# Patient Record
Sex: Female | Born: 1945
Health system: Southern US, Community
[De-identification: ages and names within clinical notes are randomized; demographics above are authoritative.]

## PROBLEM LIST (undated history)

## (undated) ENCOUNTER — Emergency Department (HOSPITAL_COMMUNITY): Payer: Medicare HMO

## (undated) DIAGNOSIS — I10 Essential (primary) hypertension: Secondary | ICD-10-CM

## (undated) DIAGNOSIS — E119 Type 2 diabetes mellitus without complications: Secondary | ICD-10-CM

## (undated) DIAGNOSIS — N189 Chronic kidney disease, unspecified: Secondary | ICD-10-CM

## (undated) DIAGNOSIS — I679 Cerebrovascular disease, unspecified: Secondary | ICD-10-CM

## (undated) DIAGNOSIS — C801 Malignant (primary) neoplasm, unspecified: Secondary | ICD-10-CM

## (undated) DIAGNOSIS — I499 Cardiac arrhythmia, unspecified: Secondary | ICD-10-CM

## (undated) DIAGNOSIS — M1712 Unilateral primary osteoarthritis, left knee: Secondary | ICD-10-CM

## (undated) DIAGNOSIS — N183 Chronic kidney disease, stage 3 unspecified: Secondary | ICD-10-CM

## (undated) DIAGNOSIS — I2699 Other pulmonary embolism without acute cor pulmonale: Secondary | ICD-10-CM

## (undated) DIAGNOSIS — R011 Cardiac murmur, unspecified: Secondary | ICD-10-CM

## (undated) DIAGNOSIS — G4733 Obstructive sleep apnea (adult) (pediatric): Secondary | ICD-10-CM

## (undated) DIAGNOSIS — N1832 Chronic kidney disease, stage 3b: Secondary | ICD-10-CM

## (undated) DIAGNOSIS — G4730 Sleep apnea, unspecified: Secondary | ICD-10-CM

## (undated) HISTORY — DX: Chronic kidney disease, stage 3 unspecified: N18.30

## (undated) HISTORY — PX: EYE SURGERY: SHX253

## (undated) HISTORY — DX: Cerebrovascular disease, unspecified: I67.9

## (undated) HISTORY — PX: BACK SURGERY: SHX140

## (undated) HISTORY — DX: Chronic kidney disease, stage 3b: N18.32

## (undated) HISTORY — PX: JOINT REPLACEMENT: SHX530

## (undated) HISTORY — PX: OTHER SURGICAL HISTORY: SHX169

## (undated) HISTORY — DX: Other pulmonary embolism without acute cor pulmonale: I26.99

## (undated) HISTORY — DX: Unilateral primary osteoarthritis, left knee: M17.12

## (undated) HISTORY — DX: Type 2 diabetes mellitus without complications: E11.9

## (undated) HISTORY — DX: Essential (primary) hypertension: I10

## (undated) HISTORY — DX: Obstructive sleep apnea (adult) (pediatric): G47.33

## (undated) HISTORY — PX: ABDOMINAL HYSTERECTOMY: SHX81

## (undated) HISTORY — DX: Sleep apnea, unspecified: G47.30

---

## 1999-01-24 ENCOUNTER — Encounter: Admission: RE | Admit: 1999-01-24 | Discharge: 1999-01-24 | Payer: Self-pay | Admitting: Family Medicine

## 1999-01-24 ENCOUNTER — Encounter: Payer: Self-pay | Admitting: Family Medicine

## 2000-09-09 ENCOUNTER — Encounter: Admission: RE | Admit: 2000-09-09 | Discharge: 2000-09-09 | Payer: Self-pay | Admitting: Obstetrics and Gynecology

## 2000-09-09 ENCOUNTER — Encounter: Payer: Self-pay | Admitting: Obstetrics and Gynecology

## 2001-07-14 ENCOUNTER — Encounter: Admission: RE | Admit: 2001-07-14 | Discharge: 2001-07-14 | Payer: Self-pay | Admitting: Family Medicine

## 2001-07-14 ENCOUNTER — Encounter: Payer: Self-pay | Admitting: Family Medicine

## 2004-05-08 ENCOUNTER — Inpatient Hospital Stay (HOSPITAL_COMMUNITY): Admission: RE | Admit: 2004-05-08 | Discharge: 2004-05-12 | Payer: Self-pay | Admitting: Orthopedic Surgery

## 2004-05-28 ENCOUNTER — Emergency Department (HOSPITAL_COMMUNITY): Admission: EM | Admit: 2004-05-28 | Discharge: 2004-05-28 | Payer: Self-pay | Admitting: Emergency Medicine

## 2004-05-29 ENCOUNTER — Ambulatory Visit (HOSPITAL_COMMUNITY): Admission: RE | Admit: 2004-05-29 | Discharge: 2004-05-29 | Payer: Self-pay | Admitting: Orthopedic Surgery

## 2008-12-13 ENCOUNTER — Inpatient Hospital Stay (HOSPITAL_COMMUNITY): Admission: RE | Admit: 2008-12-13 | Discharge: 2008-12-16 | Payer: Self-pay | Admitting: Orthopedic Surgery

## 2008-12-14 ENCOUNTER — Encounter (INDEPENDENT_AMBULATORY_CARE_PROVIDER_SITE_OTHER): Payer: Self-pay | Admitting: Orthopedic Surgery

## 2008-12-14 ENCOUNTER — Ambulatory Visit: Payer: Self-pay | Admitting: Surgery

## 2010-07-11 LAB — URINALYSIS, ROUTINE W REFLEX MICROSCOPIC
Bilirubin Urine: NEGATIVE
Glucose, UA: NEGATIVE mg/dL
Hgb urine dipstick: NEGATIVE
Ketones, ur: NEGATIVE mg/dL
Nitrite: POSITIVE — AB
Protein, ur: NEGATIVE mg/dL
Specific Gravity, Urine: 1.014 (ref 1.005–1.030)
Urobilinogen, UA: 0.2 mg/dL (ref 0.0–1.0)
pH: 6 (ref 5.0–8.0)

## 2010-07-11 LAB — COMPREHENSIVE METABOLIC PANEL
ALT: 21 U/L (ref 0–35)
AST: 26 U/L (ref 0–37)
Albumin: 3.5 g/dL (ref 3.5–5.2)
Alkaline Phosphatase: 52 U/L (ref 39–117)
BUN: 10 mg/dL (ref 6–23)
CO2: 24 mEq/L (ref 19–32)
Calcium: 9.2 mg/dL (ref 8.4–10.5)
Chloride: 105 mEq/L (ref 96–112)
Creatinine, Ser: 0.96 mg/dL (ref 0.4–1.2)
GFR calc Af Amer: >60 mL/min (ref >60)
GFR calc non Af Amer: 59 mL/min — ABNORMAL LOW (ref >60)
Glucose, Bld: 155 mg/dL — ABNORMAL HIGH (ref 70–99)
Potassium: 4 mEq/L (ref 3.5–5.1)
Sodium: 138 mEq/L (ref 135–145)
Total Bilirubin: 0.4 mg/dL (ref 0.3–1.2)
Total Protein: 6 g/dL (ref 6.0–8.3)

## 2010-07-11 LAB — URINE MICROSCOPIC-ADD ON

## 2010-07-11 LAB — BASIC METABOLIC PANEL
BUN: 5 mg/dL — ABNORMAL LOW (ref 6–23)
BUN: 6 mg/dL (ref 6–23)
BUN: 8 mg/dL (ref 6–23)
CO2: 25 mEq/L (ref 19–32)
CO2: 28 mEq/L (ref 19–32)
CO2: 30 mEq/L (ref 19–32)
Calcium: 8.4 mg/dL (ref 8.4–10.5)
Calcium: 8.6 mg/dL (ref 8.4–10.5)
Calcium: 9 mg/dL (ref 8.4–10.5)
Chloride: 100 mEq/L (ref 96–112)
Chloride: 96 mEq/L (ref 96–112)
Chloride: 99 mEq/L (ref 96–112)
Creatinine, Ser: 0.74 mg/dL (ref 0.4–1.2)
Creatinine, Ser: 0.74 mg/dL (ref 0.4–1.2)
Creatinine, Ser: 0.76 mg/dL (ref 0.4–1.2)
GFR calc Af Amer: >60 mL/min (ref >60)
GFR calc Af Amer: >60 mL/min (ref >60)
GFR calc Af Amer: >60 mL/min (ref >60)
GFR calc non Af Amer: >60 mL/min (ref >60)
GFR calc non Af Amer: >60 mL/min (ref >60)
GFR calc non Af Amer: >60 mL/min (ref >60)
Glucose, Bld: 147 mg/dL — ABNORMAL HIGH (ref 70–99)
Glucose, Bld: 162 mg/dL — ABNORMAL HIGH (ref 70–99)
Glucose, Bld: 166 mg/dL — ABNORMAL HIGH (ref 70–99)
Potassium: 4.2 mEq/L (ref 3.5–5.1)
Potassium: 4.3 mEq/L (ref 3.5–5.1)
Potassium: 4.6 mEq/L (ref 3.5–5.1)
Sodium: 131 mEq/L — ABNORMAL LOW (ref 135–145)
Sodium: 133 mEq/L — ABNORMAL LOW (ref 135–145)
Sodium: 135 mEq/L (ref 135–145)

## 2010-07-11 LAB — GLUCOSE, CAPILLARY
Glucose-Capillary: 118 mg/dL — ABNORMAL HIGH (ref 70–99)
Glucose-Capillary: 120 mg/dL — ABNORMAL HIGH (ref 70–99)
Glucose-Capillary: 121 mg/dL — ABNORMAL HIGH (ref 70–99)
Glucose-Capillary: 121 mg/dL — ABNORMAL HIGH (ref 70–99)
Glucose-Capillary: 129 mg/dL — ABNORMAL HIGH (ref 70–99)
Glucose-Capillary: 130 mg/dL — ABNORMAL HIGH (ref 70–99)
Glucose-Capillary: 138 mg/dL — ABNORMAL HIGH (ref 70–99)
Glucose-Capillary: 140 mg/dL — ABNORMAL HIGH (ref 70–99)
Glucose-Capillary: 142 mg/dL — ABNORMAL HIGH (ref 70–99)
Glucose-Capillary: 146 mg/dL — ABNORMAL HIGH (ref 70–99)
Glucose-Capillary: 148 mg/dL — ABNORMAL HIGH (ref 70–99)
Glucose-Capillary: 161 mg/dL — ABNORMAL HIGH (ref 70–99)
Glucose-Capillary: 168 mg/dL — ABNORMAL HIGH (ref 70–99)

## 2010-07-11 LAB — PROTIME-INR
INR: 0.9 (ref 0.00–1.49)
INR: 1.1 (ref 0.00–1.49)
INR: 2.5 — ABNORMAL HIGH (ref 0.00–1.49)
INR: 4 — ABNORMAL HIGH (ref 0.00–1.49)
Prothrombin Time: 11.9 seconds (ref 11.6–15.2)
Prothrombin Time: 14.2 seconds (ref 11.6–15.2)
Prothrombin Time: 26.4 seconds — ABNORMAL HIGH (ref 11.6–15.2)
Prothrombin Time: 38.9 seconds — ABNORMAL HIGH (ref 11.6–15.2)

## 2010-07-11 LAB — CBC
HCT: 27.6 % — ABNORMAL LOW (ref 36.0–46.0)
HCT: 29.1 % — ABNORMAL LOW (ref 36.0–46.0)
HCT: 31.3 % — ABNORMAL LOW (ref 36.0–46.0)
HCT: 38.6 % (ref 36.0–46.0)
Hemoglobin: 10.3 g/dL — ABNORMAL LOW (ref 12.0–15.0)
Hemoglobin: 12.8 g/dL (ref 12.0–15.0)
Hemoglobin: 9.3 g/dL — ABNORMAL LOW (ref 12.0–15.0)
Hemoglobin: 9.7 g/dL — ABNORMAL LOW (ref 12.0–15.0)
MCHC: 32.9 g/dL (ref 30.0–36.0)
MCHC: 33.1 g/dL (ref 30.0–36.0)
MCHC: 33.2 g/dL (ref 30.0–36.0)
MCHC: 33.6 g/dL (ref 30.0–36.0)
MCV: 87.4 fL (ref 78.0–100.0)
MCV: 87.9 fL (ref 78.0–100.0)
MCV: 88.3 fL (ref 78.0–100.0)
MCV: 88.8 fL (ref 78.0–100.0)
Platelets: 209 10*3/uL (ref 150–400)
Platelets: 210 10*3/uL (ref 150–400)
Platelets: 223 10*3/uL (ref 150–400)
Platelets: 285 10*3/uL (ref 150–400)
RBC: 3.16 MIL/uL — ABNORMAL LOW (ref 3.87–5.11)
RBC: 3.28 MIL/uL — ABNORMAL LOW (ref 3.87–5.11)
RBC: 3.55 MIL/uL — ABNORMAL LOW (ref 3.87–5.11)
RBC: 4.39 MIL/uL (ref 3.87–5.11)
RDW: 14.9 % (ref 11.5–15.5)
RDW: 15.3 % (ref 11.5–15.5)
RDW: 15.6 % — ABNORMAL HIGH (ref 11.5–15.5)
RDW: 15.6 % — ABNORMAL HIGH (ref 11.5–15.5)
WBC: 10.5 10*3/uL (ref 4.0–10.5)
WBC: 10.6 10*3/uL — ABNORMAL HIGH (ref 4.0–10.5)
WBC: 11 10*3/uL — ABNORMAL HIGH (ref 4.0–10.5)
WBC: 9.3 10*3/uL (ref 4.0–10.5)

## 2010-07-11 LAB — APTT: aPTT: 37 seconds (ref 24–37)

## 2010-08-22 NOTE — Discharge Summary (Signed)
Lisa Maxwell, Lisa Maxwell                ACCOUNT NO.:  [id number]   MEDICAL RECORD NO.:  [id number]          PATIENT TYPE:  INP   LOCATION:  [place]                         FACILITY:  [facility]   PHYSICIAN:  Metta Clines. Supple, M.D.  DATE OF BIRTH:  06/09/1945   DATE OF ADMISSION:  05/08/2004  DATE OF DISCHARGE:  05/12/2004                                 DISCHARGE SUMMARY   ADMISSION DIAGNOSES:  1.  End-stage osteoarthritis of right knee.  2.  Diet-controlled diabetes.  3.  Reflux.  4.  History of heart murmur.   DISCHARGE DIAGNOSES:  1.  End-stage osteoarthritis of right knee.  2.  Diet-controlled diabetes.  3.  Reflux.  4.  History of heart murmur.  5.  Status post right total knee arthroplasty.  6.  Postoperative hyponatremia, likely volume induced, and this did improve.   OPERATION:  Right total knee arthroplasty. Surgeon Metta Clines. Supple, M.D.  Assistant Olivia Mackie A. Shuford, P.A.-C. under general anesthetic.   BRIEF HISTORY:  Lisa Maxwell is a very pleasant 65 year old female well known  to [place]. Has failed outpatient conservative management including knee  arthroscopy and multiple NSAIDs and injections for known end-stage  osteoarthritis of her right knee. At this time, she is quite miserable and  wishes to proceed with total knee arthroplasty. Risks and benefits  discussed. In spite of her young age at this time, decision was made to  proceed. The risks and benefits include neurovascular injury, bleeding,  infection, need for additional surgeries as well as DVT, PE, and anesthetic  complications were discussed at this time, and she wished to proceed.   HOSPITAL COURSE:  The patient was admitted and underwent the above mentioned  procedure and tolerated thi well. All appropriate IV antibiotics and  analgesics were utilized postoperatively. Postoperatively, she was placed on  Coumadin for DVT and PE prophylaxis. Her first postoperative night, she was  noted to have decreased urine output as  well as some mild rhonchi in her  lung bases. We did get a chest x-ray. She was found to be mildly  hyponatremic as well which was likely volume induced. She did respond nicely  to albuterol nebulizers in regards to her respiratory status. Her chest x-  ray was found to show minimal fluid or atelectasis at that time. Overall,  the patient did extremely well postoperatively. She began working with  therapy and did well. Home health therapy as well as RN case management  became involved. All home needs were met. By date May 12, 2004,  postoperative day #4, she was doing extremely well. Anesthesia was clean and  dry. She was afebrile. She had no specific complaints and had progressed  towards her therapy goal. At this time, she was stable for discharge home to  follow up on an outpatient basis.   LABORATORY DATA:  Shows admission hemogram within normal limits.  Postoperatively, she dropped to 10.8 and 9.4, and the last blood draw prior  to discharge was 10.3. Protimes and INRs followed by pharmacy for DVT and PE  prophylaxis on Coumadin. Chemistries showed admission sodium of 134,  postoperatively 129, the following day up to 133. Other chemistries showed  her glucose to be fluctuating between 143 and 181. She was noted to have a  UTI on date May 05, 2004, positive nitrites, small leukocyte esterase.  She was given the Ancef perioperatively and remained asymptomatic. EKG  showed a right bundle branch block with normal sinus rhythm and frequent  premature supraventricular complexes. There was no tracing to be compared  to.   CONDITION ON DISCHARGE:  Stable and improved.   DISCHARGE MEDICATIONS AND PLAN:  The patient will be discharged to home. She  will be followed up in our office in two weeks ________________ .  Prescriptions have been provided for Percocet, Robaxin, and Coumadin.  [facility] OT/PT will be arranged for home. Resume her other home  medications and diet.  Call for any difficulties.      TAS/MEDQ  D:  07/01/2004  T:  07/01/2004  Job:  [id number]

## 2010-08-22 NOTE — Op Note (Signed)
Lisa Maxwell, OTERO                ACCOUNT NO.:  [id number]   MEDICAL RECORD NO.:  [id number]          PATIENT TYPE:  INP   LOCATION:  X001                         FACILITY:  [facility]   PHYSICIAN:  Metta Clines. Supple, M.D.  DATE OF BIRTH:  [birth year]   DATE OF PROCEDURE:  05/08/2004  DATE OF DISCHARGE:                                 OPERATIVE REPORT   PREOPERATIVE DIAGNOSIS:  End-stage right knee osteoarthrosis.   POSTOPERATIVE DIAGNOSIS:  End-stage right knee osteoarthrosis.   PROCEDURE:  Cemented right [name] posterior stabilized total knee  implant with a #2 femur, #2 tibia, a 32-mm patella, and a 12.5-mm rotating  platform polyethylene insert.   SURGEON:  Metta Clines. Supple, M.D.   Terrence DupontOlivia Mackie A. Shuford, P.A.-C.   ANESTHESIA:  General endotracheal.   TOURNIQUET TIME:  1 hour and 7 minutes.   ESTIMATED BLOOD LOSS:  250 cc.   DRAINS:  Hemovac x1.   INDICATIONS FOR PROCEDURE:  Lisa Maxwell is a 65 year old female who has had  chronic right knee pain related to end-stage arthrosis which has been  refractory to prolonged attempts at conservative management.  Due to her  increasing pain and functional limitations, she is brought to the operating  room at this time for planned right total knee arthroplasty, as described  below.   Preoperatively, [name] was counseled on the treatment options as well  as risks versus benefits thereof.  Possible surgical complications of  bleeding, infection, neurovascular injury, DVT, PE, persistence of pain,  loss of motion, and potential need for revision of the implant were all  reviewed.  She understands and accepts and agrees with our planned  procedure.   DESCRIPTION OF PROCEDURE:  After undergoing routine preoperative evaluation,  the patient received prophylactic antibiotics.  She was placed supine on the  operating table and underwent smooth induction of general endotracheal  anesthesia.  A Foley catheter was placed.  A  tourniquet was applied to the  right thigh, and the right leg was sterilely prepped and draped in standard  fashion.  The leg was exsanguinated with the tourniquet inflated to 350 mmHg  initially.  Throughout the case, however, we noted persistent bleeding from  the medullary canal, and at the halfway point, the tourniquet was increased  to 400 mmHg.   An anterior midline incision was then made from approximately four  fingerbreadths above the patella to just medial to the tibial tubercle to a  length of approximately 20 cm.  Skin flaps were mobilized and elevated and  electrocautery used for hemostasis.  The skin flaps were tied back.  A  medial parapatellar arthrotomy was then performed with electrocautery.  The  patella was everted.  The infrapatellar fat pad was excised.  A very minimal  medial release was performed.  She did have a slight valgus alignment.  The  cruciate ligaments were divided and excised.  The remnants of the menisci  were removed.  A rongeur was used to gain access to the starting point in  the femoral canal, and the drill was then directed into the  femoral canal  with the guide finder and then with the intermedullary canal finder passed  followed by an intermedullary guide.  We made a 5-degree valgus cut,  removing 11 mm from the distal femur.  This was then incised, and the size 2  had the best fit.  The size 2 cutting guide was then placed into position,  and the oscillating saw was then used to make the anterior, posterior, and  chamfer cuts on the distal femur.  McHale retractors were then placed, and  the proximal tibia was exposed.  An extramedullary guide was then used to  make a neutral cut on the proximal tibia, removing 10 mm of bone from the  medial tibial plateau.  The proximal tibia was incised to a size 2.  This  was then temporarily pinned into position, and the trial implants were then  placed.  We showed excellent knee motion with excellent  stability and good  soft tissue balance.  The proximal tibia was then re-exposed, and we used  the reamer to complete the tibial preparation followed by the keel cutting  broach.   Our attention was then redirected to the distal femur where the box cutting  guide was pinned into position, and the oscillating saw was then used to  make the box cut on the distal femur.  We then used an osteotome to remove  osteophytes from the posterior femoral condyles.  All residual soft tissue  in the intercondylar notch and the remnants of the cruciate ligaments were  removed.  A slight lateral release was performed off of the distal femur,  providing a symmetric extension gap and good soft tissue balance.   Attention was then turned to the patella which had best fit with a 32-mm  button.  The peripheral soft tissues were removed with electrocautery, and  then the oscillating saw was used to make a transverse cut across the  patella, removing 8 mm of bone.  The stabilizing drill holes were then  drilled.  At this point, pulsatile lavage was then used to meticulously  clean the knee joint.  All surfaces were then dried.  Cement was then mixed  on the back table and when at the appropriate consistency, the implants were  cemented into position beginning with the tibia and then the femur and then  the patella.  Meticulous removal of all extra cement was completed.  Once  the cement had hardened, final debridement of cement was completed.  The  knee was then taken through a range of motion.  We tried the 10 and 12.5  inserts, and the 12.5 had the best soft tissue balance with full extension  achieved in the knee.  The final 12.5-mm rotating platform insert was then  opened.  The knee joint was terminally cleaned.  The tibial insert was  placed into position.  The knee was again taken through a range of motion, showing excellent stability and normal patellar tracking.  A Hemovac drain  was then brought out  laterally.  The tourniquet was let down.  Hemostasis  was obtained.  The parapatellar arthrotomy was closed with a series of  figure-of-eight #1 Vicryl sutures.  A 2-0 Vicryl was used for the  subcutaneous tissue, and an intra-articular Monocryl was used for the skin  followed by Steri-Strips.  A dry dressing was then applied.  A knee  immobilizer and ice pack were placed on the right lower extremity.   The patient was then extubated and taken  to the recovery room in stable  condition.      KMS/MEDQ  D:  05/08/2004  T:  05/08/2004  Job:  [id number]

## 2014-05-08 DIAGNOSIS — K625 Hemorrhage of anus and rectum: Secondary | ICD-10-CM | POA: Diagnosis not present

## 2014-05-08 DIAGNOSIS — I4891 Unspecified atrial fibrillation: Secondary | ICD-10-CM | POA: Diagnosis not present

## 2014-05-08 DIAGNOSIS — D500 Iron deficiency anemia secondary to blood loss (chronic): Secondary | ICD-10-CM | POA: Diagnosis not present

## 2014-05-18 DIAGNOSIS — D631 Anemia in chronic kidney disease: Secondary | ICD-10-CM | POA: Diagnosis not present

## 2014-05-18 DIAGNOSIS — I129 Hypertensive chronic kidney disease with stage 1 through stage 4 chronic kidney disease, or unspecified chronic kidney disease: Secondary | ICD-10-CM | POA: Diagnosis not present

## 2014-05-18 DIAGNOSIS — N183 Chronic kidney disease, stage 3 (moderate): Secondary | ICD-10-CM | POA: Diagnosis not present

## 2014-05-18 DIAGNOSIS — N2581 Secondary hyperparathyroidism of renal origin: Secondary | ICD-10-CM | POA: Diagnosis not present

## 2014-05-18 DIAGNOSIS — N189 Chronic kidney disease, unspecified: Secondary | ICD-10-CM | POA: Diagnosis not present

## 2014-05-25 DIAGNOSIS — I129 Hypertensive chronic kidney disease with stage 1 through stage 4 chronic kidney disease, or unspecified chronic kidney disease: Secondary | ICD-10-CM | POA: Diagnosis not present

## 2014-05-25 DIAGNOSIS — N183 Chronic kidney disease, stage 3 (moderate): Secondary | ICD-10-CM | POA: Diagnosis not present

## 2014-05-31 DIAGNOSIS — G4730 Sleep apnea, unspecified: Secondary | ICD-10-CM | POA: Diagnosis not present

## 2014-05-31 DIAGNOSIS — E669 Obesity, unspecified: Secondary | ICD-10-CM | POA: Diagnosis not present

## 2014-05-31 DIAGNOSIS — K648 Other hemorrhoids: Secondary | ICD-10-CM | POA: Diagnosis not present

## 2014-05-31 DIAGNOSIS — E1129 Type 2 diabetes mellitus with other diabetic kidney complication: Secondary | ICD-10-CM | POA: Diagnosis not present

## 2014-05-31 DIAGNOSIS — K624 Stenosis of anus and rectum: Secondary | ICD-10-CM | POA: Diagnosis not present

## 2014-05-31 DIAGNOSIS — E785 Hyperlipidemia, unspecified: Secondary | ICD-10-CM | POA: Diagnosis not present

## 2014-05-31 DIAGNOSIS — Z87891 Personal history of nicotine dependence: Secondary | ICD-10-CM | POA: Diagnosis not present

## 2014-05-31 DIAGNOSIS — K5730 Diverticulosis of large intestine without perforation or abscess without bleeding: Secondary | ICD-10-CM | POA: Diagnosis not present

## 2014-05-31 DIAGNOSIS — K219 Gastro-esophageal reflux disease without esophagitis: Secondary | ICD-10-CM | POA: Diagnosis not present

## 2014-05-31 DIAGNOSIS — I10 Essential (primary) hypertension: Secondary | ICD-10-CM | POA: Diagnosis not present

## 2014-05-31 DIAGNOSIS — Z6841 Body Mass Index (BMI) 40.0 and over, adult: Secondary | ICD-10-CM | POA: Diagnosis not present

## 2014-05-31 DIAGNOSIS — I4891 Unspecified atrial fibrillation: Secondary | ICD-10-CM | POA: Diagnosis not present

## 2014-05-31 DIAGNOSIS — M179 Osteoarthritis of knee, unspecified: Secondary | ICD-10-CM | POA: Diagnosis not present

## 2014-05-31 DIAGNOSIS — D500 Iron deficiency anemia secondary to blood loss (chronic): Secondary | ICD-10-CM | POA: Diagnosis not present

## 2014-05-31 DIAGNOSIS — K625 Hemorrhage of anus and rectum: Secondary | ICD-10-CM | POA: Diagnosis not present

## 2014-05-31 DIAGNOSIS — I519 Heart disease, unspecified: Secondary | ICD-10-CM | POA: Diagnosis not present

## 2014-05-31 DIAGNOSIS — G4733 Obstructive sleep apnea (adult) (pediatric): Secondary | ICD-10-CM | POA: Diagnosis not present

## 2014-05-31 DIAGNOSIS — I2510 Atherosclerotic heart disease of native coronary artery without angina pectoris: Secondary | ICD-10-CM | POA: Diagnosis not present

## 2014-05-31 DIAGNOSIS — K649 Unspecified hemorrhoids: Secondary | ICD-10-CM | POA: Diagnosis not present

## 2014-06-01 DIAGNOSIS — Z6841 Body Mass Index (BMI) 40.0 and over, adult: Secondary | ICD-10-CM | POA: Diagnosis not present

## 2014-06-01 DIAGNOSIS — I10 Essential (primary) hypertension: Secondary | ICD-10-CM | POA: Diagnosis not present

## 2014-06-01 DIAGNOSIS — E1129 Type 2 diabetes mellitus with other diabetic kidney complication: Secondary | ICD-10-CM | POA: Diagnosis not present

## 2014-06-01 DIAGNOSIS — R609 Edema, unspecified: Secondary | ICD-10-CM | POA: Diagnosis not present

## 2014-11-26 ENCOUNTER — Other Ambulatory Visit (HOSPITAL_COMMUNITY): Payer: Self-pay | Admitting: Nephrology

## 2014-11-26 DIAGNOSIS — N183 Chronic kidney disease, stage 3 unspecified: Secondary | ICD-10-CM

## 2014-12-03 ENCOUNTER — Other Ambulatory Visit: Payer: Self-pay | Admitting: Physician Assistant

## 2014-12-04 ENCOUNTER — Ambulatory Visit (HOSPITAL_COMMUNITY)
Admission: RE | Admit: 2014-12-04 | Discharge: 2014-12-04 | Disposition: A | Discharge status: Home / Self Care | Payer: Medicare Other | Source: Ambulatory Visit | Attending: Nephrology | Admitting: Nephrology

## 2014-12-04 DIAGNOSIS — Z7982 Long term (current) use of aspirin: Secondary | ICD-10-CM | POA: Insufficient documentation

## 2014-12-04 DIAGNOSIS — N289 Disorder of kidney and ureter, unspecified: Secondary | ICD-10-CM | POA: Diagnosis present

## 2014-12-04 DIAGNOSIS — N1832 Chronic kidney disease, stage 3b: Secondary | ICD-10-CM | POA: Insufficient documentation

## 2014-12-04 DIAGNOSIS — E669 Obesity, unspecified: Secondary | ICD-10-CM | POA: Diagnosis not present

## 2014-12-04 DIAGNOSIS — Z79899 Other long term (current) drug therapy: Secondary | ICD-10-CM | POA: Diagnosis not present

## 2014-12-04 DIAGNOSIS — G4733 Obstructive sleep apnea (adult) (pediatric): Secondary | ICD-10-CM | POA: Insufficient documentation

## 2014-12-04 DIAGNOSIS — I4891 Unspecified atrial fibrillation: Secondary | ICD-10-CM | POA: Insufficient documentation

## 2014-12-04 DIAGNOSIS — I679 Cerebrovascular disease, unspecified: Secondary | ICD-10-CM | POA: Diagnosis not present

## 2014-12-04 DIAGNOSIS — N183 Chronic kidney disease, stage 3 unspecified: Secondary | ICD-10-CM | POA: Insufficient documentation

## 2014-12-04 DIAGNOSIS — I10 Essential (primary) hypertension: Secondary | ICD-10-CM | POA: Insufficient documentation

## 2014-12-04 DIAGNOSIS — E119 Type 2 diabetes mellitus without complications: Secondary | ICD-10-CM | POA: Insufficient documentation

## 2014-12-04 DIAGNOSIS — Z6838 Body mass index (BMI) 38.0-38.9, adult: Secondary | ICD-10-CM | POA: Diagnosis not present

## 2014-12-04 DIAGNOSIS — Z794 Long term (current) use of insulin: Secondary | ICD-10-CM | POA: Insufficient documentation

## 2014-12-04 LAB — CBC
HCT: 34.1 % — ABNORMAL LOW (ref 36.0–46.0)
Hemoglobin: 10.8 g/dL — ABNORMAL LOW (ref 12.0–15.0)
MCH: 28.9 pg (ref 26.0–34.0)
MCHC: 31.7 g/dL (ref 30.0–36.0)
MCV: 91.2 fL (ref 78.0–100.0)
Platelets: 292 10*3/uL (ref 150–400)
RBC: 3.74 MIL/uL — ABNORMAL LOW (ref 3.87–5.11)
RDW: 14.7 % (ref 11.5–15.5)
WBC: 7.2 10*3/uL (ref 4.0–10.5)

## 2014-12-04 LAB — PROTIME-INR
INR: 1.16 (ref 0.00–1.49)
Prothrombin Time: 15 seconds (ref 11.6–15.2)

## 2014-12-04 LAB — APTT: aPTT: 34 seconds (ref 24–37)

## 2014-12-04 LAB — GLUCOSE, CAPILLARY
Glucose-Capillary: 136 mg/dL — ABNORMAL HIGH (ref 65–99)
Glucose-Capillary: 105 mg/dL — ABNORMAL HIGH (ref 65–99)

## 2014-12-04 MED ORDER — FENTANYL CITRATE (PF) 100 MCG/2ML IJ SOLN
INTRAMUSCULAR | Status: AC
  Filled 2014-12-04: qty 2

## 2014-12-04 MED ORDER — SODIUM CHLORIDE 0.9 % IV SOLN
INTRAVENOUS | Status: DC
  Administered 2014-12-04: 09:00:00 via INTRAVENOUS

## 2014-12-04 MED ORDER — MIDAZOLAM HCL 2 MG/2ML IJ SOLN
INTRAMUSCULAR | Status: AC
  Filled 2014-12-04: qty 2

## 2014-12-04 MED ORDER — MIDAZOLAM HCL 2 MG/2ML IJ SOLN
INTRAMUSCULAR | Status: AC | PRN
  Administered 2014-12-04: 0.5 mg via INTRAVENOUS
  Administered 2014-12-04: 1 mg via INTRAVENOUS

## 2014-12-04 MED ORDER — FENTANYL CITRATE (PF) 100 MCG/2ML IJ SOLN
INTRAMUSCULAR | Status: AC | PRN
  Administered 2014-12-04: 25 ug via INTRAVENOUS
  Administered 2014-12-04: 50 ug via INTRAVENOUS

## 2014-12-04 MED ORDER — LIDOCAINE HCL (PF) 1 % IJ SOLN
INTRAMUSCULAR | Status: AC
  Filled 2014-12-04: qty 10

## 2014-12-04 NOTE — H&P (Signed)
Chief Complaint: Patient was seen in consultation today for  US guided random renal biopsy  Referring Physician(s): Patel,Jay  History of Present Illness: Lisa Maxwell is a 69 y.o. female with PMH significant for DM,HTN, cerebrovascular disease, OA, obesity, afib on Pradaxa, OSA on CPAP and rising creatinine levels of unknown etiology who presents today for US guided random renal biopsy for further evaluation.   No past medical history on file. see above  No past surgical history on file. shoulder surgery/ rt knee surgery  Allergies: Review of patient's allergies indicates no known allergies.  Medications: Prior to Admission medications   Medication Sig Start Date End Date Taking? Authorizing Provider  aspirin 81 MG tablet Take 81 mg by mouth daily.   Yes Historical Provider, MD  carvedilol (COREG) 12.5 MG tablet Take 12.5 mg by mouth 2 (two) times daily with a meal.   Yes Historical Provider, MD  cetirizine (ZYRTEC) 10 MG tablet Take 10 mg by mouth daily.   Yes Historical Provider, MD  cholecalciferol (VITAMIN D) 1000 UNITS tablet Take 1,000 Units by mouth daily.   Yes Historical Provider, MD  dabigatran (PRADAXA) 150 MG CAPS capsule Take 150 mg by mouth 2 (two) times daily.   Yes Historical Provider, MD  fenofibrate 160 MG tablet Take 160 mg by mouth daily.   Yes Historical Provider, MD  ferrous sulfate 325 (65 FE) MG tablet Take 325 mg by mouth daily with breakfast.   Yes Historical Provider, MD  furosemide (LASIX) 40 MG tablet Take 40 mg by mouth.   Yes Historical Provider, MD  glipiZIDE (GLUCOTROL XL) 2.5 MG 24 hr tablet Take 2.5 mg by mouth daily with breakfast.   Yes Historical Provider, MD  latanoprost (XALATAN) 0.005 % ophthalmic solution Place 1 drop into both eyes at bedtime.   Yes Historical Provider, MD  losartan (COZAAR) 50 MG tablet Take 50 mg by mouth daily.   Yes Historical Provider, MD  lovastatin (MEVACOR) 20 MG tablet Take 20 mg by mouth at bedtime.   Yes  Historical Provider, MD  nystatin cream (MYCOSTATIN) Apply 1 application topically 2 (two) times daily as needed for dry skin.   Yes Historical Provider, MD  omeprazole (PRILOSEC) 40 MG capsule Take 40 mg by mouth daily.   Yes Historical Provider, MD  spironolactone (ALDACTONE) 25 MG tablet Take 25 mg by mouth daily.   Yes Historical Provider, MD     No family history on file.  Social History   Social History  . Marital Status: Married    Spouse Name: N/A  . Number of Children: N/A  . Years of Education: N/A   Social History Main Topics  . Smoking status: Not on file  . Smokeless tobacco: Not on file  . Alcohol Use: Not on file  . Drug Use: Not on file  . Sexual Activity: Not on file   Other Topics Concern  . Not on file   Social History Narrative  . No narrative on file      Review of Systems   Constitutional: Negative for fever and chills.  Respiratory: Positive for shortness of breath. Negative for cough.   Cardiovascular: Negative for chest pain.  Gastrointestinal: Negative for nausea, vomiting, abdominal pain and blood in stool.  Genitourinary: Negative for dysuria and hematuria.  Musculoskeletal: Positive for back pain and arthralgias.  Neurological: Negative for headaches.     Vital Signs: BP 131/62 mmHg  Pulse 89  Temp(Src) 97.7 F (36.5 C)  Resp 18  Ht 4\' 10"  (1.473 m)  Wt 185 lb (83.915 kg)  BMI 38.68 kg/m2  SpO2 100%  Physical Exam  Constitutional: She is oriented to person, place, and time. She appears well-developed and well-nourished.  Cardiovascular: Normal rate.   Distant S1/S2, faint murmur  Pulmonary/Chest: Effort normal and breath sounds normal.  Abdominal: Soft. Bowel sounds are normal. There is no tenderness.  obese  Musculoskeletal: She exhibits edema.  Neurological: She is alert and oriented to person, place, and time.    Mallampati Score:     Imaging: No results found.  Labs:  CBC:  Recent Labs  12/04/14 0856  WBC  7.2  HGB 10.8*  HCT 34.1*  PLT 292    COAGS:  Recent Labs  12/04/14 0856  INR 1.16  APTT 34    BMP: No results for input(s): NA, K, CL, CO2, GLUCOSE, BUN, CALCIUM, CREATININE, GFRNONAA, GFRAA in the last 8760 hours.  Invalid input(s): CMP  LIVER FUNCTION TESTS: No results for input(s): BILITOT, AST, ALT, ALKPHOS, PROT, ALBUMIN in the last 8760 hours.  TUMOR MARKERS: No results for input(s): AFPTM, CEA, CA199, CHROMGRNA in the last 8760 hours.  Assessment and Plan: Lisa Maxwell is a 69 y.o. female with PMH significant for DM,HTN, cerebrovascular disease, OA, obesity, afib on Pradaxa, OSA on CPAP and rising creatinine levels of unknown etiology who presents today for US guided random renal biopsy for further evaluation. Risks and benefits discussed with the patient/family including, but not limited to bleeding, infection, damage to adjacent structures or low yield requiring additional tests.All of the patient's questions were answered, patient is agreeable to proceed.Consent signed and in chart.     Thank you for this interesting consult.  I greatly enjoyed meeting Lisa Maxwell and look forward to participating in their care.  A copy of this report was sent to the requesting provider on this date.  Signed: D. Rowe Robert 12/04/2014, 9:32 AM   I spent a total of 30 minutes in face to face in clinical consultation, greater than 50% of which was counseling/coordinating care for US guided random renal biopsy

## 2014-12-04 NOTE — Discharge Instructions (Signed)
Liver Biopsy, Care After °Refer to this sheet in the next few weeks. These instructions provide you with information on caring for yourself after your procedure. Your health care provider may also give you more specific instructions. Your treatment has been planned according to current medical practices, but problems sometimes occur. Call your health care provider if you have any problems or questions after your procedure. °WHAT TO EXPECT AFTER THE PROCEDURE °After your procedure, it is typical to have the following: °· A small amount of discomfort in the area where the biopsy was done and in the right shoulder or shoulder blade. °· A small amount of bruising around the area where the biopsy was done and on the skin over the liver. °· Sleepiness and fatigue for the rest of the day. °HOME CARE INSTRUCTIONS  °· Rest at home for 1-2 days or as directed by your health care provider. °· Have a friend or family member stay with you for at least 24 hours. °· Because of the medicines used during the procedure, you should not do the following things in the first 24 hours: °¨ Drive. °¨ Use machinery. °¨ Be responsible for the care of other people. °¨ Sign legal documents. °¨ Take a bath or shower. °· There are many different ways to close and cover an incision, including stitches, skin glue, and adhesive strips. Follow your health care provider's instructions on: °¨ Incision care. °¨ Bandage (dressing) changes and removal. °¨ Incision closure removal. °· Do not drink alcohol in the first week. °· Do not lift more than 5 pounds or play contact sports for 2 weeks after this test. °· Take medicines only as directed by your health care provider. Do not take medicine containing aspirin or non-steroidal anti-inflammatory medicines such as ibuprofen for 1 week after this test. °· It is your responsibility to get your test results. °SEEK MEDICAL CARE IF:  °· You have increased bleeding from an incision that results in more than a  small spot of blood. °· You have redness, swelling, or increasing pain in any incisions. °· You notice a discharge or a bad smell coming from any of your incisions. °· You have a fever or chills. °SEEK IMMEDIATE MEDICAL CARE IF:  °· You develop swelling, bloating, or pain in your abdomen. °· You become dizzy or faint. °· You develop a rash. °· You are nauseous or vomit. °· You have difficulty breathing, feel short of breath, or feel faint. °· You develop chest pain. °· You have problems with your speech or vision. °· You have trouble balancing or moving your arms or legs. °Document Released: 10/10/2004 Document Revised: 08/07/2013 Document Reviewed: 05/19/2013 °[employer]® Patient Information ©2015 [employer]. This information is not intended to replace advice given to you by your health care provider. Make sure you discuss any questions you have with your health care provider. ° °

## 2014-12-04 NOTE — Sedation Documentation (Signed)
Patient denies pain and is resting comfortably.  

## 2014-12-04 NOTE — Procedures (Signed)
L renal random core biopsy 16 g times two No comp/EBL

## 2014-12-14 ENCOUNTER — Encounter (HOSPITAL_COMMUNITY): Payer: Self-pay

## 2014-12-20 ENCOUNTER — Encounter (HOSPITAL_COMMUNITY): Payer: Self-pay

## 2015-04-30 DIAGNOSIS — N289 Disorder of kidney and ureter, unspecified: Secondary | ICD-10-CM

## 2015-04-30 DIAGNOSIS — I480 Paroxysmal atrial fibrillation: Secondary | ICD-10-CM

## 2015-04-30 DIAGNOSIS — I4891 Unspecified atrial fibrillation: Secondary | ICD-10-CM

## 2015-04-30 HISTORY — DX: Paroxysmal atrial fibrillation: I48.0

## 2015-04-30 HISTORY — DX: Unspecified atrial fibrillation: I48.91

## 2015-04-30 HISTORY — DX: Disorder of kidney and ureter, unspecified: N28.9

## 2016-09-23 DIAGNOSIS — Z961 Presence of intraocular lens: Secondary | ICD-10-CM | POA: Diagnosis not present

## 2016-09-23 DIAGNOSIS — H04123 Dry eye syndrome of bilateral lacrimal glands: Secondary | ICD-10-CM | POA: Diagnosis not present

## 2016-09-23 DIAGNOSIS — E119 Type 2 diabetes mellitus without complications: Secondary | ICD-10-CM | POA: Diagnosis not present

## 2016-09-23 DIAGNOSIS — H40013 Open angle with borderline findings, low risk, bilateral: Secondary | ICD-10-CM | POA: Diagnosis not present

## 2016-09-23 DIAGNOSIS — H1851 Endothelial corneal dystrophy: Secondary | ICD-10-CM | POA: Diagnosis not present

## 2016-10-06 DIAGNOSIS — G4733 Obstructive sleep apnea (adult) (pediatric): Secondary | ICD-10-CM | POA: Diagnosis not present

## 2016-10-12 DIAGNOSIS — E1122 Type 2 diabetes mellitus with diabetic chronic kidney disease: Secondary | ICD-10-CM | POA: Diagnosis not present

## 2016-10-12 DIAGNOSIS — E559 Vitamin D deficiency, unspecified: Secondary | ICD-10-CM | POA: Diagnosis not present

## 2016-10-12 DIAGNOSIS — N183 Chronic kidney disease, stage 3 (moderate): Secondary | ICD-10-CM | POA: Diagnosis not present

## 2016-10-12 DIAGNOSIS — R05 Cough: Secondary | ICD-10-CM | POA: Diagnosis not present

## 2016-10-12 DIAGNOSIS — E7800 Pure hypercholesterolemia, unspecified: Secondary | ICD-10-CM | POA: Diagnosis not present

## 2016-10-12 DIAGNOSIS — I1310 Hypertensive heart and chronic kidney disease without heart failure, with stage 1 through stage 4 chronic kidney disease, or unspecified chronic kidney disease: Secondary | ICD-10-CM | POA: Diagnosis not present

## 2016-10-12 DIAGNOSIS — D509 Iron deficiency anemia, unspecified: Secondary | ICD-10-CM | POA: Diagnosis not present

## 2016-10-12 DIAGNOSIS — J449 Chronic obstructive pulmonary disease, unspecified: Secondary | ICD-10-CM | POA: Diagnosis not present

## 2016-10-20 DIAGNOSIS — Z1231 Encounter for screening mammogram for malignant neoplasm of breast: Secondary | ICD-10-CM | POA: Diagnosis not present

## 2016-10-28 DIAGNOSIS — I1310 Hypertensive heart and chronic kidney disease without heart failure, with stage 1 through stage 4 chronic kidney disease, or unspecified chronic kidney disease: Secondary | ICD-10-CM | POA: Diagnosis not present

## 2016-10-28 DIAGNOSIS — J449 Chronic obstructive pulmonary disease, unspecified: Secondary | ICD-10-CM | POA: Diagnosis not present

## 2016-10-28 DIAGNOSIS — N183 Chronic kidney disease, stage 3 (moderate): Secondary | ICD-10-CM | POA: Diagnosis not present

## 2016-10-28 DIAGNOSIS — E1122 Type 2 diabetes mellitus with diabetic chronic kidney disease: Secondary | ICD-10-CM | POA: Diagnosis not present

## 2016-10-28 DIAGNOSIS — K219 Gastro-esophageal reflux disease without esophagitis: Secondary | ICD-10-CM | POA: Diagnosis not present

## 2016-11-04 ENCOUNTER — Encounter: Payer: Self-pay | Admitting: Cardiology

## 2016-11-04 ENCOUNTER — Ambulatory Visit (INDEPENDENT_AMBULATORY_CARE_PROVIDER_SITE_OTHER): Payer: Medicare Other | Admitting: Cardiology

## 2016-11-04 DIAGNOSIS — I2510 Atherosclerotic heart disease of native coronary artery without angina pectoris: Secondary | ICD-10-CM

## 2016-11-04 DIAGNOSIS — I10 Essential (primary) hypertension: Secondary | ICD-10-CM

## 2016-11-04 DIAGNOSIS — E782 Mixed hyperlipidemia: Secondary | ICD-10-CM | POA: Diagnosis not present

## 2016-11-04 DIAGNOSIS — E088 Diabetes mellitus due to underlying condition with unspecified complications: Secondary | ICD-10-CM | POA: Diagnosis not present

## 2016-11-04 HISTORY — DX: Atherosclerotic heart disease of native coronary artery without angina pectoris: I25.10

## 2016-11-04 HISTORY — DX: Diabetes mellitus due to underlying condition with unspecified complications: E08.8

## 2016-11-04 HISTORY — DX: Essential (primary) hypertension: I10

## 2016-11-04 HISTORY — DX: Morbid (severe) obesity due to excess calories: E66.01

## 2016-11-04 HISTORY — DX: Mixed hyperlipidemia: E78.2

## 2016-11-04 MED ORDER — NITROGLYCERIN 0.4 MG SL SUBL: 0.4000 mg | SUBLINGUAL_TABLET | SUBLINGUAL | 3 refills | Status: DC | PRN

## 2016-11-04 NOTE — Addendum Note (Signed)
Addended by: Stevan Born on: 11/04/2016 09:20 AM   Modules accepted: Orders

## 2016-11-04 NOTE — Progress Notes (Signed)
Cardiology Office Note:    Date:  11/04/2016   ID:  Lisa Maxwell, Lisa Maxwell Aug 07, 1945, MRN [id number]  PCP:  Melony Overly, MD  Cardiologist:  Jenean Lindau, MD   Referring MD: No ref. provider found    ASSESSMENT:    1. Coronary artery disease involving native coronary artery of native heart without angina pectoris   2. Essential hypertension   3. Diabetes mellitus due to underlying condition with complication, without long-term current use of insulin ([place])   4. Mixed dyslipidemia   5. Morbid obesity ([place])    PLAN:    In order of problems listed above:  1. Secondary prevention stressed to the patient. Importance of compliance with diet and medications stressed and she vocalized understanding. She's had blood work done at her primary care office and she has a copy of it and she will bring it to me. We will review this and advise accordingly. 2. Patient's blood pressure stable 3. Diet was discussed with dyslipidemia and diabetes mellitus and obesity and she vocalized understanding. She will be seen in follow-up appointment in 6 months or earlier if she has any concerns.   Medication Adjustments/Labs and Tests Ordered: Current medicines are reviewed at length with the patient today.  Concerns regarding medicines are outlined above.  No orders of the defined types were placed in this encounter.  No orders of the defined types were placed in this encounter.    History of Present Illness:    Lisa Maxwell is a 71 y.o. female who is being seen today for the evaluation of Coronary artery disease. The patient mentions to me that she has transferred her care to our practice here. She has seen in the past at the other practice. She denies any chest pain orthopnea or PND. She is morbidly obese. She has coronary artery disease, essential hypertension, dyslipidemia and diabetes mellitus. She leads a sedentary lifestyle because of orthopedic issues involving her back. At the time of my  evaluation she is alert awake oriented and in no distress. She does have nitroglycerin with her to be used on a when necessary basis.  Past Medical History:  Diagnosis Date  . Cerebrovascular disease   . Diabetes ([place])   . Hypertension   . Osteoarthritis of left knee   . Sleep apnea     Past Surgical History:  Procedure Laterality Date  . total left knee      Current Medications: Current Meds  Medication Sig  . ANORO ELLIPTA 62.5-25 MCG/INH AEPB Inhale 1 puff into the lungs daily.  [name] aspirin 81 MG tablet Take 81 mg by mouth daily.  . carvedilol (COREG) 12.5 MG tablet Take 12.5 mg by mouth 2 (two) times daily with a meal.  . cetirizine (ZYRTEC) 10 MG tablet Take 10 mg by mouth daily.  . cholecalciferol (VITAMIN D) 1000 UNITS tablet Take 1,000 Units by mouth daily.  [name] ELIQUIS 5 MG TABS tablet Take 5 mg by mouth daily.  . fenofibrate 160 MG tablet Take 160 mg by mouth daily.  . ferrous sulfate 325 (65 FE) MG tablet Take 325 mg by mouth daily with breakfast.  . furosemide (LASIX) 40 MG tablet Take 40 mg by mouth.  . gabapentin (NEURONTIN) 300 MG capsule Take 300 mg by mouth daily.  [name] glipiZIDE (GLUCOTROL XL) 2.5 MG 24 hr tablet Take 2.5 mg by mouth daily with breakfast.  . JANUVIA 50 MG tablet Take 50 mg by mouth daily.  [name] latanoprost (XALATAN) 0.005 %  ophthalmic solution Place 1 drop into both eyes at bedtime.  [name] losartan (COZAAR) 50 MG tablet Take 50 mg by mouth daily.  [name] lovastatin (MEVACOR) 20 MG tablet Take 20 mg by mouth at bedtime.  [name] nystatin cream (MYCOSTATIN) Apply 1 application topically 2 (two) times daily as needed for dry skin.  [name] omeprazole (PRILOSEC) 40 MG capsule Take 40 mg by mouth daily.  [name] PROAIR HFA 108 (90 Base) MCG/ACT inhaler Inhale 1 puff into the lungs daily.  [name] spironolactone (ALDACTONE) 25 MG tablet Take 25 mg by mouth daily.     Allergies:   Patient has no known allergies.   Social History   Social History  . Marital status: Married    Spouse  name: N/A  . Number of children: N/A  . Years of education: N/A   Social History Main Topics  . Smoking status: Former Research scientist (life sciences)  . Smokeless tobacco: Never Used  . Alcohol use No  . Drug use: No  . Sexual activity: Not Asked   Other Topics Concern  . None   Social History Narrative  . None     Family History: The patient's family history is not on file.  ROS:   Please see the history of present illness.    All other systems reviewed and are negative.  EKGs/Labs/Other Studies Reviewed:    The following studies were reviewed today: I reviewed previous office records extensively. Patient had questions which were answered to her satisfaction. EKG done today revealed sinus rhythm and nonspecific ST-T changes. Patient also has a right bundle branch block.   Recent Labs: No results found for requested labs within last 8760 hours.  Recent Lipid Panel No results found for: CHOL, TRIG, HDL, CHOLHDL, VLDL, LDLCALC, LDLDIRECT  Physical Exam:    VS:  BP 106/62   Pulse 80   Ht 4\' 10"  (1.473 m)   Wt 194 lb 1.9 oz (88.1 kg)   SpO2 97%   BMI 40.57 kg/m     Wt Readings from Last 3 Encounters:  11/04/16 194 lb 1.9 oz (88.1 kg)  12/04/14 185 lb (83.9 kg)     GEN: Patient is in no acute distress HEENT: Normal NECK: No JVD; No carotid bruits LYMPHATICS: No lymphadenopathy CARDIAC: S1 S2 regular, 2/6 systolic murmur at the apex. RESPIRATORY:  Clear to auscultation without rales, wheezing or rhonchi  ABDOMEN: Soft, non-tender, non-distended MUSCULOSKELETAL:  No edema; No deformity  SKIN: Warm and dry NEUROLOGIC:  Alert and oriented x 3 PSYCHIATRIC:  Normal affect    Signed, Jenean Lindau, MD  11/04/2016 9:12 AM    [facility]

## 2016-11-04 NOTE — Patient Instructions (Addendum)
Medication Instructions:  Your physician has recommended you make the following change in your medication:  START nitroglycerin 0.4 mg tablet sublingual (under your tongue) as needed for chest pain. When having chest pain, stop what you are doing and sit down. Take 1 nitro, wait 5 minutes. Still having chest pain, take 1 nitro, wait 5 minutes. Still having chest pain, take 1 nitro, dial 911. Total of 3 nitro in 15 minutes.    Labwork: None  Testing/Procedures: You had an EKG today.  Follow-Up: Your physician wants you to follow-up in: 6 months. You will receive a reminder letter in the mail two months in advance. If you don't receive a letter, please call our office to schedule the follow-up appointment.   Any Other Special Instructions Will Be Listed Below (If Applicable).     If you need a refill on your cardiac medications before your next appointment, please call your pharmacy.

## 2016-11-13 DIAGNOSIS — D631 Anemia in chronic kidney disease: Secondary | ICD-10-CM | POA: Diagnosis not present

## 2016-11-13 DIAGNOSIS — N390 Urinary tract infection, site not specified: Secondary | ICD-10-CM | POA: Diagnosis not present

## 2016-11-13 DIAGNOSIS — N183 Chronic kidney disease, stage 3 (moderate): Secondary | ICD-10-CM | POA: Diagnosis not present

## 2016-11-13 DIAGNOSIS — N2581 Secondary hyperparathyroidism of renal origin: Secondary | ICD-10-CM | POA: Diagnosis not present

## 2016-11-25 DIAGNOSIS — N2581 Secondary hyperparathyroidism of renal origin: Secondary | ICD-10-CM | POA: Diagnosis not present

## 2016-11-25 DIAGNOSIS — I129 Hypertensive chronic kidney disease with stage 1 through stage 4 chronic kidney disease, or unspecified chronic kidney disease: Secondary | ICD-10-CM | POA: Diagnosis not present

## 2016-11-25 DIAGNOSIS — D631 Anemia in chronic kidney disease: Secondary | ICD-10-CM | POA: Diagnosis not present

## 2016-11-25 DIAGNOSIS — N183 Chronic kidney disease, stage 3 (moderate): Secondary | ICD-10-CM | POA: Diagnosis not present

## 2017-01-08 DIAGNOSIS — G4733 Obstructive sleep apnea (adult) (pediatric): Secondary | ICD-10-CM | POA: Diagnosis not present

## 2017-01-11 ENCOUNTER — Other Ambulatory Visit: Payer: Self-pay

## 2017-01-11 MED ORDER — ELIQUIS 5 MG PO TABS: 5.0000 mg | ORAL_TABLET | Freq: Every day | ORAL | 3 refills | Status: DC

## 2017-01-14 ENCOUNTER — Telehealth: Payer: Self-pay | Admitting: Cardiology

## 2017-01-14 NOTE — Telephone Encounter (Signed)
Please call in Her Eliquis with the correct doasage on it. She taked TWICe daily.. Call to [facility] [place]

## 2017-01-15 ENCOUNTER — Other Ambulatory Visit: Payer: Self-pay

## 2017-01-15 DIAGNOSIS — I2510 Atherosclerotic heart disease of native coronary artery without angina pectoris: Secondary | ICD-10-CM

## 2017-01-15 MED ORDER — ELIQUIS 5 MG PO TABS: 5.0000 mg | ORAL_TABLET | Freq: Two times a day (BID) | ORAL | 3 refills | Status: DC

## 2017-01-15 NOTE — Telephone Encounter (Signed)
[facility]

## 2017-01-26 DIAGNOSIS — Z0000 Encounter for general adult medical examination without abnormal findings: Secondary | ICD-10-CM | POA: Diagnosis not present

## 2017-01-26 DIAGNOSIS — E559 Vitamin D deficiency, unspecified: Secondary | ICD-10-CM | POA: Diagnosis not present

## 2017-01-26 DIAGNOSIS — E1122 Type 2 diabetes mellitus with diabetic chronic kidney disease: Secondary | ICD-10-CM | POA: Diagnosis not present

## 2017-01-26 DIAGNOSIS — Z1389 Encounter for screening for other disorder: Secondary | ICD-10-CM | POA: Diagnosis not present

## 2017-01-26 DIAGNOSIS — E7800 Pure hypercholesterolemia, unspecified: Secondary | ICD-10-CM | POA: Diagnosis not present

## 2017-01-26 DIAGNOSIS — Z1211 Encounter for screening for malignant neoplasm of colon: Secondary | ICD-10-CM | POA: Diagnosis not present

## 2017-01-26 DIAGNOSIS — Z23 Encounter for immunization: Secondary | ICD-10-CM | POA: Diagnosis not present

## 2017-01-26 DIAGNOSIS — I10 Essential (primary) hypertension: Secondary | ICD-10-CM | POA: Diagnosis not present

## 2017-02-01 DIAGNOSIS — I10 Essential (primary) hypertension: Secondary | ICD-10-CM | POA: Diagnosis not present

## 2017-02-01 DIAGNOSIS — K219 Gastro-esophageal reflux disease without esophagitis: Secondary | ICD-10-CM | POA: Diagnosis not present

## 2017-02-01 DIAGNOSIS — N184 Chronic kidney disease, stage 4 (severe): Secondary | ICD-10-CM | POA: Diagnosis not present

## 2017-02-01 DIAGNOSIS — E1365 Other specified diabetes mellitus with hyperglycemia: Secondary | ICD-10-CM | POA: Diagnosis not present

## 2017-02-01 DIAGNOSIS — E1322 Other specified diabetes mellitus with diabetic chronic kidney disease: Secondary | ICD-10-CM | POA: Diagnosis not present

## 2017-02-10 DIAGNOSIS — M85852 Other specified disorders of bone density and structure, left thigh: Secondary | ICD-10-CM | POA: Diagnosis not present

## 2017-02-10 DIAGNOSIS — N959 Unspecified menopausal and perimenopausal disorder: Secondary | ICD-10-CM | POA: Diagnosis not present

## 2017-02-15 DIAGNOSIS — I129 Hypertensive chronic kidney disease with stage 1 through stage 4 chronic kidney disease, or unspecified chronic kidney disease: Secondary | ICD-10-CM | POA: Diagnosis not present

## 2017-02-15 DIAGNOSIS — E1122 Type 2 diabetes mellitus with diabetic chronic kidney disease: Secondary | ICD-10-CM | POA: Diagnosis not present

## 2017-02-15 DIAGNOSIS — N183 Chronic kidney disease, stage 3 (moderate): Secondary | ICD-10-CM | POA: Diagnosis not present

## 2017-02-15 DIAGNOSIS — D692 Other nonthrombocytopenic purpura: Secondary | ICD-10-CM | POA: Diagnosis not present

## 2017-02-16 DIAGNOSIS — Z1389 Encounter for screening for other disorder: Secondary | ICD-10-CM | POA: Diagnosis not present

## 2017-02-16 DIAGNOSIS — Z136 Encounter for screening for cardiovascular disorders: Secondary | ICD-10-CM | POA: Diagnosis not present

## 2017-02-16 DIAGNOSIS — E785 Hyperlipidemia, unspecified: Secondary | ICD-10-CM | POA: Diagnosis not present

## 2017-02-16 DIAGNOSIS — Z0000 Encounter for general adult medical examination without abnormal findings: Secondary | ICD-10-CM | POA: Diagnosis not present

## 2017-02-23 DIAGNOSIS — N183 Chronic kidney disease, stage 3 (moderate): Secondary | ICD-10-CM | POA: Diagnosis not present

## 2017-02-23 DIAGNOSIS — E1122 Type 2 diabetes mellitus with diabetic chronic kidney disease: Secondary | ICD-10-CM | POA: Diagnosis not present

## 2017-03-02 ENCOUNTER — Telehealth: Payer: Self-pay | Admitting: Cardiology

## 2017-03-02 ENCOUNTER — Other Ambulatory Visit: Payer: Self-pay

## 2017-03-02 NOTE — Telephone Encounter (Signed)
Spoke with [place] [facility]; they stated that the prescription is correct and that the patient has refills. Informed the patient that she did not need a refill.

## 2017-03-02 NOTE — Telephone Encounter (Signed)
[facility] [place]

## 2017-03-04 DIAGNOSIS — N183 Chronic kidney disease, stage 3 (moderate): Secondary | ICD-10-CM | POA: Diagnosis not present

## 2017-03-04 DIAGNOSIS — N2581 Secondary hyperparathyroidism of renal origin: Secondary | ICD-10-CM | POA: Diagnosis not present

## 2017-03-04 DIAGNOSIS — D631 Anemia in chronic kidney disease: Secondary | ICD-10-CM | POA: Diagnosis not present

## 2017-03-05 ENCOUNTER — Other Ambulatory Visit: Payer: Self-pay

## 2017-03-05 ENCOUNTER — Telehealth: Payer: Self-pay | Admitting: Cardiology

## 2017-03-05 DIAGNOSIS — I2510 Atherosclerotic heart disease of native coronary artery without angina pectoris: Secondary | ICD-10-CM

## 2017-03-05 MED ORDER — ELIQUIS 5 MG PO TABS: 5.0000 mg | ORAL_TABLET | Freq: Two times a day (BID) | ORAL | 3 refills | Status: DC

## 2017-03-05 NOTE — Telephone Encounter (Signed)
[facility] [place]

## 2017-03-05 NOTE — Telephone Encounter (Signed)
Wants you to call her about her eliquis

## 2017-03-12 DIAGNOSIS — D631 Anemia in chronic kidney disease: Secondary | ICD-10-CM | POA: Diagnosis not present

## 2017-03-12 DIAGNOSIS — I129 Hypertensive chronic kidney disease with stage 1 through stage 4 chronic kidney disease, or unspecified chronic kidney disease: Secondary | ICD-10-CM | POA: Diagnosis not present

## 2017-03-12 DIAGNOSIS — N2581 Secondary hyperparathyroidism of renal origin: Secondary | ICD-10-CM | POA: Diagnosis not present

## 2017-03-12 DIAGNOSIS — N183 Chronic kidney disease, stage 3 (moderate): Secondary | ICD-10-CM | POA: Diagnosis not present

## 2017-03-24 DIAGNOSIS — H40013 Open angle with borderline findings, low risk, bilateral: Secondary | ICD-10-CM | POA: Diagnosis not present

## 2017-04-12 DIAGNOSIS — G4733 Obstructive sleep apnea (adult) (pediatric): Secondary | ICD-10-CM | POA: Diagnosis not present

## 2017-04-28 DIAGNOSIS — E559 Vitamin D deficiency, unspecified: Secondary | ICD-10-CM | POA: Diagnosis not present

## 2017-04-28 DIAGNOSIS — K219 Gastro-esophageal reflux disease without esophagitis: Secondary | ICD-10-CM | POA: Diagnosis not present

## 2017-04-28 DIAGNOSIS — I1310 Hypertensive heart and chronic kidney disease without heart failure, with stage 1 through stage 4 chronic kidney disease, or unspecified chronic kidney disease: Secondary | ICD-10-CM | POA: Diagnosis not present

## 2017-04-28 DIAGNOSIS — J449 Chronic obstructive pulmonary disease, unspecified: Secondary | ICD-10-CM | POA: Diagnosis not present

## 2017-04-28 DIAGNOSIS — E1122 Type 2 diabetes mellitus with diabetic chronic kidney disease: Secondary | ICD-10-CM | POA: Diagnosis not present

## 2017-04-28 DIAGNOSIS — E7800 Pure hypercholesterolemia, unspecified: Secondary | ICD-10-CM | POA: Diagnosis not present

## 2017-04-28 DIAGNOSIS — E039 Hypothyroidism, unspecified: Secondary | ICD-10-CM | POA: Diagnosis not present

## 2017-04-28 DIAGNOSIS — N183 Chronic kidney disease, stage 3 (moderate): Secondary | ICD-10-CM | POA: Diagnosis not present

## 2017-05-04 DIAGNOSIS — I1310 Hypertensive heart and chronic kidney disease without heart failure, with stage 1 through stage 4 chronic kidney disease, or unspecified chronic kidney disease: Secondary | ICD-10-CM | POA: Diagnosis not present

## 2017-05-04 DIAGNOSIS — N183 Chronic kidney disease, stage 3 (moderate): Secondary | ICD-10-CM | POA: Diagnosis not present

## 2017-05-24 ENCOUNTER — Ambulatory Visit: Payer: Medicare Other | Admitting: Cardiology

## 2017-05-24 ENCOUNTER — Encounter: Payer: Self-pay | Admitting: Cardiology

## 2017-05-24 ENCOUNTER — Other Ambulatory Visit: Payer: Self-pay

## 2017-05-24 VITALS — BP 124/80 | HR 85 | Ht <= 58 in | Wt 189.0 lb

## 2017-05-24 DIAGNOSIS — I2510 Atherosclerotic heart disease of native coronary artery without angina pectoris: Secondary | ICD-10-CM | POA: Diagnosis not present

## 2017-05-24 DIAGNOSIS — E088 Diabetes mellitus due to underlying condition with unspecified complications: Secondary | ICD-10-CM

## 2017-05-24 DIAGNOSIS — I10 Essential (primary) hypertension: Secondary | ICD-10-CM

## 2017-05-24 DIAGNOSIS — I480 Paroxysmal atrial fibrillation: Secondary | ICD-10-CM

## 2017-05-24 NOTE — Patient Instructions (Signed)
Medication Instructions:  Your physician recommends that you continue on your current medications as directed. Please refer to the Current Medication list given to you today.  Labwork: None  Testing/Procedures: None  Follow-Up: Your physician recommends that you schedule a follow-up appointment in: 8 months  Any Other Special Instructions Will Be Listed Below (If Applicable).     If you need a refill on your cardiac medications before your next appointment, please call your pharmacy.   [place], RN, BSN

## 2017-05-24 NOTE — Progress Notes (Signed)
Cardiology Office Note:    Date:  05/24/2017   ID:  Lisa Maxwell, Lisa Maxwell Oct 03, 1945, MRN [id number]  PCP:  Melony Overly, MD  Cardiologist:  Jenean Lindau, MD   Referring MD: Melony Overly, MD    ASSESSMENT:    1. Coronary artery disease involving native coronary artery of native heart without angina pectoris   2. Essential hypertension   3. PAF (paroxysmal atrial fibrillation) ([place])   4. Diabetes mellitus due to underlying condition with complication, without long-term current use of insulin ([facility])    PLAN:    In order of problems listed above:  1. Secondary prevention stressed with the patient.  Importance of compliance with diet and medications stressed.  Diet was discussed with dyslipidemia diabetes mellitus and obesity and she vocalized understanding.  Risks of obesity explained she plans to work more aggressively towards losing weight. 2. Her lipids are followed by primary care physician her blood pressure stable.  Exercise protocol was defined again. 3. I discussed with the patient atrial fibrillation, disease process. Management and therapy including rate and rhythm control, anticoagulation benefits and potential risks were discussed extensively with the patient. Patient had multiple questions which were answered to patient's satisfaction. 4. Patient will be seen in follow-up appointment in 6 months or earlier if the patient has any concerns    Medication Adjustments/Labs and Tests Ordered: Current medicines are reviewed at length with the patient today.  Concerns regarding medicines are outlined above.  Orders Placed This Encounter  Procedures  . EKG 12-Lead   No orders of the defined types were placed in this encounter.    Chief Complaint  Patient presents with  . Follow-up  . Coronary Artery Disease     History of Present Illness:    Lisa Maxwell is a 72 y.o. female.  Patient has known coronary artery disease, essential hypertension, dyslipidemia,  diabetes mellitus, approximately fibrillation morbid obesity.  She leads a sedentary lifestyle.  She denies any problems at this time.  No chest pain orthopnea or PND.  At the time of my evaluation, the patient is alert awake oriented and in no distress.  She is here for follow-up.  Past Medical History:  Diagnosis Date  . Cerebrovascular disease   . Diabetes ([place])   . Hypertension   . Osteoarthritis of left knee   . Sleep apnea     Past Surgical History:  Procedure Laterality Date  . total left knee      Current Medications: Current Meds  Medication Sig  . aspirin 81 MG tablet Take 81 mg by mouth daily.  . cetirizine (ZYRTEC) 10 MG tablet Take 10 mg by mouth daily.  . cholecalciferol (VITAMIN D) 1000 UNITS tablet Take 1,000 Units by mouth daily.  [name] ELIQUIS 5 MG TABS tablet Take 1 tablet (5 mg total) by mouth 2 (two) times daily.  . fenofibrate 160 MG tablet Take 160 mg by mouth daily.  [name] gabapentin (NEURONTIN) 300 MG capsule Take 300 mg by mouth daily.  [name] lovastatin (MEVACOR) 20 MG tablet Take 20 mg by mouth at bedtime.  [name] PROAIR HFA 108 (90 Base) MCG/ACT inhaler Inhale 1 puff into the lungs daily.  [name] spironolactone (ALDACTONE) 25 MG tablet Take 25 mg by mouth daily.  . TRULICITY 1.5 AO/1.3YQ SOPN   . vitamin B-12 (CYANOCOBALAMIN) 1000 MCG tablet Take by mouth.  . [DISCONTINUED] ANORO ELLIPTA 62.5-25 MCG/INH AEPB Inhale 1 puff into the lungs daily.     Allergies:  Patient has no known allergies.   Social History   Socioeconomic History  . Marital status: Married    Spouse name: None  . Number of children: None  . Years of education: None  . Highest education level: None  Social Needs  . Financial resource strain: None  . Food insecurity - worry: None  . Food insecurity - inability: None  . Transportation needs - medical: None  . Transportation needs - non-medical: None  Occupational History  . None  Tobacco Use  . Smoking status: Former Research scientist (life sciences)  . Smokeless  tobacco: Never Used  Substance and Sexual Activity  . Alcohol use: No  . Drug use: No  . Sexual activity: None  Other Topics Concern  . None  Social History Narrative  . None     Family History: The patient's family history is not on file.  ROS:   Please see the history of present illness.    All other systems reviewed and are negative.  EKGs/Labs/Other Studies Reviewed:    The following studies were reviewed today: Discussed findings of today's evaluation.  EKG done today reveals sinus rhythm and nonspecific ST-T changes.  She is in sinus rhythm.   Recent Labs: No results found for requested labs within last 8760 hours.  Recent Lipid Panel No results found for: CHOL, TRIG, HDL, CHOLHDL, VLDL, LDLCALC, LDLDIRECT  Physical Exam:    VS:  BP 124/80 (BP Location: Right Arm, Patient Position: Sitting, Cuff Size: Normal)   Pulse 85   Ht 4\' 10"  (1.473 m)   Wt 189 lb (85.7 kg)   SpO2 98%   BMI 39.50 kg/m     Wt Readings from Last 3 Encounters:  05/24/17 189 lb (85.7 kg)  11/04/16 194 lb 1.9 oz (88.1 kg)  12/04/14 185 lb (83.9 kg)     GEN: Patient is in no acute distress HEENT: Normal NECK: No JVD; No carotid bruits LYMPHATICS: No lymphadenopathy CARDIAC: Hear sounds regular, 2/6 systolic murmur at the apex. RESPIRATORY:  Clear to auscultation without rales, wheezing or rhonchi  ABDOMEN: Soft, non-tender, non-distended MUSCULOSKELETAL:  No edema; No deformity  SKIN: Warm and dry NEUROLOGIC:  Alert and oriented x 3 PSYCHIATRIC:  Normal affect   Signed, Jenean Lindau, MD  05/24/2017 3:04 PM    [facility]

## 2017-06-25 DIAGNOSIS — R300 Dysuria: Secondary | ICD-10-CM | POA: Diagnosis not present

## 2017-06-25 DIAGNOSIS — R350 Frequency of micturition: Secondary | ICD-10-CM | POA: Diagnosis not present

## 2017-06-25 DIAGNOSIS — B372 Candidiasis of skin and nail: Secondary | ICD-10-CM | POA: Diagnosis not present

## 2017-06-28 DIAGNOSIS — N390 Urinary tract infection, site not specified: Secondary | ICD-10-CM | POA: Diagnosis not present

## 2017-06-28 DIAGNOSIS — Z79899 Other long term (current) drug therapy: Secondary | ICD-10-CM | POA: Diagnosis not present

## 2017-06-28 DIAGNOSIS — R3915 Urgency of urination: Secondary | ICD-10-CM | POA: Diagnosis not present

## 2017-06-28 DIAGNOSIS — R339 Retention of urine, unspecified: Secondary | ICD-10-CM | POA: Diagnosis not present

## 2017-08-02 DIAGNOSIS — R350 Frequency of micturition: Secondary | ICD-10-CM | POA: Diagnosis not present

## 2017-08-02 DIAGNOSIS — N390 Urinary tract infection, site not specified: Secondary | ICD-10-CM | POA: Diagnosis not present

## 2017-08-02 DIAGNOSIS — R339 Retention of urine, unspecified: Secondary | ICD-10-CM | POA: Diagnosis not present

## 2017-08-06 DIAGNOSIS — J449 Chronic obstructive pulmonary disease, unspecified: Secondary | ICD-10-CM | POA: Diagnosis not present

## 2017-08-06 DIAGNOSIS — I1310 Hypertensive heart and chronic kidney disease without heart failure, with stage 1 through stage 4 chronic kidney disease, or unspecified chronic kidney disease: Secondary | ICD-10-CM | POA: Diagnosis not present

## 2017-08-06 DIAGNOSIS — E1122 Type 2 diabetes mellitus with diabetic chronic kidney disease: Secondary | ICD-10-CM | POA: Diagnosis not present

## 2017-08-06 DIAGNOSIS — E7800 Pure hypercholesterolemia, unspecified: Secondary | ICD-10-CM | POA: Diagnosis not present

## 2017-08-06 DIAGNOSIS — E559 Vitamin D deficiency, unspecified: Secondary | ICD-10-CM | POA: Diagnosis not present

## 2017-08-06 DIAGNOSIS — K219 Gastro-esophageal reflux disease without esophagitis: Secondary | ICD-10-CM | POA: Diagnosis not present

## 2017-08-31 DIAGNOSIS — N390 Urinary tract infection, site not specified: Secondary | ICD-10-CM | POA: Diagnosis not present

## 2017-08-31 DIAGNOSIS — R339 Retention of urine, unspecified: Secondary | ICD-10-CM | POA: Diagnosis not present

## 2017-09-22 DIAGNOSIS — H1851 Endothelial corneal dystrophy: Secondary | ICD-10-CM | POA: Diagnosis not present

## 2017-09-22 DIAGNOSIS — E119 Type 2 diabetes mellitus without complications: Secondary | ICD-10-CM | POA: Diagnosis not present

## 2017-09-22 DIAGNOSIS — Z961 Presence of intraocular lens: Secondary | ICD-10-CM | POA: Diagnosis not present

## 2017-09-22 DIAGNOSIS — H04123 Dry eye syndrome of bilateral lacrimal glands: Secondary | ICD-10-CM | POA: Diagnosis not present

## 2017-09-22 DIAGNOSIS — H40013 Open angle with borderline findings, low risk, bilateral: Secondary | ICD-10-CM | POA: Diagnosis not present

## 2017-10-26 DIAGNOSIS — Z1231 Encounter for screening mammogram for malignant neoplasm of breast: Secondary | ICD-10-CM | POA: Diagnosis not present

## 2017-11-26 DIAGNOSIS — E1122 Type 2 diabetes mellitus with diabetic chronic kidney disease: Secondary | ICD-10-CM | POA: Diagnosis not present

## 2017-11-26 DIAGNOSIS — K219 Gastro-esophageal reflux disease without esophagitis: Secondary | ICD-10-CM | POA: Diagnosis not present

## 2017-11-26 DIAGNOSIS — E7800 Pure hypercholesterolemia, unspecified: Secondary | ICD-10-CM | POA: Diagnosis not present

## 2017-11-26 DIAGNOSIS — I1310 Hypertensive heart and chronic kidney disease without heart failure, with stage 1 through stage 4 chronic kidney disease, or unspecified chronic kidney disease: Secondary | ICD-10-CM | POA: Diagnosis not present

## 2017-11-26 DIAGNOSIS — J449 Chronic obstructive pulmonary disease, unspecified: Secondary | ICD-10-CM | POA: Diagnosis not present

## 2017-12-03 DIAGNOSIS — M4316 Spondylolisthesis, lumbar region: Secondary | ICD-10-CM | POA: Diagnosis not present

## 2017-12-09 DIAGNOSIS — M4316 Spondylolisthesis, lumbar region: Secondary | ICD-10-CM | POA: Diagnosis not present

## 2017-12-09 DIAGNOSIS — M545 Low back pain: Secondary | ICD-10-CM | POA: Diagnosis not present

## 2017-12-10 DIAGNOSIS — M4316 Spondylolisthesis, lumbar region: Secondary | ICD-10-CM | POA: Diagnosis not present

## 2017-12-16 DIAGNOSIS — N183 Chronic kidney disease, stage 3 (moderate): Secondary | ICD-10-CM | POA: Diagnosis not present

## 2017-12-20 DIAGNOSIS — D631 Anemia in chronic kidney disease: Secondary | ICD-10-CM | POA: Diagnosis not present

## 2017-12-20 DIAGNOSIS — I129 Hypertensive chronic kidney disease with stage 1 through stage 4 chronic kidney disease, or unspecified chronic kidney disease: Secondary | ICD-10-CM | POA: Diagnosis not present

## 2017-12-20 DIAGNOSIS — N2581 Secondary hyperparathyroidism of renal origin: Secondary | ICD-10-CM | POA: Diagnosis not present

## 2017-12-20 DIAGNOSIS — N183 Chronic kidney disease, stage 3 (moderate): Secondary | ICD-10-CM | POA: Diagnosis not present

## 2017-12-20 DIAGNOSIS — Z23 Encounter for immunization: Secondary | ICD-10-CM | POA: Diagnosis not present

## 2017-12-23 DIAGNOSIS — M4316 Spondylolisthesis, lumbar region: Secondary | ICD-10-CM | POA: Diagnosis not present

## 2017-12-23 DIAGNOSIS — M47816 Spondylosis without myelopathy or radiculopathy, lumbar region: Secondary | ICD-10-CM | POA: Diagnosis not present

## 2017-12-23 DIAGNOSIS — M48061 Spinal stenosis, lumbar region without neurogenic claudication: Secondary | ICD-10-CM | POA: Diagnosis not present

## 2018-01-03 DIAGNOSIS — N390 Urinary tract infection, site not specified: Secondary | ICD-10-CM | POA: Diagnosis not present

## 2018-01-03 DIAGNOSIS — N189 Chronic kidney disease, unspecified: Secondary | ICD-10-CM | POA: Diagnosis not present

## 2018-01-03 DIAGNOSIS — I129 Hypertensive chronic kidney disease with stage 1 through stage 4 chronic kidney disease, or unspecified chronic kidney disease: Secondary | ICD-10-CM | POA: Diagnosis not present

## 2018-01-03 DIAGNOSIS — N2581 Secondary hyperparathyroidism of renal origin: Secondary | ICD-10-CM | POA: Diagnosis not present

## 2018-01-03 DIAGNOSIS — N183 Chronic kidney disease, stage 3 (moderate): Secondary | ICD-10-CM | POA: Diagnosis not present

## 2018-01-14 DIAGNOSIS — M47817 Spondylosis without myelopathy or radiculopathy, lumbosacral region: Secondary | ICD-10-CM | POA: Diagnosis not present

## 2018-01-14 DIAGNOSIS — M4316 Spondylolisthesis, lumbar region: Secondary | ICD-10-CM | POA: Diagnosis not present

## 2018-01-25 DIAGNOSIS — M4316 Spondylolisthesis, lumbar region: Secondary | ICD-10-CM | POA: Diagnosis not present

## 2018-01-26 DIAGNOSIS — R0602 Shortness of breath: Secondary | ICD-10-CM | POA: Diagnosis not present

## 2018-01-26 DIAGNOSIS — Z79899 Other long term (current) drug therapy: Secondary | ICD-10-CM | POA: Diagnosis not present

## 2018-01-26 DIAGNOSIS — E559 Vitamin D deficiency, unspecified: Secondary | ICD-10-CM | POA: Diagnosis not present

## 2018-01-26 DIAGNOSIS — R52 Pain, unspecified: Secondary | ICD-10-CM | POA: Diagnosis not present

## 2018-01-26 DIAGNOSIS — M79609 Pain in unspecified limb: Secondary | ICD-10-CM | POA: Diagnosis not present

## 2018-01-26 DIAGNOSIS — Z01818 Encounter for other preprocedural examination: Secondary | ICD-10-CM | POA: Diagnosis not present

## 2018-01-26 LAB — PROTIME-INR: INR: 1.1 (ref 0.9–1.1)

## 2018-02-08 DIAGNOSIS — M4316 Spondylolisthesis, lumbar region: Secondary | ICD-10-CM | POA: Diagnosis not present

## 2018-02-11 ENCOUNTER — Ambulatory Visit: Payer: Medicare Other | Admitting: Cardiology

## 2018-02-28 ENCOUNTER — Ambulatory Visit: Payer: Medicare Other | Admitting: Cardiology

## 2018-02-28 ENCOUNTER — Encounter: Payer: Self-pay | Admitting: Cardiology

## 2018-02-28 VITALS — BP 122/70 | HR 64 | Ht <= 58 in | Wt 187.0 lb

## 2018-02-28 DIAGNOSIS — N183 Chronic kidney disease, stage 3 unspecified: Secondary | ICD-10-CM

## 2018-02-28 DIAGNOSIS — I2510 Atherosclerotic heart disease of native coronary artery without angina pectoris: Secondary | ICD-10-CM | POA: Diagnosis not present

## 2018-02-28 DIAGNOSIS — I480 Paroxysmal atrial fibrillation: Secondary | ICD-10-CM

## 2018-02-28 DIAGNOSIS — I10 Essential (primary) hypertension: Secondary | ICD-10-CM | POA: Diagnosis not present

## 2018-02-28 DIAGNOSIS — E782 Mixed hyperlipidemia: Secondary | ICD-10-CM

## 2018-02-28 DIAGNOSIS — N289 Disorder of kidney and ureter, unspecified: Secondary | ICD-10-CM

## 2018-02-28 DIAGNOSIS — E088 Diabetes mellitus due to underlying condition with unspecified complications: Secondary | ICD-10-CM

## 2018-02-28 MED ORDER — ELIQUIS 5 MG PO TABS
5.0000 mg | ORAL_TABLET | Freq: Two times a day (BID) | ORAL | 2 refills | Status: DC
Start: 2018-02-28 — End: 2018-11-16

## 2018-02-28 NOTE — Patient Instructions (Signed)
Medication Instructions:  Your physician recommends that you continue on your current medications as directed. Please refer to the Current Medication list given to you today.  Samples of eliquis given  If you need a refill on your cardiac medications before your next appointment, please call your pharmacy.   Lab work: None  If you have labs (blood work) drawn today and your tests are completely normal, you will receive your results only by: [name] MyChart Message (if you have MyChart) OR . A paper copy in the mail If you have any lab test that is abnormal or we need to change your treatment, we will call you to review the results.  Testing/Procedures: None  Follow-Up: At [facility], you and your health needs are our priority.  As part of our continuing mission to provide you with exceptional heart care, we have created designated Provider Care Teams.  These Care Teams include your primary Cardiologist (physician) and Advanced Practice Providers (APPs -  Physician Assistants and Nurse Practitioners) who all work together to provide you with the care you need, when you need it.  You will need a follow up appointment in 6 months.  Please call our office 2 months in advance to schedule this appointment.  You may see another member of our [employer] Provider Team in [place]: Jenne Campus, MD . Shirlee More, MD  Any Other Special Instructions Will Be Listed Below (If Applicable).

## 2018-02-28 NOTE — Progress Notes (Signed)
Cardiology Office Note:    Date:  02/28/2018   ID:  Lisa, Maxwell [birth year], MRN [id number]  PCP:  Melony Overly, MD  Cardiologist:  Jenean Lindau, MD   Referring MD: Melony Overly, MD    ASSESSMENT:    1. PAF (paroxysmal atrial fibrillation) ([place])   2. Coronary artery disease involving native coronary artery of native heart without angina pectoris   3. Essential hypertension   4. Renal insufficiency   5. Morbid obesity ([place])   6. Mixed dyslipidemia   7. Diabetes mellitus due to underlying condition with unspecified complications ([place])   8. CKD (chronic kidney disease) stage 3, GFR 30-59 ml/min ([facility])    PLAN:    In order of problems listed above:  1. Secondary prevention stressed with the patient.  Importance of compliance with diet and medication stressed and she vocalized understanding.  Her blood pressure is stable.  Diet was discussed for dyslipidemia and diabetes mellitus.  Risks of obesity explained and she vocalized understanding. 2. I discussed with the patient atrial fibrillation, disease process. Management and therapy including rate and rhythm control, anticoagulation benefits and potential risks were discussed extensively with the patient. Patient had multiple questions which were answered to patient's satisfaction. 3. I reviewed blood work done at [facility]. 4. Patient will be seen in follow-up appointment in 6 months or earlier if the patient has any concerns.   Medication Adjustments/Labs and Tests Ordered: Current medicines are reviewed at length with the patient today.  Concerns regarding medicines are outlined above.  No orders of the defined types were placed in this encounter.  Meds ordered this encounter  Medications  . ELIQUIS 5 MG TABS tablet    Sig: Take 1 tablet (5 mg total) by mouth 2 (two) times daily.    Dispense:  180 tablet    Refill:  2     No chief complaint on file.    History of Present Illness:    Lisa Maxwell is a 72 y.o. female.  The patient has essential hypertension, diabetes mellitus, coronary artery disease, paroxysmal atrial fibrillation, dyslipidemia and morbid obesity.  She has significant issues with back pain and is seeing orthopedic doctor and a pain medicine doctor for the same.  She denies any chest pain orthopnea or PND.  No symptoms from a cardiovascular standpoint.  Her issues are complicated by the fact that she has significant renal insufficiency.  She is here for follow-up appointment.  At the time of my evaluation, the patient is alert awake oriented and in no distress.  Past Medical History:  Diagnosis Date  . Cerebrovascular disease   . Diabetes ([place])   . Hypertension   . Osteoarthritis of left knee   . Sleep apnea     Past Surgical History:  Procedure Laterality Date  . total left knee      Current Medications: Current Meds  Medication Sig  . aspirin 81 MG tablet Take 81 mg by mouth daily.  . cetirizine (ZYRTEC) 10 MG tablet Take 10 mg by mouth daily.  . cholecalciferol (VITAMIN D) 1000 UNITS tablet Take 1,000 Units by mouth daily.  [name] ELIQUIS 5 MG TABS tablet Take 1 tablet (5 mg total) by mouth 2 (two) times daily.  . fenofibrate 160 MG tablet Take 160 mg by mouth daily.  . furosemide (LASIX) 40 MG tablet Take 1 tablet by mouth daily.  [name] gabapentin (NEURONTIN) 300 MG capsule Take 300 mg by mouth daily.  [name]  lovastatin (MEVACOR) 20 MG tablet Take 20 mg by mouth at bedtime.  [name] PROAIR HFA 108 (90 Base) MCG/ACT inhaler Inhale 1 puff into the lungs every 4 (four) hours as needed.   [name] spironolactone (ALDACTONE) 25 MG tablet Take 25 mg by mouth daily.  . TRADJENTA 5 MG TABS tablet Take 5 mg by mouth daily.  . traMADol (ULTRAM) 50 MG tablet Take 50 mg by mouth daily.  . TRULICITY 1.5 MG/5.0IB SOPN   . vitamin B-12 (CYANOCOBALAMIN) 1000 MCG tablet Take 1,000 mcg by mouth daily.   . [DISCONTINUED] ELIQUIS 5 MG TABS tablet Take 1 tablet (5 mg total) by mouth 2 (two)  times daily.     Allergies:   Patient has no known allergies.   Social History   Socioeconomic History  . Marital status: Married    Spouse name: Not on file  . Number of children: Not on file  . Years of education: Not on file  . Highest education level: Not on file  Occupational History  . Not on file  Social Needs  . Financial resource strain: Not on file  . Food insecurity:    Worry: Not on file    Inability: Not on file  . Transportation needs:    Medical: Not on file    Non-medical: Not on file  Tobacco Use  . Smoking status: Former Research scientist (life sciences)  . Smokeless tobacco: Never Used  Substance and Sexual Activity  . Alcohol use: No  . Drug use: No  . Sexual activity: Not on file  Lifestyle  . Physical activity:    Days per week: Not on file    Minutes per session: Not on file  . Stress: Not on file  Relationships  . Social connections:    Talks on phone: Not on file    Gets together: Not on file    Attends religious service: Not on file    Active member of club or organization: Not on file    Attends meetings of clubs or organizations: Not on file    Relationship status: Not on file  Other Topics Concern  . Not on file  Social History Narrative  . Not on file     Family History: The patient's family history is not on file.  ROS:   Please see the history of present illness.    All other systems reviewed and are negative.  EKGs/Labs/Other Studies Reviewed:    The following studies were reviewed today: I discussed my findings with the patient at extensive length.  Mixed   Recent Labs: No results found for requested labs within last 8760 hours.  Recent Lipid Panel No results found for: CHOL, TRIG, HDL, CHOLHDL, VLDL, LDLCALC, LDLDIRECT  Physical Exam:    VS:  BP 122/70 (BP Location: Right Arm, Patient Position: Sitting, Cuff Size: Normal)   Pulse 64   Ht 4\' 10"  (1.473 m)   Wt 187 lb (84.8 kg)   SpO2 98%   BMI 39.08 kg/m     Wt Readings from Last 3  Encounters:  02/28/18 187 lb (84.8 kg)  05/24/17 189 lb (85.7 kg)  11/04/16 194 lb 1.9 oz (88.1 kg)     GEN: Patient is in no acute distress HEENT: Normal NECK: No JVD; No carotid bruits LYMPHATICS: No lymphadenopathy CARDIAC: Hear sounds regular, 2/6 systolic murmur at the apex. RESPIRATORY:  Clear to auscultation without rales, wheezing or rhonchi  ABDOMEN: Soft, non-tender, non-distended MUSCULOSKELETAL:  No edema; No deformity  SKIN: Warm  and dry NEUROLOGIC:  Alert and oriented x 3 PSYCHIATRIC:  Normal affect   Signed, Jenean Lindau, MD  02/28/2018 10:30 AM    [place]

## 2018-03-08 ENCOUNTER — Telehealth: Payer: Self-pay | Admitting: Cardiology

## 2018-03-08 DIAGNOSIS — M48062 Spinal stenosis, lumbar region with neurogenic claudication: Secondary | ICD-10-CM

## 2018-03-08 DIAGNOSIS — M47816 Spondylosis without myelopathy or radiculopathy, lumbar region: Secondary | ICD-10-CM | POA: Insufficient documentation

## 2018-03-08 DIAGNOSIS — M48061 Spinal stenosis, lumbar region without neurogenic claudication: Secondary | ICD-10-CM | POA: Insufficient documentation

## 2018-03-08 DIAGNOSIS — G8929 Other chronic pain: Secondary | ICD-10-CM

## 2018-03-08 DIAGNOSIS — M545 Low back pain, unspecified: Secondary | ICD-10-CM | POA: Insufficient documentation

## 2018-03-08 DIAGNOSIS — M4686 Other specified inflammatory spondylopathies, lumbar region: Secondary | ICD-10-CM | POA: Diagnosis not present

## 2018-03-08 DIAGNOSIS — G894 Chronic pain syndrome: Secondary | ICD-10-CM | POA: Insufficient documentation

## 2018-03-08 DIAGNOSIS — M5416 Radiculopathy, lumbar region: Secondary | ICD-10-CM | POA: Insufficient documentation

## 2018-03-08 HISTORY — DX: Spondylosis without myelopathy or radiculopathy, lumbar region: M47.816

## 2018-03-08 HISTORY — DX: Radiculopathy, lumbar region: M54.16

## 2018-03-08 HISTORY — DX: Chronic pain syndrome: G89.4

## 2018-03-08 HISTORY — DX: Other chronic pain: G89.29

## 2018-03-08 HISTORY — DX: Spinal stenosis, lumbar region without neurogenic claudication: M48.061

## 2018-03-08 HISTORY — DX: Spinal stenosis, lumbar region with neurogenic claudication: M48.062

## 2018-03-08 NOTE — Telephone Encounter (Signed)
Asking if patient can hold Eloquis for three days prior to procedure.

## 2018-03-08 NOTE — Telephone Encounter (Signed)
Having a medial branch block facet joint injection to L3, L4, L5, and S1. Patient will need to hold eliquis for 3 days prior to. Please advise?        Contact person at [facility]- Joe

## 2018-03-09 NOTE — Telephone Encounter (Signed)
Left voicemail for Lisa Maxwell to call the office regarding clearance.

## 2018-03-09 NOTE — Telephone Encounter (Signed)
Patient was informed of the risk of holding the medication. Will await for Dr. Julien Nordmann recommendation.

## 2018-03-09 NOTE — Telephone Encounter (Signed)
Left voicemail for the patient to call the office to discuss risks.

## 2018-03-09 NOTE — Telephone Encounter (Signed)
These  are a series of 4 injections spaced out for the13th, 20th, and the 27th. Last injection has not been scheduled yet. Joe would like to know if this would be to high risk to hold the medication this much in 1 month, would we prefer to push the injections further apart?

## 2018-03-09 NOTE — Telephone Encounter (Signed)
That showed be fine.  She is at low risk of stroke when this is held for a short period of time but is explained this  to her.

## 2018-03-11 NOTE — Telephone Encounter (Signed)
Patient has called back agin and wants someone to call her.[name]

## 2018-03-14 ENCOUNTER — Telehealth: Payer: Self-pay | Admitting: Cardiology

## 2018-03-14 NOTE — Telephone Encounter (Signed)
Disregard previous phone note as patient was spoken to by Dr. Geraldo Pitter.

## 2018-03-14 NOTE — Telephone Encounter (Signed)
Left voicemail for Joe to call the office.

## 2018-03-14 NOTE — Telephone Encounter (Signed)
I had a lengthy call with her explaining benefits and risks and explaining options of bridging. She opted to go off without cover and get back as advised by her procedural doctor.

## 2018-03-14 NOTE — Telephone Encounter (Signed)
Please call patient regarding her Eloquis. She is supposed to stop it tomorrow and wants to know if this is ok.

## 2018-03-18 DIAGNOSIS — J302 Other seasonal allergic rhinitis: Secondary | ICD-10-CM | POA: Diagnosis not present

## 2018-03-18 DIAGNOSIS — M545 Low back pain: Secondary | ICD-10-CM | POA: Diagnosis not present

## 2018-03-18 DIAGNOSIS — Z7984 Long term (current) use of oral hypoglycemic drugs: Secondary | ICD-10-CM | POA: Diagnosis not present

## 2018-03-18 DIAGNOSIS — E7800 Pure hypercholesterolemia, unspecified: Secondary | ICD-10-CM | POA: Diagnosis not present

## 2018-03-18 DIAGNOSIS — Z79899 Other long term (current) drug therapy: Secondary | ICD-10-CM | POA: Diagnosis not present

## 2018-03-18 DIAGNOSIS — M47816 Spondylosis without myelopathy or radiculopathy, lumbar region: Secondary | ICD-10-CM | POA: Diagnosis not present

## 2018-03-18 DIAGNOSIS — Z7901 Long term (current) use of anticoagulants: Secondary | ICD-10-CM | POA: Diagnosis not present

## 2018-03-18 DIAGNOSIS — G894 Chronic pain syndrome: Secondary | ICD-10-CM | POA: Diagnosis not present

## 2018-03-18 DIAGNOSIS — E119 Type 2 diabetes mellitus without complications: Secondary | ICD-10-CM | POA: Diagnosis not present

## 2018-03-18 DIAGNOSIS — Z9660 Presence of unspecified orthopedic joint implant: Secondary | ICD-10-CM | POA: Diagnosis not present

## 2018-03-18 DIAGNOSIS — M48062 Spinal stenosis, lumbar region with neurogenic claudication: Secondary | ICD-10-CM | POA: Diagnosis not present

## 2018-03-18 DIAGNOSIS — I4891 Unspecified atrial fibrillation: Secondary | ICD-10-CM | POA: Diagnosis not present

## 2018-03-18 DIAGNOSIS — Z7982 Long term (current) use of aspirin: Secondary | ICD-10-CM | POA: Diagnosis not present

## 2018-03-22 DIAGNOSIS — M545 Low back pain: Secondary | ICD-10-CM | POA: Diagnosis not present

## 2018-03-22 DIAGNOSIS — M48062 Spinal stenosis, lumbar region with neurogenic claudication: Secondary | ICD-10-CM | POA: Diagnosis not present

## 2018-03-22 DIAGNOSIS — M48061 Spinal stenosis, lumbar region without neurogenic claudication: Secondary | ICD-10-CM | POA: Diagnosis not present

## 2018-03-22 DIAGNOSIS — G894 Chronic pain syndrome: Secondary | ICD-10-CM | POA: Diagnosis not present

## 2018-03-22 DIAGNOSIS — M47816 Spondylosis without myelopathy or radiculopathy, lumbar region: Secondary | ICD-10-CM | POA: Diagnosis not present

## 2018-03-25 DIAGNOSIS — M48062 Spinal stenosis, lumbar region with neurogenic claudication: Secondary | ICD-10-CM | POA: Diagnosis not present

## 2018-03-25 DIAGNOSIS — E7800 Pure hypercholesterolemia, unspecified: Secondary | ICD-10-CM | POA: Diagnosis not present

## 2018-03-25 DIAGNOSIS — M545 Low back pain: Secondary | ICD-10-CM | POA: Diagnosis not present

## 2018-03-25 DIAGNOSIS — Z7901 Long term (current) use of anticoagulants: Secondary | ICD-10-CM | POA: Diagnosis not present

## 2018-03-25 DIAGNOSIS — Z7982 Long term (current) use of aspirin: Secondary | ICD-10-CM | POA: Diagnosis not present

## 2018-03-25 DIAGNOSIS — I10 Essential (primary) hypertension: Secondary | ICD-10-CM | POA: Diagnosis not present

## 2018-03-25 DIAGNOSIS — G894 Chronic pain syndrome: Secondary | ICD-10-CM | POA: Diagnosis not present

## 2018-03-25 DIAGNOSIS — E119 Type 2 diabetes mellitus without complications: Secondary | ICD-10-CM | POA: Diagnosis not present

## 2018-03-25 DIAGNOSIS — Z79899 Other long term (current) drug therapy: Secondary | ICD-10-CM | POA: Diagnosis not present

## 2018-03-25 DIAGNOSIS — I4891 Unspecified atrial fibrillation: Secondary | ICD-10-CM | POA: Diagnosis not present

## 2018-03-25 DIAGNOSIS — M47816 Spondylosis without myelopathy or radiculopathy, lumbar region: Secondary | ICD-10-CM | POA: Diagnosis not present

## 2018-03-28 DIAGNOSIS — G894 Chronic pain syndrome: Secondary | ICD-10-CM | POA: Diagnosis not present

## 2018-03-28 DIAGNOSIS — M545 Low back pain: Secondary | ICD-10-CM | POA: Diagnosis not present

## 2018-03-28 DIAGNOSIS — M47816 Spondylosis without myelopathy or radiculopathy, lumbar region: Secondary | ICD-10-CM | POA: Diagnosis not present

## 2018-03-28 DIAGNOSIS — M1388 Other specified arthritis, other site: Secondary | ICD-10-CM | POA: Diagnosis not present

## 2018-03-28 DIAGNOSIS — G8929 Other chronic pain: Secondary | ICD-10-CM | POA: Diagnosis not present

## 2018-03-28 DIAGNOSIS — M48061 Spinal stenosis, lumbar region without neurogenic claudication: Secondary | ICD-10-CM | POA: Diagnosis not present

## 2018-04-01 DIAGNOSIS — M48061 Spinal stenosis, lumbar region without neurogenic claudication: Secondary | ICD-10-CM | POA: Diagnosis not present

## 2018-04-01 DIAGNOSIS — Z79899 Other long term (current) drug therapy: Secondary | ICD-10-CM | POA: Diagnosis not present

## 2018-04-01 DIAGNOSIS — M48062 Spinal stenosis, lumbar region with neurogenic claudication: Secondary | ICD-10-CM | POA: Diagnosis not present

## 2018-04-01 DIAGNOSIS — G4733 Obstructive sleep apnea (adult) (pediatric): Secondary | ICD-10-CM | POA: Diagnosis not present

## 2018-04-01 DIAGNOSIS — I4891 Unspecified atrial fibrillation: Secondary | ICD-10-CM | POA: Diagnosis not present

## 2018-04-01 DIAGNOSIS — M545 Low back pain: Secondary | ICD-10-CM | POA: Diagnosis not present

## 2018-04-01 DIAGNOSIS — Z7901 Long term (current) use of anticoagulants: Secondary | ICD-10-CM | POA: Diagnosis not present

## 2018-04-01 DIAGNOSIS — M47816 Spondylosis without myelopathy or radiculopathy, lumbar region: Secondary | ICD-10-CM | POA: Diagnosis not present

## 2018-04-01 DIAGNOSIS — E119 Type 2 diabetes mellitus without complications: Secondary | ICD-10-CM | POA: Diagnosis not present

## 2018-04-01 DIAGNOSIS — E7800 Pure hypercholesterolemia, unspecified: Secondary | ICD-10-CM | POA: Diagnosis not present

## 2018-04-01 DIAGNOSIS — G894 Chronic pain syndrome: Secondary | ICD-10-CM | POA: Diagnosis not present

## 2018-04-01 DIAGNOSIS — I10 Essential (primary) hypertension: Secondary | ICD-10-CM | POA: Diagnosis not present

## 2018-04-01 DIAGNOSIS — Z7982 Long term (current) use of aspirin: Secondary | ICD-10-CM | POA: Diagnosis not present

## 2018-04-12 DIAGNOSIS — E669 Obesity, unspecified: Secondary | ICD-10-CM | POA: Diagnosis not present

## 2018-04-12 DIAGNOSIS — E7800 Pure hypercholesterolemia, unspecified: Secondary | ICD-10-CM | POA: Diagnosis not present

## 2018-04-12 DIAGNOSIS — Z139 Encounter for screening, unspecified: Secondary | ICD-10-CM | POA: Diagnosis not present

## 2018-04-12 DIAGNOSIS — I4891 Unspecified atrial fibrillation: Secondary | ICD-10-CM | POA: Diagnosis not present

## 2018-04-12 DIAGNOSIS — Z9181 History of falling: Secondary | ICD-10-CM | POA: Diagnosis not present

## 2018-04-12 DIAGNOSIS — J449 Chronic obstructive pulmonary disease, unspecified: Secondary | ICD-10-CM | POA: Diagnosis not present

## 2018-04-12 DIAGNOSIS — Z1331 Encounter for screening for depression: Secondary | ICD-10-CM | POA: Diagnosis not present

## 2018-04-12 DIAGNOSIS — E1122 Type 2 diabetes mellitus with diabetic chronic kidney disease: Secondary | ICD-10-CM | POA: Diagnosis not present

## 2018-04-12 DIAGNOSIS — I1310 Hypertensive heart and chronic kidney disease without heart failure, with stage 1 through stage 4 chronic kidney disease, or unspecified chronic kidney disease: Secondary | ICD-10-CM | POA: Diagnosis not present

## 2018-04-29 DIAGNOSIS — I4891 Unspecified atrial fibrillation: Secondary | ICD-10-CM | POA: Diagnosis not present

## 2018-04-29 DIAGNOSIS — E119 Type 2 diabetes mellitus without complications: Secondary | ICD-10-CM | POA: Diagnosis not present

## 2018-04-29 DIAGNOSIS — F17200 Nicotine dependence, unspecified, uncomplicated: Secondary | ICD-10-CM | POA: Diagnosis not present

## 2018-04-29 DIAGNOSIS — M545 Low back pain: Secondary | ICD-10-CM | POA: Diagnosis not present

## 2018-04-29 DIAGNOSIS — G894 Chronic pain syndrome: Secondary | ICD-10-CM | POA: Diagnosis not present

## 2018-04-29 DIAGNOSIS — Z7901 Long term (current) use of anticoagulants: Secondary | ICD-10-CM | POA: Diagnosis not present

## 2018-04-29 DIAGNOSIS — I10 Essential (primary) hypertension: Secondary | ICD-10-CM | POA: Diagnosis not present

## 2018-04-29 DIAGNOSIS — M47816 Spondylosis without myelopathy or radiculopathy, lumbar region: Secondary | ICD-10-CM | POA: Diagnosis not present

## 2018-04-29 DIAGNOSIS — M48062 Spinal stenosis, lumbar region with neurogenic claudication: Secondary | ICD-10-CM | POA: Diagnosis not present

## 2018-04-29 DIAGNOSIS — G4733 Obstructive sleep apnea (adult) (pediatric): Secondary | ICD-10-CM | POA: Diagnosis not present

## 2018-05-04 DIAGNOSIS — M48062 Spinal stenosis, lumbar region with neurogenic claudication: Secondary | ICD-10-CM | POA: Diagnosis not present

## 2018-05-04 DIAGNOSIS — M47816 Spondylosis without myelopathy or radiculopathy, lumbar region: Secondary | ICD-10-CM | POA: Diagnosis not present

## 2018-05-04 DIAGNOSIS — G894 Chronic pain syndrome: Secondary | ICD-10-CM | POA: Diagnosis not present

## 2018-05-04 DIAGNOSIS — M545 Low back pain: Secondary | ICD-10-CM | POA: Diagnosis not present

## 2018-05-04 DIAGNOSIS — M1288 Other specific arthropathies, not elsewhere classified, other specified site: Secondary | ICD-10-CM | POA: Diagnosis not present

## 2018-05-04 DIAGNOSIS — G8929 Other chronic pain: Secondary | ICD-10-CM | POA: Diagnosis not present

## 2018-05-04 DIAGNOSIS — M48061 Spinal stenosis, lumbar region without neurogenic claudication: Secondary | ICD-10-CM | POA: Diagnosis not present

## 2018-05-04 DIAGNOSIS — M9953 Intervertebral disc stenosis of neural canal of lumbar region: Secondary | ICD-10-CM | POA: Diagnosis not present

## 2018-06-15 DIAGNOSIS — M48062 Spinal stenosis, lumbar region with neurogenic claudication: Secondary | ICD-10-CM | POA: Diagnosis not present

## 2018-06-15 DIAGNOSIS — M47816 Spondylosis without myelopathy or radiculopathy, lumbar region: Secondary | ICD-10-CM | POA: Diagnosis not present

## 2018-06-15 DIAGNOSIS — M545 Low back pain: Secondary | ICD-10-CM | POA: Diagnosis not present

## 2018-06-15 DIAGNOSIS — G8929 Other chronic pain: Secondary | ICD-10-CM | POA: Diagnosis not present

## 2018-06-15 DIAGNOSIS — G894 Chronic pain syndrome: Secondary | ICD-10-CM | POA: Diagnosis not present

## 2018-06-15 DIAGNOSIS — R29898 Other symptoms and signs involving the musculoskeletal system: Secondary | ICD-10-CM | POA: Diagnosis not present

## 2018-06-15 DIAGNOSIS — M48061 Spinal stenosis, lumbar region without neurogenic claudication: Secondary | ICD-10-CM | POA: Diagnosis not present

## 2018-06-15 DIAGNOSIS — M4686 Other specified inflammatory spondylopathies, lumbar region: Secondary | ICD-10-CM | POA: Diagnosis not present

## 2018-07-04 DIAGNOSIS — N390 Urinary tract infection, site not specified: Secondary | ICD-10-CM | POA: Diagnosis not present

## 2018-07-04 DIAGNOSIS — R339 Retention of urine, unspecified: Secondary | ICD-10-CM | POA: Diagnosis not present

## 2018-07-11 DIAGNOSIS — I4891 Unspecified atrial fibrillation: Secondary | ICD-10-CM | POA: Diagnosis not present

## 2018-07-11 DIAGNOSIS — E1129 Type 2 diabetes mellitus with other diabetic kidney complication: Secondary | ICD-10-CM | POA: Diagnosis not present

## 2018-07-11 DIAGNOSIS — E1159 Type 2 diabetes mellitus with other circulatory complications: Secondary | ICD-10-CM | POA: Diagnosis not present

## 2018-07-11 DIAGNOSIS — E785 Hyperlipidemia, unspecified: Secondary | ICD-10-CM | POA: Diagnosis not present

## 2018-07-11 DIAGNOSIS — J449 Chronic obstructive pulmonary disease, unspecified: Secondary | ICD-10-CM | POA: Diagnosis not present

## 2018-07-20 DIAGNOSIS — Z9181 History of falling: Secondary | ICD-10-CM | POA: Diagnosis not present

## 2018-07-20 DIAGNOSIS — Z1231 Encounter for screening mammogram for malignant neoplasm of breast: Secondary | ICD-10-CM | POA: Diagnosis not present

## 2018-07-20 DIAGNOSIS — E669 Obesity, unspecified: Secondary | ICD-10-CM | POA: Diagnosis not present

## 2018-07-20 DIAGNOSIS — E785 Hyperlipidemia, unspecified: Secondary | ICD-10-CM | POA: Diagnosis not present

## 2018-07-20 DIAGNOSIS — N959 Unspecified menopausal and perimenopausal disorder: Secondary | ICD-10-CM | POA: Diagnosis not present

## 2018-07-20 DIAGNOSIS — Z136 Encounter for screening for cardiovascular disorders: Secondary | ICD-10-CM | POA: Diagnosis not present

## 2018-07-20 DIAGNOSIS — Z1331 Encounter for screening for depression: Secondary | ICD-10-CM | POA: Diagnosis not present

## 2018-07-20 DIAGNOSIS — Z0000 Encounter for general adult medical examination without abnormal findings: Secondary | ICD-10-CM | POA: Diagnosis not present

## 2018-07-20 DIAGNOSIS — Z1339 Encounter for screening examination for other mental health and behavioral disorders: Secondary | ICD-10-CM | POA: Diagnosis not present

## 2018-08-08 DIAGNOSIS — N183 Chronic kidney disease, stage 3 (moderate): Secondary | ICD-10-CM | POA: Diagnosis not present

## 2018-08-10 DIAGNOSIS — E1129 Type 2 diabetes mellitus with other diabetic kidney complication: Secondary | ICD-10-CM | POA: Diagnosis not present

## 2018-08-10 DIAGNOSIS — E782 Mixed hyperlipidemia: Secondary | ICD-10-CM | POA: Diagnosis not present

## 2018-08-10 DIAGNOSIS — N183 Chronic kidney disease, stage 3 (moderate): Secondary | ICD-10-CM | POA: Diagnosis not present

## 2018-08-18 DIAGNOSIS — I129 Hypertensive chronic kidney disease with stage 1 through stage 4 chronic kidney disease, or unspecified chronic kidney disease: Secondary | ICD-10-CM | POA: Diagnosis not present

## 2018-08-18 DIAGNOSIS — N2581 Secondary hyperparathyroidism of renal origin: Secondary | ICD-10-CM | POA: Diagnosis not present

## 2018-08-18 DIAGNOSIS — N183 Chronic kidney disease, stage 3 (moderate): Secondary | ICD-10-CM | POA: Diagnosis not present

## 2018-08-18 DIAGNOSIS — D631 Anemia in chronic kidney disease: Secondary | ICD-10-CM | POA: Diagnosis not present

## 2018-08-22 DIAGNOSIS — M549 Dorsalgia, unspecified: Secondary | ICD-10-CM | POA: Diagnosis not present

## 2018-08-22 DIAGNOSIS — M48061 Spinal stenosis, lumbar region without neurogenic claudication: Secondary | ICD-10-CM | POA: Diagnosis not present

## 2018-09-28 DIAGNOSIS — M519 Unspecified thoracic, thoracolumbar and lumbosacral intervertebral disc disorder: Secondary | ICD-10-CM | POA: Diagnosis not present

## 2018-09-28 DIAGNOSIS — I482 Chronic atrial fibrillation, unspecified: Secondary | ICD-10-CM | POA: Insufficient documentation

## 2018-09-28 DIAGNOSIS — I10 Essential (primary) hypertension: Secondary | ICD-10-CM

## 2018-09-28 DIAGNOSIS — E119 Type 2 diabetes mellitus without complications: Secondary | ICD-10-CM

## 2018-09-28 DIAGNOSIS — M545 Low back pain: Secondary | ICD-10-CM | POA: Diagnosis not present

## 2018-09-28 DIAGNOSIS — F17200 Nicotine dependence, unspecified, uncomplicated: Secondary | ICD-10-CM | POA: Insufficient documentation

## 2018-09-28 DIAGNOSIS — R638 Other symptoms and signs concerning food and fluid intake: Secondary | ICD-10-CM | POA: Insufficient documentation

## 2018-09-28 DIAGNOSIS — I739 Peripheral vascular disease, unspecified: Secondary | ICD-10-CM | POA: Insufficient documentation

## 2018-09-28 DIAGNOSIS — J449 Chronic obstructive pulmonary disease, unspecified: Secondary | ICD-10-CM

## 2018-09-28 DIAGNOSIS — N189 Chronic kidney disease, unspecified: Secondary | ICD-10-CM

## 2018-09-28 DIAGNOSIS — M48061 Spinal stenosis, lumbar region without neurogenic claudication: Secondary | ICD-10-CM | POA: Diagnosis not present

## 2018-09-28 HISTORY — DX: Type 2 diabetes mellitus without complications: E11.9

## 2018-09-28 HISTORY — DX: Chronic kidney disease, unspecified: N18.9

## 2018-09-28 HISTORY — DX: Nicotine dependence, unspecified, uncomplicated: F17.200

## 2018-09-28 HISTORY — DX: Essential (primary) hypertension: I10

## 2018-09-28 HISTORY — DX: Other symptoms and signs concerning food and fluid intake: R63.8

## 2018-09-28 HISTORY — DX: Chronic atrial fibrillation, unspecified: I48.20

## 2018-09-28 HISTORY — DX: Chronic obstructive pulmonary disease, unspecified: J44.9

## 2018-09-28 HISTORY — DX: Peripheral vascular disease, unspecified: I73.9

## 2018-10-03 DIAGNOSIS — M545 Low back pain: Secondary | ICD-10-CM | POA: Diagnosis not present

## 2018-10-12 DIAGNOSIS — M545 Low back pain: Secondary | ICD-10-CM | POA: Diagnosis not present

## 2018-10-19 DIAGNOSIS — M545 Low back pain: Secondary | ICD-10-CM | POA: Diagnosis not present

## 2018-10-19 DIAGNOSIS — I10 Essential (primary) hypertension: Secondary | ICD-10-CM | POA: Diagnosis not present

## 2018-10-26 DIAGNOSIS — M545 Low back pain: Secondary | ICD-10-CM | POA: Diagnosis not present

## 2018-10-26 DIAGNOSIS — I10 Essential (primary) hypertension: Secondary | ICD-10-CM | POA: Diagnosis not present

## 2018-10-31 DIAGNOSIS — M4800 Spinal stenosis, site unspecified: Secondary | ICD-10-CM | POA: Diagnosis not present

## 2018-11-02 DIAGNOSIS — M545 Low back pain: Secondary | ICD-10-CM | POA: Diagnosis not present

## 2018-11-09 DIAGNOSIS — M545 Low back pain: Secondary | ICD-10-CM | POA: Diagnosis not present

## 2018-11-14 DIAGNOSIS — I1310 Hypertensive heart and chronic kidney disease without heart failure, with stage 1 through stage 4 chronic kidney disease, or unspecified chronic kidney disease: Secondary | ICD-10-CM | POA: Diagnosis not present

## 2018-11-14 DIAGNOSIS — E669 Obesity, unspecified: Secondary | ICD-10-CM | POA: Diagnosis not present

## 2018-11-14 DIAGNOSIS — E7800 Pure hypercholesterolemia, unspecified: Secondary | ICD-10-CM | POA: Diagnosis not present

## 2018-11-14 DIAGNOSIS — I4891 Unspecified atrial fibrillation: Secondary | ICD-10-CM | POA: Diagnosis not present

## 2018-11-14 DIAGNOSIS — N184 Chronic kidney disease, stage 4 (severe): Secondary | ICD-10-CM | POA: Diagnosis not present

## 2018-11-14 DIAGNOSIS — E1122 Type 2 diabetes mellitus with diabetic chronic kidney disease: Secondary | ICD-10-CM | POA: Diagnosis not present

## 2018-11-14 DIAGNOSIS — J449 Chronic obstructive pulmonary disease, unspecified: Secondary | ICD-10-CM | POA: Diagnosis not present

## 2018-11-14 DIAGNOSIS — N183 Chronic kidney disease, stage 3 (moderate): Secondary | ICD-10-CM | POA: Diagnosis not present

## 2018-11-14 DIAGNOSIS — M549 Dorsalgia, unspecified: Secondary | ICD-10-CM | POA: Diagnosis not present

## 2018-11-16 ENCOUNTER — Other Ambulatory Visit: Payer: Self-pay | Admitting: Cardiology

## 2018-11-16 DIAGNOSIS — M545 Low back pain: Secondary | ICD-10-CM | POA: Diagnosis not present

## 2018-11-16 DIAGNOSIS — I2510 Atherosclerotic heart disease of native coronary artery without angina pectoris: Secondary | ICD-10-CM

## 2018-11-17 DIAGNOSIS — M47816 Spondylosis without myelopathy or radiculopathy, lumbar region: Secondary | ICD-10-CM | POA: Diagnosis not present

## 2018-11-17 DIAGNOSIS — M51369 Other intervertebral disc degeneration, lumbar region without mention of lumbar back pain or lower extremity pain: Secondary | ICD-10-CM

## 2018-11-17 DIAGNOSIS — G8929 Other chronic pain: Secondary | ICD-10-CM | POA: Diagnosis not present

## 2018-11-17 DIAGNOSIS — M5136 Other intervertebral disc degeneration, lumbar region: Secondary | ICD-10-CM | POA: Diagnosis not present

## 2018-11-17 DIAGNOSIS — M47819 Spondylosis without myelopathy or radiculopathy, site unspecified: Secondary | ICD-10-CM | POA: Insufficient documentation

## 2018-11-17 DIAGNOSIS — M48062 Spinal stenosis, lumbar region with neurogenic claudication: Secondary | ICD-10-CM | POA: Diagnosis not present

## 2018-11-17 DIAGNOSIS — Z96653 Presence of artificial knee joint, bilateral: Secondary | ICD-10-CM

## 2018-11-17 HISTORY — DX: Spondylosis without myelopathy or radiculopathy, site unspecified: M47.819

## 2018-11-17 HISTORY — DX: Other intervertebral disc degeneration, lumbar region without mention of lumbar back pain or lower extremity pain: M51.369

## 2018-11-17 HISTORY — DX: Presence of artificial knee joint, bilateral: Z96.653

## 2018-11-17 HISTORY — DX: Other intervertebral disc degeneration, lumbar region: M51.36

## 2018-11-21 DIAGNOSIS — M545 Low back pain: Secondary | ICD-10-CM | POA: Diagnosis not present

## 2018-11-30 DIAGNOSIS — M47819 Spondylosis without myelopathy or radiculopathy, site unspecified: Secondary | ICD-10-CM | POA: Diagnosis not present

## 2018-11-30 DIAGNOSIS — M47816 Spondylosis without myelopathy or radiculopathy, lumbar region: Secondary | ICD-10-CM | POA: Diagnosis not present

## 2018-11-30 DIAGNOSIS — M5136 Other intervertebral disc degeneration, lumbar region: Secondary | ICD-10-CM | POA: Diagnosis not present

## 2018-11-30 DIAGNOSIS — G8929 Other chronic pain: Secondary | ICD-10-CM | POA: Diagnosis not present

## 2018-11-30 DIAGNOSIS — M48062 Spinal stenosis, lumbar region with neurogenic claudication: Secondary | ICD-10-CM | POA: Diagnosis not present

## 2018-11-30 DIAGNOSIS — M545 Low back pain: Secondary | ICD-10-CM | POA: Diagnosis not present

## 2018-12-08 DIAGNOSIS — M549 Dorsalgia, unspecified: Secondary | ICD-10-CM | POA: Diagnosis not present

## 2018-12-08 DIAGNOSIS — M48062 Spinal stenosis, lumbar region with neurogenic claudication: Secondary | ICD-10-CM | POA: Diagnosis not present

## 2018-12-08 DIAGNOSIS — M4316 Spondylolisthesis, lumbar region: Secondary | ICD-10-CM | POA: Diagnosis not present

## 2018-12-08 DIAGNOSIS — M5124 Other intervertebral disc displacement, thoracic region: Secondary | ICD-10-CM | POA: Diagnosis not present

## 2018-12-08 DIAGNOSIS — G8929 Other chronic pain: Secondary | ICD-10-CM | POA: Diagnosis not present

## 2018-12-08 DIAGNOSIS — M47819 Spondylosis without myelopathy or radiculopathy, site unspecified: Secondary | ICD-10-CM | POA: Diagnosis not present

## 2018-12-08 DIAGNOSIS — M545 Low back pain: Secondary | ICD-10-CM | POA: Diagnosis not present

## 2018-12-08 DIAGNOSIS — M5136 Other intervertebral disc degeneration, lumbar region: Secondary | ICD-10-CM | POA: Diagnosis not present

## 2018-12-08 DIAGNOSIS — M2548 Effusion, other site: Secondary | ICD-10-CM | POA: Diagnosis not present

## 2018-12-21 DIAGNOSIS — M48062 Spinal stenosis, lumbar region with neurogenic claudication: Secondary | ICD-10-CM | POA: Diagnosis not present

## 2018-12-21 DIAGNOSIS — M5441 Lumbago with sciatica, right side: Secondary | ICD-10-CM | POA: Diagnosis not present

## 2018-12-21 DIAGNOSIS — M5442 Lumbago with sciatica, left side: Secondary | ICD-10-CM | POA: Diagnosis not present

## 2018-12-21 DIAGNOSIS — M545 Low back pain: Secondary | ICD-10-CM | POA: Diagnosis not present

## 2018-12-21 DIAGNOSIS — M5136 Other intervertebral disc degeneration, lumbar region: Secondary | ICD-10-CM | POA: Diagnosis not present

## 2019-01-04 DIAGNOSIS — N390 Urinary tract infection, site not specified: Secondary | ICD-10-CM | POA: Diagnosis not present

## 2019-01-04 DIAGNOSIS — R339 Retention of urine, unspecified: Secondary | ICD-10-CM | POA: Diagnosis not present

## 2019-01-05 DIAGNOSIS — N390 Urinary tract infection, site not specified: Secondary | ICD-10-CM | POA: Diagnosis not present

## 2019-01-13 DIAGNOSIS — E559 Vitamin D deficiency, unspecified: Secondary | ICD-10-CM | POA: Diagnosis not present

## 2019-01-13 DIAGNOSIS — M7989 Other specified soft tissue disorders: Secondary | ICD-10-CM | POA: Diagnosis not present

## 2019-01-16 DIAGNOSIS — M7989 Other specified soft tissue disorders: Secondary | ICD-10-CM | POA: Diagnosis not present

## 2019-01-16 DIAGNOSIS — E559 Vitamin D deficiency, unspecified: Secondary | ICD-10-CM | POA: Diagnosis not present

## 2019-01-18 DIAGNOSIS — Z79899 Other long term (current) drug therapy: Secondary | ICD-10-CM | POA: Diagnosis not present

## 2019-01-18 DIAGNOSIS — M1710 Unilateral primary osteoarthritis, unspecified knee: Secondary | ICD-10-CM | POA: Diagnosis not present

## 2019-01-18 DIAGNOSIS — M7989 Other specified soft tissue disorders: Secondary | ICD-10-CM | POA: Diagnosis not present

## 2019-01-25 DIAGNOSIS — H40013 Open angle with borderline findings, low risk, bilateral: Secondary | ICD-10-CM | POA: Diagnosis not present

## 2019-01-25 DIAGNOSIS — E119 Type 2 diabetes mellitus without complications: Secondary | ICD-10-CM | POA: Diagnosis not present

## 2019-01-26 DIAGNOSIS — G8929 Other chronic pain: Secondary | ICD-10-CM | POA: Diagnosis not present

## 2019-01-26 DIAGNOSIS — M48062 Spinal stenosis, lumbar region with neurogenic claudication: Secondary | ICD-10-CM | POA: Diagnosis not present

## 2019-01-26 DIAGNOSIS — Z760 Encounter for issue of repeat prescription: Secondary | ICD-10-CM | POA: Diagnosis not present

## 2019-01-26 DIAGNOSIS — Z96653 Presence of artificial knee joint, bilateral: Secondary | ICD-10-CM | POA: Diagnosis not present

## 2019-01-26 DIAGNOSIS — Z79891 Long term (current) use of opiate analgesic: Secondary | ICD-10-CM | POA: Diagnosis not present

## 2019-01-26 DIAGNOSIS — M5136 Other intervertebral disc degeneration, lumbar region: Secondary | ICD-10-CM | POA: Diagnosis not present

## 2019-01-26 DIAGNOSIS — M47816 Spondylosis without myelopathy or radiculopathy, lumbar region: Secondary | ICD-10-CM | POA: Diagnosis not present

## 2019-01-30 DIAGNOSIS — G8929 Other chronic pain: Secondary | ICD-10-CM | POA: Diagnosis not present

## 2019-01-30 DIAGNOSIS — M47817 Spondylosis without myelopathy or radiculopathy, lumbosacral region: Secondary | ICD-10-CM

## 2019-01-30 HISTORY — DX: Spondylosis without myelopathy or radiculopathy, lumbosacral region: M47.817

## 2019-02-01 ENCOUNTER — Telehealth: Payer: Self-pay

## 2019-02-01 NOTE — Telephone Encounter (Signed)
Left message that patient needs to call back and schedule f/u visit for cardiac clearance.

## 2019-02-10 ENCOUNTER — Other Ambulatory Visit: Payer: Self-pay | Admitting: Cardiology

## 2019-02-10 DIAGNOSIS — I2510 Atherosclerotic heart disease of native coronary artery without angina pectoris: Secondary | ICD-10-CM

## 2019-02-10 NOTE — Telephone Encounter (Signed)
[facility] [place]

## 2019-02-17 ENCOUNTER — Encounter: Payer: Self-pay | Admitting: Cardiology

## 2019-02-17 ENCOUNTER — Other Ambulatory Visit: Payer: Self-pay

## 2019-02-17 ENCOUNTER — Ambulatory Visit (INDEPENDENT_AMBULATORY_CARE_PROVIDER_SITE_OTHER): Payer: Medicare HMO | Admitting: Cardiology

## 2019-02-17 VITALS — BP 118/58 | HR 74 | Ht <= 58 in | Wt 186.8 lb

## 2019-02-17 DIAGNOSIS — E782 Mixed hyperlipidemia: Secondary | ICD-10-CM

## 2019-02-17 DIAGNOSIS — I2510 Atherosclerotic heart disease of native coronary artery without angina pectoris: Secondary | ICD-10-CM | POA: Diagnosis not present

## 2019-02-17 DIAGNOSIS — E088 Diabetes mellitus due to underlying condition with unspecified complications: Secondary | ICD-10-CM

## 2019-02-17 DIAGNOSIS — I480 Paroxysmal atrial fibrillation: Secondary | ICD-10-CM

## 2019-02-17 DIAGNOSIS — I10 Essential (primary) hypertension: Secondary | ICD-10-CM | POA: Diagnosis not present

## 2019-02-17 NOTE — Progress Notes (Signed)
Cardiology Office Note:    Date:  02/17/2019   ID:  Lisa Maxwell, Nave May 18, 1945, MRN [id number]  PCP:  Nicoletta Dress, MD  Cardiologist:  Jenean Lindau, MD   Referring MD: Melony Overly, MD    ASSESSMENT:    1. Coronary artery disease involving native coronary artery of native heart without angina pectoris   2. Essential hypertension   3. PAF (paroxysmal atrial fibrillation) ([place])   4. Diabetes mellitus due to underlying condition with unspecified complications ([place])   5. Mixed dyslipidemia   6. Morbid obesity ([place])    PLAN:    In order of problems listed above:  Preoperative assessment: The patient plans to undergo injections for her back.  She is in significant pain and also it seems like she has nerve damage and the risk of permanent damage.  She denies any chest pain orthopnea or PND with activities of daily living which are minimal.  She is on anticoagulation for atrial fibrillation.  I discussed benefits and risks of stopping anticoagulation for the injection and she vocalized understanding.  I gave her options of heparin Lovenox bridging but she is not keen on it and I respect her wishes.  In this case I think anticoagulation can be withheld as felt appropriate by his surgeons who will be doing the injection.  Generally it is recommended to be held for 3 to 5 days but I will leave this up to the surgeons.  We can I told her that anticoagulation has to be reinitiated as soon as felt okay by her surgeons.  They will make that decision.  We are available should there be any questions about this management. Essential hypertension: Blood pressure is stable Mixed dyslipidemia diabetes mellitus and obesity: Diet was discussed weight reduction was stressed extensively and she vocalized understanding.  She promises to comply. Patient will be seen in follow-up appointment in 6 months or earlier if the patient has any concerns   Medication Adjustments/Labs and Tests Ordered:  Current medicines are reviewed at length with the patient today.  Concerns regarding medicines are outlined above.  No orders of the defined types were placed in this encounter.  No orders of the defined types were placed in this encounter.    Chief Complaint  Patient presents with  . Pre-op Exam  . Follow-up     History of Present Illness:    Lisa Maxwell is a 73 y.o. female.  Patient has past medical history of paroxysmal atrial fibrillation, essential hypertension, dyslipidemia diabetes mellitus and obesity.  She has significant back pain issues and plans to get injection.  She says that she has weakness in her lower extremities from nerve involvement also.  She denies any chest pain orthopnea or PND.  For the aforementioned issue she leads a sedentary lifestyle.  At the time of my evaluation, the patient is alert awake oriented and in no distress.  Past Medical History:  Diagnosis Date  . Cerebrovascular disease   . Diabetes ([place])   . Hypertension   . Osteoarthritis of left knee   . Sleep apnea     Past Surgical History:  Procedure Laterality Date  . total left knee      Current Medications: Current Meds  Medication Sig  . aspirin 81 MG tablet Take 81 mg by mouth daily.  . cholecalciferol (VITAMIN D) 1000 UNITS tablet Take 1,000 Units by mouth daily.  [name] ELIQUIS 5 MG TABS tablet TAKE 1 TABLET BY MOUTH 2  TIMES DAILY. NEED APPOINTMENT FOR FURTHER REFILLS  . furosemide (LASIX) 40 MG tablet Take 1 tablet by mouth daily.  [name] gabapentin (NEURONTIN) 300 MG capsule Take 600 mg by mouth daily.   [name] HYDROcodone-acetaminophen (NORCO/VICODIN) 5-325 MG tablet Take 1 tablet by mouth 2 (two) times daily as needed.  . lovastatin (MEVACOR) 20 MG tablet Take 20 mg by mouth at bedtime.  . nitroGLYCERIN (NITROSTAT) 0.4 MG SL tablet Place 1 tablet (0.4 mg total) under the tongue every 5 (five) minutes as needed for chest pain.  [name] PROAIR HFA 108 (90 Base) MCG/ACT inhaler Inhale 1 puff into  the lungs every 4 (four) hours as needed.   [name] spironolactone (ALDACTONE) 25 MG tablet Take 25 mg by mouth daily.  . TRADJENTA 5 MG TABS tablet Take 5 mg by mouth daily.  . traMADol (ULTRAM) 50 MG tablet Take 50 mg by mouth daily.  . TRULICITY 1.5 WE/9.9BZ SOPN   . vitamin B-12 (CYANOCOBALAMIN) 1000 MCG tablet Take 1,000 mcg by mouth daily.      Allergies:   Patient has no known allergies.   Social History   Socioeconomic History  . Marital status: Married    Spouse name: Not on file  . Number of children: Not on file  . Years of education: Not on file  . Highest education level: Not on file  Occupational History  . Not on file  Social Needs  . Financial resource strain: Not on file  . Food insecurity    Worry: Not on file    Inability: Not on file  . Transportation needs    Medical: Not on file    Non-medical: Not on file  Tobacco Use  . Smoking status: Current Every Day Smoker  . Smokeless tobacco: Never Used  Substance and Sexual Activity  . Alcohol use: No  . Drug use: No  . Sexual activity: Not on file  Lifestyle  . Physical activity    Days per week: Not on file    Minutes per session: Not on file  . Stress: Not on file  Relationships  . Social Herbalist on phone: Not on file    Gets together: Not on file    Attends religious service: Not on file    Active member of club or organization: Not on file    Attends meetings of clubs or organizations: Not on file    Relationship status: Not on file  Other Topics Concern  . Not on file  Social History Narrative  . Not on file     Family History: The patient's family history is not on file.  ROS:   Please see the history of present illness.    All other systems reviewed and are negative.  EKGs/Labs/Other Studies Reviewed:    The following studies were reviewed today: I discussed my findings with the patient at length.  EKG reveals sinus rhythm and nonspecific ST-T changes.   Recent Labs: No  results found for requested labs within last 8760 hours.  Recent Lipid Panel No results found for: CHOL, TRIG, HDL, CHOLHDL, VLDL, LDLCALC, LDLDIRECT  Physical Exam:    VS:  BP (!) 118/58   Pulse 74   Ht 4\' 10"  (1.473 m)   Wt 186 lb 12.8 oz (84.7 kg)   SpO2 99%   BMI 39.04 kg/m     Wt Readings from Last 3 Encounters:  02/17/19 186 lb 12.8 oz (84.7 kg)  02/28/18 187 lb (84.8 kg)  05/24/17 189 lb (85.7 kg)     GEN: Patient is in no acute distress HEENT: Normal NECK: No JVD; No carotid bruits LYMPHATICS: No lymphadenopathy CARDIAC: Hear sounds regular, 2/6 systolic murmur at the apex. RESPIRATORY:  Clear to auscultation without rales, wheezing or rhonchi  ABDOMEN: Soft, non-tender, non-distended MUSCULOSKELETAL:  No edema; No deformity  SKIN: Warm and dry NEUROLOGIC:  Alert and oriented x 3 PSYCHIATRIC:  Normal affect   Signed, Jenean Lindau, MD  02/17/2019 3:57 PM    [place]

## 2019-02-17 NOTE — Patient Instructions (Signed)
Medication Instructions:  Your physician recommends that you continue on your current medications as directed. Please refer to the Current Medication list given to you today.  *If you need a refill on your cardiac medications before your next appointment, please call your pharmacy*  Lab Work: NONE If you have labs (blood work) drawn today and your tests are completely normal, you will receive your results only by: . MyChart Message (if you have MyChart) OR . A paper copy in the mail If you have any lab test that is abnormal or we need to change your treatment, we will call you to review the results.  Testing/Procedures: You had an EKG performed today  Follow-Up: At [facility], you and your health needs are our priority.  As part of our continuing mission to provide you with exceptional heart care, we have created designated Provider Care Teams.  These Care Teams include your primary Cardiologist (physician) and Advanced Practice Providers (APPs -  Physician Assistants and Nurse Practitioners) who all work together to provide you with the care you need, when you need it.  Your next appointment:   6 month(s)  The format for your next appointment:   In Person  Provider:   Rajan Revankar, MD    

## 2019-03-07 DIAGNOSIS — M4800 Spinal stenosis, site unspecified: Secondary | ICD-10-CM | POA: Diagnosis not present

## 2019-03-16 DIAGNOSIS — G8929 Other chronic pain: Secondary | ICD-10-CM | POA: Diagnosis not present

## 2019-03-16 DIAGNOSIS — Z760 Encounter for issue of repeat prescription: Secondary | ICD-10-CM | POA: Diagnosis not present

## 2019-03-16 DIAGNOSIS — M48062 Spinal stenosis, lumbar region with neurogenic claudication: Secondary | ICD-10-CM | POA: Diagnosis not present

## 2019-03-16 DIAGNOSIS — M5136 Other intervertebral disc degeneration, lumbar region: Secondary | ICD-10-CM | POA: Diagnosis not present

## 2019-03-16 DIAGNOSIS — M47817 Spondylosis without myelopathy or radiculopathy, lumbosacral region: Secondary | ICD-10-CM | POA: Diagnosis not present

## 2019-03-16 DIAGNOSIS — Z96653 Presence of artificial knee joint, bilateral: Secondary | ICD-10-CM | POA: Diagnosis not present

## 2019-03-16 DIAGNOSIS — Z79891 Long term (current) use of opiate analgesic: Secondary | ICD-10-CM | POA: Diagnosis not present

## 2019-03-17 DIAGNOSIS — J449 Chronic obstructive pulmonary disease, unspecified: Secondary | ICD-10-CM | POA: Diagnosis not present

## 2019-03-17 DIAGNOSIS — G8929 Other chronic pain: Secondary | ICD-10-CM | POA: Diagnosis not present

## 2019-03-17 DIAGNOSIS — I4891 Unspecified atrial fibrillation: Secondary | ICD-10-CM | POA: Diagnosis not present

## 2019-03-17 DIAGNOSIS — N183 Chronic kidney disease, stage 3 unspecified: Secondary | ICD-10-CM | POA: Diagnosis not present

## 2019-03-17 DIAGNOSIS — I1310 Hypertensive heart and chronic kidney disease without heart failure, with stage 1 through stage 4 chronic kidney disease, or unspecified chronic kidney disease: Secondary | ICD-10-CM | POA: Diagnosis not present

## 2019-03-17 DIAGNOSIS — E7800 Pure hypercholesterolemia, unspecified: Secondary | ICD-10-CM | POA: Diagnosis not present

## 2019-03-17 DIAGNOSIS — N184 Chronic kidney disease, stage 4 (severe): Secondary | ICD-10-CM | POA: Diagnosis not present

## 2019-03-17 DIAGNOSIS — E1122 Type 2 diabetes mellitus with diabetic chronic kidney disease: Secondary | ICD-10-CM | POA: Diagnosis not present

## 2019-03-17 DIAGNOSIS — M549 Dorsalgia, unspecified: Secondary | ICD-10-CM | POA: Diagnosis not present

## 2019-03-20 DIAGNOSIS — M48062 Spinal stenosis, lumbar region with neurogenic claudication: Secondary | ICD-10-CM | POA: Diagnosis not present

## 2019-03-20 DIAGNOSIS — M4310 Spondylolisthesis, site unspecified: Secondary | ICD-10-CM

## 2019-03-20 DIAGNOSIS — M48061 Spinal stenosis, lumbar region without neurogenic claudication: Secondary | ICD-10-CM

## 2019-03-20 DIAGNOSIS — M47816 Spondylosis without myelopathy or radiculopathy, lumbar region: Secondary | ICD-10-CM

## 2019-03-20 DIAGNOSIS — M4316 Spondylolisthesis, lumbar region: Secondary | ICD-10-CM | POA: Diagnosis not present

## 2019-03-20 HISTORY — DX: Spinal stenosis, lumbar region without neurogenic claudication: M48.061

## 2019-03-20 HISTORY — DX: Spondylosis without myelopathy or radiculopathy, lumbar region: M47.816

## 2019-03-20 HISTORY — DX: Spondylolisthesis, site unspecified: M43.10

## 2019-04-13 DIAGNOSIS — Z760 Encounter for issue of repeat prescription: Secondary | ICD-10-CM | POA: Diagnosis not present

## 2019-04-13 DIAGNOSIS — Z96653 Presence of artificial knee joint, bilateral: Secondary | ICD-10-CM | POA: Diagnosis not present

## 2019-04-13 DIAGNOSIS — M48062 Spinal stenosis, lumbar region with neurogenic claudication: Secondary | ICD-10-CM | POA: Diagnosis not present

## 2019-04-13 DIAGNOSIS — Z79891 Long term (current) use of opiate analgesic: Secondary | ICD-10-CM | POA: Diagnosis not present

## 2019-04-13 DIAGNOSIS — G8929 Other chronic pain: Secondary | ICD-10-CM | POA: Diagnosis not present

## 2019-04-13 DIAGNOSIS — Z0289 Encounter for other administrative examinations: Secondary | ICD-10-CM | POA: Insufficient documentation

## 2019-04-13 HISTORY — DX: Encounter for other administrative examinations: Z02.89

## 2019-04-26 DIAGNOSIS — M48062 Spinal stenosis, lumbar region with neurogenic claudication: Secondary | ICD-10-CM | POA: Diagnosis not present

## 2019-04-27 DIAGNOSIS — N183 Chronic kidney disease, stage 3 unspecified: Secondary | ICD-10-CM | POA: Diagnosis not present

## 2019-04-28 ENCOUNTER — Telehealth: Payer: Self-pay | Admitting: Cardiology

## 2019-04-28 DIAGNOSIS — Z01810 Encounter for preprocedural cardiovascular examination: Secondary | ICD-10-CM

## 2019-04-28 DIAGNOSIS — I2510 Atherosclerotic heart disease of native coronary artery without angina pectoris: Secondary | ICD-10-CM

## 2019-04-28 NOTE — Telephone Encounter (Signed)
Please set her up for a Lexiscan.  Thank

## 2019-04-28 NOTE — Telephone Encounter (Signed)
New Message:     Pt says she is having a procedure. She said her surgeon said that she need  A Stress test before her surgery,

## 2019-04-28 NOTE — Telephone Encounter (Signed)
Spoke with pt who states she is planning on having a spinal stimulator implant procedure done in the near future. The actual date is still TBD. She states her neurosurgeon is requesting she has a lexiscan performed prior to surgery.  I advised pt her surgeon's office should also send over a surgical request form if a lexiscan is needed. Her surgeon is Dr Clydell Hakim of [facility].   I will forward to Dr. Geraldo Pitter and his RN for further follow up to discuss.  She had no additional needs at this time.

## 2019-05-01 DIAGNOSIS — Z01810 Encounter for preprocedural cardiovascular examination: Secondary | ICD-10-CM

## 2019-05-01 HISTORY — DX: Encounter for preprocedural cardiovascular examination: Z01.810

## 2019-05-01 NOTE — Telephone Encounter (Signed)
Called patient to inform her that her request has been submitted. [place] schedule if out to 06/07/19 referral sent to [facility] or [facility] to schedule lexi. No further questions at this time.

## 2019-05-01 NOTE — Telephone Encounter (Signed)
Patient scheduled for 05/04/19 and RN called to review lexi letter with her over phone. All questions answered.

## 2019-05-01 NOTE — Addendum Note (Signed)
Addended by: Beckey Rutter on: 05/01/2019 02:44 PM   Modules accepted: Orders

## 2019-05-02 ENCOUNTER — Telehealth (HOSPITAL_COMMUNITY): Payer: Self-pay

## 2019-05-02 DIAGNOSIS — D631 Anemia in chronic kidney disease: Secondary | ICD-10-CM | POA: Diagnosis not present

## 2019-05-02 DIAGNOSIS — E669 Obesity, unspecified: Secondary | ICD-10-CM | POA: Diagnosis not present

## 2019-05-02 DIAGNOSIS — I4891 Unspecified atrial fibrillation: Secondary | ICD-10-CM | POA: Diagnosis not present

## 2019-05-02 DIAGNOSIS — E1122 Type 2 diabetes mellitus with diabetic chronic kidney disease: Secondary | ICD-10-CM | POA: Diagnosis not present

## 2019-05-02 DIAGNOSIS — Z7901 Long term (current) use of anticoagulants: Secondary | ICD-10-CM | POA: Diagnosis not present

## 2019-05-02 DIAGNOSIS — I129 Hypertensive chronic kidney disease with stage 1 through stage 4 chronic kidney disease, or unspecified chronic kidney disease: Secondary | ICD-10-CM | POA: Diagnosis not present

## 2019-05-02 DIAGNOSIS — N183 Chronic kidney disease, stage 3 unspecified: Secondary | ICD-10-CM | POA: Diagnosis not present

## 2019-05-02 DIAGNOSIS — N2581 Secondary hyperparathyroidism of renal origin: Secondary | ICD-10-CM | POA: Diagnosis not present

## 2019-05-02 NOTE — Telephone Encounter (Signed)
Spoke with the patient, instructions given. She stated that she understood and would be here for her test on Thursday. Asked to call back with any questions. S.Chelcee Korpi EMTP

## 2019-05-04 ENCOUNTER — Ambulatory Visit (HOSPITAL_COMMUNITY): Payer: Medicare HMO | Attending: Cardiovascular Disease

## 2019-05-04 ENCOUNTER — Other Ambulatory Visit: Payer: Self-pay

## 2019-05-04 DIAGNOSIS — I2510 Atherosclerotic heart disease of native coronary artery without angina pectoris: Secondary | ICD-10-CM | POA: Diagnosis not present

## 2019-05-04 DIAGNOSIS — Z01810 Encounter for preprocedural cardiovascular examination: Secondary | ICD-10-CM | POA: Insufficient documentation

## 2019-05-04 DIAGNOSIS — I480 Paroxysmal atrial fibrillation: Secondary | ICD-10-CM | POA: Diagnosis not present

## 2019-05-04 MED ORDER — TECHNETIUM TC 99M TETROFOSMIN IV KIT
32.7000 | PACK | Freq: Once | INTRAVENOUS | Status: AC | PRN
  Administered 2019-05-04: 32.7 via INTRAVENOUS
  Filled 2019-05-04: qty 33

## 2019-05-04 MED ORDER — REGADENOSON 0.4 MG/5ML IV SOLN
0.4000 mg | Freq: Once | INTRAVENOUS | Status: AC
  Administered 2019-05-04: 0.4 mg via INTRAVENOUS

## 2019-05-08 ENCOUNTER — Encounter (INDEPENDENT_AMBULATORY_CARE_PROVIDER_SITE_OTHER): Payer: Self-pay

## 2019-05-08 ENCOUNTER — Other Ambulatory Visit: Payer: Self-pay

## 2019-05-08 ENCOUNTER — Ambulatory Visit (HOSPITAL_COMMUNITY): Payer: Medicare HMO | Attending: Cardiovascular Disease

## 2019-05-08 LAB — MYOCARDIAL PERFUSION IMAGING
LV dias vol: 45 mL (ref 46–106)
LV sys vol: 12 mL
Peak HR: 92 {beats}/min
Rest HR: 72 {beats}/min
SDS: 1
SRS: 1
SSS: 1
TID: 0.93

## 2019-05-08 MED ORDER — TECHNETIUM TC 99M TETROFOSMIN IV KIT
31.4000 | PACK | Freq: Once | INTRAVENOUS | Status: AC | PRN
  Administered 2019-05-08: 31.4 via INTRAVENOUS
  Filled 2019-05-08: qty 32

## 2019-05-09 ENCOUNTER — Telehealth: Payer: Self-pay

## 2019-05-09 NOTE — Telephone Encounter (Signed)
Left voicemail message for pt to call back.

## 2019-05-09 NOTE — Telephone Encounter (Signed)
-----   Message from Jenean Lindau, MD sent at 05/08/2019  1:22 PM EST ----- The results of the study is unremarkable. Please inform patient. I will discuss in detail at next appointment. Cc  primary care/referring physician Jenean Lindau, MD 05/08/2019 1:21 PM

## 2019-05-09 NOTE — Telephone Encounter (Signed)
Pt called back and results were given to the pt.  Pt verbalized understanding and had no questions.

## 2019-05-10 ENCOUNTER — Other Ambulatory Visit: Payer: Self-pay

## 2019-05-10 DIAGNOSIS — I2510 Atherosclerotic heart disease of native coronary artery without angina pectoris: Secondary | ICD-10-CM

## 2019-05-10 MED ORDER — ELIQUIS 5 MG PO TABS: ORAL_TABLET | ORAL | 0 refills | Status: DC

## 2019-05-10 NOTE — Progress Notes (Signed)
Received pharmacy refill request for Eliquis 5 MG. Sent to pharmacy [facility] [place].

## 2019-05-16 DIAGNOSIS — I129 Hypertensive chronic kidney disease with stage 1 through stage 4 chronic kidney disease, or unspecified chronic kidney disease: Secondary | ICD-10-CM | POA: Diagnosis not present

## 2019-05-29 DIAGNOSIS — G8929 Other chronic pain: Secondary | ICD-10-CM | POA: Diagnosis not present

## 2019-06-01 ENCOUNTER — Telehealth: Payer: Self-pay | Admitting: Cardiology

## 2019-06-01 NOTE — Telephone Encounter (Signed)
Pt takes Eliquis for afib with CHADS2VASc score of 5 (age, sex, HTN, DM, CAD). SCr 1.49 in Jan 2021, CrCl 91mL/min. Ok to hold Eliquis for 3 days as requested.

## 2019-06-01 NOTE — Telephone Encounter (Signed)
   [facility] Pre-operative Risk Assessment    Request for surgical clearance:  1. What type of surgery is being performed? Spinal cord stimulator  2. When is this surgery scheduled? 06/15/19  3. What type of clearance is required (medical clearance vs. Pharmacy clearance to hold med vs. Both)? pharmacy  4. Are there any medications that need to be held prior to surgery and how long? eliquis for 3 days  5. Practice name and name of physician performing surgery? [facility], Dr. Clydell Hakim  6. What is your office phone number: [phone]   7.   What is your office fax number: [phone]  8.   Anesthesia type (None, local, MAC, general) ? none   Selena Zobro 06/01/2019, 9:30 AM  _________________________________________________________________   (provider comments below)

## 2019-06-06 ENCOUNTER — Other Ambulatory Visit: Payer: Self-pay | Admitting: Cardiology

## 2019-06-06 DIAGNOSIS — I2510 Atherosclerotic heart disease of native coronary artery without angina pectoris: Secondary | ICD-10-CM

## 2019-06-15 DIAGNOSIS — G894 Chronic pain syndrome: Secondary | ICD-10-CM | POA: Diagnosis not present

## 2019-06-15 DIAGNOSIS — M48062 Spinal stenosis, lumbar region with neurogenic claudication: Secondary | ICD-10-CM | POA: Diagnosis not present

## 2019-06-22 ENCOUNTER — Other Ambulatory Visit: Payer: Self-pay | Admitting: Anesthesiology

## 2019-06-22 DIAGNOSIS — M5416 Radiculopathy, lumbar region: Secondary | ICD-10-CM | POA: Diagnosis not present

## 2019-06-22 DIAGNOSIS — M48062 Spinal stenosis, lumbar region with neurogenic claudication: Secondary | ICD-10-CM | POA: Diagnosis not present

## 2019-06-22 DIAGNOSIS — M4316 Spondylolisthesis, lumbar region: Secondary | ICD-10-CM | POA: Diagnosis not present

## 2019-06-29 ENCOUNTER — Telehealth: Payer: Self-pay | Admitting: *Deleted

## 2019-06-29 NOTE — Telephone Encounter (Signed)
Patient with diagnosis of afib on Eliquis for anticoagulation.    Procedure:  LUMBAR SPINAL CORD STIMULATOR INSERTION-PERMANENT PLACEMENT  Date of procedure: 07/07/19  CHADS2-VASc score of  5 (HTN, AGE, DM2, CAD, female)  CrCl 31 ml/min  Per office protocol, patient can hold Eliquis for 3 days prior to procedure.

## 2019-06-29 NOTE — Telephone Encounter (Signed)
   [facility] Pre-operative Risk Assessment    Request for surgical clearance:  1. What type of surgery is being performed? LUMBAR SPINAL CORD STIMULATOR INSERTION-PERMANENT PLACEMENT   2. When is this surgery scheduled? 07/07/19   3. What type of clearance is required (medical clearance vs. Pharmacy clearance to hold med vs. Both)? BOTH  4. Are there any medications that need to be held prior to surgery and how long? ELIQUIS   5. Practice name and name of physician performing surgery? [facility]; DR. PAUL HARKINS   6. What is your office phone number [phone]    7.   What is your office fax number [phone] ATTN: JESSICA  8.   Anesthesia type (None, local, MAC, general) ? GENERAL   Lisa Maxwell 06/29/2019, 12:12 PM  _________________________________________________________________   (provider comments below)

## 2019-06-30 NOTE — Telephone Encounter (Signed)
   Primary Cardiologist: Jenean Lindau, MD  Chart reviewed as part of pre-operative protocol coverage. Patient was contacted 06/30/2019 in reference to pre-operative risk assessment for pending surgery as outlined below.  NALY SCHWANZ was last seen on 02/17/2019 by Dr. Geraldo Pitter.  Since that day, Lisa Maxwell has done well with no new cardiac complaints. She had a normal cardiac stress test on 05/08/19.  Therefore, based on ACC/AHA guidelines, the patient would be at acceptable risk for the planned procedure without further cardiovascular testing.   According to our pharmacy protocol: Patient with diagnosis of afib on Eliquis for anticoagulation.   Procedure: LUMBAR SPINAL CORD STIMULATOR INSERTION-PERMANENT PLACEMENT Date of procedure: 07/07/19 CHADS2-VASc score of  5 (HTN, AGE, DM2, CAD, female) CrCl 31 ml/min  Per office protocol, patient can hold Eliquis for 3 days prior to procedure.    I will route this recommendation to the requesting party via Epic fax function and remove from pre-op pool.  Please call with questions.  Daune Perch, NP 06/30/2019, 11:51 AM

## 2019-07-03 DIAGNOSIS — N390 Urinary tract infection, site not specified: Secondary | ICD-10-CM | POA: Diagnosis not present

## 2019-07-03 DIAGNOSIS — R339 Retention of urine, unspecified: Secondary | ICD-10-CM | POA: Diagnosis not present

## 2019-07-03 NOTE — Progress Notes (Signed)
[id number] [place], [place] - [address] [facility] [place] [address] Phone: [phone] Fax: [phone]      Your procedure is scheduled on July 07, 2019.  Report to [facility] Main Entrance "A" at 5:30 A.M., and check in at the Admitting office.  Call this number if you have problems the morning of surgery:  [phone]  Call [phone] if you have any questions prior to your surgery date Monday-Friday 8am-4pm    Remember:  Do not eat or drink after midnight the night before your surgery     Take these medicines the morning of surgery with A SIP OF WATER: carvedilol (COREG)  loratadine (CLARITIN) umeclidinium-vilanterol (ANORO ELLIPTA) HYDROcodone-acetaminophen (NORCO/VICODIN) - as needed nitroGLYCERIN (NITROSTAT) - as needed PROAIR HFA 108 (90 Base) - Inhaler as needed  Hold Eliquis 3 days prior to surgery.  Last dose on 07/04/19.  Follow your surgeon's instructions on when to stop Aspirin.  If no instructions were given by your surgeon then you will need to call the office to get those instructions.     As of today, stop taking all Aspirin (unless instructed by your doctor) and other Aspirin containing products, Vitamins, Fish Oils, and Herbal Medications. Also stop all NSAIDS i.e. Advil, Ibuprofen, Motrin, Aleve, Anaprox, Naproxen, BC, Goody Powders, and all Supplements.   WHAT DO I DO ABOUT MY DIABETES MEDICATION?   . DO NOT take oral diabetes medicines (pills) the morning of surgery.  DO NOT take Trulicity morning of surgery.  HOW TO MANAGE YOUR DIABETES BEFORE AND AFTER SURGERY  Why is it important to control my blood sugar before and after surgery? . Improving blood sugar levels before and after surgery helps healing and can limit problems. . A way of improving blood sugar control is eating a healthy diet by: o  Eating less sugar and carbohydrates o  Increasing activity/exercise o  Talking with your doctor about reaching your  blood sugar goals . High blood sugars (greater than 180 mg/dL) can raise your risk of infections and slow your recovery, so you will need to focus on controlling your diabetes during the weeks before surgery. . Make sure that the doctor who takes care of your diabetes knows about your planned surgery including the date and location.  How do I manage my blood sugar before surgery? . Check your blood sugar at least 4 times a day, starting 2 days before surgery, to make sure that the level is not too high or low. . Check your blood sugar the morning of your surgery when you wake up and every 2 hours until you get to the Short Stay unit. o If your blood sugar is less than 70 mg/dL, you will need to treat for low blood sugar: - Do not take insulin. - Treat a low blood sugar (less than 70 mg/dL) with  cup of clear juice (cranberry or apple), 4 glucose tablets, OR glucose gel. - Recheck blood sugar in 15 minutes after treatment (to make sure it is greater than 70 mg/dL). If your blood sugar is not greater than 70 mg/dL on recheck, call [phone] for further instructions. . Report your blood sugar to the short stay nurse when you get to Short Stay.  . If you are admitted to the hospital after surgery: o Your blood sugar will be checked by the staff and you will probably be given insulin after surgery (instead of oral diabetes medicines) to make sure you have good blood sugar  levels. o The goal for blood sugar control after surgery is 80-180 mg/dL.   No Smoking of any kind, Tobacco, or Alcohol products 24 hours prior to your procedure. If you use a CPAP at night, you may bring all equipment for your overnight stay.                        Do not wear jewelry, make up, or nail polish            Do not wear lotions, powders, perfumes or deodorant.            Do not shave 48 hours prior to surgery.              Do not bring valuables to the hospital.            [facility] is not responsible for any  belongings or valuables.   Contacts, glasses, dentures or bridgework may not be worn into surgery.      For patients admitted to the hospital, discharge time will be determined by your treatment team.   Patients discharged the day of surgery will not be allowed to drive home, and someone needs to stay with them for 24 hours.    Special instructions:   [place]- Preparing For Surgery  Before surgery, you can play an important role. Because skin is not sterile, your skin needs to be as free of germs as possible. You can reduce the number of germs on your skin by washing with CHG (chlorahexidine gluconate) Soap before surgery.  CHG is an antiseptic cleaner which kills germs and bonds with the skin to continue killing germs even after washing.    Oral Hygiene is also important to reduce your risk of infection.  Remember - BRUSH YOUR TEETH THE MORNING OF SURGERY WITH YOUR REGULAR TOOTHPASTE  Please do not use if you have an allergy to CHG or antibacterial soaps. If your skin becomes reddened/irritated stop using the CHG.  Do not shave (including legs and underarms) for at least 48 hours prior to first CHG shower. It is OK to shave your face.  Please follow these instructions carefully.   1. Shower the NIGHT BEFORE SURGERY and the MORNING OF SURGERY with CHG Soap.   2. If you chose to wash your hair, wash your hair first as usual with your normal shampoo.  3. After you shampoo, rinse your hair and body thoroughly to remove the shampoo.  4. Use CHG as you would any other liquid soap. You can apply CHG directly to the skin and wash gently with a scrungie or a clean washcloth.   5. Apply the CHG Soap to your body ONLY FROM THE NECK DOWN.  Do not use on open wounds or open sores. Avoid contact with your eyes, ears, mouth and genitals (private parts). Wash Face and genitals (private parts)  with your normal soap.   6. Wash thoroughly, paying special attention to the area where your surgery  will be performed.  7. Thoroughly rinse your body with warm water from the neck down.  8. DO NOT shower/wash with your normal soap after using and rinsing off the CHG Soap.  9. Pat yourself dry with a CLEAN TOWEL.  10. Wear CLEAN PAJAMAS to bed the night before surgery, wear comfortable clothes the morning of surgery  11. Place CLEAN SHEETS on your bed the night of your first shower and DO NOT SLEEP WITH PETS.  Day of Surgery:   Do not apply any deodorants/lotions.  Please wear clean clothes to the hospital/surgery center.   Remember to brush your teeth WITH YOUR REGULAR TOOTHPASTE.   Please read over the following fact sheets that you were given.

## 2019-07-04 ENCOUNTER — Encounter (HOSPITAL_COMMUNITY)
Admission: RE | Admit: 2019-07-04 | Discharge: 2019-07-04 | Disposition: A | Discharge status: Home / Self Care | Payer: Medicare HMO | Source: Ambulatory Visit | Attending: Anesthesiology | Admitting: Anesthesiology

## 2019-07-04 ENCOUNTER — Encounter (HOSPITAL_COMMUNITY): Payer: Self-pay

## 2019-07-04 ENCOUNTER — Other Ambulatory Visit (HOSPITAL_COMMUNITY)
Admission: RE | Admit: 2019-07-04 | Discharge: 2019-07-04 | Disposition: A | Discharge status: Home / Self Care | Payer: Medicare HMO | Source: Ambulatory Visit | Attending: Anesthesiology | Admitting: Anesthesiology

## 2019-07-04 ENCOUNTER — Other Ambulatory Visit: Payer: Self-pay

## 2019-07-04 DIAGNOSIS — Z01812 Encounter for preprocedural laboratory examination: Secondary | ICD-10-CM | POA: Diagnosis not present

## 2019-07-04 DIAGNOSIS — Z20822 Contact with and (suspected) exposure to covid-19: Secondary | ICD-10-CM | POA: Insufficient documentation

## 2019-07-04 HISTORY — DX: Cardiac arrhythmia, unspecified: I49.9

## 2019-07-04 HISTORY — DX: Cardiac murmur, unspecified: R01.1

## 2019-07-04 HISTORY — DX: Malignant (primary) neoplasm, unspecified: C80.1

## 2019-07-04 HISTORY — DX: Chronic kidney disease, unspecified: N18.9

## 2019-07-04 LAB — BASIC METABOLIC PANEL
Anion gap: 8 (ref 5–15)
BUN: 26 mg/dL — ABNORMAL HIGH (ref 8–23)
CO2: 28 mmol/L (ref 22–32)
Calcium: 10 mg/dL (ref 8.9–10.3)
Chloride: 101 mmol/L (ref 98–111)
Creatinine, Ser: 1.67 mg/dL — ABNORMAL HIGH (ref 0.44–1.00)
GFR calc Af Amer: 35 mL/min — ABNORMAL LOW (ref >60)
GFR calc non Af Amer: 30 mL/min — ABNORMAL LOW (ref >60)
Glucose, Bld: 157 mg/dL — ABNORMAL HIGH (ref 70–99)
Potassium: 4.9 mmol/L (ref 3.5–5.1)
Sodium: 137 mmol/L (ref 135–145)

## 2019-07-04 LAB — SURGICAL PCR SCREEN
MRSA, PCR: NEGATIVE
Staphylococcus aureus: NEGATIVE

## 2019-07-04 LAB — CBC
HCT: 36.2 % (ref 36.0–46.0)
Hemoglobin: 11.1 g/dL — ABNORMAL LOW (ref 12.0–15.0)
MCH: 29.7 pg (ref 26.0–34.0)
MCHC: 30.7 g/dL (ref 30.0–36.0)
MCV: 96.8 fL (ref 80.0–100.0)
Platelets: 236 10*3/uL (ref 150–400)
RBC: 3.74 MIL/uL — ABNORMAL LOW (ref 3.87–5.11)
RDW: 13.1 % (ref 11.5–15.5)
WBC: 6.8 10*3/uL (ref 4.0–10.5)
nRBC: 0 % (ref 0.0–0.2)

## 2019-07-04 LAB — HEMOGLOBIN A1C
Hgb A1c MFr Bld: 6.7 % — ABNORMAL HIGH (ref 4.8–5.6)
Mean Plasma Glucose: 145.59 mg/dL

## 2019-07-04 LAB — SARS CORONAVIRUS 2 (TAT 6-24 HRS): SARS Coronavirus 2: NEGATIVE

## 2019-07-04 LAB — GLUCOSE, CAPILLARY: Glucose-Capillary: 175 mg/dL — ABNORMAL HIGH (ref 70–99)

## 2019-07-04 NOTE — Progress Notes (Signed)
PCP:  Nelda Bucks, MD Cardiologist:  Jyl Heinz, MD  EKG:  02/17/19 CXR:  12/06/08 ECHO:  05/14/15 Care Everywhere Stress Test:  05/08/19.  Patient states stress test was required for surgery.   Cardiac Cath:  Denies  Fasting Blood Sugar-120-180 Checks Blood Sugar__1_ times a day  Anesthesia Review:  Yes, cardiac history.  3/25 note by cardiology sated to stop Eliquis 3 days prior to surgery.  Patient states her last dose was 07/03/19.  Per note, patient is cleared by cards.   Covid test 07/04/19  Patient denies shortness of breath, fever, cough, and chest pain at PAT appointment.  Patient verbalized understanding of instructions provided today at the PAT appointment.  Patient asked to review instructions at home and day of surgery.

## 2019-07-05 NOTE — Progress Notes (Addendum)
Anesthesia Chart Review:  Follows with cardiology for history of afib on eliquis. Cardiac clearance per telephone encounter 06/30/19, "Patient was contacted 06/30/2019 in reference to pre-operative risk assessment for pending surgery as outlined below.  Lisa Maxwell was last seen on 02/17/2019 by Dr. Geraldo Pitter.  Since that day, Lisa Maxwell has done well with no new cardiac complaints. She had a normal cardiac stress test on 05/08/19. Therefore, based on ACC/AHA guidelines, the patient would be at acceptable risk for the planned procedure without further cardiovascular testing.   According to our pharmacy protocol: Patient with diagnosis ofafibon Eliquisfor anticoagulation.  Procedure:LUMBAR SPINAL CORD STIMULATOR INSERTION-PERMANENT PLACEMENT Date of procedure:07/07/19 CHADS2-VASc score of5 (HTN, AGE, DM2, CAD,female) CrCl31 ml/min  Per office protocol, patient can holdEliquisfor 3days prior to procedure."  Pt did have an echo in 2017 that Dr. Geraldo Pitter commented was unremarkable, however results are not available in care everywhere (listed as being done at both [facility] and [facility], records requested).  DMII well controlled A1c 6.7 on 07/04/19.  COPD on Anoro followed by PCP, stable per last OV note 03/17/19  CKD III, creatinine 1.67 on preop labs. Stable from last labs done by PCP 03/17/19 showing creatine 1.72.  Mild anemia with Hgb 11.1.  EKG 02/17/19: NSR with sinus arrhythmia. Rate 76. RBBB.  Nuclear stress 05/08/19: Normal resting and stress perfusion. No ischemia or infarction EF 73%   Wynonia Musty [facility] Short Stay Center/Anesthesiology Phone [phone] 07/05/2019 2:45 PM

## 2019-07-05 NOTE — Anesthesia Preprocedure Evaluation (Addendum)
Anesthesia Evaluation  Patient identified by MRN, date of birth, ID band Patient awake    Reviewed: Allergy & Precautions, NPO status , Patient's Chart, lab work & pertinent test results  Airway Mallampati: II  TM Distance: >3 FB Neck ROM: Full    Dental  (+) Edentulous Upper, Edentulous Lower   Pulmonary former smoker,    breath sounds clear to auscultation       Cardiovascular hypertension,  Rhythm:Irregular     Neuro/Psych    GI/Hepatic   Endo/Other  diabetes  Renal/GU      Musculoskeletal   Abdominal (+) + obese,   Peds  Hematology   Anesthesia Other Findings   Reproductive/Obstetrics                            Anesthesia Physical Anesthesia Plan  ASA: III  Anesthesia Plan: MAC   Post-op Pain Management:    Induction: Intravenous  PONV Risk Score and Plan: Ondansetron and Propofol infusion  Airway Management Planned: Natural Airway and Simple Face Mask  Additional Equipment:   Intra-op Plan:   Post-operative Plan:   Informed Consent: I have reviewed the patients History and Physical, chart, labs and discussed the procedure including the risks, benefits and alternatives for the proposed anesthesia with the patient or authorized representative who has indicated his/her understanding and acceptance.     Dental advisory given  Plan Discussed with:   Anesthesia Plan Comments: (Follows with cardiology for history of afib on eliquis. Cardiac clearance per telephone encounter 06/30/19, "Patient was contacted 06/30/2019 in reference to pre-operative risk assessment for pending surgery as outlined below.  Lisa Maxwell was last seen on 02/17/2019 by Dr. Geraldo Pitter.  Since that day, Lisa Maxwell has done well with no new cardiac complaints. She had a normal cardiac stress test on 05/08/19. Therefore, based on ACC/AHA guidelines, the patient would be at acceptable risk for the planned  procedure without further cardiovascular testing.   According to our pharmacy protocol: Patient with diagnosis ofafibon Eliquisfor anticoagulation.  Procedure:LUMBAR SPINAL CORD STIMULATOR INSERTION-PERMANENT PLACEMENT Date of procedure:07/07/19 CHADS2-VASc score of5 (HTN, AGE, DM2, CAD,female) CrCl31 ml/min  Per office protocol, patient can holdEliquisfor 3days prior to procedure."  Pt did have an echo in 2017 that Dr. Geraldo Pitter commented was unremarkable, however results are not available in care everywhere (listed as being done at both [facility] and [place], records requested).  DMII well controlled A1c 6.7 on 07/04/19.  COPD on Anoro followed by PCP, stable per last OV note 03/17/19  CKD III, creatinine 1.67 on preop labs. Stable from last labs done by PCP 03/17/19 showing creatine 1.72.  Mild anemia with Hgb 11.1.  EKG 02/17/19: NSR with sinus arrhythmia. Rate 76. RBBB.  Nuclear stress 05/08/19: Normal resting and stress perfusion. No ischemia or infarction EF 73% )      Anesthesia Quick Evaluation

## 2019-07-06 NOTE — H&P (Signed)
Lisa Maxwell is an 74 y.o. female.   Chief Complaint: Back pain with radiation into the legs right greater than left HPI:    74 year old woman referred to our practice with ongoing low back and predominantly right leg pain several months ago.  She has significant stenosis at the L4-5 level, and somewhat at the L5-S1 level; because of other comorbidities her consulting neurosurgeon felt she might be better served with spinal cord stimulator therapy and referred her to our practice.  Patient underwent a psychological evaluation, was found to be a good candidate from that perspective, and subsequently underwent spinal cord stimulation trial with 65% improvement in her overall pain symptoms, significantly improved quality of life  And ability to accomplish ADLs.   She now presents for permanent implantation  Past Medical History:  Diagnosis Date  . Cancer ([place])    skin cancer  . Cerebrovascular disease   . Chronic kidney disease   . Diabetes ([place])   . Dysrhythmia    afib  . Heart murmur   . Hypertension   . Osteoarthritis of left knee   . Sleep apnea     Past Surgical History:  Procedure Laterality Date  . ABDOMINAL HYSTERECTOMY    . JOINT REPLACEMENT Bilateral    knees  . total left knee      No family history on file. Social History:  reports that she quit smoking about 8 weeks ago. She has never used smokeless tobacco. She reports that she does not drink alcohol or use drugs.  Allergies: No Known Allergies  Medications Prior to Admission  Medication Sig Dispense Refill  . aspirin 81 MG tablet Take 81 mg by mouth daily.    . carvedilol (COREG) 12.5 MG tablet Take 12.5 mg by mouth 2 (two) times daily.    [name] ELIQUIS 5 MG TABS tablet TAKE 1 TABLET BY MOUTH 2 TIMES DAILY (Patient taking differently: Take 5 mg by mouth 2 (two) times daily. ) 180 tablet 1  . furosemide (LASIX) 40 MG tablet Take 40 mg by mouth daily.     [name] gabapentin (NEURONTIN) 300 MG capsule Take 600 mg by mouth at  bedtime.     [name] HYDROcodone-acetaminophen (NORCO/VICODIN) 5-325 MG tablet Take 1 tablet by mouth 2 (two) times daily as needed for pain.    [name] loratadine (CLARITIN) 10 MG tablet Take 10 mg by mouth daily.    [name] losartan (COZAAR) 50 MG tablet Take 50 mg by mouth daily.    [name] lovastatin (MEVACOR) 40 MG tablet Take 40 mg by mouth at bedtime.     . nitroGLYCERIN (NITROSTAT) 0.4 MG SL tablet Place 1 tablet (0.4 mg total) under the tongue every 5 (five) minutes as needed for chest pain. 25 tablet 3  . PROAIR HFA 108 (90 Base) MCG/ACT inhaler Inhale 1-2 puffs into the lungs every 4 (four) hours as needed for wheezing or shortness of breath.     . spironolactone (ALDACTONE) 50 MG tablet Take 50 mg by mouth daily.     . tamsulosin (FLOMAX) 0.4 MG CAPS capsule Take 0.8 mg by mouth daily after supper.     . TRADJENTA 5 MG TABS tablet Take 5 mg by mouth daily.  0  . TRULICITY 1.5 AS/5.0NL SOPN Inject 1.5 mg into the skin every Friday.     . umeclidinium-vilanterol (ANORO ELLIPTA) 62.5-25 MCG/INH AEPB Inhale 1 puff into the lungs daily.    . vitamin B-12 (CYANOCOBALAMIN) 500 MCG tablet Take 500 mcg by mouth  daily.       Results for orders placed or performed during the hospital encounter of 07/04/19 (from the past 48 hour(s))  SARS CORONAVIRUS 2 (TAT 6-24 HRS) Nasopharyngeal Nasopharyngeal Swab     Status: None   Collection Time: 07/04/19 12:27 PM   Specimen: Nasopharyngeal Swab  Result Value Ref Range   SARS Coronavirus 2 NEGATIVE NEGATIVE    Comment: (NOTE) SARS-CoV-2 target nucleic acids are NOT DETECTED. The SARS-CoV-2 RNA is generally detectable in upper and lower respiratory specimens during the acute phase of infection. Negative results do not preclude SARS-CoV-2 infection, do not rule out co-infections with other pathogens, and should not be used as the sole basis for treatment or other patient management decisions. Negative results must be combined with clinical observations, patient history,  and epidemiological information. The expected result is Negative. Fact Sheet for Patients: SugarRoll.be Fact Sheet for Healthcare Providers: https://www.woods-mathews.com/ This test is not yet approved or cleared by the [place] FDA and  has been authorized for detection and/or diagnosis of SARS-CoV-2 by FDA under an Emergency Use Authorization (EUA). This EUA will remain  in effect (meaning this test can be used) for the duration of the COVID-19 declaration under Section 56 4(b)(1) of the Act, 21 U.S.C. section 360bbb-3(b)(1), unless the authorization is terminated or revoked sooner. Performed at [facility], [place] [address] [place], [place] [address]    No results found.  Review of Systems  Constitutional: Negative.  Negative for fatigue.  HENT: Negative.   Eyes: Negative.   Endocrine: Negative.   Genitourinary: Negative.   Musculoskeletal: Positive for back pain. Negative for joint swelling, myalgias and neck stiffness.  Skin: Negative.   Allergic/Immunologic: Negative.   Neurological: Negative.   Hematological: Negative.   Psychiatric/Behavioral: Negative.     Blood pressure (!) 106/52, pulse 64, temperature 97.7 F (36.5 C), temperature source Oral, resp. rate 18, height 4\' 10"  (1.473 m), weight 88 kg, SpO2 97 %. Physical Exam  Constitutional: She is oriented to person, place, and time. She appears well-developed and well-nourished.  HENT:  Head: Normocephalic and atraumatic.  Eyes: Pupils are equal, round, and reactive to light. Conjunctivae are normal.  Cardiovascular: Normal rate.  Respiratory: Effort normal.  Musculoskeletal:        General: Normal range of motion.     Cervical back: Normal range of motion.  Neurological: She is alert and oriented to person, place, and time.  Skin: Skin is warm and dry.  Psychiatric: She has a normal mood and affect. Her behavior is normal. Thought content normal.      Assessment/Plan  chronic pain syndrome  Lumbar stenosis with neurogenic claudication Plan: Implantation of permanent spinal cord  Stimulator, [employer]  scientific  Bonna Gains, MD 07/07/2019, 7:31 AM

## 2019-07-07 ENCOUNTER — Ambulatory Visit (HOSPITAL_COMMUNITY)
Admission: RE | Admit: 2019-07-07 | Discharge: 2019-07-07 | Disposition: A | Discharge status: Home / Self Care | Payer: Medicare HMO | Attending: Anesthesiology | Admitting: Anesthesiology

## 2019-07-07 ENCOUNTER — Ambulatory Visit (HOSPITAL_COMMUNITY): Payer: Medicare HMO | Admitting: Anesthesiology

## 2019-07-07 ENCOUNTER — Ambulatory Visit (HOSPITAL_COMMUNITY): Payer: Medicare HMO | Admitting: Physician Assistant

## 2019-07-07 ENCOUNTER — Encounter (HOSPITAL_COMMUNITY)
Admission: RE | Disposition: A | Discharge status: Home / Self Care | Payer: Self-pay | Source: Home / Self Care | Attending: Anesthesiology

## 2019-07-07 ENCOUNTER — Encounter (HOSPITAL_COMMUNITY): Payer: Self-pay | Admitting: Anesthesiology

## 2019-07-07 ENCOUNTER — Ambulatory Visit (HOSPITAL_COMMUNITY): Payer: Medicare HMO

## 2019-07-07 ENCOUNTER — Other Ambulatory Visit: Payer: Self-pay

## 2019-07-07 DIAGNOSIS — Z7984 Long term (current) use of oral hypoglycemic drugs: Secondary | ICD-10-CM | POA: Diagnosis not present

## 2019-07-07 DIAGNOSIS — Z7982 Long term (current) use of aspirin: Secondary | ICD-10-CM | POA: Insufficient documentation

## 2019-07-07 DIAGNOSIS — M48062 Spinal stenosis, lumbar region with neurogenic claudication: Secondary | ICD-10-CM | POA: Diagnosis not present

## 2019-07-07 DIAGNOSIS — G894 Chronic pain syndrome: Secondary | ICD-10-CM | POA: Insufficient documentation

## 2019-07-07 DIAGNOSIS — I129 Hypertensive chronic kidney disease with stage 1 through stage 4 chronic kidney disease, or unspecified chronic kidney disease: Secondary | ICD-10-CM | POA: Diagnosis not present

## 2019-07-07 DIAGNOSIS — Z20822 Contact with and (suspected) exposure to covid-19: Secondary | ICD-10-CM | POA: Insufficient documentation

## 2019-07-07 DIAGNOSIS — Z79899 Other long term (current) drug therapy: Secondary | ICD-10-CM | POA: Insufficient documentation

## 2019-07-07 DIAGNOSIS — M961 Postlaminectomy syndrome, not elsewhere classified: Secondary | ICD-10-CM | POA: Insufficient documentation

## 2019-07-07 DIAGNOSIS — G4730 Sleep apnea, unspecified: Secondary | ICD-10-CM | POA: Diagnosis not present

## 2019-07-07 DIAGNOSIS — I10 Essential (primary) hypertension: Secondary | ICD-10-CM | POA: Diagnosis not present

## 2019-07-07 DIAGNOSIS — Z87891 Personal history of nicotine dependence: Secondary | ICD-10-CM | POA: Diagnosis not present

## 2019-07-07 DIAGNOSIS — N183 Chronic kidney disease, stage 3 unspecified: Secondary | ICD-10-CM | POA: Diagnosis not present

## 2019-07-07 DIAGNOSIS — Z85828 Personal history of other malignant neoplasm of skin: Secondary | ICD-10-CM | POA: Insufficient documentation

## 2019-07-07 DIAGNOSIS — Z462 Encounter for fitting and adjustment of other devices related to nervous system and special senses: Secondary | ICD-10-CM | POA: Diagnosis not present

## 2019-07-07 DIAGNOSIS — E1122 Type 2 diabetes mellitus with diabetic chronic kidney disease: Secondary | ICD-10-CM | POA: Diagnosis not present

## 2019-07-07 DIAGNOSIS — I4891 Unspecified atrial fibrillation: Secondary | ICD-10-CM | POA: Insufficient documentation

## 2019-07-07 DIAGNOSIS — E669 Obesity, unspecified: Secondary | ICD-10-CM | POA: Diagnosis not present

## 2019-07-07 DIAGNOSIS — Z6841 Body Mass Index (BMI) 40.0 and over, adult: Secondary | ICD-10-CM | POA: Diagnosis not present

## 2019-07-07 DIAGNOSIS — Z969 Presence of functional implant, unspecified: Secondary | ICD-10-CM | POA: Diagnosis not present

## 2019-07-07 DIAGNOSIS — Z7901 Long term (current) use of anticoagulants: Secondary | ICD-10-CM | POA: Diagnosis not present

## 2019-07-07 DIAGNOSIS — N189 Chronic kidney disease, unspecified: Secondary | ICD-10-CM | POA: Insufficient documentation

## 2019-07-07 DIAGNOSIS — Z419 Encounter for procedure for purposes other than remedying health state, unspecified: Secondary | ICD-10-CM

## 2019-07-07 DIAGNOSIS — Z8673 Personal history of transient ischemic attack (TIA), and cerebral infarction without residual deficits: Secondary | ICD-10-CM | POA: Diagnosis not present

## 2019-07-07 HISTORY — PX: SPINAL CORD STIMULATOR INSERTION: SHX5378

## 2019-07-07 LAB — PROTIME-INR
INR: 1 (ref 0.8–1.2)
Prothrombin Time: 13.4 seconds (ref 11.4–15.2)

## 2019-07-07 LAB — GLUCOSE, CAPILLARY
Glucose-Capillary: 163 mg/dL — ABNORMAL HIGH (ref 70–99)
Glucose-Capillary: 187 mg/dL — ABNORMAL HIGH (ref 70–99)

## 2019-07-07 SURGERY — INSERTION, SPINAL CORD STIMULATOR, LUMBAR
Anesthesia: Monitor Anesthesia Care

## 2019-07-07 MED ORDER — CHLORHEXIDINE GLUCONATE CLOTH 2 % EX PADS: 6.0000 | MEDICATED_PAD | Freq: Once | CUTANEOUS | Status: DC

## 2019-07-07 MED ORDER — ONDANSETRON HCL 4 MG/2ML IJ SOLN
INTRAMUSCULAR | Status: DC | PRN
  Administered 2019-07-07: 4 mg via INTRAVENOUS

## 2019-07-07 MED ORDER — FENTANYL CITRATE (PF) 100 MCG/2ML IJ SOLN: 25.0000 ug | INTRAMUSCULAR | Status: DC | PRN

## 2019-07-07 MED ORDER — PHENYLEPHRINE 40 MCG/ML (10ML) SYRINGE FOR IV PUSH (FOR BLOOD PRESSURE SUPPORT)
PREFILLED_SYRINGE | INTRAVENOUS | Status: AC
  Filled 2019-07-07: qty 10

## 2019-07-07 MED ORDER — PROPOFOL 10 MG/ML IV BOLUS
INTRAVENOUS | Status: AC
  Filled 2019-07-07: qty 20

## 2019-07-07 MED ORDER — SODIUM CHLORIDE 0.9 % IV SOLN: INTRAVENOUS | Status: DC | PRN

## 2019-07-07 MED ORDER — OXYCODONE HCL 5 MG/5ML PO SOLN: 5.0000 mg | Freq: Once | ORAL | Status: DC | PRN

## 2019-07-07 MED ORDER — LIDOCAINE-EPINEPHRINE 0.5 %-1:200000 IJ SOLN
INTRAMUSCULAR | Status: AC
  Filled 2019-07-07: qty 1

## 2019-07-07 MED ORDER — PROPOFOL 10 MG/ML IV BOLUS
INTRAVENOUS | Status: DC | PRN
  Administered 2019-07-07: 200 mg via INTRAVENOUS

## 2019-07-07 MED ORDER — GLYCOPYRROLATE PF 0.2 MG/ML IJ SOSY
PREFILLED_SYRINGE | INTRAMUSCULAR | Status: DC | PRN
  Administered 2019-07-07: .1 mg via INTRAVENOUS

## 2019-07-07 MED ORDER — ONDANSETRON HCL 4 MG/2ML IJ SOLN
INTRAMUSCULAR | Status: AC
  Filled 2019-07-07: qty 2

## 2019-07-07 MED ORDER — SUGAMMADEX SODIUM 200 MG/2ML IV SOLN
INTRAVENOUS | Status: DC | PRN
  Administered 2019-07-07: 200 mg via INTRAVENOUS

## 2019-07-07 MED ORDER — HYDROCODONE-ACETAMINOPHEN 5-325 MG PO TABS: 1.0000 | ORAL_TABLET | ORAL | 0 refills | Status: AC | PRN

## 2019-07-07 MED ORDER — 0.9 % SODIUM CHLORIDE (POUR BTL) OPTIME
TOPICAL | Status: DC | PRN
  Administered 2019-07-07: 1000 mL

## 2019-07-07 MED ORDER — SODIUM CHLORIDE 0.9 % IV SOLN
INTRAVENOUS | Status: DC | PRN
  Administered 2019-07-07: 500 mL

## 2019-07-07 MED ORDER — PHENYLEPHRINE HCL-NACL 10-0.9 MG/250ML-% IV SOLN
INTRAVENOUS | Status: DC | PRN
  Administered 2019-07-07: 60 ug/min via INTRAVENOUS

## 2019-07-07 MED ORDER — FENTANYL CITRATE (PF) 100 MCG/2ML IJ SOLN
INTRAMUSCULAR | Status: DC | PRN
  Administered 2019-07-07 (×3): 50 ug via INTRAVENOUS

## 2019-07-07 MED ORDER — PHENYLEPHRINE 40 MCG/ML (10ML) SYRINGE FOR IV PUSH (FOR BLOOD PRESSURE SUPPORT)
PREFILLED_SYRINGE | INTRAVENOUS | Status: DC | PRN
  Administered 2019-07-07 (×2): 80 ug via INTRAVENOUS
  Administered 2019-07-07: 40 ug via INTRAVENOUS
  Administered 2019-07-07: 80 ug via INTRAVENOUS

## 2019-07-07 MED ORDER — CLINDAMYCIN HCL 150 MG PO CAPS: 150.0000 mg | ORAL_CAPSULE | Freq: Three times a day (TID) | ORAL | 0 refills | Status: AC

## 2019-07-07 MED ORDER — DEXAMETHASONE SODIUM PHOSPHATE 10 MG/ML IJ SOLN
INTRAMUSCULAR | Status: DC | PRN
  Administered 2019-07-07: 4 mg via INTRAVENOUS

## 2019-07-07 MED ORDER — MIDAZOLAM HCL 5 MG/5ML IJ SOLN
INTRAMUSCULAR | Status: DC | PRN
  Administered 2019-07-07: 1 mg via INTRAVENOUS

## 2019-07-07 MED ORDER — GLYCOPYRROLATE PF 0.2 MG/ML IJ SOSY
PREFILLED_SYRINGE | INTRAMUSCULAR | Status: AC
  Filled 2019-07-07: qty 1

## 2019-07-07 MED ORDER — BUPIVACAINE HCL (PF) 0.5 % IJ SOLN
INTRAMUSCULAR | Status: AC
  Filled 2019-07-07: qty 30

## 2019-07-07 MED ORDER — DOUBLE ANTIBIOTIC 500-10000 UNIT/GM EX OINT
TOPICAL_OINTMENT | CUTANEOUS | Status: AC
  Filled 2019-07-07: qty 28.4

## 2019-07-07 MED ORDER — ALBUTEROL SULFATE HFA 108 (90 BASE) MCG/ACT IN AERS
INHALATION_SPRAY | RESPIRATORY_TRACT | Status: DC | PRN
  Administered 2019-07-07: 4 via RESPIRATORY_TRACT

## 2019-07-07 MED ORDER — FENTANYL CITRATE (PF) 250 MCG/5ML IJ SOLN
INTRAMUSCULAR | Status: AC
  Filled 2019-07-07: qty 5

## 2019-07-07 MED ORDER — MIDAZOLAM HCL 2 MG/2ML IJ SOLN
INTRAMUSCULAR | Status: AC
  Filled 2019-07-07: qty 2

## 2019-07-07 MED ORDER — ONDANSETRON HCL 4 MG/2ML IJ SOLN: 4.0000 mg | Freq: Once | INTRAMUSCULAR | Status: DC | PRN

## 2019-07-07 MED ORDER — LIDOCAINE-EPINEPHRINE 0.5 %-1:200000 IJ SOLN
INTRAMUSCULAR | Status: DC | PRN
  Administered 2019-07-07: 10 mL
  Administered 2019-07-07: 20 mL

## 2019-07-07 MED ORDER — ROCURONIUM BROMIDE 10 MG/ML (PF) SYRINGE
PREFILLED_SYRINGE | INTRAVENOUS | Status: AC
  Filled 2019-07-07: qty 10

## 2019-07-07 MED ORDER — ROCURONIUM BROMIDE 50 MG/5ML IV SOSY
PREFILLED_SYRINGE | INTRAVENOUS | Status: DC | PRN
  Administered 2019-07-07: 10 mg via INTRAVENOUS
  Administered 2019-07-07: 60 mg via INTRAVENOUS

## 2019-07-07 MED ORDER — ALBUTEROL SULFATE HFA 108 (90 BASE) MCG/ACT IN AERS
INHALATION_SPRAY | RESPIRATORY_TRACT | Status: AC
  Filled 2019-07-07: qty 6.7

## 2019-07-07 MED ORDER — LIDOCAINE 2% (20 MG/ML) 5 ML SYRINGE
INTRAMUSCULAR | Status: DC | PRN
  Administered 2019-07-07: 50 mg via INTRAVENOUS

## 2019-07-07 MED ORDER — OXYCODONE HCL 5 MG PO TABS: 5.0000 mg | ORAL_TABLET | Freq: Once | ORAL | Status: DC | PRN

## 2019-07-07 MED ORDER — DEXAMETHASONE SODIUM PHOSPHATE 10 MG/ML IJ SOLN
INTRAMUSCULAR | Status: AC
  Filled 2019-07-07: qty 1

## 2019-07-07 MED ORDER — LIDOCAINE 2% (20 MG/ML) 5 ML SYRINGE
INTRAMUSCULAR | Status: AC
  Filled 2019-07-07: qty 5

## 2019-07-07 MED ORDER — LACTATED RINGERS IV SOLN: INTRAVENOUS | Status: DC | PRN

## 2019-07-07 MED ORDER — CEFAZOLIN SODIUM-DEXTROSE 2-4 GM/100ML-% IV SOLN
2.0000 g | INTRAVENOUS | Status: AC
  Administered 2019-07-07: 2 g via INTRAVENOUS
  Filled 2019-07-07: qty 100

## 2019-07-07 SURGICAL SUPPLY — 64 items
ANCHOR CLIK X NEURO (Stimulator) ×2 IMPLANT
BAG DECANTER FOR FLEXI CONT (MISCELLANEOUS) ×2 IMPLANT
BENZOIN TINCTURE PRP APPL 2/3 (GAUZE/BANDAGES/DRESSINGS) IMPLANT
BINDER ABDOMINAL 12 ML 46-62 (SOFTGOODS) ×2 IMPLANT
BLADE CLIPPER SURG (BLADE) IMPLANT
CHLORAPREP W/TINT 26 (MISCELLANEOUS) ×2 IMPLANT
CLIP VESOCCLUDE SM WIDE 6/CT (CLIP) IMPLANT
CONTROL REMOTE FREELINK ALPHA (NEUROSURGERY SUPPLIES) ×2 IMPLANT
COVER WAND RF STERILE (DRAPES) ×2 IMPLANT
DERMABOND ADVANCED (GAUZE/BANDAGES/DRESSINGS) ×1
DERMABOND ADVANCED .7 DNX12 (GAUZE/BANDAGES/DRESSINGS) ×1 IMPLANT
DRAPE C-ARM 42X72 X-RAY (DRAPES) ×2 IMPLANT
DRAPE C-ARMOR (DRAPES) ×2 IMPLANT
DRAPE LAPAROTOMY 100X72X124 (DRAPES) ×2 IMPLANT
DRAPE SURG 17X23 STRL (DRAPES) ×2 IMPLANT
DRSG OPSITE POSTOP 3X4 (GAUZE/BANDAGES/DRESSINGS) IMPLANT
DRSG OPSITE POSTOP 4X6 (GAUZE/BANDAGES/DRESSINGS) ×4 IMPLANT
ELECT REM PT RETURN 9FT ADLT (ELECTROSURGICAL) ×2
ELECTRODE REM PT RTRN 9FT ADLT (ELECTROSURGICAL) ×1 IMPLANT
GAUZE 4X4 16PLY RFD (DISPOSABLE) ×2 IMPLANT
GENERATOR PULSE WAVEWRITER (Generator) ×2 IMPLANT
GLOVE BIO SURGEON STRL SZ 6.5 (GLOVE) ×4 IMPLANT
GLOVE BIOGEL PI IND STRL 7.5 (GLOVE) ×1 IMPLANT
GLOVE BIOGEL PI INDICATOR 7.5 (GLOVE) ×1
GLOVE ECLIPSE 7.5 STRL STRAW (GLOVE) ×2 IMPLANT
GLOVE EXAM NITRILE LRG STRL (GLOVE) IMPLANT
GLOVE EXAM NITRILE XL STR (GLOVE) IMPLANT
GLOVE EXAM NITRILE XS STR PU (GLOVE) IMPLANT
GLOVE SURG SS PI 6.5 STRL IVOR (GLOVE) ×4 IMPLANT
GOWN STRL REUS W/ TWL LRG LVL3 (GOWN DISPOSABLE) ×2 IMPLANT
GOWN STRL REUS W/ TWL XL LVL3 (GOWN DISPOSABLE) IMPLANT
GOWN STRL REUS W/TWL 2XL LVL3 (GOWN DISPOSABLE) IMPLANT
GOWN STRL REUS W/TWL LRG LVL3 (GOWN DISPOSABLE) ×4
GOWN STRL REUS W/TWL XL LVL3 (GOWN DISPOSABLE)
KIT BASIN OR (CUSTOM PROCEDURE TRAY) ×2 IMPLANT
KIT CHARGING (KITS) ×1
KIT CHARGING PRECISION NEURO (KITS) ×1 IMPLANT
KIT TURNOVER KIT B (KITS) ×2 IMPLANT
LEAD INFINION CX PERC 70CM (Lead) ×4 IMPLANT
NEEDLE 18GX1X1/2 (RX/OR ONLY) (NEEDLE) IMPLANT
NEEDLE ENTRADA 4.5IN (NEEDLE) ×4 IMPLANT
NEEDLE HYPO 25X1 1.5 SAFETY (NEEDLE) ×2 IMPLANT
NS IRRIG 1000ML POUR BTL (IV SOLUTION) ×2 IMPLANT
PACK LAMINECTOMY NEURO (CUSTOM PROCEDURE TRAY) ×2 IMPLANT
PAD ARMBOARD 7.5X6 YLW CONV (MISCELLANEOUS) IMPLANT
SPONGE LAP 4X18 RFD (DISPOSABLE) ×2 IMPLANT
SPONGE SURGIFOAM ABS GEL SZ50 (HEMOSTASIS) IMPLANT
STAPLER SKIN PROX WIDE 3.9 (STAPLE) IMPLANT
STRIP CLOSURE SKIN 1/2X4 (GAUZE/BANDAGES/DRESSINGS) IMPLANT
SUT MNCRL AB 4-0 PS2 18 (SUTURE) ×2 IMPLANT
SUT SILK 0 (SUTURE) ×2
SUT SILK 0 MO-6 18XCR BRD 8 (SUTURE) ×1 IMPLANT
SUT SILK 0 TIES 10X30 (SUTURE) IMPLANT
SUT SILK 2 0 TIES 10X30 (SUTURE) IMPLANT
SUT VIC AB 1 CT1 18XBRD ANBCTR (SUTURE) ×2 IMPLANT
SUT VIC AB 1 CT1 8-18 (SUTURE) ×4
SUT VIC AB 2-0 CP2 18 (SUTURE) ×4 IMPLANT
SYR 10ML LL (SYRINGE) IMPLANT
SYR EPIDURAL 5ML GLASS (SYRINGE) ×2 IMPLANT
TOOL LONG TUNNEL (SPINAL CORD STIMULATOR) ×2 IMPLANT
TOWEL GREEN STERILE (TOWEL DISPOSABLE) ×2 IMPLANT
TOWEL GREEN STERILE FF (TOWEL DISPOSABLE) ×2 IMPLANT
WATER STERILE IRR 1000ML POUR (IV SOLUTION) ×2 IMPLANT
YANKAUER SUCT BULB TIP NO VENT (SUCTIONS) ×2 IMPLANT

## 2019-07-07 NOTE — Anesthesia Procedure Notes (Signed)
Procedure Name: Intubation Date/Time: 07/07/2019 7:40 AM Performed by: Orlie Dakin, CRNA Pre-anesthesia Checklist: Patient identified, Emergency Drugs available, Suction available and Patient being monitored Patient Re-evaluated:Patient Re-evaluated prior to induction Oxygen Delivery Method: Circle system utilized Preoxygenation: Pre-oxygenation with 100% oxygen Ventilation: Oral airway inserted - appropriate to patient size Laryngoscope Size: Sabra Heck and 3 Grade View: Grade I Tube type: Oral Tube size: 7.0 mm Number of attempts: 1 Airway Equipment and Method: Stylet Placement Confirmation: ETT inserted through vocal cords under direct vision,  positive ETCO2 and breath sounds checked- equal and bilateral Secured at: 21 cm Tube secured with: Tape Dental Injury: Teeth and Oropharynx as per pre-operative assessment

## 2019-07-07 NOTE — Discharge Instructions (Addendum)
Dr. Maryjean Ka Post-Op Orders  . Ice Pack - 20 minutes on (in a pillow case), and 20 minutes off. Wear the ice pack UNDER the binder. . Follow up in office, they will call you for an appointment in 10 days to 2 weeks. . Increase activity gradually.   . No lifting anything heavier than a gallon of milk (10 pounds) until seen in the office. . Advance diet slowly as tolerated. . Dressing care:  Keep dressing dry for 3 days, and on Post-op day 4, may shower. . Call for fever, drainage, and redness. . No swimming or bathing in a bathtub (do not get into standing water). DO NOT RESTART ELIQUIS UNTIL Monday 07/10/2019

## 2019-07-07 NOTE — Transfer of Care (Signed)
Immediate Anesthesia Transfer of Care Note  Patient: Lisa Maxwell  Procedure(s) Performed: LUMBAR SPINAL CORD STIMULATOR INSERTION (N/A )  Patient Location: PACU  Anesthesia Type:General  Level of Consciousness: awake, oriented and patient cooperative  Airway & Oxygen Therapy: Patient Spontanous Breathing and Patient connected to face mask oxygen  Post-op Assessment: Report given to RN and Post -op Vital signs reviewed and stable  Post vital signs: Reviewed and stable  Last Vitals:  Vitals Value Taken Time  BP 130/58 07/07/19 0936  Temp    Pulse 65 07/07/19 0937  Resp 14 07/07/19 0937  SpO2 88 % 07/07/19 0937  Vitals shown include unvalidated device data.  Last Pain:  Vitals:   07/07/19 0606  TempSrc: Oral  PainSc:       Patients Stated Pain Goal: 2 (51/89/84 2103)  Complications: No apparent anesthesia complications

## 2019-07-07 NOTE — Op Note (Signed)
PREOP DX: 1) lumbar post-laminectomy syndrome; 2)chronic pain syndrome  POSTOP [id number] as preop PROCEDURES PERFORMED:1) intraop fluoro 2) placement of 2 16 contact [employer] Infinion leads 3) placement of SpectraWavewriterSCS generator 4) post op complex SCS programming SURGEON:Conswella Bruney  ASSISTANT: NONE  ANESTHESIA:GETA EBL: <20cc  DESCRIPTION OF PROCEDURE: After a discussion of risks, benefits and alternatives, informed consent was obtained. The patient was taken to the OR,general anesthesia induced,turned prone onto a Jackson table, all pressure points padded, SCD's placed. A timeout was taken to verify the correct patient, position, personnel, availability of appropriate equipment, and administration of perioperative antibiotics.  The thoracic and lumbar areas were widely prepped with chloraprep and draped into a sterile field. Fluoroscopy was used to plan aRIGHTparamedian incision at theL1-L3 levels, and an incision made with a 10 blade and carried down to the dorsolumbar fascia with the bovie and blunt dissection. Retractors were placed and a 14g [employer] tuohy needle placed into the epidural space at theT12-L1interspace using biplanar fluoro and loss-of-resistance technique. The needle was aspirated without any return of fluid. A [employer] INFINION lead was introduced and under live AP fluoro advanced until the distal-most4 contactsoverlay thesuperioraspectof theT7vertebral body shadow with the rest of the contacts distributed over [employer] in a position just at anatomic midline. A second Infinion lead was placed just left of anatomic midlineusing the same technique. Approximation was made to be similar to her trial lead positions, but with the left lead slightly more lateral. 0 silk sutures were placed in the fascia adjacent to the needles. The needles and stylets were removed under fluoroscopy with no lead migration noted. Leads  were then fixed to the fascia with cliK anchors. Repeat images were obtained to verify that there had been no lead migration. The incision was inspected and hemostasis obtained with the bipolar cautery.    Attention was then turned to creation of a subcutaneous pocket. At therightflank/buttock, a 3 cm incision was made with a 10 blade and using the bovie and blunt dissection a pocket of size appropriate to place a SCS generator. The pocket was trialed, and found to be of adequate size. The pocket was inspected for hemostasis, which was found to be excellent. Using reverse seldinger technique, the leads were tunneled to the pocket site, and the leads inserted into the SCS generator. Impedances were checked, and all found to be excellent. The leads were then all fixed into position with a self-torquing wrench. The wiring was all carefully coiled, placed behind the generator and placed in the pocket.  Both incisions were copiously irrigated with bacitracin-containing irrigation. The lumbar incision was closed in3deep layers of interrupted 1-0 vicryl and the skin closed a running 3-0 monocryl subcuticular suture and dermabond.The pocket incision was closed with a deeper layer of 2-0 vicryl interrupted sutures, and the skin closed a running 3-0 monocryl subcuticular suture and dermabond.Sterile dressings were applied.   Needle, sponge, and instrument counts were correct x2 at the end of the case.    The patient was then carefully awakened from anesthesia, turned supine, an abdominal binder placed, and the patient taken to the recovery room whereshe underwent complex spinal cord stimulator programming.  COMPLICATIONS: NONE  CONDITION: Stable throughout the course of the procedure and immediately afterward  DISPOSITION:anticipatedischarge to home, with antibiotics and pain medicine. Discussed care with the patientandfamily. Followup in clinic will be scheduled in 10-14 days.

## 2019-07-08 NOTE — Anesthesia Postprocedure Evaluation (Signed)
Anesthesia Post Note  Patient: Lisa Maxwell  Procedure(s) Performed: LUMBAR SPINAL CORD STIMULATOR INSERTION (N/A )     Patient location during evaluation: PACU Anesthesia Type: MAC Level of consciousness: awake and alert Pain management: pain level controlled Vital Signs Assessment: post-procedure vital signs reviewed and stable Respiratory status: spontaneous breathing, nonlabored ventilation, respiratory function stable and patient connected to nasal cannula oxygen Cardiovascular status: blood pressure returned to baseline and stable Postop Assessment: no apparent nausea or vomiting Anesthetic complications: no    Last Vitals:  Vitals:   07/07/19 1027 07/07/19 1030  BP:  123/67  Pulse: 61 65  Resp: 16 14  Temp:  (!) 36.3 C  SpO2: 95% 97%    Last Pain:  Vitals:   07/07/19 1030  TempSrc:   PainSc: 0-No pain                 Judianne Seiple COKER

## 2019-07-11 ENCOUNTER — Encounter: Payer: Self-pay | Admitting: *Deleted

## 2019-07-13 DIAGNOSIS — H04123 Dry eye syndrome of bilateral lacrimal glands: Secondary | ICD-10-CM | POA: Diagnosis not present

## 2019-07-13 DIAGNOSIS — H18513 Endothelial corneal dystrophy, bilateral: Secondary | ICD-10-CM | POA: Diagnosis not present

## 2019-07-13 DIAGNOSIS — Z9682 Presence of neurostimulator: Secondary | ICD-10-CM | POA: Diagnosis not present

## 2019-07-13 DIAGNOSIS — H1013 Acute atopic conjunctivitis, bilateral: Secondary | ICD-10-CM | POA: Diagnosis not present

## 2019-07-13 DIAGNOSIS — Z961 Presence of intraocular lens: Secondary | ICD-10-CM | POA: Diagnosis not present

## 2019-07-13 DIAGNOSIS — Z9689 Presence of other specified functional implants: Secondary | ICD-10-CM

## 2019-07-13 DIAGNOSIS — H40013 Open angle with borderline findings, low risk, bilateral: Secondary | ICD-10-CM | POA: Diagnosis not present

## 2019-07-13 DIAGNOSIS — Z760 Encounter for issue of repeat prescription: Secondary | ICD-10-CM | POA: Diagnosis not present

## 2019-07-13 DIAGNOSIS — G8929 Other chronic pain: Secondary | ICD-10-CM | POA: Diagnosis not present

## 2019-07-13 DIAGNOSIS — Z79891 Long term (current) use of opiate analgesic: Secondary | ICD-10-CM | POA: Diagnosis not present

## 2019-07-13 DIAGNOSIS — Z96653 Presence of artificial knee joint, bilateral: Secondary | ICD-10-CM | POA: Diagnosis not present

## 2019-07-13 DIAGNOSIS — M48062 Spinal stenosis, lumbar region with neurogenic claudication: Secondary | ICD-10-CM | POA: Diagnosis not present

## 2019-07-13 DIAGNOSIS — M47817 Spondylosis without myelopathy or radiculopathy, lumbosacral region: Secondary | ICD-10-CM | POA: Diagnosis not present

## 2019-07-13 DIAGNOSIS — M5136 Other intervertebral disc degeneration, lumbar region: Secondary | ICD-10-CM | POA: Diagnosis not present

## 2019-07-13 HISTORY — DX: Presence of other specified functional implants: Z96.89

## 2019-07-19 DIAGNOSIS — E1122 Type 2 diabetes mellitus with diabetic chronic kidney disease: Secondary | ICD-10-CM | POA: Diagnosis not present

## 2019-07-19 DIAGNOSIS — N184 Chronic kidney disease, stage 4 (severe): Secondary | ICD-10-CM | POA: Diagnosis not present

## 2019-07-19 DIAGNOSIS — E7800 Pure hypercholesterolemia, unspecified: Secondary | ICD-10-CM | POA: Diagnosis not present

## 2019-07-19 DIAGNOSIS — I4891 Unspecified atrial fibrillation: Secondary | ICD-10-CM | POA: Diagnosis not present

## 2019-07-19 DIAGNOSIS — M549 Dorsalgia, unspecified: Secondary | ICD-10-CM | POA: Diagnosis not present

## 2019-07-19 DIAGNOSIS — J449 Chronic obstructive pulmonary disease, unspecified: Secondary | ICD-10-CM | POA: Diagnosis not present

## 2019-07-19 DIAGNOSIS — N183 Chronic kidney disease, stage 3 unspecified: Secondary | ICD-10-CM | POA: Diagnosis not present

## 2019-07-19 DIAGNOSIS — G8929 Other chronic pain: Secondary | ICD-10-CM | POA: Diagnosis not present

## 2019-07-19 DIAGNOSIS — I1310 Hypertensive heart and chronic kidney disease without heart failure, with stage 1 through stage 4 chronic kidney disease, or unspecified chronic kidney disease: Secondary | ICD-10-CM | POA: Diagnosis not present

## 2019-07-31 DIAGNOSIS — Z1231 Encounter for screening mammogram for malignant neoplasm of breast: Secondary | ICD-10-CM | POA: Diagnosis not present

## 2019-07-31 DIAGNOSIS — Z6841 Body Mass Index (BMI) 40.0 and over, adult: Secondary | ICD-10-CM | POA: Diagnosis not present

## 2019-07-31 DIAGNOSIS — Z0000 Encounter for general adult medical examination without abnormal findings: Secondary | ICD-10-CM | POA: Diagnosis not present

## 2019-07-31 DIAGNOSIS — Z9181 History of falling: Secondary | ICD-10-CM | POA: Diagnosis not present

## 2019-07-31 DIAGNOSIS — E785 Hyperlipidemia, unspecified: Secondary | ICD-10-CM | POA: Diagnosis not present

## 2019-07-31 DIAGNOSIS — Z1331 Encounter for screening for depression: Secondary | ICD-10-CM | POA: Diagnosis not present

## 2019-08-17 DIAGNOSIS — M48062 Spinal stenosis, lumbar region with neurogenic claudication: Secondary | ICD-10-CM | POA: Diagnosis not present

## 2019-08-17 DIAGNOSIS — Z9689 Presence of other specified functional implants: Secondary | ICD-10-CM | POA: Diagnosis not present

## 2019-09-05 DIAGNOSIS — Z1231 Encounter for screening mammogram for malignant neoplasm of breast: Secondary | ICD-10-CM | POA: Diagnosis not present

## 2019-09-21 DIAGNOSIS — R928 Other abnormal and inconclusive findings on diagnostic imaging of breast: Secondary | ICD-10-CM | POA: Diagnosis not present

## 2019-09-21 DIAGNOSIS — N6041 Mammary duct ectasia of right breast: Secondary | ICD-10-CM | POA: Diagnosis not present

## 2019-09-21 DIAGNOSIS — R921 Mammographic calcification found on diagnostic imaging of breast: Secondary | ICD-10-CM | POA: Diagnosis not present

## 2019-09-24 ENCOUNTER — Other Ambulatory Visit: Payer: Self-pay | Admitting: Cardiology

## 2019-09-24 DIAGNOSIS — I2510 Atherosclerotic heart disease of native coronary artery without angina pectoris: Secondary | ICD-10-CM

## 2019-10-12 DIAGNOSIS — M48062 Spinal stenosis, lumbar region with neurogenic claudication: Secondary | ICD-10-CM | POA: Diagnosis not present

## 2019-10-12 DIAGNOSIS — M5136 Other intervertebral disc degeneration, lumbar region: Secondary | ICD-10-CM | POA: Diagnosis not present

## 2019-10-12 DIAGNOSIS — Z96653 Presence of artificial knee joint, bilateral: Secondary | ICD-10-CM | POA: Diagnosis not present

## 2019-10-12 DIAGNOSIS — Z9682 Presence of neurostimulator: Secondary | ICD-10-CM | POA: Diagnosis not present

## 2019-10-12 DIAGNOSIS — G8929 Other chronic pain: Secondary | ICD-10-CM | POA: Diagnosis not present

## 2019-10-12 DIAGNOSIS — Z79891 Long term (current) use of opiate analgesic: Secondary | ICD-10-CM | POA: Diagnosis not present

## 2019-10-12 DIAGNOSIS — Z9689 Presence of other specified functional implants: Secondary | ICD-10-CM | POA: Diagnosis not present

## 2019-10-12 DIAGNOSIS — M47814 Spondylosis without myelopathy or radiculopathy, thoracic region: Secondary | ICD-10-CM | POA: Diagnosis not present

## 2019-10-12 DIAGNOSIS — Z760 Encounter for issue of repeat prescription: Secondary | ICD-10-CM | POA: Diagnosis not present

## 2019-10-30 DIAGNOSIS — Z9689 Presence of other specified functional implants: Secondary | ICD-10-CM | POA: Diagnosis not present

## 2019-10-30 DIAGNOSIS — M48062 Spinal stenosis, lumbar region with neurogenic claudication: Secondary | ICD-10-CM | POA: Diagnosis not present

## 2019-11-09 DIAGNOSIS — Z79899 Other long term (current) drug therapy: Secondary | ICD-10-CM | POA: Diagnosis not present

## 2019-11-09 DIAGNOSIS — Z96653 Presence of artificial knee joint, bilateral: Secondary | ICD-10-CM | POA: Diagnosis not present

## 2019-11-09 DIAGNOSIS — Z760 Encounter for issue of repeat prescription: Secondary | ICD-10-CM | POA: Diagnosis not present

## 2019-11-09 DIAGNOSIS — M5116 Intervertebral disc disorders with radiculopathy, lumbar region: Secondary | ICD-10-CM | POA: Diagnosis not present

## 2019-11-09 DIAGNOSIS — Z9682 Presence of neurostimulator: Secondary | ICD-10-CM | POA: Diagnosis not present

## 2019-11-09 DIAGNOSIS — Z79891 Long term (current) use of opiate analgesic: Secondary | ICD-10-CM | POA: Diagnosis not present

## 2019-11-09 DIAGNOSIS — G8929 Other chronic pain: Secondary | ICD-10-CM | POA: Diagnosis not present

## 2019-11-09 DIAGNOSIS — M48062 Spinal stenosis, lumbar region with neurogenic claudication: Secondary | ICD-10-CM | POA: Diagnosis not present

## 2019-11-15 DIAGNOSIS — M48062 Spinal stenosis, lumbar region with neurogenic claudication: Secondary | ICD-10-CM | POA: Diagnosis not present

## 2019-11-15 DIAGNOSIS — R262 Difficulty in walking, not elsewhere classified: Secondary | ICD-10-CM | POA: Diagnosis not present

## 2019-11-15 DIAGNOSIS — R296 Repeated falls: Secondary | ICD-10-CM | POA: Diagnosis not present

## 2019-11-15 DIAGNOSIS — M545 Low back pain: Secondary | ICD-10-CM | POA: Diagnosis not present

## 2019-11-22 DIAGNOSIS — R262 Difficulty in walking, not elsewhere classified: Secondary | ICD-10-CM | POA: Diagnosis not present

## 2019-11-22 DIAGNOSIS — M545 Low back pain: Secondary | ICD-10-CM | POA: Diagnosis not present

## 2019-11-22 DIAGNOSIS — R296 Repeated falls: Secondary | ICD-10-CM | POA: Diagnosis not present

## 2019-11-22 DIAGNOSIS — M48062 Spinal stenosis, lumbar region with neurogenic claudication: Secondary | ICD-10-CM | POA: Diagnosis not present

## 2019-11-24 DIAGNOSIS — R296 Repeated falls: Secondary | ICD-10-CM | POA: Diagnosis not present

## 2019-11-24 DIAGNOSIS — R262 Difficulty in walking, not elsewhere classified: Secondary | ICD-10-CM | POA: Diagnosis not present

## 2019-11-24 DIAGNOSIS — M545 Low back pain: Secondary | ICD-10-CM | POA: Diagnosis not present

## 2019-11-24 DIAGNOSIS — M48062 Spinal stenosis, lumbar region with neurogenic claudication: Secondary | ICD-10-CM | POA: Diagnosis not present

## 2019-11-27 DIAGNOSIS — I1310 Hypertensive heart and chronic kidney disease without heart failure, with stage 1 through stage 4 chronic kidney disease, or unspecified chronic kidney disease: Secondary | ICD-10-CM | POA: Diagnosis not present

## 2019-11-27 DIAGNOSIS — N184 Chronic kidney disease, stage 4 (severe): Secondary | ICD-10-CM | POA: Diagnosis not present

## 2019-11-27 DIAGNOSIS — E1122 Type 2 diabetes mellitus with diabetic chronic kidney disease: Secondary | ICD-10-CM | POA: Diagnosis not present

## 2019-11-27 DIAGNOSIS — E7800 Pure hypercholesterolemia, unspecified: Secondary | ICD-10-CM | POA: Diagnosis not present

## 2019-11-27 DIAGNOSIS — G8929 Other chronic pain: Secondary | ICD-10-CM | POA: Diagnosis not present

## 2019-11-27 DIAGNOSIS — I4891 Unspecified atrial fibrillation: Secondary | ICD-10-CM | POA: Diagnosis not present

## 2019-11-27 DIAGNOSIS — M549 Dorsalgia, unspecified: Secondary | ICD-10-CM | POA: Diagnosis not present

## 2019-11-27 DIAGNOSIS — J449 Chronic obstructive pulmonary disease, unspecified: Secondary | ICD-10-CM | POA: Diagnosis not present

## 2019-11-27 DIAGNOSIS — N183 Chronic kidney disease, stage 3 unspecified: Secondary | ICD-10-CM | POA: Diagnosis not present

## 2019-11-28 DIAGNOSIS — M48062 Spinal stenosis, lumbar region with neurogenic claudication: Secondary | ICD-10-CM | POA: Diagnosis not present

## 2019-11-28 DIAGNOSIS — R262 Difficulty in walking, not elsewhere classified: Secondary | ICD-10-CM | POA: Diagnosis not present

## 2019-11-28 DIAGNOSIS — R296 Repeated falls: Secondary | ICD-10-CM | POA: Diagnosis not present

## 2019-11-28 DIAGNOSIS — M545 Low back pain: Secondary | ICD-10-CM | POA: Diagnosis not present

## 2019-11-29 DIAGNOSIS — R296 Repeated falls: Secondary | ICD-10-CM | POA: Diagnosis not present

## 2019-11-29 DIAGNOSIS — M545 Low back pain: Secondary | ICD-10-CM | POA: Diagnosis not present

## 2019-11-29 DIAGNOSIS — R262 Difficulty in walking, not elsewhere classified: Secondary | ICD-10-CM | POA: Diagnosis not present

## 2019-11-29 DIAGNOSIS — M48062 Spinal stenosis, lumbar region with neurogenic claudication: Secondary | ICD-10-CM | POA: Diagnosis not present

## 2019-12-04 DIAGNOSIS — M545 Low back pain: Secondary | ICD-10-CM | POA: Diagnosis not present

## 2019-12-04 DIAGNOSIS — R262 Difficulty in walking, not elsewhere classified: Secondary | ICD-10-CM | POA: Diagnosis not present

## 2019-12-04 DIAGNOSIS — M48062 Spinal stenosis, lumbar region with neurogenic claudication: Secondary | ICD-10-CM | POA: Diagnosis not present

## 2019-12-04 DIAGNOSIS — R296 Repeated falls: Secondary | ICD-10-CM | POA: Diagnosis not present

## 2019-12-06 DIAGNOSIS — R296 Repeated falls: Secondary | ICD-10-CM | POA: Diagnosis not present

## 2019-12-06 DIAGNOSIS — R262 Difficulty in walking, not elsewhere classified: Secondary | ICD-10-CM | POA: Diagnosis not present

## 2019-12-06 DIAGNOSIS — M48062 Spinal stenosis, lumbar region with neurogenic claudication: Secondary | ICD-10-CM | POA: Diagnosis not present

## 2019-12-06 DIAGNOSIS — M545 Low back pain: Secondary | ICD-10-CM | POA: Diagnosis not present

## 2019-12-12 DIAGNOSIS — M545 Low back pain: Secondary | ICD-10-CM | POA: Diagnosis not present

## 2019-12-12 DIAGNOSIS — R296 Repeated falls: Secondary | ICD-10-CM | POA: Diagnosis not present

## 2019-12-12 DIAGNOSIS — M48062 Spinal stenosis, lumbar region with neurogenic claudication: Secondary | ICD-10-CM | POA: Diagnosis not present

## 2019-12-12 DIAGNOSIS — R262 Difficulty in walking, not elsewhere classified: Secondary | ICD-10-CM | POA: Diagnosis not present

## 2019-12-13 DIAGNOSIS — M545 Low back pain: Secondary | ICD-10-CM | POA: Diagnosis not present

## 2019-12-13 DIAGNOSIS — R262 Difficulty in walking, not elsewhere classified: Secondary | ICD-10-CM | POA: Diagnosis not present

## 2019-12-13 DIAGNOSIS — R296 Repeated falls: Secondary | ICD-10-CM | POA: Diagnosis not present

## 2019-12-13 DIAGNOSIS — M48062 Spinal stenosis, lumbar region with neurogenic claudication: Secondary | ICD-10-CM | POA: Diagnosis not present

## 2019-12-18 DIAGNOSIS — M48062 Spinal stenosis, lumbar region with neurogenic claudication: Secondary | ICD-10-CM | POA: Diagnosis not present

## 2019-12-18 DIAGNOSIS — M545 Low back pain: Secondary | ICD-10-CM | POA: Diagnosis not present

## 2019-12-18 DIAGNOSIS — R262 Difficulty in walking, not elsewhere classified: Secondary | ICD-10-CM | POA: Diagnosis not present

## 2019-12-18 DIAGNOSIS — R296 Repeated falls: Secondary | ICD-10-CM | POA: Diagnosis not present

## 2019-12-20 DIAGNOSIS — M48062 Spinal stenosis, lumbar region with neurogenic claudication: Secondary | ICD-10-CM | POA: Diagnosis not present

## 2019-12-20 DIAGNOSIS — R296 Repeated falls: Secondary | ICD-10-CM | POA: Diagnosis not present

## 2019-12-20 DIAGNOSIS — M545 Low back pain: Secondary | ICD-10-CM | POA: Diagnosis not present

## 2019-12-20 DIAGNOSIS — R262 Difficulty in walking, not elsewhere classified: Secondary | ICD-10-CM | POA: Diagnosis not present

## 2019-12-25 DIAGNOSIS — M545 Low back pain: Secondary | ICD-10-CM | POA: Diagnosis not present

## 2019-12-25 DIAGNOSIS — R296 Repeated falls: Secondary | ICD-10-CM | POA: Diagnosis not present

## 2019-12-25 DIAGNOSIS — M48062 Spinal stenosis, lumbar region with neurogenic claudication: Secondary | ICD-10-CM | POA: Diagnosis not present

## 2019-12-25 DIAGNOSIS — R262 Difficulty in walking, not elsewhere classified: Secondary | ICD-10-CM | POA: Diagnosis not present

## 2019-12-27 DIAGNOSIS — M48062 Spinal stenosis, lumbar region with neurogenic claudication: Secondary | ICD-10-CM | POA: Diagnosis not present

## 2019-12-27 DIAGNOSIS — M545 Low back pain: Secondary | ICD-10-CM | POA: Diagnosis not present

## 2019-12-27 DIAGNOSIS — R262 Difficulty in walking, not elsewhere classified: Secondary | ICD-10-CM | POA: Diagnosis not present

## 2019-12-27 DIAGNOSIS — R296 Repeated falls: Secondary | ICD-10-CM | POA: Diagnosis not present

## 2020-01-03 DIAGNOSIS — N390 Urinary tract infection, site not specified: Secondary | ICD-10-CM | POA: Diagnosis not present

## 2020-01-03 DIAGNOSIS — R339 Retention of urine, unspecified: Secondary | ICD-10-CM | POA: Diagnosis not present

## 2020-01-24 DIAGNOSIS — N189 Chronic kidney disease, unspecified: Secondary | ICD-10-CM | POA: Diagnosis not present

## 2020-01-24 DIAGNOSIS — N183 Chronic kidney disease, stage 3 unspecified: Secondary | ICD-10-CM | POA: Diagnosis not present

## 2020-01-29 DIAGNOSIS — I129 Hypertensive chronic kidney disease with stage 1 through stage 4 chronic kidney disease, or unspecified chronic kidney disease: Secondary | ICD-10-CM | POA: Diagnosis not present

## 2020-01-29 DIAGNOSIS — N2581 Secondary hyperparathyroidism of renal origin: Secondary | ICD-10-CM | POA: Diagnosis not present

## 2020-01-29 DIAGNOSIS — D631 Anemia in chronic kidney disease: Secondary | ICD-10-CM | POA: Diagnosis not present

## 2020-01-29 DIAGNOSIS — E1122 Type 2 diabetes mellitus with diabetic chronic kidney disease: Secondary | ICD-10-CM | POA: Diagnosis not present

## 2020-01-29 DIAGNOSIS — N1832 Chronic kidney disease, stage 3b: Secondary | ICD-10-CM | POA: Diagnosis not present

## 2020-01-30 DIAGNOSIS — M48062 Spinal stenosis, lumbar region with neurogenic claudication: Secondary | ICD-10-CM | POA: Diagnosis not present

## 2020-01-30 DIAGNOSIS — Z9689 Presence of other specified functional implants: Secondary | ICD-10-CM | POA: Diagnosis not present

## 2020-02-06 DIAGNOSIS — Z760 Encounter for issue of repeat prescription: Secondary | ICD-10-CM | POA: Diagnosis not present

## 2020-02-06 DIAGNOSIS — M48062 Spinal stenosis, lumbar region with neurogenic claudication: Secondary | ICD-10-CM | POA: Diagnosis not present

## 2020-02-06 DIAGNOSIS — M47817 Spondylosis without myelopathy or radiculopathy, lumbosacral region: Secondary | ICD-10-CM | POA: Diagnosis not present

## 2020-02-06 DIAGNOSIS — Z79891 Long term (current) use of opiate analgesic: Secondary | ICD-10-CM | POA: Diagnosis not present

## 2020-02-06 DIAGNOSIS — Z79899 Other long term (current) drug therapy: Secondary | ICD-10-CM | POA: Diagnosis not present

## 2020-02-06 DIAGNOSIS — Z9682 Presence of neurostimulator: Secondary | ICD-10-CM | POA: Diagnosis not present

## 2020-02-06 DIAGNOSIS — M5136 Other intervertebral disc degeneration, lumbar region: Secondary | ICD-10-CM | POA: Diagnosis not present

## 2020-02-06 DIAGNOSIS — G8929 Other chronic pain: Secondary | ICD-10-CM | POA: Diagnosis not present

## 2020-02-16 DIAGNOSIS — R339 Retention of urine, unspecified: Secondary | ICD-10-CM | POA: Diagnosis not present

## 2020-02-16 DIAGNOSIS — N390 Urinary tract infection, site not specified: Secondary | ICD-10-CM | POA: Diagnosis not present

## 2020-02-19 DIAGNOSIS — N390 Urinary tract infection, site not specified: Secondary | ICD-10-CM | POA: Diagnosis not present

## 2020-02-19 DIAGNOSIS — R339 Retention of urine, unspecified: Secondary | ICD-10-CM | POA: Diagnosis not present

## 2020-02-27 DIAGNOSIS — N2581 Secondary hyperparathyroidism of renal origin: Secondary | ICD-10-CM | POA: Diagnosis not present

## 2020-03-24 ENCOUNTER — Other Ambulatory Visit: Payer: Self-pay | Admitting: Cardiology

## 2020-03-24 DIAGNOSIS — I2510 Atherosclerotic heart disease of native coronary artery without angina pectoris: Secondary | ICD-10-CM

## 2020-03-26 DIAGNOSIS — N183 Chronic kidney disease, stage 3 unspecified: Secondary | ICD-10-CM | POA: Diagnosis not present

## 2020-03-26 DIAGNOSIS — M549 Dorsalgia, unspecified: Secondary | ICD-10-CM | POA: Diagnosis not present

## 2020-03-26 DIAGNOSIS — G8929 Other chronic pain: Secondary | ICD-10-CM | POA: Diagnosis not present

## 2020-03-26 DIAGNOSIS — N184 Chronic kidney disease, stage 4 (severe): Secondary | ICD-10-CM | POA: Diagnosis not present

## 2020-03-26 DIAGNOSIS — E7800 Pure hypercholesterolemia, unspecified: Secondary | ICD-10-CM | POA: Diagnosis not present

## 2020-03-26 DIAGNOSIS — E1122 Type 2 diabetes mellitus with diabetic chronic kidney disease: Secondary | ICD-10-CM | POA: Diagnosis not present

## 2020-03-26 DIAGNOSIS — I4891 Unspecified atrial fibrillation: Secondary | ICD-10-CM | POA: Diagnosis not present

## 2020-03-26 DIAGNOSIS — J449 Chronic obstructive pulmonary disease, unspecified: Secondary | ICD-10-CM | POA: Diagnosis not present

## 2020-03-26 DIAGNOSIS — I1310 Hypertensive heart and chronic kidney disease without heart failure, with stage 1 through stage 4 chronic kidney disease, or unspecified chronic kidney disease: Secondary | ICD-10-CM | POA: Diagnosis not present

## 2020-04-03 DIAGNOSIS — R339 Retention of urine, unspecified: Secondary | ICD-10-CM | POA: Diagnosis not present

## 2020-04-03 DIAGNOSIS — N390 Urinary tract infection, site not specified: Secondary | ICD-10-CM | POA: Diagnosis not present

## 2020-04-11 DIAGNOSIS — N390 Urinary tract infection, site not specified: Secondary | ICD-10-CM | POA: Diagnosis not present

## 2020-04-12 ENCOUNTER — Other Ambulatory Visit: Payer: Self-pay

## 2020-04-12 DIAGNOSIS — R011 Cardiac murmur, unspecified: Secondary | ICD-10-CM | POA: Insufficient documentation

## 2020-04-12 DIAGNOSIS — I499 Cardiac arrhythmia, unspecified: Secondary | ICD-10-CM | POA: Insufficient documentation

## 2020-04-12 DIAGNOSIS — G4730 Sleep apnea, unspecified: Secondary | ICD-10-CM | POA: Insufficient documentation

## 2020-04-12 DIAGNOSIS — C801 Malignant (primary) neoplasm, unspecified: Secondary | ICD-10-CM | POA: Insufficient documentation

## 2020-04-12 DIAGNOSIS — I679 Cerebrovascular disease, unspecified: Secondary | ICD-10-CM | POA: Insufficient documentation

## 2020-04-12 DIAGNOSIS — M1712 Unilateral primary osteoarthritis, left knee: Secondary | ICD-10-CM | POA: Insufficient documentation

## 2020-04-12 DIAGNOSIS — N189 Chronic kidney disease, unspecified: Secondary | ICD-10-CM | POA: Insufficient documentation

## 2020-04-12 DIAGNOSIS — G4733 Obstructive sleep apnea (adult) (pediatric): Secondary | ICD-10-CM | POA: Insufficient documentation

## 2020-04-15 ENCOUNTER — Other Ambulatory Visit: Payer: Self-pay

## 2020-04-15 ENCOUNTER — Ambulatory Visit: Payer: Medicare HMO | Admitting: Cardiology

## 2020-04-15 ENCOUNTER — Telehealth: Payer: Self-pay

## 2020-04-15 ENCOUNTER — Encounter: Payer: Self-pay | Admitting: Cardiology

## 2020-04-15 VITALS — BP 126/60 | HR 83 | Ht <= 58 in | Wt 179.8 lb

## 2020-04-15 DIAGNOSIS — E782 Mixed hyperlipidemia: Secondary | ICD-10-CM | POA: Diagnosis not present

## 2020-04-15 DIAGNOSIS — G4730 Sleep apnea, unspecified: Secondary | ICD-10-CM

## 2020-04-15 DIAGNOSIS — N289 Disorder of kidney and ureter, unspecified: Secondary | ICD-10-CM | POA: Diagnosis not present

## 2020-04-15 DIAGNOSIS — I10 Essential (primary) hypertension: Secondary | ICD-10-CM

## 2020-04-15 DIAGNOSIS — I2510 Atherosclerotic heart disease of native coronary artery without angina pectoris: Secondary | ICD-10-CM | POA: Diagnosis not present

## 2020-04-15 DIAGNOSIS — I480 Paroxysmal atrial fibrillation: Secondary | ICD-10-CM

## 2020-04-15 NOTE — Telephone Encounter (Signed)
4 boxes of Eliquis given per Dr. Julien Nordmann order. Lot # [id number] Exp: 07/2021

## 2020-04-15 NOTE — Progress Notes (Signed)
Cardiology Office Note:    Date:  04/15/2020   ID:  Lisa Maxwell, Lisa Maxwell 11-13-1945, MRN [id number]  PCP:  Nicoletta Dress, MD  Cardiologist:  Jenean Lindau, MD   Referring MD: Nicoletta Dress, MD    ASSESSMENT:    1. Coronary artery disease involving native coronary artery of native heart without angina pectoris   2. Essential hypertension   3. PAF (paroxysmal atrial fibrillation) ([place])   4. Mixed dyslipidemia   5. Morbid obesity ([place])   6. Renal insufficiency   7. Sleep apnea, unspecified type    PLAN:    In order of problems listed above:  1. Coronary artery disease: Secondary prevention stressed with the patient.  Importance of compliance with diet medication stressed and she vocalized understanding.  She is asymptomatic at this time.  She leads a sedentary lifestyle and importance of exercise stressed. 2. Essential hypertension: Blood pressure stable and diet was emphasized. 3. Paroxysmal atrial fibrillation: EKG done today reveals sinus rhythm and nonspecific ST-T changes.  I discussed with the patient atrial fibrillation, disease process. Management and therapy including rate and rhythm control, anticoagulation benefits and potential risks were discussed extensively with the patient. Patient had multiple questions which were answered to patient's satisfaction. 4. Mixed dyslipidemia and diabetes mellitus: Diet was emphasized.  Lipids were reviewed and they are fine.  Hemoglobin A1c was elevated and the risks explained in importance of compliance with diet and medication stressed.  She has significant obesity and this of obesity and weight reduction was stressed. 5. Sleep apnea: Sleep health issues were discussed. 6. Renal insufficiency: Stable and managed by primary care.  Renal function and creatinine reviewed with her.Patient will be seen in follow-up appointment in 6 months or earlier if the patient has any concerns    Medication Adjustments/Labs and Tests  Ordered: Current medicines are reviewed at length with the patient today.  Concerns regarding medicines are outlined above.  No orders of the defined types were placed in this encounter.  No orders of the defined types were placed in this encounter.    No chief complaint on file.    History of Present Illness:    Lisa Maxwell is a 75 y.o. female.  Patient has past medical history of coronary artery disease, essential hypertension, diabetes mellitus, dyslipidemia renal insufficiency, paroxysmal atrial fibrillation and obesity.  She is accompanied by her husband.  The close to her son on New Year's Eve.  Patient denies any chest pain orthopnea or PND.  At the time of my evaluation, the patient is alert awake oriented and in no distress.  She recently had blood work done by primary care and I reviewed it.  Past Medical History:  Diagnosis Date  . CAD (coronary artery disease) 11/04/2016  . Cancer ([place])    skin cancer  . Cerebrovascular disease   . Chronic atrial fibrillation ([place]) 09/28/2018  . Chronic bilateral low back pain without sciatica 03/08/2018  . Chronic kidney disease   . Chronic obstructive lung disease ([place]) 09/28/2018  . Chronic pain syndrome 03/08/2018  . Chronic renal failure 09/28/2018  . CKD (chronic kidney disease) stage 3, GFR 30-59 ml/min (HCC)   . DDD (degenerative disc disease), lumbar 11/17/2018   Last Assessment & Plan:  Formatting of this note might be different from the original. See spinal stenosis plan  . Diabetes ([place])   . Diabetes mellitus due to underlying condition with unspecified complications ([place]) 05/10/4008  . Dysrhythmia    afib  .  Essential hypertension 11/04/2016  . Facet arthritis of lumbar region 03/08/2018  . Facet joint disease 11/17/2018   Last Assessment & Plan:  Formatting of this note might be different from the original. See spinal stenosis plan  . Foraminal stenosis of lumbar region 03/08/2018  . Heart murmur   . History of knee replacement,  total, bilateral 11/17/2018   Last Assessment & Plan:  Formatting of this note might be different from the original. Have recommended to her several times if any further concerns in regards to the pain status post arthroplasty, she is to follow up with her surgeon.  . Hypertension   . Hypertensive disorder 09/28/2018  . Increased body mass index 09/28/2018  . Lumbar radiculopathy 03/08/2018  . Mixed dyslipidemia 11/04/2016  . Morbid obesity ([place]) 11/04/2016  . Osteoarthritis of left knee   . PAF (paroxysmal atrial fibrillation) ([place]) 04/30/2015  . Pain management contract agreement 04/13/2019   Last Assessment & Plan:  Formatting of this note might be different from the original. Contract updated today.  UDS completed.  [place] controlled substance registry reviewed and is consistent with her regimen.  . Peripheral vascular disease ([place]) 09/28/2018  . Preop cardiovascular exam 05/01/2019  . Renal insufficiency 04/30/2015  . S/P insertion of spinal cord stimulator 07/13/2019   Last Assessment & Plan:  Formatting of this note might be different from the original. About 5 months status post insertion of spinal cord stimulator with excellent relief.  She continues to keep close follow-up with her representative and has been working to appropriately find best programs to get her adequate pain relief.  . Sleep apnea   . Smoker 09/28/2018  . Spinal stenosis of lumbar region with neurogenic claudication 03/08/2018   Last Assessment & Plan:  Formatting of this note is different from the original. 42 year oldfemale with chronic low back pain and bilateral, right greater than left, L4-5 radicular pain, and claudication. She has substantial amount of multifactorial degenerative thoracic and lumbar spine pathology. After being deemed an inappropriate open surgical candidate, and failing to respond to multiple in  . Spondylosis of lumbosacral region without myelopathy or radiculopathy 01/30/2019   Last Assessment &  Plan:  Formatting of this note might be different from the original. See spinal stenosis plan  . Type 2 diabetes mellitus ([place]) 09/28/2018    Past Surgical History:  Procedure Laterality Date  . ABDOMINAL HYSTERECTOMY    . JOINT REPLACEMENT Bilateral    knees  . SPINAL CORD STIMULATOR INSERTION N/A 07/07/2019   Procedure: LUMBAR SPINAL CORD STIMULATOR INSERTION;  Surgeon: Clydell Hakim, MD;  Location: [facility];  Service: Neurosurgery;  Laterality: N/A;  . total left knee      Current Medications: Current Meds  Medication Sig  . apixaban (ELIQUIS) 5 MG TABS tablet Take 1 tablet (5 mg total) by mouth 2 (two) times daily.  [name] aspirin 81 MG tablet Take 81 mg by mouth daily.  . carvedilol (COREG) 12.5 MG tablet Take 12.5 mg by mouth 2 (two) times daily.  . Dulaglutide (TRULICITY) 1.75 ZW/2.5EN SOPN Inject 0.75 mg into the skin once a week.  . fenofibrate 160 MG tablet Take 160 mg by mouth daily in the afternoon.  . furosemide (LASIX) 40 MG tablet Take 40 mg by mouth daily.   [name] gabapentin (NEURONTIN) 100 MG capsule Take 100 mg by mouth 2 (two) times daily.  [name] loratadine (CLARITIN) 10 MG tablet Take 10 mg by mouth daily.  [name] losartan (COZAAR) 50 MG tablet Take  50 mg by mouth daily.  [name] lovastatin (MEVACOR) 40 MG tablet Take 40 mg by mouth at bedtime.   [name] PROAIR HFA 108 (90 Base) MCG/ACT inhaler Inhale 1-2 puffs into the lungs every 4 (four) hours as needed for wheezing or shortness of breath.   . spironolactone (ALDACTONE) 50 MG tablet Take 50 mg by mouth daily.   . tamsulosin (FLOMAX) 0.4 MG CAPS capsule Take 0.8 mg by mouth daily after supper.   . umeclidinium-vilanterol (ANORO ELLIPTA) 62.5-25 MCG/INH AEPB Inhale 1 puff into the lungs daily.  [name] VASCEPA 1 g capsule Take 1 g by mouth 2 (two) times daily.  . vitamin B-12 (CYANOCOBALAMIN) 500 MCG tablet Take 500 mcg by mouth daily.      Allergies:   Patient has no known allergies.   Social History   Socioeconomic History  . Marital status:  Married    Spouse name: Not on file  . Number of children: Not on file  . Years of education: Not on file  . Highest education level: Not on file  Occupational History  . Not on file  Tobacco Use  . Smoking status: Former Smoker    Quit date: 05/08/2019    Years since quitting: 0.9  . Smokeless tobacco: Never Used  Vaping Use  . Vaping Use: Never used  Substance and Sexual Activity  . Alcohol use: No  . Drug use: No  . Sexual activity: Not on file  Other Topics Concern  . Not on file  Social History Narrative  . Not on file   Social Determinants of Health   Financial Resource Strain: Not on file  Food Insecurity: Not on file  Transportation Needs: Not on file  Physical Activity: Not on file  Stress: Not on file  Social Connections: Not on file     Family History: The patient's family history includes Diabetes in her mother.  ROS:   Please see the history of present illness.    All other systems reviewed and are negative.  EKGs/Labs/Other Studies Reviewed:    The following studies were reviewed today: I discussed my findings with the patient at length including lab work from [place]: 07/04/2019: BUN 26; Creatinine, Ser 1.67; Hemoglobin 11.1; Platelets 236; Potassium 4.9; Sodium 137  Recent Lipid Panel No results found for: CHOL, TRIG, HDL, CHOLHDL, VLDL, LDLCALC, LDLDIRECT  Physical Exam:    VS:  BP 126/60   Pulse 83   Ht 4\' 10"  (1.473 m)   Wt 179 lb 12.8 oz (81.6 kg)   SpO2 96%   BMI 37.58 kg/m     Wt Readings from Last 3 Encounters:  04/15/20 179 lb 12.8 oz (81.6 kg)  07/07/19 194 lb 0.1 oz (88 kg)  07/04/19 193 lb 4.8 oz (87.7 kg)     GEN: Patient is in no acute distress HEENT: Normal NECK: No JVD; No carotid bruits LYMPHATICS: No lymphadenopathy CARDIAC: Hear sounds regular, 2/6 systolic murmur at the apex. RESPIRATORY:  Clear to auscultation without rales, wheezing or rhonchi  ABDOMEN: Soft, non-tender,  non-distended MUSCULOSKELETAL:  No edema; No deformity  SKIN: Warm and dry NEUROLOGIC:  Alert and oriented x 3 PSYCHIATRIC:  Normal affect   Signed, Jenean Lindau, MD  04/15/2020 8:49 AM    [place]

## 2020-04-15 NOTE — Patient Instructions (Signed)

## 2020-04-29 DIAGNOSIS — Z9689 Presence of other specified functional implants: Secondary | ICD-10-CM | POA: Diagnosis not present

## 2020-04-29 DIAGNOSIS — M791 Myalgia, unspecified site: Secondary | ICD-10-CM

## 2020-04-29 DIAGNOSIS — M48062 Spinal stenosis, lumbar region with neurogenic claudication: Secondary | ICD-10-CM | POA: Diagnosis not present

## 2020-04-29 HISTORY — DX: Myalgia, unspecified site: M79.10

## 2020-05-14 DIAGNOSIS — Z9682 Presence of neurostimulator: Secondary | ICD-10-CM | POA: Diagnosis not present

## 2020-05-14 DIAGNOSIS — Z79891 Long term (current) use of opiate analgesic: Secondary | ICD-10-CM | POA: Diagnosis not present

## 2020-05-14 DIAGNOSIS — M5136 Other intervertebral disc degeneration, lumbar region: Secondary | ICD-10-CM | POA: Diagnosis not present

## 2020-05-14 DIAGNOSIS — Z79899 Other long term (current) drug therapy: Secondary | ICD-10-CM | POA: Diagnosis not present

## 2020-05-14 DIAGNOSIS — M48062 Spinal stenosis, lumbar region with neurogenic claudication: Secondary | ICD-10-CM | POA: Diagnosis not present

## 2020-05-14 DIAGNOSIS — Z760 Encounter for issue of repeat prescription: Secondary | ICD-10-CM | POA: Diagnosis not present

## 2020-05-14 DIAGNOSIS — G8929 Other chronic pain: Secondary | ICD-10-CM | POA: Diagnosis not present

## 2020-05-27 DIAGNOSIS — H40013 Open angle with borderline findings, low risk, bilateral: Secondary | ICD-10-CM | POA: Diagnosis not present

## 2020-05-27 DIAGNOSIS — H18513 Endothelial corneal dystrophy, bilateral: Secondary | ICD-10-CM | POA: Diagnosis not present

## 2020-05-27 DIAGNOSIS — H04123 Dry eye syndrome of bilateral lacrimal glands: Secondary | ICD-10-CM | POA: Diagnosis not present

## 2020-05-27 DIAGNOSIS — Z961 Presence of intraocular lens: Secondary | ICD-10-CM | POA: Diagnosis not present

## 2020-05-27 DIAGNOSIS — H1013 Acute atopic conjunctivitis, bilateral: Secondary | ICD-10-CM | POA: Diagnosis not present

## 2020-05-27 DIAGNOSIS — E119 Type 2 diabetes mellitus without complications: Secondary | ICD-10-CM | POA: Diagnosis not present

## 2020-07-04 DIAGNOSIS — M791 Myalgia, unspecified site: Secondary | ICD-10-CM | POA: Diagnosis not present

## 2020-07-04 DIAGNOSIS — Z9689 Presence of other specified functional implants: Secondary | ICD-10-CM | POA: Diagnosis not present

## 2020-07-04 DIAGNOSIS — M48062 Spinal stenosis, lumbar region with neurogenic claudication: Secondary | ICD-10-CM | POA: Diagnosis not present

## 2020-07-10 DIAGNOSIS — N390 Urinary tract infection, site not specified: Secondary | ICD-10-CM | POA: Diagnosis not present

## 2020-07-10 DIAGNOSIS — R339 Retention of urine, unspecified: Secondary | ICD-10-CM | POA: Diagnosis not present

## 2020-07-26 DIAGNOSIS — J449 Chronic obstructive pulmonary disease, unspecified: Secondary | ICD-10-CM | POA: Diagnosis not present

## 2020-07-26 DIAGNOSIS — N184 Chronic kidney disease, stage 4 (severe): Secondary | ICD-10-CM | POA: Diagnosis not present

## 2020-07-26 DIAGNOSIS — E1122 Type 2 diabetes mellitus with diabetic chronic kidney disease: Secondary | ICD-10-CM | POA: Diagnosis not present

## 2020-07-26 DIAGNOSIS — Z139 Encounter for screening, unspecified: Secondary | ICD-10-CM | POA: Diagnosis not present

## 2020-07-26 DIAGNOSIS — E7800 Pure hypercholesterolemia, unspecified: Secondary | ICD-10-CM | POA: Diagnosis not present

## 2020-07-26 DIAGNOSIS — N183 Chronic kidney disease, stage 3 unspecified: Secondary | ICD-10-CM | POA: Diagnosis not present

## 2020-07-26 DIAGNOSIS — I1310 Hypertensive heart and chronic kidney disease without heart failure, with stage 1 through stage 4 chronic kidney disease, or unspecified chronic kidney disease: Secondary | ICD-10-CM | POA: Diagnosis not present

## 2020-07-26 DIAGNOSIS — M549 Dorsalgia, unspecified: Secondary | ICD-10-CM | POA: Diagnosis not present

## 2020-07-26 DIAGNOSIS — I4891 Unspecified atrial fibrillation: Secondary | ICD-10-CM | POA: Diagnosis not present

## 2020-07-31 DIAGNOSIS — E785 Hyperlipidemia, unspecified: Secondary | ICD-10-CM | POA: Diagnosis not present

## 2020-07-31 DIAGNOSIS — E669 Obesity, unspecified: Secondary | ICD-10-CM | POA: Diagnosis not present

## 2020-07-31 DIAGNOSIS — Z9181 History of falling: Secondary | ICD-10-CM | POA: Diagnosis not present

## 2020-07-31 DIAGNOSIS — Z0000 Encounter for general adult medical examination without abnormal findings: Secondary | ICD-10-CM | POA: Diagnosis not present

## 2020-07-31 DIAGNOSIS — Z1331 Encounter for screening for depression: Secondary | ICD-10-CM | POA: Diagnosis not present

## 2020-08-06 DIAGNOSIS — M5136 Other intervertebral disc degeneration, lumbar region: Secondary | ICD-10-CM | POA: Diagnosis not present

## 2020-08-06 DIAGNOSIS — Z9689 Presence of other specified functional implants: Secondary | ICD-10-CM | POA: Diagnosis not present

## 2020-08-06 DIAGNOSIS — M48062 Spinal stenosis, lumbar region with neurogenic claudication: Secondary | ICD-10-CM | POA: Diagnosis not present

## 2020-08-06 DIAGNOSIS — Z96653 Presence of artificial knee joint, bilateral: Secondary | ICD-10-CM | POA: Diagnosis not present

## 2020-08-06 DIAGNOSIS — Z79891 Long term (current) use of opiate analgesic: Secondary | ICD-10-CM | POA: Diagnosis not present

## 2020-08-13 DIAGNOSIS — N1832 Chronic kidney disease, stage 3b: Secondary | ICD-10-CM | POA: Diagnosis not present

## 2020-08-26 DIAGNOSIS — I129 Hypertensive chronic kidney disease with stage 1 through stage 4 chronic kidney disease, or unspecified chronic kidney disease: Secondary | ICD-10-CM | POA: Diagnosis not present

## 2020-08-26 DIAGNOSIS — D631 Anemia in chronic kidney disease: Secondary | ICD-10-CM | POA: Diagnosis not present

## 2020-08-26 DIAGNOSIS — E1122 Type 2 diabetes mellitus with diabetic chronic kidney disease: Secondary | ICD-10-CM | POA: Diagnosis not present

## 2020-08-26 DIAGNOSIS — N2581 Secondary hyperparathyroidism of renal origin: Secondary | ICD-10-CM | POA: Diagnosis not present

## 2020-08-26 DIAGNOSIS — N1832 Chronic kidney disease, stage 3b: Secondary | ICD-10-CM | POA: Diagnosis not present

## 2020-09-05 DIAGNOSIS — Z1231 Encounter for screening mammogram for malignant neoplasm of breast: Secondary | ICD-10-CM | POA: Diagnosis not present

## 2020-09-10 DIAGNOSIS — Z79899 Other long term (current) drug therapy: Secondary | ICD-10-CM | POA: Diagnosis not present

## 2020-09-10 DIAGNOSIS — Z9682 Presence of neurostimulator: Secondary | ICD-10-CM | POA: Diagnosis not present

## 2020-09-10 DIAGNOSIS — Z79891 Long term (current) use of opiate analgesic: Secondary | ICD-10-CM | POA: Diagnosis not present

## 2020-09-10 DIAGNOSIS — M47816 Spondylosis without myelopathy or radiculopathy, lumbar region: Secondary | ICD-10-CM | POA: Diagnosis not present

## 2020-09-10 DIAGNOSIS — Z760 Encounter for issue of repeat prescription: Secondary | ICD-10-CM | POA: Diagnosis not present

## 2020-09-10 DIAGNOSIS — M48062 Spinal stenosis, lumbar region with neurogenic claudication: Secondary | ICD-10-CM | POA: Diagnosis not present

## 2020-09-10 DIAGNOSIS — M5136 Other intervertebral disc degeneration, lumbar region: Secondary | ICD-10-CM | POA: Diagnosis not present

## 2020-09-10 DIAGNOSIS — G8929 Other chronic pain: Secondary | ICD-10-CM | POA: Diagnosis not present

## 2020-10-09 DIAGNOSIS — M48062 Spinal stenosis, lumbar region with neurogenic claudication: Secondary | ICD-10-CM | POA: Diagnosis not present

## 2020-10-09 DIAGNOSIS — Z9689 Presence of other specified functional implants: Secondary | ICD-10-CM | POA: Diagnosis not present

## 2020-10-10 DIAGNOSIS — N390 Urinary tract infection, site not specified: Secondary | ICD-10-CM | POA: Diagnosis not present

## 2020-10-10 DIAGNOSIS — R339 Retention of urine, unspecified: Secondary | ICD-10-CM | POA: Diagnosis not present

## 2020-10-17 DIAGNOSIS — N390 Urinary tract infection, site not specified: Secondary | ICD-10-CM | POA: Diagnosis not present

## 2020-11-04 DIAGNOSIS — Z23 Encounter for immunization: Secondary | ICD-10-CM | POA: Diagnosis not present

## 2020-11-08 ENCOUNTER — Other Ambulatory Visit: Payer: Self-pay

## 2020-11-08 DIAGNOSIS — E119 Type 2 diabetes mellitus without complications: Secondary | ICD-10-CM | POA: Insufficient documentation

## 2020-11-08 DIAGNOSIS — I10 Essential (primary) hypertension: Secondary | ICD-10-CM | POA: Insufficient documentation

## 2020-11-11 ENCOUNTER — Encounter: Payer: Self-pay | Admitting: Cardiology

## 2020-11-11 ENCOUNTER — Other Ambulatory Visit: Payer: Self-pay

## 2020-11-11 ENCOUNTER — Ambulatory Visit: Payer: Medicare HMO | Admitting: Cardiology

## 2020-11-11 VITALS — BP 130/60 | HR 66 | Ht <= 58 in | Wt 168.6 lb

## 2020-11-11 DIAGNOSIS — I482 Chronic atrial fibrillation, unspecified: Secondary | ICD-10-CM

## 2020-11-11 DIAGNOSIS — I2510 Atherosclerotic heart disease of native coronary artery without angina pectoris: Secondary | ICD-10-CM | POA: Diagnosis not present

## 2020-11-11 DIAGNOSIS — E088 Diabetes mellitus due to underlying condition with unspecified complications: Secondary | ICD-10-CM

## 2020-11-11 DIAGNOSIS — I10 Essential (primary) hypertension: Secondary | ICD-10-CM

## 2020-11-11 NOTE — Progress Notes (Signed)
Cardiology Office Note:    Date:  11/11/2020   ID:  Lisa Maxwell 09-Jan-1946, MRN [id number]  PCP:  Nicoletta Dress, MD  Cardiologist:  Jenean Lindau, MD   Referring MD: Nicoletta Dress, MD    ASSESSMENT:    1. Coronary artery disease involving native coronary artery of native heart without angina pectoris   2. Chronic atrial fibrillation (HCC)   3. Essential hypertension   4. Diabetes mellitus due to underlying condition with unspecified complications ([place])    PLAN:    In order of problems listed above:  Coronary artery disease: Secondary prevention stressed with the patient.  Importance of compliance with diet medication stressed and she vocalized understanding.  She ambulates with a walker but she ambulates with a cane at home. Paroxysmal atrial fibrillation: I discussed with the patient atrial fibrillation, disease process. Management and therapy including rate and rhythm control, anticoagulation benefits and potential risks were discussed extensively with the patient. Patient had multiple questions which were answered to patient's satisfaction.  The patient has had significant multiple falls.  She ambulates with a walker outside she ambulates with a cane at home and I do not think she is steady enough.  I discussed with her risks of anticoagulation in view of the fact that she is having frequent falls and we came to mutual decision that we should discontinue anticoagulation in view of the fact that there is outweigh the benefits.  She understands this process of benefit less extensively. Essential hypertension: Blood pressure stable and diet was emphasized. Mixed dyslipidemia diabetes mellitus and obesity: Lifestyle modification urged lipids were reviewed and I told her to get the lab work when she has it done at the primary care provider and she is agreeable.  Weight reduction stressed risks of obesity explained. Patient will be seen in follow-up appointment in 6 months  or earlier if the patient has any concerns    Medication Adjustments/Labs and Tests Ordered: Current medicines are reviewed at length with the patient today.  Concerns regarding medicines are outlined above.  No orders of the defined types were placed in this encounter.  No orders of the defined types were placed in this encounter.    No chief complaint on file.    History of Present Illness:    Lisa Maxwell is a 75 y.o. female.  Patient has past medical history of coronary artery disease, essential hypertension, dyslipidemia, diabetes mellitus, obesity and paroxysmal atrial fibrillation.  She denies any problems from a cardiovascular standpoint.  No chest pain orthopnea or PND.  It appears she has 2-3 falls every 6 months.  These falls are significant.  She has hurt herself on the side significantly.  This is of concern as she is on anticoagulation.  At the time of my evaluation, the patient is alert awake oriented and in no distress.  Past Medical History:  Diagnosis Date   CAD (coronary artery disease) 11/04/2016   Cancer ([place])    skin cancer   Cerebrovascular disease    Chronic atrial fibrillation ([place]) 09/28/2018   Chronic bilateral low back pain without sciatica 03/08/2018   Chronic kidney disease    Chronic obstructive lung disease ([place]) 09/28/2018   Chronic pain syndrome 03/08/2018   Chronic renal failure 09/28/2018   CKD (chronic kidney disease) stage 3, GFR 30-59 ml/min (HCC)    DDD (degenerative disc disease), lumbar 11/17/2018   Last Assessment & Plan:  Formatting of this note might be different from the  original. See spinal stenosis plan   Diabetes ([place])    Diabetes mellitus due to underlying condition with unspecified complications ([name]) 12/11/2227   Dysrhythmia    afib   Essential hypertension 11/04/2016   Facet arthritis of lumbar region 03/08/2018   Facet joint disease 11/17/2018   Last Assessment & Plan:  Formatting of this note might be different from the original. See  spinal stenosis plan   Foraminal stenosis of lumbar region 03/08/2018   Heart murmur    History of knee replacement, total, bilateral 11/17/2018   Last Assessment & Plan:  Formatting of this note might be different from the original. Have recommended to her several times if any further concerns in regards to the pain status post arthroplasty, she is to follow up with her surgeon.   Hypertension    Hypertensive disorder 09/28/2018   Increased body mass index 09/28/2018   Lumbar radiculopathy 03/08/2018   Mixed dyslipidemia 11/04/2016   Morbid obesity ([place]) 11/04/2016   Osteoarthritis of left knee    PAF (paroxysmal atrial fibrillation) ([place]) 04/30/2015   Pain management contract agreement 04/13/2019   Last Assessment & Plan:  Formatting of this note might be different from the original. Contract updated today.  UDS completed.  [place] controlled substance registry reviewed and is consistent with her regimen.   Peripheral vascular disease ([place]) 09/28/2018   Preop cardiovascular exam 05/01/2019   Renal insufficiency 04/30/2015   S/P insertion of spinal cord stimulator 07/13/2019   Last Assessment & Plan:  Formatting of this note might be different from the original. About 5 months status post insertion of spinal cord stimulator with excellent relief.  She continues to keep close follow-up with her representative and has been working to appropriately find best programs to get her adequate pain relief.   Sleep apnea    Smoker 09/28/2018   Spinal stenosis of lumbar region with neurogenic claudication 03/08/2018   Last Assessment & Plan:  Formatting of this note is different from the original. 75 year old female with chronic low back pain and bilateral, right greater than left, L4-5 radicular pain, and claudication. She has substantial amount of multifactorial degenerative thoracic and lumbar spine pathology.  After being deemed an inappropriate open surgical candidate, and failing to respond to multiple in    Spondylosis of lumbosacral region without myelopathy or radiculopathy 01/30/2019   Last Assessment & Plan:  Formatting of this note might be different from the original. See spinal stenosis plan   Type 2 diabetes mellitus ([place]) 09/28/2018    Past Surgical History:  Procedure Laterality Date   ABDOMINAL HYSTERECTOMY     JOINT REPLACEMENT Bilateral    knees   SPINAL CORD STIMULATOR INSERTION N/A 07/07/2019   Procedure: LUMBAR SPINAL CORD STIMULATOR INSERTION;  Surgeon: Clydell Hakim, MD;  Location: [facility];  Service: Neurosurgery;  Laterality: N/A;   total left knee      Current Medications: Current Meds  Medication Sig   aspirin 81 MG tablet Take 81 mg by mouth daily.   carvedilol (COREG) 12.5 MG tablet Take 12.5 mg by mouth 2 (two) times daily.   furosemide (LASIX) 40 MG tablet Take 40 mg by mouth daily.    gabapentin (NEURONTIN) 300 MG capsule Take 300 mg by mouth 2 (two) times daily.   HYDROcodone-acetaminophen (NORCO) 7.5-325 MG tablet Take 1 tablet by mouth at bedtime.   loratadine (CLARITIN) 10 MG tablet Take 10 mg by mouth daily.   losartan (COZAAR) 50 MG tablet Take 50 mg by  mouth daily.   lovastatin (MEVACOR) 40 MG tablet Take 40 mg by mouth at bedtime.    nitroGLYCERIN (NITROSTAT) 0.4 MG SL tablet Place 0.4 mg under the tongue every 5 (five) minutes as needed for chest pain.   PROAIR HFA 108 (90 Base) MCG/ACT inhaler Inhale 1-2 puffs into the lungs every 4 (four) hours as needed for wheezing or shortness of breath.    spironolactone (ALDACTONE) 25 MG tablet Take 25 mg by mouth daily.   tamsulosin (FLOMAX) 0.4 MG CAPS capsule Take 0.4 mg by mouth daily after supper.   TRULICITY 3 LZ/7.6BH SOPN Inject 3 mg into the skin once a week.   umeclidinium-vilanterol (ANORO ELLIPTA) 62.5-25 MCG/INH AEPB Inhale 1 puff into the lungs daily.   VASCEPA 1 g capsule Take 1 g by mouth 2 (two) times daily.   vitamin B-12 (CYANOCOBALAMIN) 500 MCG tablet Take 500 mcg by mouth daily.     [DISCONTINUED] apixaban (ELIQUIS) 5 MG TABS tablet Take 1 tablet (5 mg total) by mouth 2 (two) times daily.     Allergies:   Patient has no known allergies.   Social History   Socioeconomic History   Marital status: Married    Spouse name: Not on file   Number of children: Not on file   Years of education: Not on file   Highest education level: Not on file  Occupational History   Not on file  Tobacco Use   Smoking status: Former    Types: Cigarettes    Quit date: 05/08/2019    Years since quitting: 1.5   Smokeless tobacco: Never  Vaping Use   Vaping Use: Never used  Substance and Sexual Activity   Alcohol use: No   Drug use: No   Sexual activity: Not on file  Other Topics Concern   Not on file  Social History Narrative   Not on file   Social Determinants of Health   Financial Resource Strain: Not on file  Food Insecurity: Not on file  Transportation Needs: Not on file  Physical Activity: Not on file  Stress: Not on file  Social Connections: Not on file     Family History: The patient's family history includes Diabetes in her mother.  ROS:   Please see the history of present illness.    All other systems reviewed and are negative.  EKGs/Labs/Other Studies Reviewed:    The following studies were reviewed today: I discussed my findings with the patient at length.   Recent Labs: No results found for requested labs within last 8760 hours.  Recent Lipid Panel No results found for: CHOL, TRIG, HDL, CHOLHDL, VLDL, LDLCALC, LDLDIRECT  Physical Exam:    VS:  BP 130/60   Pulse 66   Ht 4\' 8"  (1.422 m)   Wt 168 lb 9.6 oz (76.5 kg)   SpO2 96%   BMI 37.80 kg/m     Wt Readings from Last 3 Encounters:  11/11/20 168 lb 9.6 oz (76.5 kg)  04/15/20 179 lb 12.8 oz (81.6 kg)  07/07/19 194 lb 0.1 oz (88 kg)     GEN: Patient is in no acute distress HEENT: Normal NECK: No JVD; No carotid bruits LYMPHATICS: No lymphadenopathy CARDIAC: Hear sounds regular, 2/6  systolic murmur at the apex. RESPIRATORY:  Clear to auscultation without rales, wheezing or rhonchi  ABDOMEN: Soft, non-tender, non-distended MUSCULOSKELETAL:  No edema; No deformity  SKIN: Warm and dry NEUROLOGIC:  Alert and oriented x 3 PSYCHIATRIC:  Normal affect   Signed,  Jenean Lindau, MD  11/11/2020 1:56 PM    [facility]

## 2020-11-11 NOTE — Patient Instructions (Signed)
Medication Instructions:  Your physician has recommended you make the following change in your medication:   Stop Eliquis  *If you need a refill on your cardiac medications before your next appointment, please call your pharmacy*   Lab Work: None ordered If you have labs (blood work) drawn today and your tests are completely normal, you will receive your results only by: [place] (if you have MyChart) OR A paper copy in the mail If you have any lab test that is abnormal or we need to change your treatment, we will call you to review the results.   Testing/Procedures: None ordered   Follow-Up: At [facility], you and your health needs are our priority.  As part of our continuing mission to provide you with exceptional heart care, we have created designated Provider Care Teams.  These Care Teams include your primary Cardiologist (physician) and Advanced Practice Providers (APPs -  Physician Assistants and Nurse Practitioners) who all work together to provide you with the care you need, when you need it.  We recommend signing up for the patient portal called "MyChart".  Sign up information is provided on this After Visit Summary.  MyChart is used to connect with patients for Virtual Visits (Telemedicine).  Patients are able to view lab/test results, encounter notes, upcoming appointments, etc.  Non-urgent messages can be sent to your provider as well.   To learn more about what you can do with MyChart, go to [email or web address].    Your next appointment:   6 month(s)  The format for your next appointment:   In Person  Provider:   Jyl Heinz, MD   Other Instructions NA

## 2020-11-20 DIAGNOSIS — S46219A Strain of muscle, fascia and tendon of other parts of biceps, unspecified arm, initial encounter: Secondary | ICD-10-CM | POA: Diagnosis not present

## 2020-11-20 DIAGNOSIS — Z6837 Body mass index (BMI) 37.0-37.9, adult: Secondary | ICD-10-CM | POA: Diagnosis not present

## 2020-11-20 DIAGNOSIS — M19012 Primary osteoarthritis, left shoulder: Secondary | ICD-10-CM | POA: Diagnosis not present

## 2020-11-20 DIAGNOSIS — M25512 Pain in left shoulder: Secondary | ICD-10-CM | POA: Diagnosis not present

## 2020-11-21 DIAGNOSIS — M25512 Pain in left shoulder: Secondary | ICD-10-CM | POA: Diagnosis not present

## 2020-11-21 DIAGNOSIS — M66822 Spontaneous rupture of other tendons, left upper arm: Secondary | ICD-10-CM | POA: Diagnosis not present

## 2020-11-21 DIAGNOSIS — M66829 Spontaneous rupture of other tendons, unspecified upper arm: Secondary | ICD-10-CM | POA: Diagnosis not present

## 2020-11-29 DIAGNOSIS — I4891 Unspecified atrial fibrillation: Secondary | ICD-10-CM | POA: Diagnosis not present

## 2020-11-29 DIAGNOSIS — N183 Chronic kidney disease, stage 3 unspecified: Secondary | ICD-10-CM | POA: Diagnosis not present

## 2020-11-29 DIAGNOSIS — M549 Dorsalgia, unspecified: Secondary | ICD-10-CM | POA: Diagnosis not present

## 2020-11-29 DIAGNOSIS — E7800 Pure hypercholesterolemia, unspecified: Secondary | ICD-10-CM | POA: Diagnosis not present

## 2020-11-29 DIAGNOSIS — S46219A Strain of muscle, fascia and tendon of other parts of biceps, unspecified arm, initial encounter: Secondary | ICD-10-CM | POA: Diagnosis not present

## 2020-11-29 DIAGNOSIS — E1122 Type 2 diabetes mellitus with diabetic chronic kidney disease: Secondary | ICD-10-CM | POA: Diagnosis not present

## 2020-11-29 DIAGNOSIS — J449 Chronic obstructive pulmonary disease, unspecified: Secondary | ICD-10-CM | POA: Diagnosis not present

## 2020-11-29 DIAGNOSIS — I1310 Hypertensive heart and chronic kidney disease without heart failure, with stage 1 through stage 4 chronic kidney disease, or unspecified chronic kidney disease: Secondary | ICD-10-CM | POA: Diagnosis not present

## 2020-11-29 DIAGNOSIS — N184 Chronic kidney disease, stage 4 (severe): Secondary | ICD-10-CM | POA: Diagnosis not present

## 2020-12-03 DIAGNOSIS — Z79891 Long term (current) use of opiate analgesic: Secondary | ICD-10-CM | POA: Diagnosis not present

## 2020-12-03 DIAGNOSIS — G8929 Other chronic pain: Secondary | ICD-10-CM | POA: Diagnosis not present

## 2020-12-03 DIAGNOSIS — M5136 Other intervertebral disc degeneration, lumbar region: Secondary | ICD-10-CM | POA: Diagnosis not present

## 2020-12-03 DIAGNOSIS — M47816 Spondylosis without myelopathy or radiculopathy, lumbar region: Secondary | ICD-10-CM | POA: Diagnosis not present

## 2020-12-03 DIAGNOSIS — Z5181 Encounter for therapeutic drug level monitoring: Secondary | ICD-10-CM | POA: Diagnosis not present

## 2020-12-03 DIAGNOSIS — Z9682 Presence of neurostimulator: Secondary | ICD-10-CM | POA: Diagnosis not present

## 2020-12-03 DIAGNOSIS — Z760 Encounter for issue of repeat prescription: Secondary | ICD-10-CM | POA: Diagnosis not present

## 2020-12-03 DIAGNOSIS — Z79899 Other long term (current) drug therapy: Secondary | ICD-10-CM | POA: Diagnosis not present

## 2020-12-03 DIAGNOSIS — M25512 Pain in left shoulder: Secondary | ICD-10-CM | POA: Diagnosis not present

## 2020-12-04 ENCOUNTER — Other Ambulatory Visit (HOSPITAL_COMMUNITY): Payer: Self-pay | Admitting: Physician Assistant

## 2020-12-04 ENCOUNTER — Other Ambulatory Visit: Payer: Self-pay | Admitting: Physician Assistant

## 2020-12-04 DIAGNOSIS — M25512 Pain in left shoulder: Secondary | ICD-10-CM

## 2020-12-13 ENCOUNTER — Ambulatory Visit (HOSPITAL_COMMUNITY)
Admission: RE | Admit: 2020-12-13 | Discharge: 2020-12-13 | Disposition: A | Discharge status: Home / Self Care | Payer: Medicare HMO | Source: Ambulatory Visit | Attending: Physician Assistant | Admitting: Physician Assistant

## 2020-12-13 ENCOUNTER — Other Ambulatory Visit: Payer: Self-pay

## 2020-12-13 DIAGNOSIS — M25512 Pain in left shoulder: Secondary | ICD-10-CM | POA: Insufficient documentation

## 2020-12-13 DIAGNOSIS — S46012A Strain of muscle(s) and tendon(s) of the rotator cuff of left shoulder, initial encounter: Secondary | ICD-10-CM | POA: Diagnosis not present

## 2020-12-13 DIAGNOSIS — M19012 Primary osteoarthritis, left shoulder: Secondary | ICD-10-CM | POA: Diagnosis not present

## 2020-12-13 DIAGNOSIS — M25412 Effusion, left shoulder: Secondary | ICD-10-CM | POA: Diagnosis not present

## 2020-12-13 MED ORDER — GADOBUTROL 1 MMOL/ML IV SOLN
7.5000 mL | Freq: Once | INTRAVENOUS | Status: AC | PRN
  Administered 2020-12-13: 7.5 mL via INTRAVENOUS

## 2020-12-17 DIAGNOSIS — J4530 Mild persistent asthma, uncomplicated: Secondary | ICD-10-CM | POA: Diagnosis not present

## 2020-12-17 DIAGNOSIS — G4733 Obstructive sleep apnea (adult) (pediatric): Secondary | ICD-10-CM | POA: Diagnosis not present

## 2020-12-17 DIAGNOSIS — Z23 Encounter for immunization: Secondary | ICD-10-CM | POA: Diagnosis not present

## 2020-12-19 DIAGNOSIS — M75122 Complete rotator cuff tear or rupture of left shoulder, not specified as traumatic: Secondary | ICD-10-CM | POA: Diagnosis not present

## 2020-12-19 DIAGNOSIS — M25512 Pain in left shoulder: Secondary | ICD-10-CM | POA: Diagnosis not present

## 2020-12-20 ENCOUNTER — Telehealth: Payer: Self-pay

## 2020-12-20 DIAGNOSIS — S0003XA Contusion of scalp, initial encounter: Secondary | ICD-10-CM | POA: Diagnosis not present

## 2020-12-20 DIAGNOSIS — M19012 Primary osteoarthritis, left shoulder: Secondary | ICD-10-CM | POA: Diagnosis not present

## 2020-12-20 DIAGNOSIS — S4992XA Unspecified injury of left shoulder and upper arm, initial encounter: Secondary | ICD-10-CM | POA: Diagnosis not present

## 2020-12-20 DIAGNOSIS — R0902 Hypoxemia: Secondary | ICD-10-CM | POA: Diagnosis not present

## 2020-12-20 DIAGNOSIS — S0181XA Laceration without foreign body of other part of head, initial encounter: Secondary | ICD-10-CM | POA: Diagnosis not present

## 2020-12-20 DIAGNOSIS — M25512 Pain in left shoulder: Secondary | ICD-10-CM | POA: Diagnosis not present

## 2020-12-20 DIAGNOSIS — M7989 Other specified soft tissue disorders: Secondary | ICD-10-CM | POA: Diagnosis not present

## 2020-12-20 DIAGNOSIS — S0990XA Unspecified injury of head, initial encounter: Secondary | ICD-10-CM | POA: Diagnosis not present

## 2020-12-20 DIAGNOSIS — Z743 Need for continuous supervision: Secondary | ICD-10-CM | POA: Diagnosis not present

## 2020-12-20 DIAGNOSIS — R58 Hemorrhage, not elsewhere classified: Secondary | ICD-10-CM | POA: Diagnosis not present

## 2020-12-20 DIAGNOSIS — M50323 Other cervical disc degeneration at C6-C7 level: Secondary | ICD-10-CM | POA: Diagnosis not present

## 2020-12-20 DIAGNOSIS — M549 Dorsalgia, unspecified: Secondary | ICD-10-CM | POA: Diagnosis not present

## 2020-12-20 DIAGNOSIS — G8929 Other chronic pain: Secondary | ICD-10-CM | POA: Diagnosis not present

## 2020-12-20 DIAGNOSIS — S199XXA Unspecified injury of neck, initial encounter: Secondary | ICD-10-CM | POA: Diagnosis not present

## 2020-12-20 DIAGNOSIS — M50322 Other cervical disc degeneration at C5-C6 level: Secondary | ICD-10-CM | POA: Diagnosis not present

## 2020-12-20 DIAGNOSIS — R9082 White matter disease, unspecified: Secondary | ICD-10-CM | POA: Diagnosis not present

## 2020-12-20 DIAGNOSIS — M4312 Spondylolisthesis, cervical region: Secondary | ICD-10-CM | POA: Diagnosis not present

## 2020-12-20 NOTE — Telephone Encounter (Signed)
   [facility] Pre-operative Risk Assessment    Patient Name: Lisa Maxwell  DOB: July 21, 1945 MRN: [id number]  HEARTCARE STAFF:  - IMPORTANT!!!!!! Under Visit Info/Reason for Call, type in Other and utilize the format Clearance MM/DD/YY or Clearance TBD. Do not use dashes or single digits. - Please review there is not already an duplicate clearance open for this procedure. - If request is for dental extraction, please clarify the # of teeth to be extracted. - If the patient is currently at the dentist's office, call Pre-Op Callback Staff (MA/nurse) to input urgent request.  - If the patient is not currently in the dentist office, please route to the Pre-Op pool.  Request for surgical clearance:  What type of surgery is being performed? Left rotator cuff repair  When is this surgery scheduled? TBD  What type of clearance is required (medical clearance vs. Pharmacy clearance to hold med vs. Both)? medical  Are there any medications that need to be held prior to surgery and how long? no  Practice name and name of physician performing surgery? [facility],  Dr. Susa Maxwell  What is the office phone number? [phone]   7.   What is the office fax number? [phone]  8.   Anesthesia type (None, local, MAC, general) ? Not listed   Lisa Maxwell 12/20/2020, 8:47 AM  _________________________________________________________________   (provider comments below)

## 2020-12-23 NOTE — Telephone Encounter (Signed)
   Primary Cardiologist: Jenean Lindau, MD  Chart reviewed as part of pre-operative protocol coverage. Given past medical history and time since last visit, based on ACC/AHA guidelines, Lisa Maxwell would be at acceptable risk for the planned procedure without further cardiovascular testing.   I will route this recommendation to the requesting party via Epic fax function and remove from pre-op pool.  Please call with questions.  [name]. [name] NP-C    12/23/2020, 10:22 AM [place] [address] Office [phone] Fax [phone]

## 2021-01-07 DIAGNOSIS — M48062 Spinal stenosis, lumbar region with neurogenic claudication: Secondary | ICD-10-CM | POA: Diagnosis not present

## 2021-01-07 DIAGNOSIS — Z6834 Body mass index (BMI) 34.0-34.9, adult: Secondary | ICD-10-CM | POA: Diagnosis not present

## 2021-01-07 DIAGNOSIS — Z9689 Presence of other specified functional implants: Secondary | ICD-10-CM | POA: Diagnosis not present

## 2021-01-13 DIAGNOSIS — Z01818 Encounter for other preprocedural examination: Secondary | ICD-10-CM | POA: Diagnosis not present

## 2021-01-13 DIAGNOSIS — Z6836 Body mass index (BMI) 36.0-36.9, adult: Secondary | ICD-10-CM | POA: Diagnosis not present

## 2021-01-27 DIAGNOSIS — G8918 Other acute postprocedural pain: Secondary | ICD-10-CM | POA: Diagnosis not present

## 2021-01-27 DIAGNOSIS — S43422A Sprain of left rotator cuff capsule, initial encounter: Secondary | ICD-10-CM | POA: Diagnosis not present

## 2021-01-27 DIAGNOSIS — S46012A Strain of muscle(s) and tendon(s) of the rotator cuff of left shoulder, initial encounter: Secondary | ICD-10-CM | POA: Diagnosis not present

## 2021-01-27 DIAGNOSIS — M7542 Impingement syndrome of left shoulder: Secondary | ICD-10-CM | POA: Diagnosis not present

## 2021-01-30 DIAGNOSIS — I10 Essential (primary) hypertension: Secondary | ICD-10-CM | POA: Diagnosis not present

## 2021-01-30 DIAGNOSIS — E1129 Type 2 diabetes mellitus with other diabetic kidney complication: Secondary | ICD-10-CM | POA: Diagnosis not present

## 2021-01-30 DIAGNOSIS — E785 Hyperlipidemia, unspecified: Secondary | ICD-10-CM | POA: Diagnosis not present

## 2021-02-11 DIAGNOSIS — N390 Urinary tract infection, site not specified: Secondary | ICD-10-CM | POA: Diagnosis not present

## 2021-02-11 DIAGNOSIS — R339 Retention of urine, unspecified: Secondary | ICD-10-CM | POA: Diagnosis not present

## 2021-02-24 DIAGNOSIS — M25512 Pain in left shoulder: Secondary | ICD-10-CM | POA: Diagnosis not present

## 2021-02-24 DIAGNOSIS — M75102 Unspecified rotator cuff tear or rupture of left shoulder, not specified as traumatic: Secondary | ICD-10-CM | POA: Diagnosis not present

## 2021-02-24 DIAGNOSIS — Z4889 Encounter for other specified surgical aftercare: Secondary | ICD-10-CM | POA: Diagnosis not present

## 2021-02-26 DIAGNOSIS — M25512 Pain in left shoulder: Secondary | ICD-10-CM | POA: Diagnosis not present

## 2021-02-26 DIAGNOSIS — M75102 Unspecified rotator cuff tear or rupture of left shoulder, not specified as traumatic: Secondary | ICD-10-CM | POA: Diagnosis not present

## 2021-02-26 DIAGNOSIS — Z4889 Encounter for other specified surgical aftercare: Secondary | ICD-10-CM | POA: Diagnosis not present

## 2021-03-04 DIAGNOSIS — M75102 Unspecified rotator cuff tear or rupture of left shoulder, not specified as traumatic: Secondary | ICD-10-CM | POA: Diagnosis not present

## 2021-03-04 DIAGNOSIS — M5136 Other intervertebral disc degeneration, lumbar region: Secondary | ICD-10-CM | POA: Diagnosis not present

## 2021-03-04 DIAGNOSIS — Z96653 Presence of artificial knee joint, bilateral: Secondary | ICD-10-CM | POA: Diagnosis not present

## 2021-03-04 DIAGNOSIS — M47817 Spondylosis without myelopathy or radiculopathy, lumbosacral region: Secondary | ICD-10-CM | POA: Diagnosis not present

## 2021-03-04 DIAGNOSIS — Z9689 Presence of other specified functional implants: Secondary | ICD-10-CM | POA: Diagnosis not present

## 2021-03-04 DIAGNOSIS — F1190 Opioid use, unspecified, uncomplicated: Secondary | ICD-10-CM

## 2021-03-04 DIAGNOSIS — Z4889 Encounter for other specified surgical aftercare: Secondary | ICD-10-CM | POA: Diagnosis not present

## 2021-03-04 DIAGNOSIS — M25512 Pain in left shoulder: Secondary | ICD-10-CM | POA: Diagnosis not present

## 2021-03-04 DIAGNOSIS — M48062 Spinal stenosis, lumbar region with neurogenic claudication: Secondary | ICD-10-CM | POA: Diagnosis not present

## 2021-03-04 DIAGNOSIS — Z79891 Long term (current) use of opiate analgesic: Secondary | ICD-10-CM | POA: Diagnosis not present

## 2021-03-04 DIAGNOSIS — G8929 Other chronic pain: Secondary | ICD-10-CM | POA: Diagnosis not present

## 2021-03-04 HISTORY — DX: Opioid use, unspecified, uncomplicated: F11.90

## 2021-03-07 DIAGNOSIS — M25512 Pain in left shoulder: Secondary | ICD-10-CM | POA: Diagnosis not present

## 2021-03-07 DIAGNOSIS — Z4889 Encounter for other specified surgical aftercare: Secondary | ICD-10-CM | POA: Diagnosis not present

## 2021-03-07 DIAGNOSIS — M75102 Unspecified rotator cuff tear or rupture of left shoulder, not specified as traumatic: Secondary | ICD-10-CM | POA: Diagnosis not present

## 2021-03-10 DIAGNOSIS — Z4889 Encounter for other specified surgical aftercare: Secondary | ICD-10-CM | POA: Diagnosis not present

## 2021-03-10 DIAGNOSIS — M25512 Pain in left shoulder: Secondary | ICD-10-CM | POA: Diagnosis not present

## 2021-03-10 DIAGNOSIS — M75102 Unspecified rotator cuff tear or rupture of left shoulder, not specified as traumatic: Secondary | ICD-10-CM | POA: Diagnosis not present

## 2021-03-12 DIAGNOSIS — M75102 Unspecified rotator cuff tear or rupture of left shoulder, not specified as traumatic: Secondary | ICD-10-CM | POA: Diagnosis not present

## 2021-03-12 DIAGNOSIS — Z4889 Encounter for other specified surgical aftercare: Secondary | ICD-10-CM | POA: Diagnosis not present

## 2021-03-12 DIAGNOSIS — M25512 Pain in left shoulder: Secondary | ICD-10-CM | POA: Diagnosis not present

## 2021-03-17 DIAGNOSIS — S80919A Unspecified superficial injury of unspecified knee, initial encounter: Secondary | ICD-10-CM | POA: Diagnosis not present

## 2021-03-17 DIAGNOSIS — M25512 Pain in left shoulder: Secondary | ICD-10-CM | POA: Diagnosis not present

## 2021-03-17 DIAGNOSIS — G4733 Obstructive sleep apnea (adult) (pediatric): Secondary | ICD-10-CM | POA: Diagnosis not present

## 2021-03-17 DIAGNOSIS — M75102 Unspecified rotator cuff tear or rupture of left shoulder, not specified as traumatic: Secondary | ICD-10-CM | POA: Diagnosis not present

## 2021-03-17 DIAGNOSIS — M79671 Pain in right foot: Secondary | ICD-10-CM | POA: Diagnosis not present

## 2021-03-17 DIAGNOSIS — Z4889 Encounter for other specified surgical aftercare: Secondary | ICD-10-CM | POA: Diagnosis not present

## 2021-03-19 DIAGNOSIS — M75102 Unspecified rotator cuff tear or rupture of left shoulder, not specified as traumatic: Secondary | ICD-10-CM | POA: Diagnosis not present

## 2021-03-19 DIAGNOSIS — Z4889 Encounter for other specified surgical aftercare: Secondary | ICD-10-CM | POA: Diagnosis not present

## 2021-03-19 DIAGNOSIS — M25512 Pain in left shoulder: Secondary | ICD-10-CM | POA: Diagnosis not present

## 2021-03-24 DIAGNOSIS — G4733 Obstructive sleep apnea (adult) (pediatric): Secondary | ICD-10-CM | POA: Diagnosis not present

## 2021-03-24 DIAGNOSIS — Z4889 Encounter for other specified surgical aftercare: Secondary | ICD-10-CM | POA: Diagnosis not present

## 2021-03-24 DIAGNOSIS — M75102 Unspecified rotator cuff tear or rupture of left shoulder, not specified as traumatic: Secondary | ICD-10-CM | POA: Diagnosis not present

## 2021-03-24 DIAGNOSIS — Z23 Encounter for immunization: Secondary | ICD-10-CM | POA: Diagnosis not present

## 2021-03-24 DIAGNOSIS — J4530 Mild persistent asthma, uncomplicated: Secondary | ICD-10-CM | POA: Diagnosis not present

## 2021-03-24 DIAGNOSIS — M25512 Pain in left shoulder: Secondary | ICD-10-CM | POA: Diagnosis not present

## 2021-03-26 DIAGNOSIS — M75102 Unspecified rotator cuff tear or rupture of left shoulder, not specified as traumatic: Secondary | ICD-10-CM | POA: Diagnosis not present

## 2021-03-26 DIAGNOSIS — M25512 Pain in left shoulder: Secondary | ICD-10-CM | POA: Diagnosis not present

## 2021-03-26 DIAGNOSIS — Z4889 Encounter for other specified surgical aftercare: Secondary | ICD-10-CM | POA: Diagnosis not present

## 2021-04-03 DIAGNOSIS — I4891 Unspecified atrial fibrillation: Secondary | ICD-10-CM | POA: Diagnosis not present

## 2021-04-03 DIAGNOSIS — J449 Chronic obstructive pulmonary disease, unspecified: Secondary | ICD-10-CM | POA: Diagnosis not present

## 2021-04-03 DIAGNOSIS — M79671 Pain in right foot: Secondary | ICD-10-CM | POA: Diagnosis not present

## 2021-04-03 DIAGNOSIS — S80919A Unspecified superficial injury of unspecified knee, initial encounter: Secondary | ICD-10-CM | POA: Diagnosis not present

## 2021-04-03 DIAGNOSIS — I1310 Hypertensive heart and chronic kidney disease without heart failure, with stage 1 through stage 4 chronic kidney disease, or unspecified chronic kidney disease: Secondary | ICD-10-CM | POA: Diagnosis not present

## 2021-04-03 DIAGNOSIS — E1122 Type 2 diabetes mellitus with diabetic chronic kidney disease: Secondary | ICD-10-CM | POA: Diagnosis not present

## 2021-04-03 DIAGNOSIS — E7800 Pure hypercholesterolemia, unspecified: Secondary | ICD-10-CM | POA: Diagnosis not present

## 2021-04-03 DIAGNOSIS — N183 Chronic kidney disease, stage 3 unspecified: Secondary | ICD-10-CM | POA: Diagnosis not present

## 2021-04-03 DIAGNOSIS — N184 Chronic kidney disease, stage 4 (severe): Secondary | ICD-10-CM | POA: Diagnosis not present

## 2021-04-04 DIAGNOSIS — E782 Mixed hyperlipidemia: Secondary | ICD-10-CM | POA: Diagnosis not present

## 2021-04-04 DIAGNOSIS — I10 Essential (primary) hypertension: Secondary | ICD-10-CM | POA: Diagnosis not present

## 2021-04-04 DIAGNOSIS — E1129 Type 2 diabetes mellitus with other diabetic kidney complication: Secondary | ICD-10-CM | POA: Diagnosis not present

## 2021-04-08 DIAGNOSIS — N1832 Chronic kidney disease, stage 3b: Secondary | ICD-10-CM | POA: Diagnosis not present

## 2021-04-14 DIAGNOSIS — D631 Anemia in chronic kidney disease: Secondary | ICD-10-CM | POA: Diagnosis not present

## 2021-04-14 DIAGNOSIS — N2581 Secondary hyperparathyroidism of renal origin: Secondary | ICD-10-CM | POA: Diagnosis not present

## 2021-04-14 DIAGNOSIS — N1832 Chronic kidney disease, stage 3b: Secondary | ICD-10-CM | POA: Diagnosis not present

## 2021-04-14 DIAGNOSIS — I129 Hypertensive chronic kidney disease with stage 1 through stage 4 chronic kidney disease, or unspecified chronic kidney disease: Secondary | ICD-10-CM | POA: Diagnosis not present

## 2021-04-14 DIAGNOSIS — E1122 Type 2 diabetes mellitus with diabetic chronic kidney disease: Secondary | ICD-10-CM | POA: Diagnosis not present

## 2021-04-23 DIAGNOSIS — Z87891 Personal history of nicotine dependence: Secondary | ICD-10-CM | POA: Diagnosis not present

## 2021-04-29 DIAGNOSIS — L301 Dyshidrosis [pompholyx]: Secondary | ICD-10-CM | POA: Insufficient documentation

## 2021-04-29 DIAGNOSIS — E119 Type 2 diabetes mellitus without complications: Secondary | ICD-10-CM | POA: Insufficient documentation

## 2021-04-29 HISTORY — DX: Dyshidrosis (pompholyx): L30.1

## 2021-04-29 HISTORY — DX: Type 2 diabetes mellitus without complications: E11.9

## 2021-05-12 DIAGNOSIS — G4733 Obstructive sleep apnea (adult) (pediatric): Secondary | ICD-10-CM | POA: Diagnosis not present

## 2021-05-12 DIAGNOSIS — J4530 Mild persistent asthma, uncomplicated: Secondary | ICD-10-CM | POA: Diagnosis not present

## 2021-05-12 DIAGNOSIS — R911 Solitary pulmonary nodule: Secondary | ICD-10-CM | POA: Diagnosis not present

## 2021-05-19 ENCOUNTER — Other Ambulatory Visit: Payer: Self-pay

## 2021-05-20 ENCOUNTER — Other Ambulatory Visit: Payer: Self-pay

## 2021-05-20 ENCOUNTER — Encounter: Payer: Self-pay | Admitting: Cardiology

## 2021-05-20 ENCOUNTER — Ambulatory Visit: Payer: Medicare HMO | Admitting: Cardiology

## 2021-05-20 VITALS — BP 98/60 | HR 62 | Ht <= 58 in | Wt 164.4 lb

## 2021-05-20 DIAGNOSIS — M25512 Pain in left shoulder: Secondary | ICD-10-CM | POA: Diagnosis not present

## 2021-05-20 DIAGNOSIS — E088 Diabetes mellitus due to underlying condition with unspecified complications: Secondary | ICD-10-CM | POA: Diagnosis not present

## 2021-05-20 DIAGNOSIS — Z9689 Presence of other specified functional implants: Secondary | ICD-10-CM | POA: Diagnosis not present

## 2021-05-20 DIAGNOSIS — M5136 Other intervertebral disc degeneration, lumbar region: Secondary | ICD-10-CM | POA: Diagnosis not present

## 2021-05-20 DIAGNOSIS — M48062 Spinal stenosis, lumbar region with neurogenic claudication: Secondary | ICD-10-CM | POA: Diagnosis not present

## 2021-05-20 DIAGNOSIS — I2510 Atherosclerotic heart disease of native coronary artery without angina pectoris: Secondary | ICD-10-CM | POA: Diagnosis not present

## 2021-05-20 DIAGNOSIS — I480 Paroxysmal atrial fibrillation: Secondary | ICD-10-CM | POA: Diagnosis not present

## 2021-05-20 DIAGNOSIS — I10 Essential (primary) hypertension: Secondary | ICD-10-CM | POA: Diagnosis not present

## 2021-05-20 DIAGNOSIS — Z96653 Presence of artificial knee joint, bilateral: Secondary | ICD-10-CM | POA: Diagnosis not present

## 2021-05-20 DIAGNOSIS — Z5181 Encounter for therapeutic drug level monitoring: Secondary | ICD-10-CM | POA: Diagnosis not present

## 2021-05-20 DIAGNOSIS — E6609 Other obesity due to excess calories: Secondary | ICD-10-CM

## 2021-05-20 DIAGNOSIS — E661 Drug-induced obesity: Secondary | ICD-10-CM | POA: Diagnosis not present

## 2021-05-20 DIAGNOSIS — F1190 Opioid use, unspecified, uncomplicated: Secondary | ICD-10-CM | POA: Diagnosis not present

## 2021-05-20 DIAGNOSIS — M47817 Spondylosis without myelopathy or radiculopathy, lumbosacral region: Secondary | ICD-10-CM | POA: Diagnosis not present

## 2021-05-20 HISTORY — DX: Other obesity due to excess calories: E66.09

## 2021-05-20 NOTE — Patient Instructions (Signed)
Medication Instructions:  Your physician recommends that you continue on your current medications as directed. Please refer to the Current Medication list given to you today.  *If you need a refill on your cardiac medications before your next appointment, please call your pharmacy*   Lab Work: None ordered If you have labs (blood work) drawn today and your tests are completely normal, you will receive your results only by: [place] (if you have MyChart) OR A paper copy in the mail If you have any lab test that is abnormal or we need to change your treatment, we will call you to review the results.   Testing/Procedures: None ordered   Follow-Up: At [facility], you and your health needs are our priority.  As part of our continuing mission to provide you with exceptional heart care, we have created designated Provider Care Teams.  These Care Teams include your primary Cardiologist (physician) and Advanced Practice Providers (APPs -  Physician Assistants and Nurse Practitioners) who all work together to provide you with the care you need, when you need it.  We recommend signing up for the patient portal called "MyChart".  Sign up information is provided on this After Visit Summary.  MyChart is used to connect with patients for Virtual Visits (Telemedicine).  Patients are able to view lab/test results, encounter notes, upcoming appointments, etc.  Non-urgent messages can be sent to your provider as well.   To learn more about what you can do with MyChart, go to [email or web address].    Your next appointment:   6 month(s)  The format for your next appointment:   In Person  Provider:   Jyl Heinz, MD   Other Instructions  Blood Pressure Record Sheet To take your blood pressure, you will need a blood pressure machine. You can buy a blood pressure machine (blood pressure monitor) at your clinic, drug store, or online. When choosing one, consider: An automatic monitor  that has an arm cuff. A cuff that wraps snugly around your upper arm. You should be able to fit only one finger between your arm and the cuff. A device that stores blood pressure reading results. Do not choose a monitor that measures your blood pressure from your wrist or finger. Follow your health care provider's instructions for how to take your blood pressure. To use this form: Get one reading in the morning (a.m.) 1-2 hours after you take any medicines. Get one reading in the evening (p.m.) before supper. Take at least 2 readings with each blood pressure check. This makes sure the results are correct. Wait 1-2 minutes between measurements. Write down the results in the spaces on this form. Repeat this once a week, or as told by your health care provider.  Make a follow-up appointment with your health care provider to discuss the results. Blood pressure log Date: _______________________ a.m. _____________________(1st reading) HR___________            p.m. _____________________(2nd reading) HR__________  Date: _______________________ a.m. _____________________(1st reading) HR___________            p.m. _____________________(2nd reading) HR__________ Date: _______________________ a.m. _____________________(1st reading) HR___________            p.m. _____________________(2nd reading) HR__________ Date: _______________________ a.m. _____________________(1st reading) HR___________            p.m. _____________________(2nd reading) HR__________  Date: _______________________ a.m. _____________________(1st reading) HR___________            p.m. _____________________(2nd reading) HR__________  Date: _______________________ a.m. _____________________(1st reading) HR___________  p.m. _____________________(2nd reading) HR__________ ° °Date: _______________________ °a.m. _____________________(1st reading) HR___________ ° °          p.m. _____________________(2nd reading)  HR__________ ° ° °This information is not intended to replace advice given to you by your health care provider. Make sure you discuss any questions you have with your health care provider. °Document Revised: 07/12/2019 Document Reviewed: 07/12/2019 °Elsevier Patient Education © 2021 [employer] Inc.  °

## 2021-05-20 NOTE — Progress Notes (Signed)
Cardiology Office Note:    Date:  05/20/2021   ID:  Lisa Maxwell, Lisa Maxwell January 20, 1946, MRN [id number]  PCP:  Nicoletta Dress, MD  Cardiologist:  Jenean Lindau, MD   Referring MD: Nicoletta Dress, MD    ASSESSMENT:    1. Coronary artery disease involving native coronary artery of native heart without angina pectoris   2. Essential hypertension   3. PAF (paroxysmal atrial fibrillation) ([place])   4. Diabetes mellitus due to underlying condition with unspecified complications ([place])    PLAN:    In order of problems listed above:  Coronary artery disease: Secondary prevention stressed with patient.  Importance of compliance with diet medication stressed and she vocalized understanding.  She ambulates to the best of her ability given her limitations. Essential hypertension: Blood pressure stable and diet was emphasized.  He is going to keep a track of her blood pressures and send them to me in a week.  Her blood pressure is borderline but she is asymptomatic.  I will see if I need to back off on some of her medications.  Recently had blood work has been in very regularly by primary care.  I reviewed last lab work. Paroxysmal atrial fibrillation: Now in sinus rhythm.  Not on anticoagulation because of fall potentially.  Recently she fell down and hit her head to the kitchen cabinet and told me that she had significant amount of blood and received stitches for the same. Diabetes mellitus and mixed dyslipidemia: On statin therapy.  Lipids reviewed from [facility] sheet.  They are followed closely by primary care. Obesity: Weight reduction stressed risks of obesity explained and she promises to do better. Patient will be seen in follow-up appointment in 6 months or earlier if the patient has any concerns    Medication Adjustments/Labs and Tests Ordered: Current medicines are reviewed at length with the patient today.  Concerns regarding medicines are outlined above.  No orders of the defined types  were placed in this encounter.  No orders of the defined types were placed in this encounter.    No chief complaint on file.    History of Present Illness:    Lisa Maxwell is a 76 y.o. female.  Patient has past medical history of coronary artery disease, paroxysmal atrial fibrillation, essential hypertension, mixed dyslipidemia, diabetes mellitus and obesity.  She leads a sedentary lifestyle.  She is not on anticoagulation because of unstable gait.  At the time of my evaluation, the patient is alert awake oriented and in no distress.  Past Medical History:  Diagnosis Date   CAD (coronary artery disease) 11/04/2016   Cancer ([place])    skin cancer   Cerebrovascular disease    Chronic atrial fibrillation ([place]) 09/28/2018   Chronic bilateral low back pain without sciatica 03/08/2018   Chronic kidney disease    Chronic obstructive lung disease ([place]) 09/28/2018   Chronic pain syndrome 03/08/2018   Chronic renal failure 09/28/2018   Chronic, continuous use of opioids 03/04/2021   Last Assessment & Plan:  Formatting of this note might be different from the original. Patient and I have discussed the hazardous effects of continued opiate pain medication usage. Risks and benefits of above medications including but not limited to possibility of respiratory depression, sedation, and even death were discussed with the patient who expressed an understanding.  Patient did not displ   CKD (chronic kidney disease) stage 3, GFR 30-59 ml/min (HCC)    Comprehensive diabetic foot examination, type 2  DM, encounter for [facility]) 04/29/2021   DDD (degenerative disc disease), lumbar 11/17/2018   Last Assessment & Plan:  Formatting of this note might be different from the original. See spinal stenosis plan   Diabetes ([place])    Diabetes mellitus due to underlying condition with unspecified complications ([place]) 09/06/2295   Dyshidrotic eczema 04/29/2021   Dysrhythmia    afib   Essential hypertension 11/04/2016   Facet arthritis  of lumbar region 03/08/2018   Facet joint disease 11/17/2018   Last Assessment & Plan:  Formatting of this note might be different from the original. See spinal stenosis plan   Foraminal stenosis of lumbar region 03/08/2018   Heart murmur    History of knee replacement, total, bilateral 11/17/2018   Last Assessment & Plan:  Formatting of this note might be different from the original. Have recommended to her several times if any further concerns in regards to the pain status post arthroplasty, she is to follow up with her surgeon.   Hypertension    Hypertensive disorder 09/28/2018   Increased body mass index 09/28/2018   Lumbar radiculopathy 03/08/2018   Lumbar spondylosis 03/20/2019   Mixed dyslipidemia 11/04/2016   Morbid obesity ([place]) 11/04/2016   Muscle pain 04/29/2020   Osteoarthritis of left knee    PAF (paroxysmal atrial fibrillation) ([place]) 04/30/2015   Pain management contract agreement 04/13/2019   Last Assessment & Plan:  Formatting of this note might be different from the original. Contract updated today.  UDS completed.  [place] controlled substance registry reviewed and is consistent with her regimen.   Peripheral vascular disease ([place]) 09/28/2018   Preop cardiovascular exam 05/01/2019   Renal insufficiency 04/30/2015   S/P insertion of spinal cord stimulator 07/13/2019   Last Assessment & Plan:  Formatting of this note might be different from the original. About 5 months status post insertion of spinal cord stimulator with excellent relief.  She continues to keep close follow-up with her representative and has been working to appropriately find best programs to get her adequate pain relief.   Sleep apnea    Smoker 09/28/2018   Spinal stenosis of lumbar region 03/20/2019   Spinal stenosis of lumbar region with neurogenic claudication 03/08/2018   Last Assessment & Plan:  Formatting of this note is different from the original. 76 year old female with chronic low back pain and bilateral,  right greater than left, L4-5 radicular pain, and claudication. She has substantial amount of multifactorial degenerative thoracic and lumbar spine pathology.  After being deemed an inappropriate open surgical candidate, and failing to respond to multiple in   Spondylolisthesis 03/20/2019   Spondylosis of lumbosacral region without myelopathy or radiculopathy 01/30/2019   Last Assessment & Plan:  Formatting of this note might be different from the original. See spinal stenosis plan   Type 2 diabetes mellitus ([place]) 09/28/2018    Past Surgical History:  Procedure Laterality Date   ABDOMINAL HYSTERECTOMY     JOINT REPLACEMENT Bilateral    knees   SPINAL CORD STIMULATOR INSERTION N/A 07/07/2019   Procedure: LUMBAR SPINAL CORD STIMULATOR INSERTION;  Surgeon: Clydell Hakim, MD;  Location: [facility];  Service: Neurosurgery;  Laterality: N/A;   total left knee      Current Medications: No outpatient medications have been marked as taking for the 05/20/21 encounter (Appointment) with Dorethia Jeanmarie, Reita Cliche, MD.     Allergies:   Patient has no known allergies.   Social History   Socioeconomic History   Marital status: Married  Spouse name: Not on file   Number of children: Not on file   Years of education: Not on file   Highest education level: Not on file  Occupational History   Not on file  Tobacco Use   Smoking status: Former    Types: Cigarettes    Quit date: 05/08/2019    Years since quitting: 2.0   Smokeless tobacco: Never  Vaping Use   Vaping Use: Never used  Substance and Sexual Activity   Alcohol use: No   Drug use: No   Sexual activity: Not on file  Other Topics Concern   Not on file  Social History Narrative   Not on file   Social Determinants of Health   Financial Resource Strain: Not on file  Food Insecurity: Not on file  Transportation Needs: Not on file  Physical Activity: Not on file  Stress: Not on file  Social Connections: Not on file     Family History: The  patient's family history includes Diabetes in her mother.  ROS:   Please see the history of present illness.    All other systems reviewed and are negative.  EKGs/Labs/Other Studies Reviewed:    The following studies were reviewed today: EKG reveals sinus rhythm and nonspecific ST changes   Recent Labs: No results found for requested labs within last 8760 hours.  Recent Lipid Panel No results found for: CHOL, TRIG, HDL, CHOLHDL, VLDL, LDLCALC, LDLDIRECT  Physical Exam:    VS:  There were no vitals taken for this visit.    Wt Readings from Last 3 Encounters:  11/11/20 168 lb 9.6 oz (76.5 kg)  04/15/20 179 lb 12.8 oz (81.6 kg)  07/07/19 194 lb 0.1 oz (88 kg)     GEN: Patient is in no acute distress HEENT: Normal NECK: No JVD; No carotid bruits LYMPHATICS: No lymphadenopathy CARDIAC: Hear sounds regular, 2/6 systolic murmur at the apex. RESPIRATORY:  Clear to auscultation without rales, wheezing or rhonchi  ABDOMEN: Soft, non-tender, non-distended MUSCULOSKELETAL:  No edema; No deformity  SKIN: Warm and dry NEUROLOGIC:  Alert and oriented x 3 PSYCHIATRIC:  Normal affect   Signed, Jenean Lindau, MD  05/20/2021 11:54 AM    [place]

## 2021-06-02 DIAGNOSIS — S46012A Strain of muscle(s) and tendon(s) of the rotator cuff of left shoulder, initial encounter: Secondary | ICD-10-CM | POA: Diagnosis not present

## 2021-06-02 DIAGNOSIS — M21822 Other specified acquired deformities of left upper arm: Secondary | ICD-10-CM | POA: Diagnosis not present

## 2021-06-02 DIAGNOSIS — M25512 Pain in left shoulder: Secondary | ICD-10-CM | POA: Diagnosis not present

## 2021-06-03 DIAGNOSIS — I10 Essential (primary) hypertension: Secondary | ICD-10-CM | POA: Diagnosis not present

## 2021-06-03 DIAGNOSIS — E1129 Type 2 diabetes mellitus with other diabetic kidney complication: Secondary | ICD-10-CM | POA: Diagnosis not present

## 2021-06-03 DIAGNOSIS — E782 Mixed hyperlipidemia: Secondary | ICD-10-CM | POA: Diagnosis not present

## 2021-06-05 DIAGNOSIS — M25512 Pain in left shoulder: Secondary | ICD-10-CM | POA: Diagnosis not present

## 2021-06-09 ENCOUNTER — Other Ambulatory Visit (HOSPITAL_COMMUNITY): Payer: Self-pay | Admitting: Specialist

## 2021-06-09 ENCOUNTER — Other Ambulatory Visit: Payer: Self-pay | Admitting: Specialist

## 2021-06-09 DIAGNOSIS — M25512 Pain in left shoulder: Secondary | ICD-10-CM

## 2021-06-17 DIAGNOSIS — Z23 Encounter for immunization: Secondary | ICD-10-CM | POA: Diagnosis not present

## 2021-06-17 DIAGNOSIS — R531 Weakness: Secondary | ICD-10-CM | POA: Diagnosis not present

## 2021-06-17 DIAGNOSIS — M25512 Pain in left shoulder: Secondary | ICD-10-CM | POA: Diagnosis not present

## 2021-06-20 ENCOUNTER — Ambulatory Visit (HOSPITAL_COMMUNITY)
Admission: RE | Admit: 2021-06-20 | Discharge: 2021-06-20 | Disposition: A | Discharge status: Home / Self Care | Payer: Medicare HMO | Source: Ambulatory Visit | Attending: Specialist | Admitting: Specialist

## 2021-06-20 ENCOUNTER — Other Ambulatory Visit: Payer: Self-pay

## 2021-06-20 ENCOUNTER — Encounter (HOSPITAL_COMMUNITY): Payer: Self-pay

## 2021-06-20 DIAGNOSIS — M25512 Pain in left shoulder: Secondary | ICD-10-CM

## 2021-06-30 DIAGNOSIS — N390 Urinary tract infection, site not specified: Secondary | ICD-10-CM | POA: Diagnosis not present

## 2021-06-30 DIAGNOSIS — R339 Retention of urine, unspecified: Secondary | ICD-10-CM | POA: Diagnosis not present

## 2021-07-03 DIAGNOSIS — R339 Retention of urine, unspecified: Secondary | ICD-10-CM | POA: Diagnosis not present

## 2021-07-03 DIAGNOSIS — N390 Urinary tract infection, site not specified: Secondary | ICD-10-CM | POA: Diagnosis not present

## 2021-07-04 ENCOUNTER — Ambulatory Visit (HOSPITAL_COMMUNITY)
Admission: RE | Admit: 2021-07-04 | Discharge: 2021-07-04 | Disposition: A | Discharge status: Home / Self Care | Payer: Medicare HMO | Source: Ambulatory Visit | Attending: Specialist | Admitting: Specialist

## 2021-07-04 DIAGNOSIS — M25512 Pain in left shoulder: Secondary | ICD-10-CM | POA: Diagnosis not present

## 2021-07-04 DIAGNOSIS — M25412 Effusion, left shoulder: Secondary | ICD-10-CM | POA: Diagnosis not present

## 2021-07-04 DIAGNOSIS — S46112A Strain of muscle, fascia and tendon of long head of biceps, left arm, initial encounter: Secondary | ICD-10-CM | POA: Diagnosis not present

## 2021-07-04 DIAGNOSIS — S46012A Strain of muscle(s) and tendon(s) of the rotator cuff of left shoulder, initial encounter: Secondary | ICD-10-CM | POA: Diagnosis not present

## 2021-07-10 DIAGNOSIS — E119 Type 2 diabetes mellitus without complications: Secondary | ICD-10-CM | POA: Diagnosis not present

## 2021-07-10 DIAGNOSIS — L301 Dyshidrosis [pompholyx]: Secondary | ICD-10-CM | POA: Diagnosis not present

## 2021-07-14 DIAGNOSIS — M25512 Pain in left shoulder: Secondary | ICD-10-CM | POA: Diagnosis not present

## 2021-07-15 DIAGNOSIS — M25512 Pain in left shoulder: Secondary | ICD-10-CM | POA: Diagnosis not present

## 2021-07-21 DIAGNOSIS — M25512 Pain in left shoulder: Secondary | ICD-10-CM | POA: Diagnosis not present

## 2021-07-23 DIAGNOSIS — M25512 Pain in left shoulder: Secondary | ICD-10-CM | POA: Diagnosis not present

## 2021-07-28 DIAGNOSIS — M25512 Pain in left shoulder: Secondary | ICD-10-CM | POA: Diagnosis not present

## 2021-07-30 DIAGNOSIS — M25512 Pain in left shoulder: Secondary | ICD-10-CM | POA: Diagnosis not present

## 2021-08-04 DIAGNOSIS — R339 Retention of urine, unspecified: Secondary | ICD-10-CM | POA: Diagnosis not present

## 2021-08-05 DIAGNOSIS — M25512 Pain in left shoulder: Secondary | ICD-10-CM | POA: Diagnosis not present

## 2021-08-06 DIAGNOSIS — N184 Chronic kidney disease, stage 4 (severe): Secondary | ICD-10-CM | POA: Diagnosis not present

## 2021-08-06 DIAGNOSIS — E1129 Type 2 diabetes mellitus with other diabetic kidney complication: Secondary | ICD-10-CM | POA: Diagnosis not present

## 2021-08-06 DIAGNOSIS — J449 Chronic obstructive pulmonary disease, unspecified: Secondary | ICD-10-CM | POA: Diagnosis not present

## 2021-08-06 DIAGNOSIS — E1122 Type 2 diabetes mellitus with diabetic chronic kidney disease: Secondary | ICD-10-CM | POA: Diagnosis not present

## 2021-08-06 DIAGNOSIS — D638 Anemia in other chronic diseases classified elsewhere: Secondary | ICD-10-CM | POA: Diagnosis not present

## 2021-08-06 DIAGNOSIS — E782 Mixed hyperlipidemia: Secondary | ICD-10-CM | POA: Diagnosis not present

## 2021-08-06 DIAGNOSIS — I1310 Hypertensive heart and chronic kidney disease without heart failure, with stage 1 through stage 4 chronic kidney disease, or unspecified chronic kidney disease: Secondary | ICD-10-CM | POA: Diagnosis not present

## 2021-08-06 DIAGNOSIS — N183 Chronic kidney disease, stage 3 unspecified: Secondary | ICD-10-CM | POA: Diagnosis not present

## 2021-08-06 DIAGNOSIS — E7800 Pure hypercholesterolemia, unspecified: Secondary | ICD-10-CM | POA: Diagnosis not present

## 2021-08-08 DIAGNOSIS — M25512 Pain in left shoulder: Secondary | ICD-10-CM | POA: Diagnosis not present

## 2021-08-11 DIAGNOSIS — R911 Solitary pulmonary nodule: Secondary | ICD-10-CM | POA: Diagnosis not present

## 2021-08-11 DIAGNOSIS — G4733 Obstructive sleep apnea (adult) (pediatric): Secondary | ICD-10-CM | POA: Diagnosis not present

## 2021-08-11 DIAGNOSIS — J4530 Mild persistent asthma, uncomplicated: Secondary | ICD-10-CM | POA: Diagnosis not present

## 2021-08-12 ENCOUNTER — Other Ambulatory Visit (HOSPITAL_COMMUNITY): Payer: Self-pay | Admitting: Family Medicine

## 2021-08-12 ENCOUNTER — Other Ambulatory Visit: Payer: Self-pay | Admitting: Family Medicine

## 2021-08-12 DIAGNOSIS — M48062 Spinal stenosis, lumbar region with neurogenic claudication: Secondary | ICD-10-CM | POA: Diagnosis not present

## 2021-08-12 DIAGNOSIS — M25512 Pain in left shoulder: Secondary | ICD-10-CM | POA: Diagnosis not present

## 2021-08-12 DIAGNOSIS — E661 Drug-induced obesity: Secondary | ICD-10-CM | POA: Diagnosis not present

## 2021-08-12 DIAGNOSIS — M5136 Other intervertebral disc degeneration, lumbar region: Secondary | ICD-10-CM | POA: Diagnosis not present

## 2021-08-12 DIAGNOSIS — Z6841 Body Mass Index (BMI) 40.0 and over, adult: Secondary | ICD-10-CM | POA: Diagnosis not present

## 2021-08-12 DIAGNOSIS — R339 Retention of urine, unspecified: Secondary | ICD-10-CM | POA: Diagnosis not present

## 2021-08-12 DIAGNOSIS — F1190 Opioid use, unspecified, uncomplicated: Secondary | ICD-10-CM | POA: Diagnosis not present

## 2021-08-12 DIAGNOSIS — Z9689 Presence of other specified functional implants: Secondary | ICD-10-CM | POA: Diagnosis not present

## 2021-08-12 DIAGNOSIS — M47817 Spondylosis without myelopathy or radiculopathy, lumbosacral region: Secondary | ICD-10-CM | POA: Diagnosis not present

## 2021-08-14 DIAGNOSIS — M25512 Pain in left shoulder: Secondary | ICD-10-CM | POA: Diagnosis not present

## 2021-08-15 ENCOUNTER — Ambulatory Visit (HOSPITAL_BASED_OUTPATIENT_CLINIC_OR_DEPARTMENT_OTHER)
Admission: RE | Admit: 2021-08-15 | Discharge: 2021-08-15 | Disposition: A | Discharge status: Home / Self Care | Payer: Medicare HMO | Source: Ambulatory Visit | Attending: Family Medicine | Admitting: Family Medicine

## 2021-08-15 DIAGNOSIS — M47816 Spondylosis without myelopathy or radiculopathy, lumbar region: Secondary | ICD-10-CM | POA: Insufficient documentation

## 2021-08-15 DIAGNOSIS — M4316 Spondylolisthesis, lumbar region: Secondary | ICD-10-CM | POA: Diagnosis not present

## 2021-08-15 DIAGNOSIS — M48062 Spinal stenosis, lumbar region with neurogenic claudication: Secondary | ICD-10-CM | POA: Insufficient documentation

## 2021-08-15 DIAGNOSIS — M545 Low back pain, unspecified: Secondary | ICD-10-CM | POA: Diagnosis not present

## 2021-08-19 DIAGNOSIS — M25512 Pain in left shoulder: Secondary | ICD-10-CM | POA: Diagnosis not present

## 2021-08-20 ENCOUNTER — Other Ambulatory Visit: Payer: Self-pay | Admitting: Neurosurgery

## 2021-08-20 DIAGNOSIS — M5416 Radiculopathy, lumbar region: Secondary | ICD-10-CM | POA: Diagnosis not present

## 2021-08-20 DIAGNOSIS — Z6834 Body mass index (BMI) 34.0-34.9, adult: Secondary | ICD-10-CM | POA: Diagnosis not present

## 2021-08-20 DIAGNOSIS — M48062 Spinal stenosis, lumbar region with neurogenic claudication: Secondary | ICD-10-CM | POA: Diagnosis not present

## 2021-08-20 DIAGNOSIS — G834 Cauda equina syndrome: Secondary | ICD-10-CM | POA: Diagnosis not present

## 2021-08-21 ENCOUNTER — Other Ambulatory Visit: Payer: Self-pay | Admitting: Neurosurgery

## 2021-08-21 ENCOUNTER — Telehealth: Payer: Self-pay

## 2021-08-21 DIAGNOSIS — M25512 Pain in left shoulder: Secondary | ICD-10-CM | POA: Diagnosis not present

## 2021-08-21 NOTE — Telephone Encounter (Signed)
    Name: Lisa Maxwell  DOB: 02/10/1946  MRN: [id number]  Primary Cardiologist: Jenean Lindau, MD   Preoperative team, please contact this patient and set up a phone call appointment for further preoperative risk assessment. Please obtain consent and complete medication review. Thank you for your help.  I confirm that guidance regarding antiplatelet and oral anticoagulation therapy has been completed and, if necessary, noted below. No antiplatelets/anticoags listed as needing to be held.    Charlie Pitter, PA-C 08/21/2021, 4:51 PM [place] [facility] [address] [place], [place] [address]

## 2021-08-21 NOTE — Telephone Encounter (Signed)
   Pre-operative Risk Assessment    Patient Name: Lisa Maxwell  DOB: [birth year] MRN: [id number]      Request for Surgical Clearance    Procedure:   Lumbar Fusion  Date of Surgery:  Clearance TBD                                 Surgeon:  Dr. Consuella Lose Surgeon's Group or Practice Name:  [facility] Phone number:  [phone] Fax number:  [phone] Attention Nikki   Type of Clearance Requested:   - Medical    Type of Anesthesia:  General    Additional requests/questions:    Gretchen Short   08/21/2021, 7:52 AM

## 2021-08-25 ENCOUNTER — Telehealth: Payer: Self-pay | Admitting: *Deleted

## 2021-08-25 NOTE — Telephone Encounter (Signed)
I s/w the pt and she has been scheduled for a tele pre op appt 08/26/21 @ 1:20. Pt tells me that her procedure is this Thursday 08/28/21. I informed the pt that we were not give a date for the procedure that it was TBD. Pt also tells me that her last dose of ASA was Saturday 08/23/21. Med rec and consent are done.     Patient Consent for Virtual Visit        Lisa Maxwell has provided verbal consent on 08/25/2021 for a virtual visit (video or telephone).   CONSENT FOR VIRTUAL VISIT FOR:  Lisa Maxwell  By participating in this virtual visit I agree to the following:  I hereby voluntarily request, consent and authorize [facility] and its employed or contracted physicians, physician assistants, nurse practitioners or other licensed health care professionals (the Practitioner), to provide me with telemedicine health care services (the "Services") as deemed necessary by the treating Practitioner. I acknowledge and consent to receive the Services by the Practitioner via telemedicine. I understand that the telemedicine visit will involve communicating with the Practitioner through live audiovisual communication technology and the disclosure of certain medical information by electronic transmission. I acknowledge that I have been given the opportunity to request an in-person assessment or other available alternative prior to the telemedicine visit and am voluntarily participating in the telemedicine visit.  I understand that I have the right to withhold or withdraw my consent to the use of telemedicine in the course of my care at any time, without affecting my right to future care or treatment, and that the Practitioner or I may terminate the telemedicine visit at any time. I understand that I have the right to inspect all information obtained and/or recorded in the course of the telemedicine visit and may receive copies of available information for a reasonable fee.  I understand that some of the  potential risks of receiving the Services via telemedicine include:  Delay or interruption in medical evaluation due to technological equipment failure or disruption; Information transmitted may not be sufficient (e.g. poor resolution of images) to allow for appropriate medical decision making by the Practitioner; and/or  In rare instances, security protocols could fail, causing a breach of personal health information.  Furthermore, I acknowledge that it is my responsibility to provide information about my medical history, conditions and care that is complete and accurate to the best of my ability. I acknowledge that Practitioner's advice, recommendations, and/or decision may be based on factors not within their control, such as incomplete or inaccurate data provided by me or distortions of diagnostic images or specimens that may result from electronic transmissions. I understand that the practice of medicine is not an exact science and that Practitioner makes no warranties or guarantees regarding treatment outcomes. I acknowledge that a copy of this consent can be made available to me via my patient portal ([facility]), or I can request a printed copy by calling the office of [facility].    I understand that my insurance will be billed for this visit.   I have read or had this consent read to me. I understand the contents of this consent, which adequately explains the benefits and risks of the Services being provided via telemedicine.  I have been provided ample opportunity to ask questions regarding this consent and the Services and have had my questions answered to my satisfaction. I give my informed consent for the services to be provided through the use  of telemedicine in my medical care

## 2021-08-25 NOTE — Telephone Encounter (Signed)
I s/w the pt and she has been scheduled for a tele pre op appt 08/26/21 @ 1:20. Pt tells me that her procedure is this Thursday 08/28/21. I informed the pt that we were not give a date for the procedure that it was TBD. Pt also tells me that her last dose of ASA was Saturday 08/23/21. Med rec and consent are done.

## 2021-08-25 NOTE — Progress Notes (Signed)
Surgical Instructions    Your procedure is scheduled on Thursday, May 25th, 2023.   Report to [facility] Main Entrance "A" at 10:30 A.M., then check in with the Admitting office.  Call this number if you have problems the morning of surgery:  [phone]   If you have any questions prior to your surgery date call [phone]: Open Monday-Friday 8am-4pm    Remember:  Do not eat or drink after midnight the night before your surgery    Take these medicines the morning of surgery with A SIP OF WATER:   bethanechol (URECHOLINE) carvedilol (COREG)  fluticasone furoate-vilanterol (BREO ELLIPTA) nitrofurantoin, macrocrystal-monohydrate, (MACROBID)  If needed:  HYDROcodone-acetaminophen (NORCO)  loratadine (CLARITIN)  nitroGLYCERIN (NITROSTAT)  PROAIR HFA   Please bring all inhalers with you the day of surgery.    Follow your surgeon's instructions on when to stop Aspirin.  If no instructions were given by your surgeon then you will need to call the office to get those instructions.     As of today, STOP taking any Aspirin (unless otherwise instructed by your surgeon) Aleve, Naproxen, Ibuprofen, Motrin, Advil, Goody's, BC's, all herbal medications, fish oil, and all vitamins.   WHAT DO I DO ABOUT MY DIABETES MEDICATION?   Do not take RYBELSUS the morning of surgery.   HOW TO MANAGE YOUR DIABETES BEFORE AND AFTER SURGERY  Why is it important to control my blood sugar before and after surgery? Improving blood sugar levels before and after surgery helps healing and can limit problems. A way of improving blood sugar control is eating a healthy diet by:  Eating less sugar and carbohydrates  Increasing activity/exercise  Talking with your doctor about reaching your blood sugar goals High blood sugars (greater than 180 mg/dL) can raise your risk of infections and slow your recovery, so you will need to focus on controlling your diabetes during the weeks before surgery. Make sure  that the doctor who takes care of your diabetes knows about your planned surgery including the date and location.  How do I manage my blood sugar before surgery? Check your blood sugar at least 4 times a day, starting 2 days before surgery, to make sure that the level is not too high or low.  Check your blood sugar the morning of your surgery when you wake up and every 2 hours until you get to the Short Stay unit.  If your blood sugar is less than 70 mg/dL, you will need to treat for low blood sugar: Do not take insulin. Treat a low blood sugar (less than 70 mg/dL) with  cup of clear juice (cranberry or apple), 4 glucose tablets, OR glucose gel. Recheck blood sugar in 15 minutes after treatment (to make sure it is greater than 70 mg/dL). If your blood sugar is not greater than 70 mg/dL on recheck, call [phone] for further instructions. Report your blood sugar to the short stay nurse when you get to Short Stay.  If you are admitted to the hospital after surgery: Your blood sugar will be checked by the staff and you will probably be given insulin after surgery (instead of oral diabetes medicines) to make sure you have good blood sugar levels. The goal for blood sugar control after surgery is 80-180 mg/dL.     The day of surgery:          Do not wear jewelry or makeup Do not wear lotions, powders, perfumes, or deodorant. Do not shave 48 hours prior to surgery.  Do not bring valuables to the hospital. Do not wear nail polish, gel polish, artificial nails, or any other type of covering on natural nails (fingers and toes) If you have artificial nails or gel coating that need to be removed by a nail salon, please have this removed prior to surgery. Artificial nails or gel coating may interfere with anesthesia's ability to adequately monitor your vital signs.  [facility] is not responsible for any belongings or valuables. .   Do NOT Smoke (Tobacco/Vaping)  24 hours prior to your  procedure  If you use a CPAP at night, you may bring your mask for your overnight stay.   Contacts, glasses, hearing aids, dentures or partials may not be worn into surgery, please bring cases for these belongings   For patients admitted to the hospital, discharge time will be determined by your treatment team.   Patients discharged the day of surgery will not be allowed to drive home, and someone needs to stay with them for 24 hours.   SURGICAL WAITING ROOM VISITATION Patients having surgery or a procedure in a hospital may have two support people. Children under the age of 40 must have an adult with them who is not the patient. They may stay in the waiting area during the procedure and may switch out with other visitors. If the patient needs to stay at the hospital during part of their recovery, the visitor guidelines for inpatient rooms apply.  Please refer to the [facility] website for the visitor guidelines for Inpatients (after your surgery is over and you are in a regular room).    Special instructions:    Oral Hygiene is also important to reduce your risk of infection.  Remember - BRUSH YOUR TEETH THE MORNING OF SURGERY WITH YOUR REGULAR TOOTHPASTE   [place]- Preparing For Surgery  Before surgery, you can play an important role. Because skin is not sterile, your skin needs to be as free of germs as possible. You can reduce the number of germs on your skin by washing with CHG (chlorahexidine gluconate) Soap before surgery.  CHG is an antiseptic cleaner which kills germs and bonds with the skin to continue killing germs even after washing.     Please do not use if you have an allergy to CHG or antibacterial soaps. If your skin becomes reddened/irritated stop using the CHG.  Do not shave (including legs and underarms) for at least 48 hours prior to first CHG shower. It is OK to shave your face.  Please follow these instructions carefully.     Shower the NIGHT BEFORE  SURGERY and the MORNING OF SURGERY with CHG Soap.   If you chose to wash your hair, wash your hair first as usual with your normal shampoo. After you shampoo, rinse your hair and body thoroughly to remove the shampoo.  Then [employer] and genitals (private parts) with your normal soap and rinse thoroughly to remove soap.  After that Use CHG Soap as you would any other liquid soap. You can apply CHG directly to the skin and wash gently with a scrungie or a clean washcloth.   Apply the CHG Soap to your body ONLY FROM THE NECK DOWN.  Do not use on open wounds or open sores. Avoid contact with your eyes, ears, mouth and genitals (private parts). Wash Face and genitals (private parts)  with your normal soap.   Wash thoroughly, paying special attention to the area where your surgery will be performed.  Thoroughly  rinse your body with warm water from the neck down.  DO NOT shower/wash with your normal soap after using and rinsing off the CHG Soap.  Pat yourself dry with a CLEAN TOWEL.  Wear CLEAN PAJAMAS to bed the night before surgery  Place CLEAN SHEETS on your bed the night before your surgery  DO NOT SLEEP WITH PETS.   Day of Surgery:  Take a shower with CHG soap. Wear Clean/Comfortable clothing the morning of surgery Do not apply any deodorants/lotions.   Remember to brush your teeth WITH YOUR REGULAR TOOTHPASTE.    If you received a COVID test during your pre-op visit, it is requested that you wear a mask when out in public, stay away from anyone that may not be feeling well, and notify your surgeon if you develop symptoms. If you have been in contact with anyone that has tested positive in the last 10 days, please notify your surgeon.    Please read over the following fact sheets that you were given.

## 2021-08-26 ENCOUNTER — Ambulatory Visit (INDEPENDENT_AMBULATORY_CARE_PROVIDER_SITE_OTHER): Payer: Medicare HMO | Admitting: Nurse Practitioner

## 2021-08-26 ENCOUNTER — Other Ambulatory Visit: Payer: Self-pay

## 2021-08-26 ENCOUNTER — Encounter (HOSPITAL_COMMUNITY): Payer: Self-pay

## 2021-08-26 ENCOUNTER — Encounter: Payer: Self-pay | Admitting: Nurse Practitioner

## 2021-08-26 ENCOUNTER — Encounter (HOSPITAL_COMMUNITY)
Admission: RE | Admit: 2021-08-26 | Discharge: 2021-08-26 | Disposition: A | Discharge status: Home / Self Care | Payer: Medicare HMO | Source: Ambulatory Visit | Attending: Neurosurgery | Admitting: Neurosurgery

## 2021-08-26 VITALS — BP 119/60 | HR 86 | Temp 97.7°F | Resp 18 | Ht <= 58 in | Wt 153.0 lb

## 2021-08-26 DIAGNOSIS — N200 Calculus of kidney: Secondary | ICD-10-CM | POA: Diagnosis not present

## 2021-08-26 DIAGNOSIS — N183 Chronic kidney disease, stage 3 unspecified: Secondary | ICD-10-CM | POA: Diagnosis not present

## 2021-08-26 DIAGNOSIS — Z01818 Encounter for other preprocedural examination: Secondary | ICD-10-CM

## 2021-08-26 DIAGNOSIS — E1122 Type 2 diabetes mellitus with diabetic chronic kidney disease: Secondary | ICD-10-CM | POA: Diagnosis not present

## 2021-08-26 DIAGNOSIS — G834 Cauda equina syndrome: Secondary | ICD-10-CM | POA: Diagnosis not present

## 2021-08-26 DIAGNOSIS — I129 Hypertensive chronic kidney disease with stage 1 through stage 4 chronic kidney disease, or unspecified chronic kidney disease: Secondary | ICD-10-CM | POA: Diagnosis not present

## 2021-08-26 DIAGNOSIS — Z85828 Personal history of other malignant neoplasm of skin: Secondary | ICD-10-CM | POA: Insufficient documentation

## 2021-08-26 DIAGNOSIS — Z01812 Encounter for preprocedural laboratory examination: Secondary | ICD-10-CM | POA: Insufficient documentation

## 2021-08-26 DIAGNOSIS — Z87891 Personal history of nicotine dependence: Secondary | ICD-10-CM | POA: Diagnosis not present

## 2021-08-26 DIAGNOSIS — I480 Paroxysmal atrial fibrillation: Secondary | ICD-10-CM | POA: Insufficient documentation

## 2021-08-26 DIAGNOSIS — I2510 Atherosclerotic heart disease of native coronary artery without angina pectoris: Secondary | ICD-10-CM | POA: Diagnosis not present

## 2021-08-26 DIAGNOSIS — Z01810 Encounter for preprocedural cardiovascular examination: Secondary | ICD-10-CM | POA: Diagnosis not present

## 2021-08-26 DIAGNOSIS — E1151 Type 2 diabetes mellitus with diabetic peripheral angiopathy without gangrene: Secondary | ICD-10-CM | POA: Diagnosis not present

## 2021-08-26 DIAGNOSIS — J449 Chronic obstructive pulmonary disease, unspecified: Secondary | ICD-10-CM | POA: Diagnosis not present

## 2021-08-26 DIAGNOSIS — R339 Retention of urine, unspecified: Secondary | ICD-10-CM | POA: Diagnosis not present

## 2021-08-26 LAB — TYPE AND SCREEN
ABO/RH(D): A POS
Antibody Screen: NEGATIVE

## 2021-08-26 LAB — CBC
HCT: 43.1 % (ref 36.0–46.0)
Hemoglobin: 14.1 g/dL (ref 12.0–15.0)
MCH: 31.3 pg (ref 26.0–34.0)
MCHC: 32.7 g/dL (ref 30.0–36.0)
MCV: 95.8 fL (ref 80.0–100.0)
Platelets: 208 10*3/uL (ref 150–400)
RBC: 4.5 MIL/uL (ref 3.87–5.11)
RDW: 13.9 % (ref 11.5–15.5)
WBC: 6.9 10*3/uL (ref 4.0–10.5)
nRBC: 0 % (ref 0.0–0.2)

## 2021-08-26 LAB — BASIC METABOLIC PANEL
Anion gap: 6 (ref 5–15)
BUN: 22 mg/dL (ref 8–23)
CO2: 28 mmol/L (ref 22–32)
Calcium: 10.3 mg/dL (ref 8.9–10.3)
Chloride: 105 mmol/L (ref 98–111)
Creatinine, Ser: 1.67 mg/dL — ABNORMAL HIGH (ref 0.44–1.00)
GFR, Estimated: 32 mL/min — ABNORMAL LOW (ref >60)
Glucose, Bld: 105 mg/dL — ABNORMAL HIGH (ref 70–99)
Potassium: 4.5 mmol/L (ref 3.5–5.1)
Sodium: 139 mmol/L (ref 135–145)

## 2021-08-26 LAB — HEMOGLOBIN A1C
Hgb A1c MFr Bld: 6.5 % — ABNORMAL HIGH (ref 4.8–5.6)
Mean Plasma Glucose: 139.85 mg/dL

## 2021-08-26 LAB — SURGICAL PCR SCREEN
MRSA, PCR: NEGATIVE
Staphylococcus aureus: NEGATIVE

## 2021-08-26 LAB — GLUCOSE, CAPILLARY: Glucose-Capillary: 129 mg/dL — ABNORMAL HIGH (ref 70–99)

## 2021-08-26 NOTE — Progress Notes (Signed)
Virtual Visit via Telephone Note   Because of Lisa Maxwell's co-morbid illnesses, she is at least at moderate risk for complications without adequate follow up.  This format is felt to be most appropriate for this patient at this time.  The patient did not have access to video technology/had technical difficulties with video requiring transitioning to audio format only (telephone).  All issues noted in this document were discussed and addressed.  No physical exam could be performed with this format.  Please refer to the patient's chart for her consent to telehealth for [facility].  Evaluation Performed:  Preoperative cardiovascular risk assessment _____________   Date:  08/26/2021   Patient ID:  Lisa Maxwell, Lisa Maxwell 1945/08/16, MRN [id number] Patient Location:  Home Provider location:   Office  Primary Care Provider:  Nicoletta Dress, MD Primary Cardiologist:  Jenean Lindau, MD  Chief Complaint / Patient Profile   76 y.o. y/o female with a h/o CAD, chronic atrial fibrillation who is not on chronic anticoagulation due to frequent falls, hypertension, mixed dyslipidemia, who is pending lumbar fusion and presents today for telephonic preoperative cardiovascular risk assessment.  Past Medical History    Past Medical History:  Diagnosis Date   CAD (coronary artery disease) 11/04/2016   Cancer ([place])    skin cancer   Cerebrovascular disease    Chronic atrial fibrillation ([place]) 09/28/2018   Chronic bilateral low back pain without sciatica 03/08/2018   Chronic kidney disease    Chronic obstructive lung disease ([place]) 09/28/2018   Chronic pain syndrome 03/08/2018   Chronic renal failure 09/28/2018   Chronic, continuous use of opioids 03/04/2021   Last Assessment & Plan:  Formatting of this note might be different from the original. Patient and I have discussed the hazardous effects of continued opiate pain medication usage. Risks and benefits of above medications including but not  limited to possibility of respiratory depression, sedation, and even death were discussed with the patient who expressed an understanding.  Patient did not displ   CKD (chronic kidney disease) stage 3, GFR 30-59 ml/min (HCC)    Comprehensive diabetic foot examination, type 2 DM, encounter for ([place]) 04/29/2021   DDD (degenerative disc disease), lumbar 11/17/2018   Last Assessment & Plan:  Formatting of this note might be different from the original. See spinal stenosis plan   Diabetes ([place])    Diabetes mellitus due to underlying condition with unspecified complications ([place]) 05/14/4130   Dyshidrotic eczema 04/29/2021   Dysrhythmia    afib   Essential hypertension 11/04/2016   Facet arthritis of lumbar region 03/08/2018   Facet joint disease 11/17/2018   Last Assessment & Plan:  Formatting of this note might be different from the original. See spinal stenosis plan   Foraminal stenosis of lumbar region 03/08/2018   Heart murmur    History of knee replacement, total, bilateral 11/17/2018   Last Assessment & Plan:  Formatting of this note might be different from the original. Have recommended to her several times if any further concerns in regards to the pain status post arthroplasty, she is to follow up with her surgeon.   Hypertension    Hypertensive disorder 09/28/2018   Increased body mass index 09/28/2018   Lumbar radiculopathy 03/08/2018   Lumbar spondylosis 03/20/2019   Mixed dyslipidemia 11/04/2016   Morbid obesity ([place]) 11/04/2016   Muscle pain 04/29/2020   Osteoarthritis of left knee    PAF (paroxysmal atrial fibrillation) ([place]) 04/30/2015   Pain management contract agreement  04/13/2019   Last Assessment & Plan:  Formatting of this note might be different from the original. Contract updated today.  UDS completed.  [place] controlled substance registry reviewed and is consistent with her regimen.   Peripheral vascular disease ([place]) 09/28/2018   Preop cardiovascular exam 05/01/2019   Renal  insufficiency 04/30/2015   S/P insertion of spinal cord stimulator 07/13/2019   Last Assessment & Plan:  Formatting of this note might be different from the original. About 5 months status post insertion of spinal cord stimulator with excellent relief.  She continues to keep close follow-up with her representative and has been working to appropriately find best programs to get her adequate pain relief.   Sleep apnea    Smoker 09/28/2018   Spinal stenosis of lumbar region 03/20/2019   Spinal stenosis of lumbar region with neurogenic claudication 03/08/2018   Last Assessment & Plan:  Formatting of this note is different from the original. 76 year old female with chronic low back pain and bilateral, right greater than left, L4-5 radicular pain, and claudication. She has substantial amount of multifactorial degenerative thoracic and lumbar spine pathology.  After being deemed an inappropriate open surgical candidate, and failing to respond to multiple in   Spondylolisthesis 03/20/2019   Spondylosis of lumbosacral region without myelopathy or radiculopathy 01/30/2019   Last Assessment & Plan:  Formatting of this note might be different from the original. See spinal stenosis plan   Type 2 diabetes mellitus ([place]) 09/28/2018   Past Surgical History:  Procedure Laterality Date   ABDOMINAL HYSTERECTOMY     JOINT REPLACEMENT Bilateral    knees   SPINAL CORD STIMULATOR INSERTION N/A 07/07/2019   Procedure: LUMBAR SPINAL CORD STIMULATOR INSERTION;  Surgeon: Clydell Hakim, MD;  Location: [facility];  Service: Neurosurgery;  Laterality: N/A;   total left knee      Allergies  No Known Allergies  History of Present Illness    Lisa Maxwell is a 76 y.o. female who presents via audio/video conferencing for a telehealth visit today.  Pt was last seen in cardiology clinic on 11/11/20 by Dr. Geraldo Pitter.  At that time Lisa Maxwell was doing well.  The patient is now pending procedure as outlined above. Since her last visit,  she denies chest pain, shortness of breath, lower extremity edema, fatigue, palpitations, melena, hematuria, hemoptysis, diaphoresis, weakness, presyncope, syncope, orthopnea, and PND.   Home Medications    Prior to Admission medications   Medication Sig Start Date End Date Taking? Authorizing Provider  aspirin 81 MG tablet Take 81 mg by mouth daily.    [provider]  bethanechol (URECHOLINE) 50 MG tablet Take 50-100 mg by mouth See admin instructions. Take 50 mg by mouth in the morning and 25 mg at night 08/12/21   [provider]  carvedilol (COREG) 12.5 MG tablet Take 12.5 mg by mouth 2 (two) times daily. 04/25/19   [provider]  fluticasone furoate-vilanterol (BREO ELLIPTA) 100-25 MCG/ACT AEPB Inhale 1 puff into the lungs daily.    [provider]  furosemide (LASIX) 40 MG tablet Take 40 mg by mouth daily.  02/19/18   [provider]  gabapentin (NEURONTIN) 600 MG tablet Take 600 mg by mouth at bedtime. 04/06/21   [provider]  HYDROcodone-acetaminophen (NORCO) 10-325 MG tablet Take 0.5 tablets by mouth every 6 (six) hours as needed for severe pain.    [provider]  loratadine (CLARITIN) 10 MG tablet Take 10 mg by mouth daily.  [provider]  losartan (COZAAR) 50 MG tablet Take 50 mg by mouth daily.    [provider]  lovastatin (MEVACOR) 40 MG tablet Take 40 mg by mouth at bedtime.     [provider]  Multiple Vitamin (MULTIVITAMIN WITH MINERALS) TABS tablet Take 1 tablet by mouth daily.    [provider]  nitrofurantoin, macrocrystal-monohydrate, (MACROBID) 100 MG capsule Take 100 mg by mouth daily. 07/30/21   [provider]  nitroGLYCERIN (NITROSTAT) 0.4 MG SL tablet Place 0.4 mg under the tongue every 5 (five) minutes as needed for chest pain.    [provider]  PROAIR HFA 108 [phone] Base) MCG/ACT inhaler Inhale 1-2 puffs into the lungs every 4 (four) hours as  needed for wheezing or shortness of breath.  10/12/16   [provider]  RYBELSUS 3 MG TABS Take 3 mg by mouth daily. 05/12/21   [provider]  spironolactone (ALDACTONE) 50 MG tablet Take 50 mg by mouth daily. 01/04/21   [provider]  tamsulosin (FLOMAX) 0.4 MG CAPS capsule Take 0.4 mg by mouth daily after supper. 06/10/19   [provider]  vitamin B-12 (CYANOCOBALAMIN) 500 MCG tablet Take 500 mcg by mouth daily.     [provider]    Physical Exam    Vital Signs:  Lisa Maxwell does not have vital signs available for review today.  Given telephonic nature of communication, physical exam is limited. AAOx3. NAD. Normal affect.  Speech and respirations are unlabored.  Accessory Clinical Findings    None  Assessment & Plan    1.  Preoperative Cardiovascular Risk Assessment: She is doing well from a cardiac perspective and may proceed to procedure without further cardiac testing. According to the Revised Cardiac Risk Index (RCRI), her Perioperative Risk of Major Cardiac Event is (%): 0.4, Her Functional Capacity in METs is: 4.4 according to the [facility] Activity Status Index (DASI).   A copy of this note will be routed to requesting surgeon.  Time:   Today, I have spent 10 minutes with the patient with telehealth technology discussing medical history, symptoms, and management plan.     Emmaline Life, NP-C    08/26/2021, 1:36 PM [place] [address], [address] Office [phone] Fax [phone]

## 2021-08-26 NOTE — Progress Notes (Signed)
PCP - Nelda Bucks, MD Cardiologist - Jyl Heinz, MD  PPM/ICD - denies Device Orders - n/a Rep Notified - n/a  Chest x-ray - n/a EKG - 05/20/2021 Stress Test - 05/08/2019 ECHO - 05/14/2015 - results requested  Cardiac Cath - denies  Sleep Study - yes, positive for OSA CPAP - yes - 13  Fasting Blood Sugar - 115 - 147 Checks Blood Sugar once a day CBG today - 129 A1C - done on 08/26/2021  Blood Thinner Instructions: n/a  Aspirin Instructions - last dose - 08/23/2021 per patient  Patient was instructed: As of today, STOP taking any Aspirin (unless otherwise instructed by your surgeon) Aleve, Naproxen, Ibuprofen, Motrin, Advil, Goody's, BC's, all herbal medications, fish oil, and all vitamins.  ERAS Protcol - n/a  COVID TEST- n/a  Anesthesia review: yes - cardiac clearance on 08/26/21; echo results requested  Patient denies shortness of breath, fever, cough and chest pain at PAT appointment   All instructions explained to the patient, with a verbal understanding of the material. Patient agrees to go over the instructions while at home for a better understanding. Patient also instructed to self quarantine after being tested for COVID-19. The opportunity to ask questions was provided.

## 2021-08-27 NOTE — Progress Notes (Signed)
Lisa Chart Review:  Case: [id number] Date/Time: 08/28/21 1215   Procedure: PLIF L23 - 3C   Lisa type: General   Pre-op diagnosis: CAUDA EQUINA SYNDROME   Location: [facility] OR ROOM 11 / [place] [facility]   Surgeons: Consuella Lose, Lisa       DISCUSSION: Patient is a 76 year old female scheduled for the above procedure.  History includes former smoker (quit 05/08/19), COPD, HTN, CAD, afib/PAF, murmur ("Unremarkable" echo in 2017), DM2, PVD, cerebrovascular disease, CKD (stage III), anemia,  OSA (uses CPAP), skin cancer, obesity, osteoarthritis (right TKA 05/08/04), spinal surgery (spinal cord stimulator 07/07/19).   Preoperative telephonic CV assessment on 08/26/21 by Christen Bame, NP, "Preoperative Cardiovascular Risk Assessment: She is doing well from a cardiac perspective and may proceed to procedure without further cardiac testing. According to the Revised Cardiac Risk Index (RCRI), her Perioperative Risk of Major Cardiac Event is (%): 0.4, Her Functional Capacity in METs is: 4.4 according to the Duke Activity Status Index (DASI)."  Reported last aspirin 08/23/2021.  Lisa team to evaluate on the day of surgery.   VS: BP 119/60   Pulse 86   Temp 36.5 C (Oral)   Resp 18   Ht '4\' 8"'$  (1.422 m)   Wt 69.4 kg   SpO2 98%   BMI 34.30 kg/m    PROVIDERS: Nicoletta Dress, Lisa is PCP  Jyl Heinz, Lisa is cardiologist Elmarie Shiley, Lisa is nephrologist Gardiner Rhyme, Lisa is pulmonologist   LABS: Labs reviewed: Acceptable for surgery.  Creatinine 1.67, consistent with labs from 2 years ago and known CKD history. (all labs ordered are listed, but only abnormal results are displayed)  Labs Reviewed  GLUCOSE, CAPILLARY - Abnormal; Notable for the following components:      Result Value   Glucose-Capillary 129 (*)    All other components within normal limits  BASIC METABOLIC PANEL - Abnormal; Notable for the following components:   Glucose, Bld 105 (*)    Creatinine, Ser 1.67 (*)     GFR, Estimated 32 (*)    All other components within normal limits  HEMOGLOBIN A1C - Abnormal; Notable for the following components:   Hgb A1c MFr Bld 6.5 (*)    All other components within normal limits  SURGICAL PCR SCREEN  CBC  TYPE AND SCREEN     IMAGES: MRI L-spine 08/15/21: IMPRESSION: 1. Signal abnormality in the T11 and T12 vertebral bodies with mild associated endplate irregularity is favored to be degenerative in nature; however, discitis/osteomyelitis could have a similar appearance. Recommend correlation with symptoms and inflammatory markers. A disc bulge and bilateral facet arthropathy at this level result in moderate to severe spinal canal stenosis and severe right worse than left neural foraminal stenosis, significantly progressed since 2020. 2. Multifactorial severe spinal canal and bilateral neural foraminal stenosis at L2-L3, progressed since 2020. 3. Advanced facet arthropathy at L3-L4 and L4-L5 with associated effusions. Moderate spinal canal and subarticular zone narrowing and moderate to severe left neural foraminal stenosis at L3-L4 is progressed since 2020, and mild spinal canal and subarticular zone narrowing and severe right neural foraminal stenosis at L4-L5 is similar to 2020. 4. Dextrocurvature centered at the thoracolumbar junction.  CT Chest (low dose for lung cancer screening) 04/23/21 ([facility]): IMPRESSION: 1. Lung-RADS 2, benign appearance or behavior. Continue annual screening with low-dose chest CT without contrast in 12 months. 2. Coronary artery calcifications. 3. Aortic Atherosclerosis (ICD10-I70.0).    EKG: 05/20/21 ([facility]): NSR, RBBB.   CV: Nuclear stress 05/08/19: Normal  resting and stress perfusion. No ischemia or infarction EF 73%  She did have an echo in 05/14/15 that Dr. Geraldo Pitter commented was "Unremarkable" by 05/16/15 telephone encounter; however results are not available in  [place] and were not received  when requested from [facility] in 2021, citing Dr. Geraldo Pitter was not longer with their health system.    Past Medical History:  Diagnosis Date   CAD (coronary artery disease) 11/04/2016   Cancer ([place])    skin cancer   Cerebrovascular disease    Chronic atrial fibrillation ([place]) 09/28/2018   Chronic bilateral low back pain without sciatica 03/08/2018   Chronic kidney disease    Chronic obstructive lung disease ([place]) 09/28/2018   Chronic pain syndrome 03/08/2018   Chronic renal failure 09/28/2018   Chronic, continuous use of opioids 03/04/2021   Last Assessment & Plan:  Formatting of this note might be different from the original. Patient and I have discussed the hazardous effects of continued opiate pain medication usage. Risks and benefits of above medications including but not limited to possibility of respiratory depression, sedation, and even death were discussed with the patient who expressed an understanding.  Patient did not displ   CKD (chronic kidney disease) stage 3, GFR 30-59 ml/min (HCC)    Comprehensive diabetic foot examination, type 2 DM, encounter for ([place]) 04/29/2021   DDD (degenerative disc disease), lumbar 11/17/2018   Last Assessment & Plan:  Formatting of this note might be different from the original. See spinal stenosis plan   Diabetes ([place])    Diabetes mellitus due to underlying condition with unspecified complications ([place]) 11/09/275   Dyshidrotic eczema 04/29/2021   Dysrhythmia    afib   Essential hypertension 11/04/2016   Facet arthritis of lumbar region 03/08/2018   Facet joint disease 11/17/2018   Last Assessment & Plan:  Formatting of this note might be different from the original. See spinal stenosis plan   Foraminal stenosis of lumbar region 03/08/2018   Heart murmur    History of knee replacement, total, bilateral 11/17/2018   Last Assessment & Plan:  Formatting of this note might be different from the original. Have recommended to her several times if any further concerns in  regards to the pain status post arthroplasty, she is to follow up with her surgeon.   Hypertension    Hypertensive disorder 09/28/2018   Increased body mass index 09/28/2018   Lumbar radiculopathy 03/08/2018   Lumbar spondylosis 03/20/2019   Mixed dyslipidemia 11/04/2016   Morbid obesity ([place]) 11/04/2016   Muscle pain 04/29/2020   Osteoarthritis of left knee    PAF (paroxysmal atrial fibrillation) ([place]) 04/30/2015   Pain management contract agreement 04/13/2019   Last Assessment & Plan:  Formatting of this note might be different from the original. Contract updated today.  UDS completed.  [place] controlled substance registry reviewed and is consistent with her regimen.   Peripheral vascular disease ([place]) 09/28/2018   Preop cardiovascular exam 05/01/2019   Renal insufficiency 04/30/2015   S/P insertion of spinal cord stimulator 07/13/2019   Last Assessment & Plan:  Formatting of this note might be different from the original. About 5 months status post insertion of spinal cord stimulator with excellent relief.  She continues to keep close follow-up with her representative and has been working to appropriately find best programs to get her adequate pain relief.   Sleep apnea    Smoker 09/28/2018   Spinal stenosis of lumbar region 03/20/2019   Spinal stenosis of lumbar region  with neurogenic claudication 03/08/2018   Last Assessment & Plan:  Formatting of this note is different from the original. 76 year old female with chronic low back pain and bilateral, right greater than left, L4-5 radicular pain, and claudication. She has substantial amount of multifactorial degenerative thoracic and lumbar spine pathology.  After being deemed an inappropriate open surgical candidate, and failing to respond to multiple in   Spondylolisthesis 03/20/2019   Spondylosis of lumbosacral region without myelopathy or radiculopathy 01/30/2019   Last Assessment & Plan:  Formatting of this note might be different from the  original. See spinal stenosis plan   Type 2 diabetes mellitus ([place]) 09/28/2018    Past Surgical History:  Procedure Laterality Date   ABDOMINAL HYSTERECTOMY     BACK SURGERY     EYE SURGERY     bilateral cataracts   JOINT REPLACEMENT Bilateral    knees   SPINAL CORD STIMULATOR INSERTION N/A 07/07/2019   Procedure: LUMBAR SPINAL CORD STIMULATOR INSERTION;  Surgeon: Clydell Hakim, Lisa;  Location: [place];  Service: Neurosurgery;  Laterality: N/A;   total left knee      MEDICATIONS:  aspirin 81 MG tablet   bethanechol (URECHOLINE) 50 MG tablet   carvedilol (COREG) 12.5 MG tablet   fluticasone furoate-vilanterol (BREO ELLIPTA) 100-25 MCG/ACT AEPB   furosemide (LASIX) 40 MG tablet   gabapentin (NEURONTIN) 600 MG tablet   HYDROcodone-acetaminophen (NORCO) 10-325 MG tablet   loratadine (CLARITIN) 10 MG tablet   losartan (COZAAR) 50 MG tablet   lovastatin (MEVACOR) 40 MG tablet   Multiple Vitamin (MULTIVITAMIN WITH MINERALS) TABS tablet   nitrofurantoin, macrocrystal-monohydrate, (MACROBID) 100 MG capsule   nitroGLYCERIN (NITROSTAT) 0.4 MG SL tablet   PROAIR HFA 108 (90 Base) MCG/ACT inhaler   RYBELSUS 3 MG TABS   spironolactone (ALDACTONE) 50 MG tablet   tamsulosin (FLOMAX) 0.4 MG CAPS capsule   vitamin B-12 (CYANOCOBALAMIN) 500 MCG tablet   No current facility-administered medications for this encounter.    Myra Gianotti, PA-C Surgical Short Stay/Anesthesiology [facility] Phone [phone] [facility] Phone [phone] 08/27/2021 12:07 PM

## 2021-08-27 NOTE — Anesthesia Preprocedure Evaluation (Signed)
Anesthesia Evaluation  Patient identified by MRN, date of birth, ID band Patient awake    Reviewed: Allergy & Precautions, NPO status , Patient's Chart, lab work & pertinent test results, reviewed documented beta blocker date and time   History of Anesthesia Complications Negative for: history of anesthetic complications  Airway Mallampati: I  TM Distance: >3 FB Neck ROM: Full    Dental  (+) Edentulous Upper, Edentulous Lower   Pulmonary sleep apnea and Continuous Positive Airway Pressure Ventilation , COPD,  COPD inhaler, former smoker,    Pulmonary exam normal        Cardiovascular hypertension, Pt. on medications and Pt. on home beta blockers (-) angina+ CAD and + Peripheral Vascular Disease  + dysrhythmias Atrial Fibrillation  Rhythm:Regular Rate:Normal  '21 Stress: Normal resting and stress perfusion. No ischemia or infarction EF 73%    Neuro/Psych negative neurological ROS     GI/Hepatic negative GI ROS, Neg liver ROS,   Endo/Other  diabetes (glu 145), Oral Hypoglycemic Agents  Renal/GU Renal InsufficiencyRenal disease     Musculoskeletal   Abdominal (+) + obese,   Peds  Hematology   Anesthesia Other Findings   Reproductive/Obstetrics                           Anesthesia Physical Anesthesia Plan  ASA: 3  Anesthesia Plan: General   Post-op Pain Management: Tylenol PO (pre-op)*   Induction: Intravenous  PONV Risk Score and Plan: 3 and Ondansetron, Dexamethasone and Treatment may vary due to age or medical condition  Airway Management Planned: Oral ETT  Additional Equipment: None  Intra-op Plan:   Post-operative Plan: Extubation in OR  Informed Consent: I have reviewed the patients History and Physical, chart, labs and discussed the procedure including the risks, benefits and alternatives for the proposed anesthesia with the patient or authorized representative who has  indicated his/her understanding and acceptance.     Dental advisory given  Plan Discussed with: CRNA and Surgeon  Anesthesia Plan Comments: (PAT note written 08/27/2021 by Myra Gianotti, PA-C. )      Anesthesia Quick Evaluation

## 2021-08-28 ENCOUNTER — Ambulatory Visit (HOSPITAL_COMMUNITY): Payer: Medicare HMO

## 2021-08-28 ENCOUNTER — Ambulatory Visit (HOSPITAL_BASED_OUTPATIENT_CLINIC_OR_DEPARTMENT_OTHER): Payer: Medicare HMO | Admitting: Anesthesiology

## 2021-08-28 ENCOUNTER — Other Ambulatory Visit (HOSPITAL_COMMUNITY): Payer: Self-pay | Admitting: Neurosurgery

## 2021-08-28 ENCOUNTER — Observation Stay (HOSPITAL_COMMUNITY)
Admission: RE | Admit: 2021-08-28 | Discharge: 2021-08-29 | Disposition: A | Discharge status: Home / Self Care | Payer: Medicare HMO | Attending: Neurosurgery | Admitting: Neurosurgery

## 2021-08-28 ENCOUNTER — Other Ambulatory Visit: Payer: Self-pay

## 2021-08-28 ENCOUNTER — Ambulatory Visit (HOSPITAL_COMMUNITY): Payer: Medicare HMO | Admitting: Vascular Surgery

## 2021-08-28 ENCOUNTER — Encounter (HOSPITAL_COMMUNITY): Payer: Self-pay | Admitting: Neurosurgery

## 2021-08-28 ENCOUNTER — Encounter (HOSPITAL_COMMUNITY)
Admission: RE | Disposition: A | Discharge status: Home / Self Care | Payer: Self-pay | Source: Home / Self Care | Attending: Neurosurgery

## 2021-08-28 DIAGNOSIS — Z96653 Presence of artificial knee joint, bilateral: Secondary | ICD-10-CM | POA: Diagnosis not present

## 2021-08-28 DIAGNOSIS — Z87891 Personal history of nicotine dependence: Secondary | ICD-10-CM | POA: Diagnosis not present

## 2021-08-28 DIAGNOSIS — M5136 Other intervertebral disc degeneration, lumbar region: Secondary | ICD-10-CM | POA: Diagnosis not present

## 2021-08-28 DIAGNOSIS — N183 Chronic kidney disease, stage 3 unspecified: Secondary | ICD-10-CM | POA: Diagnosis not present

## 2021-08-28 DIAGNOSIS — E1122 Type 2 diabetes mellitus with diabetic chronic kidney disease: Secondary | ICD-10-CM | POA: Insufficient documentation

## 2021-08-28 DIAGNOSIS — Z01818 Encounter for other preprocedural examination: Secondary | ICD-10-CM

## 2021-08-28 DIAGNOSIS — M4726 Other spondylosis with radiculopathy, lumbar region: Secondary | ICD-10-CM | POA: Diagnosis not present

## 2021-08-28 DIAGNOSIS — Z85828 Personal history of other malignant neoplasm of skin: Secondary | ICD-10-CM | POA: Insufficient documentation

## 2021-08-28 DIAGNOSIS — I2510 Atherosclerotic heart disease of native coronary artery without angina pectoris: Secondary | ICD-10-CM | POA: Insufficient documentation

## 2021-08-28 DIAGNOSIS — G8929 Other chronic pain: Secondary | ICD-10-CM | POA: Insufficient documentation

## 2021-08-28 DIAGNOSIS — J449 Chronic obstructive pulmonary disease, unspecified: Secondary | ICD-10-CM | POA: Diagnosis not present

## 2021-08-28 DIAGNOSIS — E119 Type 2 diabetes mellitus without complications: Secondary | ICD-10-CM | POA: Diagnosis not present

## 2021-08-28 DIAGNOSIS — G834 Cauda equina syndrome: Secondary | ICD-10-CM

## 2021-08-28 DIAGNOSIS — Z7982 Long term (current) use of aspirin: Secondary | ICD-10-CM | POA: Insufficient documentation

## 2021-08-28 DIAGNOSIS — M48062 Spinal stenosis, lumbar region with neurogenic claudication: Secondary | ICD-10-CM

## 2021-08-28 DIAGNOSIS — Z7984 Long term (current) use of oral hypoglycemic drugs: Secondary | ICD-10-CM | POA: Insufficient documentation

## 2021-08-28 DIAGNOSIS — M5416 Radiculopathy, lumbar region: Secondary | ICD-10-CM | POA: Diagnosis not present

## 2021-08-28 DIAGNOSIS — I129 Hypertensive chronic kidney disease with stage 1 through stage 4 chronic kidney disease, or unspecified chronic kidney disease: Secondary | ICD-10-CM | POA: Diagnosis not present

## 2021-08-28 DIAGNOSIS — Z981 Arthrodesis status: Secondary | ICD-10-CM | POA: Diagnosis not present

## 2021-08-28 DIAGNOSIS — M4326 Fusion of spine, lumbar region: Secondary | ICD-10-CM | POA: Diagnosis not present

## 2021-08-28 HISTORY — DX: Cauda equina syndrome: G83.4

## 2021-08-28 LAB — GLUCOSE, CAPILLARY
Glucose-Capillary: 145 mg/dL — ABNORMAL HIGH (ref 70–99)
Glucose-Capillary: 118 mg/dL — ABNORMAL HIGH (ref 70–99)
Glucose-Capillary: 155 mg/dL — ABNORMAL HIGH (ref 70–99)
Glucose-Capillary: 170 mg/dL — ABNORMAL HIGH (ref 70–99)

## 2021-08-28 LAB — ABO/RH: ABO/RH(D): A POS

## 2021-08-28 SURGERY — POSTERIOR LUMBAR FUSION 1 LEVEL
Anesthesia: General

## 2021-08-28 MED ORDER — ONDANSETRON HCL 4 MG/2ML IJ SOLN: 4.0000 mg | Freq: Four times a day (QID) | INTRAMUSCULAR | Status: DC | PRN

## 2021-08-28 MED ORDER — PANTOPRAZOLE SODIUM 40 MG PO TBEC
40.0000 mg | DELAYED_RELEASE_TABLET | Freq: Every day | ORAL | Status: DC
  Administered 2021-08-28 – 2021-08-29 (×2): 40 mg via ORAL
  Filled 2021-08-28 (×2): qty 1

## 2021-08-28 MED ORDER — ACETAMINOPHEN 500 MG PO TABS
1000.0000 mg | ORAL_TABLET | Freq: Once | ORAL | Status: AC
  Administered 2021-08-28: 1000 mg via ORAL
  Filled 2021-08-28: qty 2

## 2021-08-28 MED ORDER — SEMAGLUTIDE 3 MG PO TABS
3.0000 mg | ORAL_TABLET | Freq: Every day | ORAL | Status: DC
Start: 2021-08-28 — End: 2021-08-29

## 2021-08-28 MED ORDER — METHOCARBAMOL 500 MG PO TABS
500.0000 mg | ORAL_TABLET | Freq: Four times a day (QID) | ORAL | Status: DC | PRN
  Administered 2021-08-28 – 2021-08-29 (×2): 500 mg via ORAL
  Filled 2021-08-28 (×2): qty 1

## 2021-08-28 MED ORDER — CEFAZOLIN SODIUM-DEXTROSE 2-4 GM/100ML-% IV SOLN
INTRAVENOUS | Status: AC
  Filled 2021-08-28: qty 100

## 2021-08-28 MED ORDER — ONDANSETRON HCL 4 MG PO TABS: 4.0000 mg | ORAL_TABLET | Freq: Four times a day (QID) | ORAL | Status: DC | PRN

## 2021-08-28 MED ORDER — MIDAZOLAM HCL 2 MG/2ML IJ SOLN: 0.5000 mg | Freq: Once | INTRAMUSCULAR | Status: DC | PRN

## 2021-08-28 MED ORDER — SPIRONOLACTONE 25 MG PO TABS
50.0000 mg | ORAL_TABLET | Freq: Every day | ORAL | Status: DC
  Administered 2021-08-28: 50 mg via ORAL
  Filled 2021-08-28 (×2): qty 2

## 2021-08-28 MED ORDER — ROCURONIUM BROMIDE 10 MG/ML (PF) SYRINGE
PREFILLED_SYRINGE | INTRAVENOUS | Status: DC | PRN
  Administered 2021-08-28: 60 mg via INTRAVENOUS
  Administered 2021-08-28: 20 mg via INTRAVENOUS

## 2021-08-28 MED ORDER — CARVEDILOL 12.5 MG PO TABS
12.5000 mg | ORAL_TABLET | Freq: Two times a day (BID) | ORAL | Status: DC
Start: 2021-08-28 — End: 2021-08-29
  Administered 2021-08-29: 12.5 mg via ORAL
  Filled 2021-08-28: qty 1

## 2021-08-28 MED ORDER — THROMBIN 5000 UNITS EX SOLR
OROMUCOSAL | Status: DC | PRN
  Administered 2021-08-28: 5 mL via TOPICAL

## 2021-08-28 MED ORDER — CHLORHEXIDINE GLUCONATE 0.12 % MT SOLN: 15.0000 mL | Freq: Once | OROMUCOSAL | Status: AC

## 2021-08-28 MED ORDER — SODIUM CHLORIDE 0.9% FLUSH: 3.0000 mL | INTRAVENOUS | Status: DC | PRN

## 2021-08-28 MED ORDER — ACETAMINOPHEN 325 MG PO TABS: 650.0000 mg | ORAL_TABLET | ORAL | Status: DC | PRN

## 2021-08-28 MED ORDER — BETHANECHOL CHLORIDE 25 MG PO TABS
50.0000 mg | ORAL_TABLET | Freq: Every day | ORAL | Status: DC
  Administered 2021-08-29: 50 mg via ORAL
  Filled 2021-08-28: qty 2

## 2021-08-28 MED ORDER — SUGAMMADEX SODIUM 200 MG/2ML IV SOLN
INTRAVENOUS | Status: DC | PRN
  Administered 2021-08-28: 200 mg via INTRAVENOUS

## 2021-08-28 MED ORDER — FUROSEMIDE 40 MG PO TABS
40.0000 mg | ORAL_TABLET | Freq: Every day | ORAL | Status: DC
  Administered 2021-08-28: 40 mg via ORAL
  Filled 2021-08-28 (×2): qty 1

## 2021-08-28 MED ORDER — PHENYLEPHRINE HCL (PRESSORS) 10 MG/ML IV SOLN
INTRAVENOUS | Status: AC
  Filled 2021-08-28: qty 1

## 2021-08-28 MED ORDER — THROMBIN 5000 UNITS EX SOLR
CUTANEOUS | Status: AC
  Filled 2021-08-28: qty 5000

## 2021-08-28 MED ORDER — MORPHINE SULFATE (PF) 2 MG/ML IV SOLN: 2.0000 mg | INTRAVENOUS | Status: DC | PRN

## 2021-08-28 MED ORDER — ORAL CARE MOUTH RINSE: 15.0000 mL | Freq: Once | OROMUCOSAL | Status: AC

## 2021-08-28 MED ORDER — MEPERIDINE HCL 25 MG/ML IJ SOLN: 6.2500 mg | INTRAMUSCULAR | Status: DC | PRN

## 2021-08-28 MED ORDER — CEFAZOLIN SODIUM-DEXTROSE 2-4 GM/100ML-% IV SOLN
2.0000 g | INTRAVENOUS | Status: AC
  Administered 2021-08-28: 2 g via INTRAVENOUS

## 2021-08-28 MED ORDER — PHENYLEPHRINE 80 MCG/ML (10ML) SYRINGE FOR IV PUSH (FOR BLOOD PRESSURE SUPPORT)
PREFILLED_SYRINGE | INTRAVENOUS | Status: DC | PRN
  Administered 2021-08-28 (×2): 160 ug via INTRAVENOUS

## 2021-08-28 MED ORDER — OXYCODONE HCL 5 MG/5ML PO SOLN: 5.0000 mg | Freq: Once | ORAL | Status: DC | PRN

## 2021-08-28 MED ORDER — CHLORHEXIDINE GLUCONATE CLOTH 2 % EX PADS: 6.0000 | MEDICATED_PAD | Freq: Once | CUTANEOUS | Status: DC

## 2021-08-28 MED ORDER — CYANOCOBALAMIN 500 MCG PO TABS
500.0000 ug | ORAL_TABLET | Freq: Every day | ORAL | Status: DC
  Administered 2021-08-29: 500 ug via ORAL
  Filled 2021-08-28: qty 1

## 2021-08-28 MED ORDER — SODIUM CHLORIDE 0.9 % IV SOLN: 250.0000 mL | INTRAVENOUS | Status: DC

## 2021-08-28 MED ORDER — PHENOL 1.4 % MT LIQD: 1.0000 | OROMUCOSAL | Status: DC | PRN

## 2021-08-28 MED ORDER — PHENYLEPHRINE HCL-NACL 20-0.9 MG/250ML-% IV SOLN
INTRAVENOUS | Status: DC | PRN
  Administered 2021-08-28: 25 ug/min via INTRAVENOUS

## 2021-08-28 MED ORDER — PANTOPRAZOLE SODIUM 40 MG IV SOLR: 40.0000 mg | Freq: Every day | INTRAVENOUS | Status: DC

## 2021-08-28 MED ORDER — SODIUM CHLORIDE 0.9% FLUSH: 3.0000 mL | Freq: Two times a day (BID) | INTRAVENOUS | Status: DC

## 2021-08-28 MED ORDER — HYDROMORPHONE HCL 1 MG/ML IJ SOLN
INTRAMUSCULAR | Status: DC | PRN
Start: 2021-08-28 — End: 2021-08-28
  Administered 2021-08-28: .25 mg via INTRAVENOUS

## 2021-08-28 MED ORDER — BUPIVACAINE HCL (PF) 0.5 % IJ SOLN
INTRAMUSCULAR | Status: DC | PRN
  Administered 2021-08-28: 4 mL

## 2021-08-28 MED ORDER — CHLORHEXIDINE GLUCONATE 0.12 % MT SOLN
OROMUCOSAL | Status: AC
  Administered 2021-08-28: 15 mL via OROMUCOSAL
  Filled 2021-08-28: qty 15

## 2021-08-28 MED ORDER — 0.9 % SODIUM CHLORIDE (POUR BTL) OPTIME
TOPICAL | Status: DC | PRN
  Administered 2021-08-28: 1000 mL

## 2021-08-28 MED ORDER — BETHANECHOL CHLORIDE 25 MG PO TABS
25.0000 mg | ORAL_TABLET | Freq: Every day | ORAL | Status: DC
  Administered 2021-08-28: 25 mg via ORAL
  Filled 2021-08-28 (×2): qty 1

## 2021-08-28 MED ORDER — LIDOCAINE-EPINEPHRINE 1 %-1:100000 IJ SOLN
INTRAMUSCULAR | Status: AC
  Filled 2021-08-28: qty 1

## 2021-08-28 MED ORDER — PRAVASTATIN SODIUM 40 MG PO TABS
40.0000 mg | ORAL_TABLET | Freq: Every day | ORAL | Status: DC
  Administered 2021-08-28: 40 mg via ORAL
  Filled 2021-08-28: qty 1

## 2021-08-28 MED ORDER — HYDROMORPHONE HCL 1 MG/ML IJ SOLN
INTRAMUSCULAR | Status: AC
  Filled 2021-08-28: qty 1

## 2021-08-28 MED ORDER — OXYCODONE HCL 5 MG PO TABS
10.0000 mg | ORAL_TABLET | ORAL | Status: DC | PRN
  Administered 2021-08-28 – 2021-08-29 (×3): 10 mg via ORAL
  Filled 2021-08-28 (×3): qty 2

## 2021-08-28 MED ORDER — FENTANYL CITRATE (PF) 250 MCG/5ML IJ SOLN
INTRAMUSCULAR | Status: DC | PRN
  Administered 2021-08-28: 50 ug via INTRAVENOUS
  Administered 2021-08-28: 125 ug via INTRAVENOUS

## 2021-08-28 MED ORDER — DOCUSATE SODIUM 100 MG PO CAPS
100.0000 mg | ORAL_CAPSULE | Freq: Two times a day (BID) | ORAL | Status: DC
  Administered 2021-08-28 – 2021-08-29 (×2): 100 mg via ORAL
  Filled 2021-08-28 (×2): qty 1

## 2021-08-28 MED ORDER — PROPOFOL 10 MG/ML IV BOLUS
INTRAVENOUS | Status: DC | PRN
  Administered 2021-08-28: 80 mg via INTRAVENOUS

## 2021-08-28 MED ORDER — ACETAMINOPHEN 650 MG RE SUPP: 650.0000 mg | RECTAL | Status: DC | PRN

## 2021-08-28 MED ORDER — SODIUM CHLORIDE 0.9 % IV SOLN: INTRAVENOUS | Status: DC

## 2021-08-28 MED ORDER — LACTATED RINGERS IV SOLN: INTRAVENOUS | Status: DC

## 2021-08-28 MED ORDER — INSULIN ASPART 100 UNIT/ML IJ SOLN: 0.0000 [IU] | Freq: Every day | INTRAMUSCULAR | Status: DC

## 2021-08-28 MED ORDER — OXYCODONE HCL 5 MG PO TABS: 5.0000 mg | ORAL_TABLET | Freq: Once | ORAL | Status: DC | PRN

## 2021-08-28 MED ORDER — BUPIVACAINE HCL (PF) 0.5 % IJ SOLN
INTRAMUSCULAR | Status: AC
  Filled 2021-08-28: qty 30

## 2021-08-28 MED ORDER — NITROFURANTOIN MONOHYD MACRO 100 MG PO CAPS
100.0000 mg | ORAL_CAPSULE | Freq: Every day | ORAL | Status: DC
  Administered 2021-08-28 – 2021-08-29 (×2): 100 mg via ORAL
  Filled 2021-08-28 (×3): qty 1

## 2021-08-28 MED ORDER — HYDROMORPHONE HCL 1 MG/ML IJ SOLN
INTRAMUSCULAR | Status: AC
  Filled 2021-08-28: qty 0.5

## 2021-08-28 MED ORDER — CEFAZOLIN SODIUM-DEXTROSE 2-4 GM/100ML-% IV SOLN
2.0000 g | Freq: Three times a day (TID) | INTRAVENOUS | Status: AC
  Administered 2021-08-28 – 2021-08-29 (×2): 2 g via INTRAVENOUS
  Filled 2021-08-28 (×2): qty 100

## 2021-08-28 MED ORDER — PROPOFOL 10 MG/ML IV BOLUS
INTRAVENOUS | Status: AC
  Filled 2021-08-28: qty 20

## 2021-08-28 MED ORDER — LOSARTAN POTASSIUM 50 MG PO TABS
50.0000 mg | ORAL_TABLET | Freq: Every day | ORAL | Status: DC
Start: 2021-08-28 — End: 2021-08-29

## 2021-08-28 MED ORDER — HYDROMORPHONE HCL 1 MG/ML IJ SOLN
0.2500 mg | INTRAMUSCULAR | Status: DC | PRN
  Administered 2021-08-28: 0.25 mg via INTRAVENOUS
  Administered 2021-08-28: 0.5 mg via INTRAVENOUS
  Administered 2021-08-28: 0.25 mg via INTRAVENOUS

## 2021-08-28 MED ORDER — INSULIN ASPART 100 UNIT/ML IJ SOLN
0.0000 [IU] | Freq: Three times a day (TID) | INTRAMUSCULAR | Status: DC
  Administered 2021-08-28 – 2021-08-29 (×2): 3 [IU] via SUBCUTANEOUS

## 2021-08-28 MED ORDER — FENTANYL CITRATE (PF) 250 MCG/5ML IJ SOLN
INTRAMUSCULAR | Status: AC
  Filled 2021-08-28: qty 5

## 2021-08-28 MED ORDER — FLUTICASONE FUROATE-VILANTEROL 100-25 MCG/ACT IN AEPB
1.0000 | INHALATION_SPRAY | Freq: Every day | RESPIRATORY_TRACT | Status: DC
  Administered 2021-08-29: 1 via RESPIRATORY_TRACT
  Filled 2021-08-28: qty 28

## 2021-08-28 MED ORDER — LIDOCAINE-EPINEPHRINE 1 %-1:100000 IJ SOLN
INTRAMUSCULAR | Status: DC | PRN
  Administered 2021-08-28: 4 mL

## 2021-08-28 MED ORDER — HYDROCODONE-ACETAMINOPHEN 10-325 MG PO TABS: 0.5000 | ORAL_TABLET | Freq: Four times a day (QID) | ORAL | Status: DC | PRN

## 2021-08-28 MED ORDER — METHOCARBAMOL 1000 MG/10ML IJ SOLN
500.0000 mg | Freq: Four times a day (QID) | INTRAVENOUS | Status: DC | PRN
  Filled 2021-08-28: qty 5

## 2021-08-28 MED ORDER — MENTHOL 3 MG MT LOZG: 1.0000 | LOZENGE | OROMUCOSAL | Status: DC | PRN

## 2021-08-28 MED ORDER — NITROGLYCERIN 0.4 MG SL SUBL: 0.4000 mg | SUBLINGUAL_TABLET | SUBLINGUAL | Status: DC | PRN

## 2021-08-28 MED ORDER — ALBUTEROL SULFATE (2.5 MG/3ML) 0.083% IN NEBU: 2.5000 mg | INHALATION_SOLUTION | RESPIRATORY_TRACT | Status: DC | PRN

## 2021-08-28 MED ORDER — BISACODYL 10 MG RE SUPP: 10.0000 mg | Freq: Every day | RECTAL | Status: DC | PRN

## 2021-08-28 MED ORDER — DEXAMETHASONE SODIUM PHOSPHATE 10 MG/ML IJ SOLN
INTRAMUSCULAR | Status: DC | PRN
  Administered 2021-08-28: 10 mg via INTRAVENOUS

## 2021-08-28 MED ORDER — GABAPENTIN 600 MG PO TABS
600.0000 mg | ORAL_TABLET | Freq: Every day | ORAL | Status: DC
Start: 2021-08-28 — End: 2021-08-29
  Administered 2021-08-28: 600 mg via ORAL
  Filled 2021-08-28: qty 1

## 2021-08-28 MED ORDER — OXYCODONE HCL 5 MG PO TABS: 5.0000 mg | ORAL_TABLET | ORAL | Status: DC | PRN

## 2021-08-28 MED ORDER — BETHANECHOL CHLORIDE 25 MG PO TABS: 50.0000 mg | ORAL_TABLET | ORAL | Status: DC

## 2021-08-28 MED ORDER — INSULIN ASPART 100 UNIT/ML IJ SOLN: 0.0000 [IU] | INTRAMUSCULAR | Status: DC | PRN

## 2021-08-28 MED ORDER — SENNA 8.6 MG PO TABS
1.0000 | ORAL_TABLET | Freq: Two times a day (BID) | ORAL | Status: DC
  Administered 2021-08-29: 8.6 mg via ORAL
  Filled 2021-08-28: qty 1

## 2021-08-28 MED ORDER — TAMSULOSIN HCL 0.4 MG PO CAPS
0.4000 mg | ORAL_CAPSULE | Freq: Every day | ORAL | Status: DC
  Administered 2021-08-28: 0.4 mg via ORAL
  Filled 2021-08-28: qty 1

## 2021-08-28 SURGICAL SUPPLY — 72 items
BAG COUNTER SPONGE SURGICOUNT (BAG) ×3 IMPLANT
BASKET BONE COLLECTION (BASKET) ×2 IMPLANT
BENZOIN TINCTURE PRP APPL 2/3 (GAUZE/BANDAGES/DRESSINGS) IMPLANT
BLADE BONE MILL MEDIUM (MISCELLANEOUS) ×1 IMPLANT
BLADE CLIPPER SURG (BLADE) IMPLANT
BLADE SURG 11 STRL SS (BLADE) ×2 IMPLANT
BUR MATCHSTICK NEURO 3.0 LAGG (BURR) ×2 IMPLANT
BUR PRECISION FLUTE 5.0 (BURR) ×2 IMPLANT
CAGE INTERBODY SHORT 7X29X24 (Cage) ×1 IMPLANT
CANISTER SUCT 3000ML PPV (MISCELLANEOUS) ×2 IMPLANT
CARTRIDGE OIL MAESTRO DRILL (MISCELLANEOUS) ×1 IMPLANT
CNTNR URN SCR LID CUP LEK RST (MISCELLANEOUS) ×1 IMPLANT
CONT SPEC 4OZ STRL OR WHT (MISCELLANEOUS) ×2
COVER BACK TABLE 60X90IN (DRAPES) ×2 IMPLANT
DECANTER SPIKE VIAL GLASS SM (MISCELLANEOUS) ×1 IMPLANT
DERMABOND ADVANCED (GAUZE/BANDAGES/DRESSINGS) ×1
DERMABOND ADVANCED .7 DNX12 (GAUZE/BANDAGES/DRESSINGS) ×1 IMPLANT
DIFFUSER DRILL AIR PNEUMATIC (MISCELLANEOUS) ×2 IMPLANT
DRAPE C-ARM 42X72 X-RAY (DRAPES) ×2 IMPLANT
DRAPE C-ARMOR (DRAPES) ×2 IMPLANT
DRAPE LAPAROTOMY 100X72X124 (DRAPES) ×2 IMPLANT
DRAPE SURG 17X23 STRL (DRAPES) ×2 IMPLANT
DRSG OPSITE POSTOP 4X6 (GAUZE/BANDAGES/DRESSINGS) ×1 IMPLANT
DURAPREP 26ML APPLICATOR (WOUND CARE) ×2 IMPLANT
ELECT REM PT RETURN 9FT ADLT (ELECTROSURGICAL) ×2
ELECTRODE REM PT RTRN 9FT ADLT (ELECTROSURGICAL) ×1 IMPLANT
GAUZE 4X4 16PLY ~~LOC~~+RFID DBL (SPONGE) IMPLANT
GAUZE SPONGE 4X4 12PLY STRL (GAUZE/BANDAGES/DRESSINGS) IMPLANT
GLOVE BIO SURGEON STRL SZ7.5 (GLOVE) IMPLANT
GLOVE BIOGEL PI IND STRL 7.5 (GLOVE) ×2 IMPLANT
GLOVE BIOGEL PI INDICATOR 7.5 (GLOVE) ×2
GLOVE ECLIPSE 7.0 STRL STRAW (GLOVE) ×4 IMPLANT
GLOVE EXAM NITRILE XL STR (GLOVE) IMPLANT
GOWN STRL REUS W/ TWL LRG LVL3 (GOWN DISPOSABLE) ×4 IMPLANT
GOWN STRL REUS W/ TWL XL LVL3 (GOWN DISPOSABLE) IMPLANT
GOWN STRL REUS W/TWL 2XL LVL3 (GOWN DISPOSABLE) IMPLANT
GOWN STRL REUS W/TWL LRG LVL3 (GOWN DISPOSABLE) ×4
GOWN STRL REUS W/TWL XL LVL3 (GOWN DISPOSABLE) ×6
GRAFT BONE PROTEIOS XS 0.5CC (Orthopedic Implant) ×1 IMPLANT
HEMOSTAT POWDER KIT SURGIFOAM (HEMOSTASIS) ×2 IMPLANT
KIT BASIN OR (CUSTOM PROCEDURE TRAY) ×2 IMPLANT
KIT POSITION SURG JACKSON T1 (MISCELLANEOUS) ×2 IMPLANT
KIT TURNOVER KIT B (KITS) ×2 IMPLANT
MILL BONE PREP (MISCELLANEOUS) ×2 IMPLANT
NDL HYPO 18GX1.5 BLUNT FILL (NEEDLE) IMPLANT
NDL SPNL 18GX3.5 QUINCKE PK (NEEDLE) IMPLANT
NEEDLE HYPO 18GX1.5 BLUNT FILL (NEEDLE) IMPLANT
NEEDLE HYPO 22GX1.5 SAFETY (NEEDLE) ×2 IMPLANT
NEEDLE SPNL 18GX3.5 QUINCKE PK (NEEDLE) ×2 IMPLANT
NS IRRIG 1000ML POUR BTL (IV SOLUTION) ×2 IMPLANT
OIL CARTRIDGE MAESTRO DRILL (MISCELLANEOUS) ×2
PACK LAMINECTOMY NEURO (CUSTOM PROCEDURE TRAY) ×2 IMPLANT
PAD ARMBOARD 7.5X6 YLW CONV (MISCELLANEOUS) ×6 IMPLANT
PUTTY DBF GRAFTON 3CC W/DELIVE (Putty) ×1 IMPLANT
ROD COBALT 47.5X35 (Rod) ×2 IMPLANT
SCREW 5.5X30MM (Screw) ×2 IMPLANT
SCREW 5.5X35MM (Screw) ×4 IMPLANT
SCREW BN 35X5.5XMA NS SPNE (Screw) IMPLANT
SCREW SET SOLERA (Screw) ×8 IMPLANT
SCREW SET SOLERA TI (Screw) IMPLANT
SPONGE SURGIFOAM ABS GEL 100 (HEMOSTASIS) IMPLANT
SPONGE T-LAP 4X18 ~~LOC~~+RFID (SPONGE) IMPLANT
STRIP CLOSURE SKIN 1/2X4 (GAUZE/BANDAGES/DRESSINGS) IMPLANT
SUT VIC AB 0 CT1 18XCR BRD8 (SUTURE) ×1 IMPLANT
SUT VIC AB 0 CT1 8-18 (SUTURE) ×2
SUT VICRYL 3-0 RB1 18 ABS (SUTURE) ×2 IMPLANT
SYR 3ML LL SCALE MARK (SYRINGE) ×6 IMPLANT
TOWEL GREEN STERILE (TOWEL DISPOSABLE) ×2 IMPLANT
TOWEL GREEN STERILE FF (TOWEL DISPOSABLE) ×2 IMPLANT
TRAY FOLEY MTR SLVR 14FR STAT (SET/KITS/TRAYS/PACK) ×1 IMPLANT
TRAY FOLEY MTR SLVR 16FR STAT (SET/KITS/TRAYS/PACK) ×1 IMPLANT
WATER STERILE IRR 1000ML POUR (IV SOLUTION) ×2 IMPLANT

## 2021-08-28 NOTE — Transfer of Care (Signed)
Immediate Anesthesia Transfer of Care Note  Patient: Lisa Maxwell  Procedure(s) Performed: Posterior Lumbar Interbody Fusion  Lumbar two-three  Patient Location: PACU  Anesthesia Type:General  Level of Consciousness: patient cooperative  Airway & Oxygen Therapy: Patient Spontanous Breathing  Post-op Assessment: Report given to RN and Post -op Vital signs reviewed and stable  Post vital signs: Reviewed and stable  Last Vitals:  Vitals Value Taken Time  BP 120/54 08/28/21 1623  Temp    Pulse 73 08/28/21 1626  Resp 19 08/28/21 1626  SpO2 89 % 08/28/21 1626  Vitals shown include unvalidated device data.  Last Pain:  Vitals:   08/28/21 1050  TempSrc:   PainSc: 6          Complications: No notable events documented.

## 2021-08-28 NOTE — Anesthesia Postprocedure Evaluation (Signed)
Anesthesia Post Note  Patient: Lisa Maxwell  Procedure(s) Performed: Posterior Lumbar Interbody Fusion  Lumbar two-three     Patient location during evaluation: PACU Anesthesia Type: General Level of consciousness: awake and alert, patient cooperative and oriented Pain management: pain level controlled Vital Signs Assessment: post-procedure vital signs reviewed and stable Respiratory status: spontaneous breathing, nonlabored ventilation and respiratory function stable Cardiovascular status: blood pressure returned to baseline and stable Postop Assessment: no apparent nausea or vomiting Anesthetic complications: no   No notable events documented.  Last Vitals:  Vitals:   08/28/21 1725 08/28/21 1740  BP: 137/63 131/64  Pulse: (!) 58 63  Resp: 12 13  Temp:    SpO2: 99% 96%    Last Pain:  Vitals:   08/28/21 1725  TempSrc:   PainSc: Asleep                 Darrian Grzelak,E. Gerson Fauth

## 2021-08-28 NOTE — Progress Notes (Signed)
Orthopedic Tech Progress Note Patient Details:  Lisa Maxwell 1945/12/24 [id number]  LSO dropped off to [facility] desk for when pt arrives from PACU.  Ortho Devices Type of Ortho Device: Lumbar corsett Ortho Device/Splint Location: Dropped off to nurses station at [place] Interventions: Ordered      [name] 08/28/2021, 6:36 PM

## 2021-08-28 NOTE — Op Note (Signed)
NEUROSURGERY OPERATIVE NOTE   PREOP DIAGNOSIS:  1. Lumbar stenosis with neurogenic claudication 2. Lumbar degenerative disc disease 3. Cauda Equina Syndrome  POSTOP DIAGNOSIS: Same  PROCEDURE: 1. L2-3 laminectomy with facetectomy for decompression of exiting nerve roots, more than would be required for placement of interbody graft 2. Placement of anterior interbody device - Medtronic 85m expandable cage 3. Posterior non-segmental instrumentation using cortical pedicle screws at L2 - L3 4. Interbody arthrodesis, L2-3 5. Use of locally harvested bone autograft 6. Use of non-structural bone allograft - DBM, ProteiOs  SURGEON: Dr. NConsuella Lose MD  ASSISTANT: None  ANESTHESIA: General Endotracheal  EBL: 100cc  SPECIMENS: None  DRAINS: None  COMPLICATIONS: None immediate  CONDITION: Hemodynamically stable to PACU  HISTORY: Lisa CARDINis a 76y.o. female who has been followed in the outpatient clinic with back and leg pain related to spondylosis and stenosis.  Patient has a history of chronic back and leg pain and has previously undergone placement of a spinal cord stimulator.  She presented back to the outpatient clinic with progression of symptoms including subjective leg weakness, perineal anesthesia, and symptoms of urinary retention.  Her MRI did reveal progressive critical multifactorial stenosis at L2-3.  Relatively urgent surgical decompression for incomplete cauda equina syndrome was therefore indicated.  The risks, benefits, and alternatives to surgery were all reviewed in detail with the patient.  After all her questions were answered informed consent was obtained and witnessed.  PROCEDURE IN DETAIL: The patient was brought to the operating room via stretcher. After induction of general anesthesia, the patient was positioned on the operative table in the prone position. All pressure points were meticulously padded.  Intraoperative fluoroscopy was used to identify  the previously placed spinal cord stimulator leads.  A slightly left paramedian incision was then marked out in an attempt to avoid the underlying leads.  The region was then prepped and draped in the usual sterile fashion.  After timeout was conducted, skin was infiltrated with local anesthetic. Skin incision was then made sharply and Bovie electrocautery was used to dissect the subcutaneous tissue until the lumbodorsal fascia was identified and incised. The muscle was then elevated in the subperiosteal plane and the L2 lamina and L2-3 facet complexes were identified. Self-retaining retractors were then placed. Lateral fluoroscopy was taken with a dissector in the L2-3 interspace to confirm our location.  At this point attention was turned to decompression. Complete L2 and partial superior L3 laminectomy was completed with a high-speed drill and Kerrison punches.  Normal dura was identified.  A ball-tipped dissector was then used to identify the foramina bilaterally.  High-speed drill was used to cut across the pars interarticularis and the inferior articulating process of L2 was removed bilaterally.  The exiting nerve roots and the traversing nerve roots were then identified.  Kerrison punch was used to remove the medial and superior aspect of the L3 superior articulating process.  I did note significant rotational component to the patient's L2 vertebral body, angled towards her right side.  After decompression, I was then able to easily pass a ball dissector in the ventral epidural space and underneath the bilateral L2 and L3 nerves indicating good decompression.   Disc space was then identified, incised on the patient's right side, and using a combination of shavers, curettes and rongeurs, complete discectomy was completed. Endplates were prepared with curettes, and bone harvested during decompression was mixed with proteiOs and packed into the interspace. A 766mexpandable cage was tapped into  place. Cage  was expanded to achieve good endplate apposition.  Good position was confirmed with fluoroscopy.  At this point, the entry points for bilateral L2 and L3 cortical pedicle screws were identified using standard anatomic landmarks and lateral fluoro. Pilot holes were then drilled and tapped to 5.5 x 78m. Screws were then placed in L2 and L3. Prebent lordotic rod was then sized and placed into the pedicle screws. Set screws were placed and final tightened. Final AP and lateral fluoroscopic images confirmed good position.  Hemostasis was secured and confirmed with bipolar cautery and morcellized gelfoam with thrombin. The wound was then irrigated with copious amounts of antibiotic saline, then closed in standard fashion using a combination of interrupted 0 and 3-0 Vicryl stitches in the muscular, fascial, and subcutaneous layers. Skin was then closed using standard Dermabond. Sterile dressing was then applied. The patient was then transferred to the stretcher, extubated, and taken to the postanesthesia care unit in stable hemodynamic condition.  At the end of the case all sponge, needle, cottonoid, and instrument counts were correct.   NConsuella Lose MD COrange [facility]ba [facility]

## 2021-08-28 NOTE — H&P (Signed)
Chief Complaint   Back and leg pain  History of Present Illness  Lisa Maxwell is a 76 y.o. female with relatively chronic back and leg pain.  Initially she was not felt to be a good surgical candidate and she was referred to our Pain Management colleagues.  She has since undergone placement of a spinal cord stimulator and is currently under the treatment of a pain management physician in [place].  Unfortunately, over the last 1-2 months she has noted some worsening in both her back and leg pain.  She also began to notice weakness primarily involving her right foot to the point where she felt like she was dragging her foot when she walked and was a fall risk.  In fact, she has been using a wheelchair for the last few months because of a fall back in February  injuring her left shoulder which makes it difficult for her to support her weight utilizing a cane or walker.  In addition to her foot weakness, over the last 6-8 weeks she has also noted feeling of a full bladder but inability to actually void.  She has been seen by Urology and actually had a Foley catheter in for a few weeks which was discontinued but persistence of her urinary retention.  She does not report any numbness or tingling involving the abdomen, groin, or legs.  She does get a little bit of tingling associated with the use of her spinal cord stimulator.  Of note, the patient does have a history of hypertension, medically controlled.  Also appears to have well controlled type 2 diabetes reporting her last hemoglobin A1c less than 7.0.  Patient has a history of atrial fibrillation but was taken off her Eliquis due to being a fall risk.  She is under the care of a cardiologist, Dr. Geraldo Pitter in [place].  She does not report any history of heart attack or stroke.  She does have a history of asthma and does have a pulmonologist - Dr. Gardiner Rhyme also in [place].  She does not report any known liver disease.  She has stage 3 chronic kidney  disease.  Past Medical History   Past Medical History:  Diagnosis Date   CAD (coronary artery disease) 11/04/2016   Cancer ([place])    skin cancer   Cerebrovascular disease    Chronic atrial fibrillation ([place]) 09/28/2018   Chronic bilateral low back pain without sciatica 03/08/2018   Chronic kidney disease    Chronic obstructive lung disease ([place]) 09/28/2018   Chronic pain syndrome 03/08/2018   Chronic renal failure 09/28/2018   Chronic, continuous use of opioids 03/04/2021   Last Assessment & Plan:  Formatting of this note might be different from the original. Patient and I have discussed the hazardous effects of continued opiate pain medication usage. Risks and benefits of above medications including but not limited to possibility of respiratory depression, sedation, and even death were discussed with the patient who expressed an understanding.  Patient did not displ   CKD (chronic kidney disease) stage 3, GFR 30-59 ml/min (HCC)    Comprehensive diabetic foot examination, type 2 DM, encounter for ([place]) 04/29/2021   DDD (degenerative disc disease), lumbar 11/17/2018   Last Assessment & Plan:  Formatting of this note might be different from the original. See spinal stenosis plan   Diabetes ([place])    Diabetes mellitus due to underlying condition with unspecified complications ([place]) 0/04/270   Dyshidrotic eczema 04/29/2021   Dysrhythmia    afib  Essential hypertension 11/04/2016   Facet arthritis of lumbar region 03/08/2018   Facet joint disease 11/17/2018   Last Assessment & Plan:  Formatting of this note might be different from the original. See spinal stenosis plan   Foraminal stenosis of lumbar region 03/08/2018   Heart murmur    History of knee replacement, total, bilateral 11/17/2018   Last Assessment & Plan:  Formatting of this note might be different from the original. Have recommended to her several times if any further concerns in regards to the pain status post arthroplasty, she is to follow  up with her surgeon.   Hypertension    Hypertensive disorder 09/28/2018   Increased body mass index 09/28/2018   Lumbar radiculopathy 03/08/2018   Lumbar spondylosis 03/20/2019   Mixed dyslipidemia 11/04/2016   Morbid obesity ([place]) 11/04/2016   Muscle pain 04/29/2020   Osteoarthritis of left knee    PAF (paroxysmal atrial fibrillation) ([place]) 04/30/2015   Pain management contract agreement 04/13/2019   Last Assessment & Plan:  Formatting of this note might be different from the original. Contract updated today.  UDS completed.  [place] controlled substance registry reviewed and is consistent with her regimen.   Peripheral vascular disease ([place]) 09/28/2018   Preop cardiovascular exam 05/01/2019   Renal insufficiency 04/30/2015   S/P insertion of spinal cord stimulator 07/13/2019   Last Assessment & Plan:  Formatting of this note might be different from the original. About 5 months status post insertion of spinal cord stimulator with excellent relief.  She continues to keep close follow-up with her representative and has been working to appropriately find best programs to get her adequate pain relief.   Sleep apnea    Smoker 09/28/2018   Spinal stenosis of lumbar region 03/20/2019   Spinal stenosis of lumbar region with neurogenic claudication 03/08/2018   Last Assessment & Plan:  Formatting of this note is different from the original. 76 year old female with chronic low back pain and bilateral, right greater than left, L4-5 radicular pain, and claudication. She has substantial amount of multifactorial degenerative thoracic and lumbar spine pathology.  After being deemed an inappropriate open surgical candidate, and failing to respond to multiple in   Spondylolisthesis 03/20/2019   Spondylosis of lumbosacral region without myelopathy or radiculopathy 01/30/2019   Last Assessment & Plan:  Formatting of this note might be different from the original. See spinal stenosis plan   Type 2 diabetes mellitus  ([place]) 09/28/2018    Past Surgical History   Past Surgical History:  Procedure Laterality Date   ABDOMINAL HYSTERECTOMY     BACK SURGERY     EYE SURGERY     bilateral cataracts   JOINT REPLACEMENT Bilateral    knees   SPINAL CORD STIMULATOR INSERTION N/A 07/07/2019   Procedure: LUMBAR SPINAL CORD STIMULATOR INSERTION;  Surgeon: Clydell Hakim, MD;  Location: [facility];  Service: Neurosurgery;  Laterality: N/A;   total left knee      Social History   Social History   Tobacco Use   Smoking status: Former    Types: Cigarettes    Quit date: 05/08/2019    Years since quitting: 2.3   Smokeless tobacco: Never  Vaping Use   Vaping Use: Never used  Substance Use Topics   Alcohol use: No   Drug use: No    Medications   Prior to Admission medications   Medication Sig Start Date End Date Taking? Authorizing Provider  aspirin 81 MG tablet Take 81 mg by mouth  daily.   Yes [provider]  bethanechol (URECHOLINE) 50 MG tablet Take 50-100 mg by mouth See admin instructions. Take 50 mg by mouth in the morning and 25 mg at night 08/12/21  Yes [provider]  carvedilol (COREG) 12.5 MG tablet Take 12.5 mg by mouth 2 (two) times daily. 04/25/19  Yes [provider]  fluticasone furoate-vilanterol (BREO ELLIPTA) 100-25 MCG/ACT AEPB Inhale 1 puff into the lungs daily.   Yes [provider]  furosemide (LASIX) 40 MG tablet Take 40 mg by mouth daily.  02/19/18  Yes [provider]  gabapentin (NEURONTIN) 600 MG tablet Take 600 mg by mouth at bedtime. 04/06/21  Yes [provider]  HYDROcodone-acetaminophen (NORCO) 10-325 MG tablet Take 0.5 tablets by mouth every 6 (six) hours as needed for severe pain.   Yes [provider]  loratadine (CLARITIN) 10 MG tablet Take 10 mg by mouth daily.   Yes [provider]  losartan (COZAAR) 50 MG tablet Take 50 mg by mouth daily.   Yes [provider]  lovastatin (MEVACOR) 40 MG tablet  Take 40 mg by mouth at bedtime.    Yes [provider]  Multiple Vitamin (MULTIVITAMIN WITH MINERALS) TABS tablet Take 1 tablet by mouth daily.   Yes [provider]  nitrofurantoin, macrocrystal-monohydrate, (MACROBID) 100 MG capsule Take 100 mg by mouth daily. 07/30/21  Yes [provider]  PROAIR HFA 108 (90 Base) MCG/ACT inhaler Inhale 1-2 puffs into the lungs every 4 (four) hours as needed for wheezing or shortness of breath.  10/12/16  Yes [provider]  RYBELSUS 3 MG TABS Take 3 mg by mouth daily. 05/12/21  Yes [provider]  spironolactone (ALDACTONE) 50 MG tablet Take 50 mg by mouth daily. 01/04/21  Yes [provider]  tamsulosin (FLOMAX) 0.4 MG CAPS capsule Take 0.4 mg by mouth daily after supper. 06/10/19  Yes [provider]  vitamin B-12 (CYANOCOBALAMIN) 500 MCG tablet Take 500 mcg by mouth daily.    Yes [provider]  nitroGLYCERIN (NITROSTAT) 0.4 MG SL tablet Place 0.4 mg under the tongue every 5 (five) minutes as needed for chest pain.    [provider]    Allergies  No Known Allergies  Review of Systems  ROS  Neurologic Exam  Awake, alert, oriented Memory and concentration grossly intact Speech fluent, appropriate CN grossly intact Motor exam: Upper Extremities Deltoid Bicep Tricep Grip  Right 5/5 5/5 5/5 5/5  Left 5/5 5/5 5/5 5/5   Lower Extremities IP Quad PF DF EHL  Right 5/5 5/5 5/5 5/5 5/5  Left 5/5 5/5 5/5 5/5 5/5   Sensation grossly intact to LT  Imaging  MRI of the lumbar spine dated 08/15/2021 was personally reviewed.  This demonstrates maintenance of lumbar lordosis.  There is moderate stenosis at T11-12 without any T2 signal change within the spinal cord.  Primary findings at L2-3 where there is significant disc desiccation and loss of height and broad-based disc bulge coupled with significant facet arthropathy and ligamentous hypertrophy leading to severe central and lateral  recess stenosis.  There is up to moderate central stenosis at L3-4 with joint diastasis.  Dynamic lumbar spine x-rays in the [facility] office were reviewed.  These demonstrate again significant disc desiccation and loss of height at L2-3.  There is trace grade 1 anterolisthesis at L3-4 with minimal increase with flexion.  There is also grade 1 anterolisthesis at L4-5 which does appear to increase in flexion.  Impression  - 76 year old woman with subacute to chronic symptoms of cauda equina syndrome including right greater than left footdrop and urinary retention related to severe progressive stenosis at L2-3.  Plan  - Will proceed with L2-3 decompression and fusion  I have reviewed the details of the surgery as well as the expected postoperative course and recovery at length with the patient in the office.  We have also discussed the associated risks, benefits, and alternatives to surgery.  All her questions today were answered and she provided informed consent to proceed.   Consuella Lose, MD [facility]

## 2021-08-28 NOTE — Anesthesia Procedure Notes (Signed)
Procedure Name: Intubation Date/Time: 08/28/2021 1:11 PM Performed by: Georgia Duff, CRNA Pre-anesthesia Checklist: Patient identified, Emergency Drugs available, Suction available and Patient being monitored Patient Re-evaluated:Patient Re-evaluated prior to induction Oxygen Delivery Method: Circle System Utilized Preoxygenation: Pre-oxygenation with 100% oxygen Induction Type: IV induction Ventilation: Mask ventilation without difficulty Laryngoscope Size: Mac and 3 Grade View: Grade I Tube type: Oral Tube size: 7.0 mm Number of attempts: 1 Airway Equipment and Method: Stylet and Oral airway Placement Confirmation: ETT inserted through vocal cords under direct vision, positive ETCO2 and breath sounds checked- equal and bilateral Secured at: 21 cm Tube secured with: Tape Dental Injury: Teeth and Oropharynx as per pre-operative assessment

## 2021-08-29 DIAGNOSIS — J449 Chronic obstructive pulmonary disease, unspecified: Secondary | ICD-10-CM | POA: Diagnosis not present

## 2021-08-29 DIAGNOSIS — M5136 Other intervertebral disc degeneration, lumbar region: Secondary | ICD-10-CM | POA: Diagnosis not present

## 2021-08-29 DIAGNOSIS — G834 Cauda equina syndrome: Secondary | ICD-10-CM | POA: Diagnosis not present

## 2021-08-29 DIAGNOSIS — I2510 Atherosclerotic heart disease of native coronary artery without angina pectoris: Secondary | ICD-10-CM | POA: Diagnosis not present

## 2021-08-29 DIAGNOSIS — M4726 Other spondylosis with radiculopathy, lumbar region: Secondary | ICD-10-CM | POA: Diagnosis not present

## 2021-08-29 DIAGNOSIS — N183 Chronic kidney disease, stage 3 unspecified: Secondary | ICD-10-CM | POA: Diagnosis not present

## 2021-08-29 DIAGNOSIS — E1122 Type 2 diabetes mellitus with diabetic chronic kidney disease: Secondary | ICD-10-CM | POA: Diagnosis not present

## 2021-08-29 DIAGNOSIS — M48062 Spinal stenosis, lumbar region with neurogenic claudication: Secondary | ICD-10-CM | POA: Diagnosis not present

## 2021-08-29 DIAGNOSIS — Z85828 Personal history of other malignant neoplasm of skin: Secondary | ICD-10-CM | POA: Diagnosis not present

## 2021-08-29 LAB — GLUCOSE, CAPILLARY: Glucose-Capillary: 168 mg/dL — ABNORMAL HIGH (ref 70–99)

## 2021-08-29 MED ORDER — ASPIRIN 81 MG PO TABS: 81.0000 mg | ORAL_TABLET | Freq: Every day | ORAL | Status: DC

## 2021-08-29 MED ORDER — METHOCARBAMOL 500 MG PO TABS: 500.0000 mg | ORAL_TABLET | Freq: Four times a day (QID) | ORAL | 0 refills | Status: DC | PRN

## 2021-08-29 NOTE — TOC Transition Note (Signed)
Transition of Care [facility]) - CM/SW Discharge Note   Patient Details  Name: Lisa Maxwell MRN: [id number] Date of Birth: Dec 18, 1945  [facility] CM/SW Contact:  Geralynn Ochs, LCSW Phone Number: 08/29/2021, 10:58 AM   Clinical Narrative:   Patient discharging home today, preference for outpatient at [facility] in [place]. MD agreeable to outpatient, placed orders, CSW faxed to [facility]. Husband to provide transport home. No other TOC needs at this time.    Final next level of care: Home/Self Care Barriers to Discharge: Barriers Resolved   Patient Goals and CMS Choice Patient states their goals for this hospitalization and ongoing recovery are:: to get back home to her grandbaby CMS Medicare.gov Compare Post Acute Care list provided to:: Patient Choice offered to / list presented to : Patient  Discharge Placement                Patient to be transferred to facility by: Family Name of family member notified: Self Patient and family notified of of transfer: 08/29/21  Discharge Plan and Services   Discharge Planning Services: CM Consult                                 Social Determinants of Health (SDOH) Interventions     Readmission Risk Interventions     View : No data to display.

## 2021-08-29 NOTE — Discharge Summary (Signed)
Physician Discharge Summary  Patient ID: Lisa Maxwell MRN: [id number] DOB/AGE: [birth year] 76 y.o.  Admit date: 08/28/2021 Discharge date: 08/29/2021  Admission Diagnoses:  Cauda equina syndrome  Discharge Diagnoses:  Same Principal Problem:   Cauda equina compression [facility])   Discharged Condition: Stable  Hospital Course:  Lisa Maxwell is a 76 y.o. female who underwent L2-3 decompression and fusion. She was at baseline postoperatively, ambulating well, tolerating diet, voiding normally. She was discharged in stable condition and requested outpatient Pt/OT.  Treatments: Surgery - L2-3 PLIF  Discharge Exam: Blood pressure 108/84, pulse 67, temperature 98.4 F (36.9 C), temperature source Oral, resp. rate 18, height '4\' 8"'$  (1.422 m), weight 69.4 kg, SpO2 98 %. Awake, alert, oriented Speech fluent, appropriate CN grossly intact 5/5 BUE/BLE Wound c/d/i  Disposition: Discharge disposition: 01-Home or Self Care       Discharge Instructions     Ambulatory referral to Occupational Therapy   Complete by: As directed    Ambulatory referral to Physical Therapy   Complete by: As directed    Call MD for:  redness, tenderness, or signs of infection (pain, swelling, redness, odor or green/yellow discharge around incision site)   Complete by: As directed    Call MD for:  temperature >100.4   Complete by: As directed    Diet - low sodium heart healthy   Complete by: As directed    Discharge instructions   Complete by: As directed    Walk at home as much as possible, at least 4 times / day   Incentive spirometry RT   Complete by: As directed    Increase activity slowly   Complete by: As directed    Lifting restrictions   Complete by: As directed    No lifting > 10 lbs   May shower / Bathe   Complete by: As directed    48 hours after surgery   May walk up steps   Complete by: As directed    Other Restrictions   Complete by: As directed    No bending/twisting at waist    Remove dressing in 24 hours   Complete by: As directed       Allergies as of 08/29/2021   No Known Allergies      Medication List     TAKE these medications    aspirin 81 MG tablet Take 1 tablet (81 mg total) by mouth daily. Start taking on: Sep 02, 2021 What changed: These instructions start on Sep 02, 2021. If you are unsure what to do until then, ask your doctor or other care provider.   bethanechol 50 MG tablet Commonly known as: URECHOLINE Take 50-100 mg by mouth See admin instructions. Take 50 mg by mouth in the morning and 25 mg at night   carvedilol 12.5 MG tablet Commonly known as: COREG Take 12.5 mg by mouth 2 (two) times daily.   fluticasone furoate-vilanterol 100-25 MCG/ACT Aepb Commonly known as: BREO ELLIPTA Inhale 1 puff into the lungs daily.   furosemide 40 MG tablet Commonly known as: LASIX Take 40 mg by mouth daily.   gabapentin 600 MG tablet Commonly known as: NEURONTIN Take 600 mg by mouth at bedtime.   HYDROcodone-acetaminophen 10-325 MG tablet Commonly known as: NORCO Take 0.5 tablets by mouth every 6 (six) hours as needed for severe pain.   loratadine 10 MG tablet Commonly known as: CLARITIN Take 10 mg by mouth daily.   losartan 50 MG tablet Commonly known as: COZAAR Take  50 mg by mouth daily.   lovastatin 40 MG tablet Commonly known as: MEVACOR Take 40 mg by mouth at bedtime.   methocarbamol 500 MG tablet Commonly known as: ROBAXIN Take 1 tablet (500 mg total) by mouth every 6 (six) hours as needed for muscle spasms.   multivitamin with minerals Tabs tablet Take 1 tablet by mouth daily.   nitrofurantoin (macrocrystal-monohydrate) 100 MG capsule Commonly known as: MACROBID Take 100 mg by mouth daily.   nitroGLYCERIN 0.4 MG SL tablet Commonly known as: NITROSTAT Place 0.4 mg under the tongue every 5 (five) minutes as needed for chest pain.   ProAir HFA 108 (90 Base) MCG/ACT inhaler Generic drug: albuterol Inhale 1-2  puffs into the lungs every 4 (four) hours as needed for wheezing or shortness of breath.   Rybelsus 3 MG Tabs Generic drug: Semaglutide Take 3 mg by mouth daily.   spironolactone 50 MG tablet Commonly known as: ALDACTONE Take 50 mg by mouth daily.   tamsulosin 0.4 MG Caps capsule Commonly known as: FLOMAX Take 0.4 mg by mouth daily after supper.   vitamin B-12 500 MCG tablet Commonly known as: CYANOCOBALAMIN Take 500 mcg by mouth daily.        Follow-up Information     Nicoletta Dress, MD Follow up.   Specialty: Internal Medicine Contact information: [address] [place] [address] [phone]         Revankar, Reita Cliche, MD .   Specialty: Cardiology Contact information: [place] [address] [place], [place] [facility] Follow up.   Specialty: Neurosurgery Contact information: [address] [place] [address] [phone]                 Signed: Jairo Ben 08/29/2021, 10:16 AM

## 2021-08-29 NOTE — Evaluation (Addendum)
Occupational Therapy Evaluation Patient Details Name: Lisa Maxwell MRN: [id number] DOB: 1946-01-04 Today's Date: 08/29/2021   History of Present Illness 76 yo female s/p L2-3 lami with decompression on 5/25. PMH including CAD, a-fib, CKD, DDD, deabetes, HTN,  OA L knee, s/p spinal cord stimulator, bil TKA, and PVD.   Clinical Impression   PTA, pt was living with her husband and performed BADLs and using RW for short distance mobility; primarily uses w/c. Currently, pt requires Min A for UB ADLs, Min-Mod A for LB ADLs, and Min Guard A for functional mobility using RW. Provided education and handout on back precautions, grooming, brace management, bed mobility, LB ADLs with AE, toileting, and functional transfers; pt demonstrated understanding. Answered all pt questions. Pt would benefit from further acute OT to facilitate safe dc. Recommend dc to home with HHOT for further OT to optimize safety, independence with ADLs, and return to PLOF. However, pt verbalizing she enjoyed OP after her shoulder sx. If prefers OP, pt will still benefit from OP OT to optimize independence with ADLs and safety as well as adherence to back precautions.    Recommendations for follow up therapy are one component of a multi-disciplinary discharge planning process, led by the attending physician.  Recommendations may be updated based on patient status, additional functional criteria and insurance authorization.   Follow Up Recommendations  Home health OT (Pt may prefer OP therapies as she utilized OP for prior shoulder injury. Will continue to require OT at either [facility] or OP level)    Assistance Recommended at Discharge Frequent or constant Supervision/Assistance  Patient can return home with the following A little help with walking and/or transfers;A little help with bathing/dressing/bathroom    Functional Status Assessment  Patient has had a recent decline in their functional status and demonstrates the ability to  make significant improvements in function in a reasonable and predictable amount of time.  Equipment Recommendations  None recommended by OT    Recommendations for Other Services PT consult     Precautions / Restrictions Precautions Precautions: Back Precaution Booklet Issued: Yes (comment) Precaution Comments: reviewed back precautions and compensatory techniques Restrictions Weight Bearing Restrictions: No      Mobility Bed Mobility Overal bed mobility: Needs Assistance Bed Mobility: Rolling, Sidelying to Sit Rolling: Min assist Sidelying to sit: Min guard       General bed mobility comments: Min A for guiding hips during roll. Min guard A for safety with power up into sitting    Transfers Overall transfer level: Needs assistance Equipment used: Rolling walker (2 wheels) Transfers: Sit to/from Stand Sit to Stand: Min guard           General transfer comment: Min Guard A for safety. Pt supporting herself with BLEs against EOB during power up      Balance Overall balance assessment: Needs assistance Sitting-balance support: No upper extremity supported, Feet supported Sitting balance-Leahy Scale: Good     Standing balance support: No upper extremity supported, During functional activity, Bilateral upper extremity supported Standing balance-Leahy Scale: Fair                             ADL either performed or assessed with clinical judgement   ADL Overall ADL's : Needs assistance/impaired Eating/Feeding: Set up;Sitting   Grooming: Supervision/safety;Set up;Sitting   Upper Body Bathing: Minimal assistance;Sitting   Lower Body Bathing: Moderate assistance;Sit to/from stand   Upper Body Dressing : Minimal assistance;Sitting Upper  Body Dressing Details (indicate cue type and reason): Min A for positioning brace and cues for sequencing donning of brace. Pt havign to readjust brace in standing Lower Body Dressing: Minimal assistance;Sit to/from  stand;Cueing for back precautions;With adaptive equipment Lower Body Dressing Details (indicate cue type and reason): Pt donning socks, depends, and pants with Min A for managing reacher and sock aide. Able to perform figure four for adjusting socks Toilet Transfer: Min guard;Ambulation;Rolling walker (2 wheels) (simulated to recliner)     Toileting - Clothing Manipulation Details (indicate cue type and reason): Educating on toilet hygiene     Functional mobility during ADLs: Min guard;Rolling walker (2 wheels) General ADL Comments: Providing education on compensatory techniques for bed mobility, brace management, LB ADLs with AE, and functional transfers     Vision Baseline Vision/History: 1 Wears glasses       Perception     Praxis      Pertinent Vitals/Pain Pain Assessment Pain Assessment: Faces Faces Pain Scale: Hurts little more Pain Location: Back Pain Descriptors / Indicators: Sore Pain Intervention(s): Monitored during session, Limited activity within patient's tolerance, Repositioned     Hand Dominance Right   Extremity/Trunk Assessment Upper Extremity Assessment Upper Extremity Assessment: LUE deficits/detail LUE Deficits / Details: Baseline shoulder injury. Tremors and limited shoulder ROM LUE Coordination: decreased gross motor   Lower Extremity Assessment Lower Extremity Assessment: Defer to PT evaluation   Cervical / Trunk Assessment Cervical / Trunk Assessment: Back Surgery   Communication Communication Communication: No difficulties   Cognition Arousal/Alertness: Awake/alert Behavior During Therapy: WFL for tasks assessed/performed Overall Cognitive Status: Within Functional Limits for tasks assessed                                       General Comments       Exercises     Shoulder Instructions      Home Living Family/patient expects to be discharged to:: Private residence Living Arrangements: Spouse/significant  other Available Help at Discharge: Family;Available 24 hours/day Type of Home: House Home Access: Ramped entrance     Home Layout: Two level Alternate Level Stairs-Number of Steps: 4 steps   Bathroom Shower/Tub:  (Pt sponge bathes)   Bathroom Toilet:  (Uses 3n1 by her coach)     Home Equipment: Conservation officer, nature (2 wheels);Rollator (4 wheels);BSC/3in1   Additional Comments: Goes to nieces house for shower once a week      Prior Functioning/Environment Prior Level of Function : Needs assist             Mobility Comments: Mainly uses w/c. short distances with walker ADLs Comments: Reports she does her ADLs. Husband does IADLs        OT Problem List: Decreased strength;Decreased range of motion;Decreased activity tolerance;Impaired balance (sitting and/or standing)      OT Treatment/Interventions: Self-care/ADL training;Therapeutic exercise;Energy conservation;DME and/or AE instruction;Therapeutic activities;Patient/family education    OT Goals(Current goals can be found in the care plan section) Acute Rehab OT Goals Patient Stated Goal: Go home OT Goal Formulation: With patient Time For Goal Achievement: 09/12/21 Potential to Achieve Goals: Good  OT Frequency: Min 2X/week    Co-evaluation PT/OT/SLP Co-Evaluation/Treatment: Yes Reason for Co-Treatment: For patient/therapist safety;To address functional/ADL transfers;Other (comment) (actvitiy tolerance)   OT goals addressed during session: ADL's and self-care      AM-PAC OT "6 Clicks" Daily Activity     Outcome Measure Help from another  person eating meals?: A Little Help from another person taking care of personal grooming?: A Little Help from another person toileting, which includes using toliet, bedpan, or urinal?: A Lot Help from another person bathing (including washing, rinsing, drying)?: A Lot Help from another person to put on and taking off regular upper body clothing?: A Little Help from another person to  put on and taking off regular lower body clothing?: A Little 6 Click Score: 16   End of Session Equipment Utilized During Treatment: Rolling walker (2 wheels) Nurse Communication: Mobility status  Activity Tolerance: Patient tolerated treatment well Patient left: in chair;with call bell/phone within reach  OT Visit Diagnosis: Unsteadiness on feet (R26.81);Other abnormalities of gait and mobility (R26.89);Muscle weakness (generalized) (M62.81)                Time: 5170-0174 OT Time Calculation (min): 39 min Charges:  OT General Charges $OT Visit: 1 Visit OT Evaluation $OT Eval Moderate Complexity: 1 Mod OT Treatments $Self Care/Home Management : 8-22 mins  Marye Eagen MSOT, OTR/L Acute Rehab Pager: [phone] Office: [place] 08/29/2021, 9:40 AM

## 2021-08-29 NOTE — TOC Initial Note (Signed)
Transition of Care [facility]) - Initial/Assessment Note    Patient Details  Name: Lisa Maxwell MRN: [id number] Date of Birth: [birth year]  [facility] CM/SW Contact:    Sharin Mons, RN Phone Number: 08/29/2021, 10:03 AM  Clinical Narrative:                       -  - s/p L2-3 lami with decompression on 5/25  From home with husband. PTA independent with ADL's. States without DME  needs, has equipment @ home  ( RW). Pt declined home health services if needed. States preference outpatient services @  [facility]. Husband to provide transportation toi home. Pt without Rx med concerns.  TOC team monitoring  and will assist with needs...  Expected Discharge Plan: Home/Self Care Barriers to Discharge: Continued Medical Work up   Patient Goals and CMS Choice        Expected Discharge Plan and Services Expected Discharge Plan: Home/Self Care   Discharge Planning Services: CM Consult                                          Prior Living Arrangements/Services   Lives with:: Spouse Patient language and need for interpreter reviewed:: Yes        Need for Family Participation in Patient Care: Yes (Comment) Care giver support system in place?: Yes (comment) Current home services: DME Criminal Activity/Legal Involvement Pertinent to Current Situation/Hospitalization: No - Comment as needed  Activities of Daily Living      Permission Sought/Granted                  Emotional Assessment Appearance:: Appears stated age Attitude/Demeanor/Rapport: Engaged Affect (typically observed): Accepting Orientation: : Oriented to Self, Oriented to Place, Oriented to  Time, Oriented to Situation Alcohol / Substance Use: Not Applicable Psych Involvement: No (comment)  Admission diagnosis:  Cauda equina compression (HCC) [G83.4] Patient Active Problem List   Diagnosis Date Noted   Cauda equina compression ([place]) 08/28/2021    Dyshidrotic eczema 04/29/2021   Comprehensive diabetic foot examination, type 2 DM, encounter for ([place]) 04/29/2021   Chronic, continuous use of opioids 03/04/2021   Diabetes ([place]) 11/08/2020   Hypertension 11/08/2020   Muscle pain 04/29/2020   Cancer (HCC)    Cerebrovascular disease    Chronic kidney disease    Dysrhythmia    Heart murmur    Osteoarthritis of left knee    Sleep apnea    S/P insertion of spinal cord stimulator 07/13/2019   Preop cardiovascular exam 05/01/2019   Pain management contract agreement 04/13/2019   Spondylolisthesis 03/20/2019   Lumbar spondylosis 03/20/2019   Spinal stenosis of lumbar region 03/20/2019   Spondylosis of lumbosacral region without myelopathy or radiculopathy 01/30/2019   DDD (degenerative disc disease), lumbar 11/17/2018   Facet joint disease 11/17/2018   History of knee replacement, total, bilateral 11/17/2018   Type 2 diabetes mellitus ([place]) 09/28/2018   Hypertensive disorder 09/28/2018   Chronic obstructive lung disease ([place]) 09/28/2018   Increased body mass index 09/28/2018   Smoker 09/28/2018   Chronic renal failure 09/28/2018   Peripheral vascular disease ([place]) 09/28/2018   Chronic atrial fibrillation ([place]) 09/28/2018   Chronic bilateral low back pain without sciatica 03/08/2018   Chronic pain syndrome 03/08/2018   Facet arthritis of lumbar region 03/08/2018  Foraminal stenosis of lumbar region 03/08/2018   Lumbar radiculopathy 03/08/2018   Spinal stenosis of lumbar region with neurogenic claudication 03/08/2018   CAD (coronary artery disease) 11/04/2016   Essential hypertension 11/04/2016   Diabetes mellitus due to underlying condition with unspecified complications ([place]) 11/16/8869   Mixed dyslipidemia 11/04/2016   Morbid obesity ([place]) 11/04/2016   PAF (paroxysmal atrial fibrillation) ([place]) 04/30/2015   Renal insufficiency 04/30/2015   CKD (chronic kidney disease) stage 3, GFR 30-59 ml/min (HCC)    PCP:  Nicoletta Dress, MD Pharmacy:   [id number] [place], [place] - [address] [facility] [address]Phone: [phone]: [phone]    Social Determinants of Health (SDOH) Interventions    Readmission Risk Interventions     View : No data to display.

## 2021-08-29 NOTE — Evaluation (Signed)
Physical Therapy Evaluation Patient Details Name: Lisa Maxwell MRN: [id number] DOB: 1945/10/21 Today's Date: 08/29/2021  History of Present Illness  76 yo female s/p L2-3 lami with decompression on 5/25. PMH including CAD, a-fib, CKD, DDD, deabetes, HTN,  OA L knee, s/p spinal cord stimulator, bil TKA, and PVD.  Clinical Impression  Patient admitted following above procedure. PTA, patient lives with her husband who assists with ADLs and uses RW for short mobility but primarily utilizes a w/c for mobility in and out of the house. Patient currently with bilateral foot drop (R>L but R seems to be improving since sx). Patient currently requires min guard for sit to stand and short ambulation with RW. Educated patient on back precautions, brace wear, and activity progression, patient verbalized understanding. Patient will benefit from skilled PT services during acute stay to address listed deficits. Recommend HHPT at this time to maximize functional mobility. Patient stating she prefers OPPT/OT as she has recently been to a clinic near her home for her shoulder injury and would like to return there.      Recommendations for follow up therapy are one component of a multi-disciplinary discharge planning process, led by the attending physician.  Recommendations may be updated based on patient status, additional functional criteria and insurance authorization.  Follow Up Recommendations Home health PT (Patient may prefer OPPT as she has attended prior due to shoulder injury)    Assistance Recommended at Discharge Frequent or constant Supervision/Assistance  Patient can return home with the following       Equipment Recommendations None recommended by PT  Recommendations for Other Services       Functional Status Assessment Patient has had a recent decline in their functional status and demonstrates the ability to make significant improvements in function in a reasonable and predictable amount of  time.     Precautions / Restrictions Precautions Precautions: Back Precaution Booklet Issued: Yes (comment) Precaution Comments: reviewed back precautions and compensatory techniques Required Braces or Orthoses: Spinal Brace Spinal Brace: Applied in sitting position;Lumbar corset Restrictions Weight Bearing Restrictions: No      Mobility  Bed Mobility Overal bed mobility: Needs Assistance Bed Mobility: Rolling, Sidelying to Sit Rolling: Min assist Sidelying to sit: Min guard       General bed mobility comments: Min A for guiding hips during roll. Min guard A for safety with power up into sitting    Transfers Overall transfer level: Needs assistance Equipment used: Rolling Aras Albarran (2 wheels) Transfers: Sit to/from Stand Sit to Stand: Min guard           General transfer comment: Min Guard A for safety. Pt supporting herself with BLEs against EOB during power up. Use of momentum to come into standing x 4 during session    Ambulation/Gait Ambulation/Gait assistance: Min guard Gait Distance (Feet): 20 Feet Assistive device: Rolling Jenan Ellegood (2 wheels) Gait Pattern/deviations: Step-through pattern, Decreased stride length, Decreased dorsiflexion - right, Decreased dorsiflexion - left, Steppage, Trunk flexed Gait velocity: decreased     General Gait Details: steppage gait pattern due to bilateral DF weakness  Stairs            Wheelchair Mobility    Modified Rankin (Stroke Patients Only)       Balance Overall balance assessment: Needs assistance Sitting-balance support: No upper extremity supported, Feet supported Sitting balance-Leahy Scale: Good     Standing balance support: No upper extremity supported, During functional activity, Bilateral upper extremity supported Standing balance-Leahy Scale: Fair  Pertinent Vitals/Pain Pain Assessment Pain Assessment: Faces Faces Pain Scale: Hurts little more Pain  Location: Back Pain Descriptors / Indicators: Sore Pain Intervention(s): Monitored during session, Repositioned, RN gave pain meds during session, Limited activity within patient's tolerance    Home Living Family/patient expects to be discharged to:: Private residence Living Arrangements: Spouse/significant other Available Help at Discharge: Family;Available 24 hours/day Type of Home: House Home Access: Ramped entrance     Alternate Level Stairs-Number of Steps: 4 steps Home Layout: Two level Home Equipment: Rolling Leiani Enright (2 wheels);Rollator (4 wheels);BSC/3in1 Additional Comments: Goes to niece's house for shower once a week    Prior Function Prior Level of Function : Needs assist             Mobility Comments: Mainly uses w/c. short distances with Zeth Buday ADLs Comments: Reports she does her ADLs. Husband does IADLs     Hand Dominance   Dominant Hand: Right    Extremity/Trunk Assessment   Upper Extremity Assessment Upper Extremity Assessment: Defer to OT evaluation LUE Deficits / Details: Baseline shoulder injury. Tremors and limited shoulder ROM LUE Coordination: decreased gross motor    Lower Extremity Assessment Lower Extremity Assessment: RLE deficits/detail;LLE deficits/detail RLE Deficits / Details: baseline foot drop but patient reports improved since sx RLE Sensation: decreased light touch RLE Coordination: decreased fine motor LLE Deficits / Details: Mild foot drop 2-/5 LLE Sensation: decreased light touch LLE Coordination: decreased fine motor;decreased gross motor    Cervical / Trunk Assessment Cervical / Trunk Assessment: Back Surgery  Communication   Communication: No difficulties  Cognition Arousal/Alertness: Awake/alert Behavior During Therapy: WFL for tasks assessed/performed Overall Cognitive Status: Within Functional Limits for tasks assessed                                          General Comments      Exercises      Assessment/Plan    PT Assessment Patient needs continued PT services  PT Problem List Decreased strength;Decreased activity tolerance;Decreased balance;Decreased mobility;Decreased coordination;Decreased knowledge of use of DME;Decreased knowledge of precautions       PT Treatment Interventions DME instruction;Gait training;Therapeutic activities;Therapeutic exercise;Balance training;Functional mobility training;Patient/family education    PT Goals (Current goals can be found in the Care Plan section)  Acute Rehab PT Goals Patient Stated Goal: to have more therapy PT Goal Formulation: With patient Time For Goal Achievement: 09/12/21 Potential to Achieve Goals: Good    Frequency Min 5X/week     Co-evaluation PT/OT/SLP Co-Evaluation/Treatment: Yes Reason for Co-Treatment: For patient/therapist safety;To address functional/ADL transfers (activity tolerance) PT goals addressed during session: Balance;Mobility/safety with mobility OT goals addressed during session: ADL's and self-care       AM-PAC PT "6 Clicks" Mobility  Outcome Measure Help needed turning from your back to your side while in a flat bed without using bedrails?: A Little Help needed moving from lying on your back to sitting on the side of a flat bed without using bedrails?: A Little Help needed moving to and from a bed to a chair (including a wheelchair)?: A Little Help needed standing up from a chair using your arms (e.g., wheelchair or bedside chair)?: A Little Help needed to walk in hospital room?: A Little Help needed climbing 3-5 steps with a railing? : Total 6 Click Score: 16    End of Session Equipment Utilized During Treatment: Back brace Activity Tolerance: Patient tolerated  treatment well Patient left: in chair;with call bell/phone within reach Nurse Communication: Mobility status PT Visit Diagnosis: Unsteadiness on feet (R26.81);Muscle weakness (generalized) (M62.81);Difficulty in walking, not  elsewhere classified (R26.2)    Time: 8682-5749 PT Time Calculation (min) (ACUTE ONLY): 40 min   Charges:   PT Evaluation $PT Eval Moderate Complexity: 1 Mod          Kuba Shepherd A. Gilford Rile PT, DPT Acute Rehabilitation Services Pager [phone] Office [phone]   Linna Hoff 08/29/2021, 9:52 AM

## 2021-08-29 NOTE — Progress Notes (Signed)
Patient awaiting transport to her vehicle for discharge home; in no acute distress nor complaints of pain nor discomfort; moves all extremities well; incision on her lower back with honeycomb dressing and is clean, dry and intact; room was checked for all her belongings and was taken along with her; discharge instructions concerning her medications, incision care, follow up appointment and when to call the doctor as needed were all discussed with patient by RN and she expressed understanding on the instructions given.

## 2021-08-29 NOTE — Plan of Care (Signed)
  Problem: Education: Goal: Ability to verbalize activity precautions or restrictions will improve Outcome: Completed/Met Goal: Knowledge of the prescribed therapeutic regimen will improve Outcome: Completed/Met Goal: Understanding of discharge needs will improve Outcome: Completed/Met   Problem: Activity: Goal: Ability to avoid complications of mobility impairment will improve Outcome: Completed/Met Goal: Ability to tolerate increased activity will improve Outcome: Completed/Met Goal: Will remain free from falls Outcome: Completed/Met   Problem: Bowel/Gastric: Goal: Gastrointestinal status for postoperative course will improve Outcome: Completed/Met   Problem: Clinical Measurements: Goal: Ability to maintain clinical measurements within normal limits will improve Outcome: Completed/Met Goal: Postoperative complications will be avoided or minimized Outcome: Completed/Met Goal: Diagnostic test results will improve Outcome: Completed/Met   Problem: Pain Management: Goal: Pain level will decrease Outcome: Completed/Met   Problem: Skin Integrity: Goal: Will show signs of wound healing Outcome: Completed/Met   Problem: Health Behavior/Discharge Planning: Goal: Identification of resources available to assist in meeting health care needs will improve Outcome: Completed/Met   Problem: Bladder/Genitourinary: Goal: Urinary functional status for postoperative course will improve Outcome: Completed/Met   Problem: Safety: Goal: Ability to remain free from injury will improve Outcome: Completed/Met

## 2021-09-02 DIAGNOSIS — N390 Urinary tract infection, site not specified: Secondary | ICD-10-CM | POA: Diagnosis not present

## 2021-09-02 DIAGNOSIS — R339 Retention of urine, unspecified: Secondary | ICD-10-CM | POA: Diagnosis not present

## 2021-09-02 DIAGNOSIS — Z789 Other specified health status: Secondary | ICD-10-CM | POA: Diagnosis not present

## 2021-09-04 DIAGNOSIS — H18513 Endothelial corneal dystrophy, bilateral: Secondary | ICD-10-CM | POA: Diagnosis not present

## 2021-09-04 DIAGNOSIS — H04123 Dry eye syndrome of bilateral lacrimal glands: Secondary | ICD-10-CM | POA: Diagnosis not present

## 2021-09-04 DIAGNOSIS — H40013 Open angle with borderline findings, low risk, bilateral: Secondary | ICD-10-CM | POA: Diagnosis not present

## 2021-09-04 DIAGNOSIS — Z961 Presence of intraocular lens: Secondary | ICD-10-CM | POA: Diagnosis not present

## 2021-09-04 DIAGNOSIS — H1045 Other chronic allergic conjunctivitis: Secondary | ICD-10-CM | POA: Diagnosis not present

## 2021-09-04 DIAGNOSIS — Q141 Congenital malformation of retina: Secondary | ICD-10-CM | POA: Diagnosis not present

## 2021-09-04 DIAGNOSIS — E119 Type 2 diabetes mellitus without complications: Secondary | ICD-10-CM | POA: Diagnosis not present

## 2021-09-05 DIAGNOSIS — Z6832 Body mass index (BMI) 32.0-32.9, adult: Secondary | ICD-10-CM | POA: Diagnosis not present

## 2021-09-05 DIAGNOSIS — G834 Cauda equina syndrome: Secondary | ICD-10-CM | POA: Diagnosis not present

## 2021-09-16 DIAGNOSIS — R2689 Other abnormalities of gait and mobility: Secondary | ICD-10-CM | POA: Diagnosis not present

## 2021-09-16 DIAGNOSIS — M5459 Other low back pain: Secondary | ICD-10-CM | POA: Diagnosis not present

## 2021-09-16 DIAGNOSIS — Z981 Arthrodesis status: Secondary | ICD-10-CM | POA: Diagnosis not present

## 2021-09-16 DIAGNOSIS — M21371 Foot drop, right foot: Secondary | ICD-10-CM | POA: Diagnosis not present

## 2021-09-18 DIAGNOSIS — Z981 Arthrodesis status: Secondary | ICD-10-CM | POA: Diagnosis not present

## 2021-09-18 DIAGNOSIS — M5459 Other low back pain: Secondary | ICD-10-CM | POA: Diagnosis not present

## 2021-09-18 DIAGNOSIS — M21371 Foot drop, right foot: Secondary | ICD-10-CM | POA: Diagnosis not present

## 2021-09-18 DIAGNOSIS — R2689 Other abnormalities of gait and mobility: Secondary | ICD-10-CM | POA: Diagnosis not present

## 2021-09-22 DIAGNOSIS — R2689 Other abnormalities of gait and mobility: Secondary | ICD-10-CM | POA: Diagnosis not present

## 2021-09-22 DIAGNOSIS — M5459 Other low back pain: Secondary | ICD-10-CM | POA: Diagnosis not present

## 2021-09-22 DIAGNOSIS — M21371 Foot drop, right foot: Secondary | ICD-10-CM | POA: Diagnosis not present

## 2021-09-22 DIAGNOSIS — M48062 Spinal stenosis, lumbar region with neurogenic claudication: Secondary | ICD-10-CM | POA: Diagnosis not present

## 2021-09-22 DIAGNOSIS — Z981 Arthrodesis status: Secondary | ICD-10-CM | POA: Diagnosis not present

## 2021-09-23 DIAGNOSIS — G8929 Other chronic pain: Secondary | ICD-10-CM | POA: Diagnosis not present

## 2021-09-23 DIAGNOSIS — M5136 Other intervertebral disc degeneration, lumbar region: Secondary | ICD-10-CM | POA: Diagnosis not present

## 2021-09-23 DIAGNOSIS — F1190 Opioid use, unspecified, uncomplicated: Secondary | ICD-10-CM | POA: Diagnosis not present

## 2021-09-23 DIAGNOSIS — M48062 Spinal stenosis, lumbar region with neurogenic claudication: Secondary | ICD-10-CM | POA: Diagnosis not present

## 2021-09-23 DIAGNOSIS — M25512 Pain in left shoulder: Secondary | ICD-10-CM | POA: Diagnosis not present

## 2021-09-23 DIAGNOSIS — Z9689 Presence of other specified functional implants: Secondary | ICD-10-CM | POA: Diagnosis not present

## 2021-09-23 DIAGNOSIS — Z6834 Body mass index (BMI) 34.0-34.9, adult: Secondary | ICD-10-CM | POA: Diagnosis not present

## 2021-09-23 DIAGNOSIS — E6609 Other obesity due to excess calories: Secondary | ICD-10-CM | POA: Diagnosis not present

## 2021-09-25 DIAGNOSIS — R2689 Other abnormalities of gait and mobility: Secondary | ICD-10-CM | POA: Diagnosis not present

## 2021-09-25 DIAGNOSIS — M21371 Foot drop, right foot: Secondary | ICD-10-CM | POA: Diagnosis not present

## 2021-09-25 DIAGNOSIS — Z981 Arthrodesis status: Secondary | ICD-10-CM | POA: Diagnosis not present

## 2021-09-25 DIAGNOSIS — M5459 Other low back pain: Secondary | ICD-10-CM | POA: Diagnosis not present

## 2021-09-29 DIAGNOSIS — M5459 Other low back pain: Secondary | ICD-10-CM | POA: Diagnosis not present

## 2021-09-29 DIAGNOSIS — M21371 Foot drop, right foot: Secondary | ICD-10-CM | POA: Diagnosis not present

## 2021-09-29 DIAGNOSIS — R2689 Other abnormalities of gait and mobility: Secondary | ICD-10-CM | POA: Diagnosis not present

## 2021-09-29 DIAGNOSIS — Z981 Arthrodesis status: Secondary | ICD-10-CM | POA: Diagnosis not present

## 2021-10-01 DIAGNOSIS — R2689 Other abnormalities of gait and mobility: Secondary | ICD-10-CM | POA: Diagnosis not present

## 2021-10-01 DIAGNOSIS — M21371 Foot drop, right foot: Secondary | ICD-10-CM | POA: Diagnosis not present

## 2021-10-01 DIAGNOSIS — Z981 Arthrodesis status: Secondary | ICD-10-CM | POA: Diagnosis not present

## 2021-10-01 DIAGNOSIS — M5459 Other low back pain: Secondary | ICD-10-CM | POA: Diagnosis not present

## 2021-10-06 DIAGNOSIS — M5459 Other low back pain: Secondary | ICD-10-CM | POA: Diagnosis not present

## 2021-10-06 DIAGNOSIS — R2689 Other abnormalities of gait and mobility: Secondary | ICD-10-CM | POA: Diagnosis not present

## 2021-10-06 DIAGNOSIS — Z981 Arthrodesis status: Secondary | ICD-10-CM | POA: Diagnosis not present

## 2021-10-06 DIAGNOSIS — M21371 Foot drop, right foot: Secondary | ICD-10-CM | POA: Diagnosis not present

## 2021-10-09 DIAGNOSIS — R2689 Other abnormalities of gait and mobility: Secondary | ICD-10-CM | POA: Diagnosis not present

## 2021-10-09 DIAGNOSIS — M5459 Other low back pain: Secondary | ICD-10-CM | POA: Diagnosis not present

## 2021-10-09 DIAGNOSIS — M21371 Foot drop, right foot: Secondary | ICD-10-CM | POA: Diagnosis not present

## 2021-10-09 DIAGNOSIS — Z981 Arthrodesis status: Secondary | ICD-10-CM | POA: Diagnosis not present

## 2021-10-13 DIAGNOSIS — Z981 Arthrodesis status: Secondary | ICD-10-CM | POA: Diagnosis not present

## 2021-10-13 DIAGNOSIS — M5459 Other low back pain: Secondary | ICD-10-CM | POA: Diagnosis not present

## 2021-10-13 DIAGNOSIS — M21371 Foot drop, right foot: Secondary | ICD-10-CM | POA: Diagnosis not present

## 2021-10-13 DIAGNOSIS — R2689 Other abnormalities of gait and mobility: Secondary | ICD-10-CM | POA: Diagnosis not present

## 2021-10-15 DIAGNOSIS — R2689 Other abnormalities of gait and mobility: Secondary | ICD-10-CM | POA: Diagnosis not present

## 2021-10-15 DIAGNOSIS — M21371 Foot drop, right foot: Secondary | ICD-10-CM | POA: Diagnosis not present

## 2021-10-15 DIAGNOSIS — M5459 Other low back pain: Secondary | ICD-10-CM | POA: Diagnosis not present

## 2021-10-15 DIAGNOSIS — Z981 Arthrodesis status: Secondary | ICD-10-CM | POA: Diagnosis not present

## 2021-10-20 DIAGNOSIS — M5459 Other low back pain: Secondary | ICD-10-CM | POA: Diagnosis not present

## 2021-10-20 DIAGNOSIS — M21371 Foot drop, right foot: Secondary | ICD-10-CM | POA: Diagnosis not present

## 2021-10-20 DIAGNOSIS — R2689 Other abnormalities of gait and mobility: Secondary | ICD-10-CM | POA: Diagnosis not present

## 2021-10-20 DIAGNOSIS — Z981 Arthrodesis status: Secondary | ICD-10-CM | POA: Diagnosis not present

## 2021-10-23 DIAGNOSIS — M21371 Foot drop, right foot: Secondary | ICD-10-CM | POA: Diagnosis not present

## 2021-10-23 DIAGNOSIS — R2689 Other abnormalities of gait and mobility: Secondary | ICD-10-CM | POA: Diagnosis not present

## 2021-10-23 DIAGNOSIS — Z981 Arthrodesis status: Secondary | ICD-10-CM | POA: Diagnosis not present

## 2021-10-23 DIAGNOSIS — M5459 Other low back pain: Secondary | ICD-10-CM | POA: Diagnosis not present

## 2021-10-29 DIAGNOSIS — M21371 Foot drop, right foot: Secondary | ICD-10-CM | POA: Diagnosis not present

## 2021-10-29 DIAGNOSIS — M5459 Other low back pain: Secondary | ICD-10-CM | POA: Diagnosis not present

## 2021-10-29 DIAGNOSIS — Z981 Arthrodesis status: Secondary | ICD-10-CM | POA: Diagnosis not present

## 2021-10-29 DIAGNOSIS — R2689 Other abnormalities of gait and mobility: Secondary | ICD-10-CM | POA: Diagnosis not present

## 2021-11-03 DIAGNOSIS — M21371 Foot drop, right foot: Secondary | ICD-10-CM | POA: Diagnosis not present

## 2021-11-03 DIAGNOSIS — M5459 Other low back pain: Secondary | ICD-10-CM | POA: Diagnosis not present

## 2021-11-03 DIAGNOSIS — Z981 Arthrodesis status: Secondary | ICD-10-CM | POA: Diagnosis not present

## 2021-11-03 DIAGNOSIS — R2689 Other abnormalities of gait and mobility: Secondary | ICD-10-CM | POA: Diagnosis not present

## 2021-11-05 DIAGNOSIS — M5459 Other low back pain: Secondary | ICD-10-CM | POA: Diagnosis not present

## 2021-11-05 DIAGNOSIS — R2689 Other abnormalities of gait and mobility: Secondary | ICD-10-CM | POA: Diagnosis not present

## 2021-11-05 DIAGNOSIS — M21371 Foot drop, right foot: Secondary | ICD-10-CM | POA: Diagnosis not present

## 2021-11-05 DIAGNOSIS — Z981 Arthrodesis status: Secondary | ICD-10-CM | POA: Diagnosis not present

## 2021-11-06 DIAGNOSIS — N390 Urinary tract infection, site not specified: Secondary | ICD-10-CM | POA: Diagnosis not present

## 2021-11-06 DIAGNOSIS — R339 Retention of urine, unspecified: Secondary | ICD-10-CM | POA: Diagnosis not present

## 2021-11-07 DIAGNOSIS — I1310 Hypertensive heart and chronic kidney disease without heart failure, with stage 1 through stage 4 chronic kidney disease, or unspecified chronic kidney disease: Secondary | ICD-10-CM | POA: Diagnosis not present

## 2021-11-07 DIAGNOSIS — D638 Anemia in other chronic diseases classified elsewhere: Secondary | ICD-10-CM | POA: Diagnosis not present

## 2021-11-07 DIAGNOSIS — E782 Mixed hyperlipidemia: Secondary | ICD-10-CM | POA: Diagnosis not present

## 2021-11-07 DIAGNOSIS — E1122 Type 2 diabetes mellitus with diabetic chronic kidney disease: Secondary | ICD-10-CM | POA: Diagnosis not present

## 2021-11-07 DIAGNOSIS — N183 Chronic kidney disease, stage 3 unspecified: Secondary | ICD-10-CM | POA: Diagnosis not present

## 2021-11-07 DIAGNOSIS — J449 Chronic obstructive pulmonary disease, unspecified: Secondary | ICD-10-CM | POA: Diagnosis not present

## 2021-11-07 DIAGNOSIS — E1129 Type 2 diabetes mellitus with other diabetic kidney complication: Secondary | ICD-10-CM | POA: Diagnosis not present

## 2021-11-07 DIAGNOSIS — G8929 Other chronic pain: Secondary | ICD-10-CM | POA: Diagnosis not present

## 2021-11-07 DIAGNOSIS — M549 Dorsalgia, unspecified: Secondary | ICD-10-CM | POA: Diagnosis not present

## 2021-11-10 DIAGNOSIS — M5459 Other low back pain: Secondary | ICD-10-CM | POA: Diagnosis not present

## 2021-11-10 DIAGNOSIS — Z981 Arthrodesis status: Secondary | ICD-10-CM | POA: Diagnosis not present

## 2021-11-10 DIAGNOSIS — R2689 Other abnormalities of gait and mobility: Secondary | ICD-10-CM | POA: Diagnosis not present

## 2021-11-10 DIAGNOSIS — M21371 Foot drop, right foot: Secondary | ICD-10-CM | POA: Diagnosis not present

## 2021-11-12 DIAGNOSIS — M21371 Foot drop, right foot: Secondary | ICD-10-CM | POA: Diagnosis not present

## 2021-11-12 DIAGNOSIS — R2689 Other abnormalities of gait and mobility: Secondary | ICD-10-CM | POA: Diagnosis not present

## 2021-11-12 DIAGNOSIS — Z981 Arthrodesis status: Secondary | ICD-10-CM | POA: Diagnosis not present

## 2021-11-12 DIAGNOSIS — M5459 Other low back pain: Secondary | ICD-10-CM | POA: Diagnosis not present

## 2021-11-17 ENCOUNTER — Ambulatory Visit: Payer: Medicare HMO | Admitting: Cardiology

## 2021-11-17 DIAGNOSIS — M5459 Other low back pain: Secondary | ICD-10-CM | POA: Diagnosis not present

## 2021-11-17 DIAGNOSIS — R911 Solitary pulmonary nodule: Secondary | ICD-10-CM | POA: Diagnosis not present

## 2021-11-17 DIAGNOSIS — R2689 Other abnormalities of gait and mobility: Secondary | ICD-10-CM | POA: Diagnosis not present

## 2021-11-17 DIAGNOSIS — M21371 Foot drop, right foot: Secondary | ICD-10-CM | POA: Diagnosis not present

## 2021-11-17 DIAGNOSIS — J4530 Mild persistent asthma, uncomplicated: Secondary | ICD-10-CM | POA: Diagnosis not present

## 2021-11-17 DIAGNOSIS — G4733 Obstructive sleep apnea (adult) (pediatric): Secondary | ICD-10-CM | POA: Diagnosis not present

## 2021-11-17 DIAGNOSIS — Z981 Arthrodesis status: Secondary | ICD-10-CM | POA: Diagnosis not present

## 2021-11-19 DIAGNOSIS — N200 Calculus of kidney: Secondary | ICD-10-CM | POA: Diagnosis not present

## 2021-11-19 DIAGNOSIS — N261 Atrophy of kidney (terminal): Secondary | ICD-10-CM | POA: Diagnosis not present

## 2021-11-19 DIAGNOSIS — I700 Atherosclerosis of aorta: Secondary | ICD-10-CM | POA: Diagnosis not present

## 2021-11-19 DIAGNOSIS — N2889 Other specified disorders of kidney and ureter: Secondary | ICD-10-CM | POA: Diagnosis not present

## 2021-11-19 DIAGNOSIS — I2510 Atherosclerotic heart disease of native coronary artery without angina pectoris: Secondary | ICD-10-CM | POA: Diagnosis not present

## 2021-11-19 DIAGNOSIS — K5730 Diverticulosis of large intestine without perforation or abscess without bleeding: Secondary | ICD-10-CM | POA: Diagnosis not present

## 2021-11-20 DIAGNOSIS — R2689 Other abnormalities of gait and mobility: Secondary | ICD-10-CM | POA: Diagnosis not present

## 2021-11-20 DIAGNOSIS — Z981 Arthrodesis status: Secondary | ICD-10-CM | POA: Diagnosis not present

## 2021-11-20 DIAGNOSIS — M21371 Foot drop, right foot: Secondary | ICD-10-CM | POA: Diagnosis not present

## 2021-11-20 DIAGNOSIS — M5459 Other low back pain: Secondary | ICD-10-CM | POA: Diagnosis not present

## 2021-11-21 ENCOUNTER — Emergency Department (HOSPITAL_COMMUNITY): Payer: Medicare HMO

## 2021-11-21 ENCOUNTER — Encounter (HOSPITAL_COMMUNITY): Payer: Self-pay | Admitting: Radiology

## 2021-11-21 ENCOUNTER — Inpatient Hospital Stay (HOSPITAL_COMMUNITY): Payer: Medicare HMO

## 2021-11-21 ENCOUNTER — Inpatient Hospital Stay (HOSPITAL_COMMUNITY): Payer: Medicare HMO | Admitting: Certified Registered Nurse Anesthetist

## 2021-11-21 ENCOUNTER — Inpatient Hospital Stay (HOSPITAL_COMMUNITY)
Admission: EM | Admit: 2021-11-21 | Discharge: 2021-11-24 | DRG: 163 | Disposition: A | Discharge status: Home / Self Care | Payer: Medicare HMO | Attending: Internal Medicine | Admitting: Internal Medicine

## 2021-11-21 ENCOUNTER — Encounter (HOSPITAL_COMMUNITY)
Admission: EM | Disposition: A | Discharge status: Home / Self Care | Payer: Self-pay | Source: Home / Self Care | Attending: Internal Medicine

## 2021-11-21 ENCOUNTER — Other Ambulatory Visit: Payer: Self-pay

## 2021-11-21 DIAGNOSIS — I2699 Other pulmonary embolism without acute cor pulmonale: Secondary | ICD-10-CM

## 2021-11-21 DIAGNOSIS — M549 Dorsalgia, unspecified: Secondary | ICD-10-CM | POA: Diagnosis present

## 2021-11-21 DIAGNOSIS — I4891 Unspecified atrial fibrillation: Secondary | ICD-10-CM

## 2021-11-21 DIAGNOSIS — L89322 Pressure ulcer of left buttock, stage 2: Secondary | ICD-10-CM | POA: Diagnosis not present

## 2021-11-21 DIAGNOSIS — J9601 Acute respiratory failure with hypoxia: Secondary | ICD-10-CM | POA: Diagnosis present

## 2021-11-21 DIAGNOSIS — Z87891 Personal history of nicotine dependence: Secondary | ICD-10-CM

## 2021-11-21 DIAGNOSIS — R0689 Other abnormalities of breathing: Secondary | ICD-10-CM | POA: Diagnosis not present

## 2021-11-21 DIAGNOSIS — N179 Acute kidney failure, unspecified: Secondary | ICD-10-CM | POA: Diagnosis not present

## 2021-11-21 DIAGNOSIS — D696 Thrombocytopenia, unspecified: Secondary | ICD-10-CM | POA: Diagnosis present

## 2021-11-21 DIAGNOSIS — Z20822 Contact with and (suspected) exposure to covid-19: Secondary | ICD-10-CM | POA: Diagnosis not present

## 2021-11-21 DIAGNOSIS — N1832 Chronic kidney disease, stage 3b: Secondary | ICD-10-CM | POA: Diagnosis present

## 2021-11-21 DIAGNOSIS — Z981 Arthrodesis status: Secondary | ICD-10-CM | POA: Diagnosis not present

## 2021-11-21 DIAGNOSIS — E1151 Type 2 diabetes mellitus with diabetic peripheral angiopathy without gangrene: Secondary | ICD-10-CM | POA: Diagnosis present

## 2021-11-21 DIAGNOSIS — R296 Repeated falls: Secondary | ICD-10-CM | POA: Diagnosis present

## 2021-11-21 DIAGNOSIS — E1122 Type 2 diabetes mellitus with diabetic chronic kidney disease: Secondary | ICD-10-CM | POA: Diagnosis present

## 2021-11-21 DIAGNOSIS — L8990 Pressure ulcer of unspecified site, unspecified stage: Secondary | ICD-10-CM | POA: Insufficient documentation

## 2021-11-21 DIAGNOSIS — I482 Chronic atrial fibrillation, unspecified: Secondary | ICD-10-CM | POA: Diagnosis present

## 2021-11-21 DIAGNOSIS — G4733 Obstructive sleep apnea (adult) (pediatric): Secondary | ICD-10-CM | POA: Diagnosis present

## 2021-11-21 DIAGNOSIS — G894 Chronic pain syndrome: Secondary | ICD-10-CM | POA: Diagnosis present

## 2021-11-21 DIAGNOSIS — G9341 Metabolic encephalopathy: Secondary | ICD-10-CM | POA: Diagnosis present

## 2021-11-21 DIAGNOSIS — Z8673 Personal history of transient ischemic attack (TIA), and cerebral infarction without residual deficits: Secondary | ICD-10-CM

## 2021-11-21 DIAGNOSIS — J449 Chronic obstructive pulmonary disease, unspecified: Secondary | ICD-10-CM

## 2021-11-21 DIAGNOSIS — I248 Other forms of acute ischemic heart disease: Secondary | ICD-10-CM | POA: Diagnosis not present

## 2021-11-21 DIAGNOSIS — D649 Anemia, unspecified: Secondary | ICD-10-CM | POA: Diagnosis present

## 2021-11-21 DIAGNOSIS — J439 Emphysema, unspecified: Secondary | ICD-10-CM | POA: Diagnosis not present

## 2021-11-21 DIAGNOSIS — E860 Dehydration: Secondary | ICD-10-CM | POA: Diagnosis present

## 2021-11-21 DIAGNOSIS — Z96653 Presence of artificial knee joint, bilateral: Secondary | ICD-10-CM | POA: Diagnosis present

## 2021-11-21 DIAGNOSIS — Z79899 Other long term (current) drug therapy: Secondary | ICD-10-CM

## 2021-11-21 DIAGNOSIS — R188 Other ascites: Secondary | ICD-10-CM | POA: Diagnosis not present

## 2021-11-21 DIAGNOSIS — I2692 Saddle embolus of pulmonary artery without acute cor pulmonale: Secondary | ICD-10-CM

## 2021-11-21 DIAGNOSIS — M7989 Other specified soft tissue disorders: Secondary | ICD-10-CM | POA: Diagnosis not present

## 2021-11-21 DIAGNOSIS — I771 Stricture of artery: Secondary | ICD-10-CM | POA: Diagnosis not present

## 2021-11-21 DIAGNOSIS — I272 Pulmonary hypertension, unspecified: Secondary | ICD-10-CM | POA: Diagnosis not present

## 2021-11-21 DIAGNOSIS — Z85828 Personal history of other malignant neoplasm of skin: Secondary | ICD-10-CM

## 2021-11-21 DIAGNOSIS — Z7982 Long term (current) use of aspirin: Secondary | ICD-10-CM

## 2021-11-21 DIAGNOSIS — R29810 Facial weakness: Secondary | ICD-10-CM | POA: Diagnosis not present

## 2021-11-21 DIAGNOSIS — I2602 Saddle embolus of pulmonary artery with acute cor pulmonale: Secondary | ICD-10-CM | POA: Diagnosis not present

## 2021-11-21 DIAGNOSIS — I2721 Secondary pulmonary arterial hypertension: Secondary | ICD-10-CM | POA: Diagnosis present

## 2021-11-21 DIAGNOSIS — Z6837 Body mass index (BMI) 37.0-37.9, adult: Secondary | ICD-10-CM

## 2021-11-21 DIAGNOSIS — E1165 Type 2 diabetes mellitus with hyperglycemia: Secondary | ICD-10-CM | POA: Diagnosis present

## 2021-11-21 DIAGNOSIS — I10 Essential (primary) hypertension: Secondary | ICD-10-CM | POA: Diagnosis not present

## 2021-11-21 DIAGNOSIS — R404 Transient alteration of awareness: Secondary | ICD-10-CM | POA: Diagnosis not present

## 2021-11-21 DIAGNOSIS — I2510 Atherosclerotic heart disease of native coronary artery without angina pectoris: Secondary | ICD-10-CM | POA: Diagnosis present

## 2021-11-21 DIAGNOSIS — I639 Cerebral infarction, unspecified: Secondary | ICD-10-CM | POA: Diagnosis not present

## 2021-11-21 DIAGNOSIS — Z833 Family history of diabetes mellitus: Secondary | ICD-10-CM

## 2021-11-21 DIAGNOSIS — I82442 Acute embolism and thrombosis of left tibial vein: Secondary | ICD-10-CM | POA: Diagnosis present

## 2021-11-21 DIAGNOSIS — R570 Cardiogenic shock: Secondary | ICD-10-CM | POA: Diagnosis present

## 2021-11-21 DIAGNOSIS — Z7951 Long term (current) use of inhaled steroids: Secondary | ICD-10-CM

## 2021-11-21 DIAGNOSIS — R0902 Hypoxemia: Secondary | ICD-10-CM | POA: Diagnosis not present

## 2021-11-21 DIAGNOSIS — E782 Mixed hyperlipidemia: Secondary | ICD-10-CM | POA: Diagnosis present

## 2021-11-21 DIAGNOSIS — M419 Scoliosis, unspecified: Secondary | ICD-10-CM | POA: Diagnosis present

## 2021-11-21 DIAGNOSIS — I129 Hypertensive chronic kidney disease with stage 1 through stage 4 chronic kidney disease, or unspecified chronic kidney disease: Secondary | ICD-10-CM | POA: Diagnosis present

## 2021-11-21 DIAGNOSIS — G4730 Sleep apnea, unspecified: Secondary | ICD-10-CM | POA: Diagnosis not present

## 2021-11-21 DIAGNOSIS — I6523 Occlusion and stenosis of bilateral carotid arteries: Secondary | ICD-10-CM | POA: Diagnosis not present

## 2021-11-21 HISTORY — PX: RADIOLOGY WITH ANESTHESIA: SHX6223

## 2021-11-21 HISTORY — PX: IR US GUIDE VASC ACCESS RIGHT: IMG2390

## 2021-11-21 HISTORY — PX: IR ANGIOGRAM SELECTIVE EACH ADDITIONAL VESSEL: IMG667

## 2021-11-21 HISTORY — PX: IR ANGIOGRAM PULMONARY BILATERAL SELECTIVE: IMG664

## 2021-11-21 HISTORY — DX: Pressure ulcer of unspecified site, unspecified stage: L89.90

## 2021-11-21 HISTORY — PX: IR THROMBECT PRIM MECH INIT (INCLU) MOD SED: IMG2297

## 2021-11-21 HISTORY — DX: Other pulmonary embolism without acute cor pulmonale: I26.99

## 2021-11-21 LAB — APTT
aPTT: 32 s (ref 24–36)
aPTT: >200 seconds (ref 24–36)
aPTT: 130 seconds — ABNORMAL HIGH (ref 24–36)

## 2021-11-21 LAB — ECHOCARDIOGRAM COMPLETE
AR max vel: 1.94 cm2
AV Peak grad: 3.7 mmHg
Ao pk vel: 0.96 m/s
Area-P 1/2: 4.05 cm2
Height: 56 in
S' Lateral: 2.6 cm
Weight: 2518.54 oz

## 2021-11-21 LAB — CBC
HCT: 47.7 % — ABNORMAL HIGH (ref 36.0–46.0)
HCT: 37.9 % (ref 36.0–46.0)
Hemoglobin: 14.7 g/dL (ref 12.0–15.0)
Hemoglobin: 12.5 g/dL (ref 12.0–15.0)
MCH: 30.8 pg (ref 26.0–34.0)
MCH: 31 pg (ref 26.0–34.0)
MCHC: 30.8 g/dL (ref 30.0–36.0)
MCHC: 33 g/dL (ref 30.0–36.0)
MCV: 100 fL (ref 80.0–100.0)
MCV: 94 fL (ref 80.0–100.0)
Platelets: 139 10*3/uL — ABNORMAL LOW (ref 150–400)
Platelets: 130 10*3/uL — ABNORMAL LOW (ref 150–400)
RBC: 4.77 MIL/uL (ref 3.87–5.11)
RBC: 4.03 MIL/uL (ref 3.87–5.11)
RDW: 14.1 % (ref 11.5–15.5)
RDW: 14 % (ref 11.5–15.5)
WBC: 8.6 10*3/uL (ref 4.0–10.5)
WBC: 6.8 10*3/uL (ref 4.0–10.5)
nRBC: 0 % (ref 0.0–0.2)
nRBC: 0 % (ref 0.0–0.2)

## 2021-11-21 LAB — DIFFERENTIAL
Abs Immature Granulocytes: 0.06 10*3/uL (ref 0.00–0.07)
Basophils Absolute: 0 10*3/uL (ref 0.0–0.1)
Basophils Relative: 0 %
Eosinophils Absolute: 0.2 10*3/uL (ref 0.0–0.5)
Eosinophils Relative: 2 %
Immature Granulocytes: 1 %
Lymphocytes Relative: 20 %
Lymphs Abs: 1.7 10*3/uL (ref 0.7–4.0)
Monocytes Absolute: 0.6 10*3/uL (ref 0.1–1.0)
Monocytes Relative: 7 %
Neutro Abs: 6.1 10*3/uL (ref 1.7–7.7)
Neutrophils Relative %: 70 %

## 2021-11-21 LAB — COMPREHENSIVE METABOLIC PANEL
ALT: 16 U/L (ref 0–44)
AST: 25 U/L (ref 15–41)
Albumin: 3.3 g/dL — ABNORMAL LOW (ref 3.5–5.0)
Alkaline Phosphatase: 54 U/L (ref 38–126)
Anion gap: 12 (ref 5–15)
BUN: 25 mg/dL — ABNORMAL HIGH (ref 8–23)
CO2: 18 mmol/L — ABNORMAL LOW (ref 22–32)
Calcium: 10 mg/dL (ref 8.9–10.3)
Chloride: 106 mmol/L (ref 98–111)
Creatinine, Ser: 1.49 mg/dL — ABNORMAL HIGH (ref 0.44–1.00)
GFR, Estimated: 36 mL/min — ABNORMAL LOW (ref >60)
Glucose, Bld: 157 mg/dL — ABNORMAL HIGH (ref 70–99)
Potassium: 4.3 mmol/L (ref 3.5–5.1)
Sodium: 136 mmol/L (ref 135–145)
Total Bilirubin: 0.7 mg/dL (ref 0.3–1.2)
Total Protein: 5.6 g/dL — ABNORMAL LOW (ref 6.5–8.1)

## 2021-11-21 LAB — I-STAT CHEM 8, ED
BUN: 30 mg/dL — ABNORMAL HIGH (ref 8–23)
Calcium, Ion: 1.21 mmol/L (ref 1.15–1.40)
Chloride: 106 mmol/L (ref 98–111)
Creatinine, Ser: 1.5 mg/dL — ABNORMAL HIGH (ref 0.44–1.00)
Glucose, Bld: 159 mg/dL — ABNORMAL HIGH (ref 70–99)
HCT: 45 % (ref 36.0–46.0)
Hemoglobin: 15.3 g/dL — ABNORMAL HIGH (ref 12.0–15.0)
Potassium: 4.5 mmol/L (ref 3.5–5.1)
Sodium: 137 mmol/L (ref 135–145)
TCO2: 21 mmol/L — ABNORMAL LOW (ref 22–32)

## 2021-11-21 LAB — ETHANOL: Alcohol, Ethyl (B): <10 mg/dL (ref <10)

## 2021-11-21 LAB — HEPARIN LEVEL (UNFRACTIONATED)
Heparin Unfractionated: >1.1 IU/mL — ABNORMAL HIGH (ref 0.30–0.70)
Heparin Unfractionated: 0.5 IU/mL (ref 0.30–0.70)

## 2021-11-21 LAB — RESP PANEL BY RT-PCR (FLU A&B, COVID) ARPGX2
Influenza A by PCR: NEGATIVE
Influenza B by PCR: NEGATIVE
SARS Coronavirus 2 by RT PCR: NEGATIVE

## 2021-11-21 LAB — PROCALCITONIN: Procalcitonin: <0.1 ng/mL

## 2021-11-21 LAB — LACTIC ACID, PLASMA
Lactic Acid, Venous: 1.9 mmol/L (ref 0.5–1.9)
Lactic Acid, Venous: 1 mmol/L (ref 0.5–1.9)
Lactic Acid, Venous: 0.9 mmol/L (ref 0.5–1.9)

## 2021-11-21 LAB — POCT ACTIVATED CLOTTING TIME
Activated Clotting Time: 191 seconds
Activated Clotting Time: 263 seconds

## 2021-11-21 LAB — GLUCOSE, CAPILLARY
Glucose-Capillary: 160 mg/dL — ABNORMAL HIGH (ref 70–99)
Glucose-Capillary: 154 mg/dL — ABNORMAL HIGH (ref 70–99)

## 2021-11-21 LAB — LIPASE, BLOOD: Lipase: 46 U/L (ref 11–51)

## 2021-11-21 LAB — PROTIME-INR
INR: 1.4 — ABNORMAL HIGH (ref 0.8–1.2)
INR: 1.3 — ABNORMAL HIGH (ref 0.8–1.2)
Prothrombin Time: 17.1 seconds — ABNORMAL HIGH (ref 11.4–15.2)
Prothrombin Time: 16.3 seconds — ABNORMAL HIGH (ref 11.4–15.2)

## 2021-11-21 LAB — AMYLASE: Amylase: 57 U/L (ref 28–100)

## 2021-11-21 LAB — TROPONIN I (HIGH SENSITIVITY)
Troponin I (High Sensitivity): 400 ng/L (ref <18)
Troponin I (High Sensitivity): 1422 ng/L (ref <18)

## 2021-11-21 LAB — BRAIN NATRIURETIC PEPTIDE: B Natriuretic Peptide: 101.1 pg/mL — ABNORMAL HIGH (ref 0.0–100.0)

## 2021-11-21 LAB — CORTISOL: Cortisol, Plasma: 22.4 ug/dL

## 2021-11-21 LAB — CBG MONITORING, ED: Glucose-Capillary: 157 mg/dL — ABNORMAL HIGH (ref 70–99)

## 2021-11-21 SURGERY — IR WITH ANESTHESIA
Anesthesia: Monitor Anesthesia Care

## 2021-11-21 MED ORDER — KETAMINE HCL 10 MG/ML IJ SOLN
INTRAMUSCULAR | Status: DC | PRN
  Administered 2021-11-21 (×5): 10 mg via INTRAVENOUS

## 2021-11-21 MED ORDER — SODIUM CHLORIDE 0.9 % IV SOLN: 250.0000 mL | INTRAVENOUS | Status: DC

## 2021-11-21 MED ORDER — FENTANYL CITRATE (PF) 100 MCG/2ML IJ SOLN
INTRAMUSCULAR | Status: AC
  Filled 2021-11-21: qty 2

## 2021-11-21 MED ORDER — ETOMIDATE 2 MG/ML IV SOLN
INTRAVENOUS | Status: AC
  Filled 2021-11-21: qty 10

## 2021-11-21 MED ORDER — NOREPINEPHRINE 4 MG/250ML-% IV SOLN
2.0000 ug/min | INTRAVENOUS | Status: DC
  Administered 2021-11-21: 2 ug/min via INTRAVENOUS
  Filled 2021-11-21: qty 250

## 2021-11-21 MED ORDER — MIDAZOLAM HCL 2 MG/2ML IJ SOLN
INTRAMUSCULAR | Status: DC | PRN
  Administered 2021-11-21 (×4): .5 mg via INTRAVENOUS

## 2021-11-21 MED ORDER — IOHEXOL 350 MG/ML SOLN
50.0000 mL | Freq: Once | INTRAVENOUS | Status: AC | PRN
  Administered 2021-11-21: 50 mL via INTRAVENOUS

## 2021-11-21 MED ORDER — SODIUM CHLORIDE 0.9 % IV BOLUS
1000.0000 mL | Freq: Once | INTRAVENOUS | Status: AC
  Administered 2021-11-21: 1000 mL via INTRAVENOUS

## 2021-11-21 MED ORDER — POLYETHYLENE GLYCOL 3350 17 G PO PACK: 17.0000 g | PACK | Freq: Every day | ORAL | Status: DC | PRN

## 2021-11-21 MED ORDER — IOHEXOL 350 MG/ML SOLN
75.0000 mL | Freq: Once | INTRAVENOUS | Status: AC | PRN
  Administered 2021-11-21: 75 mL via INTRAVENOUS

## 2021-11-21 MED ORDER — HEPARIN SODIUM (PORCINE) 1000 UNIT/ML IJ SOLN
INTRAMUSCULAR | Status: DC | PRN
  Administered 2021-11-21: 2000 [IU] via INTRAVENOUS

## 2021-11-21 MED ORDER — SODIUM CHLORIDE 0.9 % IV SOLN: INTRAVENOUS | Status: DC

## 2021-11-21 MED ORDER — MIDAZOLAM HCL 2 MG/2ML IJ SOLN
INTRAMUSCULAR | Status: AC
  Filled 2021-11-21: qty 2

## 2021-11-21 MED ORDER — HEPARIN BOLUS VIA INFUSION
4000.0000 [IU] | Freq: Once | INTRAVENOUS | Status: AC
  Administered 2021-11-21: 4000 [IU] via INTRAVENOUS
  Filled 2021-11-21: qty 4000

## 2021-11-21 MED ORDER — LACTATED RINGERS IV SOLN: INTRAVENOUS | Status: DC | PRN

## 2021-11-21 MED ORDER — KETAMINE HCL 50 MG/5ML IJ SOSY
PREFILLED_SYRINGE | INTRAMUSCULAR | Status: AC
  Filled 2021-11-21: qty 10

## 2021-11-21 MED ORDER — LACTATED RINGERS IV BOLUS
1000.0000 mL | Freq: Once | INTRAVENOUS | Status: AC
  Administered 2021-11-21: 1000 mL via INTRAVENOUS

## 2021-11-21 MED ORDER — LIDOCAINE HCL 1 % IJ SOLN
INTRAMUSCULAR | Status: AC
  Administered 2021-11-21: 10 mL
  Filled 2021-11-21: qty 20

## 2021-11-21 MED ORDER — VASOPRESSIN 20 UNITS/100 ML INFUSION FOR SHOCK
0.0000 [IU]/min | INTRAVENOUS | Status: DC
  Filled 2021-11-21: qty 100

## 2021-11-21 MED ORDER — IOHEXOL 300 MG/ML  SOLN
100.0000 mL | Freq: Once | INTRAMUSCULAR | Status: AC | PRN
Start: 2021-11-21 — End: 2021-11-21
  Administered 2021-11-21: 80 mL via INTRA_ARTERIAL

## 2021-11-21 MED ORDER — DOCUSATE SODIUM 100 MG PO CAPS: 100.0000 mg | ORAL_CAPSULE | Freq: Two times a day (BID) | ORAL | Status: DC | PRN

## 2021-11-21 MED ORDER — PROPOFOL 10 MG/ML IV BOLUS
INTRAVENOUS | Status: AC
  Filled 2021-11-21: qty 20

## 2021-11-21 MED ORDER — HEPARIN (PORCINE) 25000 UT/250ML-% IV SOLN
950.0000 [IU]/h | INTRAVENOUS | Status: DC
  Administered 2021-11-21: 950 [IU]/h via INTRAVENOUS
  Filled 2021-11-21: qty 250

## 2021-11-21 MED ORDER — FENTANYL CITRATE (PF) 250 MCG/5ML IJ SOLN
INTRAMUSCULAR | Status: AC
  Filled 2021-11-21: qty 5

## 2021-11-21 MED ORDER — ONDANSETRON HCL 4 MG/2ML IJ SOLN
INTRAMUSCULAR | Status: DC | PRN
  Administered 2021-11-21: 4 mg via INTRAVENOUS

## 2021-11-21 MED ORDER — CHLORHEXIDINE GLUCONATE CLOTH 2 % EX PADS
6.0000 | MEDICATED_PAD | Freq: Every day | CUTANEOUS | Status: DC
  Administered 2021-11-22 – 2021-11-23 (×2): 6 via TOPICAL

## 2021-11-21 MED ORDER — PANTOPRAZOLE SODIUM 40 MG PO TBEC
40.0000 mg | DELAYED_RELEASE_TABLET | Freq: Every day | ORAL | Status: DC
  Administered 2021-11-22 – 2021-11-24 (×3): 40 mg via ORAL
  Filled 2021-11-21 (×3): qty 1

## 2021-11-21 MED ORDER — HEPARIN (PORCINE) 25000 UT/250ML-% IV SOLN
950.0000 [IU]/h | INTRAVENOUS | Status: AC
Start: 2021-11-21 — End: 2021-11-22
  Administered 2021-11-22: 950 [IU]/h via INTRAVENOUS
  Filled 2021-11-21: qty 250

## 2021-11-21 MED ORDER — DEXAMETHASONE SODIUM PHOSPHATE 10 MG/ML IJ SOLN
INTRAMUSCULAR | Status: DC | PRN
  Administered 2021-11-21: 5 mg via INTRAVENOUS

## 2021-11-21 MED ORDER — IOHEXOL 300 MG/ML  SOLN
100.0000 mL | Freq: Once | INTRAMUSCULAR | Status: AC | PRN
Start: 2021-11-21 — End: 2021-11-21
  Administered 2021-11-21: 20 mL via INTRA_ARTERIAL

## 2021-11-21 NOTE — ED Notes (Signed)
Pt arrived to room 15 from CT2

## 2021-11-21 NOTE — Progress Notes (Signed)
[place] for heparin Indication: pulmonary embolus  Heparin Dosing Weight: 53.2 kg  Labs: Recent Labs    11/21/21 0721 11/21/21 0750  HGB 15.3* 14.7  HCT 45.0 47.7*  PLT  --  139*  CREATININE 1.50*  --     Assessment: 66 yof with hx prior strokes, PAF not on anticoagulation due to hx falls, L2-3 decompression and fusion in 08/2021 for cauda equina syndrome presenting as code stroke with L facial droop, AMS. Code stroke cancelled with low suspicion per Neurology and team signed off (imaging showed small old stroke but no acute findings). CTA did show saddle PE. Pharmacy consulted to dose heparin. Hg wnl, plt 137. SCr 1.5 (appears to be baseline) on presentation. No bleed issues reported.  Goal of Therapy:  Heparin level 0.3-0.7 units/ml Monitor platelets by anticoagulation protocol: Yes   Plan:  Heparin 4000 unit bolus x 1 Start heparin at 950 units/hr Check 6hr heparin level Monitor daily CBC, s/sx bleeding F/u long-term anticoagulation plan   Arturo Morton, PharmD, BCPS Clinical Pharmacist 11/21/2021 8:19 AM

## 2021-11-21 NOTE — ED Notes (Signed)
Notified CT transport and CCM provider that pt is not stable to go to CT at this time d/t BP. Per MD, peripheral levo to be started.

## 2021-11-21 NOTE — Anesthesia Preprocedure Evaluation (Signed)
Anesthesia Evaluation  Patient identified by MRN, date of birth, ID band Patient awake    Reviewed: Allergy & Precautions, NPO status , Patient's Chart, lab work & pertinent test resultsPreop documentation limited or incomplete due to emergent nature of procedure.  Airway Mallampati: III  TM Distance: >3 FB Neck ROM: Full    Dental  (+) Dental Advisory Given   Pulmonary sleep apnea , COPD, former smoker,    breath sounds clear to auscultation       Cardiovascular hypertension, Pt. on medications + CAD and + Peripheral Vascular Disease  + dysrhythmias Atrial Fibrillation + Valvular Problems/Murmurs  Rhythm:Regular Rate:Normal  Saddle PE   Neuro/Psych  Neuromuscular disease    GI/Hepatic negative GI ROS, Neg liver ROS,   Endo/Other  diabetes, Type 2  Renal/GU CRFRenal disease     Musculoskeletal  (+) Arthritis ,   Abdominal   Peds  Hematology negative hematology ROS (+)   Anesthesia Other Findings   Reproductive/Obstetrics                             Lab Results  Component Value Date   WBC 8.6 11/21/2021   HGB 14.7 11/21/2021   HCT 47.7 (H) 11/21/2021   MCV 100.0 11/21/2021   PLT 139 (L) 11/21/2021   Lab Results  Component Value Date   CREATININE 1.49 (H) 11/21/2021   BUN 25 (H) 11/21/2021   NA 136 11/21/2021   K 4.3 11/21/2021   CL 106 11/21/2021   CO2 18 (L) 11/21/2021    Anesthesia Physical Anesthesia Plan  ASA: 4  Anesthesia Plan: MAC   Post-op Pain Management: Minimal or no pain anticipated   Induction:   PONV Risk Score and Plan: 2 and Dexamethasone and Ondansetron  Airway Management Planned: Natural Airway and Simple Face Mask  Additional Equipment: Arterial line  Intra-op Plan:   Post-operative Plan:   Informed Consent: I have reviewed the patients History and Physical, chart, labs and discussed the procedure including the risks, benefits and alternatives  for the proposed anesthesia with the patient or authorized representative who has indicated his/her understanding and acceptance.     Dental advisory given  Plan Discussed with: CRNA  Anesthesia Plan Comments:         Anesthesia Quick Evaluation

## 2021-11-21 NOTE — Consult Note (Signed)
NEUROLOGY CONSULTATION NOTE   Date of service: November 21, 2021 Patient Name: Lisa Maxwell MRN:  [id number] DOB:  January 26, 1946 Reason for consult: "Stroke code for concern for L sided weakness per husband but L facial droop and R hand weakness per EMS" Requesting Provider: Carmin Muskrat, MD _ _ _   _ __   _ __ _ _  __ __   _ __   __ _  History of Present Illness  Lisa Maxwell is a 76 y.o. female with PMH significant for prior strokes, hypertension, paroxysmal atrial fibrillation not on anticoagulation due to history of multiple falls, diabetes type 2, morbid obesity, hyperlipidemia, peripheral vascular disease, history of smoking, OSA, spinal cord stimulator, L2-3 decompression and fusion in May 2023 for cauda equina syndrome.  Patient sleeps on a couch on the first floor of her home due to mobility issues.  She has a commode at the bedside.  Has been helped her to the commode last night at 2300 on 11/20/2021 and then helped her to the couch.  Husband woke up this morning at 0615 on 11/21/2021 and when he went down stairs found her slumped over on the bedside commode.  He was having trouble getting her out of the commode and she was unresponsive so he called EMS.  Some questionable concerns about potential strokes were reported by the husband to the EMS and EMS felt that the patient had a left facial droop.  However, husband denies this to me.  Patient has facial folds but I did not see an obvious facial droop.  2 spends most of her day in a wheelchair or can walk short distances.  She needs help with groceries, preparing meals for her, showers.  She can dress and undress. CT head without contrast with no acute intracranial abnormality, aspects of 10.  An old small left occipital infarct.  CT angio head and neck with no LVO but does demonstrate a saddle PE.  LKW: 2300 on 11/20/2021 mRS: 4 tNKASE: Not offered as she is outside the window and low overall suspicion for stroke. Thrombectomy: Not  offered due to no LVO. NIHSS components Score: Comment  1a Level of Conscious 0'[]'$  1'[x]'$  2'[]'$  3'[]'$      1b LOC Questions 0'[]'$  1'[x]'$  2'[]'$       1c LOC Commands 0'[x]'$  1'[]'$  2'[]'$       2 Best Gaze 0'[x]'$  1'[]'$  2'[]'$       3 Visual 0'[x]'$  1'[]'$  2'[]'$  3'[]'$      4 Facial Palsy 0'[x]'$  1'[]'$  2'[]'$  3'[]'$      5a Motor Arm - left 0'[]'$  1'[x]'$  2'[]'$  3'[]'$  4'[]'$  UN'[]'$    5b Motor Arm - Right 0'[]'$  1'[x]'$  2'[]'$  3'[]'$  4'[]'$  UN'[]'$    6a Motor Leg - Left 0'[]'$  1'[]'$  2'[x]'$  3'[]'$  4'[]'$  UN'[]'$    6b Motor Leg - Right 0'[]'$  1'[]'$  2'[x]'$  3'[]'$  4'[]'$  UN'[]'$    7 Limb Ataxia 0'[x]'$  1'[]'$  2'[]'$  3'[]'$  UN'[]'$     8 Sensory 0'[x]'$  1'[]'$  2'[]'$  UN'[]'$      9 Best Language 0'[]'$  1'[x]'$  2'[]'$  3'[]'$      10 Dysarthria 0'[]'$  1'[x]'$  2'[]'$  UN'[]'$      11 Extinct. and Inattention 0'[x]'$  1'[]'$  2'[]'$       TOTAL: 10      ROS   Unable to obtain a detailed review of system due to encephalopathy.  Past History   Past Medical History:  Diagnosis Date   CAD (coronary artery disease) 11/04/2016   Cancer ([place])    skin cancer   Cerebrovascular disease    Chronic atrial  fibrillation ([place]) 09/28/2018   Chronic bilateral low back pain without sciatica 03/08/2018   Chronic kidney disease    Chronic obstructive lung disease ([place]) 09/28/2018   Chronic pain syndrome 03/08/2018   Chronic renal failure 09/28/2018   Chronic, continuous use of opioids 03/04/2021   Last Assessment & Plan:  Formatting of this note might be different from the original. Patient and I have discussed the hazardous effects of continued opiate pain medication usage. Risks and benefits of above medications including but not limited to possibility of respiratory depression, sedation, and even death were discussed with the patient who expressed an understanding.  Patient did not displ   CKD (chronic kidney disease) stage 3, GFR 30-59 ml/min (HCC)    Comprehensive diabetic foot examination, type 2 DM, encounter for ([place]) 04/29/2021   DDD (degenerative disc disease), lumbar 11/17/2018   Last Assessment & Plan:  Formatting of this note might be different from the original. See spinal stenosis plan    Diabetes ([place])    Diabetes mellitus due to underlying condition with unspecified complications ([place]) 10/12/2954   Dyshidrotic eczema 04/29/2021   Dysrhythmia    afib   Essential hypertension 11/04/2016   Facet arthritis of lumbar region 03/08/2018   Facet joint disease 11/17/2018   Last Assessment & Plan:  Formatting of this note might be different from the original. See spinal stenosis plan   Foraminal stenosis of lumbar region 03/08/2018   Heart murmur    History of knee replacement, total, bilateral 11/17/2018   Last Assessment & Plan:  Formatting of this note might be different from the original. Have recommended to her several times if any further concerns in regards to the pain status post arthroplasty, she is to follow up with her surgeon.   Hypertension    Hypertensive disorder 09/28/2018   Increased body mass index 09/28/2018   Lumbar radiculopathy 03/08/2018   Lumbar spondylosis 03/20/2019   Mixed dyslipidemia 11/04/2016   Morbid obesity ([place]) 11/04/2016   Muscle pain 04/29/2020   Osteoarthritis of left knee    PAF (paroxysmal atrial fibrillation) ([place]) 04/30/2015   Pain management contract agreement 04/13/2019   Last Assessment & Plan:  Formatting of this note might be different from the original. Contract updated today.  UDS completed.  [place] controlled substance registry reviewed and is consistent with her regimen.   Peripheral vascular disease ([place]) 09/28/2018   Preop cardiovascular exam 05/01/2019   Renal insufficiency 04/30/2015   S/P insertion of spinal cord stimulator 07/13/2019   Last Assessment & Plan:  Formatting of this note might be different from the original. About 5 months status post insertion of spinal cord stimulator with excellent relief.  She continues to keep close follow-up with her representative and has been working to appropriately find best programs to get her adequate pain relief.   Sleep apnea    Smoker 09/28/2018   Spinal stenosis of lumbar region 03/20/2019    Spinal stenosis of lumbar region with neurogenic claudication 03/08/2018   Last Assessment & Plan:  Formatting of this note is different from the original. 76 year old female with chronic low back pain and bilateral, right greater than left, L4-5 radicular pain, and claudication. She has substantial amount of multifactorial degenerative thoracic and lumbar spine pathology.  After being deemed an inappropriate open surgical candidate, and failing to respond to multiple in   Spondylolisthesis 03/20/2019   Spondylosis of lumbosacral region without myelopathy or radiculopathy 01/30/2019   Last Assessment & Plan:  Formatting of this  note might be different from the original. See spinal stenosis plan   Type 2 diabetes mellitus ([place]) 09/28/2018   Past Surgical History:  Procedure Laterality Date   ABDOMINAL HYSTERECTOMY     BACK SURGERY     EYE SURGERY     bilateral cataracts   JOINT REPLACEMENT Bilateral    knees   SPINAL CORD STIMULATOR INSERTION N/A 07/07/2019   Procedure: LUMBAR SPINAL CORD STIMULATOR INSERTION;  Surgeon: Clydell Hakim, MD;  Location: [facility];  Service: Neurosurgery;  Laterality: N/A;   total left knee     Family History  Problem Relation Age of Onset   Diabetes Mother    Social History   Socioeconomic History   Marital status: Married    Spouse name: Not on file   Number of children: Not on file   Years of education: Not on file   Highest education level: Not on file  Occupational History   Not on file  Tobacco Use   Smoking status: Former    Types: Cigarettes    Quit date: 05/08/2019    Years since quitting: 2.5   Smokeless tobacco: Never  Vaping Use   Vaping Use: Never used  Substance and Sexual Activity   Alcohol use: No   Drug use: No   Sexual activity: Not on file  Other Topics Concern   Not on file  Social History Narrative   Not on file   Social Determinants of Health   Financial Resource Strain: Not on file  Food Insecurity: Not on file   Transportation Needs: Not on file  Physical Activity: Not on file  Stress: Not on file  Social Connections: Not on file   No Known Allergies  Medications  (Not in a hospital admission)    Vitals   Vitals:   11/21/21 0700  Weight: 71.4 kg     Body mass index is 35.29 kg/m.  Physical Exam   General: Laying comfortably in bed; in no acute distress.  HENT: Normal oropharynx and mucosa. Normal external appearance of ears and nose.  Neck: Supple, no pain or tenderness  CV: No JVD. No peripheral edema.  Pulmonary: Symmetric Chest rise. Normal respiratory effort.  Abdomen: Soft to touch, non-tender.  Ext: No cyanosis, edema, or deformity  Skin: No rash. Normal palpation of skin.   Musculoskeletal: Normal digits and nails by inspection. No clubbing.   Neurologic Examination  Mental status/Cognition: Somnolent, opens eyes to voice requires a lot of encouragement to answer questions follow commands.  Oriented to self, not oriented to place, oriented to age but not to month and year.   Speech/language: Limited due to encephalopathy.  Nonfluent, mildly dysarthric, comprehension intact to simple commands, able to name some objects.   Cranial nerves:   CN II Pupils equal and reactive to light, difficult to evaluate for VF deficit is as she keeps her eyes closed but she does make eye contact on both the left and the right side.   CN III,IV,VI EOM intact, no gaze preference or deviation, no nystagmus   CN V normal sensation in V1, V2, and V3 segments bilaterally   CN VII no asymmetry, no nasolabial fold flattening   CN VIII Turns head towards speech   CN IX & X Unable to assess due to poor participation   CN XI Head midline.   CN XII midline tongue protrusion   Motor:  Muscle bulk: poor, tone normal. Limited muscle strength testing secondary to encephalopathy. She will keep both of  her arms up off the bed for more than 10 seconds with some drift bilaterally. She has back and leg  pain which significantly limits lower extremity exam.  However, when her legs are held up off the bed, they will gradually fall back to the bed and she does seem to be in pain when I do that.  Sensation:  Light touch Intact throughout   Pin prick    Temperature    Vibration   Proprioception    Coordination/Complex Motor:  - Finger to Nose intact bilaterally - Heel to shin unable to do - Rapid alternating movement are slowed - Gait: Deferred as she is wheelchair-bound at baseline. Labs   CBC:  Recent Labs  Lab 11/21/21 0721  HGB 15.3*  HCT 04.5    Basic Metabolic Panel:  Lab Results  Component Value Date   NA 137 11/21/2021   K 4.5 11/21/2021   CO2 28 08/26/2021   GLUCOSE 159 (H) 11/21/2021   BUN 30 (H) 11/21/2021   CREATININE 1.50 (H) 11/21/2021   CALCIUM 10.3 08/26/2021   GFRNONAA 32 (L) 08/26/2021   GFRAA 35 (L) 07/04/2019   Lipid Panel: No results found for: "LDLCALC" HgbA1c:  Lab Results  Component Value Date   HGBA1C 6.5 (H) 08/26/2021   Urine Drug Screen: No results found for: "LABOPIA", "COCAINSCRNUR", "LABBENZ", "AMPHETMU", "THCU", "LABBARB"  Alcohol Level No results found for: "ETH"  CT Head without contrast(Personally reviewed): CTH was negative for a large hypodensity concerning for a large territory infarct or hyperdensity concerning for an ICH  CT angio Head and Neck with contrast(Personally reviewed): No LVO but notable for a saddle PE.  Impression   Lisa Maxwell is a 76 y.o. female with PMH significant for prior strokes, hypertension, paroxysmal atrial fibrillation not on anticoagulation due to history of multiple falls, diabetes type 2, morbid obesity, hyperlipidemia, peripheral vascular disease, history of smoking, OSA, spinal cord stimulator, L2-3 decompression and fusion in May 2023 for cauda equina syndrome.  She was brought in as a code stroke for concern for left facial droop and encephalopathy/confusion with inability to squeeze with  her right hand.  On my evaluation, she is encephalopathic which significantly limits exam but with a lot of encouragement and coaxing, she is able to follow commands in all extremities and is nonfocal.  Overall, my suspicion for stroke is very low and I suspect that she probably had a presyncopal episode as she was found passed out/slumped over on the bedside commode.  Suspect the likely etiology is probably the saddle PE but I would consider other toxic metabolic/infectious etiologies too.  Recommendations  - Low suspicion for stroke, stroke code cancelled. - Suspect potential toxic/metabolic encephalopathy in the setting of saddle PE, potential infections. - No further inpatient neuro workup. Neurology inpatient team will signoff. ______________________________________________________________________  This patient is critically ill and at significant risk of neurological worsening, death and care requires constant monitoring of vital signs, hemodynamics,respiratory and cardiac monitoring, neurological assessment, discussion with family, other specialists and medical decision making of high complexity. I spent 40 minutes of neurocritical care time  in the care of  this patient. This was time spent independent of any time provided by nurse practitioner or PA.  Donnetta Simpers [facility] Pager Number [phone] 11/21/2021  8:08 AM   Thank you for the opportunity to take part in the care of this patient. If you have any further questions, please contact the neurology consultation attending.  Signed,  Donnetta Simpers [facility]  Pager Number [phone] _ _ _   _ __   _ __ _ _  __ __   _ __   __ _

## 2021-11-21 NOTE — Progress Notes (Addendum)
[place] for heparin Indication: pulmonary embolus  Heparin Dosing Weight: 53.2 kg  Labs: Recent Labs    11/21/21 0721 11/21/21 0750 11/21/21 0835 11/21/21 1502  HGB 15.3* 14.7  --   --   HCT 45.0 47.7*  --   --   PLT  --  139*  --   --   APTT  --   --  32 >200*  LABPROT  --   --  17.1* 16.3*  INR  --   --  1.4* 1.3*  HEPARINUNFRC  --   --   --  >1.10*  CREATININE 1.50* 1.49*  --   --     Assessment: 66 yof with hx prior strokes, PAF not on anticoagulation due to hx falls, L2-3 decompression and fusion in 08/2021 for cauda equina syndrome presenting as code stroke with L facial droop, AMS. Code stroke cancelled with low suspicion per Neurology and team signed off (imaging showed small old stroke but no acute findings). CTA did show saddle PE. Pharmacy consulted to dose heparin. Hg wnl, plt 137. SCr 1.5 (appears to be baseline) on presentation. No bleed issues reported.  Patient s/p thrombectomy and received bolus of Heparin in IR at 1305 PM. Level drawn 2 hrs later is supra-therapeutic but impacted by bolus effect. IV site with minor bleeding has been changed and no further bleeding. Noted low CrCl at 25.9   Goal of Therapy:  Heparin level 0.3-0.7 units/ml Monitor platelets by anticoagulation protocol: Yes   Plan:  *Level impacted by bolus effect -- Continue heparin at 950 units/hr Recheck at 2000 PM and check CBC Monitor daily CBC, s/sx bleeding F/u long-term anticoagulation plan  Addendum: Patient with new bleeding around IV site.  Plan: Stopping Heparin and checking level and CBC now.   Sloan Leiter, PharmD, BCPS, BCCCP Clinical Pharmacist Please refer to [facility] for [place] numbers 11/21/2021 4:40 PM

## 2021-11-21 NOTE — Anesthesia Procedure Notes (Signed)
Procedure Name: MAC Date/Time: 11/21/2021 11:25 AM  Performed by: Reece Agar, CRNAPre-anesthesia Checklist: Patient identified, Emergency Drugs available, Suction available, Patient being monitored and Timeout performed Patient Re-evaluated:Patient Re-evaluated prior to induction Oxygen Delivery Method: Simple face mask

## 2021-11-21 NOTE — Transfer of Care (Signed)
Immediate Anesthesia Transfer of Care Note  Patient: Lisa Maxwell  Procedure(s) Performed: IR WITH ANESTHESIA  Patient Location: ICU  Anesthesia Type:MAC  Level of Consciousness: awake and alert   Airway & Oxygen Therapy: Patient Spontanous Breathing and Patient connected to face mask oxygen  Post-op Assessment: Report given to RN and Post -op Vital signs reviewed and stable  Post vital signs: Reviewed and stable  Last Vitals:  Vitals Value Taken Time  BP 112/60 11/21/21 1446  Temp    Pulse 94 11/21/21 1446  Resp 17 11/21/21 1446  SpO2 97 % 11/21/21 1446  Vitals shown include unvalidated device data.  Last Pain:  Vitals:   11/21/21 0746  TempSrc: Axillary         Complications: No notable events documented.

## 2021-11-21 NOTE — Anesthesia Procedure Notes (Signed)
Arterial Line Insertion Start/End8/18/2023 11:29 AM, 11/21/2021 11:34 AM Performed by: Suzette Battiest, MD, anesthesiologist  Patient location: Pre-op. Preanesthetic checklist: patient identified, IV checked, site marked, risks and benefits discussed, surgical consent, monitors and equipment checked, pre-op evaluation, timeout performed and anesthesia consent Lidocaine 1% used for infiltration Left, radial was placed Catheter size: 20 G Hand hygiene performed  and maximum sterile barriers used   Attempts: 1 Procedure performed without using ultrasound guided technique. Following insertion, dressing applied and Biopatch. Post procedure assessment: normal and unchanged  Patient tolerated the procedure well with no immediate complications.

## 2021-11-21 NOTE — TOC Progression Note (Signed)
Transition of Care [facility]) - Progression Note    Patient Details  Name: NAKEITHA MILLIGAN MRN: [id number] Date of Birth: 1945-09-19  Transition of Care [facility]) CM/SW [place], RN Phone Number:[phone]  11/21/2021, 2:16 PM  Clinical Narrative:     Transition of Care [facility]) Screening Note   Patient Details  Name: KENLYNN HOUDE Date of Birth: Oct 24, 1945   Transition of Care [facility]) CM/SW Contact:    Angelita Ingles, RN Phone Number: 11/21/2021, 2:16 PM    Transition of Care Department [facility]) has reviewed patient and no TOC needs have been identified at this time. We will continue to monitor patient advancement through interdisciplinary progression rounds.           Expected Discharge Plan and Services                                                 Social Determinants of Health (SDOH) Interventions    Readmission Risk Interventions     No data to display

## 2021-11-21 NOTE — Code Documentation (Signed)
Stroke Response Nurse Documentation Code Documentation  Lisa Maxwell is a 76 y.o. female arriving to [facility]  via [facility] on 11/21/21 with past medical hx of HTN, HLD, Afib, DM, OSA. On No antithrombotic. Code stroke was activated by EMS.   Patient from home where she was [place] 11/20/21 at 2300. Her husband reports that she went to bed around 2300. When he woke up this morning at 0615 she was slumped over on the bedside commode but was unsure how long she had been there.   Stroke team at the bedside on patient arrival. Labs drawn and patient cleared for CT by Dr. Vanita Panda. Patient to CT with team. NIHSS 10, see documentation for details and code stroke times. Patient with decreased LOC, disoriented, bilateral arm weakness, and bilateral leg weakness on exam. The following imaging was completed:  CT Head and CTA. Patient is not a candidate for IV Thrombolytic due to OOW. Patient is not a candidate for IR due to no suspected LVO per MD.   Care Plan: cancelled code stroke and admit for further work up.   Bedside handoff with ED RN Raquel Sarna.    Meda Klinefelter  Stroke Response RN

## 2021-11-21 NOTE — ED Triage Notes (Signed)
Pt arrived to ED via [facility] as a CODE STROKE. LKN was 2300, pt woke up w/ the following reported s/s: L facial droop, leaning R, when asked to squeeze EMT's hands they reported she would only squeeze her L. Pt AMS and oriented to herself only right now. Pt O2 91% w/ EMS on RA. EMS placed pt on 2L and sats came u8p to 94-96%. CBG 148. HR 75, BP 120/80. Bilateral 18g AC's

## 2021-11-21 NOTE — Consult Note (Signed)
Chief Complaint: Pulmonary embolism  Referring Physician(s): Freda Jackson  Supervising Physician: Michaelle Birks  Patient Status: [facility] - ED  History of Present Illness: Lisa Maxwell is a 76 y.o. female who was found unresponsive by her husband this morning.  She presented to the ED as a code stroke due to her history of atrial fibrillation and unable to use anticoagulation secondary to falls.    CT head did not reveal neurological involvement but did reveal bilateral saddle PE pulmonary.  We are asked to evaluate for PE thrombectomy.  She is NPO. Family and best friend at bedside.  She developed hypotension and now is on Levophed 6 mcg/min.  She has been started on heparin drip.  She does have some underlying CKD. It appears her baseline creatinine is 1.67. Today it is 1.5.  She received IV contrast for CTA head/neck and CTA chest to eval PE. She will also receive contrast for procedure. This puts her at risk for contrast nephropathy.   Past Medical History:  Diagnosis Date   CAD (coronary artery disease) 11/04/2016   Cancer ([place])    skin cancer   Cerebrovascular disease    Chronic atrial fibrillation ([place]) 09/28/2018   Chronic bilateral low back pain without sciatica 03/08/2018   Chronic kidney disease    Chronic obstructive lung disease ([place]) 09/28/2018   Chronic pain syndrome 03/08/2018   Chronic renal failure 09/28/2018   Chronic, continuous use of opioids 03/04/2021   Last Assessment & Plan:  Formatting of this note might be different from the original. Patient and I have discussed the hazardous effects of continued opiate pain medication usage. Risks and benefits of above medications including but not limited to possibility of respiratory depression, sedation, and even death were discussed with the patient who expressed an understanding.  Patient did not displ   CKD (chronic kidney disease) stage 3, GFR 30-59 ml/min (HCC)    Comprehensive diabetic foot  examination, type 2 DM, encounter for ([place]) 04/29/2021   DDD (degenerative disc disease), lumbar 11/17/2018   Last Assessment & Plan:  Formatting of this note might be different from the original. See spinal stenosis plan   Diabetes ([place])    Diabetes mellitus due to underlying condition with unspecified complications ([place]) 12/10/6211   Dyshidrotic eczema 04/29/2021   Dysrhythmia    afib   Essential hypertension 11/04/2016   Facet arthritis of lumbar region 03/08/2018   Facet joint disease 11/17/2018   Last Assessment & Plan:  Formatting of this note might be different from the original. See spinal stenosis plan   Foraminal stenosis of lumbar region 03/08/2018   Heart murmur    History of knee replacement, total, bilateral 11/17/2018   Last Assessment & Plan:  Formatting of this note might be different from the original. Have recommended to her several times if any further concerns in regards to the pain status post arthroplasty, she is to follow up with her surgeon.   Hypertension    Hypertensive disorder 09/28/2018   Increased body mass index 09/28/2018   Lumbar radiculopathy 03/08/2018   Lumbar spondylosis 03/20/2019   Mixed dyslipidemia 11/04/2016   Morbid obesity ([place]) 11/04/2016   Muscle pain 04/29/2020   Osteoarthritis of left knee    PAF (paroxysmal atrial fibrillation) ([place]) 04/30/2015   Pain management contract agreement 04/13/2019   Last Assessment & Plan:  Formatting of this note might be different from the original. Contract updated today.  UDS completed.  [place] controlled substance registry  reviewed and is consistent with her regimen.   Peripheral vascular disease ([place]) 09/28/2018   Preop cardiovascular exam 05/01/2019   Renal insufficiency 04/30/2015   S/P insertion of spinal cord stimulator 07/13/2019   Last Assessment & Plan:  Formatting of this note might be different from the original. About 5 months status post insertion of spinal cord stimulator with excellent relief.  She continues  to keep close follow-up with her representative and has been working to appropriately find best programs to get her adequate pain relief.   Sleep apnea    Smoker 09/28/2018   Spinal stenosis of lumbar region 03/20/2019   Spinal stenosis of lumbar region with neurogenic claudication 03/08/2018   Last Assessment & Plan:  Formatting of this note is different from the original. 76 year old female with chronic low back pain and bilateral, right greater than left, L4-5 radicular pain, and claudication. She has substantial amount of multifactorial degenerative thoracic and lumbar spine pathology.  After being deemed an inappropriate open surgical candidate, and failing to respond to multiple in   Spondylolisthesis 03/20/2019   Spondylosis of lumbosacral region without myelopathy or radiculopathy 01/30/2019   Last Assessment & Plan:  Formatting of this note might be different from the original. See spinal stenosis plan   Type 2 diabetes mellitus ([place]) 09/28/2018    Past Surgical History:  Procedure Laterality Date   ABDOMINAL HYSTERECTOMY     BACK SURGERY     EYE SURGERY     bilateral cataracts   JOINT REPLACEMENT Bilateral    knees   SPINAL CORD STIMULATOR INSERTION N/A 07/07/2019   Procedure: LUMBAR SPINAL CORD STIMULATOR INSERTION;  Surgeon: Clydell Hakim, MD;  Location: [place];  Service: Neurosurgery;  Laterality: N/A;   total left knee      Allergies: Patient has no known allergies.  Medications: Prior to Admission medications   Medication Sig Start Date End Date Taking? Authorizing Provider  aspirin 81 MG tablet Take 1 tablet (81 mg total) by mouth daily. 09/02/21   Consuella Lose, MD  bethanechol (URECHOLINE) 25 MG tablet Take 25 mg by mouth every morning. 10/31/21   [provider]  bethanechol (URECHOLINE) 50 MG tablet Take 50-100 mg by mouth See admin instructions. Take 50 mg by mouth in the morning and 25 mg at night 08/12/21   [provider]  carvedilol (COREG)  12.5 MG tablet Take 12.5 mg by mouth 2 (two) times daily. 04/25/19   [provider]  docusate sodium (COLACE) 100 MG capsule Take 100 mg by mouth 2 (two) times daily as needed. 10/08/21   [provider]  fluticasone furoate-vilanterol (BREO ELLIPTA) 100-25 MCG/ACT AEPB Inhale 1 puff into the lungs daily.    [provider]  furosemide (LASIX) 40 MG tablet Take 40 mg by mouth daily.  02/19/18   [provider]  gabapentin (NEURONTIN) 600 MG tablet Take 600 mg by mouth at bedtime. 04/06/21   [provider]  HYDROcodone-acetaminophen (NORCO) 10-325 MG tablet Take 0.5 tablets by mouth every 6 (six) hours as needed for severe pain.    [provider]  latanoprost (XALATAN) 0.005 % ophthalmic solution SMARTSIG:In Eye(s) 11/17/21   [provider]  loratadine (CLARITIN) 10 MG tablet Take 10 mg by mouth daily.    [provider]  losartan (COZAAR) 50 MG tablet Take 50 mg by mouth daily.    [provider]  lovastatin (MEVACOR) 40 MG tablet Take 40 mg by mouth at bedtime.  [provider]  methocarbamol (ROBAXIN) 500 MG tablet Take 1 tablet (500 mg total) by mouth every 6 (six) hours as needed for muscle spasms. 08/29/21   Consuella Lose, MD  Multiple Vitamin (MULTIVITAMIN WITH MINERALS) TABS tablet Take 1 tablet by mouth daily.    [provider]  nitrofurantoin, macrocrystal-monohydrate, (MACROBID) 100 MG capsule Take 100 mg by mouth daily. 07/30/21   [provider]  nitroGLYCERIN (NITROSTAT) 0.4 MG SL tablet Place 0.4 mg under the tongue every 5 (five) minutes as needed for chest pain.    [provider]  PROAIR HFA 108 [phone] Base) MCG/ACT inhaler Inhale 1-2 puffs into the lungs every 4 (four) hours as needed for wheezing or shortness of breath.  10/12/16   [provider]  RYBELSUS 3 MG TABS Take 3 mg by mouth daily. 05/12/21   [provider]  spironolactone (ALDACTONE) 50  MG tablet Take 50 mg by mouth daily. 01/04/21   [provider]  tamsulosin (FLOMAX) 0.4 MG CAPS capsule Take 0.4 mg by mouth daily after supper. 06/10/19   [provider]  trimethoprim (TRIMPEX) 100 MG tablet Take 100 mg by mouth daily. 10/25/21   [provider]  vitamin B-12 (CYANOCOBALAMIN) 500 MCG tablet Take 500 mcg by mouth daily.     [provider]     Family History  Problem Relation Age of Onset   Diabetes Mother     Social History   Socioeconomic History   Marital status: Married    Spouse name: Not on file   Number of children: Not on file   Years of education: Not on file   Highest education level: Not on file  Occupational History   Not on file  Tobacco Use   Smoking status: Former    Types: Cigarettes    Quit date: 05/08/2019    Years since quitting: 2.5   Smokeless tobacco: Never  Vaping Use   Vaping Use: Never used  Substance and Sexual Activity   Alcohol use: No   Drug use: No   Sexual activity: Not on file  Other Topics Concern   Not on file  Social History Narrative   Not on file   Social Determinants of Health   Financial Resource Strain: Not on file  Food Insecurity: Not on file  Transportation Needs: Not on file  Physical Activity: Not on file  Stress: Not on file  Social Connections: Not on file     Review of Systems  Unable to perform ROS: Acuity of condition    Vital Signs: BP (!) 87/30   Pulse 83   Temp (!) 96.3 F (35.7 C) (Axillary)   Resp (!) 22   Ht '4\' 8"'$  (1.422 m)   Wt 157 lb 6.5 oz (71.4 kg)   SpO2 100%   BMI 35.29 kg/m   Physical Exam Vitals reviewed.  Constitutional:      Appearance: She is ill-appearing.     Comments: Opens eyes to voice  HENT:     Head: Normocephalic and atraumatic.  Cardiovascular:     Rate and Rhythm: Normal rate.  Pulmonary:     Effort: Pulmonary effort is normal.     Breath sounds: Normal breath sounds.  Abdominal:     General: There is no distension.      Palpations: Abdomen is soft.     Tenderness: There is no abdominal tenderness.  Musculoskeletal:        General: Swelling present.  Skin:  General: Skin is warm and dry.     Imaging: DG Chest Port 1 View  Result Date: 11/21/2021 CLINICAL DATA:  76 year old female with saddle embolus identified on CTA for code stroke this morning. EXAM: PORTABLE CHEST 1 VIEW COMPARISON:  CTA head and neck 0731 hours. Prior chest radiographs 01/26/2018. FINDINGS: Portable AP semi upright view at 0836 hours. The patient is rotated to the right. Mediastinal contours not significantly changed since 2019. Allowing for portable technique the lungs are clear. No pneumothorax or pleural effusion. Visualized tracheal air column is within normal limits. Partially visible lumbar fusion hardware. Thoracic spinal stimulator device is new since 2019. No acute osseous abnormality identified. Paucity of bowel gas. IMPRESSION: Lungs remain clear despite Saddle Embolus demonstrated on CTA Neck this morning. Electronically Signed   By: Genevie Ann M.D.   On: 11/21/2021 08:52   CT ANGIO HEAD NECK W WO CM (CODE STROKE)  Result Date: 11/21/2021 CLINICAL DATA:  Code stroke.  76 year old female EXAM: CT ANGIOGRAPHY HEAD AND NECK TECHNIQUE: Multidetector CT imaging of the head and neck was performed using the standard protocol during bolus administration of intravenous contrast. Multiplanar CT image reconstructions and MIPs were obtained to evaluate the vascular anatomy. Carotid stenosis measurements (when applicable) are obtained utilizing NASCET criteria, using the distal internal carotid diameter as the denominator. RADIATION DOSE REDUCTION: This exam was performed according to the departmental dose-optimization program which includes automated exposure control, adjustment of the mA and/or kV according to patient size and/or use of iterative reconstruction technique. CONTRAST:  56m OMNIPAQUE IOHEXOL 350 MG/ML SOLN COMPARISON:  Plain  head CT 0722 hours today[facility] FINDINGS: CTA NECK Skeleton: Absent dentition. Widespread cervical spine degeneration. Upper thoracic disc and endplate degeneration. No acute osseous abnormality identified. Upper chest: Acute pulmonary embolus with SADDLE EMBOLUS VISIBLE. Bilateral lobar clot. Some underlying emphysema, but visible lung parenchyma is clear. No pleural effusion. Partially visible material in the left mainstem bronchus. No superior mediastinal lymphadenopathy. Other neck: Chronic polypoid opacity left nasopharynx series 19, image 63 not significantly changed from last year. No superimposed cervical lymphadenopathy identified. No other neck mass. Aortic arch: 3 vessel arch configuration. Mildly tortuous aortic arch. Mild to moderate plaque in the distal arch and proximal descending thoracic aorta. Right carotid system: Brachiocephalic soft and calcified plaque without stenosis. Negative right CCA. Calcified plaque at the medial left ICA origin and bulb but less than 50% stenosis with respect to the distal vessel. Tortuous right ICA at C1. Left carotid system: Negative left CCA. Mild left ICA calcified plaque without stenosis. Vertebral arteries: Proximal right subclavian soft and calcified plaque without stenosis. Normal right vertebral artery origin. Right vertebral artery is patent without stenosis to the skull base. Soft and calcified proximal left subclavian artery plaque without stenosis. Bulky similar plaque at the left vertebral origin resulting in severe stenosis (series 12, image 125). Despite string-sign appearing stenosis the left vertebral artery remains patent, and is fairly codominant. Left vertebral remains patent to the skull base with no additional stenosis. CTA HEAD Posterior circulation: Bilateral V4 segment calcified plaque with mild stenosis. Right V4 segment appears somewhat dominant. Normal left PICA origin and dominant appearing right  AICA. Patent vertebrobasilar junction and basilar artery without stenosis. SCA and PCA origins are normal. Posterior communicating arteries are diminutive or absent. Bilateral PCA branches are within normal limits. Anterior circulation: Both ICA siphons are patent with calcified plaque. Mild supraclinoid siphon stenosis on the right side. Patent carotid termini, MCA  and ACA origins. Normal anterior communicating artery and bilateral ACA branches. Left MCA M1 segment and bifurcation are patent without stenosis. Right MCA M1 segment and bifurcation are patent without stenosis. Bilateral MCA branches are within normal limits. Venous sinuses: Patent. Anatomic variants: Mildly dominant right vertebral artery. Review of the MIP images confirms the above findings Critical Value/emergent results were called by telephone at the time of interpretation on 11/21/2021 at 7:48 am to Dr. Donnetta Simpers and ED Dr. Vanita Panda, who verbally acknowledged these results. IMPRESSION: 1. Positive for Acute PE with Saddle Embolus and bilateral lobar thrombus visible in the upper chest. Critical Value/emergent results were called by telephone at the time of interpretation on 11/21/2021 at 7:48 am to Dr. Donnetta Simpers and ED Dr. Vanita Panda, who verbally acknowledged these results. 2. Negative for ELVO. Very Severe atherosclerotic stenosis at the Left Vertebral Artery origin, but the vessel remains patent. Generally mild for age atherosclerosis elsewhere in the head and neck. Aortic Atherosclerosis (ICD10-I70.0). 3. Chronic Left nasopharynx polyp, not significantly changed from last year. Recommend follow-up with ENT for direct visualization. Electronically Signed   By: Genevie Ann M.D.   On: 11/21/2021 07:57   CT HEAD CODE STROKE WO CONTRAST  Addendum Date: 11/21/2021   ADDENDUM REPORT: 11/21/2021 07:44 ADDENDUM: Study discussed by telephone with Dr. Donnetta Simpers on 11/21/2021 at 0739 hours. Electronically Signed   By: Genevie Ann M.D.    On: 11/21/2021 07:44   Result Date: 11/21/2021 CLINICAL DATA:  Code stroke.  76 year old female EXAM: CT HEAD WITHOUT CONTRAST TECHNIQUE: Contiguous axial images were obtained from the base of the skull through the vertex without intravenous contrast. RADIATION DOSE REDUCTION: This exam was performed according to the departmental dose-optimization program which includes automated exposure control, adjustment of the mA and/or kV according to patient size and/or use of iterative reconstruction technique. COMPARISON:  Head CT 12/20/2020. FINDINGS: Brain: Stable cerebral volume. No midline shift, ventriculomegaly, mass effect, evidence of mass lesion, intracranial hemorrhage or evidence of cortically based acute infarction. Small area of chronic encephalomalacia in the left occipital pole is stable from last year. Elsewhere patchy bilateral white matter hypodensity appears fairly stable and symmetric. Deep gray nuclei, brainstem and cerebellum appear negative. Vascular: Calcified atherosclerosis at the skull base. No suspicious intracranial vascular hyperdensity. Skull: Clivus appears to remain intact. No acute osseous abnormality identified. Sinuses/Orbits: Visualized paranasal sinuses and mastoids are stable and well aerated. Other: No acute orbit or scalp soft tissue finding. Lobulated chronic polypoid lesion in the posterior left nasal cavity appears stable from last year on series 4, image 6. ASPECTS [facility] Stroke Program Early CT Score) Total score (0-10 with 10 being normal): Ten IMPRESSION: 1. No acute cortically based infarct or acute intracranial hemorrhage identified. ASPECTS 10. 2. Small chronic Left PCA territory infarct. Mild for age bilateral white matter changes. 3. Chronic left nasal cavity or nasopharynx polypoid soft tissue lesion, not significantly changed from last year favoring benign etiology. Recommend follow-up with ENT for direct visualization. Electronically Signed: By: Genevie Ann M.D. On:  11/21/2021 07:31    Labs:  CBC: Recent Labs    08/26/21 1600 11/21/21 0721 11/21/21 0750  WBC 6.9  --  8.6  HGB 14.1 15.3* 14.7  HCT 43.1 45.0 47.7*  PLT 208  --  139*    COAGS: Recent Labs    11/21/21 0835  INR 1.4*  APTT 32    BMP: Recent Labs    08/26/21 1600 11/21/21 0721 11/21/21 0750  NA 139  137 136  K 4.5 4.5 4.3  CL 105 106 106  CO2 28  --  18*  GLUCOSE 105* 159* 157*  BUN 22 30* 25*  CALCIUM 10.3  --  10.0  CREATININE 1.67* 1.50* 1.49*  GFRNONAA 32*  --  36*    LIVER FUNCTION TESTS: Recent Labs    11/21/21 0750  BILITOT 0.7  AST 25  ALT 16  ALKPHOS 54  PROT 5.6*  ALBUMIN 3.3*    TUMOR MARKERS: No results for input(s): "AFPTM", "CEA", "CA199", "CHROMGRNA" in the last 8760 hours.  Assessment and Plan:  Symptomatic pulmonary embolism.  Will proceed with pulmonary angiography and mechanical thrombectomy urgently by Dr. Maryelizabeth Kaufmann.  Risks and benefits of PE thrombectomy were discussed with the patient's family including, but not limited to bleeding, possible life threatening bleeding and need for blood product transfusion, vascular injury, stroke, contrast induced renal failure, and even death.  I discussed at length the possibility of renal failure given the amount of contrast this patient will have today. Her family understands and feels they would rather save her life and have her on dialysis than the alternative which is possibly death.  This interventional procedure involves the use of X-rays and because of the nature of the planned procedure, it is possible that we will have prolonged use of X-ray fluoroscopy.  Potential radiation risks to you include (but are not limited to) the following: - A slightly elevated risk for cancer  several years later in life. This risk is typically less than 0.5% percent. This risk is low in comparison to the normal incidence of human cancer, which is 33% for women and 50% for men according to the [place]. - Radiation induced injury can include skin redness, resembling a rash, tissue breakdown / ulcers and hair loss (which can be temporary or permanent).   The likelihood of either of these occurring depends on the difficulty of the procedure and whether you are sensitive to radiation due to previous procedures, disease, or genetic conditions.   IF your procedure requires a prolonged use of radiation, you will be notified and given written instructions for further action.  It is your responsibility to monitor the irradiated area for the 2 weeks following the procedure and to notify your physician if you are concerned that you have suffered a radiation induced injury.    All of the patient's questions were answered, patient is agreeable to proceed.  Consent signed and in chart.   All of the patient's questions were answered, patient is agreeable to proceed. Consent signed and in chart.   All of the patient's questions were answered, patient is agreeable to proceed. Consent signed and in chart.   Thank you for allowing our service to participate in Lisa Maxwell 's care.  Electronically Signed: Murrell Redden, PA-C   11/21/2021, 10:14 AM      I spent a total of 40 Minutes  in face to face in clinical consultation, greater than 50% of which was counseling/coordinating care for PE thrombecomty.

## 2021-11-21 NOTE — ED Notes (Signed)
CCM at bedside 

## 2021-11-21 NOTE — Procedures (Signed)
Vascular and Interventional Radiology Procedure Note  Patient: Lisa Maxwell DOB: 06/03/1945 Medical Record Number: [id number] Note Date/Time: 11/21/21 2:31 PM   Performing Physician: Michaelle Birks, MD Assistant(s): Ruthann Cancer, MD  Diagnosis: Massive PE. Pulmonary HTN w R heart dysfunction.  Procedure:   PULMONARY ARTERIOGRAPHY MECHANICAL THROMBECTOMY, Catheter-directed   Anesthesia: General Anesthesia Complications: None Estimated Blood Loss:  150 mL Specimens: None  Findings:  - access via the RIGHT femoral vein. - Saddle PE, with additional burden at L pulmonary arterial bifurcation. - Successful mechanical thrombectomy with 16 F Penumbra Lightning catheter. - Main PA pressures pre/post, 40/33. - Good angiographic result and improved Pt parameters (HR, O2 requirement and SpO2 at the end of the case  Plan: - Post sheath removal precautions. Bedrest with RLE straight x2hrs.  Final report to follow once all images are reviewed and compared with previous studies.  See detailed dictation with images in PACS. The patient tolerated the procedure well without incident or complication and was returned to ICU in stable condition.    Michaelle Birks, MD [facility] Radiology   Pager. [place]

## 2021-11-21 NOTE — H&P (Signed)
NAME:  Lisa Maxwell, MRN:  [id number], DOB:  [birth year], LOS: 0 ADMISSION DATE:  11/21/2021, CONSULTATION DATE: 11/21/2021 REFERRING MD: Emergency department physician, CHIEF COMPLAINT: New diagnosis of PE  History of Present Illness:  76 year old female with a plethora of health issues that are well documented below.  She is originally admitted for code stroke due to her history of atrial fibrillation and unable to use anticoagulation secondary to falls.  CT did not reveal neurological involvement and she returned to her normal neurological baseline.  ET however did reveal bilateral saddle PE pulmonary critical care asked to evaluate for the possibility of heparin versus lytics.  Her multiple medical comorbidities precluded lytics therefore interventional radiology will be contacted for possible thrombectomy.  Of note she developed hypotension most likely multifactorial from poor p.o. intake questionable component of PE and she was treated with IV fluids initiation of initial peripheral vasopressors and she will be admitted to the intensive care unit.  Pertinent  Medical History   Past Medical History:  Diagnosis Date   CAD (coronary artery disease) 11/04/2016   Cancer ([place])    skin cancer   Cerebrovascular disease    Chronic atrial fibrillation ([place]) 09/28/2018   Chronic bilateral low back pain without sciatica 03/08/2018   Chronic kidney disease    Chronic obstructive lung disease ([place]) 09/28/2018   Chronic pain syndrome 03/08/2018   Chronic renal failure 09/28/2018   Chronic, continuous use of opioids 03/04/2021   Last Assessment & Plan:  Formatting of this note might be different from the original. Patient and I have discussed the hazardous effects of continued opiate pain medication usage. Risks and benefits of above medications including but not limited to possibility of respiratory depression, sedation, and even death were discussed with the patient who expressed an understanding.  Patient  did not displ   CKD (chronic kidney disease) stage 3, GFR 30-59 ml/min (HCC)    Comprehensive diabetic foot examination, type 2 DM, encounter for ([place]) 04/29/2021   DDD (degenerative disc disease), lumbar 11/17/2018   Last Assessment & Plan:  Formatting of this note might be different from the original. See spinal stenosis plan   Diabetes ([place])    Diabetes mellitus due to underlying condition with unspecified complications ([place]) 5/0/2774   Dyshidrotic eczema 04/29/2021   Dysrhythmia    afib   Essential hypertension 11/04/2016   Facet arthritis of lumbar region 03/08/2018   Facet joint disease 11/17/2018   Last Assessment & Plan:  Formatting of this note might be different from the original. See spinal stenosis plan   Foraminal stenosis of lumbar region 03/08/2018   Heart murmur    History of knee replacement, total, bilateral 11/17/2018   Last Assessment & Plan:  Formatting of this note might be different from the original. Have recommended to her several times if any further concerns in regards to the pain status post arthroplasty, she is to follow up with her surgeon.   Hypertension    Hypertensive disorder 09/28/2018   Increased body mass index 09/28/2018   Lumbar radiculopathy 03/08/2018   Lumbar spondylosis 03/20/2019   Mixed dyslipidemia 11/04/2016   Morbid obesity ([place]) 11/04/2016   Muscle pain 04/29/2020   Osteoarthritis of left knee    PAF (paroxysmal atrial fibrillation) ([place]) 04/30/2015   Pain management contract agreement 04/13/2019   Last Assessment & Plan:  Formatting of this note might be different from the original. Contract updated today.  UDS completed.  [place] controlled substance registry reviewed and  is consistent with her regimen.   Peripheral vascular disease ([place]) 09/28/2018   Preop cardiovascular exam 05/01/2019   Renal insufficiency 04/30/2015   S/P insertion of spinal cord stimulator 07/13/2019   Last Assessment & Plan:  Formatting of this note might be different from the  original. About 5 months status post insertion of spinal cord stimulator with excellent relief.  She continues to keep close follow-up with her representative and has been working to appropriately find best programs to get her adequate pain relief.   Sleep apnea    Smoker 09/28/2018   Spinal stenosis of lumbar region 03/20/2019   Spinal stenosis of lumbar region with neurogenic claudication 03/08/2018   Last Assessment & Plan:  Formatting of this note is different from the original. 76 year old female with chronic low back pain and bilateral, right greater than left, L4-5 radicular pain, and claudication. She has substantial amount of multifactorial degenerative thoracic and lumbar spine pathology.  After being deemed an inappropriate open surgical candidate, and failing to respond to multiple in   Spondylolisthesis 03/20/2019   Spondylosis of lumbosacral region without myelopathy or radiculopathy 01/30/2019   Last Assessment & Plan:  Formatting of this note might be different from the original. See spinal stenosis plan   Type 2 diabetes mellitus ([place]) 09/28/2018     Significant Hospital Events: Including procedures, antibiotic start and stop dates in addition to other pertinent events   Positive for saddle PE 11/21/2021 after being admitted for code stroke  Interim History / Subjective:  Called bedside for PE found to be hypertensive therefore fluid resuscitation peripheral IV intensive care unit questionable interventional radiology to perform thrombectomy.  Objective   Blood pressure (!) 89/66, pulse (!) 106, temperature (!) 96.3 F (35.7 C), temperature source Axillary, resp. rate (!) 25, height '4\' 8"'$  (1.422 m), weight 71.4 kg, SpO2 92 %.        Intake/Output Summary (Last 24 hours) at 11/21/2021 0926 Last data filed at 11/21/2021 0809 Gross per 24 hour  Intake --  Output 0 ml  Net 0 ml   Filed Weights   11/21/21 0700 11/21/21 0750  Weight: 71.4 kg 71.4 kg     Examination: General: Elderly female female he is poorly responsive but does follow commands severely kyphoscoliotic HENT: No JVD is appreciated Lungs: Diminished in the bases Cardiovascular: Heart sounds are regular Abdomen: Soft obese positive bowel sounds Extremities: Warm 2+ edema Neuro: Somewhat lethargic but follows commands GU: Voids  Resolved Hospital Problem list     Assessment & Plan:  Altered mental status originally thought to be a code stroke due to left facial droop with negative head CT.  She is not on anticoagulation for atrial fibrillation due to history of multiple falls. Mental status improved able to follow commands  Incidental finding of saddle PE with right heart strain.  Her age and extensive medical history preclude lytics explore the possibility of a thrombectomy with interventional radiology Contacted interventional radiology for questionable thrombectomy Admitted to the care unit Systemic heparin for now  Shock in the setting of PE with questionable hypovolemia secondary to dehydration due to low p.o. intake is being referred 72 hours Fluid resuscitation protocol total of 3 L Peripheral Levophed if needed Admitted to the intensive care unit Cardiac monitoring No antibiotics at this time Check procalcitonin for completeness [place]  History of coronary artery disease with chronic atrial fibrillation Monitored in the intensive care unit  Type 2 diabetes mellitus CBG (last 3)  Recent Labs  11/21/21 0715  GLUCAP 157*   Sliding-scale insulin protocol   Chronic renal insufficiency Lab Results  Component Value Date   CREATININE 1.49 (H) 11/21/2021   CREATININE 1.50 (H) 11/21/2021   CREATININE 1.67 (H) 08/26/2021   Avoid nephrotoxins   Best Practice (right click and "Reselect all SmartList Selections" daily)   Diet/type: NPO DVT prophylaxis: systemic heparin GI prophylaxis: PPI Lines: N/A Foley:  N/A Code Status:  full  code Last date of multidisciplinary goals of care discussion [tbd] 11/21/2021 husband and daughter at bedside confirm full CODE STATUS for now. Labs   CBC: Recent Labs  Lab 11/21/21 0721 11/21/21 0750  WBC  --  8.6  NEUTROABS  --  6.1  HGB 15.3* 14.7  HCT 45.0 47.7*  MCV  --  100.0  PLT  --  139*    Basic Metabolic Panel: Recent Labs  Lab 11/21/21 0721 11/21/21 0750  NA 137 136  K 4.5 4.3  CL 106 106  CO2  --  18*  GLUCOSE 159* 157*  BUN 30* 25*  CREATININE 1.50* 1.49*  CALCIUM  --  10.0   GFR: Estimated Creatinine Clearance: 25.9 mL/min (A) (by C-G formula based on SCr of 1.49 mg/dL (H)). Recent Labs  Lab 11/21/21 0750  WBC 8.6  LATICACIDVEN 1.9    Liver Function Tests: Recent Labs  Lab 11/21/21 0750  AST 25  ALT 16  ALKPHOS 54  BILITOT 0.7  PROT 5.6*  ALBUMIN 3.3*   No results for input(s): "LIPASE", "AMYLASE" in the last 168 hours. No results for input(s): "AMMONIA" in the last 168 hours.  ABG    Component Value Date/Time   TCO2 21 (L) 11/21/2021 0721     Coagulation Profile: Recent Labs  Lab 11/21/21 0835  INR 1.4*    Cardiac Enzymes: No results for input(s): "CKTOTAL", "CKMB", "CKMBINDEX", "TROPONINI" in the last 168 hours.  HbA1C: Hgb A1c MFr Bld  Date/Time Value Ref Range Status  08/26/2021 04:00 PM 6.5 (H) 4.8 - 5.6 % Final    Comment:    (NOTE) Pre diabetes:          5.7%-6.4%  Diabetes:              >6.4%  Glycemic control for   <7.0% adults with diabetes   07/04/2019 11:44 AM 6.7 (H) 4.8 - 5.6 % Final    Comment:    (NOTE) Pre diabetes:          5.7%-6.4% Diabetes:              >6.4% Glycemic control for   <7.0% adults with diabetes     CBG: Recent Labs  Lab 11/21/21 0715  GLUCAP 157*    Review of Systems:   10 point review of system taken, please see HPI for positives and negatives. Positive for lethargy Positive for back pain Denies fevers chills and sweats or sputum production  Past Medical History:   She,  has a past medical history of CAD (coronary artery disease) (11/04/2016), Cancer [facility]), Cerebrovascular disease, Chronic atrial fibrillation ([place]) (09/28/2018), Chronic bilateral low back pain without sciatica (03/08/2018), Chronic kidney disease, Chronic obstructive lung disease ([place]) (09/28/2018), Chronic pain syndrome (03/08/2018), Chronic renal failure (09/28/2018), Chronic, continuous use of opioids (03/04/2021), CKD (chronic kidney disease) stage 3, GFR 30-59 ml/min ([place]), Comprehensive diabetic foot examination, type 2 DM, encounter for ([place]) (04/29/2021), DDD (degenerative disc disease), lumbar (11/17/2018), Diabetes ([place]), Diabetes mellitus due to underlying condition with unspecified complications ([place]) (0/0/8676), Dyshidrotic eczema (  04/29/2021), Dysrhythmia, Essential hypertension (11/04/2016), Facet arthritis of lumbar region (03/08/2018), Facet joint disease (11/17/2018), Foraminal stenosis of lumbar region (03/08/2018), Heart murmur, History of knee replacement, total, bilateral (11/17/2018), Hypertension, Hypertensive disorder (09/28/2018), Increased body mass index (09/28/2018), Lumbar radiculopathy (03/08/2018), Lumbar spondylosis (03/20/2019), Mixed dyslipidemia (11/04/2016), Morbid obesity ([place]) (11/04/2016), Muscle pain (04/29/2020), Osteoarthritis of left knee, PAF (paroxysmal atrial fibrillation) ([place]) (04/30/2015), Pain management contract agreement (04/13/2019), Peripheral vascular disease ([place]) (09/28/2018), Preop cardiovascular exam (05/01/2019), Renal insufficiency (04/30/2015), S/P insertion of spinal cord stimulator (07/13/2019), Sleep apnea, Smoker (09/28/2018), Spinal stenosis of lumbar region (03/20/2019), Spinal stenosis of lumbar region with neurogenic claudication (03/08/2018), Spondylolisthesis (03/20/2019), Spondylosis of lumbosacral region without myelopathy or radiculopathy (01/30/2019), and Type 2 diabetes mellitus ([place]) (09/28/2018).   Surgical History:   Past Surgical History:  Procedure  Laterality Date   ABDOMINAL HYSTERECTOMY     BACK SURGERY     EYE SURGERY     bilateral cataracts   JOINT REPLACEMENT Bilateral    knees   SPINAL CORD STIMULATOR INSERTION N/A 07/07/2019   Procedure: LUMBAR SPINAL CORD STIMULATOR INSERTION;  Surgeon: Clydell Hakim, MD;  Location: [facility];  Service: Neurosurgery;  Laterality: N/A;   total left knee       Social History:   reports that she quit smoking about 2 years ago. Her smoking use included cigarettes. She has never used smokeless tobacco. She reports that she does not drink alcohol and does not use drugs.   Family History:  Her family history includes Diabetes in her mother.   Allergies No Known Allergies   Home Medications  Prior to Admission medications   Medication Sig Start Date End Date Taking? Authorizing Provider  aspirin 81 MG tablet Take 1 tablet (81 mg total) by mouth daily. 09/02/21   Consuella Lose, MD  bethanechol (URECHOLINE) 50 MG tablet Take 50-100 mg by mouth See admin instructions. Take 50 mg by mouth in the morning and 25 mg at night 08/12/21   [provider]  carvedilol (COREG) 12.5 MG tablet Take 12.5 mg by mouth 2 (two) times daily. 04/25/19   [provider]  fluticasone furoate-vilanterol (BREO ELLIPTA) 100-25 MCG/ACT AEPB Inhale 1 puff into the lungs daily.    [provider]  furosemide (LASIX) 40 MG tablet Take 40 mg by mouth daily.  02/19/18   [provider]  gabapentin (NEURONTIN) 600 MG tablet Take 600 mg by mouth at bedtime. 04/06/21   [provider]  HYDROcodone-acetaminophen (NORCO) 10-325 MG tablet Take 0.5 tablets by mouth every 6 (six) hours as needed for severe pain.    [provider]  loratadine (CLARITIN) 10 MG tablet Take 10 mg by mouth daily.    [provider]  losartan (COZAAR) 50 MG tablet Take 50 mg by mouth daily.    [provider]  lovastatin (MEVACOR) 40 MG tablet Take 40 mg by mouth at bedtime.     [provider]  methocarbamol (ROBAXIN) 500 MG tablet Take 1 tablet (500 mg total) by mouth every 6 (six) hours as needed for muscle spasms. 08/29/21   Consuella Lose, MD  Multiple Vitamin (MULTIVITAMIN WITH MINERALS) TABS tablet Take 1 tablet by mouth daily.    [provider]  nitrofurantoin, macrocrystal-monohydrate, (MACROBID) 100 MG capsule Take 100 mg by mouth daily. 07/30/21   [provider]  nitroGLYCERIN (NITROSTAT) 0.4 MG SL tablet Place 0.4 mg under the tongue every 5 (five) minutes as needed for chest pain.    [provider]  PROAIR HFA 108 (90 Base) MCG/ACT inhaler Inhale 1-2 puffs into the lungs every 4 (four) hours as needed for wheezing or shortness of breath.  10/12/16   [provider]  RYBELSUS 3 MG TABS Take 3 mg by mouth daily. 05/12/21   [provider]  spironolactone (ALDACTONE) 50 MG tablet Take 50 mg by mouth daily. 01/04/21   [provider]  tamsulosin (FLOMAX) 0.4 MG CAPS capsule Take 0.4 mg by mouth daily after supper. 06/10/19   [provider]  vitamin B-12 (CYANOCOBALAMIN) 500 MCG tablet Take 500 mcg by mouth daily.     [provider]     Critical care time: 45 min     Richardson Landry Ahlani Wickes ACNP Acute Care Nurse Practitioner [place] Please consult Amion 11/21/2021, 9:27 AM

## 2021-11-21 NOTE — Progress Notes (Signed)
Hypoglycemic Event  CBG: 64   Treatment: D50 50 mL (25 gm)  Symptoms: None  Follow-up CBG: Time:2157 CBG Result:105  Possible Reasons for Event: Inadequate meal intake     Sherren Mocha

## 2021-11-21 NOTE — Progress Notes (Signed)
[place] for heparin Indication: pulmonary embolus  Heparin Dosing Weight: 53.2 kg  Labs: Recent Labs    11/21/21 0721 11/21/21 0750 11/21/21 0835 11/21/21 1502 11/21/21 1925 11/21/21 1930  HGB 15.3* 14.7  --   --   --  12.5  HCT 45.0 47.7*  --   --   --  37.9  PLT  --  139*  --   --   --  130*  APTT  --   --  32 >200*  --   --   LABPROT  --   --  17.1* 16.3*  --   --   INR  --   --  1.4* 1.3*  --   --   HEPARINUNFRC  --   --   --  >1.10* 0.50  --   CREATININE 1.50* 1.49*  --   --   --   --     Assessment: 43 yof with hx prior strokes, PAF not on anticoagulation due to hx falls, L2-3 decompression and fusion in 08/2021 for cauda equina syndrome presenting as code stroke with L facial droop, AMS. Code stroke cancelled with low suspicion per Neurology and team signed off (imaging showed small old stroke but no acute findings). CTA did show saddle PE. Pharmacy consulted to dose heparin. Hg wnl, plt 137. SCr 1.5 (appears to be baseline) on presentation. No bleed issues reported.  Update: Heparin level 0.5 - therapeutic (aptt prolonged at 130) - going based on heparin level. H/H normal at 12.5/37.9. Platelets are low at 130 but stable from admission at 139. Arm bleeding has ceased with heparin off x1 hour. Will resume at slightly lower rate.   Goal of Therapy:  Heparin level 0.3-0.7 units/ml  Monitor platelets by anticoagulation protocol: Yes   Plan:  Restart IV Heparin at 850 units/hr  Recheck Heparin level and CBC in 8 hours- ok with AM labs.   Sloan Leiter, PharmD, BCPS, BCCCP Clinical Pharmacist Please refer to [facility] for [place] numbers 11/21/2021 8:22 PM

## 2021-11-21 NOTE — ED Notes (Signed)
Pt transported to CT 2 at this time

## 2021-11-21 NOTE — Progress Notes (Signed)
PCCM Brief Note:  Patient with massive pulmonary embolus as she had syncopal event at home being found down. She is receiving IV fluids for hypotension. Order for peripheral levophed placed if needed to maintain MAP > 65. Lactic acid is WNL. Awaiting troponin and BNP. Stat ECHO ordered and Stat CTA PE chest ordered. IR consulted for evaluation of thrombectomy. Patient's family updated at bedside.  Freda Jackson, MD [facility] Office: [phone]   See Amion for personal pager PCCM on call pager [phone] until 7pm. Please call [place] 7p-7a. [phone]

## 2021-11-21 NOTE — ED Provider Notes (Signed)
[facility] EMERGENCY DEPARTMENT Provider Note   CSN: [id number] Arrival date & time: 11/21/21  2542  An emergency department physician performed an initial assessment on this suspected stroke patient at 0716.  History  Chief Complaint  Patient presents with   Code Stroke    Lisa Maxwell is a 76 y.o. female.  HPI Patient arrives as a code stroke via EMS.  Last seen normal was 11 PM last night, 8.5 hours prior to ED arrival.  Reportedly she was in her usual state of health at that point.  Husband noticed facial droop this morning.  EMS reports the patient was present on their arrival but resolved in transport.  There was also reportedly extremity weakness, though this is been inconsistently described and not observed by EMS staff.  No report of pain.  No reported fall, trauma.  Update: Patient's husband now present.  History somewhat different.  Patient was last seen normal yesterday as above, but was found today not with focal weakness, but with unresponsiveness, slumped over in her bedside commode.    Home Medications Prior to Admission medications   Medication Sig Start Date End Date Taking? Authorizing Provider  aspirin 81 MG tablet Take 1 tablet (81 mg total) by mouth daily. 09/02/21   Consuella Lose, MD  bethanechol (URECHOLINE) 50 MG tablet Take 50-100 mg by mouth See admin instructions. Take 50 mg by mouth in the morning and 25 mg at night 08/12/21   [provider]  carvedilol (COREG) 12.5 MG tablet Take 12.5 mg by mouth 2 (two) times daily. 04/25/19   [provider]  fluticasone furoate-vilanterol (BREO ELLIPTA) 100-25 MCG/ACT AEPB Inhale 1 puff into the lungs daily.    [provider]  furosemide (LASIX) 40 MG tablet Take 40 mg by mouth daily.  02/19/18   [provider]  gabapentin (NEURONTIN) 600 MG tablet Take 600 mg by mouth at bedtime. 04/06/21   [provider]  HYDROcodone-acetaminophen (NORCO) 10-325 MG  tablet Take 0.5 tablets by mouth every 6 (six) hours as needed for severe pain.    [provider]  loratadine (CLARITIN) 10 MG tablet Take 10 mg by mouth daily.    [provider]  losartan (COZAAR) 50 MG tablet Take 50 mg by mouth daily.    [provider]  lovastatin (MEVACOR) 40 MG tablet Take 40 mg by mouth at bedtime.     [provider]  methocarbamol (ROBAXIN) 500 MG tablet Take 1 tablet (500 mg total) by mouth every 6 (six) hours as needed for muscle spasms. 08/29/21   Consuella Lose, MD  Multiple Vitamin (MULTIVITAMIN WITH MINERALS) TABS tablet Take 1 tablet by mouth daily.    [provider]  nitrofurantoin, macrocrystal-monohydrate, (MACROBID) 100 MG capsule Take 100 mg by mouth daily. 07/30/21   [provider]  nitroGLYCERIN (NITROSTAT) 0.4 MG SL tablet Place 0.4 mg under the tongue every 5 (five) minutes as needed for chest pain.    [provider]  PROAIR HFA 108 [phone] Base) MCG/ACT inhaler Inhale 1-2 puffs into the lungs every 4 (four) hours as needed for wheezing or shortness of breath.  10/12/16   [provider]  RYBELSUS 3 MG TABS Take 3 mg by mouth daily. 05/12/21   [provider]  spironolactone (ALDACTONE) 50 MG tablet Take 50 mg by mouth daily. 01/04/21   [provider]  tamsulosin (FLOMAX) 0.4 MG CAPS capsule Take 0.4 mg by mouth daily after supper. 06/10/19  [provider]  vitamin B-12 (CYANOCOBALAMIN) 500 MCG tablet Take 500 mcg by mouth daily.     [provider]      Allergies    Patient has no known allergies.    Review of Systems   Review of Systems  Unable to perform ROS: Acuity of condition    Physical Exam Updated Vital Signs BP (!) 93/52   Pulse (!) 101 Comment: Minor NP at bedside and aware  Temp (!) 96.3 F (35.7 C) (Axillary)   Resp (!) 26   Ht '4\' 8"'$  (1.422 m)   Wt 71.4 kg   SpO2 97% Comment: Minor NP at bedside and aware  BMI 35.29 kg/m   Physical Exam Vitals and nursing note reviewed.  Constitutional:      General: She is not in acute distress.    Appearance: She is well-developed.     Comments: Deconditioned appearing elderly female in no overt distress  HENT:     Head: Normocephalic and atraumatic.  Eyes:     Conjunctiva/sclera: Conjunctivae normal.  Cardiovascular:     Rate and Rhythm: Normal rate. Rhythm irregular.  Pulmonary:     Effort: Pulmonary effort is normal. No respiratory distress.     Breath sounds: Normal breath sounds. No stridor.  Abdominal:     General: There is no distension.  Skin:    General: Skin is warm and dry.  Neurological:     Mental Status: She is alert.     Cranial Nerves: No cranial nerve deficit.     Comments: Substantial atrophy, but the patient moves all extremities spontaneously and to command, minimally.  However she describes some limited secondary to generalized weakness and generalized pain.  Face is symmetric, speech is appropriate, clear, brief.  Psychiatric:        Mood and Affect: Mood normal.     ED Results / Procedures / Treatments   Labs (all labs ordered are listed, but only abnormal results are displayed) Labs Reviewed  CBC - Abnormal; Notable for the following components:      Result Value   HCT 47.7 (*)    Platelets 139 (*)    All other components within normal limits  COMPREHENSIVE METABOLIC PANEL - Abnormal; Notable for the following components:   CO2 18 (*)    Glucose, Bld 157 (*)    BUN 25 (*)    Creatinine, Ser 1.49 (*)    Total Protein 5.6 (*)    Albumin 3.3 (*)    GFR, Estimated 36 (*)    All other components within normal limits  PROTIME-INR - Abnormal; Notable for the following components:   Prothrombin Time 17.1 (*)    INR 1.4 (*)    All other components within normal limits  CBG MONITORING, ED - Abnormal; Notable for the following components:   Glucose-Capillary 157 (*)    All other components within normal limits  I-STAT CHEM 8, ED -  Abnormal; Notable for the following components:   BUN 30 (*)    Creatinine, Ser 1.50 (*)    Glucose, Bld 159 (*)    TCO2 21 (*)    Hemoglobin 15.3 (*)    All other components within normal limits  RESP PANEL BY RT-PCR (FLU A&B, COVID) ARPGX2  CULTURE, BLOOD (ROUTINE X 2)  CULTURE, BLOOD (ROUTINE X 2)  ETHANOL  DIFFERENTIAL  LACTIC ACID, PLASMA  APTT  RAPID URINE DRUG SCREEN, HOSP PERFORMED  URINALYSIS, ROUTINE W REFLEX MICROSCOPIC  LACTIC ACID, PLASMA  HEPARIN LEVEL (UNFRACTIONATED)    EKG EKG Interpretation  Date/Time:  Friday November 21 2021 08:30:58 EDT Ventricular Rate:  98 PR Interval:    QRS Duration: 137 QT Interval:  410 QTC Calculation: 510 R Axis:   94 Text Interpretation: Atrial fibrillation Artifact T wave abnormality Abnormal ECG Confirmed by Carmin Muskrat [phone]) on 11/21/2021 8:56:52 AM  Radiology DG Chest Port 1 View  Result Date: 11/21/2021 CLINICAL DATA:  76 year old female with saddle embolus identified on CTA for code stroke this morning. EXAM: PORTABLE CHEST 1 VIEW COMPARISON:  CTA head and neck 0731 hours. Prior chest radiographs 01/26/2018. FINDINGS: Portable AP semi upright view at 0836 hours. The patient is rotated to the right. Mediastinal contours not significantly changed since 2019. Allowing for portable technique the lungs are clear. No pneumothorax or pleural effusion. Visualized tracheal air column is within normal limits. Partially visible lumbar fusion hardware. Thoracic spinal stimulator device is new since 2019. No acute osseous abnormality identified. Paucity of bowel gas. IMPRESSION: Lungs remain clear despite Saddle Embolus demonstrated on CTA Neck this morning. Electronically Signed   By: Genevie Ann M.D.   On: 11/21/2021 08:52   CT ANGIO HEAD NECK W WO CM (CODE STROKE)  Result Date: 11/21/2021 CLINICAL DATA:  Code stroke.  76 year old female EXAM: CT ANGIOGRAPHY HEAD AND NECK TECHNIQUE: Multidetector CT imaging of the head and neck was  performed using the standard protocol during bolus administration of intravenous contrast. Multiplanar CT image reconstructions and MIPs were obtained to evaluate the vascular anatomy. Carotid stenosis measurements (when applicable) are obtained utilizing NASCET criteria, using the distal internal carotid diameter as the denominator. RADIATION DOSE REDUCTION: This exam was performed according to the departmental dose-optimization program which includes automated exposure control, adjustment of the mA and/or kV according to patient size and/or use of iterative reconstruction technique. CONTRAST:  60m OMNIPAQUE IOHEXOL 350 MG/ML SOLN COMPARISON:  Plain head CT 0722 hours today[facility] FINDINGS: CTA NECK Skeleton: Absent dentition. Widespread cervical spine degeneration. Upper thoracic disc and endplate degeneration. No acute osseous abnormality identified. Upper chest: Acute pulmonary embolus with SADDLE EMBOLUS VISIBLE. Bilateral lobar clot. Some underlying emphysema, but visible lung parenchyma is clear. No pleural effusion. Partially visible material in the left mainstem bronchus. No superior mediastinal lymphadenopathy. Other neck: Chronic polypoid opacity left nasopharynx series 19, image 63 not significantly changed from last year. No superimposed cervical lymphadenopathy identified. No other neck mass. Aortic arch: 3 vessel arch configuration. Mildly tortuous aortic arch. Mild to moderate plaque in the distal arch and proximal descending thoracic aorta. Right carotid system: Brachiocephalic soft and calcified plaque without stenosis. Negative right CCA. Calcified plaque at the medial left ICA origin and bulb but less than 50% stenosis with respect to the distal vessel. Tortuous right ICA at C1. Left carotid system: Negative left CCA. Mild left ICA calcified plaque without stenosis. Vertebral arteries: Proximal right subclavian soft and calcified plaque without stenosis.  Normal right vertebral artery origin. Right vertebral artery is patent without stenosis to the skull base. Soft and calcified proximal left subclavian artery plaque without stenosis. Bulky similar plaque at the left vertebral origin resulting in severe stenosis (series 12, image 125). Despite string-sign appearing stenosis the left vertebral artery remains patent, and is fairly codominant. Left vertebral remains patent to the skull base with no additional stenosis. CTA HEAD Posterior circulation: Bilateral V4 segment calcified plaque with mild stenosis. Right V4 segment appears somewhat dominant. Normal left PICA origin and dominant appearing  right AICA. Patent vertebrobasilar junction and basilar artery without stenosis. SCA and PCA origins are normal. Posterior communicating arteries are diminutive or absent. Bilateral PCA branches are within normal limits. Anterior circulation: Both ICA siphons are patent with calcified plaque. Mild supraclinoid siphon stenosis on the right side. Patent carotid termini, MCA and ACA origins. Normal anterior communicating artery and bilateral ACA branches. Left MCA M1 segment and bifurcation are patent without stenosis. Right MCA M1 segment and bifurcation are patent without stenosis. Bilateral MCA branches are within normal limits. Venous sinuses: Patent. Anatomic variants: Mildly dominant right vertebral artery. Review of the MIP images confirms the above findings Critical Value/emergent results were called by telephone at the time of interpretation on 11/21/2021 at 7:48 am to Dr. Donnetta Simpers and ED Dr. Vanita Panda, who verbally acknowledged these results. IMPRESSION: 1. Positive for Acute PE with Saddle Embolus and bilateral lobar thrombus visible in the upper chest. Critical Value/emergent results were called by telephone at the time of interpretation on 11/21/2021 at 7:48 am to Dr. Donnetta Simpers and ED Dr. Vanita Panda, who verbally acknowledged these results. 2. Negative for  ELVO. Very Severe atherosclerotic stenosis at the Left Vertebral Artery origin, but the vessel remains patent. Generally mild for age atherosclerosis elsewhere in the head and neck. Aortic Atherosclerosis (ICD10-I70.0). 3. Chronic Left nasopharynx polyp, not significantly changed from last year. Recommend follow-up with ENT for direct visualization. Electronically Signed   By: Genevie Ann M.D.   On: 11/21/2021 07:57   CT HEAD CODE STROKE WO CONTRAST  Addendum Date: 11/21/2021   ADDENDUM REPORT: 11/21/2021 07:44 ADDENDUM: Study discussed by telephone with Dr. Donnetta Simpers on 11/21/2021 at 0739 hours. Electronically Signed   By: Genevie Ann M.D.   On: 11/21/2021 07:44   Result Date: 11/21/2021 CLINICAL DATA:  Code stroke.  76 year old female EXAM: CT HEAD WITHOUT CONTRAST TECHNIQUE: Contiguous axial images were obtained from the base of the skull through the vertex without intravenous contrast. RADIATION DOSE REDUCTION: This exam was performed according to the departmental dose-optimization program which includes automated exposure control, adjustment of the mA and/or kV according to patient size and/or use of iterative reconstruction technique. COMPARISON:  Head CT 12/20/2020. FINDINGS: Brain: Stable cerebral volume. No midline shift, ventriculomegaly, mass effect, evidence of mass lesion, intracranial hemorrhage or evidence of cortically based acute infarction. Small area of chronic encephalomalacia in the left occipital pole is stable from last year. Elsewhere patchy bilateral white matter hypodensity appears fairly stable and symmetric. Deep gray nuclei, brainstem and cerebellum appear negative. Vascular: Calcified atherosclerosis at the skull base. No suspicious intracranial vascular hyperdensity. Skull: Clivus appears to remain intact. No acute osseous abnormality identified. Sinuses/Orbits: Visualized paranasal sinuses and mastoids are stable and well aerated. Other: No acute orbit or scalp soft tissue  finding. Lobulated chronic polypoid lesion in the posterior left nasal cavity appears stable from last year on series 4, image 6. ASPECTS [facility] Stroke Program Early CT Score) Total score (0-10 with 10 being normal): Ten IMPRESSION: 1. No acute cortically based infarct or acute intracranial hemorrhage identified. ASPECTS 10. 2. Small chronic Left PCA territory infarct. Mild for age bilateral white matter changes. 3. Chronic left nasal cavity or nasopharynx polypoid soft tissue lesion, not significantly changed from last year favoring benign etiology. Recommend follow-up with ENT for direct visualization. Electronically Signed: By: Genevie Ann M.D. On: 11/21/2021 07:31    Procedures Procedures    Medications Ordered in ED Medications  heparin ADULT infusion 100 units/mL (25000 units/272m) (950 Units/hr Intravenous  New Bag/Given 11/21/21 0842)  iohexol (OMNIPAQUE) 350 MG/ML injection 50 mL (50 mLs Intravenous Contrast Given 11/21/21 0738)  sodium chloride 0.9 % bolus 1,000 mL (1,000 mLs Intravenous New Bag/Given 11/21/21 0802)  heparin bolus via infusion 4,000 Units (4,000 Units Intravenous Bolus from Bag 11/21/21 0842)  sodium chloride 0.9 % bolus 1,000 mL (1,000 mLs Intravenous New Bag/Given 11/21/21 0843)    ED Course/ Medical Decision Making/ A&P This patient with a Hx of renal dysfunction, obesity, htn, dm, Afib presents to the ED for concern of facial droop +/- extremity weakness, this involves an extensive number of treatment options, and is a complaint that carries with it a high risk of complications and morbidity.    The differential diagnosis includes CVA, infection,    Social Determinants of Health:  Obesity, age, MMP  Additional history obtained:  Additional history and/or information obtained from EMS, , notable for details included in HPI   After the initial evaluation, orders, including: CT, labs, monitoring were initiated.   Patient placed on Cardiac and Pulse-Oximetry  Monitors. The patient was maintained on a cardiac monitor.  The cardiac monitored showed an rhythm of A-fib 100 abnormal The patient was also maintained on pulse oximetry. The readings were typically 95% with 4 L via nasal cannula abnormal   On repeat evaluation of the patient stayed the same  Lab Tests:  I personally interpreted labs.  The pertinent results include: Persistent renal dysfunction, no lactic acidosis  Imaging Studies ordered:  I independently visualized and interpreted imaging which showed pulmonary embolism, saddle PE, no acute intracranial hemorrhage. I agree with the radiologist interpretation  Consultations Obtained:  I requested consultation with the neurology, critical care,  and discussed lab and imaging findings as well as pertinent plan - they recommend: Heparin, admission  Dispostion / Final MDM:  After consideration of the diagnostic results and the patient's response to treatment, adult female is presenting after being found unresponsive by her husband.  Patient is awake and alert, answering questions appropriately.  Global weakness is appreciated, but no new focal neurologic deficit.  Though the patient was initially designated as a code stroke, her presentation is more consistent with an episode of unresponsiveness, and findings are most concerning for pulmonary embolism with new oxygen requirement.  Patient has baseline hypotension, this is worse in the ED, she received fluid resuscitation as well as initiation of heparin, critical care consultation, and case was managed initially with her neurologist subsequently with critical care.  Patient admitted for ongoing monitoring, management of pulmonary embolism.  Final Clinical Impression(s) / ED Diagnoses Final diagnoses:  Acute saddle pulmonary embolism with acute cor pulmonale ([place])   CRITICAL CARE Performed by: Carmin Muskrat Total critical care time: 35 minutes Critical care time was exclusive of  separately billable procedures and treating other patients. Critical care was necessary to treat or prevent imminent or life-threatening deterioration. Critical care was time spent personally by me on the following activities: development of treatment plan with patient and/or surrogate as well as nursing, discussions with consultants, evaluation of patient's response to treatment, examination of patient, obtaining history from patient or surrogate, ordering and performing treatments and interventions, ordering and review of laboratory studies, ordering and review of radiographic studies, pulse oximetry and re-evaluation of patient's condition.    Carmin Muskrat, MD 11/21/21 [phone]

## 2021-11-21 NOTE — ED Notes (Signed)
CCM MD at bedside.  

## 2021-11-21 NOTE — ED Notes (Signed)
Called lab to have troponin and BNP added to previous samples. Per lab they have not received the labs in and they are doing the best they can. This RN emphasized the importance of prioritizing these labs to prevent delay in critical care needed. Notified CCM providers.

## 2021-11-21 NOTE — ED Notes (Signed)
Labs drawn to try to expedite BNP and trop results. Labs given to phlebotomy and phlebotomy is tubing them to main lab

## 2021-11-21 NOTE — ED Notes (Signed)
IR provider at bedside talking w/ family. Notified provider of the delays.

## 2021-11-21 NOTE — Progress Notes (Signed)
Post aspiration main pulmonary artery pressure 33

## 2021-11-22 ENCOUNTER — Telehealth: Payer: Self-pay | Admitting: Critical Care Medicine

## 2021-11-22 DIAGNOSIS — N179 Acute kidney failure, unspecified: Secondary | ICD-10-CM | POA: Diagnosis not present

## 2021-11-22 DIAGNOSIS — J9601 Acute respiratory failure with hypoxia: Secondary | ICD-10-CM

## 2021-11-22 DIAGNOSIS — I2602 Saddle embolus of pulmonary artery with acute cor pulmonale: Secondary | ICD-10-CM | POA: Diagnosis not present

## 2021-11-22 LAB — CBC
HCT: 35.8 % — ABNORMAL LOW (ref 36.0–46.0)
HCT: 34.9 % — ABNORMAL LOW (ref 36.0–46.0)
Hemoglobin: 11.6 g/dL — ABNORMAL LOW (ref 12.0–15.0)
Hemoglobin: 11.5 g/dL — ABNORMAL LOW (ref 12.0–15.0)
MCH: 31 pg (ref 26.0–34.0)
MCH: 31.1 pg (ref 26.0–34.0)
MCHC: 32.4 g/dL (ref 30.0–36.0)
MCHC: 33 g/dL (ref 30.0–36.0)
MCV: 95.7 fL (ref 80.0–100.0)
MCV: 94.3 fL (ref 80.0–100.0)
Platelets: 125 10*3/uL — ABNORMAL LOW (ref 150–400)
Platelets: 133 10*3/uL — ABNORMAL LOW (ref 150–400)
RBC: 3.74 MIL/uL — ABNORMAL LOW (ref 3.87–5.11)
RBC: 3.7 MIL/uL — ABNORMAL LOW (ref 3.87–5.11)
RDW: 13.9 % (ref 11.5–15.5)
RDW: 14 % (ref 11.5–15.5)
WBC: 6.4 10*3/uL (ref 4.0–10.5)
WBC: 6.9 10*3/uL (ref 4.0–10.5)
nRBC: 0 % (ref 0.0–0.2)
nRBC: 0 % (ref 0.0–0.2)

## 2021-11-22 LAB — BASIC METABOLIC PANEL
Anion gap: 7 (ref 5–15)
BUN: 21 mg/dL (ref 8–23)
CO2: 18 mmol/L — ABNORMAL LOW (ref 22–32)
Calcium: 8.9 mg/dL (ref 8.9–10.3)
Chloride: 112 mmol/L — ABNORMAL HIGH (ref 98–111)
Creatinine, Ser: 1.3 mg/dL — ABNORMAL HIGH (ref 0.44–1.00)
GFR, Estimated: 43 mL/min — ABNORMAL LOW (ref >60)
Glucose, Bld: 132 mg/dL — ABNORMAL HIGH (ref 70–99)
Potassium: 4.5 mmol/L (ref 3.5–5.1)
Sodium: 137 mmol/L (ref 135–145)

## 2021-11-22 LAB — GLUCOSE, CAPILLARY
Glucose-Capillary: 128 mg/dL — ABNORMAL HIGH (ref 70–99)
Glucose-Capillary: 143 mg/dL — ABNORMAL HIGH (ref 70–99)
Glucose-Capillary: 148 mg/dL — ABNORMAL HIGH (ref 70–99)
Glucose-Capillary: 159 mg/dL — ABNORMAL HIGH (ref 70–99)
Glucose-Capillary: 161 mg/dL — ABNORMAL HIGH (ref 70–99)

## 2021-11-22 LAB — HEPARIN LEVEL (UNFRACTIONATED): Heparin Unfractionated: 0.28 IU/mL — ABNORMAL LOW (ref 0.30–0.70)

## 2021-11-22 MED ORDER — GABAPENTIN 600 MG PO TABS
600.0000 mg | ORAL_TABLET | Freq: Three times a day (TID) | ORAL | Status: DC
  Administered 2021-11-22 – 2021-11-24 (×6): 600 mg via ORAL
  Filled 2021-11-22 (×7): qty 1

## 2021-11-22 MED ORDER — SPIRONOLACTONE 25 MG PO TABS
50.0000 mg | ORAL_TABLET | Freq: Every day | ORAL | Status: DC
Start: 2021-11-22 — End: 2021-11-22

## 2021-11-22 MED ORDER — FLUTICASONE FUROATE-VILANTEROL 100-25 MCG/ACT IN AEPB
1.0000 | INHALATION_SPRAY | Freq: Every day | RESPIRATORY_TRACT | Status: DC
Start: 2021-11-22 — End: 2021-11-23
  Administered 2021-11-22 – 2021-11-23 (×2): 1 via RESPIRATORY_TRACT
  Filled 2021-11-22: qty 28

## 2021-11-22 MED ORDER — APIXABAN 5 MG PO TABS: 5.0000 mg | ORAL_TABLET | Freq: Two times a day (BID) | ORAL | Status: DC

## 2021-11-22 MED ORDER — PRAVASTATIN SODIUM 10 MG PO TABS
10.0000 mg | ORAL_TABLET | Freq: Every day | ORAL | Status: DC
  Administered 2021-11-22 – 2021-11-23 (×2): 10 mg via ORAL
  Filled 2021-11-22 (×2): qty 1

## 2021-11-22 MED ORDER — APIXABAN 5 MG PO TABS
10.0000 mg | ORAL_TABLET | Freq: Two times a day (BID) | ORAL | Status: DC
  Administered 2021-11-22 – 2021-11-24 (×4): 10 mg via ORAL
  Filled 2021-11-22 (×4): qty 2

## 2021-11-22 MED ORDER — METOPROLOL TARTRATE 12.5 MG HALF TABLET
12.5000 mg | ORAL_TABLET | Freq: Two times a day (BID) | ORAL | Status: DC
  Administered 2021-11-22 – 2021-11-24 (×4): 12.5 mg via ORAL
  Filled 2021-11-22 (×4): qty 1

## 2021-11-22 NOTE — Discharge Instructions (Signed)
Information on my medicine - ELIQUIS (apixaban)  This medication education was reviewed with me or my healthcare representative as part of my discharge preparation.   Why was Eliquis prescribed for you? Eliquis was prescribed to treat blood clots that may have been found in the veins of your legs (deep vein thrombosis) or in your lungs (pulmonary embolism) and to reduce the risk of them occurring again.  What do You need to know about Eliquis ? The starting dose is 10 mg (two 5 mg tablets) taken TWICE daily for the FIRST SEVEN (7) DAYS, then on (enter date)  11/29/21  the dose is reduced to ONE 5 mg tablet taken TWICE daily.  Eliquis may be taken with or without food.   Try to take the dose about the same time in the morning and in the evening. If you have difficulty swallowing the tablet whole please discuss with your pharmacist how to take the medication safely.  Take Eliquis exactly as prescribed and DO NOT stop taking Eliquis without talking to the doctor who prescribed the medication.  Stopping may increase your risk of developing a new blood clot.  Refill your prescription before you run out.  After discharge, you should have regular check-up appointments with your healthcare provider that is prescribing your Eliquis.    What do you do if you miss a dose? If a dose of ELIQUIS is not taken at the scheduled time, take it as soon as possible on the same day and twice-daily administration should be resumed. The dose should not be doubled to make up for a missed dose.  Important Safety Information A possible side effect of Eliquis is bleeding. You should call your healthcare provider right away if you experience any of the following: Bleeding from an injury or your nose that does not stop. Unusual colored urine (red or dark brown) or unusual colored stools (red or black). Unusual bruising for unknown reasons. A serious fall or if you hit your head (even if there is no  bleeding).  Some medicines may interact with Eliquis and might increase your risk of bleeding or clotting while on Eliquis. To help avoid this, consult your healthcare provider or pharmacist prior to using any new prescription or non-prescription medications, including herbals, vitamins, non-steroidal anti-inflammatory drugs (NSAIDs) and supplements.  This website has more information on Eliquis (apixaban): http://www.eliquis.com/eliquis/home

## 2021-11-22 NOTE — Progress Notes (Signed)
Referring Physician(s): Dr Charm Barges   Supervising Physician: Michaelle Birks  Patient Status:  [name] - In-pt  Chief Complaint:  Massive PE. Pulmonary HTN w R heart dysfunction.  Subjective:  IR procedure 8/18: PULMONARY ARTERIOGRAPHY MECHANICAL THROMBECTOMY, Catheter-directed   Pt is feeling better today Breathing easier per pt 98% on 3L Resting comfortably in bed   Allergies: Patient has no known allergies.  Medications: Prior to Admission medications   Medication Sig Start Date End Date Taking? Authorizing Provider  aspirin 81 MG tablet Take 1 tablet (81 mg total) by mouth daily. Patient taking differently: Take 81 mg by mouth in the morning. 09/02/21  Yes Consuella Lose, MD  carvedilol (COREG) 12.5 MG tablet Take 12.5 mg by mouth 2 (two) times daily. 04/25/19  Yes [provider]  Coenzyme Q10 (CO Q-10) 200 MG CAPS Take 200 mg by mouth in the morning.   Yes [provider]  Cyanocobalamin (VITAMIN B-12) 500 MCG SUBL Place 500 mcg under the tongue in the morning.   Yes [provider]  diphenhydrAMINE (BENADRYL) 25 MG tablet Take 25 mg by mouth in the morning.   Yes [provider]  docusate sodium (COLACE) 100 MG capsule Take 100 mg by mouth daily. 10/08/21  Yes [provider]  fluticasone furoate-vilanterol (BREO ELLIPTA) 100-25 MCG/ACT AEPB Inhale 1-2 puffs into the lungs daily.   Yes [provider]  furosemide (LASIX) 40 MG tablet Take 40 mg by mouth in the morning. 02/19/18  Yes [provider]  gabapentin (NEURONTIN) 600 MG tablet Take 600 mg by mouth at bedtime. 04/06/21  Yes [provider]  HYDROcodone-acetaminophen (NORCO) 10-325 MG tablet Take 0.5 tablets by mouth 4 (four) times daily.   Yes [provider]  Iron, Ferrous Sulfate, 325 (65 Fe) MG TABS Take 65 mg of iron by mouth daily with breakfast.   Yes [provider]  latanoprost (XALATAN) 0.005 % ophthalmic solution  Place 1 drop into both eyes at bedtime. 11/17/21  Yes [provider]  lovastatin (MEVACOR) 40 MG tablet Take 40 mg by mouth at bedtime.    Yes [provider]  methocarbamol (ROBAXIN) 500 MG tablet Take 1 tablet (500 mg total) by mouth every 6 (six) hours as needed for muscle spasms. Patient taking differently: Take 250 mg by mouth See admin instructions. Take 250 mg by mouth three to four times a day 08/29/21  Yes Consuella Lose, MD  Multiple Vitamins-Minerals (CENTRUM SILVER 50+WOMEN) TABS Take 1 tablet by mouth daily with breakfast.   Yes [provider]  naloxone (NARCAN) nasal spray 4 mg/0.1 mL Place 1 spray into the nose daily as needed (accidental overdose).   Yes [provider]  nitroGLYCERIN (NITROSTAT) 0.4 MG SL tablet Place 0.4 mg under the tongue every 5 (five) minutes as needed for chest pain.   Yes [provider]  PROAIR HFA 108 (90 Base) MCG/ACT inhaler Inhale 2 puffs into the lungs every 4 (four) hours as needed for wheezing or shortness of breath. 10/12/16  Yes [provider]  RYBELSUS 3 MG TABS Take 3 mg by mouth daily before breakfast. 05/12/21  Yes [provider]  spironolactone (ALDACTONE) 50 MG tablet Take 25 mg by mouth in the morning.   Yes [provider]  tamsulosin (FLOMAX) 0.4 MG CAPS capsule Take 0.4 mg by mouth 2 (two) times daily. 06/10/19  Yes [provider]  triamcinolone ointment (KENALOG) 0.5 % Apply 1 Application topically See admin instructions.  Apply to affected areas of both feet two to three times a week   Yes [provider]  trimethoprim (TRIMPEX) 100 MG tablet Take 100 mg by mouth in the morning. 10/25/21  Yes [provider]     Vital Signs: BP 113/73   Pulse 98   Temp 97.8 F (36.6 C) (Oral)   Resp 17   Ht '4\' 8"'$  (1.422 m)   Wt 162 lb 11.2 oz (73.8 kg)   SpO2 97%   BMI 36.48 kg/m   Physical Exam Vitals reviewed.  Pulmonary:     Effort:  Pulmonary effort is normal.     Breath sounds: No wheezing.  Abdominal:     Palpations: Abdomen is soft.  Skin:    General: Skin is warm.     Comments: Rt groin site is clean and dry NT no bleeding  Neurological:     Mental Status: She is alert and oriented to person, place, and time.     Imaging: ECHOCARDIOGRAM COMPLETE  Result Date: 11/21/2021    ECHOCARDIOGRAM REPORT   Patient Name:   Lisa Maxwell [facility] Date of Exam: 11/21/2021 Medical Rec #:  [id number]      Height:       56.0 in Accession #:    [id number]     Weight:       157.4 lb Date of Birth:  04-Jun-1945      BSA:          1.603 m Patient Age:    76 years       BP:           87/30 mmHg Patient Gender: F              HR:           101 bpm. Exam Location:  Inpatient Procedure: 2D Echo, Color Doppler and Cardiac Doppler Indications:    Pulmonary embolism  History:        Patient has no prior history of Echocardiogram examinations.                 CAD; Risk Factors:Diabetes, Hypertension and Sleep Apnea.  Sonographer:    Jefferey Pica Referring Phys: [id number] [place]  1. Left ventricular ejection fraction, by estimation, is 60 to 65%. The left ventricle has normal function. The left ventricle has no regional wall motion abnormalities. There is mild left ventricular hypertrophy. Left ventricular diastolic parameters are indeterminate. There is the interventricular septum is flattened in diastole ('D' shaped left ventricle), consistent with right ventricular volume overload.  2. McConnell's Sign. Right ventricular systolic function is moderately reduced. The right ventricular size is moderately enlarged. There is severely elevated pulmonary artery systolic pressure. The estimated right ventricular systolic pressure is 46.2 mmHg.  3. The mitral valve is normal in structure. No evidence of mitral valve regurgitation.  4. Tricuspid valve regurgitation is mild to moderate.  5. There is mild calcification of the aortic valve. Aortic  valve regurgitation is not visualized.  6. The inferior vena cava is dilated in size with <50% respiratory variability, suggesting right atrial pressure of 15 mmHg. Conclusion(s)/Recommendation(s): Evidence of RV strain with diagnosis of PE. FINDINGS  Left Ventricle: Left ventricular ejection fraction, by estimation, is 60 to 65%. The left ventricle has normal function. The left ventricle has no regional wall motion abnormalities. The left ventricular internal cavity size was normal in size. There is  mild left ventricular hypertrophy. The interventricular septum is flattened in diastole ('D'  shaped left ventricle), consistent with right ventricular volume overload. Left ventricular diastolic parameters are indeterminate. Right Ventricle: McConnell's Sign. The right ventricular size is moderately enlarged. No increase in right ventricular wall thickness. Right ventricular systolic function is moderately reduced. There is severely elevated pulmonary artery systolic pressure. The tricuspid regurgitant velocity is 3.52 m/s, and with an assumed right atrial pressure of 15 mmHg, the estimated right ventricular systolic pressure is 54.2 mmHg. Left Atrium: Left atrial size was normal in size. Right Atrium: Right atrial size was normal in size. Pericardium: There is no evidence of pericardial effusion. Presence of epicardial fat layer. Mitral Valve: The mitral valve is normal in structure. No evidence of mitral valve regurgitation. Tricuspid Valve: The tricuspid valve is normal in structure. Tricuspid valve regurgitation is mild to moderate. Aortic Valve: There is mild calcification of the aortic valve. Aortic valve regurgitation is not visualized. Aortic valve peak gradient measures 3.7 mmHg. Pulmonic Valve: Pulmonic valve regurgitation is not visualized. Aorta: The aortic root and ascending aorta are structurally normal, with no evidence of dilitation. Venous: The inferior vena cava is dilated in size with less than 50%  respiratory variability, suggesting right atrial pressure of 15 mmHg. IAS/Shunts: No atrial level shunt detected by color flow Doppler.  LEFT VENTRICLE PLAX 2D LVIDd:         3.20 cm LVIDs:         2.60 cm LV PW:         1.30 cm LV IVS:        1.30 cm LVOT diam:     1.80 cm LV SV:         28 LV SV Index:   17 LVOT Area:     2.54 cm  RIGHT VENTRICLE            IVC RV Basal diam:  2.90 cm    IVC diam: 2.60 cm RV Mid diam:    4.10 cm RV S prime:     8.05 cm/s LEFT ATRIUM           Index        RIGHT ATRIUM           Index LA diam:      3.70 cm 2.31 cm/m   RA Area:     16.00 cm LA Vol (A2C): 47.3 ml 29.50 ml/m  RA Volume:   37.70 ml  23.51 ml/m LA Vol (A4C): 46.0 ml 28.69 ml/m  AORTIC VALVE                 PULMONIC VALVE AV Area (Vmax): 1.94 cm     PV Vmax:       0.66 m/s AV Vmax:        95.72 cm/s   PV Peak grad:  1.7 mmHg AV Peak Grad:   3.7 mmHg LVOT Vmax:      73.10 cm/s LVOT Vmean:     41.050 cm/s LVOT VTI:       0.108 m  AORTA Ao Root diam: 3.00 cm Ao Asc diam:  3.20 cm MITRAL VALVE               TRICUSPID VALVE MV Area (PHT): 4.05 cm    TR Peak grad:   49.6 mmHg MV Decel Time: 187 msec    TR Vmax:        352.00 cm/s MV E velocity: 79.36 cm/s MV A velocity: 63.40 cm/s  SHUNTS MV E/A ratio:  1.25  Systemic VTI:  0.11 m                            Systemic Diam: 1.80 cm Phineas Inches Electronically signed by Phineas Inches Signature Date/Time: 11/21/2021/11:13:15 AM    Final    CT Angio Chest Pulmonary Embolism (PE) W or WO Contrast  Result Date: 11/21/2021 CLINICAL DATA:  Pulmonary embolism on CTA neck of earlier in the day. EXAM: CT ANGIOGRAPHY CHEST WITH CONTRAST TECHNIQUE: Multidetector CT imaging of the chest was performed using the standard protocol during bolus administration of intravenous contrast. Multiplanar CT image reconstructions and MIPs were obtained to evaluate the vascular anatomy. RADIATION DOSE REDUCTION: This exam was performed according to the departmental dose-optimization program  which includes automated exposure control, adjustment of the mA and/or kV according to patient size and/or use of iterative reconstruction technique. CONTRAST:  28m OMNIPAQUE IOHEXOL 350 MG/ML SOLN COMPARISON:  Plain film of earlier today. CTA head of 11/22/2018 lung cancer screening [facility] FINDINGS: Cardiovascular: Saddle embolism again identified, with clot continuing into left-greater-than-right lobar pulmonary artery branches. Example 56/5 and 67/5. The RV/LV ratio is increased, on the order of 4.6/2.1. Aortic atherosclerosis. Mild cardiomegaly with trace pericardial fluid. Multivessel coronary artery atherosclerosis. Pulmonary artery enlargement, outflow tract 3.3 cm Mediastinum/Nodes: No mediastinal or hilar adenopathy. Lungs/Pleura: No pleural fluid. Mild centrilobular and paraseptal emphysema. Upper Abdomen: Normal imaged portions of the liver, spleen stomach, adrenal glands. Possible upper pole left renal collecting system punctate calculi. Trace perihepatic ascites Musculoskeletal: Dorsal spinal stimulator. Advanced thoracic spondylosis. Review of the MIP images confirms the above findings. IMPRESSION: 1. Redemonstration of saddle pulmonary embolus. Positive for acute PE with CT evidence of right heart strain (RV/LV Ratio = 2.2) consistent with at least submassive (intermediate risk) PE. The presence of right heart strain has been associated with an increased risk of morbidity and mortality. Please refer to the "Code PE Focused" order set in EPIC. 2. Aortic atherosclerosis (ICD10-I70.0), coronary artery atherosclerosis and emphysema (ICD10-J43.9). Electronically Signed   By: KAbigail MiyamotoM.D.   On: 11/21/2021 10:35   DG Chest Port 1 View  Result Date: 11/21/2021 CLINICAL DATA:  75year old female with saddle embolus identified on CTA for code stroke this morning. EXAM: PORTABLE CHEST 1 VIEW COMPARISON:  CTA head and neck 0731 hours. Prior chest radiographs 01/26/2018.  FINDINGS: Portable AP semi upright view at 0836 hours. The patient is rotated to the right. Mediastinal contours not significantly changed since 2019. Allowing for portable technique the lungs are clear. No pneumothorax or pleural effusion. Visualized tracheal air column is within normal limits. Partially visible lumbar fusion hardware. Thoracic spinal stimulator device is new since 2019. No acute osseous abnormality identified. Paucity of bowel gas. IMPRESSION: Lungs remain clear despite Saddle Embolus demonstrated on CTA Neck this morning. Electronically Signed   By: HGenevie AnnM.D.   On: 11/21/2021 08:52   CT ANGIO HEAD NECK W WO CM (CODE STROKE)  Result Date: 11/21/2021 CLINICAL DATA:  Code stroke.  76year old female EXAM: CT ANGIOGRAPHY HEAD AND NECK TECHNIQUE: Multidetector CT imaging of the head and neck was performed using the standard protocol during bolus administration of intravenous contrast. Multiplanar CT image reconstructions and MIPs were obtained to evaluate the vascular anatomy. Carotid stenosis measurements (when applicable) are obtained utilizing NASCET criteria, using the distal internal carotid diameter as the denominator. RADIATION DOSE REDUCTION: This exam was performed according to the departmental dose-optimization program  which includes automated exposure control, adjustment of the mA and/or kV according to patient size and/or use of iterative reconstruction technique. CONTRAST:  1m OMNIPAQUE IOHEXOL 350 MG/ML SOLN COMPARISON:  Plain head CT 0722 hours today. [facility]/2020. FINDINGS: CTA NECK Skeleton: Absent dentition. Widespread cervical spine degeneration. Upper thoracic disc and endplate degeneration. No acute osseous abnormality identified. Upper chest: Acute pulmonary embolus with SADDLE EMBOLUS VISIBLE. Bilateral lobar clot. Some underlying emphysema, but visible lung parenchyma is clear. No pleural effusion. Partially visible material in the  left mainstem bronchus. No superior mediastinal lymphadenopathy. Other neck: Chronic polypoid opacity left nasopharynx series 19, image 63 not significantly changed from last year. No superimposed cervical lymphadenopathy identified. No other neck mass. Aortic arch: 3 vessel arch configuration. Mildly tortuous aortic arch. Mild to moderate plaque in the distal arch and proximal descending thoracic aorta. Right carotid system: Brachiocephalic soft and calcified plaque without stenosis. Negative right CCA. Calcified plaque at the medial left ICA origin and bulb but less than 50% stenosis with respect to the distal vessel. Tortuous right ICA at C1. Left carotid system: Negative left CCA. Mild left ICA calcified plaque without stenosis. Vertebral arteries: Proximal right subclavian soft and calcified plaque without stenosis. Normal right vertebral artery origin. Right vertebral artery is patent without stenosis to the skull base. Soft and calcified proximal left subclavian artery plaque without stenosis. Bulky similar plaque at the left vertebral origin resulting in severe stenosis (series 12, image 125). Despite string-sign appearing stenosis the left vertebral artery remains patent, and is fairly codominant. Left vertebral remains patent to the skull base with no additional stenosis. CTA HEAD Posterior circulation: Bilateral V4 segment calcified plaque with mild stenosis. Right V4 segment appears somewhat dominant. Normal left PICA origin and dominant appearing right AICA. Patent vertebrobasilar junction and basilar artery without stenosis. SCA and PCA origins are normal. Posterior communicating arteries are diminutive or absent. Bilateral PCA branches are within normal limits. Anterior circulation: Both ICA siphons are patent with calcified plaque. Mild supraclinoid siphon stenosis on the right side. Patent carotid termini, MCA and ACA origins. Normal anterior communicating artery and bilateral ACA branches. Left MCA  M1 segment and bifurcation are patent without stenosis. Right MCA M1 segment and bifurcation are patent without stenosis. Bilateral MCA branches are within normal limits. Venous sinuses: Patent. Anatomic variants: Mildly dominant right vertebral artery. Review of the MIP images confirms the above findings Critical Value/emergent results were called by telephone at the time of interpretation on 11/21/2021 at 7:48 am to Dr. SDonnetta Simpersand ED Dr. LVanita Panda who verbally acknowledged these results. IMPRESSION: 1. Positive for Acute PE with Saddle Embolus and bilateral lobar thrombus visible in the upper chest. Critical Value/emergent results were called by telephone at the time of interpretation on 11/21/2021 at 7:48 am to Dr. SDonnetta Simpersand ED Dr. LVanita Panda who verbally acknowledged these results. 2. Negative for ELVO. Very Severe atherosclerotic stenosis at the Left Vertebral Artery origin, but the vessel remains patent. Generally mild for age atherosclerosis elsewhere in the head and neck. Aortic Atherosclerosis (ICD10-I70.0). 3. Chronic Left nasopharynx polyp, not significantly changed from last year. Recommend follow-up with ENT for direct visualization. Electronically Signed   By: HGenevie AnnM.D.   On: 11/21/2021 07:57   CT HEAD CODE STROKE WO CONTRAST  Addendum Date: 11/21/2021   ADDENDUM REPORT: 11/21/2021 07:44 ADDENDUM: Study discussed by telephone with Dr. SDonnetta Simperson 11/21/2021 at 0739 hours. Electronically Signed   By: HHerminio HeadsD.  On: 11/21/2021 07:44   Result Date: 11/21/2021 CLINICAL DATA:  Code stroke.  76 year old female EXAM: CT HEAD WITHOUT CONTRAST TECHNIQUE: Contiguous axial images were obtained from the base of the skull through the vertex without intravenous contrast. RADIATION DOSE REDUCTION: This exam was performed according to the departmental dose-optimization program which includes automated exposure control, adjustment of the mA and/or kV according to patient size  and/or use of iterative reconstruction technique. COMPARISON:  Head CT 12/20/2020. FINDINGS: Brain: Stable cerebral volume. No midline shift, ventriculomegaly, mass effect, evidence of mass lesion, intracranial hemorrhage or evidence of cortically based acute infarction. Small area of chronic encephalomalacia in the left occipital pole is stable from last year. Elsewhere patchy bilateral white matter hypodensity appears fairly stable and symmetric. Deep gray nuclei, brainstem and cerebellum appear negative. Vascular: Calcified atherosclerosis at the skull base. No suspicious intracranial vascular hyperdensity. Skull: Clivus appears to remain intact. No acute osseous abnormality identified. Sinuses/Orbits: Visualized paranasal sinuses and mastoids are stable and well aerated. Other: No acute orbit or scalp soft tissue finding. Lobulated chronic polypoid lesion in the posterior left nasal cavity appears stable from last year on series 4, image 6. ASPECTS [facility] Stroke Program Early CT Score) Total score (0-10 with 10 being normal): Ten IMPRESSION: 1. No acute cortically based infarct or acute intracranial hemorrhage identified. ASPECTS 10. 2. Small chronic Left PCA territory infarct. Mild for age bilateral white matter changes. 3. Chronic left nasal cavity or nasopharynx polypoid soft tissue lesion, not significantly changed from last year favoring benign etiology. Recommend follow-up with ENT for direct visualization. Electronically Signed: By: Genevie Ann M.D. On: 11/21/2021 07:31    Labs:  CBC: Recent Labs    08/26/21 1600 11/21/21 0721 11/21/21 0750 11/21/21 1930 11/22/21 0450  WBC 6.9  --  8.6 6.8 6.4  HGB 14.1 15.3* 14.7 12.5 11.6*  HCT 43.1 45.0 47.7* 37.9 35.8*  PLT 208  --  139* 130* 125*    COAGS: Recent Labs    11/21/21 0835 11/21/21 1502 11/21/21 1925  INR 1.4* 1.3*  --   APTT 32 >200* 130*    BMP: Recent Labs    08/26/21 1600 11/21/21 0721 11/21/21 0750 11/22/21 0450  NA  139 137 136 137  K 4.5 4.5 4.3 4.5  CL 105 106 106 112*  CO2 28  --  18* 18*  GLUCOSE 105* 159* 157* 132*  BUN 22 30* 25* 21  CALCIUM 10.3  --  10.0 8.9  CREATININE 1.67* 1.50* 1.49* 1.30*  GFRNONAA 32*  --  36* 43*    LIVER FUNCTION TESTS: Recent Labs    11/21/21 0750  BILITOT 0.7  AST 25  ALT 16  ALKPHOS 54  PROT 5.6*  ALBUMIN 3.3*    Assessment and Plan:  Massive PE with Rt heart strain Mechanical thrombectomy in IR 8/18 Doing well Plans per PCCM  Electronically Signed: Lavonia Drafts, PA-C 11/22/2021, 1:45 PM   I spent a total of 15 Minutes at the the patient's bedside AND on the patient's hospital floor or unit, greater than 50% of which was counseling/coordinating care for PE/Thrpmbectomy

## 2021-11-22 NOTE — Progress Notes (Signed)
NAME:  GRIFFIN DEWILDE, MRN:  [id number], DOB:  1945/06/19, LOS: 1 ADMISSION DATE:  11/21/2021, CONSULTATION DATE: 11/21/2021 REFERRING MD: Emergency department physician, CHIEF COMPLAINT: New diagnosis of PE  History of Present Illness:  76 year old female with a plethora of health issues that are well documented below.  She is originally admitted for code stroke due to her history of atrial fibrillation and unable to use anticoagulation secondary to falls.  CT did not reveal neurological involvement and she returned to her normal neurological baseline.  ET however did reveal bilateral saddle PE pulmonary critical care asked to evaluate for the possibility of heparin versus lytics.  Her multiple medical comorbidities precluded lytics therefore interventional radiology will be contacted for possible thrombectomy.  Of note she developed hypotension most likely multifactorial from poor p.o. intake questionable component of PE and she was treated with IV fluids initiation of initial peripheral vasopressors and she will be admitted to the intensive care unit.  Pertinent  Medical History   Past Medical History:  Diagnosis Date   CAD (coronary artery disease) 11/04/2016   Cancer ([place])    skin cancer   Cerebrovascular disease    Chronic atrial fibrillation ([place]) 09/28/2018   Chronic bilateral low back pain without sciatica 03/08/2018   Chronic kidney disease    Chronic obstructive lung disease ([place]) 09/28/2018   Chronic pain syndrome 03/08/2018   Chronic renal failure 09/28/2018   Chronic, continuous use of opioids 03/04/2021   Last Assessment & Plan:  Formatting of this note might be different from the original. Patient and I have discussed the hazardous effects of continued opiate pain medication usage. Risks and benefits of above medications including but not limited to possibility of respiratory depression, sedation, and even death were discussed with the patient who expressed an understanding.  Patient  did not displ   CKD (chronic kidney disease) stage 3, GFR 30-59 ml/min (HCC)    Comprehensive diabetic foot examination, type 2 DM, encounter for ([place]) 04/29/2021   DDD (degenerative disc disease), lumbar 11/17/2018   Last Assessment & Plan:  Formatting of this note might be different from the original. See spinal stenosis plan   Diabetes ([place])    Diabetes mellitus due to underlying condition with unspecified complications ([place]) 0/12/9831   Dyshidrotic eczema 04/29/2021   Dysrhythmia    afib   Essential hypertension 11/04/2016   Facet arthritis of lumbar region 03/08/2018   Facet joint disease 11/17/2018   Last Assessment & Plan:  Formatting of this note might be different from the original. See spinal stenosis plan   Foraminal stenosis of lumbar region 03/08/2018   Heart murmur    History of knee replacement, total, bilateral 11/17/2018   Last Assessment & Plan:  Formatting of this note might be different from the original. Have recommended to her several times if any further concerns in regards to the pain status post arthroplasty, she is to follow up with her surgeon.   Hypertension    Hypertensive disorder 09/28/2018   Increased body mass index 09/28/2018   Lumbar radiculopathy 03/08/2018   Lumbar spondylosis 03/20/2019   Mixed dyslipidemia 11/04/2016   Morbid obesity ([place]) 11/04/2016   Muscle pain 04/29/2020   Osteoarthritis of left knee    PAF (paroxysmal atrial fibrillation) ([place]) 04/30/2015   Pain management contract agreement 04/13/2019   Last Assessment & Plan:  Formatting of this note might be different from the original. Contract updated today.  UDS completed.  [place] controlled substance registry reviewed and  is consistent with her regimen.   Peripheral vascular disease ([place]) 09/28/2018   Preop cardiovascular exam 05/01/2019   Renal insufficiency 04/30/2015   S/P insertion of spinal cord stimulator 07/13/2019   Last Assessment & Plan:  Formatting of this note might be different from the  original. About 5 months status post insertion of spinal cord stimulator with excellent relief.  She continues to keep close follow-up with her representative and has been working to appropriately find best programs to get her adequate pain relief.   Sleep apnea    Smoker 09/28/2018   Spinal stenosis of lumbar region 03/20/2019   Spinal stenosis of lumbar region with neurogenic claudication 03/08/2018   Last Assessment & Plan:  Formatting of this note is different from the original. 76 year old female with chronic low back pain and bilateral, right greater than left, L4-5 radicular pain, and claudication. She has substantial amount of multifactorial degenerative thoracic and lumbar spine pathology.  After being deemed an inappropriate open surgical candidate, and failing to respond to multiple in   Spondylolisthesis 03/20/2019   Spondylosis of lumbosacral region without myelopathy or radiculopathy 01/30/2019   Last Assessment & Plan:  Formatting of this note might be different from the original. See spinal stenosis plan   Type 2 diabetes mellitus ([place]) 09/28/2018     Significant Hospital Events: Including procedures, antibiotic start and stop dates in addition to other pertinent events   Admitted for massive PE> heparin, mechanical thrombectomy. Not a lytics candidate due to falls.  Interim History / Subjective:  Breathing feels improved today, overall she feels better.  She denies new complaints.  Objective   Blood pressure 113/73, pulse 99, temperature 97.8 F (36.6 C), temperature source Oral, resp. rate (!) 23, height '4\' 8"'$  (1.422 m), weight 73.8 kg, SpO2 100 %.        Intake/Output Summary (Last 24 hours) at 11/22/2021 1102 Last data filed at 11/22/2021 0800 Gross per 24 hour  Intake 2632.9 ml  Output 900 ml  Net 1732.9 ml    Filed Weights   11/21/21 0700 11/21/21 0750 11/22/21 0500  Weight: 71.4 kg 71.4 kg 73.8 kg    Examination: General: Chronically ill-appearing, frail  elderly woman sitting up in bed no acute distress HENT: [place]/AT, eyes anicteric Lungs: Breathing comfortably on nasal cannula, CTA B Cardiovascular: S1-S2, regular rate and rhythm Abdomen: Soft, nontender, nondistended Extremities: No peripheral edema, no cyanosis Neuro: Awake, alert, normal speech, answering questions appropriately.  Globally weak. Derm: Bruising, thin skin   Echo: LVEF 60-65%, McConnell's sign, D-shaped septum. Mild to mod TR, dilated IVC with reduced variability  BUN 21 Cr 1.3 H/H 11.6/35.8    Resolved Hospital Problem list     Assessment & Plan:  Acute encephalopathy- likely 2/2 shock from PE.  Originally thought to be a code stroke due to left facial droop with negative head CT.  She is not on anticoagulation for atrial fibrillation due to history of multiple falls.  Resolved. -Maintain adequate perfusion  Acute respiratory failure with hypoxia.  Massive PE- age, falls, and extensive medical history preclude lytics. S/p mechanical thrombectomy 8/18.  Likely provoked by immobility since her back surgery in May 2023. -heparin> transition to [place] today. -needs 3 months AC -needs age appropriate cancer screening as an outpatient - We will arrange outpatient follow-up with [facility] in 1 to 2 months.  Shock due to massive PE with acute RV failure; shock resolved - Discontinue arterial line - Maintain euvolemia; stop IV maintenance fluids -  Continue anticoagulation - Monitor on telemetry - We will need follow-up echocardiogram as an outpatient - Outpatient follow-up with [facility] has been requested.  History of coronary artery disease  chronic atrial fibrillation -Outpatient not on anticoagulation per PCP due to frequent falls - Monitor on telemetry - Start metoprolol, when blood pressure improves can switch back to PTA Coreg  Type 2 diabetes mellitus, hyperglycemia controlled -SSI as needed -goal bG 140-180  AKI on CKD 3b -Hold  spironolactone -Strict I's/O - Renally dose meds and avoid nephrotoxic meds  Chronic pain - Resume PTA gabapentin  Stable to transfer to progressive care today.  TRH to assume care tomorrow.  Best Practice (right click and "Reselect all SmartList Selections" daily)   Diet/type: Regular consistency (see orders) DVT prophylaxis: systemic heparin> DOAC GI prophylaxis: PPI Lines: Arterial Line and No longer needed.  Order written to d/c  Foley:  N/A Code Status:  full code Last date of multidisciplinary goals of care discussion [tbd] 11/21/2021 husband and daughter at bedside confirm full CODE STATUS for now. Labs   CBC: Recent Labs  Lab 11/21/21 0721 11/21/21 0750 11/21/21 1930 11/22/21 0450  WBC  --  8.6 6.8 6.4  NEUTROABS  --  6.1  --   --   HGB 15.3* 14.7 12.5 11.6*  HCT 45.0 47.7* 37.9 35.8*  MCV  --  100.0 94.0 95.7  PLT  --  139* 130* 125*     Basic Metabolic Panel: Recent Labs  Lab 11/21/21 0721 11/21/21 0750 11/22/21 0450  NA 137 136 137  K 4.5 4.3 4.5  CL 106 106 112*  CO2  --  18* 18*  GLUCOSE 159* 157* 132*  BUN 30* 25* 21  CREATININE 1.50* 1.49* 1.30*  CALCIUM  --  10.0 8.9    GFR: Estimated Creatinine Clearance: 30.3 mL/min (A) (by C-G formula based on SCr of 1.3 mg/dL (H)). Recent Labs  Lab 11/21/21 0750 11/21/21 0955 11/21/21 1502 11/21/21 1930 11/21/21 2105 11/22/21 0450  PROCALCITON  --  <0.10  --   --   --   --   WBC 8.6  --   --  6.8  --  6.4  LATICACIDVEN 1.9  --  1.0  --  0.9  --       Critical care time:  n/a     Julian Hy, DO 11/22/21 12:42 PM [facility]

## 2021-11-22 NOTE — Progress Notes (Signed)
[place] for heparin - > apixaban Indication: pulmonary embolus  Heparin Dosing Weight: 53.2 kg  Labs: Recent Labs    11/21/21 0721 11/21/21 0750 11/21/21 0835 11/21/21 1502 11/21/21 1925 11/21/21 1930 11/22/21 0450  HGB 15.3* 14.7  --   --   --  12.5 11.6*  HCT 45.0 47.7*  --   --   --  37.9 35.8*  PLT  --  139*  --   --   --  130* 125*  APTT  --   --  32 >200* 130*  --   --   LABPROT  --   --  17.1* 16.3*  --   --   --   INR  --   --  1.4* 1.3*  --   --   --   HEPARINUNFRC  --   --   --  >1.10* 0.50  --  0.28*  CREATININE 1.50* 1.49*  --   --   --   --  1.30*     Assessment: 47 yof with hx prior strokes, PAF not on anticoagulation due to hx falls, L2-3 decompression and fusion in 08/2021 for cauda equina syndrome presenting as code stroke with L facial droop, AMS. Code stroke cancelled with low suspicion per Neurology and team signed off (imaging showed small old stroke but no acute findings). CTA did show saddle PE.   Switch from Heparin to apixaban    Plan:  Apixaban 10 mg po bid x 7 days, then Apixaban 5 mg po bid Monitor for signs and symptoms of bleeding  Alanda Slim, PharmD, [facility] Clinical Pharmacist Please see AMION for all Pharmacists' Contact Phone Numbers 11/22/2021, 1:11 PM

## 2021-11-22 NOTE — Progress Notes (Signed)
[place] for heparin Indication: pulmonary embolus  Heparin Dosing Weight: 53.2 kg  Labs: Recent Labs    11/21/21 0721 11/21/21 0750 11/21/21 0835 11/21/21 1502 11/21/21 1925 11/21/21 1930 11/22/21 0450  HGB 15.3* 14.7  --   --   --  12.5 11.6*  HCT 45.0 47.7*  --   --   --  37.9 35.8*  PLT  --  139*  --   --   --  130* 125*  APTT  --   --  32 >200* 130*  --   --   LABPROT  --   --  17.1* 16.3*  --   --   --   INR  --   --  1.4* 1.3*  --   --   --   HEPARINUNFRC  --   --   --  >1.10* 0.50  --  0.28*  CREATININE 1.50* 1.49*  --   --   --   --   --      Assessment: 65 yof with hx prior strokes, PAF not on anticoagulation due to hx falls, L2-3 decompression and fusion in 08/2021 for cauda equina syndrome presenting as code stroke with L facial droop, AMS. Code stroke cancelled with low suspicion per Neurology and team signed off (imaging showed small old stroke but no acute findings). CTA did show saddle PE.   Heparin level below goal: 0.28 on 850 units/hr, heparin held yesterday evening for arm bleed, but RN reports bleeding has stopped this shift. Hgb down from admit 15.3>11.6, PLT low 125 but stable. Will continue to monitor closely   Goal of Therapy:  Heparin level 0.3-0.7 units/ml  Monitor platelets by anticoagulation protocol: Yes   Plan:  Increase IV Heparin to 950 units/hr  Recheck Heparin level and CBC in 8 hours  Georga Bora, PharmD Clinical Pharmacist 11/22/2021 5:34 AM Please check AMION for all [place] numbers

## 2021-11-22 NOTE — Telephone Encounter (Signed)
Follow-up with [facility] requested in 1 to 2 months.  Julian Hy, DO 11/22/21 12:43 PM [facility]

## 2021-11-23 ENCOUNTER — Inpatient Hospital Stay (HOSPITAL_COMMUNITY): Payer: Medicare HMO

## 2021-11-23 DIAGNOSIS — N179 Acute kidney failure, unspecified: Secondary | ICD-10-CM

## 2021-11-23 DIAGNOSIS — M7989 Other specified soft tissue disorders: Secondary | ICD-10-CM | POA: Diagnosis not present

## 2021-11-23 DIAGNOSIS — I2602 Saddle embolus of pulmonary artery with acute cor pulmonale: Secondary | ICD-10-CM | POA: Diagnosis not present

## 2021-11-23 DIAGNOSIS — G9341 Metabolic encephalopathy: Secondary | ICD-10-CM | POA: Diagnosis not present

## 2021-11-23 DIAGNOSIS — I10 Essential (primary) hypertension: Secondary | ICD-10-CM

## 2021-11-23 DIAGNOSIS — I2699 Other pulmonary embolism without acute cor pulmonale: Secondary | ICD-10-CM | POA: Diagnosis not present

## 2021-11-23 LAB — GLUCOSE, CAPILLARY
Glucose-Capillary: 108 mg/dL — ABNORMAL HIGH (ref 70–99)
Glucose-Capillary: 92 mg/dL (ref 70–99)
Glucose-Capillary: 124 mg/dL — ABNORMAL HIGH (ref 70–99)
Glucose-Capillary: 133 mg/dL — ABNORMAL HIGH (ref 70–99)

## 2021-11-23 MED ORDER — FERROUS SULFATE 325 (65 FE) MG PO TABS
325.0000 mg | ORAL_TABLET | Freq: Every day | ORAL | Status: DC
  Administered 2021-11-24: 325 mg via ORAL
  Filled 2021-11-23 (×2): qty 1

## 2021-11-23 MED ORDER — METHOCARBAMOL 500 MG PO TABS
500.0000 mg | ORAL_TABLET | Freq: Four times a day (QID) | ORAL | Status: DC | PRN
  Administered 2021-11-24: 500 mg via ORAL
  Filled 2021-11-23: qty 1

## 2021-11-23 MED ORDER — ACETAMINOPHEN 325 MG PO TABS: 650.0000 mg | ORAL_TABLET | Freq: Four times a day (QID) | ORAL | Status: DC | PRN

## 2021-11-23 MED ORDER — IPRATROPIUM-ALBUTEROL 0.5-2.5 (3) MG/3ML IN SOLN
3.0000 mL | Freq: Four times a day (QID) | RESPIRATORY_TRACT | Status: DC | PRN
Start: 2021-11-23 — End: 2021-11-24

## 2021-11-23 MED ORDER — VITAMIN B-12 100 MCG PO TABS
500.0000 ug | ORAL_TABLET | Freq: Every morning | ORAL | Status: DC
  Administered 2021-11-23 – 2021-11-24 (×2): 500 ug via ORAL
  Filled 2021-11-23 (×3): qty 5

## 2021-11-23 MED ORDER — ONDANSETRON HCL 4 MG/2ML IJ SOLN: 4.0000 mg | Freq: Four times a day (QID) | INTRAMUSCULAR | Status: DC | PRN

## 2021-11-23 MED ORDER — FLUTICASONE FUROATE-VILANTEROL 100-25 MCG/ACT IN AEPB
1.0000 | INHALATION_SPRAY | Freq: Every day | RESPIRATORY_TRACT | Status: DC
  Administered 2021-11-24: 1 via RESPIRATORY_TRACT
  Filled 2021-11-23: qty 28

## 2021-11-23 MED ORDER — HYDROCODONE-ACETAMINOPHEN 10-325 MG PO TABS
0.5000 | ORAL_TABLET | Freq: Four times a day (QID) | ORAL | Status: DC | PRN
  Administered 2021-11-24: 0.5 via ORAL
  Filled 2021-11-23: qty 1

## 2021-11-23 NOTE — Evaluation (Signed)
Physical Therapy Evaluation Patient Details Name: Lisa Maxwell MRN: [id number] DOB: 09/14/1945 Today's Date: 11/23/2021  History of Present Illness  76 year old female admitted 11/21/21 for code stroke due to her history of atrial fibrillation and unable to use anticoagulation secondary to falls. She had fallen at home. CT head negative but did show bil saddle PE. 8/18 underwent pulmonary arteriography and mechanical thrombectomy;  PMH CAD, cerebrovascular disease, a-fib, COPD, chronic pain syndrome, renal failure, DDD, DM, HTN, bil TKA, PVD, spinal stenosis lumbar spine with neurogenic claudication  Clinical Impression   Pt admitted secondary to problem above with deficits below. PTA patient was using rollator to walk modified independently in home and wheelchair when going out in community.  Pt currently requires minguard assist for OOB to chair and will need +2 for lines and safety to ambulate farther distance. Anticipate patient will benefit from PT to address problems listed below.Will continue to follow acutely to maximize functional mobility independence and safety.          Recommendations for follow up therapy are one component of a multi-disciplinary discharge planning process, led by the attending physician.  Recommendations may be updated based on patient status, additional functional criteria and insurance authorization.  Follow Up Recommendations Outpatient PT (resume as PTA)      Assistance Recommended at Discharge PRN  Patient can return home with the following  Assistance with cooking/housework;Assist for transportation;Help with stairs or ramp for entrance (as PTA)    Equipment Recommendations None recommended by PT  Recommendations for Other Services  OT consult    Functional Status Assessment Patient has had a recent decline in their functional status and demonstrates the ability to make significant improvements in function in a reasonable and predictable amount of  time.     Precautions / Restrictions Precautions Precautions: Fall Precaution Comments: h/o falls Required Braces or Orthoses:  (is in process of getting AFO for RLE)      Mobility  Bed Mobility Overal bed mobility: Needs Assistance Bed Mobility: Rolling, Sidelying to Sit Rolling: Independent Sidelying to sit: Min guard       General bed mobility comments: minguard due to height of ICU bed and pt of short stature    Transfers Overall transfer level: Needs assistance Equipment used: Rolling walker (2 wheels) Transfers: Bed to chair/wheelchair/BSC, Sit to/from Stand Sit to Stand: Min guard, From elevated surface   Step pivot transfers: Min guard       General transfer comment: able to manage RW herself but reports "it doesn't turn well like mine"; guarding assist for safety    Ambulation/Gait               General Gait Details: pivotal steps to chair only; will need +2 safety/lines for ambulation  Stairs            Wheelchair Mobility    Modified Rankin (Stroke Patients Only)       Balance Overall balance assessment: Mild deficits observed, not formally tested                                           Pertinent Vitals/Pain Pain Assessment Pain Assessment: Faces Faces Pain Scale: Hurts little more Pain Location: back Pain Descriptors / Indicators: Aching Pain Intervention(s): Limited activity within patient's tolerance, Monitored during session, Repositioned    Home Living Family/patient expects to be discharged to:: Private residence Living  Arrangements: Spouse/significant other Available Help at Discharge: Family;Available 24 hours/day Type of Home: House Home Access: Ramped entrance     Alternate Level Stairs-Number of Steps: 4 steps Home Layout: Two level;Bed/bath upstairs (she stays on main level and sleeps on couch) Home Equipment: Rolling Walker (2 wheels);Rollator (4 wheels);BSC/3in1;Shower seat - built in;Grab  bars - tub/shower;Wheelchair - manual Additional Comments: Goes to niece's house for shower once a week    Prior Function Prior Level of Function : Needs assist             Mobility Comments: uses rollator inside and w/c for leaving home; going to OPPT ADLs Comments: Reports she does her ADLs. Husband does IADLs     Hand Dominance   Dominant Hand: Right    Extremity/Trunk Assessment   Upper Extremity Assessment Upper Extremity Assessment: Defer to OT evaluation    Lower Extremity Assessment Lower Extremity Assessment: Generalized weakness;RLE deficits/detail RLE Deficits / Details: Rt foot drop    Cervical / Trunk Assessment Cervical / Trunk Assessment: Other exceptions Cervical / Trunk Exceptions: overweight  Communication   Communication: No difficulties  Cognition Arousal/Alertness: Awake/alert Behavior During Therapy: WFL for tasks assessed/performed Overall Cognitive Status: Within Functional Limits for tasks assessed                                          General Comments General comments (skin integrity, edema, etc.): on 3L with sats 96% throughout session; HR 110s    Exercises     Assessment/Plan    PT Assessment Patient needs continued PT services  PT Problem List Decreased strength;Decreased activity tolerance;Decreased balance;Decreased mobility;Decreased knowledge of use of DME;Cardiopulmonary status limiting activity;Obesity;Pain       PT Treatment Interventions DME instruction;Gait training;Functional mobility training;Therapeutic activities;Therapeutic exercise;Balance training;Patient/family education    PT Goals (Current goals can be found in the Care Plan section)  Acute Rehab PT Goals Patient Stated Goal: get back home and return to OPPT PT Goal Formulation: With patient Time For Goal Achievement: 12/07/21 Potential to Achieve Goals: Good    Frequency Min 3X/week     Co-evaluation               AM-PAC PT  "6 Clicks" Mobility  Outcome Measure Help needed turning from your back to your side while in a flat bed without using bedrails?: None Help needed moving from lying on your back to sitting on the side of a flat bed without using bedrails?: A Little Help needed moving to and from a bed to a chair (including a wheelchair)?: A Little Help needed standing up from a chair using your arms (e.g., wheelchair or bedside chair)?: A Little Help needed to walk in hospital room?: Total Help needed climbing 3-5 steps with a railing? : Total 6 Click Score: 15    End of Session Equipment Utilized During Treatment: Gait belt;Oxygen Activity Tolerance: Patient tolerated treatment well Patient left: in chair;with call bell/phone within reach;with chair alarm set Nurse Communication: Mobility status;Other (comment) (chair alarm plug in wall is broken; alarm will only sound in room) PT Visit Diagnosis: Unsteadiness on feet (R26.81);Muscle weakness (generalized) (M62.81);History of falling (Z91.81)    Time: 0093-8182 PT Time Calculation (min) (ACUTE ONLY): 26 min   Charges:   PT Evaluation $PT Eval Moderate Complexity: 1 Mod PT Treatments $Therapeutic Activity: 8-22 mins         Arby Barrette,  PT Acute Rehabilitation Services  Office [phone]   Rexanne Mano 11/23/2021, 9:57 AM

## 2021-11-23 NOTE — Progress Notes (Signed)
PROGRESS NOTE        PATIENT DETAILS Name: Lisa Maxwell Age: 76 y.o. Sex: female Date of Birth: 04-06-1946 Admit Date: 11/21/2021 Admitting Physician Freddi Starr, MD PYK:DXIPJAS, Lora Havens, MD  Brief Summary: Patient is a 76 y.o.  female with history of chronic atrial fibrillation, HTN, HLD, frequent falls, chronic venous syndrome-s/p L2-L3 decompression May 2023, chronic back pain-s/p spinal cord stimulator in place, COPD-who presented with altered mental status-initially there was some concern whether she had acute CVA-but upon further evaluation she was found to have cardiogenic shock-due to massive saddle embolus.  She was evaluated by IR-underwent mechanical thrombectomy-monitor in the ICU closely-started on anticoagulation and transferred to [facility] service on 8/20.  Significant events: 8/18>> admit to ICU-AMS-shock-saddle PE.  IR consulted for mechanical thrombectomy. 8/20>> transfer to [facility]  Significant studies: 8/18>> CT head: No acute abnormality 8/18>> CT angio head/neck: Negative for LVO, severe atherosclerotic stenosis at the left vertebral artery origin-but vessel remains patent. 8/18>> CT angio chest: Saddle pulmonary embolus 8/18>> Echo: EF 60-65%, no regional wall motion abnormality, RV systolic function moderately reduced-RVSP 64.6 mmHg.  Significant microbiology data: 8/18>> blood culture: No growth 8/18>> COVID/flu PCR: Negative  Procedures: 8/18>> mechanical thrombectomy  Consults: PCCM IR Neurology  Subjective: Lying comfortably in bed-denies any chest pain or shortness of breath.  Objective: Vitals: Blood pressure 97/62, pulse 65, temperature (!) 97.4 F (36.3 C), temperature source Axillary, resp. rate (!) 23, height $RemoveBe'4\' 8"'dkWVlPJAw$  (1.422 m), weight 75.6 kg, SpO2 95 %.   Exam: Gen Exam:Alert awake-not in any distress HEENT:atraumatic, normocephalic Chest: B/L clear to auscultation anteriorly CVS:S1S2 regular Abdomen:soft non  tender, non distended Extremities:no edema Neurology: Non focal Skin: no rash  Pertinent Labs/Radiology:    Latest Ref Rng & Units 11/22/2021    4:32 PM 11/22/2021    4:50 AM 11/21/2021    7:30 PM  CBC  WBC 4.0 - 10.5 K/uL 6.9  6.4  6.8   Hemoglobin 12.0 - 15.0 g/dL 11.5  11.6  12.5   Hematocrit 36.0 - 46.0 % 34.9  35.8  37.9   Platelets 150 - 400 K/uL 133  125  130     Lab Results  Component Value Date   NA 137 11/22/2021   K 4.5 11/22/2021   CL 112 (H) 11/22/2021   CO2 18 (L) 11/22/2021      Assessment/Plan: Acute metabolic encephalopathy: Due to shock/saddle PE-resolved.  Not felt to have CVA-CVA ruled out.  Cardiogenic shock due to RV failure in the setting of saddle embolus: Shock physiology has resolved-BP stable-s/p mechanical thrombectomy.  Saddle pulmonary embolism with RV failure: S/p mechanical thrombectomy-now on Eliquis.  Suspicion for provoked PE in the setting of chronic debility/immobility/sedentary lifestyle.  Check lower extremity Dopplers  Acute hypoxic respiratory failure due to pulmonary embolism: On minimal amount of oxygen this morning-we will attempt to titrate to room air over the next few days.  Elevated troponin: Due to PE/demand ischemia-no clinical suspicion for MI  Chronic atrial fibrillation: Rate controlled with beta-blocker-previously not considered high candidate for long-term anticoagulation-however now on Eliquis given saddle pulmonary embolus.  CAD: No anginal symptoms  HTN: BP currently stable on low-dose metoprolol-Aldactone on hold.  DM-2 (A1c 6.5 on 5/23): Continue SSI and follow.  Recent Labs    11/22/21 2344 11/23/21 0338 11/23/21 0810  GLUCAP 128* 108* 92  HLD: Continue statin  Anemia: Mild-due to acute illness-no evidence of bleeding-follow.  Thrombocytopenia: Mild-likely due to consumption from large PE.  Follow.  AKI on CKD stage IIIb: AKI hemodynamically mediated-improving-supportive care.  COPD: Not in  exacerbation-continue bronchodilators.  Chronic back pain-s/p spinal cord stimulator in place-recent history of cauda equina syndrome-s/p L2-L3 decompression: Pain appears stable-continue Neurontin, as needed narcotics.  Patient is minimally ambulatory-mostly sits in a wheelchair/couch-but able to take a few steps.  History of frequent falls.  Await PT/OT eval.  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  Pressure Ulcer: Pressure Injury 11/21/21 Buttocks Left;Medial Stage 2 -  Partial thickness loss of dermis presenting as a shallow open injury with a red, pink wound bed without slough. (Active)  11/21/21 1112  Location: Buttocks  Location Orientation: Left;Medial  Staging: Stage 2 -  Partial thickness loss of dermis presenting as a shallow open injury with a red, pink wound bed without slough.  Wound Description (Comments):   Present on Admission: Yes  Dressing Type Foam - Lift dressing to assess site every shift 11/22/21 2000    Morbid Obesity: Estimated body mass index is 37.37 kg/m as calculated from the following:   Height as of this encounter: 4\' 8"  (1.422 m).   Weight as of this encounter: 75.6 kg.   Code status:   Code Status: Full Code   DVT Prophylaxis: SCDs Start: 11/21/21 0923 apixaban (ELIQUIS) tablet 10 mg  apixaban (ELIQUIS) tablet 5 mg     Family Communication: None at bedside   Disposition Plan: Status is: Inpatient Remains inpatient appropriate because: Saddle PE-on anticoagulation-deconditioning-resolving AKI-needs a few more days of optimization-needs PT eval to determine safe disposition.   Planned Discharge Destination: SNF versus home health   Diet: Diet Order             Diet heart healthy/carb modified Room service appropriate? Yes; Fluid consistency: Thin  Diet effective now                      Antimicrobial agents: Anti-infectives (From admission, onward)    None        MEDICATIONS: Scheduled Meds:  apixaban  10 mg Oral BID   Followed by   11/23/21 ON 11/29/2021] apixaban  5 mg Oral BID   Chlorhexidine Gluconate Cloth  6 each Topical Daily   fluticasone furoate-vilanterol  1 puff Inhalation Daily   gabapentin  600 mg Oral TID   metoprolol tartrate  12.5 mg Oral BID   pantoprazole  40 mg Oral Daily   pravastatin  10 mg Oral q1800   Continuous Infusions:  sodium chloride Stopped (11/21/21 1036)   PRN Meds:.docusate sodium, polyethylene glycol   I have personally reviewed following labs and imaging studies  LABORATORY DATA: CBC: Recent Labs  Lab 11/21/21 0721 11/21/21 0750 11/21/21 1930 11/22/21 0450 11/22/21 1632  WBC  --  8.6 6.8 6.4 6.9  NEUTROABS  --  6.1  --   --   --   HGB 15.3* 14.7 12.5 11.6* 11.5*  HCT 45.0 47.7* 37.9 35.8* 34.9*  MCV  --  100.0 94.0 95.7 94.3  PLT  --  139* 130* 125* 133*    Basic Metabolic Panel: Recent Labs  Lab 11/21/21 0721 11/21/21 0750 11/22/21 0450  NA 137 136 137  K 4.5 4.3 4.5  CL 106 106 112*  CO2  --  18* 18*  GLUCOSE 159* 157* 132*  BUN 30* 25* 21  CREATININE 1.50* 1.49* 1.30*  CALCIUM  --  10.0 8.9    GFR: Estimated Creatinine Clearance: 30.7 mL/min (A) (by C-G formula based on SCr of 1.3 mg/dL (H)).  Liver Function Tests: Recent Labs  Lab 11/21/21 0750  AST 25  ALT 16  ALKPHOS 54  BILITOT 0.7  PROT 5.6*  ALBUMIN 3.3*   Recent Labs  Lab 11/21/21 0955  LIPASE 46  AMYLASE 57   No results for input(s): "AMMONIA" in the last 168 hours.  Coagulation Profile: Recent Labs  Lab 11/21/21 0835 11/21/21 1502  INR 1.4* 1.3*    Cardiac Enzymes: No results for input(s): "CKTOTAL", "CKMB", "CKMBINDEX", "TROPONINI" in the last 168 hours.  BNP (last 3 results) No results for input(s): "PROBNP" in the last 8760 hours.  Lipid Profile: No results for input(s): "CHOL", "HDL",  "LDLCALC", "TRIG", "CHOLHDL", "LDLDIRECT" in the last 72 hours.  Thyroid Function Tests: No results for input(s): "TSH", "T4TOTAL", "FREET4", "T3FREE", "THYROIDAB" in the last 72 hours.  Anemia Panel: No results for input(s): "VITAMINB12", "FOLATE", "FERRITIN", "TIBC", "IRON", "RETICCTPCT" in the last 72 hours.  Urine analysis:    Component Value Date/Time   COLORURINE YELLOW 12/13/2008 0618   APPEARANCEUR CLOUDY (A) 12/13/2008 0618   LABSPEC 1.014 12/13/2008 0618   PHURINE 6.0 12/13/2008 0618   GLUCOSEU NEGATIVE 12/13/2008 0618   HGBUR NEGATIVE 12/13/2008 0618   BILIRUBINUR NEGATIVE 12/13/2008 0618   KETONESUR NEGATIVE 12/13/2008 0618   PROTEINUR NEGATIVE 12/13/2008 0618   UROBILINOGEN 0.2 12/13/2008 0618   NITRITE POSITIVE (A) 12/13/2008 0618   LEUKOCYTESUR SMALL (A) 12/13/2008 0618    Sepsis Labs: Lactic Acid, Venous    Component Value Date/Time   LATICACIDVEN 0.9 11/21/2021 2105    MICROBIOLOGY: Recent Results (from the past 240 hour(s))  Resp Panel by RT-PCR (Flu A&B, Covid) Anterior Nasal Swab     Status: None   Collection Time: 11/21/21  7:22 AM   Specimen: Anterior Nasal Swab  Result Value Ref Range Status   SARS Coronavirus 2 by RT PCR NEGATIVE NEGATIVE Final    Comment: (NOTE) SARS-CoV-2 target nucleic acids are NOT DETECTED.  The SARS-CoV-2 RNA is generally detectable in upper respiratory specimens during the acute phase of infection. The lowest concentration of SARS-CoV-2 viral copies this assay can detect is 138 copies/mL. A negative result does not preclude SARS-Cov-2 infection and should not be used as the sole basis for treatment or other patient management decisions. A negative result may occur with  improper specimen collection/handling, submission of specimen other than nasopharyngeal swab, presence of viral mutation(s) within the areas targeted by this assay, and inadequate number of viral copies(<138 copies/mL). A negative result must be  combined with clinical observations, patient history, and epidemiological information. The expected result is Negative.  Fact Sheet for Patients:  EntrepreneurPulse.com.au  Fact Sheet for Healthcare Providers:  IncredibleEmployment.be  This test is no t yet approved or cleared by the [place] FDA and  has been authorized for detection and/or diagnosis of SARS-CoV-2 by FDA under an Emergency Use Authorization (EUA). This EUA will remain  in effect (meaning this test can be used) for the duration of the COVID-19 declaration under Section 564(b)(1) of the Act, 21 U.S.C.section 360bbb-3(b)(1), unless the authorization is terminated  or revoked sooner.       Influenza A by PCR NEGATIVE NEGATIVE Final   Influenza B by PCR NEGATIVE NEGATIVE Final    Comment: (NOTE)  The Xpert Xpress SARS-CoV-2/FLU/RSV plus assay is intended as an aid in the diagnosis of influenza from Nasopharyngeal swab specimens and should not be used as a sole basis for treatment. Nasal washings and aspirates are unacceptable for Xpert Xpress SARS-CoV-2/FLU/RSV testing.  Fact Sheet for Patients: EntrepreneurPulse.com.au  Fact Sheet for Healthcare Providers: IncredibleEmployment.be  This test is not yet approved or cleared by the [place] FDA and has been authorized for detection and/or diagnosis of SARS-CoV-2 by FDA under an Emergency Use Authorization (EUA). This EUA will remain in effect (meaning this test can be used) for the duration of the COVID-19 declaration under Section 564(b)(1) of the Act, 21 U.S.C. section 360bbb-3(b)(1), unless the authorization is terminated or revoked.  Performed at [facility], [place] [address] [place], [place] [address]   Culture, blood (Routine x 2)     Status: None (Preliminary result)   Collection Time: 11/21/21  7:52 AM   Specimen: BLOOD RIGHT HAND  Result Value Ref Range Status    Specimen Description BLOOD RIGHT HAND  Final   Special Requests   Final    BOTTLES DRAWN AEROBIC ONLY Blood Culture results may not be optimal due to an inadequate volume of blood received in culture bottles   Culture   Final    NO GROWTH 2 DAYS Performed at [facility], [place] [address], [place], [place] [address]    Report Status PENDING  Incomplete  Culture, blood (Routine X 2) w Reflex to ID Panel     Status: None (Preliminary result)   Collection Time: 11/21/21  3:01 PM   Specimen: BLOOD  Result Value Ref Range Status   Specimen Description BLOOD BLOOD RIGHT HAND  Final   Special Requests   Final    BOTTLES DRAWN AEROBIC AND ANAEROBIC Blood Culture adequate volume   Culture   Final    NO GROWTH 2 DAYS Performed at [facility], [place] [address] [place], [place] [address]    Report Status PENDING  Incomplete  Culture, blood (Routine X 2) w Reflex to ID Panel     Status: None (Preliminary result)   Collection Time: 11/21/21  3:08 PM   Specimen: BLOOD  Result Value Ref Range Status   Specimen Description BLOOD BLOOD RIGHT FOREARM  Final   Special Requests   Final    BOTTLES DRAWN AEROBIC AND ANAEROBIC Blood Culture adequate volume   Culture   Final    NO GROWTH 2 DAYS Performed at [facility], [place] [address] [place], [place] [address]    Report Status PENDING  Incomplete    RADIOLOGY STUDIES/RESULTS: IR THROMBECT PRIM MECH INIT (INCLU) MOD SED  Result Date: 11/22/2021 INDICATION: Briefly, 75 year old female comorbid who presented with syncope and hypotension, with workup revealing massive PE. RV/LV 2.2. EXAM: Procedures; 1. ULTRASOUND GUIDANCE FOR VENOUS ACCESS 2. PULMONARY ARTERIOGRAPHY 3. FLUOROSCOPIC GUIDED CATHETER DIRECTED BILATERAL PULMONARY THROMBECTOMY Performing Physician: Michaelle Birks, MD Assistant(s): Ruthann Cancer, MD A qualified trainee/resident or advanced practice provider (APP) was not immediately available to assist with this case. COMPARISON:   CTA PE, earlier same day. MEDICATIONS: Heparin 3K IU IV ANESTHESIA/SEDATION: Sedation by the Anesthesia Team was performed. Please see anesthesiology log for details. CONTRAST:  59mL OMNIPAQUE IOHEXOL 300 MG/ML SOLN, 67mL OMNIPAQUE IOHEXOL 300 MG/ML SOLN FLUOROSCOPY TIME:  Fluoroscopic dose; 726 mGy COMPLICATIONS: None immediate. TECHNIQUE: Informed written consent was obtained from the the patient and/or patient's representative after a discussion of the risks, benefits and  alternatives to treatment. Questions regarding the procedure were encouraged and answered. A timeout was performed prior to the initiation of the procedure. Ultrasound scanning was performed of the RIGHT groin and demonstrated wide patency of the RIGHT common femoral and GSV vein. The RIGHT groin was prepped and draped in the usual sterile fashion, and a sterile drape was applied covering the operative field. Maximum barrier sterile technique with sterile gowns and gloves were used for the procedure. A timeout was performed prior to the initiation of the procedure. Local anesthesia was provided with 1% lidocaine. Under direct ultrasound guidance, the RIGHT greater saphenous vein was accessed with a micro puncture kit ultimately allowing placement of a 6 Fr vascular sheath. Ultrasound and fluoroscopic spot images were saved for procedural documentation purposes. With the use of a 0.035 inch Bentson wire and an angled pigtail pulmonary catheter, access past the RIGHT heart and into the main pulmonary artery was obtained. Pre intervention pulmonary arterial pressures were obtained from the main pulmonary artery, then a limited central pulmonary arteriogram was performed. The Bentson wire was exchanged for an Amplatz wire then a 16 Fr, 65 cm GORE dry seal sheath then a 16 Fr Penumbra Lightning aspiration catheter was advanced into the LEFT then RIGHT pulmonary arteries and catheter directed thrombectomy was performed. Intermittent and postprocedural  LEFT than RIGHT pulmonary arteriograms were performed confirming thrombus removal. Post interventional pulmonary arterial pressures were obtained from the main pulmonary artery. The catheters and sheath were then removed, and hemostasis was obtained by Perclose ProGlide and manual pressure given the large bore access. The patient tolerated the procedure well without immediate postprocedural complication. FINDINGS: Central pulmonary arteriogram demonstrates large burden bilateral pulmonary emboli with saddle morphology, and obstructing the LEFT inferior lobar branch and RIGHT pulmonary arterial bifurcation. Acquired pressure measurements: Pre: Main PA mean 40 (normal: < 25/10) Post: Main PA mean 33 (normal: < 25/10) Post intervention pulmonary arteriograms demonstrating adequate clearance from main PA and pulmonary arterial bifurcations. IMPRESSION: 1. Pulmonary arteriogram revealing large burden bilateral pulmonary emboli with saddle morphology, and obstructing the LEFT inferior lobar branch and RIGHT pulmonary arterial bifurcation. 2. Elevated pressure within the main PA compatible with critical pulmonary arterial hypertension. 3. Successful catheter-directed bilateral pulmonary thrombectomy with adequate clearance from the main PA and pulmonary arterial bifurcations. PLAN: Continue systemic anticoagulation with heparin gtt. Management per pulmonary and critical care team. Consider bilateral lower extremities Doppler US to evaluate for DVT. Procedural results were called by telephone at the time of completion to provider Freda Jackson, MD (PCCM) who verbally acknowledged these results. Michaelle Birks, MD [facility] Radiology Electronically Signed   By: Michaelle Birks M.D.   On: 11/22/2021 14:50   IR US Guide Vasc Access Right  Result Date: 11/22/2021 INDICATION: Briefly, 76 year old female comorbid who presented with syncope and hypotension, with workup revealing  massive PE. RV/LV 2.2. EXAM: Procedures; 1. ULTRASOUND GUIDANCE FOR VENOUS ACCESS 2. PULMONARY ARTERIOGRAPHY 3. FLUOROSCOPIC GUIDED CATHETER DIRECTED BILATERAL PULMONARY THROMBECTOMY Performing Physician: Michaelle Birks, MD Assistant(s): Ruthann Cancer, MD A qualified trainee/resident or advanced practice provider (APP) was not immediately available to assist with this case. COMPARISON:  CTA PE, earlier same day. MEDICATIONS: Heparin 3K IU IV ANESTHESIA/SEDATION: Sedation by the Anesthesia Team was performed. Please see anesthesiology log for details. CONTRAST:  69mL OMNIPAQUE IOHEXOL 300 MG/ML SOLN, 56mL OMNIPAQUE IOHEXOL 300 MG/ML SOLN FLUOROSCOPY TIME:  Fluoroscopic dose; 332 mGy COMPLICATIONS: None immediate. TECHNIQUE: Informed written consent was obtained from the the patient and/or  patient's representative after a discussion of the risks, benefits and alternatives to treatment. Questions regarding the procedure were encouraged and answered. A timeout was performed prior to the initiation of the procedure. Ultrasound scanning was performed of the RIGHT groin and demonstrated wide patency of the RIGHT common femoral and GSV vein. The RIGHT groin was prepped and draped in the usual sterile fashion, and a sterile drape was applied covering the operative field. Maximum barrier sterile technique with sterile gowns and gloves were used for the procedure. A timeout was performed prior to the initiation of the procedure. Local anesthesia was provided with 1% lidocaine. Under direct ultrasound guidance, the RIGHT greater saphenous vein was accessed with a micro puncture kit ultimately allowing placement of a 6 Fr vascular sheath. Ultrasound and fluoroscopic spot images were saved for procedural documentation purposes. With the use of a 0.035 inch Bentson wire and an angled pigtail pulmonary catheter, access past the RIGHT heart and into the main pulmonary artery was obtained. Pre intervention pulmonary arterial pressures  were obtained from the main pulmonary artery, then a limited central pulmonary arteriogram was performed. The Bentson wire was exchanged for an Amplatz wire then a 16 Fr, 65 cm GORE dry seal sheath then a 16 Fr Penumbra Lightning aspiration catheter was advanced into the LEFT then RIGHT pulmonary arteries and catheter directed thrombectomy was performed. Intermittent and postprocedural LEFT than RIGHT pulmonary arteriograms were performed confirming thrombus removal. Post interventional pulmonary arterial pressures were obtained from the main pulmonary artery. The catheters and sheath were then removed, and hemostasis was obtained by Perclose ProGlide and manual pressure given the large bore access. The patient tolerated the procedure well without immediate postprocedural complication. FINDINGS: Central pulmonary arteriogram demonstrates large burden bilateral pulmonary emboli with saddle morphology, and obstructing the LEFT inferior lobar branch and RIGHT pulmonary arterial bifurcation. Acquired pressure measurements: Pre: Main PA mean 40 (normal: < 25/10) Post: Main PA mean 33 (normal: < 25/10) Post intervention pulmonary arteriograms demonstrating adequate clearance from main PA and pulmonary arterial bifurcations. IMPRESSION: 1. Pulmonary arteriogram revealing large burden bilateral pulmonary emboli with saddle morphology, and obstructing the LEFT inferior lobar branch and RIGHT pulmonary arterial bifurcation. 2. Elevated pressure within the main PA compatible with critical pulmonary arterial hypertension. 3. Successful catheter-directed bilateral pulmonary thrombectomy with adequate clearance from the main PA and pulmonary arterial bifurcations. PLAN: Continue systemic anticoagulation with heparin gtt. Management per pulmonary and critical care team. Consider bilateral lower extremities Doppler US to evaluate for DVT. Procedural results were called by telephone at the time of completion to provider Freda Jackson, MD (PCCM) who verbally acknowledged these results. Michaelle Birks, MD [facility] Radiology Electronically Signed   By: Michaelle Birks M.D.   On: 11/22/2021 14:50   IR Angiogram Selective Each Additional Vessel  Result Date: 11/22/2021 INDICATION: Briefly, 76 year old female comorbid who presented with syncope and hypotension, with workup revealing massive PE. RV/LV 2.2. EXAM: Procedures; 1. ULTRASOUND GUIDANCE FOR VENOUS ACCESS 2. PULMONARY ARTERIOGRAPHY 3. FLUOROSCOPIC GUIDED CATHETER DIRECTED BILATERAL PULMONARY THROMBECTOMY Performing Physician: Michaelle Birks, MD Assistant(s): Ruthann Cancer, MD A qualified trainee/resident or advanced practice provider (APP) was not immediately available to assist with this case. COMPARISON:  CTA PE, earlier same day. MEDICATIONS: Heparin 3K IU IV ANESTHESIA/SEDATION: Sedation by the Anesthesia Team was performed. Please see anesthesiology log for details. CONTRAST:  48mL OMNIPAQUE IOHEXOL 300 MG/ML SOLN, 51mL OMNIPAQUE IOHEXOL 300 MG/ML SOLN FLUOROSCOPY TIME:  Fluoroscopic dose; 563 mGy COMPLICATIONS: None immediate. TECHNIQUE:  Informed written consent was obtained from the the patient and/or patient's representative after a discussion of the risks, benefits and alternatives to treatment. Questions regarding the procedure were encouraged and answered. A timeout was performed prior to the initiation of the procedure. Ultrasound scanning was performed of the RIGHT groin and demonstrated wide patency of the RIGHT common femoral and GSV vein. The RIGHT groin was prepped and draped in the usual sterile fashion, and a sterile drape was applied covering the operative field. Maximum barrier sterile technique with sterile gowns and gloves were used for the procedure. A timeout was performed prior to the initiation of the procedure. Local anesthesia was provided with 1% lidocaine. Under direct ultrasound guidance, the RIGHT greater  saphenous vein was accessed with a micro puncture kit ultimately allowing placement of a 6 Fr vascular sheath. Ultrasound and fluoroscopic spot images were saved for procedural documentation purposes. With the use of a 0.035 inch Bentson wire and an angled pigtail pulmonary catheter, access past the RIGHT heart and into the main pulmonary artery was obtained. Pre intervention pulmonary arterial pressures were obtained from the main pulmonary artery, then a limited central pulmonary arteriogram was performed. The Bentson wire was exchanged for an Amplatz wire then a 16 Fr, 65 cm GORE dry seal sheath then a 16 Fr Penumbra Lightning aspiration catheter was advanced into the LEFT then RIGHT pulmonary arteries and catheter directed thrombectomy was performed. Intermittent and postprocedural LEFT than RIGHT pulmonary arteriograms were performed confirming thrombus removal. Post interventional pulmonary arterial pressures were obtained from the main pulmonary artery. The catheters and sheath were then removed, and hemostasis was obtained by Perclose ProGlide and manual pressure given the large bore access. The patient tolerated the procedure well without immediate postprocedural complication. FINDINGS: Central pulmonary arteriogram demonstrates large burden bilateral pulmonary emboli with saddle morphology, and obstructing the LEFT inferior lobar branch and RIGHT pulmonary arterial bifurcation. Acquired pressure measurements: Pre: Main PA mean 40 (normal: < 25/10) Post: Main PA mean 33 (normal: < 25/10) Post intervention pulmonary arteriograms demonstrating adequate clearance from main PA and pulmonary arterial bifurcations. IMPRESSION: 1. Pulmonary arteriogram revealing large burden bilateral pulmonary emboli with saddle morphology, and obstructing the LEFT inferior lobar branch and RIGHT pulmonary arterial bifurcation. 2. Elevated pressure within the main PA compatible with critical pulmonary arterial hypertension. 3.  Successful catheter-directed bilateral pulmonary thrombectomy with adequate clearance from the main PA and pulmonary arterial bifurcations. PLAN: Continue systemic anticoagulation with heparin gtt. Management per pulmonary and critical care team. Consider bilateral lower extremities Doppler US to evaluate for DVT. Procedural results were called by telephone at the time of completion to provider Freda Jackson, MD (PCCM) who verbally acknowledged these results. Michaelle Birks, MD [facility] Radiology Electronically Signed   By: Michaelle Birks M.D.   On: 11/22/2021 14:50   IR Angiogram Pulmonary Bilateral Selective  Result Date: 11/22/2021 INDICATION: Briefly, 76 year old female comorbid who presented with syncope and hypotension, with workup revealing massive PE. RV/LV 2.2. EXAM: Procedures; 1. ULTRASOUND GUIDANCE FOR VENOUS ACCESS 2. PULMONARY ARTERIOGRAPHY 3. FLUOROSCOPIC GUIDED CATHETER DIRECTED BILATERAL PULMONARY THROMBECTOMY Performing Physician: Michaelle Birks, MD Assistant(s): Ruthann Cancer, MD A qualified trainee/resident or advanced practice provider (APP) was not immediately available to assist with this case. COMPARISON:  CTA PE, earlier same day. MEDICATIONS: Heparin 3K IU IV ANESTHESIA/SEDATION: Sedation by the Anesthesia Team was performed. Please see anesthesiology log for details. CONTRAST:  69mL OMNIPAQUE IOHEXOL 300 MG/ML SOLN, 1mL OMNIPAQUE IOHEXOL 300 MG/ML SOLN FLUOROSCOPY TIME:  Fluoroscopic dose; 858 mGy COMPLICATIONS: None immediate. TECHNIQUE: Informed written consent was obtained from the the patient and/or patient's representative after a discussion of the risks, benefits and alternatives to treatment. Questions regarding the procedure were encouraged and answered. A timeout was performed prior to the initiation of the procedure. Ultrasound scanning was performed of the RIGHT groin and demonstrated wide patency of the RIGHT common femoral and  GSV vein. The RIGHT groin was prepped and draped in the usual sterile fashion, and a sterile drape was applied covering the operative field. Maximum barrier sterile technique with sterile gowns and gloves were used for the procedure. A timeout was performed prior to the initiation of the procedure. Local anesthesia was provided with 1% lidocaine. Under direct ultrasound guidance, the RIGHT greater saphenous vein was accessed with a micro puncture kit ultimately allowing placement of a 6 Fr vascular sheath. Ultrasound and fluoroscopic spot images were saved for procedural documentation purposes. With the use of a 0.035 inch Bentson wire and an angled pigtail pulmonary catheter, access past the RIGHT heart and into the main pulmonary artery was obtained. Pre intervention pulmonary arterial pressures were obtained from the main pulmonary artery, then a limited central pulmonary arteriogram was performed. The Bentson wire was exchanged for an Amplatz wire then a 16 Fr, 65 cm GORE dry seal sheath then a 16 Fr Penumbra Lightning aspiration catheter was advanced into the LEFT then RIGHT pulmonary arteries and catheter directed thrombectomy was performed. Intermittent and postprocedural LEFT than RIGHT pulmonary arteriograms were performed confirming thrombus removal. Post interventional pulmonary arterial pressures were obtained from the main pulmonary artery. The catheters and sheath were then removed, and hemostasis was obtained by Perclose ProGlide and manual pressure given the large bore access. The patient tolerated the procedure well without immediate postprocedural complication. FINDINGS: Central pulmonary arteriogram demonstrates large burden bilateral pulmonary emboli with saddle morphology, and obstructing the LEFT inferior lobar branch and RIGHT pulmonary arterial bifurcation. Acquired pressure measurements: Pre: Main PA mean 40 (normal: < 25/10) Post: Main PA mean 33 (normal: < 25/10) Post intervention  pulmonary arteriograms demonstrating adequate clearance from main PA and pulmonary arterial bifurcations. IMPRESSION: 1. Pulmonary arteriogram revealing large burden bilateral pulmonary emboli with saddle morphology, and obstructing the LEFT inferior lobar branch and RIGHT pulmonary arterial bifurcation. 2. Elevated pressure within the main PA compatible with critical pulmonary arterial hypertension. 3. Successful catheter-directed bilateral pulmonary thrombectomy with adequate clearance from the main PA and pulmonary arterial bifurcations. PLAN: Continue systemic anticoagulation with heparin gtt. Management per pulmonary and critical care team. Consider bilateral lower extremities Doppler US to evaluate for DVT. Procedural results were called by telephone at the time of completion to provider Freda Jackson, MD (PCCM) who verbally acknowledged these results. Michaelle Birks, MD [facility] Radiology Electronically Signed   By: Michaelle Birks M.D.   On: 11/22/2021 14:50   IR Angiogram Selective Each Additional Vessel  Result Date: 11/22/2021 INDICATION: Briefly, 76 year old female comorbid who presented with syncope and hypotension, with workup revealing massive PE. RV/LV 2.2. EXAM: Procedures; 1. ULTRASOUND GUIDANCE FOR VENOUS ACCESS 2. PULMONARY ARTERIOGRAPHY 3. FLUOROSCOPIC GUIDED CATHETER DIRECTED BILATERAL PULMONARY THROMBECTOMY Performing Physician: Michaelle Birks, MD Assistant(s): Ruthann Cancer, MD A qualified trainee/resident or advanced practice provider (APP) was not immediately available to assist with this case. COMPARISON:  CTA PE, earlier same day. MEDICATIONS: Heparin 3K IU IV ANESTHESIA/SEDATION: Sedation by the Anesthesia Team was performed. Please see anesthesiology log for details. CONTRAST:  9mL OMNIPAQUE IOHEXOL 300 MG/ML  SOLN, 64mL OMNIPAQUE IOHEXOL 300 MG/ML SOLN FLUOROSCOPY TIME:  Fluoroscopic dose; 782 mGy COMPLICATIONS: None immediate. TECHNIQUE:  Informed written consent was obtained from the the patient and/or patient's representative after a discussion of the risks, benefits and alternatives to treatment. Questions regarding the procedure were encouraged and answered. A timeout was performed prior to the initiation of the procedure. Ultrasound scanning was performed of the RIGHT groin and demonstrated wide patency of the RIGHT common femoral and GSV vein. The RIGHT groin was prepped and draped in the usual sterile fashion, and a sterile drape was applied covering the operative field. Maximum barrier sterile technique with sterile gowns and gloves were used for the procedure. A timeout was performed prior to the initiation of the procedure. Local anesthesia was provided with 1% lidocaine. Under direct ultrasound guidance, the RIGHT greater saphenous vein was accessed with a micro puncture kit ultimately allowing placement of a 6 Fr vascular sheath. Ultrasound and fluoroscopic spot images were saved for procedural documentation purposes. With the use of a 0.035 inch Bentson wire and an angled pigtail pulmonary catheter, access past the RIGHT heart and into the main pulmonary artery was obtained. Pre intervention pulmonary arterial pressures were obtained from the main pulmonary artery, then a limited central pulmonary arteriogram was performed. The Bentson wire was exchanged for an Amplatz wire then a 16 Fr, 65 cm GORE dry seal sheath then a 16 Fr Penumbra Lightning aspiration catheter was advanced into the LEFT then RIGHT pulmonary arteries and catheter directed thrombectomy was performed. Intermittent and postprocedural LEFT than RIGHT pulmonary arteriograms were performed confirming thrombus removal. Post interventional pulmonary arterial pressures were obtained from the main pulmonary artery. The catheters and sheath were then removed, and hemostasis was obtained by Perclose ProGlide and manual pressure given the large bore access. The patient tolerated  the procedure well without immediate postprocedural complication. FINDINGS: Central pulmonary arteriogram demonstrates large burden bilateral pulmonary emboli with saddle morphology, and obstructing the LEFT inferior lobar branch and RIGHT pulmonary arterial bifurcation. Acquired pressure measurements: Pre: Main PA mean 40 (normal: < 25/10) Post: Main PA mean 33 (normal: < 25/10) Post intervention pulmonary arteriograms demonstrating adequate clearance from main PA and pulmonary arterial bifurcations. IMPRESSION: 1. Pulmonary arteriogram revealing large burden bilateral pulmonary emboli with saddle morphology, and obstructing the LEFT inferior lobar branch and RIGHT pulmonary arterial bifurcation. 2. Elevated pressure within the main PA compatible with critical pulmonary arterial hypertension. 3. Successful catheter-directed bilateral pulmonary thrombectomy with adequate clearance from the main PA and pulmonary arterial bifurcations. PLAN: Continue systemic anticoagulation with heparin gtt. Management per pulmonary and critical care team. Consider bilateral lower extremities Doppler US to evaluate for DVT. Procedural results were called by telephone at the time of completion to provider Freda Jackson, MD (PCCM) who verbally acknowledged these results. Michaelle Birks, MD [facility] Radiology Electronically Signed   By: Michaelle Birks M.D.   On: 11/22/2021 14:50   ECHOCARDIOGRAM COMPLETE  Result Date: 11/21/2021    ECHOCARDIOGRAM REPORT   Patient Name:   LAURAINE CRESPO [facility] Date of Exam: 11/21/2021 Medical Rec #:  [id number]      Height:       56.0 in Accession #:    [id number]     Weight:       157.4 lb Date of Birth:  February 02, 1946      BSA:          1.603 m Patient Age:    32 years  BP:           87/30 mmHg Patient Gender: F              HR:           101 bpm. Exam Location:  Inpatient Procedure: 2D Echo, Color Doppler and Cardiac Doppler Indications:    Pulmonary  embolism  History:        Patient has no prior history of Echocardiogram examinations.                 CAD; Risk Factors:Diabetes, Hypertension and Sleep Apnea.  Sonographer:    Jefferey Pica Referring Phys: [id number] [place]  1. Left ventricular ejection fraction, by estimation, is 60 to 65%. The left ventricle has normal function. The left ventricle has no regional wall motion abnormalities. There is mild left ventricular hypertrophy. Left ventricular diastolic parameters are indeterminate. There is the interventricular septum is flattened in diastole ('D' shaped left ventricle), consistent with right ventricular volume overload.  2. McConnell's Sign. Right ventricular systolic function is moderately reduced. The right ventricular size is moderately enlarged. There is severely elevated pulmonary artery systolic pressure. The estimated right ventricular systolic pressure is 98.3 mmHg.  3. The mitral valve is normal in structure. No evidence of mitral valve regurgitation.  4. Tricuspid valve regurgitation is mild to moderate.  5. There is mild calcification of the aortic valve. Aortic valve regurgitation is not visualized.  6. The inferior vena cava is dilated in size with <50% respiratory variability, suggesting right atrial pressure of 15 mmHg. Conclusion(s)/Recommendation(s): Evidence of RV strain with diagnosis of PE. FINDINGS  Left Ventricle: Left ventricular ejection fraction, by estimation, is 60 to 65%. The left ventricle has normal function. The left ventricle has no regional wall motion abnormalities. The left ventricular internal cavity size was normal in size. There is  mild left ventricular hypertrophy. The interventricular septum is flattened in diastole ('D' shaped left ventricle), consistent with right ventricular volume overload. Left ventricular diastolic parameters are indeterminate. Right Ventricle: McConnell's Sign. The right ventricular size is moderately enlarged. No  increase in right ventricular wall thickness. Right ventricular systolic function is moderately reduced. There is severely elevated pulmonary artery systolic pressure. The tricuspid regurgitant velocity is 3.52 m/s, and with an assumed right atrial pressure of 15 mmHg, the estimated right ventricular systolic pressure is 38.2 mmHg. Left Atrium: Left atrial size was normal in size. Right Atrium: Right atrial size was normal in size. Pericardium: There is no evidence of pericardial effusion. Presence of epicardial fat layer. Mitral Valve: The mitral valve is normal in structure. No evidence of mitral valve regurgitation. Tricuspid Valve: The tricuspid valve is normal in structure. Tricuspid valve regurgitation is mild to moderate. Aortic Valve: There is mild calcification of the aortic valve. Aortic valve regurgitation is not visualized. Aortic valve peak gradient measures 3.7 mmHg. Pulmonic Valve: Pulmonic valve regurgitation is not visualized. Aorta: The aortic root and ascending aorta are structurally normal, with no evidence of dilitation. Venous: The inferior vena cava is dilated in size with less than 50% respiratory variability, suggesting right atrial pressure of 15 mmHg. IAS/Shunts: No atrial level shunt detected by color flow Doppler.  LEFT VENTRICLE PLAX 2D LVIDd:         3.20 cm LVIDs:         2.60 cm LV PW:         1.30 cm LV IVS:        1.30 cm LVOT diam:  1.80 cm LV SV:         28 LV SV Index:   17 LVOT Area:     2.54 cm  RIGHT VENTRICLE            IVC RV Basal diam:  2.90 cm    IVC diam: 2.60 cm RV Mid diam:    4.10 cm RV S prime:     8.05 cm/s LEFT ATRIUM           Index        RIGHT ATRIUM           Index LA diam:      3.70 cm 2.31 cm/m   RA Area:     16.00 cm LA Vol (A2C): 47.3 ml 29.50 ml/m  RA Volume:   37.70 ml  23.51 ml/m LA Vol (A4C): 46.0 ml 28.69 ml/m  AORTIC VALVE                 PULMONIC VALVE AV Area (Vmax): 1.94 cm     PV Vmax:       0.66 m/s AV Vmax:        95.72 cm/s   PV Peak  grad:  1.7 mmHg AV Peak Grad:   3.7 mmHg LVOT Vmax:      73.10 cm/s LVOT Vmean:     41.050 cm/s LVOT VTI:       0.108 m  AORTA Ao Root diam: 3.00 cm Ao Asc diam:  3.20 cm MITRAL VALVE               TRICUSPID VALVE MV Area (PHT): 4.05 cm    TR Peak grad:   49.6 mmHg MV Decel Time: 187 msec    TR Vmax:        352.00 cm/s MV E velocity: 79.36 cm/s MV A velocity: 63.40 cm/s  SHUNTS MV E/A ratio:  1.25        Systemic VTI:  0.11 m                            Systemic Diam: 1.80 cm Phineas Inches Electronically signed by Phineas Inches Signature Date/Time: 11/21/2021/11:13:15 AM    Final    CT Angio Chest Pulmonary Embolism (PE) W or WO Contrast  Result Date: 11/21/2021 CLINICAL DATA:  Pulmonary embolism on CTA neck of earlier in the day. EXAM: CT ANGIOGRAPHY CHEST WITH CONTRAST TECHNIQUE: Multidetector CT imaging of the chest was performed using the standard protocol during bolus administration of intravenous contrast. Multiplanar CT image reconstructions and MIPs were obtained to evaluate the vascular anatomy. RADIATION DOSE REDUCTION: This exam was performed according to the departmental dose-optimization program which includes automated exposure control, adjustment of the mA and/or kV according to patient size and/or use of iterative reconstruction technique. CONTRAST:  24mL OMNIPAQUE IOHEXOL 350 MG/ML SOLN COMPARISON:  Plain film of earlier today. CTA head of 11/22/2018 lung cancer screening CT of 04/23/2021 from [facility]. FINDINGS: Cardiovascular: Saddle embolism again identified, with clot continuing into left-greater-than-right lobar pulmonary artery branches. Example 56/5 and 67/5. The RV/LV ratio is increased, on the order of 4.6/2.1. Aortic atherosclerosis. Mild cardiomegaly with trace pericardial fluid. Multivessel coronary artery atherosclerosis. Pulmonary artery enlargement, outflow tract 3.3 cm Mediastinum/Nodes: No mediastinal or hilar adenopathy. Lungs/Pleura: No pleural fluid. Mild centrilobular and  paraseptal emphysema. Upper Abdomen: Normal imaged portions of the liver, spleen stomach, adrenal glands. Possible upper pole left renal collecting system punctate calculi. Trace  perihepatic ascites Musculoskeletal: Dorsal spinal stimulator. Advanced thoracic spondylosis. Review of the MIP images confirms the above findings. IMPRESSION: 1. Redemonstration of saddle pulmonary embolus. Positive for acute PE with CT evidence of right heart strain (RV/LV Ratio = 2.2) consistent with at least submassive (intermediate risk) PE. The presence of right heart strain has been associated with an increased risk of morbidity and mortality. Please refer to the "Code PE Focused" order set in EPIC. 2. Aortic atherosclerosis (ICD10-I70.0), coronary artery atherosclerosis and emphysema (ICD10-J43.9). Electronically Signed   By: Abigail Miyamoto M.D.   On: 11/21/2021 10:35     LOS: 2 days   Oren Binet, MD  [facility]    To contact the attending provider between 7A-7P or the covering provider during after hours 7P-7A, please log into the web site [email or web address] and access using universal [place] password for that web site. If you do not have the password, please call the hospital operator.  11/23/2021, 9:45 AM

## 2021-11-23 NOTE — Progress Notes (Signed)
Bilateral lower extremity venous duplex has been completed. Preliminary results can be found in CV Proc through chart review.  Results were given to the patient's nurse, [place].  11/23/21 12:03 PM Carlos Levering RVT

## 2021-11-24 ENCOUNTER — Telehealth (HOSPITAL_COMMUNITY): Payer: Self-pay | Admitting: Pharmacy Technician

## 2021-11-24 ENCOUNTER — Other Ambulatory Visit (HOSPITAL_COMMUNITY): Payer: Self-pay

## 2021-11-24 ENCOUNTER — Encounter (HOSPITAL_COMMUNITY): Payer: Self-pay | Admitting: Radiology

## 2021-11-24 DIAGNOSIS — I2602 Saddle embolus of pulmonary artery with acute cor pulmonale: Secondary | ICD-10-CM | POA: Diagnosis not present

## 2021-11-24 DIAGNOSIS — I10 Essential (primary) hypertension: Secondary | ICD-10-CM | POA: Diagnosis not present

## 2021-11-24 DIAGNOSIS — J449 Chronic obstructive pulmonary disease, unspecified: Secondary | ICD-10-CM | POA: Diagnosis not present

## 2021-11-24 LAB — CBC
HCT: 37 % (ref 36.0–46.0)
Hemoglobin: 12.1 g/dL (ref 12.0–15.0)
MCH: 31.5 pg (ref 26.0–34.0)
MCHC: 32.7 g/dL (ref 30.0–36.0)
MCV: 96.4 fL (ref 80.0–100.0)
Platelets: 133 K/uL — ABNORMAL LOW (ref 150–400)
RBC: 3.84 MIL/uL — ABNORMAL LOW (ref 3.87–5.11)
RDW: 14.2 % (ref 11.5–15.5)
WBC: 5.7 K/uL (ref 4.0–10.5)
nRBC: 0 % (ref 0.0–0.2)

## 2021-11-24 LAB — BASIC METABOLIC PANEL
Anion gap: 5 (ref 5–15)
BUN: 18 mg/dL (ref 8–23)
CO2: 22 mmol/L (ref 22–32)
Calcium: 9.7 mg/dL (ref 8.9–10.3)
Chloride: 110 mmol/L (ref 98–111)
Creatinine, Ser: 1.56 mg/dL — ABNORMAL HIGH (ref 0.44–1.00)
GFR, Estimated: 34 mL/min — ABNORMAL LOW (ref >60)
Glucose, Bld: 160 mg/dL — ABNORMAL HIGH (ref 70–99)
Potassium: 3.9 mmol/L (ref 3.5–5.1)
Sodium: 137 mmol/L (ref 135–145)

## 2021-11-24 MED ORDER — APIXABAN 5 MG PO TABS
ORAL_TABLET | ORAL | 0 refills | Status: DC
  Filled 2021-11-24: qty 72, 30d supply, fill #0

## 2021-11-24 MED ORDER — APIXABAN 5 MG PO TABS: 5.0000 mg | ORAL_TABLET | Freq: Two times a day (BID) | ORAL | 2 refills | Status: DC

## 2021-11-24 MED ORDER — METOPROLOL TARTRATE 25 MG PO TABS
12.5000 mg | ORAL_TABLET | Freq: Two times a day (BID) | ORAL | 2 refills | Status: DC
  Filled 2021-11-24: qty 60, 60d supply, fill #0

## 2021-11-24 MED ORDER — PANTOPRAZOLE SODIUM 40 MG PO TBEC
40.0000 mg | DELAYED_RELEASE_TABLET | Freq: Every day | ORAL | 2 refills | Status: DC
  Filled 2021-11-24: qty 30, 30d supply, fill #0

## 2021-11-24 MED ORDER — NONFORMULARY OR COMPOUNDED ITEM: 0 refills | Status: DC

## 2021-11-24 NOTE — Care Management Important Message (Signed)
Important Message  Patient Details  Name: Lisa Maxwell MRN: [id number] Date of Birth: Oct 23, 1945   Medicare Important Message Given:  Yes     Shelda Altes 11/24/2021, 8:32 AM

## 2021-11-24 NOTE — Telephone Encounter (Signed)
Pharmacy Patient Advocate Encounter  Insurance verification completed.    The patient is insured through [employer] Part D   The patient is currently admitted and ran test claims for the following: Eliquis, Xarelto.  Copays and coinsurance results were relayed to Inpatient clinical team.      

## 2021-11-24 NOTE — TOC Benefit Eligibility Note (Signed)
Patient Teacher, English as a foreign language completed.    The patient is currently admitted and upon discharge could be taking Xarelto 20 mg.  The current 30 day co-pay is $145.19 due to being in Coverage Gap (donut hole).   The patient is currently admitted and upon discharge could be taking Eliquis 5 mg.  The current 30 day co-pay is $150.08 due to being in Coverage Gap (donut hole).   The patient is insured through [employer], [place] Patient Advocate Specialist [place] Patient Advocate Team Direct Number: [phone]  Fax: [phone]

## 2021-11-24 NOTE — Progress Notes (Signed)
Nsg Discharge Note  Admit Date:  11/21/2021 Discharge date: 11/24/2021   RILEIGH KAWASHIMA to be D/C'd Home per MD order.  AVS completed.  Copy for chart, and copy for patient signed, and dated. Patient/caregiver able to verbalize understanding.  Discharge Medication: Allergies as of 11/24/2021   No Known Allergies      Medication List     STOP taking these medications    aspirin 81 MG tablet   carvedilol 12.5 MG tablet Commonly known as: COREG   furosemide 40 MG tablet Commonly known as: LASIX   spironolactone 50 MG tablet Commonly known as: ALDACTONE   tamsulosin 0.4 MG Caps capsule Commonly known as: FLOMAX       TAKE these medications    Centrum Silver 50+Women Tabs Take 1 tablet by mouth daily with breakfast.   Co Q-10 200 MG Caps Take 200 mg by mouth in the morning.   diphenhydrAMINE 25 MG tablet Commonly known as: BENADRYL Take 25 mg by mouth in the morning.   docusate sodium 100 MG capsule Commonly known as: COLACE Take 100 mg by mouth daily.   Eliquis 5 MG Tabs tablet Generic drug: apixaban Take 2 tablets (10 mg) twice daily until 8/26, and then switch to 1 tablet (5 mg) twice daily.   fluticasone furoate-vilanterol 100-25 MCG/ACT Aepb Commonly known as: BREO ELLIPTA Inhale 1-2 puffs into the lungs daily.   gabapentin 600 MG tablet Commonly known as: NEURONTIN Take 600 mg by mouth at bedtime.   HYDROcodone-acetaminophen 10-325 MG tablet Commonly known as: NORCO Take 0.5 tablets by mouth 4 (four) times daily.   Iron (Ferrous Sulfate) 325 (65 Fe) MG Tabs Take 65 mg of iron by mouth daily with breakfast.   latanoprost 0.005 % ophthalmic solution Commonly known as: XALATAN Place 1 drop into both eyes at bedtime.   lovastatin 40 MG tablet Commonly known as: MEVACOR Take 40 mg by mouth at bedtime.   methocarbamol 500 MG tablet Commonly known as: ROBAXIN Take 1 tablet (500 mg total) by mouth every 6 (six) hours as needed for muscle  spasms. What changed:  how much to take when to take this additional instructions   metoprolol tartrate 25 MG tablet Commonly known as: LOPRESSOR Take 0.5 tablets (12.5 mg total) by mouth 2 (two) times daily.   naloxone 4 MG/0.1ML Liqd nasal spray kit Commonly known as: NARCAN Place 1 spray into the nose daily as needed (accidental overdose).   nitroGLYCERIN 0.4 MG SL tablet Commonly known as: NITROSTAT Place 0.4 mg under the tongue every 5 (five) minutes as needed for chest pain.   NONFORMULARY OR COMPOUNDED ITEM Please evaluate and treat for outpatient PT Diagnoses: Deconditioning due to Acute Hypoxia/Shock from saddle embolus   pantoprazole 40 MG tablet Commonly known as: PROTONIX Take 1 tablet (40 mg total) by mouth daily. Start taking on: November 25, 2021   ProAir HFA 108 (90 Base) MCG/ACT inhaler Generic drug: albuterol Inhale 2 puffs into the lungs every 4 (four) hours as needed for wheezing or shortness of breath.   Rybelsus 3 MG Tabs Generic drug: Semaglutide Take 3 mg by mouth daily before breakfast.   triamcinolone ointment 0.5 % Commonly known as: KENALOG Apply 1 Application topically See admin instructions. Apply to affected areas of both feet two to three times a week   trimethoprim 100 MG tablet Commonly known as: TRIMPEX Take 100 mg by mouth in the morning.   Vitamin B-12 500 MCG Subl Place 500 mcg under the tongue  in the morning.        Discharge Assessment: Vitals:   11/24/21 0800 11/24/21 1010  BP:    Pulse: 79   Resp: (!) 23   Temp:    SpO2: 97% 98%   Skin clean, dry and intact without evidence of skin break down, no evidence of skin tears noted. IV catheter discontinued intact. Site without signs and symptoms of complications - no redness or edema noted at insertion site, patient denies c/o pain - only slight tenderness at site.  Dressing with slight pressure applied.  D/c Instructions-Education: Discharge instructions given to  patient/family with verbalized understanding. D/c education completed with patient/family including follow up instructions, medication list, d/c activities limitations if indicated, with other d/c instructions as indicated by MD - patient able to verbalize understanding, all questions fully answered. Patient instructed to return to ED, call 911, or call MD for any changes in condition.  Patient escorted via [place], and D/C home via private auto.  Atilano Ina, RN 11/24/2021 12:20 PM

## 2021-11-24 NOTE — Evaluation (Signed)
Occupational Therapy Evaluation Patient Details Name: Lisa Maxwell MRN: [id number] DOB: 1945/09/28 Today's Date: 11/24/2021   History of Present Illness 76 year old female admitted 11/21/21 for code stroke due to her history of atrial fibrillation and unable to use anticoagulation secondary to falls. She had fallen at home. CT head negative but did show bil saddle PE. 8/18 underwent pulmonary arteriography and mechanical thrombectomy;  PMH CAD, cerebrovascular disease, a-fib, COPD, chronic pain syndrome, renal failure, DDD, DM, HTN, bil TKA, PVD, spinal stenosis lumbar spine with neurogenic claudication   Clinical Impression   Pt in bed upon therapy arrival and agreeable to participate in OT evaluation. Husband arrived to room shortly after therapy evaluation began. Pt able to verbalize current functional status at home with ADL tasks. She has strong family support at home if needed. Currently presenting at baseline with BADL tasks. Pt does not verbalize any concerns regarding returning home. At this time, pt does not require any follow up OT services.  Pt reports she is planning to discharge home today and continue OP PT services for her back pain.      Recommendations for follow up therapy are one component of a multi-disciplinary discharge planning process, led by the attending physician.  Recommendations may be updated based on patient status, additional functional criteria and insurance authorization.   Follow Up Recommendations  No OT follow up    Assistance Recommended at Discharge PRN  Patient can return home with the following A little help with bathing/dressing/bathroom;A little help with walking and/or transfers;Assistance with cooking/housework;Help with stairs or ramp for entrance;Assist for transportation    Functional Status Assessment  Patient has not had a recent decline in their functional status  Equipment Recommendations  None recommended by OT       Precautions /  Restrictions Precautions Precautions: Fall Precaution Comments: right foot drop Restrictions Weight Bearing Restrictions: No      Mobility Bed Mobility Overal bed mobility: Modified Independent Bed Mobility: Supine to Sit     Supine to sit: Modified independent (Device/Increase time), HOB elevated     General bed mobility comments: utilized bed railings Patient Response: Cooperative  Transfers Overall transfer level: Needs assistance Equipment used: Rolling walker (2 wheels) Transfers: Sit to/from Stand, Bed to chair/wheelchair/BSC Sit to Stand: Supervision, From elevated surface     Step pivot transfers: Supervision            Balance Overall balance assessment: Mild deficits observed, not formally tested (Due to right foot drop)       ADL either performed or assessed with clinical judgement   ADL Overall ADL's : At baseline;Modified independent         Vision Baseline Vision/History: 0 No visual deficits Ability to See in Adequate Light: 0 Adequate Patient Visual Report: No change from baseline Vision Assessment?: No apparent visual deficits     Perception Perception Perception: Within Functional Limits   Praxis Praxis Praxis: Intact    Pertinent Vitals/Pain Pain Assessment Pain Assessment: No/denies pain     Hand Dominance Right   Extremity/Trunk Assessment Upper Extremity Assessment Upper Extremity Assessment: Overall WFL for tasks assessed   Lower Extremity Assessment Lower Extremity Assessment: Defer to PT evaluation RLE Deficits / Details: Rt foot drop       Communication Communication Communication: No difficulties   Cognition Arousal/Alertness: Awake/alert Behavior During Therapy: WFL for tasks assessed/performed Overall Cognitive Status: Within Functional Limits for tasks assessed         General Comments  Nasal cannula on  although oxygen turned off at wall. On room air, pt's SPO2 remained at 98%.            Home  Living Family/patient expects to be discharged to:: Private residence Living Arrangements: Spouse/significant other Available Help at Discharge: Family;Available 24 hours/day Type of Home: House Home Access: Ramped entrance     Home Layout: Two level;Bed/bath upstairs;Able to live on main level with bedroom/bathroom (Pt  sleeps on couch) Alternate Level Stairs-Number of Steps: 4 steps Alternate Level Stairs-Rails: Right Bathroom Shower/Tub: Walk-in shower (At [employer] home. Uses a shower chair)   Bathroom Toilet:  (Uses BSC near couch)     Home Equipment: Rolling Walker (2 wheels);Rollator (4 wheels);BSC/3in1;Shower seat - built in;Grab bars - tub/shower;Wheelchair - Biomedical scientist Comments: Goes to niece's house for shower once a week      Prior Functioning/Environment Prior Level of Function : Needs assist             Mobility Comments: uses rollator inside and w/c for leaving home; going to OPPT for her back pain ADLs Comments: Reports she does her ADLs. Husband does IADLs. Sometimes will receive assist from her husband for getting shoes on.        OT Problem List: Decreased strength         OT Goals(Current goals can be found in the care plan section)    OT Frequency:  One time visit       AM-PAC OT "6 Clicks" Daily Activity     Outcome Measure Help from another person eating meals?: None Help from another person taking care of personal grooming?: None Help from another person toileting, which includes using toliet, bedpan, or urinal?: None Help from another person bathing (including washing, rinsing, drying)?: None Help from another person to put on and taking off regular upper body clothing?: None Help from another person to put on and taking off regular lower body clothing?: None 6 Click Score: 24   End of Session Equipment Utilized During Treatment: Rolling walker (2 wheels) Nurse Communication: Mobility status;Other (comment) (removal of  periwick)  Activity Tolerance: Patient tolerated treatment well Patient left: in chair;with call bell/phone within reach;with family/visitor present  OT Visit Diagnosis: Muscle weakness (generalized) (M62.81);History of falling (Z91.81)                Time: 1856-3149 OT Time Calculation (min): 28 min Charges:  OT General Charges $OT Visit: 1 Visit OT Evaluation $OT Eval Low Complexity: 1 Low  Ailene Ravel, OTR/L,CBIS  Supplemental OT - [facility] and [facility]   Eulon Allnutt, Clarene Duke 11/24/2021, 10:14 AM

## 2021-11-24 NOTE — Telephone Encounter (Signed)
Patient is scheduled 9/27 at 11:30am with JD- reminder mailed to address on file- nothing further needed.

## 2021-11-24 NOTE — Discharge Summary (Signed)
PATIENT DETAILS Name: CLARICE ZULAUF Age: 76 y.o. Sex: female Date of Birth: 25-Feb-1946 MRN: [id number]. Admitting Physician: Freddi Starr, MD [id number], Lora Havens, MD  Admit Date: 11/21/2021 Discharge date: 11/24/2021  Recommendations for Outpatient Follow-up:  Follow up with PCP in 1-2 weeks Please obtain CMP/CBC in one week Please ensure follow-up with cardiology/pulmonology/ENT (see below) Given poor long-term candidate for anticoagulation-we will need to do risk/benefits/further discussion regarding optimal duration of Eliquis for saddle embolus.  Admitted From:  Home  Disposition: Home/outpatient PT   Discharge Condition: fair  CODE STATUS:   Code Status: Full Code   Diet recommendation:  Diet Order             Diet - low sodium heart healthy           Diet heart healthy/carb modified Room service appropriate? Yes; Fluid consistency: Thin  Diet effective now                    Brief Summary: Patient is a 76 y.o.  female with history of chronic atrial fibrillation, HTN, HLD, frequent falls, chronic venous syndrome-s/p L2-L3 decompression May 2023, chronic back pain-s/p spinal cord stimulator in place, COPD-who presented with altered mental status-initially there was some concern whether she had acute CVA-but upon further evaluation she was found to have cardiogenic shock-due to massive saddle embolus.  She was evaluated by IR-underwent mechanical thrombectomy-monitor in the ICU closely-started on anticoagulation and transferred to [facility] service on 8/20.   Significant events: 8/18>> admit to ICU-AMS-shock-saddle PE.  IR consulted for mechanical thrombectomy. 8/20>> transfer to [facility]   Significant studies: 8/18>> CT head: No acute abnormality 8/18>> CT angio head/neck: Negative for LVO, severe atherosclerotic stenosis at the left vertebral artery origin-but vessel remains patent. 8/18>> CT angio chest: Saddle pulmonary embolus 8/18>> Echo: EF 60-65%, no  regional wall motion abnormality, RV systolic function moderately reduced-RVSP 64.6 mmHg. 8/20>> bilateral lower extremity Doppler: DVT left posterior tibial vein.   Significant microbiology data: 8/18>> blood culture: No growth 8/18>> COVID/flu PCR: Negative   Procedures: 8/18>> mechanical thrombectomy   Consults: PCCM IR Neurology  Brief Hospital Course: Acute metabolic encephalopathy: Due to shock/saddle PE-resolved.  Not felt to have CVA-CVA ruled out.   Cardiogenic shock due to RV failure in the setting of saddle embolus: Shock physiology has resolved-BP stable-s/p mechanical thrombectomy.   Saddle pulmonary embolism-left lower extremity DVT-with RV failure: S/p mechanical thrombectomy-now on Eliquis.  Suspicion for provoked PE in the setting of chronic debility/immobility/sedentary lifestyle.  Will need ongoing discussions with patient-consultants-regarding optimal duration of anticoagulation.   Acute hypoxic respiratory failure due to pulmonary embolism: On minimal amount of O2 this morning-have asked RN to titrate FiO2.   Elevated troponin: Due to PE/demand ischemia-no clinical suspicion for MI   Chronic atrial fibrillation: Rate controlled with beta-blocker-previously not considered high candidate for long-term anticoagulation-however now on Eliquis given saddle pulmonary embolus.   CAD: No anginal symptoms   HTN: BP currently stable on low-dose metoprolol-Aldactone on hold.  Resume Aldactone when able.   DM-2 (A1c 6.5 on 5/23): Continue SSI and follow.  Appears to be diet controlled at home.  HLD: Continue statin   Anemia: Mild-due to acute illness-no evidence of bleeding-follow.   Thrombocytopenia: Mild-likely due to consumption from large PE.  Follow.   AKI on CKD stage IIIb: AKI hemodynamically mediated-improving-supportive care.   COPD: Not in exacerbation-continue bronchodilators.   Chronic back pain-s/p spinal cord stimulator in place-recent history of  cauda equina  syndrome-s/p L2-L3 decompression: Pain appears stable-continue Neurontin, as needed narcotics.  Patient is minimally ambulatory-mostly sits in a wheelchair/couch-but able to take a few steps.  History of frequent falls.  Evaluated by physical therapy-outpatient physical therapy recommended.   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  Pressure Ulcer: Pressure Injury 11/21/21 Buttocks Left;Medial Stage 2 -  Partial thickness loss of dermis presenting as a shallow open injury with a red, pink wound bed without slough. (Active)  11/21/21 1112  Location: Buttocks  Location Orientation: Left;Medial  Staging: Stage 2 -  Partial thickness loss of dermis presenting as a shallow open injury with a red, pink wound bed without slough.  Wound Description (Comments):   Present on Admission: Yes  Dressing Type Foam - Lift dressing to assess site every shift 11/24/21 0300    Morbid Obesity: Estimated body mass index is 37.96 kg/m as calculated from the following:   Height as of this encounter: $RemoveBeforeD'4\' 8"'uSXBwhRjsVDqyc$  (1.422 m).   Weight as of this encounter: 76.8 kg.    Discharge Diagnoses:  Principal Problem:   Pulmonary embolism (HCC) Active Problems:   Pressure injury of skin   Discharge Instructions:  Activity:  As tolerated with Full fall precautions use walker/cane & assistance as needed   Discharge Instructions     Call MD for:  difficulty breathing, headache or visual disturbances   Complete by: As directed    Call MD for:  extreme fatigue   Complete by: As directed    Call MD for:  persistant dizziness or light-headedness   Complete by: As directed    Diet - low sodium heart healthy   Complete by: As directed    Discharge instructions   Complete by: As directed    Follow with Primary MD  Nicoletta Dress, MD in 1-2  weeks  Please get a complete blood count and chemistry panel checked by your Primary MD at your next visit, and again as instructed by your Primary MD.  Get Medicines reviewed and adjusted: Please take all your medications with you for your next visit with your Primary MD  Laboratory/radiological data: Please request your Primary MD to go over all hospital tests and procedure/radiological results at the follow up, please ask your Primary MD to get all Hospital records sent to his/her office.  In some cases, they will be blood work, cultures and biopsy results pending at the time of your discharge. Please request that your primary care M.D. follows up on these results.  Also Note the following: If you experience worsening of your admission symptoms, develop shortness of breath, life threatening emergency, suicidal or homicidal thoughts you must seek medical attention immediately by calling 911 or calling your MD immediately  if symptoms less severe.  You must read complete instructions/literature along with all the possible adverse reactions/side effects for all the Medicines you take and that have been prescribed to you. Take any new Medicines after you have completely understood and accpet all the possible adverse reactions/side effects.   Do not drive when taking Pain medications or sleeping medications (Benzodaizepines)  Do not take more than prescribed Pain, Sleep and Anxiety Medications. It is not advisable to combine anxiety,sleep and pain medications without talking with your primary care practitioner  Special Instructions: If you have smoked or chewed Tobacco  in the last 2 yrs please stop smoking, stop any regular Alcohol  and  or any Recreational drug use.  Wear Seat belts while driving.  Please note: You were cared for by a hospitalist during your hospital stay. Once you are discharged, your primary care physician will handle any further medical issues. Please note that NO REFILLS  for any discharge medications will be authorized once you are discharged, as it is imperative that you return to your primary care physician (or establish a relationship with a primary care physician if you do not have one) for your post hospital discharge needs so that they can reassess your need for medications and monitor your lab values.   Increase activity slowly   Complete by: As directed    No wound care   Complete by: As directed       Allergies as of 11/24/2021   No Known Allergies      Medication List     STOP taking these medications    aspirin 81 MG tablet   carvedilol 12.5 MG tablet Commonly known as: COREG   furosemide 40 MG tablet Commonly known as: LASIX   spironolactone 50 MG tablet Commonly known as: ALDACTONE   tamsulosin 0.4 MG Caps capsule Commonly known as: FLOMAX       TAKE these medications    apixaban 5 MG Tabs tablet Commonly known as: ELIQUIS Take 2 tablets (10 mg) twice daily until 8/26, and then switch to 1 tablet (5 mg) twice daily.   Centrum Silver 50+Women Tabs Take 1 tablet by mouth daily with breakfast.   Co Q-10 200 MG Caps Take 200 mg by mouth in the morning.   diphenhydrAMINE 25 MG tablet Commonly known as: BENADRYL Take 25 mg by mouth in the morning.   docusate sodium 100 MG capsule Commonly known as: COLACE Take 100 mg by mouth daily.   fluticasone furoate-vilanterol 100-25 MCG/ACT Aepb Commonly known as: BREO ELLIPTA Inhale 1-2 puffs into the lungs daily.   gabapentin 600 MG tablet Commonly known as: NEURONTIN Take 600 mg by mouth at bedtime.   HYDROcodone-acetaminophen 10-325 MG tablet Commonly known as: NORCO Take 0.5 tablets by mouth 4 (four) times daily.   Iron (Ferrous Sulfate) 325 (65 Fe) MG Tabs Take 65 mg of iron by mouth daily with breakfast.   latanoprost 0.005 % ophthalmic solution Commonly known as: XALATAN Place 1 drop into both eyes at bedtime.   lovastatin 40 MG tablet Commonly known as:  MEVACOR Take 40 mg by mouth at bedtime.   methocarbamol 500 MG tablet Commonly known as: ROBAXIN Take 1 tablet (500 mg total) by mouth every 6 (six) hours as needed for muscle spasms. What changed:  how much to take when to take this additional instructions   metoprolol tartrate 25 MG tablet Commonly known as: LOPRESSOR Take 0.5 tablets (12.5 mg total) by mouth 2 (two) times daily.   naloxone 4 MG/0.1ML Liqd nasal spray kit Commonly known as: NARCAN Place 1 spray into the nose daily as needed (accidental overdose).   nitroGLYCERIN 0.4 MG SL tablet Commonly known as: NITROSTAT Place 0.4 mg under the tongue every 5 (five) minutes as needed for chest pain.   NONFORMULARY OR COMPOUNDED ITEM Please evaluate and treat for outpatient PT Diagnoses: Deconditioning due to Acute Hypoxia/Shock from saddle embolus   pantoprazole 40 MG tablet Commonly known as: PROTONIX Take 1 tablet (40 mg total) by mouth daily. Start taking on: November 25, 2021   ProAir HFA 108 (90 Base) MCG/ACT inhaler Generic drug: albuterol Inhale 2 puffs into the lungs every 4 (  four) hours as needed for wheezing or shortness of breath.   Rybelsus 3 MG Tabs Generic drug: Semaglutide Take 3 mg by mouth daily before breakfast.   triamcinolone ointment 0.5 % Commonly known as: KENALOG Apply 1 Application topically See admin instructions. Apply to affected areas of both feet two to three times a week   trimethoprim 100 MG tablet Commonly known as: TRIMPEX Take 100 mg by mouth in the morning.   Vitamin B-12 500 MCG Subl Place 500 mcg under the tongue in the morning.        Follow-up Information     Raylene Miyamoto, MD. Schedule an appointment as soon as possible for a visit in 2 week(s).   Specialty: Otolaryngology Contact information: [address] [place] [address] [phone]         Nicoletta Dress, MD. Schedule an appointment as soon as possible for a visit in 1 week(s).    Specialty: Internal Medicine Contact information: [place] [address] [phone]         RevankarReita Cliche, MD. Schedule an appointment as soon as possible for a visit in 2 week(s).   Specialty: Cardiology Contact information: [place] [address] [phone]         Noemi Chapel P, DO Follow up.   Specialty: Pulmonary Disease Why: Office will call with date/time, If you dont hear from them,please give them a call Contact information: [place] [address] [phone]                No Known Allergies   Other Procedures/Studies: VAS [place] LOWER EXTREMITY VENOUS (DVT)  Result Date: 11/23/2021  Lower Venous DVT Study Patient Name:  HARLEM THRESHER [facility]  Date of Exam:   11/23/2021 Medical Rec #: [id number]       Accession #:    [id number] Date of Birth: 1945/06/05       Patient Gender: F Patient Age:   43 years Exam Location:  [facility] Procedure:      VAS [place] LOWER EXTREMITY VENOUS (DVT) Referring Phys: Oren Binet --------------------------------------------------------------------------------  Indications: Swelling, and pulmonary embolism.  Risk Factors: Confirmed PE. Limitations: Poor ultrasound/tissue interface. Comparison Study: No prior studies. Performing Technologist: Oliver Hum RVT  Examination Guidelines: A complete evaluation includes B-mode imaging, spectral Doppler, color Doppler, and power Doppler as needed of all accessible portions of each vessel. Bilateral testing is considered an integral part of a complete examination. Limited examinations for reoccurring indications may be performed as noted. The reflux portion of the exam is performed with the patient in reverse Trendelenburg.  +---------+---------------+---------+-----------+----------+--------------+ RIGHT    CompressibilityPhasicitySpontaneityPropertiesThrombus Aging  +---------+---------------+---------+-----------+----------+--------------+ CFV      Full           Yes      Yes                                 +---------+---------------+---------+-----------+----------+--------------+ SFJ      Full                                                        +---------+---------------+---------+-----------+----------+--------------+ FV Prox  Full                                                        +---------+---------------+---------+-----------+----------+--------------+  FV Mid   Full                                                        +---------+---------------+---------+-----------+----------+--------------+ FV DistalFull                                                        +---------+---------------+---------+-----------+----------+--------------+ PFV      Full                                                        +---------+---------------+---------+-----------+----------+--------------+ POP      Full           Yes      Yes                                 +---------+---------------+---------+-----------+----------+--------------+ PTV      Full                                                        +---------+---------------+---------+-----------+----------+--------------+ PERO     Full                                                        +---------+---------------+---------+-----------+----------+--------------+   +---------+---------------+---------+-----------+----------+--------------+ LEFT     CompressibilityPhasicitySpontaneityPropertiesThrombus Aging +---------+---------------+---------+-----------+----------+--------------+ CFV      Full           Yes      Yes                                 +---------+---------------+---------+-----------+----------+--------------+ SFJ      Full                                                         +---------+---------------+---------+-----------+----------+--------------+ FV Prox  Full                                                        +---------+---------------+---------+-----------+----------+--------------+ FV Mid   Full                                                        +---------+---------------+---------+-----------+----------+--------------+  FV DistalFull                                                        +---------+---------------+---------+-----------+----------+--------------+ PFV      Full                                                        +---------+---------------+---------+-----------+----------+--------------+ POP      Full           Yes      Yes                                 +---------+---------------+---------+-----------+----------+--------------+ PTV      None                                         Acute          +---------+---------------+---------+-----------+----------+--------------+ PERO     Full                                                        +---------+---------------+---------+-----------+----------+--------------+     Summary: RIGHT: - There is no evidence of deep vein thrombosis in the lower extremity.  - No cystic structure found in the popliteal fossa.  LEFT: - Findings consistent with acute deep vein thrombosis involving one of the paired left posterior tibial veins.  *See table(s) above for measurements and observations. Electronically signed by Jamelle Haring on 11/23/2021 at 2:58:43 PM.    Final    IR THROMBECT PRIM MECH INIT (INCLU) MOD SED  Result Date: 11/22/2021 INDICATION: Briefly, 76 year old female comorbid who presented with syncope and hypotension, with workup revealing massive PE. RV/LV 2.2. EXAM: Procedures; 1. ULTRASOUND GUIDANCE FOR VENOUS ACCESS 2. PULMONARY ARTERIOGRAPHY 3. FLUOROSCOPIC GUIDED CATHETER DIRECTED BILATERAL PULMONARY THROMBECTOMY Performing Physician: Michaelle Birks, MD  Assistant(s): Ruthann Cancer, MD A qualified trainee/resident or advanced practice provider (APP) was not immediately available to assist with this case. COMPARISON:  CTA PE, earlier same day. MEDICATIONS: Heparin 3K IU IV ANESTHESIA/SEDATION: Sedation by the Anesthesia Team was performed. Please see anesthesiology log for details. CONTRAST:  75mL OMNIPAQUE IOHEXOL 300 MG/ML SOLN, 76mL OMNIPAQUE IOHEXOL 300 MG/ML SOLN FLUOROSCOPY TIME:  Fluoroscopic dose; 115 mGy COMPLICATIONS: None immediate. TECHNIQUE: Informed written consent was obtained from the the patient and/or patient's representative after a discussion of the risks, benefits and alternatives to treatment. Questions regarding the procedure were encouraged and answered. A timeout was performed prior to the initiation of the procedure. Ultrasound scanning was performed of the RIGHT groin and demonstrated wide patency of the RIGHT common femoral and GSV vein. The RIGHT groin was prepped and draped in the usual sterile fashion, and a sterile drape was applied covering the operative field. Maximum barrier sterile technique with sterile gowns and gloves were used for the procedure. A timeout was performed  prior to the initiation of the procedure. Local anesthesia was provided with 1% lidocaine. Under direct ultrasound guidance, the RIGHT greater saphenous vein was accessed with a micro puncture kit ultimately allowing placement of a 6 Fr vascular sheath. Ultrasound and fluoroscopic spot images were saved for procedural documentation purposes. With the use of a 0.035 inch Bentson wire and an angled pigtail pulmonary catheter, access past the RIGHT heart and into the main pulmonary artery was obtained. Pre intervention pulmonary arterial pressures were obtained from the main pulmonary artery, then a limited central pulmonary arteriogram was performed. The Bentson wire was exchanged for an Amplatz wire then a 16 Fr, 65 cm GORE dry seal sheath then a 16 Fr Penumbra  Lightning aspiration catheter was advanced into the LEFT then RIGHT pulmonary arteries and catheter directed thrombectomy was performed. Intermittent and postprocedural LEFT than RIGHT pulmonary arteriograms were performed confirming thrombus removal. Post interventional pulmonary arterial pressures were obtained from the main pulmonary artery. The catheters and sheath were then removed, and hemostasis was obtained by Perclose ProGlide and manual pressure given the large bore access. The patient tolerated the procedure well without immediate postprocedural complication. FINDINGS: Central pulmonary arteriogram demonstrates large burden bilateral pulmonary emboli with saddle morphology, and obstructing the LEFT inferior lobar branch and RIGHT pulmonary arterial bifurcation. Acquired pressure measurements: Pre: Main PA mean 40 (normal: < 25/10) Post: Main PA mean 33 (normal: < 25/10) Post intervention pulmonary arteriograms demonstrating adequate clearance from main PA and pulmonary arterial bifurcations. IMPRESSION: 1. Pulmonary arteriogram revealing large burden bilateral pulmonary emboli with saddle morphology, and obstructing the LEFT inferior lobar branch and RIGHT pulmonary arterial bifurcation. 2. Elevated pressure within the main PA compatible with critical pulmonary arterial hypertension. 3. Successful catheter-directed bilateral pulmonary thrombectomy with adequate clearance from the main PA and pulmonary arterial bifurcations. PLAN: Continue systemic anticoagulation with heparin gtt. Management per pulmonary and critical care team. Consider bilateral lower extremities Doppler US to evaluate for DVT. Procedural results were called by telephone at the time of completion to provider Freda Jackson, MD (PCCM) who verbally acknowledged these results. Michaelle Birks, MD [facility] Electronically Signed   By: Michaelle Birks M.D.   On: 11/22/2021 14:50   IR  US Guide Vasc Access Right  Result Date: 11/22/2021 INDICATION: Briefly, 76 year old female comorbid who presented with syncope and hypotension, with workup revealing massive PE. RV/LV 2.2. EXAM: Procedures; 1. ULTRASOUND GUIDANCE FOR VENOUS ACCESS 2. PULMONARY ARTERIOGRAPHY 3. FLUOROSCOPIC GUIDED CATHETER DIRECTED BILATERAL PULMONARY THROMBECTOMY Performing Physician: Michaelle Birks, MD Assistant(s): Ruthann Cancer, MD A qualified trainee/resident or advanced practice provider (APP) was not immediately available to assist with this case. COMPARISON:  CTA PE, earlier same day. MEDICATIONS: Heparin 3K IU IV ANESTHESIA/SEDATION: Sedation by the Anesthesia Team was performed. Please see anesthesiology log for details. CONTRAST:  40mL OMNIPAQUE IOHEXOL 300 MG/ML SOLN, 56mL OMNIPAQUE IOHEXOL 300 MG/ML SOLN FLUOROSCOPY TIME:  Fluoroscopic dose; 953 mGy COMPLICATIONS: None immediate. TECHNIQUE: Informed written consent was obtained from the the patient and/or patient's representative after a discussion of the risks, benefits and alternatives to treatment. Questions regarding the procedure were encouraged and answered. A timeout was performed prior to the initiation of the procedure. Ultrasound scanning was performed of the RIGHT groin and demonstrated wide patency of the RIGHT common femoral and GSV vein. The RIGHT groin was prepped and draped in the usual sterile fashion, and a sterile drape was applied covering the operative field. Maximum barrier sterile technique with sterile gowns and  gloves were used for the procedure. A timeout was performed prior to the initiation of the procedure. Local anesthesia was provided with 1% lidocaine. Under direct ultrasound guidance, the RIGHT greater saphenous vein was accessed with a micro puncture kit ultimately allowing placement of a 6 Fr vascular sheath. Ultrasound and fluoroscopic spot images were saved for procedural documentation purposes. With the use of a 0.035 inch Bentson  wire and an angled pigtail pulmonary catheter, access past the RIGHT heart and into the main pulmonary artery was obtained. Pre intervention pulmonary arterial pressures were obtained from the main pulmonary artery, then a limited central pulmonary arteriogram was performed. The Bentson wire was exchanged for an Amplatz wire then a 16 Fr, 65 cm GORE dry seal sheath then a 16 Fr Penumbra Lightning aspiration catheter was advanced into the LEFT then RIGHT pulmonary arteries and catheter directed thrombectomy was performed. Intermittent and postprocedural LEFT than RIGHT pulmonary arteriograms were performed confirming thrombus removal. Post interventional pulmonary arterial pressures were obtained from the main pulmonary artery. The catheters and sheath were then removed, and hemostasis was obtained by Perclose ProGlide and manual pressure given the large bore access. The patient tolerated the procedure well without immediate postprocedural complication. FINDINGS: Central pulmonary arteriogram demonstrates large burden bilateral pulmonary emboli with saddle morphology, and obstructing the LEFT inferior lobar branch and RIGHT pulmonary arterial bifurcation. Acquired pressure measurements: Pre: Main PA mean 40 (normal: < 25/10) Post: Main PA mean 33 (normal: < 25/10) Post intervention pulmonary arteriograms demonstrating adequate clearance from main PA and pulmonary arterial bifurcations. IMPRESSION: 1. Pulmonary arteriogram revealing large burden bilateral pulmonary emboli with saddle morphology, and obstructing the LEFT inferior lobar branch and RIGHT pulmonary arterial bifurcation. 2. Elevated pressure within the main PA compatible with critical pulmonary arterial hypertension. 3. Successful catheter-directed bilateral pulmonary thrombectomy with adequate clearance from the main PA and pulmonary arterial bifurcations. PLAN: Continue systemic anticoagulation with heparin gtt. Management per pulmonary and critical care  team. Consider bilateral lower extremities Doppler US to evaluate for DVT. Procedural results were called by telephone at the time of completion to provider Freda Jackson, MD (PCCM) who verbally acknowledged these results. Michaelle Birks, MD [facility] Radiology Electronically Signed   By: Michaelle Birks M.D.   On: 11/22/2021 14:50   IR Angiogram Selective Each Additional Vessel  Result Date: 11/22/2021 INDICATION: Briefly, 76 year old female comorbid who presented with syncope and hypotension, with workup revealing massive PE. RV/LV 2.2. EXAM: Procedures; 1. ULTRASOUND GUIDANCE FOR VENOUS ACCESS 2. PULMONARY ARTERIOGRAPHY 3. FLUOROSCOPIC GUIDED CATHETER DIRECTED BILATERAL PULMONARY THROMBECTOMY Performing Physician: Michaelle Birks, MD Assistant(s): Ruthann Cancer, MD A qualified trainee/resident or advanced practice provider (APP) was not immediately available to assist with this case. COMPARISON:  CTA PE, earlier same day. MEDICATIONS: Heparin 3K IU IV ANESTHESIA/SEDATION: Sedation by the Anesthesia Team was performed. Please see anesthesiology log for details. CONTRAST:  86mL OMNIPAQUE IOHEXOL 300 MG/ML SOLN, 72mL OMNIPAQUE IOHEXOL 300 MG/ML SOLN FLUOROSCOPY TIME:  Fluoroscopic dose; 761 mGy COMPLICATIONS: None immediate. TECHNIQUE: Informed written consent was obtained from the the patient and/or patient's representative after a discussion of the risks, benefits and alternatives to treatment. Questions regarding the procedure were encouraged and answered. A timeout was performed prior to the initiation of the procedure. Ultrasound scanning was performed of the RIGHT groin and demonstrated wide patency of the RIGHT common femoral and GSV vein. The RIGHT groin was prepped and draped in the usual sterile fashion, and a sterile drape was applied covering the  operative field. Maximum barrier sterile technique with sterile gowns and gloves were used for the procedure. A  timeout was performed prior to the initiation of the procedure. Local anesthesia was provided with 1% lidocaine. Under direct ultrasound guidance, the RIGHT greater saphenous vein was accessed with a micro puncture kit ultimately allowing placement of a 6 Fr vascular sheath. Ultrasound and fluoroscopic spot images were saved for procedural documentation purposes. With the use of a 0.035 inch Bentson wire and an angled pigtail pulmonary catheter, access past the RIGHT heart and into the main pulmonary artery was obtained. Pre intervention pulmonary arterial pressures were obtained from the main pulmonary artery, then a limited central pulmonary arteriogram was performed. The Bentson wire was exchanged for an Amplatz wire then a 16 Fr, 65 cm GORE dry seal sheath then a 16 Fr Penumbra Lightning aspiration catheter was advanced into the LEFT then RIGHT pulmonary arteries and catheter directed thrombectomy was performed. Intermittent and postprocedural LEFT than RIGHT pulmonary arteriograms were performed confirming thrombus removal. Post interventional pulmonary arterial pressures were obtained from the main pulmonary artery. The catheters and sheath were then removed, and hemostasis was obtained by Perclose ProGlide and manual pressure given the large bore access. The patient tolerated the procedure well without immediate postprocedural complication. FINDINGS: Central pulmonary arteriogram demonstrates large burden bilateral pulmonary emboli with saddle morphology, and obstructing the LEFT inferior lobar branch and RIGHT pulmonary arterial bifurcation. Acquired pressure measurements: Pre: Main PA mean 40 (normal: < 25/10) Post: Main PA mean 33 (normal: < 25/10) Post intervention pulmonary arteriograms demonstrating adequate clearance from main PA and pulmonary arterial bifurcations. IMPRESSION: 1. Pulmonary arteriogram revealing large burden bilateral pulmonary emboli with saddle morphology, and obstructing the LEFT  inferior lobar branch and RIGHT pulmonary arterial bifurcation. 2. Elevated pressure within the main PA compatible with critical pulmonary arterial hypertension. 3. Successful catheter-directed bilateral pulmonary thrombectomy with adequate clearance from the main PA and pulmonary arterial bifurcations. PLAN: Continue systemic anticoagulation with heparin gtt. Management per pulmonary and critical care team. Consider bilateral lower extremities Doppler US to evaluate for DVT. Procedural results were called by telephone at the time of completion to provider Freda Jackson, MD (PCCM) who verbally acknowledged these results. Michaelle Birks, MD [facility] Radiology Electronically Signed   By: Michaelle Birks M.D.   On: 11/22/2021 14:50   IR Angiogram Pulmonary Bilateral Selective  Result Date: 11/22/2021 INDICATION: Briefly, 76 year old female comorbid who presented with syncope and hypotension, with workup revealing massive PE. RV/LV 2.2. EXAM: Procedures; 1. ULTRASOUND GUIDANCE FOR VENOUS ACCESS 2. PULMONARY ARTERIOGRAPHY 3. FLUOROSCOPIC GUIDED CATHETER DIRECTED BILATERAL PULMONARY THROMBECTOMY Performing Physician: Michaelle Birks, MD Assistant(s): Ruthann Cancer, MD A qualified trainee/resident or advanced practice provider (APP) was not immediately available to assist with this case. COMPARISON:  CTA PE, earlier same day. MEDICATIONS: Heparin 3K IU IV ANESTHESIA/SEDATION: Sedation by the Anesthesia Team was performed. Please see anesthesiology log for details. CONTRAST:  60mL OMNIPAQUE IOHEXOL 300 MG/ML SOLN, 24mL OMNIPAQUE IOHEXOL 300 MG/ML SOLN FLUOROSCOPY TIME:  Fluoroscopic dose; 811 mGy COMPLICATIONS: None immediate. TECHNIQUE: Informed written consent was obtained from the the patient and/or patient's representative after a discussion of the risks, benefits and alternatives to treatment. Questions regarding the procedure were encouraged and answered. A timeout was  performed prior to the initiation of the procedure. Ultrasound scanning was performed of the RIGHT groin and demonstrated wide patency of the RIGHT common femoral and GSV vein. The RIGHT groin was prepped and draped in the usual  sterile fashion, and a sterile drape was applied covering the operative field. Maximum barrier sterile technique with sterile gowns and gloves were used for the procedure. A timeout was performed prior to the initiation of the procedure. Local anesthesia was provided with 1% lidocaine. Under direct ultrasound guidance, the RIGHT greater saphenous vein was accessed with a micro puncture kit ultimately allowing placement of a 6 Fr vascular sheath. Ultrasound and fluoroscopic spot images were saved for procedural documentation purposes. With the use of a 0.035 inch Bentson wire and an angled pigtail pulmonary catheter, access past the RIGHT heart and into the main pulmonary artery was obtained. Pre intervention pulmonary arterial pressures were obtained from the main pulmonary artery, then a limited central pulmonary arteriogram was performed. The Bentson wire was exchanged for an Amplatz wire then a 16 Fr, 65 cm GORE dry seal sheath then a 16 Fr Penumbra Lightning aspiration catheter was advanced into the LEFT then RIGHT pulmonary arteries and catheter directed thrombectomy was performed. Intermittent and postprocedural LEFT than RIGHT pulmonary arteriograms were performed confirming thrombus removal. Post interventional pulmonary arterial pressures were obtained from the main pulmonary artery. The catheters and sheath were then removed, and hemostasis was obtained by Perclose ProGlide and manual pressure given the large bore access. The patient tolerated the procedure well without immediate postprocedural complication. FINDINGS: Central pulmonary arteriogram demonstrates large burden bilateral pulmonary emboli with saddle morphology, and obstructing the LEFT inferior lobar branch and RIGHT  pulmonary arterial bifurcation. Acquired pressure measurements: Pre: Main PA mean 40 (normal: < 25/10) Post: Main PA mean 33 (normal: < 25/10) Post intervention pulmonary arteriograms demonstrating adequate clearance from main PA and pulmonary arterial bifurcations. IMPRESSION: 1. Pulmonary arteriogram revealing large burden bilateral pulmonary emboli with saddle morphology, and obstructing the LEFT inferior lobar branch and RIGHT pulmonary arterial bifurcation. 2. Elevated pressure within the main PA compatible with critical pulmonary arterial hypertension. 3. Successful catheter-directed bilateral pulmonary thrombectomy with adequate clearance from the main PA and pulmonary arterial bifurcations. PLAN: Continue systemic anticoagulation with heparin gtt. Management per pulmonary and critical care team. Consider bilateral lower extremities Doppler US to evaluate for DVT. Procedural results were called by telephone at the time of completion to provider Freda Jackson, MD (PCCM) who verbally acknowledged these results. Michaelle Birks, MD [facility] Radiology Electronically Signed   By: Michaelle Birks M.D.   On: 11/22/2021 14:50   IR Angiogram Selective Each Additional Vessel  Result Date: 11/22/2021 INDICATION: Briefly, 76 year old female comorbid who presented with syncope and hypotension, with workup revealing massive PE. RV/LV 2.2. EXAM: Procedures; 1. ULTRASOUND GUIDANCE FOR VENOUS ACCESS 2. PULMONARY ARTERIOGRAPHY 3. FLUOROSCOPIC GUIDED CATHETER DIRECTED BILATERAL PULMONARY THROMBECTOMY Performing Physician: Michaelle Birks, MD Assistant(s): Ruthann Cancer, MD A qualified trainee/resident or advanced practice provider (APP) was not immediately available to assist with this case. COMPARISON:  CTA PE, earlier same day. MEDICATIONS: Heparin 3K IU IV ANESTHESIA/SEDATION: Sedation by the Anesthesia Team was performed. Please see anesthesiology log for details. CONTRAST:   32mL OMNIPAQUE IOHEXOL 300 MG/ML SOLN, 34mL OMNIPAQUE IOHEXOL 300 MG/ML SOLN FLUOROSCOPY TIME:  Fluoroscopic dose; 725 mGy COMPLICATIONS: None immediate. TECHNIQUE: Informed written consent was obtained from the the patient and/or patient's representative after a discussion of the risks, benefits and alternatives to treatment. Questions regarding the procedure were encouraged and answered. A timeout was performed prior to the initiation of the procedure. Ultrasound scanning was performed of the RIGHT groin and demonstrated wide patency of the RIGHT common femoral and GSV vein.  The RIGHT groin was prepped and draped in the usual sterile fashion, and a sterile drape was applied covering the operative field. Maximum barrier sterile technique with sterile gowns and gloves were used for the procedure. A timeout was performed prior to the initiation of the procedure. Local anesthesia was provided with 1% lidocaine. Under direct ultrasound guidance, the RIGHT greater saphenous vein was accessed with a micro puncture kit ultimately allowing placement of a 6 Fr vascular sheath. Ultrasound and fluoroscopic spot images were saved for procedural documentation purposes. With the use of a 0.035 inch Bentson wire and an angled pigtail pulmonary catheter, access past the RIGHT heart and into the main pulmonary artery was obtained. Pre intervention pulmonary arterial pressures were obtained from the main pulmonary artery, then a limited central pulmonary arteriogram was performed. The Bentson wire was exchanged for an Amplatz wire then a 16 Fr, 65 cm GORE dry seal sheath then a 16 Fr Penumbra Lightning aspiration catheter was advanced into the LEFT then RIGHT pulmonary arteries and catheter directed thrombectomy was performed. Intermittent and postprocedural LEFT than RIGHT pulmonary arteriograms were performed confirming thrombus removal. Post interventional pulmonary arterial pressures were obtained from the main pulmonary artery.  The catheters and sheath were then removed, and hemostasis was obtained by Perclose ProGlide and manual pressure given the large bore access. The patient tolerated the procedure well without immediate postprocedural complication. FINDINGS: Central pulmonary arteriogram demonstrates large burden bilateral pulmonary emboli with saddle morphology, and obstructing the LEFT inferior lobar branch and RIGHT pulmonary arterial bifurcation. Acquired pressure measurements: Pre: Main PA mean 40 (normal: < 25/10) Post: Main PA mean 33 (normal: < 25/10) Post intervention pulmonary arteriograms demonstrating adequate clearance from main PA and pulmonary arterial bifurcations. IMPRESSION: 1. Pulmonary arteriogram revealing large burden bilateral pulmonary emboli with saddle morphology, and obstructing the LEFT inferior lobar branch and RIGHT pulmonary arterial bifurcation. 2. Elevated pressure within the main PA compatible with critical pulmonary arterial hypertension. 3. Successful catheter-directed bilateral pulmonary thrombectomy with adequate clearance from the main PA and pulmonary arterial bifurcations. PLAN: Continue systemic anticoagulation with heparin gtt. Management per pulmonary and critical care team. Consider bilateral lower extremities Doppler US to evaluate for DVT. Procedural results were called by telephone at the time of completion to provider Melody Comas, MD (PCCM) who verbally acknowledged these results. Roanna Banning, MD [facility] Radiology Electronically Signed   By: Roanna Banning M.D.   On: 11/22/2021 14:50   ECHOCARDIOGRAM COMPLETE  Result Date: 11/21/2021    ECHOCARDIOGRAM REPORT   Patient Name:   EMMY KENG [facility] Date of Exam: 11/21/2021 Medical Rec #:  [id number]      Height:       56.0 in Accession #:    [id number]     Weight:       157.4 lb Date of Birth:  04/24/1945      BSA:          1.603 m Patient Age:    76 years       BP:           87/30 mmHg  Patient Gender: F              HR:           101 bpm. Exam Location:  Inpatient Procedure: 2D Echo, Color Doppler and Cardiac Doppler Indications:    Pulmonary embolism  History:        Patient has no prior history of Echocardiogram examinations.  CAD; Risk Factors:Diabetes, Hypertension and Sleep Apnea.  Sonographer:    Jefferey Pica Referring Phys: [id number] Biehle  1. Left ventricular ejection fraction, by estimation, is 60 to 65%. The left ventricle has normal function. The left ventricle has no regional wall motion abnormalities. There is mild left ventricular hypertrophy. Left ventricular diastolic parameters are indeterminate. There is the interventricular septum is flattened in diastole ('D' shaped left ventricle), consistent with right ventricular volume overload.  2. McConnell's Sign. Right ventricular systolic function is moderately reduced. The right ventricular size is moderately enlarged. There is severely elevated pulmonary artery systolic pressure. The estimated right ventricular systolic pressure is 45.4 mmHg.  3. The mitral valve is normal in structure. No evidence of mitral valve regurgitation.  4. Tricuspid valve regurgitation is mild to moderate.  5. There is mild calcification of the aortic valve. Aortic valve regurgitation is not visualized.  6. The inferior vena cava is dilated in size with <50% respiratory variability, suggesting right atrial pressure of 15 mmHg. Conclusion(s)/Recommendation(s): Evidence of RV strain with diagnosis of PE. FINDINGS  Left Ventricle: Left ventricular ejection fraction, by estimation, is 60 to 65%. The left ventricle has normal function. The left ventricle has no regional wall motion abnormalities. The left ventricular internal cavity size was normal in size. There is  mild left ventricular hypertrophy. The interventricular septum is flattened in diastole ('D' shaped left ventricle), consistent with right ventricular volume  overload. Left ventricular diastolic parameters are indeterminate. Right Ventricle: McConnell's Sign. The right ventricular size is moderately enlarged. No increase in right ventricular wall thickness. Right ventricular systolic function is moderately reduced. There is severely elevated pulmonary artery systolic pressure. The tricuspid regurgitant velocity is 3.52 m/s, and with an assumed right atrial pressure of 15 mmHg, the estimated right ventricular systolic pressure is 09.8 mmHg. Left Atrium: Left atrial size was normal in size. Right Atrium: Right atrial size was normal in size. Pericardium: There is no evidence of pericardial effusion. Presence of epicardial fat layer. Mitral Valve: The mitral valve is normal in structure. No evidence of mitral valve regurgitation. Tricuspid Valve: The tricuspid valve is normal in structure. Tricuspid valve regurgitation is mild to moderate. Aortic Valve: There is mild calcification of the aortic valve. Aortic valve regurgitation is not visualized. Aortic valve peak gradient measures 3.7 mmHg. Pulmonic Valve: Pulmonic valve regurgitation is not visualized. Aorta: The aortic root and ascending aorta are structurally normal, with no evidence of dilitation. Venous: The inferior vena cava is dilated in size with less than 50% respiratory variability, suggesting right atrial pressure of 15 mmHg. IAS/Shunts: No atrial level shunt detected by color flow Doppler.  LEFT VENTRICLE PLAX 2D LVIDd:         3.20 cm LVIDs:         2.60 cm LV PW:         1.30 cm LV IVS:        1.30 cm LVOT diam:     1.80 cm LV SV:         28 LV SV Index:   17 LVOT Area:     2.54 cm  RIGHT VENTRICLE            IVC RV Basal diam:  2.90 cm    IVC diam: 2.60 cm RV Mid diam:    4.10 cm RV S prime:     8.05 cm/s LEFT ATRIUM           Index  RIGHT ATRIUM           Index LA diam:      3.70 cm 2.31 cm/m   RA Area:     16.00 cm LA Vol (A2C): 47.3 ml 29.50 ml/m  RA Volume:   37.70 ml  23.51 ml/m LA Vol  (A4C): 46.0 ml 28.69 ml/m  AORTIC VALVE                 PULMONIC VALVE AV Area (Vmax): 1.94 cm     PV Vmax:       0.66 m/s AV Vmax:        95.72 cm/s   PV Peak grad:  1.7 mmHg AV Peak Grad:   3.7 mmHg LVOT Vmax:      73.10 cm/s LVOT Vmean:     41.050 cm/s LVOT VTI:       0.108 m  AORTA Ao Root diam: 3.00 cm Ao Asc diam:  3.20 cm MITRAL VALVE               TRICUSPID VALVE MV Area (PHT): 4.05 cm    TR Peak grad:   49.6 mmHg MV Decel Time: 187 msec    TR Vmax:        352.00 cm/s MV E velocity: 79.36 cm/s MV A velocity: 63.40 cm/s  SHUNTS MV E/A ratio:  1.25        Systemic VTI:  0.11 m                            Systemic Diam: 1.80 cm Phineas Inches Electronically signed by Phineas Inches Signature Date/Time: 11/21/2021/11:13:15 AM    Final    CT Angio Chest Pulmonary Embolism (PE) W or WO Contrast  Result Date: 11/21/2021 CLINICAL DATA:  Pulmonary embolism on CTA neck of earlier in the day. EXAM: CT ANGIOGRAPHY CHEST WITH CONTRAST TECHNIQUE: Multidetector CT imaging of the chest was performed using the standard protocol during bolus administration of intravenous contrast. Multiplanar CT image reconstructions and MIPs were obtained to evaluate the vascular anatomy. RADIATION DOSE REDUCTION: This exam was performed according to the departmental dose-optimization program which includes automated exposure control, adjustment of the mA and/or kV according to patient size and/or use of iterative reconstruction technique. CONTRAST:  49mL OMNIPAQUE IOHEXOL 350 MG/ML SOLN COMPARISON:  Plain film of earlier today. CTA head of 11/22/2018 lung cancer screening CT of 04/23/2021 from [facility]. FINDINGS: Cardiovascular: Saddle embolism again identified, with clot continuing into left-greater-than-right lobar pulmonary artery branches. Example 56/5 and 67/5. The RV/LV ratio is increased, on the order of 4.6/2.1. Aortic atherosclerosis. Mild cardiomegaly with trace pericardial fluid. Multivessel coronary artery atherosclerosis.  Pulmonary artery enlargement, outflow tract 3.3 cm Mediastinum/Nodes: No mediastinal or hilar adenopathy. Lungs/Pleura: No pleural fluid. Mild centrilobular and paraseptal emphysema. Upper Abdomen: Normal imaged portions of the liver, spleen stomach, adrenal glands. Possible upper pole left renal collecting system punctate calculi. Trace perihepatic ascites Musculoskeletal: Dorsal spinal stimulator. Advanced thoracic spondylosis. Review of the MIP images confirms the above findings. IMPRESSION: 1. Redemonstration of saddle pulmonary embolus. Positive for acute PE with CT evidence of right heart strain (RV/LV Ratio = 2.2) consistent with at least submassive (intermediate risk) PE. The presence of right heart strain has been associated with an increased risk of morbidity and mortality. Please refer to the "Code PE Focused" order set in EPIC. 2. Aortic atherosclerosis (ICD10-I70.0), coronary artery atherosclerosis and emphysema (ICD10-J43.9). Electronically Signed   By: Marylyn Ishihara  Jobe Igo M.D.   On: 11/21/2021 10:35   DG Chest Port 1 View  Result Date: 11/21/2021 CLINICAL DATA:  76 year old female with saddle embolus identified on CTA for code stroke this morning. EXAM: PORTABLE CHEST 1 VIEW COMPARISON:  CTA head and neck 0731 hours. Prior chest radiographs 01/26/2018. FINDINGS: Portable AP semi upright view at 0836 hours. The patient is rotated to the right. Mediastinal contours not significantly changed since 2019. Allowing for portable technique the lungs are clear. No pneumothorax or pleural effusion. Visualized tracheal air column is within normal limits. Partially visible lumbar fusion hardware. Thoracic spinal stimulator device is new since 2019. No acute osseous abnormality identified. Paucity of bowel gas. IMPRESSION: Lungs remain clear despite Saddle Embolus demonstrated on CTA Neck this morning. Electronically Signed   By: Genevie Ann M.D.   On: 11/21/2021 08:52   CT ANGIO HEAD NECK W WO CM (CODE  STROKE)  Result Date: 11/21/2021 CLINICAL DATA:  Code stroke.  76 year old female EXAM: CT ANGIOGRAPHY HEAD AND NECK TECHNIQUE: Multidetector CT imaging of the head and neck was performed using the standard protocol during bolus administration of intravenous contrast. Multiplanar CT image reconstructions and MIPs were obtained to evaluate the vascular anatomy. Carotid stenosis measurements (when applicable) are obtained utilizing NASCET criteria, using the distal internal carotid diameter as the denominator. RADIATION DOSE REDUCTION: This exam was performed according to the departmental dose-optimization program which includes automated exposure control, adjustment of the mA and/or kV according to patient size and/or use of iterative reconstruction technique. CONTRAST:  21mL OMNIPAQUE IOHEXOL 350 MG/ML SOLN COMPARISON:  Plain head CT 0722 hours today. [facility] CT cervical spine 12/20/2020. FINDINGS: CTA NECK Skeleton: Absent dentition. Widespread cervical spine degeneration. Upper thoracic disc and endplate degeneration. No acute osseous abnormality identified. Upper chest: Acute pulmonary embolus with SADDLE EMBOLUS VISIBLE. Bilateral lobar clot. Some underlying emphysema, but visible lung parenchyma is clear. No pleural effusion. Partially visible material in the left mainstem bronchus. No superior mediastinal lymphadenopathy. Other neck: Chronic polypoid opacity left nasopharynx series 19, image 63 not significantly changed from last year. No superimposed cervical lymphadenopathy identified. No other neck mass. Aortic arch: 3 vessel arch configuration. Mildly tortuous aortic arch. Mild to moderate plaque in the distal arch and proximal descending thoracic aorta. Right carotid system: Brachiocephalic soft and calcified plaque without stenosis. Negative right CCA. Calcified plaque at the medial left ICA origin and bulb but less than 50% stenosis with respect to the distal vessel. Tortuous right ICA at  C1. Left carotid system: Negative left CCA. Mild left ICA calcified plaque without stenosis. Vertebral arteries: Proximal right subclavian soft and calcified plaque without stenosis. Normal right vertebral artery origin. Right vertebral artery is patent without stenosis to the skull base. Soft and calcified proximal left subclavian artery plaque without stenosis. Bulky similar plaque at the left vertebral origin resulting in severe stenosis (series 12, image 125). Despite string-sign appearing stenosis the left vertebral artery remains patent, and is fairly codominant. Left vertebral remains patent to the skull base with no additional stenosis. CTA HEAD Posterior circulation: Bilateral V4 segment calcified plaque with mild stenosis. Right V4 segment appears somewhat dominant. Normal left PICA origin and dominant appearing right AICA. Patent vertebrobasilar junction and basilar artery without stenosis. SCA and PCA origins are normal. Posterior communicating arteries are diminutive or absent. Bilateral PCA branches are within normal limits. Anterior circulation: Both ICA siphons are patent with calcified plaque. Mild supraclinoid siphon stenosis on the right side. Patent carotid termini, MCA and ACA  origins. Normal anterior communicating artery and bilateral ACA branches. Left MCA M1 segment and bifurcation are patent without stenosis. Right MCA M1 segment and bifurcation are patent without stenosis. Bilateral MCA branches are within normal limits. Venous sinuses: Patent. Anatomic variants: Mildly dominant right vertebral artery. Review of the MIP images confirms the above findings Critical Value/emergent results were called by telephone at the time of interpretation on 11/21/2021 at 7:48 am to Dr. Donnetta Simpers and ED Dr. Vanita Panda, who verbally acknowledged these results. IMPRESSION: 1. Positive for Acute PE with Saddle Embolus and bilateral lobar thrombus visible in the upper chest. Critical Value/emergent  results were called by telephone at the time of interpretation on 11/21/2021 at 7:48 am to Dr. Donnetta Simpers and ED Dr. Vanita Panda, who verbally acknowledged these results. 2. Negative for ELVO. Very Severe atherosclerotic stenosis at the Left Vertebral Artery origin, but the vessel remains patent. Generally mild for age atherosclerosis elsewhere in the head and neck. Aortic Atherosclerosis (ICD10-I70.0). 3. Chronic Left nasopharynx polyp, not significantly changed from last year. Recommend follow-up with ENT for direct visualization. Electronically Signed   By: Genevie Ann M.D.   On: 11/21/2021 07:57   CT HEAD CODE STROKE WO CONTRAST  Addendum Date: 11/21/2021   ADDENDUM REPORT: 11/21/2021 07:44 ADDENDUM: Study discussed by telephone with Dr. Donnetta Simpers on 11/21/2021 at 0739 hours. Electronically Signed   By: Genevie Ann M.D.   On: 11/21/2021 07:44   Result Date: 11/21/2021 CLINICAL DATA:  Code stroke.  76 year old female EXAM: CT HEAD WITHOUT CONTRAST TECHNIQUE: Contiguous axial images were obtained from the base of the skull through the vertex without intravenous contrast. RADIATION DOSE REDUCTION: This exam was performed according to the departmental dose-optimization program which includes automated exposure control, adjustment of the mA and/or kV according to patient size and/or use of iterative reconstruction technique. COMPARISON:  Head CT 12/20/2020. FINDINGS: Brain: Stable cerebral volume. No midline shift, ventriculomegaly, mass effect, evidence of mass lesion, intracranial hemorrhage or evidence of cortically based acute infarction. Small area of chronic encephalomalacia in the left occipital pole is stable from last year. Elsewhere patchy bilateral white matter hypodensity appears fairly stable and symmetric. Deep gray nuclei, brainstem and cerebellum appear negative. Vascular: Calcified atherosclerosis at the skull base. No suspicious intracranial vascular hyperdensity. Skull: Clivus appears to  remain intact. No acute osseous abnormality identified. Sinuses/Orbits: Visualized paranasal sinuses and mastoids are stable and well aerated. Other: No acute orbit or scalp soft tissue finding. Lobulated chronic polypoid lesion in the posterior left nasal cavity appears stable from last year on series 4, image 6. ASPECTS [facility] Stroke Program Early CT Score) Total score (0-10 with 10 being normal): Ten IMPRESSION: 1. No acute cortically based infarct or acute intracranial hemorrhage identified. ASPECTS 10. 2. Small chronic Left PCA territory infarct. Mild for age bilateral white matter changes. 3. Chronic left nasal cavity or nasopharynx polypoid soft tissue lesion, not significantly changed from last year favoring benign etiology. Recommend follow-up with ENT for direct visualization. Electronically Signed: By: Genevie Ann M.D. On: 11/21/2021 07:31     TODAY-DAY OF DISCHARGE:  Subjective:   Tyia Binford today has no headache,no chest abdominal pain,no new weakness tingling or numbness, feels much better wants to go home today.  Objective:   Blood pressure (!) 115/92, pulse 79, temperature 98.3 F (36.8 C), temperature source Axillary, resp. rate (!) 23, height $RemoveBe'4\' 8"'najiFSzdG$  (1.422 m), weight 76.8 kg, SpO2 97 %.  Intake/Output Summary (Last 24 hours) at 11/24/2021 5035 Last data filed  at 11/23/2021 2030 Gross per 24 hour  Intake 480 ml  Output 230 ml  Net 250 ml   Filed Weights   11/22/21 0500 11/23/21 0431 11/24/21 0555  Weight: 73.8 kg 75.6 kg 76.8 kg    Exam: Awake Alert, Oriented *3, No new F.N deficits, Normal affect [place].AT,PERRAL Supple Neck,No JVD, No cervical lymphadenopathy appriciated.  Symmetrical Chest wall movement, Good air movement bilaterally, CTAB RRR,No Gallops,Rubs or new Murmurs, No Parasternal Heave +ve B.Sounds, Abd Soft, Non tender, No organomegaly appriciated, No rebound -guarding or rigidity. No Cyanosis, Clubbing or edema, No new Rash or bruise   PERTINENT RADIOLOGIC  STUDIES: VAS [place] LOWER EXTREMITY VENOUS (DVT)  Result Date: 11/23/2021  Lower Venous DVT Study Patient Name:  MARGARITA CROKE [facility]  Date of Exam:   11/23/2021 Medical Rec #: [id number]       Accession #:    [id number] Date of Birth: 03/30/1946       Patient Gender: F Patient Age:   7 years Exam Location:  [facility] Procedure:      VAS [place] LOWER EXTREMITY VENOUS (DVT) Referring Phys: Oren Binet --------------------------------------------------------------------------------  Indications: Swelling, and pulmonary embolism.  Risk Factors: Confirmed PE. Limitations: Poor ultrasound/tissue interface. Comparison Study: No prior studies. Performing Technologist: Oliver Hum RVT  Examination Guidelines: A complete evaluation includes B-mode imaging, spectral Doppler, color Doppler, and power Doppler as needed of all accessible portions of each vessel. Bilateral testing is considered an integral part of a complete examination. Limited examinations for reoccurring indications may be performed as noted. The reflux portion of the exam is performed with the patient in reverse Trendelenburg.  +---------+---------------+---------+-----------+----------+--------------+ RIGHT    CompressibilityPhasicitySpontaneityPropertiesThrombus Aging +---------+---------------+---------+-----------+----------+--------------+ CFV      Full           Yes      Yes                                 +---------+---------------+---------+-----------+----------+--------------+ SFJ      Full                                                        +---------+---------------+---------+-----------+----------+--------------+ FV Prox  Full                                                        +---------+---------------+---------+-----------+----------+--------------+ FV Mid   Full                                                        +---------+---------------+---------+-----------+----------+--------------+ FV  DistalFull                                                        +---------+---------------+---------+-----------+----------+--------------+ PFV      Full                                                        +---------+---------------+---------+-----------+----------+--------------+  POP      Full           Yes      Yes                                 +---------+---------------+---------+-----------+----------+--------------+ PTV      Full                                                        +---------+---------------+---------+-----------+----------+--------------+ PERO     Full                                                        +---------+---------------+---------+-----------+----------+--------------+   +---------+---------------+---------+-----------+----------+--------------+ LEFT     CompressibilityPhasicitySpontaneityPropertiesThrombus Aging +---------+---------------+---------+-----------+----------+--------------+ CFV      Full           Yes      Yes                                 +---------+---------------+---------+-----------+----------+--------------+ SFJ      Full                                                        +---------+---------------+---------+-----------+----------+--------------+ FV Prox  Full                                                        +---------+---------------+---------+-----------+----------+--------------+ FV Mid   Full                                                        +---------+---------------+---------+-----------+----------+--------------+ FV DistalFull                                                        +---------+---------------+---------+-----------+----------+--------------+ PFV      Full                                                        +---------+---------------+---------+-----------+----------+--------------+ POP      Full           Yes      Yes                                  +---------+---------------+---------+-----------+----------+--------------+  PTV      None                                         Acute          +---------+---------------+---------+-----------+----------+--------------+ PERO     Full                                                        +---------+---------------+---------+-----------+----------+--------------+     Summary: RIGHT: - There is no evidence of deep vein thrombosis in the lower extremity.  - No cystic structure found in the popliteal fossa.  LEFT: - Findings consistent with acute deep vein thrombosis involving one of the paired left posterior tibial veins.  *See table(s) above for measurements and observations. Electronically signed by Jamelle Haring on 11/23/2021 at 2:58:43 PM.    Final      PERTINENT LAB RESULTS: CBC: Recent Labs    11/22/21 0450 11/22/21 1632  WBC 6.4 6.9  HGB 11.6* 11.5*  HCT 35.8* 34.9*  PLT 125* 133*   CMET CMP     Component Value Date/Time   NA 137 11/22/2021 0450   K 4.5 11/22/2021 0450   CL 112 (H) 11/22/2021 0450   CO2 18 (L) 11/22/2021 0450   GLUCOSE 132 (H) 11/22/2021 0450   BUN 21 11/22/2021 0450   CREATININE 1.30 (H) 11/22/2021 0450   CALCIUM 8.9 11/22/2021 0450   PROT 5.6 (L) 11/21/2021 0750   ALBUMIN 3.3 (L) 11/21/2021 0750   AST 25 11/21/2021 0750   ALT 16 11/21/2021 0750   ALKPHOS 54 11/21/2021 0750   BILITOT 0.7 11/21/2021 0750   GFRNONAA 43 (L) 11/22/2021 0450   GFRAA 35 (L) 07/04/2019 1150    GFR Estimated Creatinine Clearance: 31 mL/min (A) (by C-G formula based on SCr of 1.3 mg/dL (H)). Recent Labs    11/21/21 0955  LIPASE 46  AMYLASE 57   No results for input(s): "CKTOTAL", "CKMB", "CKMBINDEX", "TROPONINI" in the last 72 hours. Invalid input(s): "POCBNP" No results for input(s): "DDIMER" in the last 72 hours. No results for input(s): "HGBA1C" in the last 72 hours. No results for input(s): "CHOL", "HDL", "LDLCALC", "TRIG",  "CHOLHDL", "LDLDIRECT" in the last 72 hours. No results for input(s): "TSH", "T4TOTAL", "T3FREE", "THYROIDAB" in the last 72 hours.  Invalid input(s): "FREET3" No results for input(s): "VITAMINB12", "FOLATE", "FERRITIN", "TIBC", "IRON", "RETICCTPCT" in the last 72 hours. Coags: Recent Labs    11/21/21 1502  INR 1.3*   Microbiology: Recent Results (from the past 240 hour(s))  Resp Panel by RT-PCR (Flu A&B, Covid) Anterior Nasal Swab     Status: None   Collection Time: 11/21/21  7:22 AM   Specimen: Anterior Nasal Swab  Result Value Ref Range Status   SARS Coronavirus 2 by RT PCR NEGATIVE NEGATIVE Final    Comment: (NOTE) SARS-CoV-2 target nucleic acids are NOT DETECTED.  The SARS-CoV-2 RNA is generally detectable in upper respiratory specimens during the acute phase of infection. The lowest concentration of SARS-CoV-2 viral copies this assay can detect is 138 copies/mL. A negative result does not preclude SARS-Cov-2 infection and should not be used as the sole basis for treatment or other patient management decisions. A negative result may occur with  improper specimen collection/handling, submission of specimen other than nasopharyngeal swab, presence of viral mutation(s) within the areas targeted by this assay, and inadequate number of viral copies(<138 copies/mL). A negative result must be combined with clinical observations, patient history, and epidemiological information. The expected result is Negative.  Fact Sheet for Patients:  EntrepreneurPulse.com.au  Fact Sheet for Healthcare Providers:  IncredibleEmployment.be  This test is no t yet approved or cleared by the [place] FDA and  has been authorized for detection and/or diagnosis of SARS-CoV-2 by FDA under an Emergency Use Authorization (EUA). This EUA will remain  in effect (meaning this test can be used) for the duration of the COVID-19 declaration under Section 564(b)(1)  of the Act, 21 U.S.C.section 360bbb-3(b)(1), unless the authorization is terminated  or revoked sooner.       Influenza A by PCR NEGATIVE NEGATIVE Final   Influenza B by PCR NEGATIVE NEGATIVE Final    Comment: (NOTE) The Xpert Xpress SARS-CoV-2/FLU/RSV plus assay is intended as an aid in the diagnosis of influenza from Nasopharyngeal swab specimens and should not be used as a sole basis for treatment. Nasal washings and aspirates are unacceptable for Xpert Xpress SARS-CoV-2/FLU/RSV testing.  Fact Sheet for Patients: EntrepreneurPulse.com.au  Fact Sheet for Healthcare Providers: IncredibleEmployment.be  This test is not yet approved or cleared by the [place] FDA and has been authorized for detection and/or diagnosis of SARS-CoV-2 by FDA under an Emergency Use Authorization (EUA). This EUA will remain in effect (meaning this test can be used) for the duration of the COVID-19 declaration under Section 564(b)(1) of the Act, 21 U.S.C. section 360bbb-3(b)(1), unless the authorization is terminated or revoked.  Performed at [facility], [place] [address], [place], [place] [address]   Culture, blood (Routine x 2)     Status: None (Preliminary result)   Collection Time: 11/21/21  7:52 AM   Specimen: BLOOD RIGHT HAND  Result Value Ref Range Status   Specimen Description BLOOD RIGHT HAND  Final   Special Requests   Final    BOTTLES DRAWN AEROBIC ONLY Blood Culture results may not be optimal due to an inadequate volume of blood received in culture bottles   Culture   Final    NO GROWTH 3 DAYS Performed at [facility], [place] [address], [place], [place] [address]    Report Status PENDING  Incomplete  Culture, blood (Routine X 2) w Reflex to ID Panel     Status: None (Preliminary result)   Collection Time: 11/21/21  3:01 PM   Specimen: BLOOD  Result Value Ref Range Status   Specimen Description BLOOD BLOOD RIGHT HAND  Final    Special Requests   Final    BOTTLES DRAWN AEROBIC AND ANAEROBIC Blood Culture adequate volume   Culture   Final    NO GROWTH 3 DAYS Performed at [facility], [place] [address], [place], [place] [address]    Report Status PENDING  Incomplete  Culture, blood (Routine X 2) w Reflex to ID Panel     Status: None (Preliminary result)   Collection Time: 11/21/21  3:08 PM   Specimen: BLOOD  Result Value Ref Range Status   Specimen Description BLOOD BLOOD RIGHT FOREARM  Final   Special Requests   Final    BOTTLES DRAWN AEROBIC AND ANAEROBIC Blood Culture adequate volume   Culture   Final    NO GROWTH 3 DAYS Performed at [facility], [place],  [place] [address]    Report Status PENDING  Incomplete    FURTHER DISCHARGE INSTRUCTIONS:  Get Medicines reviewed and adjusted: Please take all your medications with you for your next visit with your Primary MD  Laboratory/radiological data: Please request your Primary MD to go over all hospital tests and procedure/radiological results at the follow up, please ask your Primary MD to get all Hospital records sent to his/her office.  In some cases, they will be blood work, cultures and biopsy results pending at the time of your discharge. Please request that your primary care M.D. goes through all the records of your hospital data and follows up on these results.  Also Note the following: If you experience worsening of your admission symptoms, develop shortness of breath, life threatening emergency, suicidal or homicidal thoughts you must seek medical attention immediately by calling 911 or calling your MD immediately  if symptoms less severe.  You must read complete instructions/literature along with all the possible adverse reactions/side effects for all the Medicines you take and that have been prescribed to you. Take any new Medicines after you have completely understood and accpet all the possible adverse reactions/side  effects.   Do not drive when taking Pain medications or sleeping medications (Benzodaizepines)  Do not take more than prescribed Pain, Sleep and Anxiety Medications. It is not advisable to combine anxiety,sleep and pain medications without talking with your primary care practitioner  Special Instructions: If you have smoked or chewed Tobacco  in the last 2 yrs please stop smoking, stop any regular Alcohol  and or any Recreational drug use.  Wear Seat belts while driving.  Please note: You were cared for by a hospitalist during your hospital stay. Once you are discharged, your primary care physician will handle any further medical issues. Please note that NO REFILLS for any discharge medications will be authorized once you are discharged, as it is imperative that you return to your primary care physician (or establish a relationship with a primary care physician if you do not have one) for your post hospital discharge needs so that they can reassess your need for medications and monitor your lab values.  Total Time spent coordinating discharge including counseling, education and face to face time equals greater than 30 minutes.  SignedOren Binet 11/24/2021 9:11 AM

## 2021-11-25 ENCOUNTER — Encounter: Payer: Self-pay | Admitting: Cardiology

## 2021-11-25 NOTE — Telephone Encounter (Signed)
error 

## 2021-11-26 LAB — CULTURE, BLOOD (ROUTINE X 2)
Culture: NO GROWTH
Culture: NO GROWTH
Culture: NO GROWTH
Special Requests: ADEQUATE
Special Requests: ADEQUATE

## 2021-11-26 NOTE — Anesthesia Postprocedure Evaluation (Signed)
Anesthesia Post Note  Patient: Lisa Maxwell  Procedure(s) Performed: IR WITH ANESTHESIA     Patient location during evaluation: ICU Anesthesia Type: MAC Level of consciousness: awake and patient cooperative Pain management: pain level controlled Vital Signs Assessment: post-procedure vital signs reviewed and stable Respiratory status: spontaneous breathing Cardiovascular status: stable Anesthetic complications: no   No notable events documented.          Tyliah Schlereth

## 2021-11-28 ENCOUNTER — Encounter: Payer: Self-pay | Admitting: Cardiology

## 2021-11-28 ENCOUNTER — Ambulatory Visit: Payer: Medicare HMO | Admitting: Cardiology

## 2021-11-28 VITALS — BP 118/70 | HR 70 | Resp 18 | Ht <= 58 in | Wt 168.8 lb

## 2021-11-28 DIAGNOSIS — Z86711 Personal history of pulmonary embolism: Secondary | ICD-10-CM

## 2021-11-28 DIAGNOSIS — N183 Chronic kidney disease, stage 3 unspecified: Secondary | ICD-10-CM

## 2021-11-28 DIAGNOSIS — I10 Essential (primary) hypertension: Secondary | ICD-10-CM

## 2021-11-28 DIAGNOSIS — I2510 Atherosclerotic heart disease of native coronary artery without angina pectoris: Secondary | ICD-10-CM

## 2021-11-28 DIAGNOSIS — I480 Paroxysmal atrial fibrillation: Secondary | ICD-10-CM | POA: Diagnosis not present

## 2021-11-28 DIAGNOSIS — E088 Diabetes mellitus due to underlying condition with unspecified complications: Secondary | ICD-10-CM

## 2021-11-28 HISTORY — DX: Personal history of pulmonary embolism: Z86.711

## 2021-11-28 MED ORDER — APIXABAN 5 MG PO TABS: 5.0000 mg | ORAL_TABLET | Freq: Two times a day (BID) | ORAL | 12 refills | Status: DC

## 2021-11-28 MED ORDER — AMOXICILLIN-POT CLAVULANATE 875-125 MG PO TABS: 1.0000 | ORAL_TABLET | Freq: Two times a day (BID) | ORAL | 0 refills | Status: DC

## 2021-11-28 NOTE — Patient Instructions (Signed)
Medication Instructions:  Your physician has recommended you make the following change in your medication:   Start Augmentin 875 mg   *If you need a refill on your cardiac medications before your next appointment, please call your pharmacy*   Lab Work: None ordered If you have labs (blood work) drawn today and your tests are completely normal, you will receive your results only by: [place] (if you have MyChart) OR A paper copy in the mail If you have any lab test that is abnormal or we need to change your treatment, we will call you to review the results.   Testing/Procedures: None ordered   Follow-Up: At [facility], you and your health needs are our priority.  As part of our continuing mission to provide you with exceptional heart care, we have created designated Provider Care Teams.  These Care Teams include your primary Cardiologist (physician) and Advanced Practice Providers (APPs -  Physician Assistants and Nurse Practitioners) who all work together to provide you with the care you need, when you need it.  We recommend signing up for the patient portal called "MyChart".  Sign up information is provided on this After Visit Summary.  MyChart is used to connect with patients for Virtual Visits (Telemedicine).  Patients are able to view lab/test results, encounter notes, upcoming appointments, etc.  Non-urgent messages can be sent to your provider as well.   To learn more about what you can do with MyChart, go to [email or web address].    Your next appointment:   6 month(s)  The format for your next appointment:   In Person  Provider:   Jyl Heinz, MD   Other Instructions NA

## 2021-11-28 NOTE — Progress Notes (Signed)
Cardiology Office Note:    Date:  11/28/2021   ID:  Lisa, Maxwell 12-04-1945, MRN [id number]  PCP:  Nicoletta Dress, MD  Cardiologist:  Jenean Lindau, MD   Referring MD: Nicoletta Dress, MD    ASSESSMENT:    1. Coronary artery disease involving native coronary artery of native heart without angina pectoris   2. Essential hypertension   3. PAF (paroxysmal atrial fibrillation) (HCC)   4. Stage 3 chronic kidney disease, unspecified whether stage 3a or 3b CKD ([place])   5. Diabetes mellitus due to underlying condition with unspecified complications ([place])   6. History of pulmonary embolism    PLAN:    In order of problems listed above:  Coronary artery disease: Secondary prevention stressed with the patient.  Importance of compliance with diet medication stressed and she vocalized understanding. Patient has an area on the right elbow region where her IVs were initiated on a couple of occasions.  This has evidence of some inflammation and mild tenderness.  She is concerned about it.  I initiated her course of antibiotic which is Augmentin.  She will see her primary care next week and follow-up with them about this issue. Atrial fibrillation:I discussed with the patient atrial fibrillation, disease process. Management and therapy including rate and rhythm control, anticoagulation benefits and potential risks were discussed extensively with the patient. Patient had multiple questions which were answered to patient's satisfaction. Saddle pulmonary embolus and DVT: Now on anticoagulation. Mixed dyslipidemia, diabetes mellitus and obesity: Diet was suggested lifestyle modification urged and she promises to do better. Patient will be seen in follow-up appointment in 6 months or earlier if the patient has any concerns    Medication Adjustments/Labs and Tests Ordered: Current medicines are reviewed at length with the patient today.  Concerns regarding medicines are outlined above.   No orders of the defined types were placed in this encounter.  No orders of the defined types were placed in this encounter.    Chief Complaint  Patient presents with   [place] from [facility] after PE.     History of Present Illness:    Lisa Maxwell is a 76 y.o. female.  Patient has past medical history of coronary artery disease, essential hypertension, dyslipidemia, diabetes mellitus, renal insufficiency, morbid obesity and recently had DVT and saddle pulmonary embolus.  She underwent treatment for this and subsequently has done well.  Her anticoagulation was withheld for a period of time because of fall potential.  During that time she has had a significant fall.  Subsequently because of these 2 significant indications which is saddle pulmonary embolus and atrial fibrillation she is back on anticoagulation.  She understands volvulus.  Her husband is very supportive.  At the time of my evaluation, the patient is alert awake oriented and in no distress.  Past Medical History:  Diagnosis Date   CAD (coronary artery disease) 11/04/2016   Cancer ([place])    skin cancer   Cerebrovascular disease    Chronic atrial fibrillation ([place]) 09/28/2018   Chronic bilateral low back pain without sciatica 03/08/2018   Chronic kidney disease    Chronic obstructive lung disease ([place]) 09/28/2018   Chronic pain syndrome 03/08/2018   Chronic renal failure 09/28/2018   Chronic, continuous use of opioids 03/04/2021   Last Assessment & Plan:  Formatting of this note might be different from the original. Patient and I have discussed the hazardous effects of continued opiate pain  medication usage. Risks and benefits of above medications including but not limited to possibility of respiratory depression, sedation, and even death were discussed with the patient who expressed an understanding.  Patient did not displ   CKD (chronic kidney disease) stage 3, GFR 30-59 ml/min (HCC)    Comprehensive  diabetic foot examination, type 2 DM, encounter for ([place]) 04/29/2021   DDD (degenerative disc disease), lumbar 11/17/2018   Last Assessment & Plan:  Formatting of this note might be different from the original. See spinal stenosis plan   Diabetes ([place])    Diabetes mellitus due to underlying condition with unspecified complications ([place]) 42/35/3614   Dyshidrotic eczema 04/29/2021   Dysrhythmia    afib   Essential hypertension 11/04/2016   Facet arthritis of lumbar region 03/08/2018   Facet joint disease 11/17/2018   Last Assessment & Plan:  Formatting of this note might be different from the original. See spinal stenosis plan   Foraminal stenosis of lumbar region 03/08/2018   Heart murmur    History of knee replacement, total, bilateral 11/17/2018   Last Assessment & Plan:  Formatting of this note might be different from the original. Have recommended to her several times if any further concerns in regards to the pain status post arthroplasty, she is to follow up with her surgeon.   Hypertension    Hypertensive disorder 09/28/2018   Increased body mass index 09/28/2018   Lumbar radiculopathy 03/08/2018   Lumbar spondylosis 03/20/2019   Mixed dyslipidemia 11/04/2016   Morbid obesity ([place]) 11/04/2016   Muscle pain 04/29/2020   Osteoarthritis of left knee    PAF (paroxysmal atrial fibrillation) ([place]) 04/30/2015   Pain management contract agreement 04/13/2019   Last Assessment & Plan:  Formatting of this note might be different from the original. Contract updated today.  UDS completed.  [place] controlled substance registry reviewed and is consistent with her regimen.   Peripheral vascular disease ([place]) 09/28/2018   Preop cardiovascular exam 05/01/2019   Pulmonary emboli [facility])    Renal insufficiency 04/30/2015   S/P insertion of spinal cord stimulator 07/13/2019   Last Assessment & Plan:  Formatting of this note might be different from the original. About 5 months status post  insertion of spinal cord stimulator with excellent relief.  She continues to keep close follow-up with her representative and has been working to appropriately find best programs to get her adequate pain relief.   Sleep apnea    Smoker 09/28/2018   Spinal stenosis of lumbar region 03/20/2019   Spinal stenosis of lumbar region with neurogenic claudication 03/08/2018   Last Assessment & Plan:  Formatting of this note is different from the original. 76 year old female with chronic low back pain and bilateral, right greater than left, L4-5 radicular pain, and claudication. She has substantial amount of multifactorial degenerative thoracic and lumbar spine pathology.  After being deemed an inappropriate open surgical candidate, and failing to respond to multiple in   Spondylolisthesis 03/20/2019   Spondylosis of lumbosacral region without myelopathy or radiculopathy 01/30/2019   Last Assessment & Plan:  Formatting of this note might be different from the original. See spinal stenosis plan   Type 2 diabetes mellitus ([place]) 09/28/2018    Past Surgical History:  Procedure Laterality Date   ABDOMINAL HYSTERECTOMY     BACK SURGERY     EYE SURGERY     bilateral cataracts   IR ANGIOGRAM PULMONARY BILATERAL SELECTIVE  11/21/2021   IR ANGIOGRAM SELECTIVE EACH ADDITIONAL VESSEL  11/21/2021  IR ANGIOGRAM SELECTIVE EACH ADDITIONAL VESSEL  11/21/2021   IR THROMBECT PRIM MECH INIT (INCLU) MOD SED  11/21/2021   IR US GUIDE VASC ACCESS RIGHT  11/21/2021   JOINT REPLACEMENT Bilateral    knees   RADIOLOGY WITH ANESTHESIA N/A 11/21/2021   Procedure: IR WITH ANESTHESIA;  Surgeon: Radiologist, Medication, MD;  Location: [place];  Service: Radiology;  Laterality: N/A;   SPINAL CORD STIMULATOR INSERTION N/A 07/07/2019   Procedure: LUMBAR SPINAL CORD STIMULATOR INSERTION;  Surgeon: Clydell Hakim, MD;  Location: [place];  Service: Neurosurgery;  Laterality: N/A;   total left knee      Current Medications: No outpatient  medications have been marked as taking for the 11/28/21 encounter (Office Visit) with Yashica Sterbenz, Reita Cliche, MD.     Allergies:   Patient has no known allergies.   Social History   Socioeconomic History   Marital status: Married    Spouse name: Not on file   Number of children: Not on file   Years of education: Not on file   Highest education level: Not on file  Occupational History   Not on file  Tobacco Use   Smoking status: Former    Types: Cigarettes    Quit date: 05/08/2019    Years since quitting: 2.5   Smokeless tobacco: Never  Vaping Use   Vaping Use: Never used  Substance and Sexual Activity   Alcohol use: No   Drug use: No   Sexual activity: Not on file  Other Topics Concern   Not on file  Social History Narrative   Not on file   Social Determinants of Health   Financial Resource Strain: Not on file  Food Insecurity: Not on file  Transportation Needs: Not on file  Physical Activity: Not on file  Stress: Not on file  Social Connections: Not on file     Family History: The patient's family history includes Diabetes in her mother.  ROS:   Please see the history of present illness.    All other systems reviewed and are negative.  EKGs/Labs/Other Studies Reviewed:    The following studies were reviewed today: I discussed my findings with the patient at length.   Recent Labs: 11/21/2021: ALT 16; B Natriuretic Peptide 101.1 11/24/2021: BUN 18; Creatinine, Ser 1.56; Hemoglobin 12.1; Platelets 133; Potassium 3.9; Sodium 137  Recent Lipid Panel No results found for: "CHOL", "TRIG", "HDL", "CHOLHDL", "VLDL", "LDLCALC", "LDLDIRECT"  Physical Exam:    VS:  BP 118/70 (BP Location: Right Arm, Patient Position: Sitting, Cuff Size: Normal)   Pulse 70   Resp 18   Ht '4\' 8"'$  (1.422 m)   Wt 168 lb 12.8 oz (76.6 kg)   SpO2 96%   BMI 37.84 kg/m     Wt Readings from Last 3 Encounters:  11/28/21 168 lb 12.8 oz (76.6 kg)  11/24/21 169 lb 5 oz (76.8 kg)  08/28/21 153  lb (69.4 kg)     GEN: Patient is in no acute distress HEENT: Normal NECK: No JVD; No carotid bruits LYMPHATICS: No lymphadenopathy CARDIAC: Hear sounds regular, 2/6 systolic murmur at the apex. RESPIRATORY:  Clear to auscultation without rales, wheezing or rhonchi  ABDOMEN: Soft, non-tender, non-distended MUSCULOSKELETAL:  No edema; No deformity  SKIN: Warm and dry NEUROLOGIC:  Alert and oriented x 3 PSYCHIATRIC:  Normal affect   Signed, Jenean Lindau, MD  11/28/2021 9:07 AM    [place]

## 2021-12-01 DIAGNOSIS — Z981 Arthrodesis status: Secondary | ICD-10-CM | POA: Diagnosis not present

## 2021-12-01 DIAGNOSIS — M5459 Other low back pain: Secondary | ICD-10-CM | POA: Diagnosis not present

## 2021-12-01 DIAGNOSIS — M21371 Foot drop, right foot: Secondary | ICD-10-CM | POA: Diagnosis not present

## 2021-12-01 DIAGNOSIS — R2689 Other abnormalities of gait and mobility: Secondary | ICD-10-CM | POA: Diagnosis not present

## 2021-12-04 DIAGNOSIS — I1310 Hypertensive heart and chronic kidney disease without heart failure, with stage 1 through stage 4 chronic kidney disease, or unspecified chronic kidney disease: Secondary | ICD-10-CM | POA: Diagnosis not present

## 2021-12-04 DIAGNOSIS — N183 Chronic kidney disease, stage 3 unspecified: Secondary | ICD-10-CM | POA: Diagnosis not present

## 2021-12-04 DIAGNOSIS — J338 Other polyp of sinus: Secondary | ICD-10-CM | POA: Diagnosis not present

## 2021-12-04 DIAGNOSIS — R2689 Other abnormalities of gait and mobility: Secondary | ICD-10-CM | POA: Diagnosis not present

## 2021-12-04 DIAGNOSIS — D638 Anemia in other chronic diseases classified elsewhere: Secondary | ICD-10-CM | POA: Diagnosis not present

## 2021-12-04 DIAGNOSIS — Z6836 Body mass index (BMI) 36.0-36.9, adult: Secondary | ICD-10-CM | POA: Diagnosis not present

## 2021-12-04 DIAGNOSIS — J449 Chronic obstructive pulmonary disease, unspecified: Secondary | ICD-10-CM | POA: Diagnosis not present

## 2021-12-04 DIAGNOSIS — Z981 Arthrodesis status: Secondary | ICD-10-CM | POA: Diagnosis not present

## 2021-12-04 DIAGNOSIS — M5459 Other low back pain: Secondary | ICD-10-CM | POA: Diagnosis not present

## 2021-12-04 DIAGNOSIS — M21371 Foot drop, right foot: Secondary | ICD-10-CM | POA: Diagnosis not present

## 2021-12-04 DIAGNOSIS — I4891 Unspecified atrial fibrillation: Secondary | ICD-10-CM | POA: Diagnosis not present

## 2021-12-09 ENCOUNTER — Other Ambulatory Visit: Payer: Self-pay | Admitting: Family Medicine

## 2021-12-09 DIAGNOSIS — N2889 Other specified disorders of kidney and ureter: Secondary | ICD-10-CM

## 2021-12-10 DIAGNOSIS — M21371 Foot drop, right foot: Secondary | ICD-10-CM | POA: Diagnosis not present

## 2021-12-10 DIAGNOSIS — M5459 Other low back pain: Secondary | ICD-10-CM | POA: Diagnosis not present

## 2021-12-10 DIAGNOSIS — Z981 Arthrodesis status: Secondary | ICD-10-CM | POA: Diagnosis not present

## 2021-12-10 DIAGNOSIS — R2689 Other abnormalities of gait and mobility: Secondary | ICD-10-CM | POA: Diagnosis not present

## 2021-12-11 ENCOUNTER — Ambulatory Visit
Admission: RE | Admit: 2021-12-11 | Discharge: 2021-12-11 | Disposition: A | Discharge status: Home / Self Care | Payer: Medicare HMO | Source: Ambulatory Visit | Attending: Family Medicine | Admitting: Family Medicine

## 2021-12-11 DIAGNOSIS — N2889 Other specified disorders of kidney and ureter: Secondary | ICD-10-CM

## 2021-12-11 NOTE — Consult Note (Signed)
Chief Complaint: Patient was seen in consultation today for right renal malignancy  at the request of Jeanes,Sarah  Referring Physician(s): Jeanes,Sarah  History of Present Illness: Lisa Maxwell is a 76 y.o. female with history of chronic atrial fibrillation, HTN, HLD, frequent falls, chronic venous syndrome-s/p L2-L3 decompression May 2023, chronic back pain-s/p spinal cord stimulator in place, COPD, Acute PE with cardiogenic shock requiring catheter thrombectomy, and incidentally discovered enhancing right renal mass.  CT abdomen on 11/19/2021 showed a 3.1 x 3.0 x 2.2 cm solid enhancing mass at the lower pole of the right kidney, consistent with RCC.  The mass extends into the renal sinus fat.  She feels well today.  She denies any chest pain, shortness of breath, or flank pain.  Past Medical History:  Diagnosis Date   CAD (coronary artery disease) 11/04/2016   Cancer ([place])    skin cancer   Cerebrovascular disease    Chronic atrial fibrillation ([place]) 09/28/2018   Chronic bilateral low back pain without sciatica 03/08/2018   Chronic kidney disease    Chronic obstructive lung disease ([place]) 09/28/2018   Chronic pain syndrome 03/08/2018   Chronic renal failure 09/28/2018   Chronic, continuous use of opioids 03/04/2021   Last Assessment & Plan:  Formatting of this note might be different from the original. Patient and I have discussed the hazardous effects of continued opiate pain medication usage. Risks and benefits of above medications including but not limited to possibility of respiratory depression, sedation, and even death were discussed with the patient who expressed an understanding.  Patient did not displ   CKD (chronic kidney disease) stage 3, GFR 30-59 ml/min (HCC)    Comprehensive diabetic foot examination, type 2 DM, encounter for ([place]) 04/29/2021   DDD (degenerative disc disease), lumbar 11/17/2018   Last Assessment & Plan:  Formatting of this note might be  different from the original. See spinal stenosis plan   Diabetes ([place])    Diabetes mellitus due to underlying condition with unspecified complications ([place]) 07/68/0881   Dyshidrotic eczema 04/29/2021   Dysrhythmia    afib   Essential hypertension 11/04/2016   Facet arthritis of lumbar region 03/08/2018   Facet joint disease 11/17/2018   Last Assessment & Plan:  Formatting of this note might be different from the original. See spinal stenosis plan   Foraminal stenosis of lumbar region 03/08/2018   Heart murmur    History of knee replacement, total, bilateral 11/17/2018   Last Assessment & Plan:  Formatting of this note might be different from the original. Have recommended to her several times if any further concerns in regards to the pain status post arthroplasty, she is to follow up with her surgeon.   Hypertension    Hypertensive disorder 09/28/2018   Increased body mass index 09/28/2018   Lumbar radiculopathy 03/08/2018   Lumbar spondylosis 03/20/2019   Mixed dyslipidemia 11/04/2016   Morbid obesity ([place]) 11/04/2016   Muscle pain 04/29/2020   Osteoarthritis of left knee    PAF (paroxysmal atrial fibrillation) ([place]) 04/30/2015   Pain management contract agreement 04/13/2019   Last Assessment & Plan:  Formatting of this note might be different from the original. Contract updated today.  UDS completed.  [place] controlled substance registry reviewed and is consistent with her regimen.   Peripheral vascular disease ([place]) 09/28/2018   Preop cardiovascular exam 05/01/2019   Pulmonary emboli [facility])    Renal insufficiency 04/30/2015   S/P insertion of spinal cord stimulator 07/13/2019  Last Assessment & Plan:  Formatting of this note might be different from the original. About 5 months status post insertion of spinal cord stimulator with excellent relief.  She continues to keep close follow-up with her representative and has been working to appropriately find best programs to get her  adequate pain relief.   Sleep apnea    Smoker 09/28/2018   Spinal stenosis of lumbar region 03/20/2019   Spinal stenosis of lumbar region with neurogenic claudication 03/08/2018   Last Assessment & Plan:  Formatting of this note is different from the original. 76 year old female with chronic low back pain and bilateral, right greater than left, L4-5 radicular pain, and claudication. She has substantial amount of multifactorial degenerative thoracic and lumbar spine pathology.  After being deemed an inappropriate open surgical candidate, and failing to respond to multiple in   Spondylolisthesis 03/20/2019   Spondylosis of lumbosacral region without myelopathy or radiculopathy 01/30/2019   Last Assessment & Plan:  Formatting of this note might be different from the original. See spinal stenosis plan   Type 2 diabetes mellitus ([place]) 09/28/2018    Past Surgical History:  Procedure Laterality Date   ABDOMINAL HYSTERECTOMY     BACK SURGERY     EYE SURGERY     bilateral cataracts   IR ANGIOGRAM PULMONARY BILATERAL SELECTIVE  11/21/2021   IR ANGIOGRAM SELECTIVE EACH ADDITIONAL VESSEL  11/21/2021   IR ANGIOGRAM SELECTIVE EACH ADDITIONAL VESSEL  11/21/2021   IR THROMBECT PRIM MECH INIT (INCLU) MOD SED  11/21/2021   IR US GUIDE VASC ACCESS RIGHT  11/21/2021   JOINT REPLACEMENT Bilateral    knees   RADIOLOGY WITH ANESTHESIA N/A 11/21/2021   Procedure: IR WITH ANESTHESIA;  Surgeon: Radiologist, Medication, MD;  Location: [place];  Service: Radiology;  Laterality: N/A;   SPINAL CORD STIMULATOR INSERTION N/A 07/07/2019   Procedure: LUMBAR SPINAL CORD STIMULATOR INSERTION;  Surgeon: Clydell Hakim, MD;  Location: [place];  Service: Neurosurgery;  Laterality: N/A;   total left knee      Allergies: Patient has no known allergies.  Medications: Prior to Admission medications   Medication Sig Start Date End Date Taking? Authorizing Provider  amoxicillin-clavulanate (AUGMENTIN) 875-125 MG tablet Take 1 tablet  by mouth 2 (two) times daily. 11/28/21   Revankar, Reita Cliche, MD  apixaban (ELIQUIS) 5 MG TABS tablet Take 1 tablet (5 mg total) by mouth 2 (two) times daily. 11/28/21   Revankar, Reita Cliche, MD  Coenzyme Q10 (CO Q-10) 200 MG CAPS Take 200 mg by mouth in the morning.    [provider]  Cyanocobalamin (VITAMIN B-12) 500 MCG SUBL Place 500 mcg under the tongue in the morning.    [provider]  diphenhydrAMINE (BENADRYL) 25 MG tablet Take 25 mg by mouth in the morning.    [provider]  docusate sodium (COLACE) 100 MG capsule Take 100 mg by mouth daily. 10/08/21   [provider]  fluticasone furoate-vilanterol (BREO ELLIPTA) 100-25 MCG/ACT AEPB Inhale 1-2 puffs into the lungs daily.    [provider]  gabapentin (NEURONTIN) 600 MG tablet Take 600 mg by mouth at bedtime. 04/06/21   [provider]  HYDROcodone-acetaminophen (NORCO) 10-325 MG tablet Take 0.5 tablets by mouth 4 (four) times daily.    [provider]  Iron, Ferrous Sulfate, 325 (65 Fe) MG TABS Take 65 mg of iron by mouth daily with breakfast.    [provider]  latanoprost (XALATAN) 0.005 % ophthalmic solution Place 1 drop  into both eyes at bedtime. 11/17/21   [provider]  lovastatin (MEVACOR) 40 MG tablet Take 40 mg by mouth at bedtime.     [provider]  methocarbamol (ROBAXIN) 500 MG tablet Take 1 tablet (500 mg total) by mouth every 6 (six) hours as needed for muscle spasms. Patient taking differently: Take 250 mg by mouth See admin instructions. Take 250 mg by mouth three to four times a day 08/29/21   Consuella Lose, MD  metoprolol tartrate (LOPRESSOR) 25 MG tablet Take 0.5 tablets (12.5 mg total) by mouth 2 (two) times daily. 11/24/21   Ghimire, Henreitta Leber, MD  Multiple Vitamins-Minerals (CENTRUM SILVER 50+WOMEN) TABS Take 1 tablet by mouth daily with breakfast.    [provider]  naloxone (NARCAN) nasal spray 4 mg/0.1 mL Place 1  spray into the nose daily as needed (accidental overdose).    [provider]  nitroGLYCERIN (NITROSTAT) 0.4 MG SL tablet Place 0.4 mg under the tongue every 5 (five) minutes as needed for chest pain.    [provider]  NONFORMULARY OR COMPOUNDED ITEM Please evaluate and treat for outpatient PT Diagnoses: Deconditioning due to Acute Hypoxia/Shock from saddle embolus 11/24/21   Jonetta Osgood, MD  pantoprazole (PROTONIX) 40 MG tablet Take 1 tablet (40 mg total) by mouth daily. 11/25/21   Ghimire, Henreitta Leber, MD  PROAIR HFA 108 [phone] Base) MCG/ACT inhaler Inhale 2 puffs into the lungs every 4 (four) hours as needed for wheezing or shortness of breath. 10/12/16   [provider]  RYBELSUS 3 MG TABS Take 3 mg by mouth daily before breakfast. 05/12/21   [provider]  triamcinolone ointment (KENALOG) 0.5 % Apply 1 Application topically See admin instructions. Apply to affected areas of both feet two to three times a week    [provider]  trimethoprim (TRIMPEX) 100 MG tablet Take 100 mg by mouth in the morning. 10/25/21   [provider]     Family History  Problem Relation Age of Onset   Diabetes Mother     Social History   Socioeconomic History   Marital status: Married    Spouse name: Not on file   Number of children: Not on file   Years of education: Not on file   Highest education level: Not on file  Occupational History   Not on file  Tobacco Use   Smoking status: Former    Types: Cigarettes    Quit date: 05/08/2019    Years since quitting: 2.5   Smokeless tobacco: Never  Vaping Use   Vaping Use: Never used  Substance and Sexual Activity   Alcohol use: No   Drug use: No   Sexual activity: Not on file  Other Topics Concern   Not on file  Social History Narrative   Not on file   Social Determinants of Health   Financial Resource Strain: Not on file  Food Insecurity: Not on file  Transportation Needs: Not on file   Physical Activity: Not on file  Stress: Not on file  Social Connections: Not on file   Review of Systems: A 12 point ROS discussed and pertinent positives are indicated in the HPI above.  All other systems are negative.  Review of Systems  Vital Signs: BP 111/60 (BP Location: Right Arm)   Pulse (!) 58   SpO2 96%   Advance Care Plan: The advanced care plan/surrogate decision maker was discussed at the time of visit and documented in the  medical record.    Physical Exam  Mallampati Score:     Imaging: VAS [place] LOWER EXTREMITY VENOUS (DVT)  Result Date: 11/23/2021  Lower Venous DVT Study Patient Name:  ALISSA PHARR [facility]  Date of Exam:   11/23/2021 Medical Rec #: [id number]       Accession #:    [id number] Date of Birth: 01-15-1946       Patient Gender: F Patient Age:   39 years Exam Location:  [facility] Procedure:      VAS [place] LOWER EXTREMITY VENOUS (DVT) Referring Phys: Oren Binet --------------------------------------------------------------------------------  Indications: Swelling, and pulmonary embolism.  Risk Factors: Confirmed PE. Limitations: Poor ultrasound/tissue interface. Comparison Study: No prior studies. Performing Technologist: Oliver Hum RVT  Examination Guidelines: A complete evaluation includes B-mode imaging, spectral Doppler, color Doppler, and power Doppler as needed of all accessible portions of each vessel. Bilateral testing is considered an integral part of a complete examination. Limited examinations for reoccurring indications may be performed as noted. The reflux portion of the exam is performed with the patient in reverse Trendelenburg.  +---------+---------------+---------+-----------+----------+--------------+ RIGHT    CompressibilityPhasicitySpontaneityPropertiesThrombus Aging +---------+---------------+---------+-----------+----------+--------------+ CFV      Full           Yes      Yes                                  +---------+---------------+---------+-----------+----------+--------------+ SFJ      Full                                                        +---------+---------------+---------+-----------+----------+--------------+ FV Prox  Full                                                        +---------+---------------+---------+-----------+----------+--------------+ FV Mid   Full                                                        +---------+---------------+---------+-----------+----------+--------------+ FV DistalFull                                                        +---------+---------------+---------+-----------+----------+--------------+ PFV      Full                                                        +---------+---------------+---------+-----------+----------+--------------+ POP      Full           Yes      Yes                                 +---------+---------------+---------+-----------+----------+--------------+  PTV      Full                                                        +---------+---------------+---------+-----------+----------+--------------+ PERO     Full                                                        +---------+---------------+---------+-----------+----------+--------------+   +---------+---------------+---------+-----------+----------+--------------+ LEFT     CompressibilityPhasicitySpontaneityPropertiesThrombus Aging +---------+---------------+---------+-----------+----------+--------------+ CFV      Full           Yes      Yes                                 +---------+---------------+---------+-----------+----------+--------------+ SFJ      Full                                                        +---------+---------------+---------+-----------+----------+--------------+ FV Prox  Full                                                         +---------+---------------+---------+-----------+----------+--------------+ FV Mid   Full                                                        +---------+---------------+---------+-----------+----------+--------------+ FV DistalFull                                                        +---------+---------------+---------+-----------+----------+--------------+ PFV      Full                                                        +---------+---------------+---------+-----------+----------+--------------+ POP      Full           Yes      Yes                                 +---------+---------------+---------+-----------+----------+--------------+ PTV      None  Acute          +---------+---------------+---------+-----------+----------+--------------+ PERO     Full                                                        +---------+---------------+---------+-----------+----------+--------------+     Summary: RIGHT: - There is no evidence of deep vein thrombosis in the lower extremity.  - No cystic structure found in the popliteal fossa.  LEFT: - Findings consistent with acute deep vein thrombosis involving one of the paired left posterior tibial veins.  *See table(s) above for measurements and observations. Electronically signed by Jamelle Haring on 11/23/2021 at 2:58:43 PM.    Final    IR THROMBECT PRIM MECH INIT (INCLU) MOD SED  Result Date: 11/22/2021 INDICATION: Briefly, 76 year old female comorbid who presented with syncope and hypotension, with workup revealing massive PE. RV/LV 2.2. EXAM: Procedures; 1. ULTRASOUND GUIDANCE FOR VENOUS ACCESS 2. PULMONARY ARTERIOGRAPHY 3. FLUOROSCOPIC GUIDED CATHETER DIRECTED BILATERAL PULMONARY THROMBECTOMY Performing Physician: Michaelle Birks, MD Assistant(s): Ruthann Cancer, MD A qualified trainee/resident or advanced practice provider (APP) was not immediately available to assist with this case.  COMPARISON:  CTA PE, earlier same day. MEDICATIONS: Heparin 3K IU IV ANESTHESIA/SEDATION: Sedation by the Anesthesia Team was performed. Please see anesthesiology log for details. CONTRAST:  63mL OMNIPAQUE IOHEXOL 300 MG/ML SOLN, 77mL OMNIPAQUE IOHEXOL 300 MG/ML SOLN FLUOROSCOPY TIME:  Fluoroscopic dose; 588 mGy COMPLICATIONS: None immediate. TECHNIQUE: Informed written consent was obtained from the the patient and/or patient's representative after a discussion of the risks, benefits and alternatives to treatment. Questions regarding the procedure were encouraged and answered. A timeout was performed prior to the initiation of the procedure. Ultrasound scanning was performed of the RIGHT groin and demonstrated wide patency of the RIGHT common femoral and GSV vein. The RIGHT groin was prepped and draped in the usual sterile fashion, and a sterile drape was applied covering the operative field. Maximum barrier sterile technique with sterile gowns and gloves were used for the procedure. A timeout was performed prior to the initiation of the procedure. Local anesthesia was provided with 1% lidocaine. Under direct ultrasound guidance, the RIGHT greater saphenous vein was accessed with a micro puncture kit ultimately allowing placement of a 6 Fr vascular sheath. Ultrasound and fluoroscopic spot images were saved for procedural documentation purposes. With the use of a 0.035 inch Bentson wire and an angled pigtail pulmonary catheter, access past the RIGHT heart and into the main pulmonary artery was obtained. Pre intervention pulmonary arterial pressures were obtained from the main pulmonary artery, then a limited central pulmonary arteriogram was performed. The Bentson wire was exchanged for an Amplatz wire then a 16 Fr, 65 cm GORE dry seal sheath then a 16 Fr Penumbra Lightning aspiration catheter was advanced into the LEFT then RIGHT pulmonary arteries and catheter directed thrombectomy was performed. Intermittent and  postprocedural LEFT than RIGHT pulmonary arteriograms were performed confirming thrombus removal. Post interventional pulmonary arterial pressures were obtained from the main pulmonary artery. The catheters and sheath were then removed, and hemostasis was obtained by Perclose ProGlide and manual pressure given the large bore access. The patient tolerated the procedure well without immediate postprocedural complication. FINDINGS: Central pulmonary arteriogram demonstrates large burden bilateral pulmonary emboli with saddle morphology, and obstructing the LEFT inferior lobar branch and RIGHT pulmonary arterial bifurcation. Acquired pressure  measurements: Pre: Main PA mean 40 (normal: < 25/10) Post: Main PA mean 33 (normal: < 25/10) Post intervention pulmonary arteriograms demonstrating adequate clearance from main PA and pulmonary arterial bifurcations. IMPRESSION: 1. Pulmonary arteriogram revealing large burden bilateral pulmonary emboli with saddle morphology, and obstructing the LEFT inferior lobar branch and RIGHT pulmonary arterial bifurcation. 2. Elevated pressure within the main PA compatible with critical pulmonary arterial hypertension. 3. Successful catheter-directed bilateral pulmonary thrombectomy with adequate clearance from the main PA and pulmonary arterial bifurcations. PLAN: Continue systemic anticoagulation with heparin gtt. Management per pulmonary and critical care team. Consider bilateral lower extremities Doppler US to evaluate for DVT. Procedural results were called by telephone at the time of completion to provider Freda Jackson, MD (PCCM) who verbally acknowledged these results. Michaelle Birks, MD [facility] Radiology Electronically Signed   By: Michaelle Birks M.D.   On: 11/22/2021 14:50   IR US Guide Vasc Access Right  Result Date: 11/22/2021 INDICATION: Briefly, 76 year old female comorbid who presented with syncope and hypotension, with  workup revealing massive PE. RV/LV 2.2. EXAM: Procedures; 1. ULTRASOUND GUIDANCE FOR VENOUS ACCESS 2. PULMONARY ARTERIOGRAPHY 3. FLUOROSCOPIC GUIDED CATHETER DIRECTED BILATERAL PULMONARY THROMBECTOMY Performing Physician: Michaelle Birks, MD Assistant(s): Ruthann Cancer, MD A qualified trainee/resident or advanced practice provider (APP) was not immediately available to assist with this case. COMPARISON:  CTA PE, earlier same day. MEDICATIONS: Heparin 3K IU IV ANESTHESIA/SEDATION: Sedation by the Anesthesia Team was performed. Please see anesthesiology log for details. CONTRAST:  20mL OMNIPAQUE IOHEXOL 300 MG/ML SOLN, 40mL OMNIPAQUE IOHEXOL 300 MG/ML SOLN FLUOROSCOPY TIME:  Fluoroscopic dose; 952 mGy COMPLICATIONS: None immediate. TECHNIQUE: Informed written consent was obtained from the the patient and/or patient's representative after a discussion of the risks, benefits and alternatives to treatment. Questions regarding the procedure were encouraged and answered. A timeout was performed prior to the initiation of the procedure. Ultrasound scanning was performed of the RIGHT groin and demonstrated wide patency of the RIGHT common femoral and GSV vein. The RIGHT groin was prepped and draped in the usual sterile fashion, and a sterile drape was applied covering the operative field. Maximum barrier sterile technique with sterile gowns and gloves were used for the procedure. A timeout was performed prior to the initiation of the procedure. Local anesthesia was provided with 1% lidocaine. Under direct ultrasound guidance, the RIGHT greater saphenous vein was accessed with a micro puncture kit ultimately allowing placement of a 6 Fr vascular sheath. Ultrasound and fluoroscopic spot images were saved for procedural documentation purposes. With the use of a 0.035 inch Bentson wire and an angled pigtail pulmonary catheter, access past the RIGHT heart and into the main pulmonary artery was obtained. Pre intervention pulmonary  arterial pressures were obtained from the main pulmonary artery, then a limited central pulmonary arteriogram was performed. The Bentson wire was exchanged for an Amplatz wire then a 16 Fr, 65 cm GORE dry seal sheath then a 16 Fr Penumbra Lightning aspiration catheter was advanced into the LEFT then RIGHT pulmonary arteries and catheter directed thrombectomy was performed. Intermittent and postprocedural LEFT than RIGHT pulmonary arteriograms were performed confirming thrombus removal. Post interventional pulmonary arterial pressures were obtained from the main pulmonary artery. The catheters and sheath were then removed, and hemostasis was obtained by Perclose ProGlide and manual pressure given the large bore access. The patient tolerated the procedure well without immediate postprocedural complication. FINDINGS: Central pulmonary arteriogram demonstrates large burden bilateral pulmonary emboli with saddle morphology, and obstructing the LEFT  inferior lobar branch and RIGHT pulmonary arterial bifurcation. Acquired pressure measurements: Pre: Main PA mean 40 (normal: < 25/10) Post: Main PA mean 33 (normal: < 25/10) Post intervention pulmonary arteriograms demonstrating adequate clearance from main PA and pulmonary arterial bifurcations. IMPRESSION: 1. Pulmonary arteriogram revealing large burden bilateral pulmonary emboli with saddle morphology, and obstructing the LEFT inferior lobar branch and RIGHT pulmonary arterial bifurcation. 2. Elevated pressure within the main PA compatible with critical pulmonary arterial hypertension. 3. Successful catheter-directed bilateral pulmonary thrombectomy with adequate clearance from the main PA and pulmonary arterial bifurcations. PLAN: Continue systemic anticoagulation with heparin gtt. Management per pulmonary and critical care team. Consider bilateral lower extremities Doppler US to evaluate for DVT. Procedural results were called by telephone at the time of completion to  provider Freda Jackson, MD (PCCM) who verbally acknowledged these results. Michaelle Birks, MD [facility] Radiology Electronically Signed   By: Michaelle Birks M.D.   On: 11/22/2021 14:50   IR Angiogram Selective Each Additional Vessel  Result Date: 11/22/2021 INDICATION: Briefly, 76 year old female comorbid who presented with syncope and hypotension, with workup revealing massive PE. RV/LV 2.2. EXAM: Procedures; 1. ULTRASOUND GUIDANCE FOR VENOUS ACCESS 2. PULMONARY ARTERIOGRAPHY 3. FLUOROSCOPIC GUIDED CATHETER DIRECTED BILATERAL PULMONARY THROMBECTOMY Performing Physician: Michaelle Birks, MD Assistant(s): Ruthann Cancer, MD A qualified trainee/resident or advanced practice provider (APP) was not immediately available to assist with this case. COMPARISON:  CTA PE, earlier same day. MEDICATIONS: Heparin 3K IU IV ANESTHESIA/SEDATION: Sedation by the Anesthesia Team was performed. Please see anesthesiology log for details. CONTRAST:  72mL OMNIPAQUE IOHEXOL 300 MG/ML SOLN, 56mL OMNIPAQUE IOHEXOL 300 MG/ML SOLN FLUOROSCOPY TIME:  Fluoroscopic dose; 889 mGy COMPLICATIONS: None immediate. TECHNIQUE: Informed written consent was obtained from the the patient and/or patient's representative after a discussion of the risks, benefits and alternatives to treatment. Questions regarding the procedure were encouraged and answered. A timeout was performed prior to the initiation of the procedure. Ultrasound scanning was performed of the RIGHT groin and demonstrated wide patency of the RIGHT common femoral and GSV vein. The RIGHT groin was prepped and draped in the usual sterile fashion, and a sterile drape was applied covering the operative field. Maximum barrier sterile technique with sterile gowns and gloves were used for the procedure. A timeout was performed prior to the initiation of the procedure. Local anesthesia was provided with 1% lidocaine. Under direct ultrasound guidance, the  RIGHT greater saphenous vein was accessed with a micro puncture kit ultimately allowing placement of a 6 Fr vascular sheath. Ultrasound and fluoroscopic spot images were saved for procedural documentation purposes. With the use of a 0.035 inch Bentson wire and an angled pigtail pulmonary catheter, access past the RIGHT heart and into the main pulmonary artery was obtained. Pre intervention pulmonary arterial pressures were obtained from the main pulmonary artery, then a limited central pulmonary arteriogram was performed. The Bentson wire was exchanged for an Amplatz wire then a 16 Fr, 65 cm GORE dry seal sheath then a 16 Fr Penumbra Lightning aspiration catheter was advanced into the LEFT then RIGHT pulmonary arteries and catheter directed thrombectomy was performed. Intermittent and postprocedural LEFT than RIGHT pulmonary arteriograms were performed confirming thrombus removal. Post interventional pulmonary arterial pressures were obtained from the main pulmonary artery. The catheters and sheath were then removed, and hemostasis was obtained by Perclose ProGlide and manual pressure given the large bore access. The patient tolerated the procedure well without immediate postprocedural complication. FINDINGS: Central pulmonary arteriogram demonstrates large burden  bilateral pulmonary emboli with saddle morphology, and obstructing the LEFT inferior lobar branch and RIGHT pulmonary arterial bifurcation. Acquired pressure measurements: Pre: Main PA mean 40 (normal: < 25/10) Post: Main PA mean 33 (normal: < 25/10) Post intervention pulmonary arteriograms demonstrating adequate clearance from main PA and pulmonary arterial bifurcations. IMPRESSION: 1. Pulmonary arteriogram revealing large burden bilateral pulmonary emboli with saddle morphology, and obstructing the LEFT inferior lobar branch and RIGHT pulmonary arterial bifurcation. 2. Elevated pressure within the main PA compatible with critical pulmonary arterial  hypertension. 3. Successful catheter-directed bilateral pulmonary thrombectomy with adequate clearance from the main PA and pulmonary arterial bifurcations. PLAN: Continue systemic anticoagulation with heparin gtt. Management per pulmonary and critical care team. Consider bilateral lower extremities Doppler US to evaluate for DVT. Procedural results were called by telephone at the time of completion to provider Freda Jackson, MD (PCCM) who verbally acknowledged these results. Michaelle Birks, MD [facility] Electronically Signed   By: Michaelle Birks M.D.   On: 11/22/2021 14:50   IR Angiogram Pulmonary Bilateral Selective  Result Date: 11/22/2021 INDICATION: Briefly, 76 year old female comorbid who presented with syncope and hypotension, with workup revealing massive PE. RV/LV 2.2. EXAM: Procedures; 1. ULTRASOUND GUIDANCE FOR VENOUS ACCESS 2. PULMONARY ARTERIOGRAPHY 3. FLUOROSCOPIC GUIDED CATHETER DIRECTED BILATERAL PULMONARY THROMBECTOMY Performing Physician: Michaelle Birks, MD Assistant(s): Ruthann Cancer, MD A qualified trainee/resident or advanced practice provider (APP) was not immediately available to assist with this case. COMPARISON:  CTA PE, earlier same day. MEDICATIONS: Heparin 3K IU IV ANESTHESIA/SEDATION: Sedation by the Anesthesia Team was performed. Please see anesthesiology log for details. CONTRAST:  16mL OMNIPAQUE IOHEXOL 300 MG/ML SOLN, 70mL OMNIPAQUE IOHEXOL 300 MG/ML SOLN FLUOROSCOPY TIME:  Fluoroscopic dose; 409 mGy COMPLICATIONS: None immediate. TECHNIQUE: Informed written consent was obtained from the the patient and/or patient's representative after a discussion of the risks, benefits and alternatives to treatment. Questions regarding the procedure were encouraged and answered. A timeout was performed prior to the initiation of the procedure. Ultrasound scanning was performed of the RIGHT groin and demonstrated wide patency of the RIGHT  common femoral and GSV vein. The RIGHT groin was prepped and draped in the usual sterile fashion, and a sterile drape was applied covering the operative field. Maximum barrier sterile technique with sterile gowns and gloves were used for the procedure. A timeout was performed prior to the initiation of the procedure. Local anesthesia was provided with 1% lidocaine. Under direct ultrasound guidance, the RIGHT greater saphenous vein was accessed with a micro puncture kit ultimately allowing placement of a 6 Fr vascular sheath. Ultrasound and fluoroscopic spot images were saved for procedural documentation purposes. With the use of a 0.035 inch Bentson wire and an angled pigtail pulmonary catheter, access past the RIGHT heart and into the main pulmonary artery was obtained. Pre intervention pulmonary arterial pressures were obtained from the main pulmonary artery, then a limited central pulmonary arteriogram was performed. The Bentson wire was exchanged for an Amplatz wire then a 16 Fr, 65 cm GORE dry seal sheath then a 16 Fr Penumbra Lightning aspiration catheter was advanced into the LEFT then RIGHT pulmonary arteries and catheter directed thrombectomy was performed. Intermittent and postprocedural LEFT than RIGHT pulmonary arteriograms were performed confirming thrombus removal. Post interventional pulmonary arterial pressures were obtained from the main pulmonary artery. The catheters and sheath were then removed, and hemostasis was obtained by Perclose ProGlide and manual pressure given the large bore access. The patient tolerated the procedure well without immediate  postprocedural complication. FINDINGS: Central pulmonary arteriogram demonstrates large burden bilateral pulmonary emboli with saddle morphology, and obstructing the LEFT inferior lobar branch and RIGHT pulmonary arterial bifurcation. Acquired pressure measurements: Pre: Main PA mean 40 (normal: < 25/10) Post: Main PA mean 33 (normal: < 25/10) Post  intervention pulmonary arteriograms demonstrating adequate clearance from main PA and pulmonary arterial bifurcations. IMPRESSION: 1. Pulmonary arteriogram revealing large burden bilateral pulmonary emboli with saddle morphology, and obstructing the LEFT inferior lobar branch and RIGHT pulmonary arterial bifurcation. 2. Elevated pressure within the main PA compatible with critical pulmonary arterial hypertension. 3. Successful catheter-directed bilateral pulmonary thrombectomy with adequate clearance from the main PA and pulmonary arterial bifurcations. PLAN: Continue systemic anticoagulation with heparin gtt. Management per pulmonary and critical care team. Consider bilateral lower extremities Doppler US to evaluate for DVT. Procedural results were called by telephone at the time of completion to provider Freda Jackson, MD (PCCM) who verbally acknowledged these results. Michaelle Birks, MD [facility] Radiology Electronically Signed   By: Michaelle Birks M.D.   On: 11/22/2021 14:50   IR Angiogram Selective Each Additional Vessel  Result Date: 11/22/2021 INDICATION: Briefly, 76 year old female comorbid who presented with syncope and hypotension, with workup revealing massive PE. RV/LV 2.2. EXAM: Procedures; 1. ULTRASOUND GUIDANCE FOR VENOUS ACCESS 2. PULMONARY ARTERIOGRAPHY 3. FLUOROSCOPIC GUIDED CATHETER DIRECTED BILATERAL PULMONARY THROMBECTOMY Performing Physician: Michaelle Birks, MD Assistant(s): Ruthann Cancer, MD A qualified trainee/resident or advanced practice provider (APP) was not immediately available to assist with this case. COMPARISON:  CTA PE, earlier same day. MEDICATIONS: Heparin 3K IU IV ANESTHESIA/SEDATION: Sedation by the Anesthesia Team was performed. Please see anesthesiology log for details. CONTRAST:  25mL OMNIPAQUE IOHEXOL 300 MG/ML SOLN, 9mL OMNIPAQUE IOHEXOL 300 MG/ML SOLN FLUOROSCOPY TIME:  Fluoroscopic dose; 127 mGy COMPLICATIONS: None  immediate. TECHNIQUE: Informed written consent was obtained from the the patient and/or patient's representative after a discussion of the risks, benefits and alternatives to treatment. Questions regarding the procedure were encouraged and answered. A timeout was performed prior to the initiation of the procedure. Ultrasound scanning was performed of the RIGHT groin and demonstrated wide patency of the RIGHT common femoral and GSV vein. The RIGHT groin was prepped and draped in the usual sterile fashion, and a sterile drape was applied covering the operative field. Maximum barrier sterile technique with sterile gowns and gloves were used for the procedure. A timeout was performed prior to the initiation of the procedure. Local anesthesia was provided with 1% lidocaine. Under direct ultrasound guidance, the RIGHT greater saphenous vein was accessed with a micro puncture kit ultimately allowing placement of a 6 Fr vascular sheath. Ultrasound and fluoroscopic spot images were saved for procedural documentation purposes. With the use of a 0.035 inch Bentson wire and an angled pigtail pulmonary catheter, access past the RIGHT heart and into the main pulmonary artery was obtained. Pre intervention pulmonary arterial pressures were obtained from the main pulmonary artery, then a limited central pulmonary arteriogram was performed. The Bentson wire was exchanged for an Amplatz wire then a 16 Fr, 65 cm GORE dry seal sheath then a 16 Fr Penumbra Lightning aspiration catheter was advanced into the LEFT then RIGHT pulmonary arteries and catheter directed thrombectomy was performed. Intermittent and postprocedural LEFT than RIGHT pulmonary arteriograms were performed confirming thrombus removal. Post interventional pulmonary arterial pressures were obtained from the main pulmonary artery. The catheters and sheath were then removed, and hemostasis was obtained by Perclose ProGlide and manual pressure given the large  bore access.  The patient tolerated the procedure well without immediate postprocedural complication. FINDINGS: Central pulmonary arteriogram demonstrates large burden bilateral pulmonary emboli with saddle morphology, and obstructing the LEFT inferior lobar branch and RIGHT pulmonary arterial bifurcation. Acquired pressure measurements: Pre: Main PA mean 40 (normal: < 25/10) Post: Main PA mean 33 (normal: < 25/10) Post intervention pulmonary arteriograms demonstrating adequate clearance from main PA and pulmonary arterial bifurcations. IMPRESSION: 1. Pulmonary arteriogram revealing large burden bilateral pulmonary emboli with saddle morphology, and obstructing the LEFT inferior lobar branch and RIGHT pulmonary arterial bifurcation. 2. Elevated pressure within the main PA compatible with critical pulmonary arterial hypertension. 3. Successful catheter-directed bilateral pulmonary thrombectomy with adequate clearance from the main PA and pulmonary arterial bifurcations. PLAN: Continue systemic anticoagulation with heparin gtt. Management per pulmonary and critical care team. Consider bilateral lower extremities Doppler US to evaluate for DVT. Procedural results were called by telephone at the time of completion to provider Freda Jackson, MD (PCCM) who verbally acknowledged these results. Michaelle Birks, MD [facility] Radiology Electronically Signed   By: Michaelle Birks M.D.   On: 11/22/2021 14:50   ECHOCARDIOGRAM COMPLETE  Result Date: 11/21/2021    ECHOCARDIOGRAM REPORT   Patient Name:   NAASIA WEILBACHER [facility] Date of Exam: 11/21/2021 Medical Rec #:  [id number]      Height:       56.0 in Accession #:    [id number]     Weight:       157.4 lb Date of Birth:  07/19/1945      BSA:          1.603 m Patient Age:    58 years       BP:           87/30 mmHg Patient Gender: F              HR:           101 bpm. Exam Location:  Inpatient Procedure: 2D Echo, Color Doppler and Cardiac Doppler  Indications:    Pulmonary embolism  History:        Patient has no prior history of Echocardiogram examinations.                 CAD; Risk Factors:Diabetes, Hypertension and Sleep Apnea.  Sonographer:    Jefferey Pica Referring Phys: [id number] Charlo  1. Left ventricular ejection fraction, by estimation, is 60 to 65%. The left ventricle has normal function. The left ventricle has no regional wall motion abnormalities. There is mild left ventricular hypertrophy. Left ventricular diastolic parameters are indeterminate. There is the interventricular septum is flattened in diastole ('D' shaped left ventricle), consistent with right ventricular volume overload.  2. McConnell's Sign. Right ventricular systolic function is moderately reduced. The right ventricular size is moderately enlarged. There is severely elevated pulmonary artery systolic pressure. The estimated right ventricular systolic pressure is 62.8 mmHg.  3. The mitral valve is normal in structure. No evidence of mitral valve regurgitation.  4. Tricuspid valve regurgitation is mild to moderate.  5. There is mild calcification of the aortic valve. Aortic valve regurgitation is not visualized.  6. The inferior vena cava is dilated in size with <50% respiratory variability, suggesting right atrial pressure of 15 mmHg. Conclusion(s)/Recommendation(s): Evidence of RV strain with diagnosis of PE. FINDINGS  Left Ventricle: Left ventricular ejection fraction, by estimation, is 60 to 65%. The left ventricle has normal function. The left ventricle has no regional wall  motion abnormalities. The left ventricular internal cavity size was normal in size. There is  mild left ventricular hypertrophy. The interventricular septum is flattened in diastole ('D' shaped left ventricle), consistent with right ventricular volume overload. Left ventricular diastolic parameters are indeterminate. Right Ventricle: McConnell's Sign. The right ventricular size is  moderately enlarged. No increase in right ventricular wall thickness. Right ventricular systolic function is moderately reduced. There is severely elevated pulmonary artery systolic pressure. The tricuspid regurgitant velocity is 3.52 m/s, and with an assumed right atrial pressure of 15 mmHg, the estimated right ventricular systolic pressure is 64.6 mmHg. Left Atrium: Left atrial size was normal in size. Right Atrium: Right atrial size was normal in size. Pericardium: There is no evidence of pericardial effusion. Presence of epicardial fat layer. Mitral Valve: The mitral valve is normal in structure. No evidence of mitral valve regurgitation. Tricuspid Valve: The tricuspid valve is normal in structure. Tricuspid valve regurgitation is mild to moderate. Aortic Valve: There is mild calcification of the aortic valve. Aortic valve regurgitation is not visualized. Aortic valve peak gradient measures 3.7 mmHg. Pulmonic Valve: Pulmonic valve regurgitation is not visualized. Aorta: The aortic root and ascending aorta are structurally normal, with no evidence of dilitation. Venous: The inferior vena cava is dilated in size with less than 50% respiratory variability, suggesting right atrial pressure of 15 mmHg. IAS/Shunts: No atrial level shunt detected by color flow Doppler.  LEFT VENTRICLE PLAX 2D LVIDd:         3.20 cm LVIDs:         2.60 cm LV PW:         1.30 cm LV IVS:        1.30 cm LVOT diam:     1.80 cm LV SV:         28 LV SV Index:   17 LVOT Area:     2.54 cm  RIGHT VENTRICLE            IVC RV Basal diam:  2.90 cm    IVC diam: 2.60 cm RV Mid diam:    4.10 cm RV S prime:     8.05 cm/s LEFT ATRIUM           Index        RIGHT ATRIUM           Index LA diam:      3.70 cm 2.31 cm/m   RA Area:     16.00 cm LA Vol (A2C): 47.3 ml 29.50 ml/m  RA Volume:   37.70 ml  23.51 ml/m LA Vol (A4C): 46.0 ml 28.69 ml/m  AORTIC VALVE                 PULMONIC VALVE AV Area (Vmax): 1.94 cm     PV Vmax:       0.66 m/s AV Vmax:         95.72 cm/s   PV Peak grad:  1.7 mmHg AV Peak Grad:   3.7 mmHg LVOT Vmax:      73.10 cm/s LVOT Vmean:     41.050 cm/s LVOT VTI:       0.108 m  AORTA Ao Root diam: 3.00 cm Ao Asc diam:  3.20 cm MITRAL VALVE               TRICUSPID VALVE MV Area (PHT): 4.05 cm    TR Peak grad:   49.6 mmHg MV Decel Time: 187 msec    TR Vmax:  352.00 cm/s MV E velocity: 79.36 cm/s MV A velocity: 63.40 cm/s  SHUNTS MV E/A ratio:  1.25        Systemic VTI:  0.11 m                            Systemic Diam: 1.80 cm Phineas Inches Electronically signed by Phineas Inches Signature Date/Time: 11/21/2021/11:13:15 AM    Final    CT Angio Chest Pulmonary Embolism (PE) W or WO Contrast  Result Date: 11/21/2021 CLINICAL DATA:  Pulmonary embolism on CTA neck of earlier in the day. EXAM: CT ANGIOGRAPHY CHEST WITH CONTRAST TECHNIQUE: Multidetector CT imaging of the chest was performed using the standard protocol during bolus administration of intravenous contrast. Multiplanar CT image reconstructions and MIPs were obtained to evaluate the vascular anatomy. RADIATION DOSE REDUCTION: This exam was performed according to the departmental dose-optimization program which includes automated exposure control, adjustment of the mA and/or kV according to patient size and/or use of iterative reconstruction technique. CONTRAST:  63mL OMNIPAQUE IOHEXOL 350 MG/ML SOLN COMPARISON:  Plain film of earlier today. CTA head of 11/22/2018 lung cancer screening CT of 04/23/2021 from [facility]. FINDINGS: Cardiovascular: Saddle embolism again identified, with clot continuing into left-greater-than-right lobar pulmonary artery branches. Example 56/5 and 67/5. The RV/LV ratio is increased, on the order of 4.6/2.1. Aortic atherosclerosis. Mild cardiomegaly with trace pericardial fluid. Multivessel coronary artery atherosclerosis. Pulmonary artery enlargement, outflow tract 3.3 cm Mediastinum/Nodes: No mediastinal or hilar adenopathy. Lungs/Pleura: No pleural fluid.  Mild centrilobular and paraseptal emphysema. Upper Abdomen: Normal imaged portions of the liver, spleen stomach, adrenal glands. Possible upper pole left renal collecting system punctate calculi. Trace perihepatic ascites Musculoskeletal: Dorsal spinal stimulator. Advanced thoracic spondylosis. Review of the MIP images confirms the above findings. IMPRESSION: 1. Redemonstration of saddle pulmonary embolus. Positive for acute PE with CT evidence of right heart strain (RV/LV Ratio = 2.2) consistent with at least submassive (intermediate risk) PE. The presence of right heart strain has been associated with an increased risk of morbidity and mortality. Please refer to the "Code PE Focused" order set in EPIC. 2. Aortic atherosclerosis (ICD10-I70.0), coronary artery atherosclerosis and emphysema (ICD10-J43.9). Electronically Signed   By: Abigail Miyamoto M.D.   On: 11/21/2021 10:35   DG Chest Port 1 View  Result Date: 11/21/2021 CLINICAL DATA:  76 year old female with saddle embolus identified on CTA for code stroke this morning. EXAM: PORTABLE CHEST 1 VIEW COMPARISON:  CTA head and neck 0731 hours. Prior chest radiographs 01/26/2018. FINDINGS: Portable AP semi upright view at 0836 hours. The patient is rotated to the right. Mediastinal contours not significantly changed since 2019. Allowing for portable technique the lungs are clear. No pneumothorax or pleural effusion. Visualized tracheal air column is within normal limits. Partially visible lumbar fusion hardware. Thoracic spinal stimulator device is new since 2019. No acute osseous abnormality identified. Paucity of bowel gas. IMPRESSION: Lungs remain clear despite Saddle Embolus demonstrated on CTA Neck this morning. Electronically Signed   By: Genevie Ann M.D.   On: 11/21/2021 08:52   CT ANGIO HEAD NECK W WO CM (CODE STROKE)  Result Date: 11/21/2021 CLINICAL DATA:  Code stroke.  76 year old female EXAM: CT ANGIOGRAPHY HEAD AND NECK TECHNIQUE: Multidetector CT  imaging of the head and neck was performed using the standard protocol during bolus administration of intravenous contrast. Multiplanar CT image reconstructions and MIPs were obtained to evaluate the vascular anatomy. Carotid stenosis measurements (when applicable) are  obtained utilizing NASCET criteria, using the distal internal carotid diameter as the denominator. RADIATION DOSE REDUCTION: This exam was performed according to the departmental dose-optimization program which includes automated exposure control, adjustment of the mA and/or kV according to patient size and/or use of iterative reconstruction technique. CONTRAST:  80mL OMNIPAQUE IOHEXOL 350 MG/ML SOLN COMPARISON:  Plain head CT 0722 hours today. [facility] CT cervical spine 12/20/2020. FINDINGS: CTA NECK Skeleton: Absent dentition. Widespread cervical spine degeneration. Upper thoracic disc and endplate degeneration. No acute osseous abnormality identified. Upper chest: Acute pulmonary embolus with SADDLE EMBOLUS VISIBLE. Bilateral lobar clot. Some underlying emphysema, but visible lung parenchyma is clear. No pleural effusion. Partially visible material in the left mainstem bronchus. No superior mediastinal lymphadenopathy. Other neck: Chronic polypoid opacity left nasopharynx series 19, image 63 not significantly changed from last year. No superimposed cervical lymphadenopathy identified. No other neck mass. Aortic arch: 3 vessel arch configuration. Mildly tortuous aortic arch. Mild to moderate plaque in the distal arch and proximal descending thoracic aorta. Right carotid system: Brachiocephalic soft and calcified plaque without stenosis. Negative right CCA. Calcified plaque at the medial left ICA origin and bulb but less than 50% stenosis with respect to the distal vessel. Tortuous right ICA at C1. Left carotid system: Negative left CCA. Mild left ICA calcified plaque without stenosis. Vertebral arteries: Proximal right subclavian soft and  calcified plaque without stenosis. Normal right vertebral artery origin. Right vertebral artery is patent without stenosis to the skull base. Soft and calcified proximal left subclavian artery plaque without stenosis. Bulky similar plaque at the left vertebral origin resulting in severe stenosis (series 12, image 125). Despite string-sign appearing stenosis the left vertebral artery remains patent, and is fairly codominant. Left vertebral remains patent to the skull base with no additional stenosis. CTA HEAD Posterior circulation: Bilateral V4 segment calcified plaque with mild stenosis. Right V4 segment appears somewhat dominant. Normal left PICA origin and dominant appearing right AICA. Patent vertebrobasilar junction and basilar artery without stenosis. SCA and PCA origins are normal. Posterior communicating arteries are diminutive or absent. Bilateral PCA branches are within normal limits. Anterior circulation: Both ICA siphons are patent with calcified plaque. Mild supraclinoid siphon stenosis on the right side. Patent carotid termini, MCA and ACA origins. Normal anterior communicating artery and bilateral ACA branches. Left MCA M1 segment and bifurcation are patent without stenosis. Right MCA M1 segment and bifurcation are patent without stenosis. Bilateral MCA branches are within normal limits. Venous sinuses: Patent. Anatomic variants: Mildly dominant right vertebral artery. Review of the MIP images confirms the above findings Critical Value/emergent results were called by telephone at the time of interpretation on 11/21/2021 at 7:48 am to Dr. Donnetta Simpers and ED Dr. Vanita Panda, who verbally acknowledged these results. IMPRESSION: 1. Positive for Acute PE with Saddle Embolus and bilateral lobar thrombus visible in the upper chest. Critical Value/emergent results were called by telephone at the time of interpretation on 11/21/2021 at 7:48 am to Dr. Donnetta Simpers and ED Dr. Vanita Panda, who verbally  acknowledged these results. 2. Negative for ELVO. Very Severe atherosclerotic stenosis at the Left Vertebral Artery origin, but the vessel remains patent. Generally mild for age atherosclerosis elsewhere in the head and neck. Aortic Atherosclerosis (ICD10-I70.0). 3. Chronic Left nasopharynx polyp, not significantly changed from last year. Recommend follow-up with ENT for direct visualization. Electronically Signed   By: Genevie Ann M.D.   On: 11/21/2021 07:57   CT HEAD CODE STROKE WO CONTRAST  Addendum Date: 11/21/2021   ADDENDUM  REPORT: 11/21/2021 07:44 ADDENDUM: Study discussed by telephone with Dr. Donnetta Simpers on 11/21/2021 at 0739 hours. Electronically Signed   By: Genevie Ann M.D.   On: 11/21/2021 07:44   Result Date: 11/21/2021 CLINICAL DATA:  Code stroke.  76 year old female EXAM: CT HEAD WITHOUT CONTRAST TECHNIQUE: Contiguous axial images were obtained from the base of the skull through the vertex without intravenous contrast. RADIATION DOSE REDUCTION: This exam was performed according to the departmental dose-optimization program which includes automated exposure control, adjustment of the mA and/or kV according to patient size and/or use of iterative reconstruction technique. COMPARISON:  Head CT 12/20/2020. FINDINGS: Brain: Stable cerebral volume. No midline shift, ventriculomegaly, mass effect, evidence of mass lesion, intracranial hemorrhage or evidence of cortically based acute infarction. Small area of chronic encephalomalacia in the left occipital pole is stable from last year. Elsewhere patchy bilateral white matter hypodensity appears fairly stable and symmetric. Deep gray nuclei, brainstem and cerebellum appear negative. Vascular: Calcified atherosclerosis at the skull base. No suspicious intracranial vascular hyperdensity. Skull: Clivus appears to remain intact. No acute osseous abnormality identified. Sinuses/Orbits: Visualized paranasal sinuses and mastoids are stable and well aerated.  Other: No acute orbit or scalp soft tissue finding. Lobulated chronic polypoid lesion in the posterior left nasal cavity appears stable from last year on series 4, image 6. ASPECTS [facility] Stroke Program Early CT Score) Total score (0-10 with 10 being normal): Ten IMPRESSION: 1. No acute cortically based infarct or acute intracranial hemorrhage identified. ASPECTS 10. 2. Small chronic Left PCA territory infarct. Mild for age bilateral white matter changes. 3. Chronic left nasal cavity or nasopharynx polypoid soft tissue lesion, not significantly changed from last year favoring benign etiology. Recommend follow-up with ENT for direct visualization. Electronically Signed: By: Genevie Ann M.D. On: 11/21/2021 07:31    Labs:  CBC: Recent Labs    11/21/21 1930 11/22/21 0450 11/22/21 1632 11/24/21 1031  WBC 6.8 6.4 6.9 5.7  HGB 12.5 11.6* 11.5* 12.1  HCT 37.9 35.8* 34.9* 37.0  PLT 130* 125* 133* 133*    COAGS: Recent Labs    11/21/21 0835 11/21/21 1502 11/21/21 1925  INR 1.4* 1.3*  --   APTT 32 >200* 130*    BMP: Recent Labs    08/26/21 1600 11/21/21 0721 11/21/21 0750 11/22/21 0450 11/24/21 1031  NA 139 137 136 137 137  K 4.5 4.5 4.3 4.5 3.9  CL 105 106 106 112* 110  CO2 28  --  18* 18* 22  GLUCOSE 105* 159* 157* 132* 160*  BUN 22 30* 25* 21 18  CALCIUM 10.3  --  10.0 8.9 9.7  CREATININE 1.67* 1.50* 1.49* 1.30* 1.56*  GFRNONAA 32*  --  36* 43* 34*    LIVER FUNCTION TESTS: Recent Labs    11/21/21 0750  BILITOT 0.7  AST 25  ALT 16  ALKPHOS 54  PROT 5.6*  ALBUMIN 3.3*    TUMOR MARKERS: No results for input(s): "AFPTM", "CEA", "CA199", "CHROMGRNA" in the last 8760 hours.  Assessment and Plan:  76 year old woman with solid enhancing mass of the right kidney, consistent with RCC, presents to IR clinic for consideration for percutaneous ablation.  Unfortunately, she is not a candidate for percutaneous microwave or cryoablation given the central location of the mass.   There would be a high risk of damage to the renal collecting system with microwave and a high risk of hemorrhage with cryoablation.  I went over the steps, risks, and benefits of the procedures  with her and her husband.  I explained to them why it would be too high risk to perform percutaneous ablation.  They were disappointed, but understood the reasoning.  They did not have any additional questions.  Thank you for this interesting consult.  I greatly enjoyed meeting ARMONEE BOJANOWSKI and look forward to participating in their care.  A copy of this report was sent to the requesting provider on this date.  Electronically Signed: Paula Libra Attallah Ontko 12/11/2021, 2:43 PM   I spent a total of 40 Minutes in face to face in clinical consultation, greater than 50% of which was counseling/coordinating care for right renal malignancy.

## 2021-12-17 DIAGNOSIS — E6609 Other obesity due to excess calories: Secondary | ICD-10-CM | POA: Diagnosis not present

## 2021-12-17 DIAGNOSIS — Z6834 Body mass index (BMI) 34.0-34.9, adult: Secondary | ICD-10-CM | POA: Diagnosis not present

## 2021-12-17 DIAGNOSIS — M5136 Other intervertebral disc degeneration, lumbar region: Secondary | ICD-10-CM | POA: Diagnosis not present

## 2021-12-17 DIAGNOSIS — F1190 Opioid use, unspecified, uncomplicated: Secondary | ICD-10-CM | POA: Diagnosis not present

## 2021-12-17 DIAGNOSIS — Z9689 Presence of other specified functional implants: Secondary | ICD-10-CM | POA: Diagnosis not present

## 2021-12-17 DIAGNOSIS — M48062 Spinal stenosis, lumbar region with neurogenic claudication: Secondary | ICD-10-CM | POA: Diagnosis not present

## 2021-12-25 DIAGNOSIS — N1832 Chronic kidney disease, stage 3b: Secondary | ICD-10-CM | POA: Diagnosis not present

## 2021-12-31 ENCOUNTER — Encounter: Payer: Self-pay | Admitting: Pulmonary Disease

## 2021-12-31 ENCOUNTER — Ambulatory Visit: Payer: Medicare HMO | Admitting: Pulmonary Disease

## 2021-12-31 VITALS — BP 118/74 | HR 84 | Ht <= 58 in | Wt 162.0 lb

## 2021-12-31 DIAGNOSIS — N1832 Chronic kidney disease, stage 3b: Secondary | ICD-10-CM | POA: Diagnosis not present

## 2021-12-31 DIAGNOSIS — E1122 Type 2 diabetes mellitus with diabetic chronic kidney disease: Secondary | ICD-10-CM | POA: Diagnosis not present

## 2021-12-31 DIAGNOSIS — I129 Hypertensive chronic kidney disease with stage 1 through stage 4 chronic kidney disease, or unspecified chronic kidney disease: Secondary | ICD-10-CM | POA: Diagnosis not present

## 2021-12-31 DIAGNOSIS — I2602 Saddle embolus of pulmonary artery with acute cor pulmonale: Secondary | ICD-10-CM | POA: Diagnosis not present

## 2021-12-31 DIAGNOSIS — N2581 Secondary hyperparathyroidism of renal origin: Secondary | ICD-10-CM | POA: Diagnosis not present

## 2021-12-31 DIAGNOSIS — D631 Anemia in chronic kidney disease: Secondary | ICD-10-CM | POA: Diagnosis not present

## 2021-12-31 NOTE — Patient Instructions (Signed)
We will schedule you for echo in 2 months  Continue eliquis '5mg'$  twice daily  Follow up in 2 months

## 2021-12-31 NOTE — Progress Notes (Signed)
Synopsis: Referred in September 2023 for hospital follow up for pulmonary embolus  Subjective:   PATIENT ID: Lisa Maxwell GENDER: female DOB: [birth year], MRN: [id number]  HPI  Chief Complaint  Patient presents with   Hospitalization Follow-up    HFU for PE   Lisa Maxwell is a 76 year old woman, former smoker with CKD, COPD, DMII and cauda equina compression s/p L2-L3 laminectomy with facetectomy and placement of anterior interbody device 08/2021 who was admitted 11/21/21 for massive saddle pulmonary embolus s/p mechanical thrombectomy by interventional radiology.   She was discharged on eliquis '5mg'$  BID. She has history of atrial fibrillation and was not on anticoagulation given her fall risks.   She has been doing well since time of discharge.  She denies any chest pain or shortness of breath.  She denies any bleeding issues in her stools or urine.  She does have bilateral lower extremity edema that is overall improved.  Past Medical History:  Diagnosis Date   CAD (coronary artery disease) 11/04/2016   Cancer ([place])    skin cancer   Cerebrovascular disease    Chronic atrial fibrillation ([place]) 09/28/2018   Chronic bilateral low back pain without sciatica 03/08/2018   Chronic kidney disease    Chronic obstructive lung disease ([place]) 09/28/2018   Chronic pain syndrome 03/08/2018   Chronic renal failure 09/28/2018   Chronic, continuous use of opioids 03/04/2021   Last Assessment & Plan:  Formatting of this note might be different from the original. Patient and I have discussed the hazardous effects of continued opiate pain medication usage. Risks and benefits of above medications including but not limited to possibility of respiratory depression, sedation, and even death were discussed with the patient who expressed an understanding.  Patient did not displ   CKD (chronic kidney disease) stage 3, GFR 30-59 ml/min (HCC)    Comprehensive diabetic foot examination, type 2 DM, encounter for  ([name]) 04/29/2021   DDD (degenerative disc disease), lumbar 11/17/2018   Last Assessment & Plan:  Formatting of this note might be different from the original. See spinal stenosis plan   Diabetes (Lisa Maxwell)    Diabetes mellitus due to underlying condition with unspecified complications (Lisa Maxwell) 40/11/6759   Dyshidrotic eczema 04/29/2021   Dysrhythmia    afib   Essential hypertension 11/04/2016   Facet arthritis of lumbar region 03/08/2018   Facet joint disease 11/17/2018   Last Assessment & Plan:  Formatting of this note might be different from the original. See spinal stenosis plan   Foraminal stenosis of lumbar region 03/08/2018   Heart murmur    History of knee replacement, total, bilateral 11/17/2018   Last Assessment & Plan:  Formatting of this note might be different from the original. Have recommended to her several times if any further concerns in regards to the pain status post arthroplasty, she is to follow up with her surgeon.   Hypertension    Hypertensive disorder 09/28/2018   Increased body mass index 09/28/2018   Lumbar radiculopathy 03/08/2018   Lumbar spondylosis 03/20/2019   Mixed dyslipidemia 11/04/2016   Morbid obesity (Lisa Maxwell) 11/04/2016   Muscle pain 04/29/2020   Osteoarthritis of left knee    PAF (paroxysmal atrial fibrillation) (Lisa Maxwell) 04/30/2015   Pain management contract agreement 04/13/2019   Last Assessment & Plan:  Formatting of this note might be different from the original. Contract updated today.  UDS completed.  [place] controlled substance registry reviewed and is consistent with her regimen.   Peripheral vascular  disease (Lisa Maxwell) 09/28/2018   Preop cardiovascular exam 05/01/2019   Pulmonary emboli [name])    Renal insufficiency 04/30/2015   S/P insertion of spinal cord stimulator 07/13/2019   Last Assessment & Plan:  Formatting of this note might be different from the original. About 5 months status post insertion of spinal cord stimulator with excellent  relief.  She continues to keep close follow-up with her representative and has been working to appropriately find best programs to get her adequate pain relief.   Sleep apnea    Smoker 09/28/2018   Spinal stenosis of lumbar region 03/20/2019   Spinal stenosis of lumbar region with neurogenic claudication 03/08/2018   Last Assessment & Plan:  Formatting of this note is different from the original. 76 year old female with chronic low back pain and bilateral, right greater than left, L4-5 radicular pain, and claudication. She has substantial amount of multifactorial degenerative thoracic and lumbar spine pathology.  After being deemed an inappropriate open surgical candidate, and failing to respond to multiple in   Spondylolisthesis 03/20/2019   Spondylosis of lumbosacral region without myelopathy or radiculopathy 01/30/2019   Last Assessment & Plan:  Formatting of this note might be different from the original. See spinal stenosis plan   Type 2 diabetes mellitus (Lisa Maxwell) 09/28/2018     Family History  Problem Relation Age of Onset   Diabetes Mother      Social History   Socioeconomic History   Marital status: Married    Spouse name: Not on file   Number of children: Not on file   Years of education: Not on file   Highest education level: Not on file  Occupational History   Not on file  Tobacco Use   Smoking status: Former    Types: Cigarettes    Quit date: 05/08/2019    Years since quitting: 2.6   Smokeless tobacco: Never  Vaping Use   Vaping Use: Never used  Substance and Sexual Activity   Alcohol use: No   Drug use: No   Sexual activity: Not on file  Other Topics Concern   Not on file  Social History Narrative   Not on file   Social Determinants of Health   Financial Resource Strain: Not on file  Food Insecurity: Not on file  Transportation Needs: Not on file  Physical Activity: Not on file  Stress: Not on file  Social Connections: Not on file  Intimate Partner  Violence: Not on file     No Known Allergies   Outpatient Medications Prior to Visit  Medication Sig Dispense Refill   apixaban (ELIQUIS) 5 MG TABS tablet Take 1 tablet (5 mg total) by mouth 2 (two) times daily. 60 tablet 12   Coenzyme Q10 (CO Q-10) 200 MG CAPS Take 200 mg by mouth in the morning.     Cyanocobalamin (VITAMIN B-12) 500 MCG SUBL Place 500 mcg under the tongue in the morning.     diphenhydrAMINE (BENADRYL) 25 MG tablet Take 25 mg by mouth in the morning.     docusate sodium (COLACE) 100 MG capsule Take 100 mg by mouth daily.     fluticasone furoate-vilanterol (BREO ELLIPTA) 100-25 MCG/ACT AEPB Inhale 1-2 puffs into the lungs daily.     gabapentin (NEURONTIN) 600 MG tablet Take 600 mg by mouth at bedtime.     HYDROcodone-acetaminophen (NORCO) 10-325 MG tablet Take 0.5 tablets by mouth 4 (four) times daily.     Iron, Ferrous Sulfate, 325 (65 Fe) MG TABS Take 65 mg  of iron by mouth daily with breakfast.     latanoprost (XALATAN) 0.005 % ophthalmic solution Place 1 drop into both eyes at bedtime.     lovastatin (MEVACOR) 40 MG tablet Take 40 mg by mouth at bedtime.      methocarbamol (ROBAXIN) 500 MG tablet Take 1 tablet (500 mg total) by mouth every 6 (six) hours as needed for muscle spasms. (Patient taking differently: Take 250 mg by mouth See admin instructions. Take 250 mg by mouth three to four times a day) 60 tablet 0   metoprolol tartrate (LOPRESSOR) 25 MG tablet Take 0.5 tablets (12.5 mg total) by mouth 2 (two) times daily. 60 tablet 2   Multiple Vitamins-Minerals (CENTRUM SILVER 50+WOMEN) TABS Take 1 tablet by mouth daily with breakfast.     naloxone (NARCAN) nasal spray 4 mg/0.1 mL Place 1 spray into the nose daily as needed (accidental overdose).     nitroGLYCERIN (NITROSTAT) 0.4 MG SL tablet Place 0.4 mg under the tongue every 5 (five) minutes as needed for chest pain.     NONFORMULARY OR COMPOUNDED ITEM Please evaluate and treat for outpatient PT Diagnoses:  Deconditioning due to Acute Hypoxia/Shock from saddle embolus 1 each 0   pantoprazole (PROTONIX) 40 MG tablet Take 1 tablet (40 mg total) by mouth daily. 30 tablet 2   PROAIR HFA 108 (90 Base) MCG/ACT inhaler Inhale 2 puffs into the lungs every 4 (four) hours as needed for wheezing or shortness of breath.     RYBELSUS 3 MG TABS Take 3 mg by mouth daily before breakfast.     triamcinolone ointment (KENALOG) 0.5 % Apply 1 Application topically See admin instructions. Apply to affected areas of both feet two to three times a week     trimethoprim (TRIMPEX) 100 MG tablet Take 100 mg by mouth in the morning.     amoxicillin-clavulanate (AUGMENTIN) 875-125 MG tablet Take 1 tablet by mouth 2 (two) times daily. 14 tablet 0   No facility-administered medications prior to visit.    Review of Systems  Constitutional:  Negative for chills, fever, malaise/fatigue and weight loss.  HENT:  Negative for congestion, sinus pain and sore throat.   Eyes: Negative.   Respiratory:  Negative for cough, hemoptysis, sputum production, shortness of breath and wheezing.   Cardiovascular:  Positive for leg swelling. Negative for chest pain, palpitations, orthopnea and claudication.  Gastrointestinal:  Negative for abdominal pain, heartburn, nausea and vomiting.  Genitourinary: Negative.   Musculoskeletal:  Positive for back pain. Negative for joint pain and myalgias.  Skin:  Negative for rash.  Neurological:  Negative for weakness.  Endo/Heme/Allergies: Negative.   Psychiatric/Behavioral: Negative.        Objective:   Vitals:   12/31/21 1135  BP: 118/74  Pulse: 84  SpO2: 98%  Weight: 162 lb (73.5 kg)  Height: '4\' 8"'$  (1.422 m)     Physical Exam Constitutional:      General: She is not in acute distress.    Appearance: She is obese. She is not ill-appearing.  HENT:     Head: Normocephalic and atraumatic.  Eyes:     General: No scleral icterus.    Conjunctiva/sclera: Conjunctivae normal.     Pupils:  Pupils are equal, round, and reactive to light.  Cardiovascular:     Rate and Rhythm: Normal rate and regular rhythm.     Pulses: Normal pulses.     Heart sounds: Normal heart sounds. No murmur heard. Pulmonary:     Effort: Pulmonary  effort is normal.     Breath sounds: Normal breath sounds. No wheezing, rhonchi or rales.  Abdominal:     General: Bowel sounds are normal.     Palpations: Abdomen is soft.  Musculoskeletal:     Right lower leg: Edema present.     Left lower leg: Edema present.  Lymphadenopathy:     Cervical: No cervical adenopathy.  Skin:    General: Skin is warm and dry.  Neurological:     General: No focal deficit present.     Mental Status: She is alert.  Psychiatric:        Mood and Affect: Mood normal.        Behavior: Behavior normal.        Thought Content: Thought content normal.        Judgment: Judgment normal.       CBC    Component Value Date/Time   WBC 5.7 11/24/2021 1031   RBC 3.84 (L) 11/24/2021 1031   HGB 12.1 11/24/2021 1031   HCT 37.0 11/24/2021 1031   PLT 133 (L) 11/24/2021 1031   MCV 96.4 11/24/2021 1031   MCH 31.5 11/24/2021 1031   MCHC 32.7 11/24/2021 1031   RDW 14.2 11/24/2021 1031   LYMPHSABS 1.7 11/21/2021 0750   MONOABS 0.6 11/21/2021 0750   EOSABS 0.2 11/21/2021 0750   BASOSABS 0.0 11/21/2021 0750     Chest imaging: CTA Chest 11/21/21 1. Redemonstration of saddle pulmonary embolus. Positive for acute PE with CT evidence of right heart strain (RV/LV Ratio = 2.2) consistent with at least submassive (intermediate risk) PE. The presence of right heart strain has been associated with an increased risk of morbidity and mortality. Please refer to the "Code PE Focused" order set in EPIC. 2. Aortic atherosclerosis (ICD10-I70.0), coronary artery atherosclerosis and emphysema  PFT:     No data to display          Labs:  Path:  Echo 11/21/21: EF 60-65%. Interventricular septal flattening. RV systolic function is  moderately reduced and moderately enlarged. Severely elevated PA pressure 64.11mHg.   Heart Catheterization:       Assessment & Plan:   Acute saddle pulmonary embolism with acute cor pulmonale (HCC) - Plan: ECHOCARDIOGRAM LIMITED  Discussion: Lisa Filiceis a 76year old woman, former smoker with CKD, COPD, DMII and cauda equina compression s/p L2-L3 laminectomy with facetectomy and placement of anterior interbody device 08/2021 who was admitted 11/21/21 for massive saddle pulmonary embolus s/p mechanical thrombectomy by interventional radiology.   She has done well on therapeutic Eliquis without bleeding issues today.  She is to continue Eliquis 5 mg twice daily.  We will repeat an echocardiogram in 2 months to monitor her right heart function.  We will likely plan to continue her anticoagulation for at least 3 months of therapy.  She will remain high risk for recurrent blood clots given her significant immobility due to her back issues.  We will continue to assess her risks and benefits of anticoagulation.  Follow-up in 2 months after echocardiogram.  JFreda Jackson MD LCharl[facility] HarborP[facility] & Critical Care Office: [phone]  Current Outpatient Medications:    apixaban (ELIQUIS) 5 MG TABS tablet, Take 1 tablet (5 mg total) by mouth 2 (two) times daily., Disp: 60 tablet, Rfl: 12   Coenzyme Q10 (CO Q-10) 200 MG CAPS, Take 200 mg by mouth in the morning., Disp: , Rfl:    Cyanocobalamin (VITAMIN B-12) 500 MCG SUBL, Place 500 mcg under the tongue  in the morning., Disp: , Rfl:    diphenhydrAMINE (BENADRYL) 25 MG tablet, Take 25 mg by mouth in the morning., Disp: , Rfl:    docusate sodium (COLACE) 100 MG capsule, Take 100 mg by mouth daily., Disp: , Rfl:    fluticasone furoate-vilanterol (BREO ELLIPTA) 100-25 MCG/ACT AEPB, Inhale 1-2 puffs into the lungs daily., Disp: , Rfl:    gabapentin (NEURONTIN) 600 MG tablet, Take 600 mg by mouth at bedtime., Disp: , Rfl:     HYDROcodone-acetaminophen (NORCO) 10-325 MG tablet, Take 0.5 tablets by mouth 4 (four) times daily., Disp: , Rfl:    Iron, Ferrous Sulfate, 325 (65 Fe) MG TABS, Take 65 mg of iron by mouth daily with breakfast., Disp: , Rfl:    latanoprost (XALATAN) 0.005 % ophthalmic solution, Place 1 drop into both eyes at bedtime., Disp: , Rfl:    lovastatin (MEVACOR) 40 MG tablet, Take 40 mg by mouth at bedtime. , Disp: , Rfl:    methocarbamol (ROBAXIN) 500 MG tablet, Take 1 tablet (500 mg total) by mouth every 6 (six) hours as needed for muscle spasms. (Patient taking differently: Take 250 mg by mouth See admin instructions. Take 250 mg by mouth three to four times a day), Disp: 60 tablet, Rfl: 0   metoprolol tartrate (LOPRESSOR) 25 MG tablet, Take 0.5 tablets (12.5 mg total) by mouth 2 (two) times daily., Disp: 60 tablet, Rfl: 2   Multiple Vitamins-Minerals (CENTRUM SILVER 50+WOMEN) TABS, Take 1 tablet by mouth daily with breakfast., Disp: , Rfl:    naloxone (NARCAN) nasal spray 4 mg/0.1 mL, Place 1 spray into the nose daily as needed (accidental overdose)., Disp: , Rfl:    nitroGLYCERIN (NITROSTAT) 0.4 MG SL tablet, Place 0.4 mg under the tongue every 5 (five) minutes as needed for chest pain., Disp: , Rfl:    NONFORMULARY OR COMPOUNDED ITEM, Please evaluate and treat for outpatient PT Diagnoses: Deconditioning due to Acute Hypoxia/Shock from saddle embolus, Disp: 1 each, Rfl: 0   pantoprazole (PROTONIX) 40 MG tablet, Take 1 tablet (40 mg total) by mouth daily., Disp: 30 tablet, Rfl: 2   PROAIR HFA 108 (90 Base) MCG/ACT inhaler, Inhale 2 puffs into the lungs every 4 (four) hours as needed for wheezing or shortness of breath., Disp: , Rfl:    RYBELSUS 3 MG TABS, Take 3 mg by mouth daily before breakfast., Disp: , Rfl:    triamcinolone ointment (KENALOG) 0.5 %, Apply 1 Application topically See admin instructions. Apply to affected areas of both feet two to three times a week, Disp: , Rfl:    trimethoprim  (TRIMPEX) 100 MG tablet, Take 100 mg by mouth in the morning., Disp: , Rfl:

## 2022-01-06 DIAGNOSIS — M21371 Foot drop, right foot: Secondary | ICD-10-CM | POA: Diagnosis not present

## 2022-01-07 DIAGNOSIS — Z981 Arthrodesis status: Secondary | ICD-10-CM | POA: Diagnosis not present

## 2022-01-07 DIAGNOSIS — R2689 Other abnormalities of gait and mobility: Secondary | ICD-10-CM | POA: Diagnosis not present

## 2022-01-07 DIAGNOSIS — M5459 Other low back pain: Secondary | ICD-10-CM | POA: Diagnosis not present

## 2022-01-07 DIAGNOSIS — M21371 Foot drop, right foot: Secondary | ICD-10-CM | POA: Diagnosis not present

## 2022-01-12 DIAGNOSIS — M5459 Other low back pain: Secondary | ICD-10-CM | POA: Diagnosis not present

## 2022-01-12 DIAGNOSIS — Z981 Arthrodesis status: Secondary | ICD-10-CM | POA: Diagnosis not present

## 2022-01-12 DIAGNOSIS — R2689 Other abnormalities of gait and mobility: Secondary | ICD-10-CM | POA: Diagnosis not present

## 2022-01-12 DIAGNOSIS — M21371 Foot drop, right foot: Secondary | ICD-10-CM | POA: Diagnosis not present

## 2022-01-12 DIAGNOSIS — Z23 Encounter for immunization: Secondary | ICD-10-CM | POA: Diagnosis not present

## 2022-01-14 DIAGNOSIS — M21371 Foot drop, right foot: Secondary | ICD-10-CM | POA: Diagnosis not present

## 2022-01-14 DIAGNOSIS — R2689 Other abnormalities of gait and mobility: Secondary | ICD-10-CM | POA: Diagnosis not present

## 2022-01-14 DIAGNOSIS — M5459 Other low back pain: Secondary | ICD-10-CM | POA: Diagnosis not present

## 2022-01-14 DIAGNOSIS — Z981 Arthrodesis status: Secondary | ICD-10-CM | POA: Diagnosis not present

## 2022-01-19 DIAGNOSIS — R2689 Other abnormalities of gait and mobility: Secondary | ICD-10-CM | POA: Diagnosis not present

## 2022-01-19 DIAGNOSIS — M5459 Other low back pain: Secondary | ICD-10-CM | POA: Diagnosis not present

## 2022-01-19 DIAGNOSIS — M21371 Foot drop, right foot: Secondary | ICD-10-CM | POA: Diagnosis not present

## 2022-01-19 DIAGNOSIS — Z981 Arthrodesis status: Secondary | ICD-10-CM | POA: Diagnosis not present

## 2022-01-22 DIAGNOSIS — M21371 Foot drop, right foot: Secondary | ICD-10-CM | POA: Diagnosis not present

## 2022-01-22 DIAGNOSIS — R2689 Other abnormalities of gait and mobility: Secondary | ICD-10-CM | POA: Diagnosis not present

## 2022-01-22 DIAGNOSIS — Z981 Arthrodesis status: Secondary | ICD-10-CM | POA: Diagnosis not present

## 2022-01-22 DIAGNOSIS — M5459 Other low back pain: Secondary | ICD-10-CM | POA: Diagnosis not present

## 2022-01-27 DIAGNOSIS — M5459 Other low back pain: Secondary | ICD-10-CM | POA: Diagnosis not present

## 2022-01-27 DIAGNOSIS — Z981 Arthrodesis status: Secondary | ICD-10-CM | POA: Diagnosis not present

## 2022-01-27 DIAGNOSIS — M21371 Foot drop, right foot: Secondary | ICD-10-CM | POA: Diagnosis not present

## 2022-01-27 DIAGNOSIS — R2689 Other abnormalities of gait and mobility: Secondary | ICD-10-CM | POA: Diagnosis not present

## 2022-01-29 DIAGNOSIS — M5459 Other low back pain: Secondary | ICD-10-CM | POA: Diagnosis not present

## 2022-01-29 DIAGNOSIS — M21371 Foot drop, right foot: Secondary | ICD-10-CM | POA: Diagnosis not present

## 2022-01-29 DIAGNOSIS — Z981 Arthrodesis status: Secondary | ICD-10-CM | POA: Diagnosis not present

## 2022-01-29 DIAGNOSIS — R2689 Other abnormalities of gait and mobility: Secondary | ICD-10-CM | POA: Diagnosis not present

## 2022-02-03 DIAGNOSIS — B351 Tinea unguium: Secondary | ICD-10-CM | POA: Insufficient documentation

## 2022-02-03 DIAGNOSIS — Z7984 Long term (current) use of oral hypoglycemic drugs: Secondary | ICD-10-CM | POA: Diagnosis not present

## 2022-02-03 DIAGNOSIS — L301 Dyshidrosis [pompholyx]: Secondary | ICD-10-CM | POA: Diagnosis not present

## 2022-02-03 DIAGNOSIS — E119 Type 2 diabetes mellitus without complications: Secondary | ICD-10-CM | POA: Diagnosis not present

## 2022-02-03 HISTORY — DX: Tinea unguium: B35.1

## 2022-02-04 DIAGNOSIS — M5459 Other low back pain: Secondary | ICD-10-CM | POA: Diagnosis not present

## 2022-02-04 DIAGNOSIS — M21371 Foot drop, right foot: Secondary | ICD-10-CM | POA: Diagnosis not present

## 2022-02-04 DIAGNOSIS — Z981 Arthrodesis status: Secondary | ICD-10-CM | POA: Diagnosis not present

## 2022-02-04 DIAGNOSIS — R2689 Other abnormalities of gait and mobility: Secondary | ICD-10-CM | POA: Diagnosis not present

## 2022-02-09 ENCOUNTER — Ambulatory Visit: Payer: Medicare HMO | Attending: Cardiology

## 2022-02-09 DIAGNOSIS — E782 Mixed hyperlipidemia: Secondary | ICD-10-CM | POA: Diagnosis not present

## 2022-02-09 DIAGNOSIS — I2602 Saddle embolus of pulmonary artery with acute cor pulmonale: Secondary | ICD-10-CM

## 2022-02-09 DIAGNOSIS — N183 Chronic kidney disease, stage 3 unspecified: Secondary | ICD-10-CM | POA: Diagnosis not present

## 2022-02-09 DIAGNOSIS — J449 Chronic obstructive pulmonary disease, unspecified: Secondary | ICD-10-CM | POA: Diagnosis not present

## 2022-02-09 DIAGNOSIS — D638 Anemia in other chronic diseases classified elsewhere: Secondary | ICD-10-CM | POA: Diagnosis not present

## 2022-02-09 DIAGNOSIS — I1310 Hypertensive heart and chronic kidney disease without heart failure, with stage 1 through stage 4 chronic kidney disease, or unspecified chronic kidney disease: Secondary | ICD-10-CM | POA: Diagnosis not present

## 2022-02-09 DIAGNOSIS — Z981 Arthrodesis status: Secondary | ICD-10-CM | POA: Diagnosis not present

## 2022-02-09 DIAGNOSIS — R2689 Other abnormalities of gait and mobility: Secondary | ICD-10-CM | POA: Diagnosis not present

## 2022-02-09 DIAGNOSIS — E1129 Type 2 diabetes mellitus with other diabetic kidney complication: Secondary | ICD-10-CM | POA: Diagnosis not present

## 2022-02-09 DIAGNOSIS — M21371 Foot drop, right foot: Secondary | ICD-10-CM | POA: Diagnosis not present

## 2022-02-09 DIAGNOSIS — M5459 Other low back pain: Secondary | ICD-10-CM | POA: Diagnosis not present

## 2022-02-09 DIAGNOSIS — Z6836 Body mass index (BMI) 36.0-36.9, adult: Secondary | ICD-10-CM | POA: Diagnosis not present

## 2022-02-09 LAB — ECHOCARDIOGRAM LIMITED: S' Lateral: 2.9 cm

## 2022-02-24 DIAGNOSIS — J8410 Pulmonary fibrosis, unspecified: Secondary | ICD-10-CM | POA: Diagnosis not present

## 2022-02-24 DIAGNOSIS — G4733 Obstructive sleep apnea (adult) (pediatric): Secondary | ICD-10-CM | POA: Diagnosis not present

## 2022-02-24 DIAGNOSIS — R911 Solitary pulmonary nodule: Secondary | ICD-10-CM | POA: Diagnosis not present

## 2022-02-24 DIAGNOSIS — J4530 Mild persistent asthma, uncomplicated: Secondary | ICD-10-CM | POA: Diagnosis not present

## 2022-02-25 DIAGNOSIS — R339 Retention of urine, unspecified: Secondary | ICD-10-CM | POA: Diagnosis not present

## 2022-02-25 DIAGNOSIS — N390 Urinary tract infection, site not specified: Secondary | ICD-10-CM | POA: Diagnosis not present

## 2022-03-02 ENCOUNTER — Other Ambulatory Visit: Payer: Medicare HMO

## 2022-03-03 ENCOUNTER — Ambulatory Visit: Payer: Medicare HMO | Admitting: Pulmonary Disease

## 2022-03-04 DIAGNOSIS — K449 Diaphragmatic hernia without obstruction or gangrene: Secondary | ICD-10-CM | POA: Diagnosis not present

## 2022-03-04 DIAGNOSIS — I700 Atherosclerosis of aorta: Secondary | ICD-10-CM | POA: Diagnosis not present

## 2022-03-04 DIAGNOSIS — J8410 Pulmonary fibrosis, unspecified: Secondary | ICD-10-CM | POA: Diagnosis not present

## 2022-03-04 DIAGNOSIS — I270 Primary pulmonary hypertension: Secondary | ICD-10-CM | POA: Diagnosis not present

## 2022-03-04 DIAGNOSIS — J439 Emphysema, unspecified: Secondary | ICD-10-CM | POA: Diagnosis not present

## 2022-03-04 DIAGNOSIS — J189 Pneumonia, unspecified organism: Secondary | ICD-10-CM | POA: Diagnosis not present

## 2022-03-04 DIAGNOSIS — J432 Centrilobular emphysema: Secondary | ICD-10-CM | POA: Diagnosis not present

## 2022-03-11 DIAGNOSIS — G4733 Obstructive sleep apnea (adult) (pediatric): Secondary | ICD-10-CM | POA: Diagnosis not present

## 2022-03-11 DIAGNOSIS — J8410 Pulmonary fibrosis, unspecified: Secondary | ICD-10-CM | POA: Diagnosis not present

## 2022-03-11 DIAGNOSIS — R911 Solitary pulmonary nodule: Secondary | ICD-10-CM | POA: Diagnosis not present

## 2022-03-11 DIAGNOSIS — J4530 Mild persistent asthma, uncomplicated: Secondary | ICD-10-CM | POA: Diagnosis not present

## 2022-03-12 DIAGNOSIS — N2889 Other specified disorders of kidney and ureter: Secondary | ICD-10-CM | POA: Diagnosis not present

## 2022-03-13 ENCOUNTER — Encounter: Payer: Self-pay | Admitting: Pulmonary Disease

## 2022-03-13 ENCOUNTER — Ambulatory Visit (INDEPENDENT_AMBULATORY_CARE_PROVIDER_SITE_OTHER): Payer: Medicare HMO | Admitting: Pulmonary Disease

## 2022-03-13 VITALS — BP 116/72 | HR 82 | Temp 97.8°F | Ht <= 58 in | Wt 151.4 lb

## 2022-03-13 DIAGNOSIS — J4531 Mild persistent asthma with (acute) exacerbation: Secondary | ICD-10-CM

## 2022-03-13 MED ORDER — PREDNISONE 10 MG PO TABS: ORAL_TABLET | ORAL | 0 refills | Status: AC

## 2022-03-13 MED ORDER — AZITHROMYCIN 250 MG PO TABS: ORAL_TABLET | ORAL | 0 refills | Status: DC

## 2022-03-13 NOTE — Patient Instructions (Addendum)
Start prednisone taper as prescribed  Start Zpak as prescribed  Recommend checking your blood sugars 2-3 times per day to monitor your blood sugars while on prednisone. - Call your primary care doctor if blood sugars are 300 and above.   Continue eliquis '5mg'$  twice daily  Call sooner if you are not better after steroid taper and antibiotics  Follow up in 6 months

## 2022-03-13 NOTE — Progress Notes (Signed)
Synopsis: Referred in September 2023 for hospital follow up for pulmonary embolus  Subjective:   PATIENT ID: Lisa Maxwell GENDER: female DOB: 21-Oct-1945, MRN: [id number]  HPI  Chief Complaint  Patient presents with   Follow-up    cough.  Pt states she has a cold virus today x 2 weeks.  Pt states she has had 2 cold virus in last 4 weeks    Lisa Maxwell is a 76 year old woman, former smoker with CKD, COPD, DMII and cauda equina compression s/p L2-L3 laminectomy with facetectomy and placement of anterior interbody device 08/2021 who was admitted 11/21/21 for massive saddle pulmonary embolus s/p mechanical thrombectomy by interventional radiology.   She reports having two colds since last visit. She currently is coughing up thick white mucous. She has post-nasal drainage and sinus congestion. No sore throat. She is using her CPAP at night and can lead to dry mouth/throat. She has wheezing. Reduced appetite over the past 2 weeks.   Initial OV 12/2021 She was discharged on eliquis '5mg'$  BID. She has history of atrial fibrillation and was not on anticoagulation given her fall risks.   She has been doing well since time of discharge.  She denies any chest pain or shortness of breath.  She denies any bleeding issues in her stools or urine.  She does have bilateral lower extremity edema that is overall improved.  Past Medical History:  Diagnosis Date   CAD (coronary artery disease) 11/04/2016   Cancer ([place])    skin cancer   Cerebrovascular disease    Chronic atrial fibrillation ([place]) 09/28/2018   Chronic bilateral low back pain without sciatica 03/08/2018   Chronic kidney disease    Chronic obstructive lung disease ([place]) 09/28/2018   Chronic pain syndrome 03/08/2018   Chronic renal failure 09/28/2018   Chronic, continuous use of opioids 03/04/2021   Last Assessment & Plan:  Formatting of this note might be different from the original. Patient and I have discussed the hazardous effects of  continued opiate pain medication usage. Risks and benefits of above medications including but not limited to possibility of respiratory depression, sedation, and even death were discussed with the patient who expressed an understanding.  Patient did not displ   CKD (chronic kidney disease) stage 3, GFR 30-59 ml/min (HCC)    Comprehensive diabetic foot examination, type 2 DM, encounter for ([place]) 04/29/2021   DDD (degenerative disc disease), lumbar 11/17/2018   Last Assessment & Plan:  Formatting of this note might be different from the original. See spinal stenosis plan   Diabetes ([place])    Diabetes mellitus due to underlying condition with unspecified complications ([place]) 34/19/3790   Dyshidrotic eczema 04/29/2021   Dysrhythmia    afib   Essential hypertension 11/04/2016   Facet arthritis of lumbar region 03/08/2018   Facet joint disease 11/17/2018   Last Assessment & Plan:  Formatting of this note might be different from the original. See spinal stenosis plan   Foraminal stenosis of lumbar region 03/08/2018   Heart murmur    History of knee replacement, total, bilateral 11/17/2018   Last Assessment & Plan:  Formatting of this note might be different from the original. Have recommended to her several times if any further concerns in regards to the pain status post arthroplasty, she is to follow up with her surgeon.   Hypertension    Hypertensive disorder 09/28/2018   Increased body mass index 09/28/2018   Lumbar radiculopathy 03/08/2018   Lumbar spondylosis 03/20/2019  Mixed dyslipidemia 11/04/2016   Morbid obesity ([place]) 11/04/2016   Muscle pain 04/29/2020   Osteoarthritis of left knee    PAF (paroxysmal atrial fibrillation) ([place]) 04/30/2015   Pain management contract agreement 04/13/2019   Last Assessment & Plan:  Formatting of this note might be different from the original. Contract updated today.  UDS completed.  [place] controlled substance registry reviewed and is consistent  with her regimen.   Peripheral vascular disease ([place]) 09/28/2018   Preop cardiovascular exam 05/01/2019   Pulmonary emboli [facility])    Renal insufficiency 04/30/2015   S/P insertion of spinal cord stimulator 07/13/2019   Last Assessment & Plan:  Formatting of this note might be different from the original. About 5 months status post insertion of spinal cord stimulator with excellent relief.  She continues to keep close follow-up with her representative and has been working to appropriately find best programs to get her adequate pain relief.   Sleep apnea    Smoker 09/28/2018   Spinal stenosis of lumbar region 03/20/2019   Spinal stenosis of lumbar region with neurogenic claudication 03/08/2018   Last Assessment & Plan:  Formatting of this note is different from the original. 76 year old female with chronic low back pain and bilateral, right greater than left, L4-5 radicular pain, and claudication. She has substantial amount of multifactorial degenerative thoracic and lumbar spine pathology.  After being deemed an inappropriate open surgical candidate, and failing to respond to multiple in   Spondylolisthesis 03/20/2019   Spondylosis of lumbosacral region without myelopathy or radiculopathy 01/30/2019   Last Assessment & Plan:  Formatting of this note might be different from the original. See spinal stenosis plan   Type 2 diabetes mellitus ([place]) 09/28/2018     Family History  Problem Relation Age of Onset   Diabetes Mother      Social History   Socioeconomic History   Marital status: Married    Spouse name: Not on file   Number of children: Not on file   Years of education: Not on file   Highest education level: Not on file  Occupational History   Not on file  Tobacco Use   Smoking status: Former    Types: Cigarettes    Quit date: 05/08/2019    Years since quitting: 2.8   Smokeless tobacco: Never  Vaping Use   Vaping Use: Never used  Substance and Sexual Activity   Alcohol use: No    Drug use: No   Sexual activity: Not on file  Other Topics Concern   Not on file  Social History Narrative   Not on file   Social Determinants of Health   Financial Resource Strain: Not on file  Food Insecurity: Not on file  Transportation Needs: Not on file  Physical Activity: Not on file  Stress: Not on file  Social Connections: Not on file  Intimate Partner Violence: Not on file     No Known Allergies   Outpatient Medications Prior to Visit  Medication Sig Dispense Refill   apixaban (ELIQUIS) 5 MG TABS tablet Take 1 tablet (5 mg total) by mouth 2 (two) times daily. 60 tablet 12   Coenzyme Q10 (CO Q-10) 200 MG CAPS Take 200 mg by mouth in the morning.     Cyanocobalamin (VITAMIN B-12) 500 MCG SUBL Place 500 mcg under the tongue in the morning.     diphenhydrAMINE (BENADRYL) 25 MG tablet Take 25 mg by mouth in the morning.     docusate sodium (COLACE)  100 MG capsule Take 100 mg by mouth daily.     fluticasone furoate-vilanterol (BREO ELLIPTA) 100-25 MCG/ACT AEPB Inhale 1-2 puffs into the lungs daily.     gabapentin (NEURONTIN) 600 MG tablet Take 600 mg by mouth at bedtime.     HYDROcodone-acetaminophen (NORCO) 10-325 MG tablet Take 0.5 tablets by mouth 4 (four) times daily.     Iron, Ferrous Sulfate, 325 (65 Fe) MG TABS Take 65 mg of iron by mouth daily with breakfast.     latanoprost (XALATAN) 0.005 % ophthalmic solution Place 1 drop into both eyes at bedtime.     lovastatin (MEVACOR) 40 MG tablet Take 40 mg by mouth at bedtime.      methocarbamol (ROBAXIN) 500 MG tablet Take 1 tablet (500 mg total) by mouth every 6 (six) hours as needed for muscle spasms. (Patient taking differently: Take 250 mg by mouth See admin instructions. Take 250 mg by mouth three to four times a day) 60 tablet 0   metoprolol tartrate (LOPRESSOR) 25 MG tablet Take 0.5 tablets (12.5 mg total) by mouth 2 (two) times daily. 60 tablet 2   Multiple Vitamins-Minerals (CENTRUM SILVER 50+WOMEN) TABS Take 1  tablet by mouth daily with breakfast.     naloxone (NARCAN) nasal spray 4 mg/0.1 mL Place 1 spray into the nose daily as needed (accidental overdose).     nitroGLYCERIN (NITROSTAT) 0.4 MG SL tablet Place 0.4 mg under the tongue every 5 (five) minutes as needed for chest pain.     NONFORMULARY OR COMPOUNDED ITEM Please evaluate and treat for outpatient PT Diagnoses: Deconditioning due to Acute Hypoxia/Shock from saddle embolus 1 each 0   pantoprazole (PROTONIX) 40 MG tablet Take 1 tablet (40 mg total) by mouth daily. 30 tablet 2   PROAIR HFA 108 (90 Base) MCG/ACT inhaler Inhale 2 puffs into the lungs every 4 (four) hours as needed for wheezing or shortness of breath.     RYBELSUS 3 MG TABS Take 3 mg by mouth daily before breakfast.     triamcinolone ointment (KENALOG) 0.5 % Apply 1 Application topically See admin instructions. Apply to affected areas of both feet two to three times a week     trimethoprim (TRIMPEX) 100 MG tablet Take 100 mg by mouth in the morning.     No facility-administered medications prior to visit.   Review of Systems  Constitutional:  Negative for chills, fever, malaise/fatigue and weight loss.  HENT:  Negative for congestion, sinus pain and sore throat.   Eyes: Negative.   Respiratory:  Positive for cough, sputum production, shortness of breath and wheezing. Negative for hemoptysis.   Cardiovascular:  Positive for leg swelling. Negative for chest pain, palpitations, orthopnea and claudication.  Gastrointestinal:  Negative for abdominal pain, heartburn, nausea and vomiting.  Genitourinary: Negative.   Musculoskeletal:  Positive for back pain. Negative for joint pain and myalgias.  Skin:  Negative for rash.  Neurological:  Negative for weakness.  Endo/Heme/Allergies: Negative.   Psychiatric/Behavioral: Negative.     Objective:   Vitals:   03/13/22 1025  BP: 116/72  Pulse: 82  Temp: 97.8 F (36.6 C)  TempSrc: Oral  SpO2: 95%  Weight: 151 lb 6.4 oz (68.7 kg)   Height: '4\' 8"'$  (1.422 m)   Physical Exam Constitutional:      General: She is not in acute distress.    Appearance: She is obese. She is not ill-appearing.  HENT:     Head: Normocephalic and atraumatic.  Eyes:  General: No scleral icterus.    Conjunctiva/sclera: Conjunctivae normal.     Pupils: Pupils are equal, round, and reactive to light.  Cardiovascular:     Rate and Rhythm: Normal rate and regular rhythm.     Pulses: Normal pulses.     Heart sounds: Normal heart sounds. No murmur heard. Pulmonary:     Effort: Pulmonary effort is normal.     Breath sounds: Wheezing present. No rhonchi or rales.  Abdominal:     General: Bowel sounds are normal.     Palpations: Abdomen is soft.  Musculoskeletal:     Right lower leg: Edema present.     Left lower leg: Edema present.  Lymphadenopathy:     Cervical: No cervical adenopathy.  Skin:    General: Skin is warm and dry.  Neurological:     General: No focal deficit present.     Mental Status: She is alert.  Psychiatric:        Mood and Affect: Mood normal.        Behavior: Behavior normal.        Thought Content: Thought content normal.        Judgment: Judgment normal.    CBC    Component Value Date/Time   WBC 5.7 11/24/2021 1031   RBC 3.84 (L) 11/24/2021 1031   HGB 12.1 11/24/2021 1031   HCT 37.0 11/24/2021 1031   PLT 133 (L) 11/24/2021 1031   MCV 96.4 11/24/2021 1031   MCH 31.5 11/24/2021 1031   MCHC 32.7 11/24/2021 1031   RDW 14.2 11/24/2021 1031   LYMPHSABS 1.7 11/21/2021 0750   MONOABS 0.6 11/21/2021 0750   EOSABS 0.2 11/21/2021 0750   BASOSABS 0.0 11/21/2021 0750      Latest Ref Rng & Units 11/24/2021   10:31 AM 11/22/2021    4:50 AM 11/21/2021    7:50 AM  BMP  Glucose 70 - 99 mg/dL 160  132  157   BUN 8 - 23 mg/dL '18  21  25   '$ Creatinine 0.44 - 1.00 mg/dL 1.56  1.30  1.49   Sodium 135 - 145 mmol/L 137  137  136   Potassium 3.5 - 5.1 mmol/L 3.9  4.5  4.3   Chloride 98 - 111 mmol/L 110  112  106   CO2  22 - 32 mmol/L '22  18  18   '$ Calcium 8.9 - 10.3 mg/dL 9.7  8.9  10.0    Chest imaging: CTA Chest 11/21/21 1. Redemonstration of saddle pulmonary embolus. Positive for acute PE with CT evidence of right heart strain (RV/LV Ratio = 2.2) consistent with at least submassive (intermediate risk) PE. The presence of right heart strain has been associated with an increased risk of morbidity and mortality. Please refer to the "Code PE Focused" order set in EPIC. 2. Aortic atherosclerosis (ICD10-I70.0), coronary artery atherosclerosis and emphysema  PFT:     No data to display          Labs:  Path:  Echo 11/21/21: EF 60-65%. Interventricular septal flattening. RV systolic function is moderately reduced and moderately enlarged. Severely elevated PA pressure 64.9mHg.   Echo 02/09/22 LV EF 60-65%. RV size has decreased back to normal with normal RV function. LA is mildly dilated.   Heart Catheterization:  Assessment & Plan:   Mild persistent asthma with acute exacerbation - Plan: predniSONE (DELTASONE) 10 MG tablet, azithromycin (ZITHROMAX) 250 MG tablet  Discussion: Lisa Skiltonis a 76year old woman, former smoker with  CKD, COPD, DMII and cauda equina compression s/p L2-L3 laminectomy with facetectomy and placement of anterior interbody device 08/2021 who was admitted 11/21/21 for massive saddle pulmonary embolus s/p mechanical thrombectomy by interventional radiology.   She has done well on therapeutic Eliquis without bleeding issues.  She is to continue Eliquis 5 mg twice daily.  Her cardiac function has returned to normal on follow up echo. She is to continue anticoagulation for at least 6 months and at follow up visit we will determine if we transition her to low dose eliquis.  We will start her on prednisone taper and Zpak for asthma exacerbation. She is to monitor her blood sugars with starting the prednisone.   Follow-up in 6 months.   Freda Jackson, MD [facility] Office: [phone]   Current Outpatient Medications:    apixaban (ELIQUIS) 5 MG TABS tablet, Take 1 tablet (5 mg total) by mouth 2 (two) times daily., Disp: 60 tablet, Rfl: 12   azithromycin (ZITHROMAX) 250 MG tablet, Take as directed, Disp: 6 tablet, Rfl: 0   Coenzyme Q10 (CO Q-10) 200 MG CAPS, Take 200 mg by mouth in the morning., Disp: , Rfl:    Cyanocobalamin (VITAMIN B-12) 500 MCG SUBL, Place 500 mcg under the tongue in the morning., Disp: , Rfl:    diphenhydrAMINE (BENADRYL) 25 MG tablet, Take 25 mg by mouth in the morning., Disp: , Rfl:    docusate sodium (COLACE) 100 MG capsule, Take 100 mg by mouth daily., Disp: , Rfl:    fluticasone furoate-vilanterol (BREO ELLIPTA) 100-25 MCG/ACT AEPB, Inhale 1-2 puffs into the lungs daily., Disp: , Rfl:    gabapentin (NEURONTIN) 600 MG tablet, Take 600 mg by mouth at bedtime., Disp: , Rfl:    HYDROcodone-acetaminophen (NORCO) 10-325 MG tablet, Take 0.5 tablets by mouth 4 (four) times daily., Disp: , Rfl:    Iron, Ferrous Sulfate, 325 (65 Fe) MG TABS, Take 65 mg of iron by mouth daily with breakfast., Disp: , Rfl:    latanoprost (XALATAN) 0.005 % ophthalmic solution, Place 1 drop into both eyes at bedtime., Disp: , Rfl:    lovastatin (MEVACOR) 40 MG tablet, Take 40 mg by mouth at bedtime. , Disp: , Rfl:    methocarbamol (ROBAXIN) 500 MG tablet, Take 1 tablet (500 mg total) by mouth every 6 (six) hours as needed for muscle spasms. (Patient taking differently: Take 250 mg by mouth See admin instructions. Take 250 mg by mouth three to four times a day), Disp: 60 tablet, Rfl: 0   metoprolol tartrate (LOPRESSOR) 25 MG tablet, Take 0.5 tablets (12.5 mg total) by mouth 2 (two) times daily., Disp: 60 tablet, Rfl: 2   Multiple Vitamins-Minerals (CENTRUM SILVER 50+WOMEN) TABS, Take 1 tablet by mouth daily with breakfast., Disp: , Rfl:    naloxone (NARCAN) nasal spray 4 mg/0.1 mL, Place 1 spray into the nose daily as needed (accidental overdose).,  Disp: , Rfl:    nitroGLYCERIN (NITROSTAT) 0.4 MG SL tablet, Place 0.4 mg under the tongue every 5 (five) minutes as needed for chest pain., Disp: , Rfl:    NONFORMULARY OR COMPOUNDED ITEM, Please evaluate and treat for outpatient PT Diagnoses: Deconditioning due to Acute Hypoxia/Shock from saddle embolus, Disp: 1 each, Rfl: 0   pantoprazole (PROTONIX) 40 MG tablet, Take 1 tablet (40 mg total) by mouth daily., Disp: 30 tablet, Rfl: 2   predniSONE (DELTASONE) 10 MG tablet, Take 4 tablets (40 mg total) by mouth daily with breakfast for 3 days,  THEN 3 tablets (30 mg total) daily with breakfast for 3 days, THEN 2 tablets (20 mg total) daily with breakfast for 3 days, THEN 1 tablet (10 mg total) daily with breakfast for 3 days., Disp: 30 tablet, Rfl: 0   PROAIR HFA 108 (90 Base) MCG/ACT inhaler, Inhale 2 puffs into the lungs every 4 (four) hours as needed for wheezing or shortness of breath., Disp: , Rfl:    RYBELSUS 3 MG TABS, Take 3 mg by mouth daily before breakfast., Disp: , Rfl:    triamcinolone ointment (KENALOG) 0.5 %, Apply 1 Application topically See admin instructions. Apply to affected areas of both feet two to three times a week, Disp: , Rfl:    trimethoprim (TRIMPEX) 100 MG tablet, Take 100 mg by mouth in the morning., Disp: , Rfl:

## 2022-03-18 DIAGNOSIS — G8929 Other chronic pain: Secondary | ICD-10-CM | POA: Diagnosis not present

## 2022-03-18 DIAGNOSIS — Z79891 Long term (current) use of opiate analgesic: Secondary | ICD-10-CM | POA: Diagnosis not present

## 2022-03-18 DIAGNOSIS — Z9689 Presence of other specified functional implants: Secondary | ICD-10-CM | POA: Diagnosis not present

## 2022-03-18 DIAGNOSIS — M48062 Spinal stenosis, lumbar region with neurogenic claudication: Secondary | ICD-10-CM | POA: Diagnosis not present

## 2022-03-18 DIAGNOSIS — Z79899 Other long term (current) drug therapy: Secondary | ICD-10-CM | POA: Diagnosis not present

## 2022-03-18 DIAGNOSIS — E6609 Other obesity due to excess calories: Secondary | ICD-10-CM | POA: Diagnosis not present

## 2022-03-18 DIAGNOSIS — M5136 Other intervertebral disc degeneration, lumbar region: Secondary | ICD-10-CM | POA: Diagnosis not present

## 2022-03-18 DIAGNOSIS — Z6834 Body mass index (BMI) 34.0-34.9, adult: Secondary | ICD-10-CM | POA: Diagnosis not present

## 2022-03-18 DIAGNOSIS — Z760 Encounter for issue of repeat prescription: Secondary | ICD-10-CM | POA: Diagnosis not present

## 2022-03-24 DIAGNOSIS — H40013 Open angle with borderline findings, low risk, bilateral: Secondary | ICD-10-CM | POA: Diagnosis not present

## 2022-05-13 DIAGNOSIS — E1122 Type 2 diabetes mellitus with diabetic chronic kidney disease: Secondary | ICD-10-CM | POA: Diagnosis not present

## 2022-05-13 DIAGNOSIS — D638 Anemia in other chronic diseases classified elsewhere: Secondary | ICD-10-CM | POA: Diagnosis not present

## 2022-05-13 DIAGNOSIS — N183 Chronic kidney disease, stage 3 unspecified: Secondary | ICD-10-CM | POA: Diagnosis not present

## 2022-05-13 DIAGNOSIS — Z6835 Body mass index (BMI) 35.0-35.9, adult: Secondary | ICD-10-CM | POA: Diagnosis not present

## 2022-05-13 DIAGNOSIS — I1310 Hypertensive heart and chronic kidney disease without heart failure, with stage 1 through stage 4 chronic kidney disease, or unspecified chronic kidney disease: Secondary | ICD-10-CM | POA: Diagnosis not present

## 2022-05-13 DIAGNOSIS — E782 Mixed hyperlipidemia: Secondary | ICD-10-CM | POA: Diagnosis not present

## 2022-05-13 DIAGNOSIS — N2889 Other specified disorders of kidney and ureter: Secondary | ICD-10-CM | POA: Diagnosis not present

## 2022-05-13 DIAGNOSIS — I4891 Unspecified atrial fibrillation: Secondary | ICD-10-CM | POA: Diagnosis not present

## 2022-05-15 DIAGNOSIS — J342 Deviated nasal septum: Secondary | ICD-10-CM | POA: Diagnosis not present

## 2022-05-15 DIAGNOSIS — R0981 Nasal congestion: Secondary | ICD-10-CM | POA: Diagnosis not present

## 2022-05-15 DIAGNOSIS — J343 Hypertrophy of nasal turbinates: Secondary | ICD-10-CM | POA: Diagnosis not present

## 2022-05-28 DIAGNOSIS — I2699 Other pulmonary embolism without acute cor pulmonale: Secondary | ICD-10-CM | POA: Insufficient documentation

## 2022-05-28 DIAGNOSIS — Z86711 Personal history of pulmonary embolism: Secondary | ICD-10-CM

## 2022-05-28 HISTORY — DX: Personal history of pulmonary embolism: Z86.711

## 2022-06-02 DIAGNOSIS — M549 Dorsalgia, unspecified: Secondary | ICD-10-CM | POA: Diagnosis not present

## 2022-06-02 DIAGNOSIS — G834 Cauda equina syndrome: Secondary | ICD-10-CM | POA: Diagnosis not present

## 2022-06-02 DIAGNOSIS — G8929 Other chronic pain: Secondary | ICD-10-CM | POA: Diagnosis not present

## 2022-06-03 ENCOUNTER — Encounter: Payer: Self-pay | Admitting: Cardiology

## 2022-06-03 ENCOUNTER — Ambulatory Visit: Payer: Medicare HMO | Attending: Cardiology | Admitting: Cardiology

## 2022-06-03 VITALS — BP 116/80 | HR 70 | Ht <= 58 in | Wt 157.8 lb

## 2022-06-03 DIAGNOSIS — I10 Essential (primary) hypertension: Secondary | ICD-10-CM | POA: Diagnosis not present

## 2022-06-03 DIAGNOSIS — E782 Mixed hyperlipidemia: Secondary | ICD-10-CM | POA: Diagnosis not present

## 2022-06-03 DIAGNOSIS — I2510 Atherosclerotic heart disease of native coronary artery without angina pectoris: Secondary | ICD-10-CM

## 2022-06-03 DIAGNOSIS — E6609 Other obesity due to excess calories: Secondary | ICD-10-CM

## 2022-06-03 DIAGNOSIS — E088 Diabetes mellitus due to underlying condition with unspecified complications: Secondary | ICD-10-CM

## 2022-06-03 DIAGNOSIS — I480 Paroxysmal atrial fibrillation: Secondary | ICD-10-CM

## 2022-06-03 DIAGNOSIS — Z6834 Body mass index (BMI) 34.0-34.9, adult: Secondary | ICD-10-CM

## 2022-06-03 DIAGNOSIS — J431 Panlobular emphysema: Secondary | ICD-10-CM

## 2022-06-03 NOTE — Patient Instructions (Signed)

## 2022-06-03 NOTE — Progress Notes (Signed)
Cardiology Office Note:    Date:  06/03/2022   ID:  Lisa Maxwell, Lisa Maxwell 12-06-1945, MRN [id number]  PCP:  Nicoletta Dress, MD  Cardiologist:  Jenean Lindau, MD   Referring MD: Nicoletta Dress, MD    ASSESSMENT:    1. Coronary artery disease involving native coronary artery of native heart without angina pectoris   2. Essential hypertension   3. PAF (paroxysmal atrial fibrillation) (HCC)   4. Panlobular emphysema ([place])   5. Diabetes mellitus due to underlying condition with unspecified complications ([place])   6. Mixed dyslipidemia   7. Class 1 obesity due to excess calories with serious comorbidity and body mass index (BMI) of 34.0 to 34.9 in adult    PLAN:    In order of problems listed above:  Aortic atherosclerosis: I discussed my findings with the patient.  She is aware of this. Atrial fibrillation:I discussed with the patient atrial fibrillation, disease process. Management and therapy including rate and rhythm control, anticoagulation benefits and potential risks were discussed extensively with the patient. Patient had multiple questions which were answered to patient's satisfaction. Essential hypertension: Blood pressure stable and diet was emphasized. Mixed dyslipidemia: On lipid-lowering medications.  Lipids reviewed from recent evaluation and discussed with the patient. History of PE: On anticoagulation. Diabetes melitis with morbid obesity: Weight reduction stressed risks of obesity explained and she promises to do better. Patient will be seen in follow-up appointment in 6 months or earlier if the patient has any concerns    Medication Adjustments/Labs and Tests Ordered: Current medicines are reviewed at length with the patient today.  Concerns regarding medicines are outlined above.  No orders of the defined types were placed in this encounter.  No orders of the defined types were placed in this encounter.    No chief complaint on file.    History of  Present Illness:    Lisa Maxwell is a 77 y.o. female.  Patient has past medical history of coronary artery disease, atrial fibrillation, renal insufficiency, essential hypertension, dyslipidemia, diabetes mellitus and aortic atherosclerosis.  She denies any problems at this time and takes care of activities of daily living.  I will ambulation is limited because of marked orthopedic issues involving her spine.  At the time of my evaluation, the patient is alert awake oriented and in no distress.  Past Medical History:  Diagnosis Date   CAD (coronary artery disease) 11/04/2016   Cancer ([place])    skin cancer   Cauda equina compression ([place]) 08/28/2021   Cerebrovascular disease    Chronic atrial fibrillation ([place]) 09/28/2018   Chronic bilateral low back pain without sciatica 03/08/2018   Chronic kidney disease    Chronic obstructive lung disease ([place]) 09/28/2018   Chronic pain syndrome 03/08/2018   Chronic renal failure 09/28/2018   Chronic, continuous use of opioids 03/04/2021   Last Assessment & Plan:  Formatting of this note might be different from the original. Patient and I have discussed the hazardous effects of continued opiate pain medication usage. Risks and benefits of above medications including but not limited to possibility of respiratory depression, sedation, and even death were discussed with the patient who expressed an understanding.  Patient did not displ   CKD (chronic kidney disease) stage 3, GFR 30-59 ml/min (HCC)    Class 1 obesity due to excess calories with serious comorbidity and body mass index (BMI) of 34.0 to 34.9 in adult 05/20/2021   Last Assessment & Plan: Formatting of this note  might be different from the original. Patient educated about the detrimental effects of weight as it specifically pertains to pain management and overall health.  Patient's BMI 34.08 Encouraged healthy eating habits and routine low-impact cardiovascular exercises as tolerated.    Comprehensive diabetic foot examination, type 2 DM, encounter for ([place]) 04/29/2021   DDD (degenerative disc disease), lumbar 11/17/2018   Last Assessment & Plan:  Formatting of this note might be different from the original. See spinal stenosis plan   Diabetes ([place])    Diabetes mellitus due to underlying condition with unspecified complications ([place]) [id number]   Dyshidrotic eczema 04/29/2021   Dysrhythmia    afib   Essential hypertension 11/04/2016   Facet arthritis of lumbar region 03/08/2018   Facet joint disease 11/17/2018   Last Assessment & Plan:  Formatting of this note might be different from the original. See spinal stenosis plan   Foraminal stenosis of lumbar region 03/08/2018   Heart murmur    History of knee replacement, total, bilateral 11/17/2018   Last Assessment & Plan:  Formatting of this note might be different from the original. Have recommended to her several times if any further concerns in regards to the pain status post arthroplasty, she is to follow up with her surgeon.   History of pulmonary embolism 11/28/2021   Hypertension    Hypertensive disorder 09/28/2018   Increased body mass index 09/28/2018   Lumbar radiculopathy 03/08/2018   Lumbar spondylosis 03/20/2019   Mixed dyslipidemia 11/04/2016   Morbid obesity ([place]) 11/04/2016   Muscle pain 04/29/2020   Osteoarthritis of left knee    PAF (paroxysmal atrial fibrillation) ([place]) 04/30/2015   Pain management contract agreement 04/13/2019   Last Assessment & Plan:  Formatting of this note might be different from the original. Contract updated today.  UDS completed.  [place] controlled substance registry reviewed and is consistent with her regimen.   Peripheral vascular disease ([place]) 09/28/2018   Preop cardiovascular exam 05/01/2019   Pressure injury of skin 11/21/2021   Pulmonary emboli [facility])    Pulmonary embolism ([place]) 11/21/2021   Renal insufficiency 04/30/2015   S/P insertion of spinal cord  stimulator 07/13/2019   Last Assessment & Plan:  Formatting of this note might be different from the original. About 5 months status post insertion of spinal cord stimulator with excellent relief.  She continues to keep close follow-up with her representative and has been working to appropriately find best programs to get her adequate pain relief.   Sleep apnea    Smoker 09/28/2018   Spinal stenosis of lumbar region 03/20/2019   Spinal stenosis of lumbar region with neurogenic claudication 03/08/2018   Last Assessment & Plan:  Formatting of this note is different from the original. 77 year old female with chronic low back pain and bilateral, right greater than left, L4-5 radicular pain, and claudication. She has substantial amount of multifactorial degenerative thoracic and lumbar spine pathology.  After being deemed an inappropriate open surgical candidate, and failing to respond to multiple in   Spondylolisthesis 03/20/2019   Spondylosis of lumbosacral region without myelopathy or radiculopathy 01/30/2019   Last Assessment & Plan:  Formatting of this note might be different from the original. See spinal stenosis plan   Tinea unguium 02/03/2022   Type 2 diabetes mellitus ([place]) 09/28/2018    Past Surgical History:  Procedure Laterality Date   ABDOMINAL HYSTERECTOMY     BACK SURGERY     EYE SURGERY     bilateral cataracts   IR  ANGIOGRAM PULMONARY BILATERAL SELECTIVE  11/21/2021   IR ANGIOGRAM SELECTIVE EACH ADDITIONAL VESSEL  11/21/2021   IR ANGIOGRAM SELECTIVE EACH ADDITIONAL VESSEL  11/21/2021   IR THROMBECT PRIM MECH INIT (INCLU) MOD SED  11/21/2021   IR US GUIDE VASC ACCESS RIGHT  11/21/2021   JOINT REPLACEMENT Bilateral    knees   RADIOLOGY WITH ANESTHESIA N/A 11/21/2021   Procedure: IR WITH ANESTHESIA;  Surgeon: Radiologist, Medication, MD;  Location: [facility];  Service: Radiology;  Laterality: N/A;   SPINAL CORD STIMULATOR INSERTION N/A 07/07/2019   Procedure: LUMBAR SPINAL CORD STIMULATOR  INSERTION;  Surgeon: Clydell Hakim, MD;  Location: [facility];  Service: Neurosurgery;  Laterality: N/A;   total left knee      Current Medications: Current Meds  Medication Sig   apixaban (ELIQUIS) 5 MG TABS tablet Take 1 tablet (5 mg total) by mouth 2 (two) times daily.   azithromycin (ZITHROMAX) 250 MG tablet Take as directed   Coenzyme Q10 (CO Q-10) 200 MG CAPS Take 200 mg by mouth in the morning.   Cyanocobalamin (VITAMIN B-12) 500 MCG SUBL Place 500 mcg under the tongue in the morning.   diphenhydrAMINE (BENADRYL) 25 MG tablet Take 25 mg by mouth in the morning.   docusate sodium (COLACE) 100 MG capsule Take 100 mg by mouth daily.   fluticasone furoate-vilanterol (BREO ELLIPTA) 100-25 MCG/ACT AEPB Inhale 1-2 puffs into the lungs daily.   gabapentin (NEURONTIN) 600 MG tablet Take 600 mg by mouth at bedtime.   HYDROcodone-acetaminophen (NORCO) 10-325 MG tablet Take 0.5 tablets by mouth 4 (four) times daily.   Iron, Ferrous Sulfate, 325 (65 Fe) MG TABS Take 65 mg of iron by mouth daily with breakfast.   latanoprost (XALATAN) 0.005 % ophthalmic solution Place 1 drop into both eyes at bedtime.   lovastatin (MEVACOR) 40 MG tablet Take 40 mg by mouth at bedtime.    methocarbamol (ROBAXIN) 500 MG tablet Take 1 tablet (500 mg total) by mouth every 6 (six) hours as needed for muscle spasms. (Patient taking differently: Take 250 mg by mouth See admin instructions. Take 250 mg by mouth three to four times a day)   metoprolol tartrate (LOPRESSOR) 25 MG tablet Take 0.5 tablets (12.5 mg total) by mouth 2 (two) times daily.   Multiple Vitamins-Minerals (CENTRUM SILVER 50+WOMEN) TABS Take 1 tablet by mouth daily with breakfast.   naloxone (NARCAN) nasal spray 4 mg/0.1 mL Place 1 spray into the nose daily as needed (accidental overdose).   nitroGLYCERIN (NITROSTAT) 0.4 MG SL tablet Place 0.4 mg under the tongue every 5 (five) minutes as needed for chest pain.   NONFORMULARY OR COMPOUNDED ITEM Please evaluate  and treat for outpatient PT Diagnoses: Deconditioning due to Acute Hypoxia/Shock from saddle embolus   pantoprazole (PROTONIX) 40 MG tablet Take 1 tablet (40 mg total) by mouth daily.   PROAIR HFA 108 (90 Base) MCG/ACT inhaler Inhale 2 puffs into the lungs every 4 (four) hours as needed for wheezing or shortness of breath.   RYBELSUS 3 MG TABS Take 3 mg by mouth daily before breakfast.   triamcinolone ointment (KENALOG) 0.5 % Apply 1 Application topically See admin instructions. Apply to affected areas of both feet two to three times a week   trimethoprim (TRIMPEX) 100 MG tablet Take 100 mg by mouth in the morning.     Allergies:   Patient has no known allergies.   Social History   Socioeconomic History   Marital status: Married    Spouse  name: Not on file   Number of children: Not on file   Years of education: Not on file   Highest education level: Not on file  Occupational History   Not on file  Tobacco Use   Smoking status: Former    Types: Cigarettes    Quit date: 05/08/2019    Years since quitting: 3.0   Smokeless tobacco: Never  Vaping Use   Vaping Use: Never used  Substance and Sexual Activity   Alcohol use: No   Drug use: No   Sexual activity: Not on file  Other Topics Concern   Not on file  Social History Narrative   Not on file   Social Determinants of Health   Financial Resource Strain: Not on file  Food Insecurity: Not on file  Transportation Needs: Not on file  Physical Activity: Not on file  Stress: Not on file  Social Connections: Not on file     Family History: The patient's family history includes Diabetes in her mother.  ROS:   Please see the history of present illness.    All other systems reviewed and are negative.  EKGs/Labs/Other Studies Reviewed:    The following studies were reviewed today: I discussed my findings with the patient at length.   Recent Labs: 11/21/2021: ALT 16; B Natriuretic Peptide 101.1 11/24/2021: BUN 18; Creatinine,  Ser 1.56; Hemoglobin 12.1; Platelets 133; Potassium 3.9; Sodium 137  Recent Lipid Panel No results found for: "CHOL", "TRIG", "HDL", "CHOLHDL", "VLDL", "LDLCALC", "LDLDIRECT"  Physical Exam:    VS:  BP 116/80   Pulse 70   Ht '4\' 8"'$  (1.422 m)   Wt 157 lb 12.8 oz (71.6 kg)   SpO2 94%   BMI 35.38 kg/m     Wt Readings from Last 3 Encounters:  06/03/22 157 lb 12.8 oz (71.6 kg)  03/13/22 151 lb 6.4 oz (68.7 kg)  12/31/21 162 lb (73.5 kg)     GEN: Patient is in no acute distress HEENT: Normal NECK: No JVD; No carotid bruits LYMPHATICS: No lymphadenopathy CARDIAC: Hear sounds regular, 2/6 systolic murmur at the apex. RESPIRATORY:  Clear to auscultation without rales, wheezing or rhonchi  ABDOMEN: Soft, non-tender, non-distended MUSCULOSKELETAL:  No edema; No deformity  SKIN: Warm and dry NEUROLOGIC:  Alert and oriented x 3 PSYCHIATRIC:  Normal affect   Signed, Jenean Lindau, MD  06/03/2022 1:12 PM    [facility]

## 2022-06-09 ENCOUNTER — Inpatient Hospital Stay (HOSPITAL_COMMUNITY)
Admission: EM | Admit: 2022-06-09 | Discharge: 2022-06-20 | DRG: 543 | Disposition: A | Discharge status: Skilled Nursing Facility | Payer: Medicare HMO | Attending: Internal Medicine | Admitting: Internal Medicine

## 2022-06-09 ENCOUNTER — Other Ambulatory Visit: Payer: Self-pay

## 2022-06-09 ENCOUNTER — Emergency Department (HOSPITAL_COMMUNITY): Payer: Medicare HMO

## 2022-06-09 DIAGNOSIS — I129 Hypertensive chronic kidney disease with stage 1 through stage 4 chronic kidney disease, or unspecified chronic kidney disease: Secondary | ICD-10-CM | POA: Diagnosis not present

## 2022-06-09 DIAGNOSIS — R630 Anorexia: Secondary | ICD-10-CM | POA: Diagnosis not present

## 2022-06-09 DIAGNOSIS — S0990XA Unspecified injury of head, initial encounter: Secondary | ICD-10-CM | POA: Diagnosis not present

## 2022-06-09 DIAGNOSIS — K319 Disease of stomach and duodenum, unspecified: Secondary | ICD-10-CM | POA: Diagnosis not present

## 2022-06-09 DIAGNOSIS — R531 Weakness: Principal | ICD-10-CM

## 2022-06-09 DIAGNOSIS — N189 Chronic kidney disease, unspecified: Secondary | ICD-10-CM | POA: Diagnosis not present

## 2022-06-09 DIAGNOSIS — W1830XA Fall on same level, unspecified, initial encounter: Secondary | ICD-10-CM | POA: Diagnosis present

## 2022-06-09 DIAGNOSIS — Z6835 Body mass index (BMI) 35.0-35.9, adult: Secondary | ICD-10-CM

## 2022-06-09 DIAGNOSIS — E119 Type 2 diabetes mellitus without complications: Secondary | ICD-10-CM | POA: Diagnosis not present

## 2022-06-09 DIAGNOSIS — J449 Chronic obstructive pulmonary disease, unspecified: Secondary | ICD-10-CM | POA: Diagnosis present

## 2022-06-09 DIAGNOSIS — G4733 Obstructive sleep apnea (adult) (pediatric): Secondary | ICD-10-CM | POA: Diagnosis not present

## 2022-06-09 DIAGNOSIS — R2689 Other abnormalities of gait and mobility: Secondary | ICD-10-CM | POA: Diagnosis not present

## 2022-06-09 DIAGNOSIS — E782 Mixed hyperlipidemia: Secondary | ICD-10-CM | POA: Diagnosis not present

## 2022-06-09 DIAGNOSIS — Z7409 Other reduced mobility: Secondary | ICD-10-CM | POA: Diagnosis not present

## 2022-06-09 DIAGNOSIS — K2289 Other specified disease of esophagus: Secondary | ICD-10-CM | POA: Diagnosis not present

## 2022-06-09 DIAGNOSIS — F05 Delirium due to known physiological condition: Secondary | ICD-10-CM | POA: Diagnosis not present

## 2022-06-09 DIAGNOSIS — K449 Diaphragmatic hernia without obstruction or gangrene: Secondary | ICD-10-CM | POA: Diagnosis not present

## 2022-06-09 DIAGNOSIS — G894 Chronic pain syndrome: Secondary | ICD-10-CM | POA: Diagnosis present

## 2022-06-09 DIAGNOSIS — I10 Essential (primary) hypertension: Secondary | ICD-10-CM

## 2022-06-09 DIAGNOSIS — Z7901 Long term (current) use of anticoagulants: Secondary | ICD-10-CM

## 2022-06-09 DIAGNOSIS — R1319 Other dysphagia: Secondary | ICD-10-CM

## 2022-06-09 DIAGNOSIS — S32010D Wedge compression fracture of first lumbar vertebra, subsequent encounter for fracture with routine healing: Secondary | ICD-10-CM | POA: Diagnosis not present

## 2022-06-09 DIAGNOSIS — I482 Chronic atrial fibrillation, unspecified: Secondary | ICD-10-CM | POA: Diagnosis present

## 2022-06-09 DIAGNOSIS — I480 Paroxysmal atrial fibrillation: Secondary | ICD-10-CM

## 2022-06-09 DIAGNOSIS — M4856XA Collapsed vertebra, not elsewhere classified, lumbar region, initial encounter for fracture: Principal | ICD-10-CM | POA: Diagnosis present

## 2022-06-09 DIAGNOSIS — S32019K Unspecified fracture of first lumbar vertebra, subsequent encounter for fracture with nonunion: Secondary | ICD-10-CM | POA: Diagnosis not present

## 2022-06-09 DIAGNOSIS — Z043 Encounter for examination and observation following other accident: Secondary | ICD-10-CM | POA: Diagnosis not present

## 2022-06-09 DIAGNOSIS — E785 Hyperlipidemia, unspecified: Secondary | ICD-10-CM | POA: Diagnosis not present

## 2022-06-09 DIAGNOSIS — E669 Obesity, unspecified: Secondary | ICD-10-CM | POA: Diagnosis present

## 2022-06-09 DIAGNOSIS — M48061 Spinal stenosis, lumbar region without neurogenic claudication: Secondary | ICD-10-CM | POA: Diagnosis not present

## 2022-06-09 DIAGNOSIS — Z9682 Presence of neurostimulator: Secondary | ICD-10-CM

## 2022-06-09 DIAGNOSIS — E1122 Type 2 diabetes mellitus with diabetic chronic kidney disease: Secondary | ICD-10-CM | POA: Diagnosis present

## 2022-06-09 DIAGNOSIS — R1310 Dysphagia, unspecified: Secondary | ICD-10-CM

## 2022-06-09 DIAGNOSIS — K229 Disease of esophagus, unspecified: Secondary | ICD-10-CM

## 2022-06-09 DIAGNOSIS — Z86718 Personal history of other venous thrombosis and embolism: Secondary | ICD-10-CM

## 2022-06-09 DIAGNOSIS — K222 Esophageal obstruction: Secondary | ICD-10-CM

## 2022-06-09 DIAGNOSIS — D72829 Elevated white blood cell count, unspecified: Secondary | ICD-10-CM | POA: Diagnosis present

## 2022-06-09 DIAGNOSIS — E088 Diabetes mellitus due to underlying condition with unspecified complications: Secondary | ICD-10-CM | POA: Diagnosis present

## 2022-06-09 DIAGNOSIS — S32019A Unspecified fracture of first lumbar vertebra, initial encounter for closed fracture: Secondary | ICD-10-CM

## 2022-06-09 DIAGNOSIS — Z8673 Personal history of transient ischemic attack (TIA), and cerebral infarction without residual deficits: Secondary | ICD-10-CM

## 2022-06-09 DIAGNOSIS — R000 Tachycardia, unspecified: Secondary | ICD-10-CM | POA: Diagnosis present

## 2022-06-09 DIAGNOSIS — R933 Abnormal findings on diagnostic imaging of other parts of digestive tract: Secondary | ICD-10-CM | POA: Diagnosis not present

## 2022-06-09 DIAGNOSIS — J439 Emphysema, unspecified: Secondary | ICD-10-CM | POA: Diagnosis present

## 2022-06-09 DIAGNOSIS — N1832 Chronic kidney disease, stage 3b: Secondary | ICD-10-CM | POA: Diagnosis present

## 2022-06-09 DIAGNOSIS — Z7982 Long term (current) use of aspirin: Secondary | ICD-10-CM

## 2022-06-09 DIAGNOSIS — I2510 Atherosclerotic heart disease of native coronary artery without angina pectoris: Secondary | ICD-10-CM | POA: Diagnosis not present

## 2022-06-09 DIAGNOSIS — M79672 Pain in left foot: Secondary | ICD-10-CM | POA: Diagnosis present

## 2022-06-09 DIAGNOSIS — E1151 Type 2 diabetes mellitus with diabetic peripheral angiopathy without gangrene: Secondary | ICD-10-CM | POA: Diagnosis present

## 2022-06-09 DIAGNOSIS — Z7401 Bed confinement status: Secondary | ICD-10-CM | POA: Diagnosis not present

## 2022-06-09 DIAGNOSIS — S199XXA Unspecified injury of neck, initial encounter: Secondary | ICD-10-CM | POA: Diagnosis not present

## 2022-06-09 DIAGNOSIS — S299XXA Unspecified injury of thorax, initial encounter: Secondary | ICD-10-CM | POA: Diagnosis not present

## 2022-06-09 DIAGNOSIS — N183 Chronic kidney disease, stage 3 unspecified: Secondary | ICD-10-CM | POA: Diagnosis present

## 2022-06-09 DIAGNOSIS — M21371 Foot drop, right foot: Secondary | ICD-10-CM | POA: Diagnosis present

## 2022-06-09 DIAGNOSIS — K2970 Gastritis, unspecified, without bleeding: Secondary | ICD-10-CM | POA: Diagnosis not present

## 2022-06-09 DIAGNOSIS — R1314 Dysphagia, pharyngoesophageal phase: Secondary | ICD-10-CM | POA: Diagnosis present

## 2022-06-09 DIAGNOSIS — R262 Difficulty in walking, not elsewhere classified: Secondary | ICD-10-CM | POA: Diagnosis not present

## 2022-06-09 DIAGNOSIS — R2681 Unsteadiness on feet: Secondary | ICD-10-CM | POA: Diagnosis not present

## 2022-06-09 DIAGNOSIS — Z86711 Personal history of pulmonary embolism: Secondary | ICD-10-CM

## 2022-06-09 DIAGNOSIS — Z833 Family history of diabetes mellitus: Secondary | ICD-10-CM

## 2022-06-09 DIAGNOSIS — M5136 Other intervertebral disc degeneration, lumbar region: Secondary | ICD-10-CM | POA: Diagnosis present

## 2022-06-09 DIAGNOSIS — Z87891 Personal history of nicotine dependence: Secondary | ICD-10-CM | POA: Diagnosis not present

## 2022-06-09 DIAGNOSIS — K3189 Other diseases of stomach and duodenum: Secondary | ICD-10-CM | POA: Diagnosis present

## 2022-06-09 DIAGNOSIS — K224 Dyskinesia of esophagus: Secondary | ICD-10-CM | POA: Diagnosis not present

## 2022-06-09 DIAGNOSIS — Z85828 Personal history of other malignant neoplasm of skin: Secondary | ICD-10-CM

## 2022-06-09 DIAGNOSIS — Z993 Dependence on wheelchair: Secondary | ICD-10-CM

## 2022-06-09 DIAGNOSIS — Z7951 Long term (current) use of inhaled steroids: Secondary | ICD-10-CM

## 2022-06-09 DIAGNOSIS — Z79899 Other long term (current) drug therapy: Secondary | ICD-10-CM

## 2022-06-09 DIAGNOSIS — I4892 Unspecified atrial flutter: Secondary | ICD-10-CM | POA: Diagnosis not present

## 2022-06-09 DIAGNOSIS — I4891 Unspecified atrial fibrillation: Secondary | ICD-10-CM | POA: Diagnosis present

## 2022-06-09 DIAGNOSIS — R627 Adult failure to thrive: Secondary | ICD-10-CM | POA: Diagnosis not present

## 2022-06-09 DIAGNOSIS — K2270 Barrett's esophagus without dysplasia: Secondary | ICD-10-CM | POA: Diagnosis not present

## 2022-06-09 DIAGNOSIS — Z981 Arthrodesis status: Secondary | ICD-10-CM

## 2022-06-09 HISTORY — DX: Unspecified fracture of first lumbar vertebra, initial encounter for closed fracture: S32.019A

## 2022-06-09 HISTORY — DX: Disease of esophagus, unspecified: K22.9

## 2022-06-09 LAB — CBC WITH DIFFERENTIAL/PLATELET
Abs Immature Granulocytes: 0.04 10*3/uL (ref 0.00–0.07)
Basophils Absolute: 0 10*3/uL (ref 0.0–0.1)
Basophils Relative: 0 %
Eosinophils Absolute: 0.1 10*3/uL (ref 0.0–0.5)
Eosinophils Relative: 1 %
HCT: 47.3 % — ABNORMAL HIGH (ref 36.0–46.0)
Hemoglobin: 15.1 g/dL — ABNORMAL HIGH (ref 12.0–15.0)
Immature Granulocytes: 0 %
Lymphocytes Relative: 9 %
Lymphs Abs: 1 10*3/uL (ref 0.7–4.0)
MCH: 30.9 pg (ref 26.0–34.0)
MCHC: 31.9 g/dL (ref 30.0–36.0)
MCV: 96.7 fL (ref 80.0–100.0)
Monocytes Absolute: 0.7 10*3/uL (ref 0.1–1.0)
Monocytes Relative: 6 %
Neutro Abs: 9.9 10*3/uL — ABNORMAL HIGH (ref 1.7–7.7)
Neutrophils Relative %: 84 %
Platelets: 207 10*3/uL (ref 150–400)
RBC: 4.89 MIL/uL (ref 3.87–5.11)
RDW: 13.8 % (ref 11.5–15.5)
WBC: 11.7 10*3/uL — ABNORMAL HIGH (ref 4.0–10.5)
nRBC: 0 % (ref 0.0–0.2)

## 2022-06-09 LAB — COMPREHENSIVE METABOLIC PANEL
ALT: 25 U/L (ref 0–44)
AST: 35 U/L (ref 15–41)
Albumin: 3.8 g/dL (ref 3.5–5.0)
Alkaline Phosphatase: 65 U/L (ref 38–126)
Anion gap: 9 (ref 5–15)
BUN: 22 mg/dL (ref 8–23)
CO2: 24 mmol/L (ref 22–32)
Calcium: 10.8 mg/dL — ABNORMAL HIGH (ref 8.9–10.3)
Chloride: 103 mmol/L (ref 98–111)
Creatinine, Ser: 1.4 mg/dL — ABNORMAL HIGH (ref 0.44–1.00)
GFR, Estimated: 39 mL/min — ABNORMAL LOW (ref >60)
Glucose, Bld: 120 mg/dL — ABNORMAL HIGH (ref 70–99)
Potassium: 4.6 mmol/L (ref 3.5–5.1)
Sodium: 136 mmol/L (ref 135–145)
Total Bilirubin: 0.6 mg/dL (ref 0.3–1.2)
Total Protein: 6.7 g/dL (ref 6.5–8.1)

## 2022-06-09 LAB — BRAIN NATRIURETIC PEPTIDE: B Natriuretic Peptide: 93.3 pg/mL (ref 0.0–100.0)

## 2022-06-09 LAB — CBG MONITORING, ED: Glucose-Capillary: 116 mg/dL — ABNORMAL HIGH (ref 70–99)

## 2022-06-09 LAB — TROPONIN I (HIGH SENSITIVITY)
Troponin I (High Sensitivity): 7 ng/L (ref <18)
Troponin I (High Sensitivity): 8 ng/L (ref <18)

## 2022-06-09 MED ORDER — INSULIN ASPART 100 UNIT/ML IJ SOLN: 0.0000 [IU] | Freq: Every day | INTRAMUSCULAR | Status: DC

## 2022-06-09 MED ORDER — INSULIN ASPART 100 UNIT/ML IJ SOLN
0.0000 [IU] | Freq: Three times a day (TID) | INTRAMUSCULAR | Status: DC
  Administered 2022-06-10 – 2022-06-11 (×2): 1 [IU] via SUBCUTANEOUS
  Administered 2022-06-11 – 2022-06-12 (×2): 2 [IU] via SUBCUTANEOUS
  Administered 2022-06-12: 1 [IU] via SUBCUTANEOUS
  Administered 2022-06-12: 3 [IU] via SUBCUTANEOUS
  Administered 2022-06-13: 2 [IU] via SUBCUTANEOUS
  Administered 2022-06-13 – 2022-06-14 (×2): 1 [IU] via SUBCUTANEOUS
  Administered 2022-06-14: 2 [IU] via SUBCUTANEOUS
  Administered 2022-06-14 – 2022-06-15 (×2): 3 [IU] via SUBCUTANEOUS
  Administered 2022-06-15 – 2022-06-16 (×2): 1 [IU] via SUBCUTANEOUS
  Administered 2022-06-16: 2 [IU] via SUBCUTANEOUS
  Administered 2022-06-16: 1 [IU] via SUBCUTANEOUS
  Administered 2022-06-17: 2 [IU] via SUBCUTANEOUS
  Administered 2022-06-17: 1 [IU] via SUBCUTANEOUS
  Administered 2022-06-17: 2 [IU] via SUBCUTANEOUS
  Administered 2022-06-18 (×3): 1 [IU] via SUBCUTANEOUS
  Administered 2022-06-19: 3 [IU] via SUBCUTANEOUS
  Administered 2022-06-19: 1 [IU] via SUBCUTANEOUS
  Administered 2022-06-20 (×2): 2 [IU] via SUBCUTANEOUS

## 2022-06-09 MED ORDER — SODIUM CHLORIDE 0.9 % IV SOLN: INTRAVENOUS | Status: DC

## 2022-06-09 MED ORDER — HYDROMORPHONE HCL 1 MG/ML IJ SOLN
0.5000 mg | Freq: Once | INTRAMUSCULAR | Status: AC
  Administered 2022-06-09: 0.5 mg via INTRAVENOUS
  Filled 2022-06-09: qty 1

## 2022-06-09 MED ORDER — SODIUM CHLORIDE 0.9 % IV BOLUS
250.0000 mL | Freq: Once | INTRAVENOUS | Status: AC
  Administered 2022-06-09: 250 mL via INTRAVENOUS

## 2022-06-09 NOTE — Subjective & Objective (Signed)
Right foot weakness and already has a brace for this.  The leg has been weak for at least a week and a half now both legs are hurting A week ago she has fallen down she slumped against the wall and slid down.  As she did so her left leg got caught underneath her since the fall she has been having severe pain in her left foot.  She uses a walker at baseline and now has to be wheelchair-bound with assistance of her husband.  No fever no chills no cough no shortness of breath Patient is on Eliquis

## 2022-06-09 NOTE — Assessment & Plan Note (Signed)
Chronic stable continue home medications ?

## 2022-06-09 NOTE — Assessment & Plan Note (Signed)
Order CPAP. 

## 2022-06-09 NOTE — Assessment & Plan Note (Signed)
Chronic contributing to long-term back pain

## 2022-06-09 NOTE — Assessment & Plan Note (Signed)
Continue Mevacor to 40 mg a day

## 2022-06-09 NOTE — ED Notes (Signed)
ED TO INPATIENT HANDOFF REPORT  ED Nurse Name and Phone #: Lorelee New Name/Age/Gender [name] 77 y.o. female Room/Bed: 005C/005C  Code Status   Code Status: Full Code  Home/SNF/Other Home Patient oriented to: self, place, time, and situation Is this baseline? Yes   Triage Complete: Triage complete  Chief Complaint L1 vertebral fracture ([place]) [S32.019A]  Triage Note Pt/husband stated, Ive had problem with my right foot having foot drop and I have a brace. My leg has been weak for about a week and half . I called the Dr. And he said to come here.  Im also having both of my legs are hurting.   Allergies No Known Allergies  Level of Care/Admitting Diagnosis ED Disposition     ED Disposition  Admit   Condition  --   Comment  Hospital Area: [facility] [[id number]]  Level of Care: Progressive [[id number]]  Admit to Progressive based on following criteria: CARDIOVASCULAR & THORACIC of moderate stability with acute coronary syndrome symptoms/low risk myocardial infarction/hypertensive urgency/arrhythmias/heart failure potentially compromising stability and stable post cardiovascular intervention patients.  May place patient in observation at [facility] or [facility] if equivalent level of care is available:: No  Covid Evaluation: Asymptomatic - no recent exposure (last 10 days) testing not required  Diagnosis: [id number]  Admitting Physician: Toy Baker [[id number]]  Attending Physician: Toy Baker [[id number]]          B Medical/Surgery History Past Medical History:  Diagnosis Date   CAD (coronary artery disease) 11/04/2016   Cancer ([place])    skin cancer   Cauda equina compression ([place]) 08/28/2021   Cerebrovascular disease    Chronic atrial fibrillation ([place]) 09/28/2018   Chronic bilateral low back pain without sciatica 03/08/2018   Chronic kidney disease    Chronic obstructive lung disease ([place]) 09/28/2018    Chronic pain syndrome 03/08/2018   Chronic renal failure 09/28/2018   Chronic, continuous use of opioids 03/04/2021   Last Assessment & Plan:  Formatting of this note might be different from the original. Patient and I have discussed the hazardous effects of continued opiate pain medication usage. Risks and benefits of above medications including but not limited to possibility of respiratory depression, sedation, and even death were discussed with the patient who expressed an understanding.  Patient did not displ   CKD (chronic kidney disease) stage 3, GFR 30-59 ml/min (HCC)    Class 1 obesity due to excess calories with serious comorbidity and body mass index (BMI) of 34.0 to 34.9 in adult 05/20/2021   Last Assessment & Plan: Formatting of this note might be different from the original. Patient educated about the detrimental effects of weight as it specifically pertains to pain management and overall health.  Patient's BMI 34.08 Encouraged healthy eating habits and routine low-impact cardiovascular exercises as tolerated.   Comprehensive diabetic foot examination, type 2 DM, encounter for ([place]) 04/29/2021   DDD (degenerative disc disease), lumbar 11/17/2018   Last Assessment & Plan:  Formatting of this note might be different from the original. See spinal stenosis plan   Diabetes ([place])    Diabetes mellitus due to underlying condition with unspecified complications ([place]) [id number]   Dyshidrotic eczema 04/29/2021   Dysrhythmia    afib   Essential hypertension 11/04/2016   Facet arthritis of lumbar region 03/08/2018   Facet joint disease 11/17/2018   Last Assessment & Plan:  Formatting of this note might be different from the original. See  spinal stenosis plan   Foraminal stenosis of lumbar region 03/08/2018   Heart murmur    History of knee replacement, total, bilateral 11/17/2018   Last Assessment & Plan:  Formatting of this note might be different from the original. Have recommended to her  several times if any further concerns in regards to the pain status post arthroplasty, she is to follow up with her surgeon.   History of pulmonary embolism 11/28/2021   Hypertension    Hypertensive disorder 09/28/2018   Increased body mass index 09/28/2018   Lumbar radiculopathy 03/08/2018   Lumbar spondylosis 03/20/2019   Mixed dyslipidemia 11/04/2016   Morbid obesity ([place]) 11/04/2016   Muscle pain 04/29/2020   Osteoarthritis of left knee    PAF (paroxysmal atrial fibrillation) ([place]) 04/30/2015   Pain management contract agreement 04/13/2019   Last Assessment & Plan:  Formatting of this note might be different from the original. Contract updated today.  UDS completed.  [place] controlled substance registry reviewed and is consistent with her regimen.   Peripheral vascular disease ([place]) 09/28/2018   Preop cardiovascular exam 05/01/2019   Pressure injury of skin 11/21/2021   Pulmonary emboli [facility])    Pulmonary embolism ([place]) 11/21/2021   Renal insufficiency 04/30/2015   S/P insertion of spinal cord stimulator 07/13/2019   Last Assessment & Plan:  Formatting of this note might be different from the original. About 5 months status post insertion of spinal cord stimulator with excellent relief.  She continues to keep close follow-up with her representative and has been working to appropriately find best programs to get her adequate pain relief.   Sleep apnea    Smoker 09/28/2018   Spinal stenosis of lumbar region 03/20/2019   Spinal stenosis of lumbar region with neurogenic claudication 03/08/2018   Last Assessment & Plan:  Formatting of this note is different from the original. 77 year old female with chronic low back pain and bilateral, right greater than left, L4-5 radicular pain, and claudication. She has substantial amount of multifactorial degenerative thoracic and lumbar spine pathology.  After being deemed an inappropriate open surgical candidate, and failing to respond to  multiple in   Spondylolisthesis 03/20/2019   Spondylosis of lumbosacral region without myelopathy or radiculopathy 01/30/2019   Last Assessment & Plan:  Formatting of this note might be different from the original. See spinal stenosis plan   Tinea unguium 02/03/2022   Type 2 diabetes mellitus ([place]) 09/28/2018   Past Surgical History:  Procedure Laterality Date   ABDOMINAL HYSTERECTOMY     BACK SURGERY     EYE SURGERY     bilateral cataracts   IR ANGIOGRAM PULMONARY BILATERAL SELECTIVE  11/21/2021   IR ANGIOGRAM SELECTIVE EACH ADDITIONAL VESSEL  11/21/2021   IR ANGIOGRAM SELECTIVE EACH ADDITIONAL VESSEL  11/21/2021   IR THROMBECT PRIM MECH INIT (INCLU) MOD SED  11/21/2021   IR US GUIDE VASC ACCESS RIGHT  11/21/2021   JOINT REPLACEMENT Bilateral    knees   RADIOLOGY WITH ANESTHESIA N/A 11/21/2021   Procedure: IR WITH ANESTHESIA;  Surgeon: Radiologist, Medication, MD;  Location: [facility];  Service: Radiology;  Laterality: N/A;   SPINAL CORD STIMULATOR INSERTION N/A 07/07/2019   Procedure: LUMBAR SPINAL CORD STIMULATOR INSERTION;  Surgeon: Clydell Hakim, MD;  Location: [facility];  Service: Neurosurgery;  Laterality: N/A;   total left knee       A IV Location/Drains/Wounds Patient Lines/Drains/Airways Status     Active Line/Drains/Airways     Name Placement date Placement time Site  Days   Peripheral IV 06/09/22 20 G Anterior;Right Forearm 06/09/22  1105  Forearm  less than 1   Peripheral IV 06/09/22 20 G Right Antecubital 06/09/22  1334  Antecubital  less than 1   Incision (Closed) 12/04/14 Back Left;Lower 12/04/14  1024  -- 2744   Incision (Closed) 07/07/19 Back 07/07/19  0924  -- 1068   Incision (Closed) 08/28/21 Back Other (Comment) 08/28/21  1558  -- 285   Pressure Injury 11/21/21 Buttocks Left;Medial Stage 2 -  Partial thickness loss of dermis presenting as a shallow open injury with a red, pink wound bed without slough. 11/21/21  1112  -- 200            Intake/Output Last 24  hours No intake or output data in the 24 hours ending 06/09/22 2247  Labs/Imaging Results for orders placed or performed during the hospital encounter of 06/09/22 (from the past 48 hour(s))  CBC with Differential     Status: Abnormal   Collection Time: 06/09/22 11:02 AM  Result Value Ref Range   WBC 11.7 (H) 4.0 - 10.5 K/uL   RBC 4.89 3.87 - 5.11 MIL/uL   Hemoglobin 15.1 (H) 12.0 - 15.0 g/dL   HCT 47.3 (H) 36.0 - 46.0 %   MCV 96.7 80.0 - 100.0 fL   MCH 30.9 26.0 - 34.0 pg   MCHC 31.9 30.0 - 36.0 g/dL   RDW 13.8 11.5 - 15.5 %   Platelets 207 150 - 400 K/uL   nRBC 0.0 0.0 - 0.2 %   Neutrophils Relative % 84 %   Neutro Abs 9.9 (H) 1.7 - 7.7 K/uL   Lymphocytes Relative 9 %   Lymphs Abs 1.0 0.7 - 4.0 K/uL   Monocytes Relative 6 %   Monocytes Absolute 0.7 0.1 - 1.0 K/uL   Eosinophils Relative 1 %   Eosinophils Absolute 0.1 0.0 - 0.5 K/uL   Basophils Relative 0 %   Basophils Absolute 0.0 0.0 - 0.1 K/uL   Immature Granulocytes 0 %   Abs Immature Granulocytes 0.04 0.00 - 0.07 K/uL    Comment: Performed at [facility], [address], [place], [place] [address]  Comprehensive metabolic panel     Status: Abnormal   Collection Time: 06/09/22 11:02 AM  Result Value Ref Range   Sodium 136 135 - 145 mmol/L   Potassium 4.6 3.5 - 5.1 mmol/L   Chloride 103 98 - 111 mmol/L   CO2 24 22 - 32 mmol/L   Glucose, Bld 120 (H) 70 - 99 mg/dL    Comment: Glucose reference range applies only to samples taken after fasting for at least 8 hours.   BUN 22 8 - 23 mg/dL   Creatinine, Ser 1.40 (H) 0.44 - 1.00 mg/dL   Calcium 10.8 (H) 8.9 - 10.3 mg/dL   Total Protein 6.7 6.5 - 8.1 g/dL   Albumin 3.8 3.5 - 5.0 g/dL   AST 35 15 - 41 U/L   ALT 25 0 - 44 U/L   Alkaline Phosphatase 65 38 - 126 U/L   Total Bilirubin 0.6 0.3 - 1.2 mg/dL   GFR, Estimated 39 (L) >60 mL/min    Comment: (NOTE) Calculated using the CKD-EPI Creatinine Equation (2021)    Anion gap 9 5 - 15    Comment: Performed at [facility] [address] [place], [place] [address]  Troponin I (High Sensitivity)     Status: None   Collection Time: 06/09/22 11:02 AM  Result Value Ref Range   Troponin I (High Sensitivity) 7 <18 ng/L    Comment: (NOTE) Elevated high sensitivity troponin I (hsTnI) values and significant  changes across serial measurements may suggest ACS but many other  chronic and acute conditions are known to elevate hsTnI results.  Refer to the "Links" section for chest pain algorithms and additional  guidance. Performed at [facility], [place] [address] [place], [place] [address]   Brain natriuretic peptide     Status: None   Collection Time: 06/09/22 11:08 AM  Result Value Ref Range   B Natriuretic Peptide 93.3 0.0 - 100.0 pg/mL    Comment: Performed at [facility] [address], [place], [place] [address]  Troponin I (High Sensitivity)     Status: None   Collection Time: 06/09/22  1:31 PM  Result Value Ref Range   Troponin I (High Sensitivity) 8 <18 ng/L    Comment: (NOTE) Elevated high sensitivity troponin I (hsTnI) values and significant  changes across serial measurements may suggest ACS but many other  chronic and acute conditions are known to elevate hsTnI results.  Refer to the "Links" section for chest pain algorithms and additional  guidance. Performed at [facility], [place] [address] [place], [place] [address]    MR LUMBAR SPINE WO CONTRAST  Result Date: 06/09/2022 CLINICAL DATA:  Right foot drop for a week and a half. EXAM: MRI LUMBAR SPINE WITHOUT CONTRAST TECHNIQUE: Multiplanar, multisequence MR imaging of the lumbar spine was performed. No intravenous contrast was administered. COMPARISON:  Lumbar CT from earlier the same day FINDINGS: Segmentation:  Standard. Alignment:  Dextroscoliosis. Vertebrae: L1 inferior endplate fracture with [id number] height loss measured by CT. Fracture is potentially subacute by CT. Mild concavity of the L2 superior endplate as  well. Discogenic endplate edema at the L1 and L2 bodies. No aggressive bone lesion. Conus medullaris and cauda equina: Conus extends to the T12-L1 level. The conus appears T2 hyperintense at the level of the gray matter although only seen at levels where there is artifact from spinal cord stimulator. Paraspinal and other soft tissues: Postoperative scarring and atrophy of intrinsic back muscles. Sigmoid diverticulosis. Disc levels: T10-11: Disc collapse with endplate and facet spurring causing biforaminal impingement and at least moderate spinal stenosis. T11-12: Disc collapse and endplate degeneration with circumferential bulging and facet spurring. Biforaminal impingement. Signs of high-grade spinal stenosis although likely exacerbated by artifact from the hardware. T12- L1: Degenerative facet spurring asymmetric to the right. The canal and foramina are patent L1-L2: Disc narrowing and bulging with endplate spurring. Moderate spinal stenosis. Left more than right foraminal impingement. L2-L3: Fusion with no visible impingement L3-L4: Degenerative facet spurring which is bulky. Circumferential disc bulging with left foraminal annular fissure. Moderate spinal stenosis. Moderate right foraminal narrowing L4-L5: Degenerative facet spurring with bulky hypertrophy asymmetric to the right. Mild anterolisthesis and circumferential disc bulging. Moderate spinal stenosis. Right foraminal impingement. L5-S1:Bulky degenerative facet spurring. IMPRESSION: 1. Advanced and generalized lumbar spine degeneration with scoliosis and L4-5 anterolisthesis. 2. L1 inferior endplate fracture which is subacute to chronic. L1 and L2 vertebral body edema which could be degenerative or traumatic. 3. T11-12 severe degeneration with spinal stenosis and apparent cord impingement that is likely accentuated by susceptibility artifact from spinal cord stimulator leads. On axial T2 weighted imaging there is question of lower cord edema, correlate  for myelopathy. Severe biforaminal impingement at this level. 4. Foraminal stenosis on the right at L1-2, L3-4, and L4-5.  5. Left foraminal stenosis primarily at L1-2. 6. Moderate spinal stenosis at L3-4 and L4-5. Electronically Signed   By: Jorje Guild M.D.   On: 06/09/2022 19:58   CT Thoracic Spine Wo Contrast  Result Date: 06/09/2022 CLINICAL DATA:  Back trauma EXAM: CT THORACIC SPINE WITHOUT CONTRAST TECHNIQUE: Multidetector CT images of the thoracic were obtained using the standard protocol without intravenous contrast. RADIATION DOSE REDUCTION: This exam was performed according to the departmental dose-optimization program which includes automated exposure control, adjustment of the mA and/or kV according to patient size and/or use of iterative reconstruction technique. COMPARISON:  None Available. FINDINGS: Alignment: Normal. Vertebrae: No acute fracture or focal pathologic process. The see separately dictated lumbar spine CT for description of osseous changes of the L1 vertebral body level. Paraspinal and other soft tissues: Small amount of secretions in the esophagus. Moderate centrilobular emphysema. Ground-glass opacity in the right lobe, nonspecific. Gallbladder is partially imaged and appears distended. Right basilar atelectasis. Is mild nonspecific soft tissue stranding of the GE junction (series 19, image 105). Large left-sided thyroid nodule measuring up to 1.8 cm. Disc levels: Spinal nerve stimulator leads terminate at the T6-T9 levels. IMPRESSION: 1. No acute fracture or traumatic malalignment of the thoracic spine. 2. Small amount of secretions/debris in the esophagus with mild nonspecific soft tissue stranding at the GE junction. Correlate for symptoms of dysphagia. Ground-glass opacity in the right upper lobe, nonspecific. 3. Spinal nerve stimulator leads terminate at the T6-T9 levels. 4. 1.8 cm left thyroid nodule. Recommend further evaluation with a dedicated thyroid ultrasound. 5.  Please refer to the separately dictated lumbar spine CT for description of osseous changes of the L1 vertebral body level. Emphysema (ICD10-J43.9). Electronically Signed   By: Marin [place] M.D.   On: 06/09/2022 13:01   CT Lumbar Spine Wo Contrast  Result Date: 06/09/2022 CLINICAL DATA:  Right foot drop. Worsening leg weakness over the last 2 weeks. Back trauma. EXAM: CT LUMBAR SPINE WITHOUT CONTRAST TECHNIQUE: Multidetector CT imaging of the lumbar spine was performed without intravenous contrast administration. Multiplanar CT image reconstructions were also generated. RADIATION DOSE REDUCTION: This exam was performed according to the departmental dose-optimization program which includes automated exposure control, adjustment of the mA and/or kV according to patient size and/or use of iterative reconstruction technique. COMPARISON:  Radiography 09/22/2021.  MRI 08/15/2021 FINDINGS: Segmentation: 5 lumbar type vertebral bodies as numbered previously. Alignment: Lower thoracic and upper lumbar curvature convex to the right. Vertebrae: Chronic degenerative endplate changes at [id number]. Compression fracture/insufficiency fracture of the inferior endplate of L1. Probable solid union at the L2-3 fusion level. Paraspinal and other soft tissues: Negative. Neurostimulator in place entering the T12-L1 interlaminar space. Disc levels: T11-12: Chronic disc degeneration with loss of disc height. Chronic endplate sclerosis and cystic change. Circumferential protrusion of the disc. Potential for significant stenosis at this level. T12-L1: Disc bulge. No compressive stenosis. Neurostimulator enters posteriorly at this level. L1-2: Progressive disc degeneration with vacuum phenomenon. Inferior endplate compression fracture or insufficiency fracture at L1 with loss of height anteriorly of 30%. Circumferential bulging of the disc. Multifactorial stenosis at this level that could be significant. Pedicle screw on the right comes very  close to or breaches the superior endplate. L2-3: Previous discectomy and fusion procedure. Probable solid union. Sufficient decompression of the canal. Some potential for ongoing foraminal stenosis on the right. L3-4: Bulging of the disc. Facet and ligamentous hypertrophy. Moderate multifactorial stenosis. Bilateral foraminal stenosis. L4-5: Chronic facet arthropathy with anterolisthesis of 3 mm. Bulging  of the disc. Moderate multifactorial stenosis. Bilateral foraminal stenosis. L5-S1: Bulging of the disc. Bilateral facet degeneration and hypertrophy. Stenosis of the subarticular lateral recesses. Sufficient patency of the central canal and neural foramina. Bilateral sacroiliac osteoarthritis is present IMPRESSION: 1. T11-12: Chronic disc degeneration with loss of disc height. Chronic endplate sclerosis and cystic change. Circumferential protrusion of the disc. Potential for significant stenosis at this level. 2. L1-2: Progressive disc degeneration with vacuum phenomenon. Inferior endplate compression fracture or insufficiency fracture at L1 with loss of height anteriorly of 30%. Circumferential bulging of the disc. Multifactorial stenosis at this level that could be significant. Pedicle screw on the right comes very close to or breaches the superior endplate. 3. L2-3: Previous discectomy and fusion procedure. Probable solid union. Sufficient decompression of the canal. Some potential for ongoing foraminal stenosis on the right. 4. L3-4: Moderate multifactorial stenosis. Bilateral foraminal stenosis. 5. L4-5: Chronic facet arthropathy with anterolisthesis of 3 mm. Bulging of the disc. Moderate multifactorial stenosis. Bilateral foraminal stenosis. 6. L5-S1: Bilateral facet degeneration and hypertrophy. Stenosis of the subarticular lateral recesses. Sufficient patency of the central canal and neural foramina. Electronically Signed   By: Nelson Chimes M.D.   On: 06/09/2022 12:58   CT Head Wo Contrast  Result  Date: 06/09/2022 CLINICAL DATA:  Head trauma, minor (Age >= 65y); Neck trauma (Age >= 65y) EXAM: CT HEAD WITHOUT CONTRAST CT CERVICAL SPINE WITHOUT CONTRAST TECHNIQUE: Multidetector CT imaging of the head and cervical spine was performed following the standard protocol without intravenous contrast. Multiplanar CT image reconstructions of the cervical spine were also generated. RADIATION DOSE REDUCTION: This exam was performed according to the departmental dose-optimization program which includes automated exposure control, adjustment of the mA and/or kV according to patient size and/or use of iterative reconstruction technique. COMPARISON:  11/21/2021 FINDINGS: CT HEAD FINDINGS Brain: No evidence of acute infarction, hemorrhage, hydrocephalus, extra-axial collection or mass lesion/mass effect. Chronic small left occipital lobe infarct. Patchy low-density changes within the periventricular and subcortical white matter most compatible with chronic microvascular ischemic change. Mild diffuse cerebral volume loss. Vascular: Atherosclerotic calcifications involving the large vessels of the skull base. No unexpected hyperdense vessel. Skull: Normal. Negative for fracture or focal lesion. Sinuses/Orbits: No acute finding. 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 Other: Negative for scalp hematoma. CT CERVICAL SPINE FINDINGS Alignment: Facet joints are aligned without dislocation or traumatic listhesis. Dens and lateral masses are aligned. Grade 1 anterolisthesis of C3 on C4, C4 on C5, and C5 on C6 mediated by degenerative facet arthropathy. Skull base and vertebrae: No acute fracture. No suspicious lytic or sclerotic bone lesion. There is a 1.5 cm well-circumscribed bony protuberance arising posteriorly from the left C3 lamina, likely reflecting an osteochondroma. Soft tissues and spinal canal: No prevertebral fluid or swelling. No visible canal hematoma. Disc levels:  Moderate-advanced multilevel cervical spondylosis with both degenerative disc disease and facet arthropathy. Upper chest: Mild emphysematous changes at the lung apices. Other: Bilateral carotid atherosclerosis. IMPRESSION: 1. No acute intracranial abnormality. 2. No acute fracture or traumatic listhesis of the cervical spine. Electronically Signed   By: Davina Poke D.O.   On: 06/09/2022 12:56   CT Cervical Spine Wo Contrast  Result Date: 06/09/2022 CLINICAL DATA:  Head trauma, minor (Age >= 65y); Neck trauma (Age >= 65y) EXAM: CT HEAD WITHOUT CONTRAST CT CERVICAL SPINE WITHOUT CONTRAST TECHNIQUE: Multidetector CT imaging of the head and cervical spine was performed following the standard protocol without  intravenous contrast. Multiplanar CT image reconstructions of the cervical spine were also generated. RADIATION DOSE REDUCTION: This exam was performed according to the departmental dose-optimization program which includes automated exposure control, adjustment of the mA and/or kV according to patient size and/or use of iterative reconstruction technique. COMPARISON:  11/21/2021 FINDINGS: CT HEAD FINDINGS Brain: No evidence of acute infarction, hemorrhage, hydrocephalus, extra-axial collection or mass lesion/mass effect. Chronic small left occipital lobe infarct. Patchy low-density changes within the periventricular and subcortical white matter most compatible with chronic microvascular ischemic change. Mild diffuse cerebral volume loss. Vascular: Atherosclerotic calcifications involving the large vessels of the skull base. No unexpected hyperdense vessel. Skull: Normal. Negative for fracture or focal lesion. Sinuses/Orbits: No acute finding. 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 Other: Negative for scalp hematoma. CT CERVICAL SPINE FINDINGS Alignment: Facet joints are aligned without dislocation or traumatic listhesis. Dens and lateral masses are  aligned. Grade 1 anterolisthesis of C3 on C4, C4 on C5, and C5 on C6 mediated by degenerative facet arthropathy. Skull base and vertebrae: No acute fracture. No suspicious lytic or sclerotic bone lesion. There is a 1.5 cm well-circumscribed bony protuberance arising posteriorly from the left C3 lamina, likely reflecting an osteochondroma. Soft tissues and spinal canal: No prevertebral fluid or swelling. No visible canal hematoma. Disc levels: Moderate-advanced multilevel cervical spondylosis with both degenerative disc disease and facet arthropathy. Upper chest: Mild emphysematous changes at the lung apices. Other: Bilateral carotid atherosclerosis. IMPRESSION: 1. No acute intracranial abnormality. 2. No acute fracture or traumatic listhesis of the cervical spine. Electronically Signed   By: Davina Poke D.O.   On: 06/09/2022 12:56   DG Foot Complete Left  Result Date: 06/09/2022 CLINICAL DATA:  Trauma, fall, pain EXAM: LEFT FOOT - COMPLETE 3+ VIEW COMPARISON:  None Available. FINDINGS: No displaced fracture or dislocation is seen. Osteopenia is seen in bony structures. Small plantar spur is seen in calcaneus. Small bony spurs are noted in the dorsal aspect of talonavicular joint. IMPRESSION: No recent fracture or dislocation is seen in left foot. Electronically Signed   By: Elmer Picker M.D.   On: 06/09/2022 12:15   DG Ankle 2 Views Left  Result Date: 06/09/2022 CLINICAL DATA:  Trauma, fall EXAM: LEFT ANKLE - 2 VIEW COMPARISON:  None Available. FINDINGS: No fracture or dislocation is seen. Osteopenia is seen in bony structures. Small plantar spur is seen in calcaneus. Small bony spurs are noted in the dorsal aspect of talonavicular joint. There is soft tissue swelling around the ankle. IMPRESSION: No fracture or dislocation is seen in left ankle. Electronically Signed   By: Elmer Picker M.D.   On: 06/09/2022 12:12   DG Chest 1 View  Result Date: 06/09/2022 CLINICAL DATA:  Fall EXAM: CHEST   1 VIEW COMPARISON:  11/21/21 CXR FINDINGS: Spinal nerve stimulator leads in place. Partially visualized lumbar spinal fusion hardware in place. No pleural effusion. No pneumothorax. Unchanged cardiac and mediastinal contours when accounting differences in positioning. No focal airspace opacity. No radiographically apparent displaced rib fractures. IMPRESSION: No radiographic evidence of thoracic injury. Electronically Signed   By: Marin Roberts M.D.   On: 06/09/2022 12:11    Pending Labs Unresulted Labs (From admission, onward)     Start     Ordered   06/09/22 2228  Hemoglobin A1c  Once,   R       Comments: To assess prior glycemic control    06/09/22 2227   Signed  and Held  Comprehensive metabolic panel  Tomorrow morning,   R       Question:  Release to patient  Answer:  Immediate   Signed and Held   Signed and Held  CBC  Tomorrow morning,   R       Question:  Release to patient  Answer:  Immediate   Signed and Held   Signed and Held  Magnesium  Tomorrow morning,   R        Signed and Held   Signed and Held  Phosphorus  Tomorrow morning,   R        Signed and Held            Vitals/Pain Today's Vitals   06/09/22 2030 06/09/22 2038 06/09/22 2100 06/09/22 2214  BP: 136/71  117/78   Pulse: (!) 125  (!) 128   Resp: 20  17   Temp:  98.7 F (37.1 C)    TempSrc:  Oral    SpO2: 95%  95%   Weight:      Height:      PainSc:    Asleep    Isolation Precautions No active isolations  Medications Medications  insulin aspart (novoLOG) injection 0-5 Units (has no administration in time range)  insulin aspart (novoLOG) injection 0-9 Units (has no administration in time range)  0.9 %  sodium chloride infusion (has no administration in time range)  sodium chloride 0.9 % bolus 250 mL (has no administration in time range)  HYDROmorphone (DILAUDID) injection 0.5 mg (0.5 mg Intravenous Given 06/09/22 2111)    Mobility non-ambulatory     Focused  Assessments N/A   R Recommendations: See Admitting Provider Note  Report given to:   Additional Notes: N/A

## 2022-06-09 NOTE — ED Provider Notes (Signed)
[facility] Provider Note   CSN: [id number] Arrival date & time: 06/09/22  1030     History {Add pertinent medical, surgical, social history, OB history to HPI:1} Chief Complaint  Patient presents with   Extremity Weakness   Leg Pain    Lisa Maxwell is a 77 y.o. female.  77 year old female with prior medical history as detailed below presents for evaluation.  She is accompanied by her husband.  She arrives from home.  Approximately a week ago the patient had a ground-level fall.  She slumped against the wall and then slid to the floor.  She apparently caught her left leg underneath her and she fell.  Since the fall the patient has experienced increased pain to the left foot.  At baseline the patient uses a walker.  Since the fall 1 week ago she has been using a wheelchair to get around her house with her husband's assistance.  Her primary complaint today is of persistent pain in left foot.  She reports associated weakness in the left foot as well.  She denies other recent illness such as fever, cough, chest pain, shortness of breath.  The history is provided by the patient and medical records.  Leg Pain      Home Medications Prior to Admission medications   Medication Sig Start Date End Date Taking? Authorizing Provider  apixaban (ELIQUIS) 5 MG TABS tablet Take 1 tablet (5 mg total) by mouth 2 (two) times daily. 11/28/21   Revankar, Reita Cliche, MD  azithromycin (ZITHROMAX) 250 MG tablet Take as directed 03/13/22   Freddi Starr, MD  Coenzyme Q10 (CO Q-10) 200 MG CAPS Take 200 mg by mouth in the morning.    [provider]  Cyanocobalamin (VITAMIN B-12) 500 MCG SUBL Place 500 mcg under the tongue in the morning.    [provider]  diphenhydrAMINE (BENADRYL) 25 MG tablet Take 25 mg by mouth in the morning.    [provider]  docusate sodium (COLACE) 100 MG capsule Take 100 mg by mouth daily. 10/08/21    [provider]  fluticasone furoate-vilanterol (BREO ELLIPTA) 100-25 MCG/ACT AEPB Inhale 1-2 puffs into the lungs daily.    [provider]  gabapentin (NEURONTIN) 600 MG tablet Take 600 mg by mouth at bedtime. 04/06/21   [provider]  HYDROcodone-acetaminophen (NORCO) 10-325 MG tablet Take 0.5 tablets by mouth 4 (four) times daily.    [provider]  Iron, Ferrous Sulfate, 325 (65 Fe) MG TABS Take 65 mg of iron by mouth daily with breakfast.    [provider]  latanoprost (XALATAN) 0.005 % ophthalmic solution Place 1 drop into both eyes at bedtime. 11/17/21   [provider]  lovastatin (MEVACOR) 40 MG tablet Take 40 mg by mouth at bedtime.     [provider]  methocarbamol (ROBAXIN) 500 MG tablet Take 1 tablet (500 mg total) by mouth every 6 (six) hours as needed for muscle spasms. Patient taking differently: Take 250 mg by mouth See admin instructions. Take 250 mg by mouth three to four times a day 08/29/21   Consuella Lose, MD  metoprolol tartrate (LOPRESSOR) 25 MG tablet Take 0.5 tablets (12.5 mg total) by mouth 2 (two) times daily. 11/24/21   Ghimire, Henreitta Leber, MD  Multiple Vitamins-Minerals (CENTRUM SILVER 50+WOMEN) TABS Take 1 tablet by mouth daily with breakfast.    [provider]  naloxone (NARCAN) nasal spray 4 mg/0.1 mL Place 1  spray into the nose daily as needed (accidental overdose).    [provider]  nitroGLYCERIN (NITROSTAT) 0.4 MG SL tablet Place 0.4 mg under the tongue every 5 (five) minutes as needed for chest pain.    [provider]  NONFORMULARY OR COMPOUNDED ITEM Please evaluate and treat for outpatient PT Diagnoses: Deconditioning due to Acute Hypoxia/Shock from saddle embolus 11/24/21   Jonetta Osgood, MD  pantoprazole (PROTONIX) 40 MG tablet Take 1 tablet (40 mg total) by mouth daily. 11/25/21   Ghimire, Henreitta Leber, MD  PROAIR HFA 108 [phone] Base) MCG/ACT inhaler Inhale 2 puffs  into the lungs every 4 (four) hours as needed for wheezing or shortness of breath. 10/12/16   [provider]  RYBELSUS 3 MG TABS Take 3 mg by mouth daily before breakfast. 05/12/21   [provider]  triamcinolone ointment (KENALOG) 0.5 % Apply 1 Application topically See admin instructions. Apply to affected areas of both feet two to three times a week    [provider]  trimethoprim (TRIMPEX) 100 MG tablet Take 100 mg by mouth in the morning. 10/25/21   [provider]      Allergies    Patient has no known allergies.    Review of Systems   Review of Systems  All other systems reviewed and are negative.   Physical Exam Updated Vital Signs Temp 98.4 F (36.9 C) (Oral)   Ht '4\' 8"'$  (1.422 m)   Wt 71.2 kg   BMI 35.20 kg/m  Physical Exam Vitals and nursing note reviewed.  Constitutional:      General: She is not in acute distress.    Appearance: Normal appearance. She is well-developed.  HENT:     Head: Normocephalic and atraumatic.  Eyes:     Conjunctiva/sclera: Conjunctivae normal.     Pupils: Pupils are equal, round, and reactive to light.  Cardiovascular:     Rate and Rhythm: Normal rate and regular rhythm.     Heart sounds: Normal heart sounds.  Pulmonary:     Effort: Pulmonary effort is normal. No respiratory distress.     Breath sounds: Normal breath sounds.  Abdominal:     General: There is no distension.     Palpations: Abdomen is soft.     Tenderness: There is no abdominal tenderness.  Musculoskeletal:        General: No deformity. Normal range of motion.     Cervical back: Normal range of motion and neck supple.     Right lower leg: Edema present.     Left lower leg: Edema present.  Skin:    General: Skin is warm and dry.     Comments: Superficial bruising noted to the left foot at the base of the fifth toe.  Neurological:     General: No focal deficit present.     Mental Status: She is alert and oriented to person, place,  and time.     ED Results / Procedures / Treatments   Labs (all labs ordered are listed, but only abnormal results are displayed) Labs Reviewed  CBC WITH DIFFERENTIAL/PLATELET  COMPREHENSIVE METABOLIC PANEL  BRAIN NATRIURETIC PEPTIDE  TROPONIN I (HIGH SENSITIVITY)    EKG EKG Interpretation  Date/Time:  Tuesday June 09 2022 10:41:05 EST Ventricular Rate:  114 PR Interval:    QRS Duration: 124 QT Interval:  318 QTC Calculation: 406 R Axis:   72 Text Interpretation: Atrial fibrillation Ventricular premature complex Right bundle branch block Confirmed by Dene Gentry (  [id number]) on 06/09/2022 10:45:43 AM  Radiology No results found.  Procedures Procedures  {Document cardiac monitor, telemetry assessment procedure when appropriate:1}  Medications Ordered in ED Medications - No data to display  ED Course/ Medical Decision Making/ A&P   {   Click here for ABCD2, HEART and other calculatorsREFRESH Note before signing :1}                          Medical Decision Making Amount and/or Complexity of Data Reviewed Labs: ordered. Radiology: ordered.    Medical Screen Complete  This patient presented to the ED with complaint of left lower extremity weakness, fall.  This complaint involves an extensive number of treatment options. The initial differential diagnosis includes, but is not limited to, trauma related to fall, spinal cord impingement, metabolic abnormality, etc.  This presentation is: Acute, Chronic, Self-Limited, Previously Undiagnosed, Uncertain Prognosis, Complicated, Systemic Symptoms, and Threat to Life/Bodily Function  Patient with known history of significant low back pathology.  Patient is status post fusion and spinal cord stimulator.  Patient reports approximately 1 week ago ground-level fall in which she slumped against the wall and same to the floor.  She reports that since this incident she has been unable to bear weight on her lower extremities.   Husband reports that she has required transport throughout the house with a wheelchair.  Initial screening labs including CBC, CMP, troponin, BNP are without significant abnormality.  CT Spine imaging demonstrates extensive pathology with multiple possible sites of  impingement.  Case briefly discussed with Dr. Kathyrn Sheriff with neurosurgery.  Patient is known to Dr. Kathyrn Sheriff from prior care.  Patient with spinal cord stimulator.  Patient with pending MRI of lumbar spine.  Spinal cord stimulator is being charged at this time.  MRI to occur after full charge of stimulator is obtained.  Dr. Kathyrn Sheriff would appreciate call back with MRI results.  Patient will likely require admission given her inability to ambulate.  Additional history obtained:  Additional history obtained from [facility] External records from outside sources obtained and reviewed including prior ED visits and prior Inpatient records.    Lab Tests:  I ordered and personally interpreted labs.  The pertinent results include: CBC, CMP, troponin, BNP   Imaging Studies ordered:  I ordered imaging studies including CT head, CT T, L spine.  Plain films of left foot, left ankle, chest I independently visualized and interpreted obtained imaging which showed stents of pathology in the lumbar spine concerning for multiple sites of possible  impingement I agree with the radiologist interpretation.   Cardiac Monitoring:  The patient was maintained on a cardiac monitor.  I personally viewed and interpreted the cardiac monitor which showed an underlying rhythm of: NSR  Problem List / ED Course:  Lower extremity weakness   Reevaluation:  After the interventions noted above, I reevaluated the patient and found that they have: stayed the same   Disposition:  After consideration of the diagnostic results and the patients response to treatment, I feel that the patent would benefit from ***.    {Document critical care time when  appropriate:1} {Document review of labs and clinical decision tools ie heart score, Chads2Vasc2 etc:1}  {Document your independent review of radiology images, and any outside records:1} {Document your discussion with family members, caretakers, and with consultants:1} {Document social determinants of health affecting pt's care:1} {Document your decision making why or why not admission, treatments were needed:1} Final Clinical Impression(s) / ED Diagnoses  Final diagnoses:  Weakness    Rx / DC Orders ED Discharge Orders     None

## 2022-06-09 NOTE — ED Triage Notes (Signed)
Pt/husband stated, Ive had problem with my right foot having foot drop and I have a brace. My leg has been weak for about a week and half . I called the Dr. And he said to come here.  Im also having both of my legs are hurting.

## 2022-06-09 NOTE — ED Notes (Signed)
TSLO to be placed on the floor per ortho tech. Floor nurse to contact ortho tech to apply.

## 2022-06-09 NOTE — Assessment & Plan Note (Addendum)
Order sliding scale hold po meds

## 2022-06-09 NOTE — Assessment & Plan Note (Addendum)
Continue Eliquis 5 mg twice daily Can try gently fluids  BP soft to start metoprolol And HR elevated non consistetnly

## 2022-06-09 NOTE — ED Notes (Signed)
Patient taken to MRI via transport.

## 2022-06-09 NOTE — Assessment & Plan Note (Signed)
Will need outpatient follow-up with nutrition

## 2022-06-09 NOTE — H&P (Addendum)
Lisa Maxwell [id number] DOB: November 09, 1945 DOA: 06/09/2022     PCP: Nicoletta Dress, MD   Outpatient Specialists:  CARDS:  Gerilyn Pilgrim, MD    Neurosurgreon Dr.NUNDKAMAR  Patient arrived to ER on 06/09/22 at 1030 Referred by Attending Milton Ferguson, MD   Patient coming from:    home Lives With family    Chief Complaint:   Chief Complaint  Patient presents with   Extremity Weakness   Leg Pain    HPI: Lisa Maxwell is a 77 y.o. female with medical history significant of   A-fib on Eliquis, HTN, COPD, diabetes, HLD, obesity, history of pulmonary embolism on Eliquis, CAD, history of cauda equina syndrome bilateral low back pain with sciatica, CKD, chronic pain syndrome PVD    Presented with left foot pain and weakness Right foot weakness and already has a brace for this.  The leg has been weak for at least a week and a half now both legs are hurting A week ago she has fallen down she slumped against the wall and slid down.  As she did so her left leg got caught underneath her since the fall she has been having severe pain in her left foot.  She uses a walker at baseline and now has to be wheelchair-bound with assistance of her husband.  No fever no chills no cough no shortness of breath Patient is on Eliquis    Regarding pertinent Chronic problems:     Hyperlipidemia -  on statins mevacor Lipid Panel      HTN on NO T ON metoprolol      CAD  - On Aspirin, statin, betablocker, Plavix                 - *followed by cardiology                - last cardiac cath  The ASCVD Risk score (Arnett DK, et al., 2019) failed to calculate for the following reasons:   Cannot find a previous HDL lab   Cannot find a previous total cholesterol lab     DM 2 -  Lab Results  Component Value Date   HGBA1C 6.5 (H) 08/26/2021   PO meds only,    obesity-   BMI Readings from Last 1 Encounters:  06/09/22 35.20 kg/m        COPD -  followed by pulmonology   not  on baseline  oxygen     OSA -on  CPAP  A. Fib -  - CHA2DS2 vas score      8       current  on anticoagulation with  Eliquis,           -  Rate control:  Currently controlled with   Metoprolol,*    Hx of DVT/PE on - anticoagulation with   Eliquis,    CKD stage IIIb- baseline Cr 1.3 Estimated Creatinine Clearance: 27.6 mL/min (A) (by C-G formula based on SCr of 1.4 mg/dL (H)).  Lab Results  Component Value Date   CREATININE 1.40 (H) 06/09/2022   CREATININE 1.56 (H) 11/24/2021   CREATININE 1.30 (H) 11/22/2021       While in ER:    Imaging showed no fracture of the left leg but there is evidence of L1 inferior endplate fracture which is subacute Neurosurgery is consulted recommend placing patient in brace and pain management  will  see     CT HEAD   NON acute  CXR -  NON acute  CT thoracic Cervical and lumber -  insufficiency fracture at L1 with loss of height anteriorly of 30%. Circumferential bulging of the disc. Multifactorial stenosis at this level that could be significant. Pedicle screw on the right comes very close to or breaches the superior endplate.   MR lumbar spine -  . L1 inferior endplate fracture which is subacute to chronic. L1 and L2 vertebral body edema which could be degenerative or traumatic. . T11-12 severe degeneration with spinal stenosis and apparent cord impingement that is likely accentuated by susceptibility artifact from spinal cord stimulator leads. On axial T2 weighted imaging there is question of lower cord edema, correlate for myelopathy. Severe biforaminal impingement at this level.  Following Medications were ordered in ER: Medications  HYDROmorphone (DILAUDID) injection 0.5 mg (0.5 mg Intravenous Given 06/09/22 2111)    _______________________________________________________ ER Provider Called:  neurosurgery   Dr. Kathyrn Sheriff  They Recommend admit to medicine  Will see in AM   Place TLSO     ED Triage Vitals  Enc Vitals Group     BP  06/09/22 1045 (!) 118/91     Pulse Rate 06/09/22 1045 99     Resp 06/09/22 1045 18     Temp 06/09/22 1040 98.4 F (36.9 C)     Temp Source 06/09/22 1040 Oral     SpO2 06/09/22 1045 95 %     Weight 06/09/22 1041 157 lb (71.2 kg)     Height 06/09/22 1041 '4\' 8"'$  (1.422 m)     Head Circumference --      Peak Flow --      Pain Score 06/09/22 1040 8     Pain Loc --      Pain Edu? --      Excl. in [place]? --   TMAX(24)@     _________________________________________ Significant initial  Findings: Abnormal Labs Reviewed  CBC WITH DIFFERENTIAL/PLATELET - Abnormal; Notable for the following components:      Result Value   WBC 11.7 (*)    Hemoglobin 15.1 (*)    HCT 47.3 (*)    Neutro Abs 9.9 (*)    All other components within normal limits  COMPREHENSIVE METABOLIC PANEL - Abnormal; Notable for the following components:   Glucose, Bld 120 (*)    Creatinine, Ser 1.40 (*)    Calcium 10.8 (*)    GFR, Estimated 39 (*)    All other components within normal limits     _________________________ Troponin 8  ECG: Ordered Personally reviewed and interpreted by me showing: HR : 114 Rhythm:Atrial fibrillation Ventricular premature complex Right bundle branch block QTC 406   The recent clinical data is shown below. Vitals:   06/09/22 1730 06/09/22 2030 06/09/22 2038 06/09/22 2100  BP: (!) 140/83 136/71  117/78  Pulse: (!) 108 (!) 125  (!) 128  Resp: '16 20  17  '$ Temp:   98.7 F (37.1 C)   TempSrc:   Oral   SpO2: 90% 95%  95%  Weight:      Height:         WBC     Component Value Date/Time   WBC 11.7 (H) 06/09/2022 1102   LYMPHSABS 1.0 06/09/2022 1102   MONOABS 0.7 06/09/2022 1102   EOSABS 0.1 06/09/2022 1102   BASOSABS 0.0 06/09/2022 1102      UA  ordered    _______________________________________________ Hospitalist was called for admission for L1 fracture   The following Work up has been  ordered so far:  Orders Placed This Encounter  Procedures   DG Foot Complete Left    DG Ankle 2 Views Left   CT Head Wo Contrast   CT Cervical Spine Wo Contrast   CT Lumbar Spine Wo Contrast   CT Thoracic Spine Wo Contrast   DG Chest 1 View   MR LUMBAR SPINE WO CONTRAST   CBC with Differential   Comprehensive metabolic panel   Brain natriuretic peptide   Apply TLSO brace   Maintain TLSO brace   Consult to neurosurgery   Consult to neurosurgery   Consult to hospitalist   EKG 12-Lead   Saline lock IV     OTHER Significant initial  Findings:  labs showing:  Recent Labs  Lab 06/09/22 1102  NA 136  K 4.6  CO2 24  GLUCOSE 120*  BUN 22  CREATININE 1.40*  CALCIUM 10.8*    Cr  stable,   Lab Results  Component Value Date   CREATININE 1.40 (H) 06/09/2022   CREATININE 1.56 (H) 11/24/2021   CREATININE 1.30 (H) 11/22/2021    Recent Labs  Lab 06/09/22 1102  AST 35  ALT 25  ALKPHOS 65  BILITOT 0.6  PROT 6.7  ALBUMIN 3.8   Lab Results  Component Value Date   CALCIUM 10.8 (H) 06/09/2022          Plt: Lab Results  Component Value Date   PLT 207 06/09/2022     COVID-19 Labs  No results for input(s): "DDIMER", "FERRITIN", "LDH", "CRP" in the last 72 hours.  Lab Results  Component Value Date   SARSCOV2NAA NEGATIVE 11/21/2021   [place] NEGATIVE 07/04/2019       Recent Labs  Lab 06/09/22 1102  WBC 11.7*  NEUTROABS 9.9*  HGB 15.1*  HCT 47.3*  MCV 96.7  PLT 207    HG/HCT stable,     Component Value Date/Time   HGB 15.1 (H) 06/09/2022 1102   HCT 47.3 (H) 06/09/2022 1102   MCV 96.7 06/09/2022 1102     Cardiac Panel (last 3 results) No results for input(s): "CKTOTAL", "CKMB", "TROPONINI", "RELINDX" in the last 72 hours.  .car BNP (last 3 results) Recent Labs    11/21/21 0955 06/09/22 1108  BNP 101.1* 93.3    DM  labs:  HbA1C: Recent Labs    08/26/21 1600  HGBA1C 6.5*       CBG (last 3)  No results for input(s): "GLUCAP" in the last 72 hours.        Cultures:    Component Value Date/Time   SDES BLOOD  BLOOD RIGHT FOREARM 11/21/2021 1508   SPECREQUEST  11/21/2021 1508    BOTTLES DRAWN AEROBIC AND ANAEROBIC Blood Culture adequate volume   CULT  11/21/2021 1508    NO GROWTH 5 DAYS Performed at [facility], [place] [address] [place], [place] [address]    REPTSTATUS 11/26/2021 FINAL 11/21/2021 1508     Radiological Exams on Admission: MR LUMBAR SPINE WO CONTRAST  Result Date: 06/09/2022 CLINICAL DATA:  Right foot drop for a week and a half. EXAM: MRI LUMBAR SPINE WITHOUT CONTRAST TECHNIQUE: Multiplanar, multisequence MR imaging of the lumbar spine was performed. No intravenous contrast was administered. COMPARISON:  Lumbar CT from earlier the same day FINDINGS: Segmentation:  Standard. Alignment:  Dextroscoliosis. Vertebrae: L1 inferior endplate fracture with [id number] height loss measured by CT. Fracture is potentially subacute by CT. Mild concavity of the L2 superior endplate as well. Discogenic endplate edema at the  L1 and L2 bodies. No aggressive bone lesion. Conus medullaris and cauda equina: Conus extends to the T12-L1 level. The conus appears T2 hyperintense at the level of the gray matter although only seen at levels where there is artifact from spinal cord stimulator. Paraspinal and other soft tissues: Postoperative scarring and atrophy of intrinsic back muscles. Sigmoid diverticulosis. Disc levels: T10-11: Disc collapse with endplate and facet spurring causing biforaminal impingement and at least moderate spinal stenosis. T11-12: Disc collapse and endplate degeneration with circumferential bulging and facet spurring. Biforaminal impingement. Signs of high-grade spinal stenosis although likely exacerbated by artifact from the hardware. T12- L1: Degenerative facet spurring asymmetric to the right. The canal and foramina are patent L1-L2: Disc narrowing and bulging with endplate spurring. Moderate spinal stenosis. Left more than right foraminal impingement. L2-L3: Fusion with no visible impingement  L3-L4: Degenerative facet spurring which is bulky. Circumferential disc bulging with left foraminal annular fissure. Moderate spinal stenosis. Moderate right foraminal narrowing L4-L5: Degenerative facet spurring with bulky hypertrophy asymmetric to the right. Mild anterolisthesis and circumferential disc bulging. Moderate spinal stenosis. Right foraminal impingement. L5-S1:Bulky degenerative facet spurring. IMPRESSION: 1. Advanced and generalized lumbar spine degeneration with scoliosis and L4-5 anterolisthesis. 2. L1 inferior endplate fracture which is subacute to chronic. L1 and L2 vertebral body edema which could be degenerative or traumatic. 3. T11-12 severe degeneration with spinal stenosis and apparent cord impingement that is likely accentuated by susceptibility artifact from spinal cord stimulator leads. On axial T2 weighted imaging there is question of lower cord edema, correlate for myelopathy. Severe biforaminal impingement at this level. 4. Foraminal stenosis on the right at L1-2, L3-4, and L4-5. 5. Left foraminal stenosis primarily at L1-2. 6. Moderate spinal stenosis at L3-4 and L4-5. Electronically Signed   By: Jorje Guild M.D.   On: 06/09/2022 19:58   CT Thoracic Spine Wo Contrast  Result Date: 06/09/2022 CLINICAL DATA:  Back trauma EXAM: CT THORACIC SPINE WITHOUT CONTRAST TECHNIQUE: Multidetector CT images of the thoracic were obtained using the standard protocol without intravenous contrast. RADIATION DOSE REDUCTION: This exam was performed according to the departmental dose-optimization program which includes automated exposure control, adjustment of the mA and/or kV according to patient size and/or use of iterative reconstruction technique. COMPARISON:  None Available. FINDINGS: Alignment: Normal. Vertebrae: No acute fracture or focal pathologic process. The see separately dictated lumbar spine CT for description of osseous changes of the L1 vertebral body level. Paraspinal and other soft  tissues: Small amount of secretions in the esophagus. Moderate centrilobular emphysema. Ground-glass opacity in the right lobe, nonspecific. Gallbladder is partially imaged and appears distended. Right basilar atelectasis. Is mild nonspecific soft tissue stranding of the GE junction (series 19, image 105). Large left-sided thyroid nodule measuring up to 1.8 cm. Disc levels: Spinal nerve stimulator leads terminate at the T6-T9 levels. IMPRESSION: 1. No acute fracture or traumatic malalignment of the thoracic spine. 2. Small amount of secretions/debris in the esophagus with mild nonspecific soft tissue stranding at the GE junction. Correlate for symptoms of dysphagia. Ground-glass opacity in the right upper lobe, nonspecific. 3. Spinal nerve stimulator leads terminate at the T6-T9 levels. 4. 1.8 cm left thyroid nodule. Recommend further evaluation with a dedicated thyroid ultrasound. 5. Please refer to the separately dictated lumbar spine CT for description of osseous changes of the L1 vertebral body level. Emphysema (ICD10-J43.9). Electronically Signed   By: Marin Roberts M.D.   On: 06/09/2022 13:01   CT Lumbar Spine Wo Contrast  Result Date: 06/09/2022 CLINICAL  DATA:  Right foot drop. Worsening leg weakness over the last 2 weeks. Back trauma. EXAM: CT LUMBAR SPINE WITHOUT CONTRAST TECHNIQUE: Multidetector CT imaging of the lumbar spine was performed without intravenous contrast administration. Multiplanar CT image reconstructions were also generated. RADIATION DOSE REDUCTION: This exam was performed according to the departmental dose-optimization program which includes automated exposure control, adjustment of the mA and/or kV according to patient size and/or use of iterative reconstruction technique. COMPARISON:  Radiography 09/22/2021.  MRI 08/15/2021 FINDINGS: Segmentation: 5 lumbar type vertebral bodies as numbered previously. Alignment: Lower thoracic and upper lumbar curvature convex to the right.  Vertebrae: Chronic degenerative endplate changes at [id number]. Compression fracture/insufficiency fracture of the inferior endplate of L1. Probable solid union at the L2-3 fusion level. Paraspinal and other soft tissues: Negative. Neurostimulator in place entering the T12-L1 interlaminar space. Disc levels: T11-12: Chronic disc degeneration with loss of disc height. Chronic endplate sclerosis and cystic change. Circumferential protrusion of the disc. Potential for significant stenosis at this level. T12-L1: Disc bulge. No compressive stenosis. Neurostimulator enters posteriorly at this level. L1-2: Progressive disc degeneration with vacuum phenomenon. Inferior endplate compression fracture or insufficiency fracture at L1 with loss of height anteriorly of 30%. Circumferential bulging of the disc. Multifactorial stenosis at this level that could be significant. Pedicle screw on the right comes very close to or breaches the superior endplate. L2-3: Previous discectomy and fusion procedure. Probable solid union. Sufficient decompression of the canal. Some potential for ongoing foraminal stenosis on the right. L3-4: Bulging of the disc. Facet and ligamentous hypertrophy. Moderate multifactorial stenosis. Bilateral foraminal stenosis. L4-5: Chronic facet arthropathy with anterolisthesis of 3 mm. Bulging of the disc. Moderate multifactorial stenosis. Bilateral foraminal stenosis. L5-S1: Bulging of the disc. Bilateral facet degeneration and hypertrophy. Stenosis of the subarticular lateral recesses. Sufficient patency of the central canal and neural foramina. Bilateral sacroiliac osteoarthritis is present IMPRESSION: 1. T11-12: Chronic disc degeneration with loss of disc height. Chronic endplate sclerosis and cystic change. Circumferential protrusion of the disc. Potential for significant stenosis at this level. 2. L1-2: Progressive disc degeneration with vacuum phenomenon. Inferior endplate compression fracture or  insufficiency fracture at L1 with loss of height anteriorly of 30%. Circumferential bulging of the disc. Multifactorial stenosis at this level that could be significant. Pedicle screw on the right comes very close to or breaches the superior endplate. 3. L2-3: Previous discectomy and fusion procedure. Probable solid union. Sufficient decompression of the canal. Some potential for ongoing foraminal stenosis on the right. 4. L3-4: Moderate multifactorial stenosis. Bilateral foraminal stenosis. 5. L4-5: Chronic facet arthropathy with anterolisthesis of 3 mm. Bulging of the disc. Moderate multifactorial stenosis. Bilateral foraminal stenosis. 6. L5-S1: Bilateral facet degeneration and hypertrophy. Stenosis of the subarticular lateral recesses. Sufficient patency of the central canal and neural foramina. Electronically Signed   By: Nelson Chimes M.D.   On: 06/09/2022 12:58   CT Head Wo Contrast  Result Date: 06/09/2022 CLINICAL DATA:  Head trauma, minor (Age >= 65y); Neck trauma (Age >= 65y) EXAM: CT HEAD WITHOUT CONTRAST CT CERVICAL SPINE WITHOUT CONTRAST TECHNIQUE: Multidetector CT imaging of the head and cervical spine was performed following the standard protocol without intravenous contrast. Multiplanar CT image reconstructions of the cervical spine were also generated. RADIATION DOSE REDUCTION: This exam was performed according to the departmental dose-optimization program which includes automated exposure control, adjustment of the mA and/or kV according to patient size and/or use of iterative reconstruction technique. COMPARISON:  11/21/2021 FINDINGS: CT HEAD FINDINGS Brain: No evidence  of acute infarction, hemorrhage, hydrocephalus, extra-axial collection or mass lesion/mass effect. Chronic small left occipital lobe infarct. Patchy low-density changes within the periventricular and subcortical white matter most compatible with chronic microvascular ischemic change. Mild diffuse cerebral volume loss. Vascular:  Atherosclerotic calcifications involving the large vessels of the skull base. No unexpected hyperdense vessel. Skull: Normal. Negative for fracture or focal lesion. Sinuses/Orbits: No acute finding. 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 Other: Negative for scalp hematoma. CT CERVICAL SPINE FINDINGS Alignment: Facet joints are aligned without dislocation or traumatic listhesis. Dens and lateral masses are aligned. Grade 1 anterolisthesis of C3 on C4, C4 on C5, and C5 on C6 mediated by degenerative facet arthropathy. Skull base and vertebrae: No acute fracture. No suspicious lytic or sclerotic bone lesion. There is a 1.5 cm well-circumscribed bony protuberance arising posteriorly from the left C3 lamina, likely reflecting an osteochondroma. Soft tissues and spinal canal: No prevertebral fluid or swelling. No visible canal hematoma. Disc levels: Moderate-advanced multilevel cervical spondylosis with both degenerative disc disease and facet arthropathy. Upper chest: Mild emphysematous changes at the lung apices. Other: Bilateral carotid atherosclerosis. IMPRESSION: 1. No acute intracranial abnormality. 2. No acute fracture or traumatic listhesis of the cervical spine. Electronically Signed   By: Davina Poke D.O.   On: 06/09/2022 12:56   CT Cervical Spine Wo Contrast  Result Date: 06/09/2022 CLINICAL DATA:  Head trauma, minor (Age >= 65y); Neck trauma (Age >= 65y) EXAM: CT HEAD WITHOUT CONTRAST CT CERVICAL SPINE WITHOUT CONTRAST TECHNIQUE: Multidetector CT imaging of the head and cervical spine was performed following the standard protocol without intravenous contrast. Multiplanar CT image reconstructions of the cervical spine were also generated. RADIATION DOSE REDUCTION: This exam was performed according to the departmental dose-optimization program which includes automated exposure control, adjustment of the mA and/or kV according to patient size and/or  use of iterative reconstruction technique. COMPARISON:  11/21/2021 FINDINGS: CT HEAD FINDINGS Brain: No evidence of acute infarction, hemorrhage, hydrocephalus, extra-axial collection or mass lesion/mass effect. Chronic small left occipital lobe infarct. Patchy low-density changes within the periventricular and subcortical white matter most compatible with chronic microvascular ischemic change. Mild diffuse cerebral volume loss. Vascular: Atherosclerotic calcifications involving the large vessels of the skull base. No unexpected hyperdense vessel. Skull: Normal. Negative for fracture or focal lesion. Sinuses/Orbits: No acute finding. 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 Other: Negative for scalp hematoma. CT CERVICAL SPINE FINDINGS Alignment: Facet joints are aligned without dislocation or traumatic listhesis. Dens and lateral masses are aligned. Grade 1 anterolisthesis of C3 on C4, C4 on C5, and C5 on C6 mediated by degenerative facet arthropathy. Skull base and vertebrae: No acute fracture. No suspicious lytic or sclerotic bone lesion. There is a 1.5 cm well-circumscribed bony protuberance arising posteriorly from the left C3 lamina, likely reflecting an osteochondroma. Soft tissues and spinal canal: No prevertebral fluid or swelling. No visible canal hematoma. Disc levels: Moderate-advanced multilevel cervical spondylosis with both degenerative disc disease and facet arthropathy. Upper chest: Mild emphysematous changes at the lung apices. Other: Bilateral carotid atherosclerosis. IMPRESSION: 1. No acute intracranial abnormality. 2. No acute fracture or traumatic listhesis of the cervical spine. Electronically Signed   By: Davina Poke D.O.   On: 06/09/2022 12:56   DG Foot Complete Left  Result Date: 06/09/2022 CLINICAL DATA:  Trauma, fall, pain EXAM: LEFT FOOT - COMPLETE 3+ VIEW COMPARISON:  None Available. FINDINGS: No  displaced fracture or  dislocation is seen. Osteopenia is seen in bony structures. Small plantar spur is seen in calcaneus. Small bony spurs are noted in the dorsal aspect of talonavicular joint. IMPRESSION: No recent fracture or dislocation is seen in left foot. Electronically Signed   By: Elmer Picker M.D.   On: 06/09/2022 12:15   DG Ankle 2 Views Left  Result Date: 06/09/2022 CLINICAL DATA:  Trauma, fall EXAM: LEFT ANKLE - 2 VIEW COMPARISON:  None Available. FINDINGS: No fracture or dislocation is seen. Osteopenia is seen in bony structures. Small plantar spur is seen in calcaneus. Small bony spurs are noted in the dorsal aspect of talonavicular joint. There is soft tissue swelling around the ankle. IMPRESSION: No fracture or dislocation is seen in left ankle. Electronically Signed   By: Elmer Picker M.D.   On: 06/09/2022 12:12   DG Chest 1 View  Result Date: 06/09/2022 CLINICAL DATA:  Fall EXAM: CHEST  1 VIEW COMPARISON:  11/21/21 CXR FINDINGS: Spinal nerve stimulator leads in place. Partially visualized lumbar spinal fusion hardware in place. No pleural effusion. No pneumothorax. Unchanged cardiac and mediastinal contours when accounting differences in positioning. No focal airspace opacity. No radiographically apparent displaced rib fractures. IMPRESSION: No radiographic evidence of thoracic injury. Electronically Signed   By: Marin Roberts M.D.   On: 06/09/2022 12:11   _______________________________________________________________________________________________________ Latest  Blood pressure 117/78, pulse (!) 128, temperature 98.7 F (37.1 C), temperature source Oral, resp. rate 17, height '4\' 8"'$  (1.422 m), weight 71.2 kg, SpO2 95 %.   Vitals  labs and radiology finding personally reviewed  Review of Systems:    Pertinent positives include:  back pain , left leg weakness  Constitutional:  No weight loss, night sweats, Fevers, chills, fatigue, weight loss  HEENT:  No headaches, Difficulty  swallowing,Tooth/dental problems,Sore throat,  No sneezing, itching, ear ache, nasal congestion, post nasal drip,  Cardio-vascular:  No chest pain, Orthopnea, PND, anasarca, dizziness, palpitations.no Bilateral lower extremity swelling  GI:  No heartburn, indigestion, abdominal pain, nausea, vomiting, diarrhea, change in bowel habits, loss of appetite, melena, blood in stool, hematemesis Resp:  no shortness of breath at rest. No dyspnea on exertion, No excess mucus, no productive cough, No non-productive cough, No coughing up of blood.No change in color of mucus.No wheezing. Skin:  no rash or lesions. No jaundice GU:  no dysuria, change in color of urine, no urgency or frequency. No straining to urinate.  No flank pain.  Musculoskeletal:  No joint pain or no joint swelling. No decreased range of motion. No back pain.  Psych:  No change in mood or affect. No depression or anxiety. No memory loss.  Neuro: no localizing neurological complaints, no tingling, no weakness, no double vision, no gait abnormality, no slurred speech, no confusion  All systems reviewed and apart from [place] all are negative _______________________________________________________________________________________________ Past Medical History:   Past Medical History:  Diagnosis Date   CAD (coronary artery disease) 11/04/2016   Cancer ([place])    skin cancer   Cauda equina compression ([place]) 08/28/2021   Cerebrovascular disease    Chronic atrial fibrillation ([place]) 09/28/2018   Chronic bilateral low back pain without sciatica 03/08/2018   Chronic kidney disease    Chronic obstructive lung disease ([place]) 09/28/2018   Chronic pain syndrome 03/08/2018   Chronic renal failure 09/28/2018   Chronic, continuous use of opioids 03/04/2021   Last Assessment & Plan:  Formatting of this note might be different from the original. Patient and  I have discussed the hazardous effects of continued opiate pain medication usage. Risks and  benefits of above medications including but not limited to possibility of respiratory depression, sedation, and even death were discussed with the patient who expressed an understanding.  Patient did not displ   CKD (chronic kidney disease) stage 3, GFR 30-59 ml/min (HCC)    Class 1 obesity due to excess calories with serious comorbidity and body mass index (BMI) of 34.0 to 34.9 in adult 05/20/2021   Last Assessment & Plan: Formatting of this note might be different from the original. Patient educated about the detrimental effects of weight as it specifically pertains to pain management and overall health.  Patient's BMI 34.08 Encouraged healthy eating habits and routine low-impact cardiovascular exercises as tolerated.   Comprehensive diabetic foot examination, type 2 DM, encounter for ([place]) 04/29/2021   DDD (degenerative disc disease), lumbar 11/17/2018   Last Assessment & Plan:  Formatting of this note might be different from the original. See spinal stenosis plan   Diabetes ([place])    Diabetes mellitus due to underlying condition with unspecified complications ([place]) [id number]   Dyshidrotic eczema 04/29/2021   Dysrhythmia    afib   Essential hypertension 11/04/2016   Facet arthritis of lumbar region 03/08/2018   Facet joint disease 11/17/2018   Last Assessment & Plan:  Formatting of this note might be different from the original. See spinal stenosis plan   Foraminal stenosis of lumbar region 03/08/2018   Heart murmur    History of knee replacement, total, bilateral 11/17/2018   Last Assessment & Plan:  Formatting of this note might be different from the original. Have recommended to her several times if any further concerns in regards to the pain status post arthroplasty, she is to follow up with her surgeon.   History of pulmonary embolism 11/28/2021   Hypertension    Hypertensive disorder 09/28/2018   Increased body mass index 09/28/2018   Lumbar radiculopathy 03/08/2018   Lumbar  spondylosis 03/20/2019   Mixed dyslipidemia 11/04/2016   Morbid obesity ([place]) 11/04/2016   Muscle pain 04/29/2020   Osteoarthritis of left knee    PAF (paroxysmal atrial fibrillation) ([place]) 04/30/2015   Pain management contract agreement 04/13/2019   Last Assessment & Plan:  Formatting of this note might be different from the original. Contract updated today.  UDS completed.  [place] controlled substance registry reviewed and is consistent with her regimen.   Peripheral vascular disease ([place]) 09/28/2018   Preop cardiovascular exam 05/01/2019   Pressure injury of skin 11/21/2021   Pulmonary emboli [facility])    Pulmonary embolism ([place]) 11/21/2021   Renal insufficiency 04/30/2015   S/P insertion of spinal cord stimulator 07/13/2019   Last Assessment & Plan:  Formatting of this note might be different from the original. About 5 months status post insertion of spinal cord stimulator with excellent relief.  She continues to keep close follow-up with her representative and has been working to appropriately find best programs to get her adequate pain relief.   Sleep apnea    Smoker 09/28/2018   Spinal stenosis of lumbar region 03/20/2019   Spinal stenosis of lumbar region with neurogenic claudication 03/08/2018   Last Assessment & Plan:  Formatting of this note is different from the original. 77 year old female with chronic low back pain and bilateral, right greater than left, L4-5 radicular pain, and claudication. She has substantial amount of multifactorial degenerative thoracic and lumbar spine pathology.  After being deemed an inappropriate open surgical  candidate, and failing to respond to multiple in   Spondylolisthesis 03/20/2019   Spondylosis of lumbosacral region without myelopathy or radiculopathy 01/30/2019   Last Assessment & Plan:  Formatting of this note might be different from the original. See spinal stenosis plan   Tinea unguium 02/03/2022   Type 2 diabetes mellitus ([place])  09/28/2018      Past Surgical History:  Procedure Laterality Date   ABDOMINAL HYSTERECTOMY     BACK SURGERY     EYE SURGERY     bilateral cataracts   IR ANGIOGRAM PULMONARY BILATERAL SELECTIVE  11/21/2021   IR ANGIOGRAM SELECTIVE EACH ADDITIONAL VESSEL  11/21/2021   IR ANGIOGRAM SELECTIVE EACH ADDITIONAL VESSEL  11/21/2021   IR THROMBECT PRIM MECH INIT (INCLU) MOD SED  11/21/2021   IR US GUIDE VASC ACCESS RIGHT  11/21/2021   JOINT REPLACEMENT Bilateral    knees   RADIOLOGY WITH ANESTHESIA N/A 11/21/2021   Procedure: IR WITH ANESTHESIA;  Surgeon: Radiologist, Medication, MD;  Location: [facility];  Service: Radiology;  Laterality: N/A;   SPINAL CORD STIMULATOR INSERTION N/A 07/07/2019   Procedure: LUMBAR SPINAL CORD STIMULATOR INSERTION;  Surgeon: Clydell Hakim, MD;  Location: [facility];  Service: Neurosurgery;  Laterality: N/A;   total left knee      Social History:  Ambulatory    wheelchair bound,       reports that she quit smoking about 3 years ago. Her smoking use included cigarettes. She has never used smokeless tobacco. She reports that she does not drink alcohol and does not use drugs.   Family History:   Family History  Problem Relation Age of Onset   Diabetes Mother    ______________________________________________________________________________________________ Allergies: No Known Allergies   Prior to Admission medications   Medication Sig Start Date End Date Taking? Authorizing Provider  apixaban (ELIQUIS) 5 MG TABS tablet Take 1 tablet (5 mg total) by mouth 2 (two) times daily. 11/28/21   Revankar, Reita Cliche, MD  azithromycin (ZITHROMAX) 250 MG tablet Take as directed 03/13/22   Freddi Starr, MD  Coenzyme Q10 (CO Q-10) 200 MG CAPS Take 200 mg by mouth in the morning.    [provider]  Cyanocobalamin (VITAMIN B-12) 500 MCG SUBL Place 500 mcg under the tongue in the morning.    [provider]  diphenhydrAMINE (BENADRYL) 25 MG tablet Take 25 mg by  mouth in the morning.    [provider]  docusate sodium (COLACE) 100 MG capsule Take 100 mg by mouth daily. 10/08/21   [provider]  fluticasone furoate-vilanterol (BREO ELLIPTA) 100-25 MCG/ACT AEPB Inhale 1-2 puffs into the lungs daily.    [provider]  gabapentin (NEURONTIN) 600 MG tablet Take 600 mg by mouth at bedtime. 04/06/21   [provider]  HYDROcodone-acetaminophen (NORCO) 10-325 MG tablet Take 0.5 tablets by mouth 4 (four) times daily.    [provider]  Iron, Ferrous Sulfate, 325 (65 Fe) MG TABS Take 65 mg of iron by mouth daily with breakfast.    [provider]  latanoprost (XALATAN) 0.005 % ophthalmic solution Place 1 drop into both eyes at bedtime. 11/17/21   [provider]  lovastatin (MEVACOR) 40 MG tablet Take 40 mg by mouth at bedtime.     [provider]  methocarbamol (ROBAXIN) 500 MG tablet Take 1 tablet (500 mg total) by mouth every 6 (six) hours as needed for muscle spasms. Patient taking differently: Take 250 mg by mouth See admin instructions. Take  250 mg by mouth three to four times a day 08/29/21   Consuella Lose, MD  metoprolol tartrate (LOPRESSOR) 25 MG tablet Take 0.5 tablets (12.5 mg total) by mouth 2 (two) times daily. 11/24/21   Ghimire, Henreitta Leber, MD  Multiple Vitamins-Minerals (CENTRUM SILVER 50+WOMEN) TABS Take 1 tablet by mouth daily with breakfast.    [provider]  naloxone (NARCAN) nasal spray 4 mg/0.1 mL Place 1 spray into the nose daily as needed (accidental overdose).    [provider]  nitroGLYCERIN (NITROSTAT) 0.4 MG SL tablet Place 0.4 mg under the tongue every 5 (five) minutes as needed for chest pain.    [provider]  NONFORMULARY OR COMPOUNDED ITEM Please evaluate and treat for outpatient PT Diagnoses: Deconditioning due to Acute Hypoxia/Shock from saddle embolus 11/24/21   Jonetta Osgood, MD  pantoprazole (PROTONIX) 40 MG tablet Take 1  tablet (40 mg total) by mouth daily. 11/25/21   Ghimire, Henreitta Leber, MD  PROAIR HFA 108 [phone] Base) MCG/ACT inhaler Inhale 2 puffs into the lungs every 4 (four) hours as needed for wheezing or shortness of breath. 10/12/16   [provider]  RYBELSUS 3 MG TABS Take 3 mg by mouth daily before breakfast. 05/12/21   [provider]  triamcinolone ointment (KENALOG) 0.5 % Apply 1 Application topically See admin instructions. Apply to affected areas of both feet two to three times a week    [provider]  trimethoprim (TRIMPEX) 100 MG tablet Take 100 mg by mouth in the morning. 10/25/21   [provider]    ___________________________________________________________________________________________________ Physical Exam:    06/09/2022    9:00 PM 06/09/2022    8:30 PM 06/09/2022    5:30 PM  Vitals with BMI  Systolic [id number]  Diastolic 78 71 83  Pulse 0000000 125 108     1. General:  in No  Acute distress   Chronically ill   2. Psychological: Alert and   Oriented 3. Head/ENT:   Dry Mucous Membranes                          Head Non traumatic, neck supple                         Poor Dentition 4. SKIN: decreased Skin turgor,  Skin clean Dry and intact no rash 5. Heart: Regular rate and rhythm no  Murmur, no Rub or gallop 6. Lungs:  no wheezes or crackles   7. Abdomen: Soft,  non-tender, Non distended   obese  bowel sounds present 8. Lower extremities: no clubbing, cyanosis,  edema bilateral chronic 9. Neurologically   strength decreased on lower extremities L>R 10. MSK: Normal range of motion    Chart has been reviewed  ______________________________________________________________________________________________  Assessment/Plan 77 y.o. female with medical history significant of   A-fib on Eliquis, HTN, COPD, diabetes, HLD, obesity, history of pulmonary embolism on Eliquis, CAD, history of cauda equina syndrome bilateral low back pain with sciatica, CKD,  chronic pain syndrome PVD  Admitted for L29facture    Present on Admission:  L1 vertebral fracture (HCC)  CKD (chronic kidney disease) stage 3, GFR 30-59 ml/min (HCC)  CAD (coronary artery disease)  Essential hypertension  Diabetes mellitus due to underlying condition with unspecified complications (HCC)  Mixed dyslipidemia  Morbid obesity (HCC)  PAF (paroxysmal atrial fibrillation) (HCC)  Chronic obstructive lung disease ([place]  History of pulmonary  embolus (PE)  Foraminal stenosis of lumbar region  Esophageal abnormality  OSA (obstructive sleep apnea)     CKD (chronic kidney disease) stage 3, GFR 30-59 ml/min (HCC)  -chronic avoid nephrotoxic medications such as NSAIDs, Vanco Zosyn combo,  avoid hypotension, continue to follow renal function   CAD (coronary artery disease) Chronic stable continue Mevacor 40 mg a day no longer takes metoprolol  Essential hypertension Allow permissive htn soft bp  Diabetes mellitus due to underlying condition with unspecified complications ([place]) Order sliding scale hold po meds  Mixed dyslipidemia Continue Mevacor to 40 mg a day  Morbid obesity ([place]) Will need outpatient follow-up with nutrition  PAF (paroxysmal atrial fibrillation) (HCC) Continue Eliquis 5 mg twice daily Can try gently fluids  BP soft to start metoprolol And HR elevated non consistetnly  Chronic obstructive lung disease (HCC) Chronic stable continue home medications  History of pulmonary embolus (PE) Continue Eliquis 5 mg twice daily  Foraminal stenosis of lumbar region Chronic contributing to long-term back pain  Esophageal abnormality Will order swallow eval to further clarify, if abnormal may need Gi consult Pt denies dysphagia  OSA (obstructive sleep apnea) Order CPAP   Other plan as per orders.  DVT prophylaxis:  eliquis    Code Status:    Code Status: Prior FULL CODE  as per patient   I had personally discussed CODE STATUS with patient and  family      Family Communication:   Family  at  Bedside  plan of care was discussed   with   Husband,    Disposition Plan:     likely will need placement for rehabilitation                        Following barriers for discharge:                            Electrolytes corrected                                                          Pain controlled with PO medications                                                           Will need consultants to evaluate patient prior to discharge                      Would benefit from PT/OT eval prior to DC  Ordered                      Consults called: neurosurgery   Admission status:  ED Disposition     ED Disposition  [place]: [place] [[id number]]  Level of Care: Progressive [102]  Admit to Progressive based on following criteria: CARDIOVASCULAR & THORACIC of moderate stability with acute coronary syndrome symptoms/low risk myocardial infarction/hypertensive urgency/arrhythmias/heart failure potentially compromising stability and stable post cardiovascular intervention patients.  May place patient in observation at  [place] or [place] if equivalent level of care is available:: No  Covid Evaluation: Asymptomatic - no recent exposure (last 10 days) testing not required  [facility]: [id number]  Admitting Physician: Toy Baker [[id number]]  Attending Physician: Toy Baker [[id number]]           Obs    Level of care      progressive tele indefinitely please discontinue once patient no longer qualifies COVID-19 Labs    Zaryia Markel 06/09/2022, 10:29 PM    [facility]     after 2 AM please page floor coverage PA If 7AM-7PM, please contact the day team taking care of the patient using [email or web address]   Patient was evaluated in the context of the global COVID-19 pandemic, which necessitated consideration that the patient might  be at risk for infection with the SARS-CoV-2 virus that causes COVID-19. Institutional protocols and algorithms that pertain to the evaluation of patients at risk for COVID-19 are in a state of rapid change based on information released by regulatory bodies including the CDC and federal and state organizations. These policies and algorithms were followed during the patient's care.

## 2022-06-09 NOTE — Assessment & Plan Note (Addendum)
Will order swallow eval to further clarify, if abnormal may need Gi consult Pt denies dysphagia

## 2022-06-09 NOTE — Assessment & Plan Note (Addendum)
Chronic stable continue Mevacor 40 mg a day no longer takes metoprolol

## 2022-06-09 NOTE — Assessment & Plan Note (Signed)
-  chronic avoid nephrotoxic medications such as NSAIDs, Vanco Zosyn combo,  avoid hypotension, continue to follow renal function  

## 2022-06-09 NOTE — Assessment & Plan Note (Addendum)
Allow permissive htn soft bp

## 2022-06-09 NOTE — Assessment & Plan Note (Signed)
Continue Eliquis 5 mg twice daily. 

## 2022-06-10 ENCOUNTER — Observation Stay (HOSPITAL_COMMUNITY): Payer: Medicare HMO

## 2022-06-10 ENCOUNTER — Encounter (HOSPITAL_COMMUNITY): Payer: Self-pay | Admitting: Internal Medicine

## 2022-06-10 DIAGNOSIS — J449 Chronic obstructive pulmonary disease, unspecified: Secondary | ICD-10-CM

## 2022-06-10 DIAGNOSIS — E088 Diabetes mellitus due to underlying condition with unspecified complications: Secondary | ICD-10-CM

## 2022-06-10 DIAGNOSIS — N1832 Chronic kidney disease, stage 3b: Secondary | ICD-10-CM | POA: Diagnosis not present

## 2022-06-10 DIAGNOSIS — K224 Dyskinesia of esophagus: Secondary | ICD-10-CM | POA: Diagnosis not present

## 2022-06-10 DIAGNOSIS — I10 Essential (primary) hypertension: Secondary | ICD-10-CM | POA: Diagnosis not present

## 2022-06-10 DIAGNOSIS — I2510 Atherosclerotic heart disease of native coronary artery without angina pectoris: Secondary | ICD-10-CM | POA: Diagnosis not present

## 2022-06-10 DIAGNOSIS — Z86711 Personal history of pulmonary embolism: Secondary | ICD-10-CM | POA: Diagnosis not present

## 2022-06-10 DIAGNOSIS — M48061 Spinal stenosis, lumbar region without neurogenic claudication: Secondary | ICD-10-CM

## 2022-06-10 DIAGNOSIS — S32019A Unspecified fracture of first lumbar vertebra, initial encounter for closed fracture: Secondary | ICD-10-CM | POA: Diagnosis not present

## 2022-06-10 DIAGNOSIS — E782 Mixed hyperlipidemia: Secondary | ICD-10-CM | POA: Diagnosis not present

## 2022-06-10 DIAGNOSIS — K2289 Other specified disease of esophagus: Secondary | ICD-10-CM | POA: Diagnosis not present

## 2022-06-10 LAB — CBC
HCT: 43.2 % (ref 36.0–46.0)
Hemoglobin: 14 g/dL (ref 12.0–15.0)
MCH: 30.8 pg (ref 26.0–34.0)
MCHC: 32.4 g/dL (ref 30.0–36.0)
MCV: 95.2 fL (ref 80.0–100.0)
Platelets: 188 10*3/uL (ref 150–400)
RBC: 4.54 MIL/uL (ref 3.87–5.11)
RDW: 13.6 % (ref 11.5–15.5)
WBC: 7.1 10*3/uL (ref 4.0–10.5)
nRBC: 0 % (ref 0.0–0.2)

## 2022-06-10 LAB — COMPREHENSIVE METABOLIC PANEL
ALT: 24 U/L (ref 0–44)
AST: 34 U/L (ref 15–41)
Albumin: 3.3 g/dL — ABNORMAL LOW (ref 3.5–5.0)
Alkaline Phosphatase: 60 U/L (ref 38–126)
Anion gap: 12 (ref 5–15)
BUN: 16 mg/dL (ref 8–23)
CO2: 19 mmol/L — ABNORMAL LOW (ref 22–32)
Calcium: 10 mg/dL (ref 8.9–10.3)
Chloride: 107 mmol/L (ref 98–111)
Creatinine, Ser: 1.31 mg/dL — ABNORMAL HIGH (ref 0.44–1.00)
GFR, Estimated: 42 mL/min — ABNORMAL LOW (ref >60)
Glucose, Bld: 120 mg/dL — ABNORMAL HIGH (ref 70–99)
Potassium: 4.6 mmol/L (ref 3.5–5.1)
Sodium: 138 mmol/L (ref 135–145)
Total Bilirubin: 0.6 mg/dL (ref 0.3–1.2)
Total Protein: 6.1 g/dL — ABNORMAL LOW (ref 6.5–8.1)

## 2022-06-10 LAB — GLUCOSE, CAPILLARY
Glucose-Capillary: 130 mg/dL — ABNORMAL HIGH (ref 70–99)
Glucose-Capillary: 119 mg/dL — ABNORMAL HIGH (ref 70–99)
Glucose-Capillary: 111 mg/dL — ABNORMAL HIGH (ref 70–99)
Glucose-Capillary: 123 mg/dL — ABNORMAL HIGH (ref 70–99)

## 2022-06-10 LAB — PHOSPHORUS: Phosphorus: 2.6 mg/dL (ref 2.5–4.6)

## 2022-06-10 LAB — MAGNESIUM: Magnesium: 1.8 mg/dL (ref 1.7–2.4)

## 2022-06-10 MED ORDER — METHOCARBAMOL 500 MG PO TABS
250.0000 mg | ORAL_TABLET | Freq: Four times a day (QID) | ORAL | Status: DC
  Administered 2022-06-10 – 2022-06-15 (×21): 250 mg via ORAL
  Filled 2022-06-10 (×21): qty 1

## 2022-06-10 MED ORDER — PRAVASTATIN SODIUM 40 MG PO TABS
40.0000 mg | ORAL_TABLET | Freq: Every day | ORAL | Status: DC
  Administered 2022-06-10 – 2022-06-19 (×9): 40 mg via ORAL
  Filled 2022-06-10 (×9): qty 1

## 2022-06-10 MED ORDER — LATANOPROST 0.005 % OP SOLN
1.0000 [drp] | Freq: Every day | OPHTHALMIC | Status: DC
  Administered 2022-06-10 – 2022-06-17 (×7): 1 [drp] via OPHTHALMIC
  Filled 2022-06-10 (×2): qty 2.5

## 2022-06-10 MED ORDER — GABAPENTIN 300 MG PO CAPS
600.0000 mg | ORAL_CAPSULE | Freq: Every day | ORAL | Status: DC
  Administered 2022-06-10 – 2022-06-19 (×9): 600 mg via ORAL
  Filled 2022-06-10 (×9): qty 2

## 2022-06-10 MED ORDER — DOCUSATE SODIUM 100 MG PO CAPS
100.0000 mg | ORAL_CAPSULE | Freq: Every day | ORAL | Status: DC
  Administered 2022-06-10 – 2022-06-20 (×10): 100 mg via ORAL
  Filled 2022-06-10 (×11): qty 1

## 2022-06-10 MED ORDER — ENSURE ENLIVE PO LIQD
237.0000 mL | Freq: Three times a day (TID) | ORAL | Status: DC
  Administered 2022-06-11 – 2022-06-20 (×19): 237 mL via ORAL

## 2022-06-10 MED ORDER — ADULT MULTIVITAMIN W/MINERALS CH
1.0000 | ORAL_TABLET | Freq: Every day | ORAL | Status: DC
  Administered 2022-06-11 – 2022-06-20 (×9): 1 via ORAL
  Filled 2022-06-10 (×10): qty 1

## 2022-06-10 MED ORDER — ACETAMINOPHEN 650 MG RE SUPP: 650.0000 mg | Freq: Four times a day (QID) | RECTAL | Status: DC | PRN

## 2022-06-10 MED ORDER — ONDANSETRON HCL 4 MG/2ML IJ SOLN
4.0000 mg | Freq: Four times a day (QID) | INTRAMUSCULAR | Status: DC | PRN
  Administered 2022-06-18: 4 mg via INTRAVENOUS
  Filled 2022-06-10: qty 2

## 2022-06-10 MED ORDER — FLUTICASONE FUROATE-VILANTEROL 100-25 MCG/ACT IN AEPB
1.0000 | INHALATION_SPRAY | Freq: Every day | RESPIRATORY_TRACT | Status: DC
  Administered 2022-06-10 – 2022-06-18 (×6): 1 via RESPIRATORY_TRACT
  Administered 2022-06-20: 2 via RESPIRATORY_TRACT
  Filled 2022-06-10 (×3): qty 28

## 2022-06-10 MED ORDER — SODIUM CHLORIDE 0.9 % IV SOLN: 250.0000 mL | INTRAVENOUS | Status: DC | PRN

## 2022-06-10 MED ORDER — HYDROCODONE-ACETAMINOPHEN 5-325 MG PO TABS
1.0000 | ORAL_TABLET | ORAL | Status: DC | PRN
  Administered 2022-06-10 – 2022-06-15 (×8): 2 via ORAL
  Filled 2022-06-10 (×8): qty 2

## 2022-06-10 MED ORDER — POLYETHYLENE GLYCOL 3350 17 G PO PACK: 17.0000 g | PACK | Freq: Every day | ORAL | Status: DC | PRN

## 2022-06-10 MED ORDER — SODIUM CHLORIDE 0.9% FLUSH
3.0000 mL | Freq: Two times a day (BID) | INTRAVENOUS | Status: DC
  Administered 2022-06-10 – 2022-06-20 (×19): 3 mL via INTRAVENOUS

## 2022-06-10 MED ORDER — APIXABAN 5 MG PO TABS
5.0000 mg | ORAL_TABLET | Freq: Two times a day (BID) | ORAL | Status: DC
  Administered 2022-06-10 – 2022-06-15 (×13): 5 mg via ORAL
  Filled 2022-06-10 (×14): qty 1

## 2022-06-10 MED ORDER — ONDANSETRON HCL 4 MG PO TABS
4.0000 mg | ORAL_TABLET | Freq: Four times a day (QID) | ORAL | Status: DC | PRN
  Administered 2022-06-17: 4 mg via ORAL
  Filled 2022-06-10: qty 1

## 2022-06-10 MED ORDER — ALBUTEROL SULFATE (2.5 MG/3ML) 0.083% IN NEBU: 2.5000 mg | INHALATION_SOLUTION | RESPIRATORY_TRACT | Status: DC | PRN

## 2022-06-10 MED ORDER — BISACODYL 10 MG RE SUPP
10.0000 mg | Freq: Every day | RECTAL | Status: DC | PRN
  Administered 2022-06-20: 10 mg via RECTAL
  Filled 2022-06-10: qty 1

## 2022-06-10 MED ORDER — SODIUM CHLORIDE 0.9% FLUSH
3.0000 mL | INTRAVENOUS | Status: DC | PRN
  Administered 2022-06-12: 3 mL via INTRAVENOUS

## 2022-06-10 MED ORDER — PANTOPRAZOLE SODIUM 40 MG PO TBEC
40.0000 mg | DELAYED_RELEASE_TABLET | Freq: Every day | ORAL | Status: DC
  Administered 2022-06-10 – 2022-06-18 (×8): 40 mg via ORAL
  Filled 2022-06-10 (×9): qty 1

## 2022-06-10 MED ORDER — FERROUS SULFATE 325 (65 FE) MG PO TABS
325.0000 mg | ORAL_TABLET | Freq: Every day | ORAL | Status: DC
  Administered 2022-06-10 – 2022-06-20 (×11): 325 mg via ORAL
  Filled 2022-06-10 (×11): qty 1

## 2022-06-10 MED ORDER — FENTANYL CITRATE PF 50 MCG/ML IJ SOSY
12.5000 ug | PREFILLED_SYRINGE | INTRAMUSCULAR | Status: DC | PRN
  Administered 2022-06-10 – 2022-06-13 (×2): 50 ug via INTRAVENOUS
  Filled 2022-06-10 (×2): qty 1

## 2022-06-10 MED ORDER — HALOPERIDOL LACTATE 5 MG/ML IJ SOLN
3.0000 mg | Freq: Once | INTRAMUSCULAR | Status: AC | PRN
  Administered 2022-06-11: 3 mg via INTRAVENOUS
  Filled 2022-06-10: qty 1

## 2022-06-10 MED ORDER — ACETAMINOPHEN 325 MG PO TABS
650.0000 mg | ORAL_TABLET | Freq: Four times a day (QID) | ORAL | Status: DC | PRN
  Administered 2022-06-14: 650 mg via ORAL
  Filled 2022-06-10: qty 2

## 2022-06-10 NOTE — Hospital Course (Signed)
Lisa Maxwell is a 77 y.o. female with past medical history of atrial fibrillation on Eliquis, hypertension, COPD, diabetes mellitus type 2, hyperlipidemia, obesity, history of pulmonary embolism on Eliquis, CAD, history of cortrak tube and syndrome status post low back pain and sciatica, chronic pain syndrome and peripheral vascular disease presented to hospital with left foot pain and weakness.  Patient already has right foot weakness and is on a brace for at least a week now but now both legs were hurting.  Patient did have a fall a week back when she slumped against the wall and slid down and started having severe foot pain on the left.  She uses walker at baseline but has been wheelchair-bound at this time.   In the ED, patient had mild tachycardia.  She did have mild leukocytosis at 11.7.  Chemistry showed creatinine at 1.4.  CT scan of the lumbar spine showed L1 inferior endplate fracture, CT head was nonacute.  Chest x-ray was negative.  She was given IV Dilaudid, neurosurgery was consulted and patient was admitted hospital for further evaluation and treatment.  Assessment and plan.  L1 inferior endplate fracture with back and foot pain.  Ambulatory dysfunction.  Has been more wheelchair-bound at this time.  Continue analgesics.  Physical therapy evaluation.  Neurosurgery  recommend putting the patient in TSL brace and pain management.  Neurosurgery to follow-up. . CKD (chronic kidney disease) stage 3b Continue to monitor BMP.   CAD (coronary artery disease) Continue Mevacor, patient is on aspirin statin beta-blocker and Plavix as outpatient..  Will restart metoprolol when blood pressure is okay.   Essential hypertension Pressure borderline low on presentation.  Continue to monitor.   Diabetes mellitus due to underlying condition with unspecified complications Continue sliding scale insulin Accu-Cheks diabetic diet.  Hold oral medications.   Mixed dyslipidemia Continue Mevacor     Morbid obesity  Body mass index is 35.2 kg/m.  Would benefit from weight loss as outpatient.   PAF (paroxysmal atrial fibrillation) (HCC) CHA2DS2-VASc score of 8.  Continue Eliquis.  Blood pressure was low so metoprolol has not been started.   Chronic obstructive lung disease ([place]) Appears compensated.  Continue bronchodilators.  Follow up with pulmonary as outpatient.  Not on oxygen at baseline.   History of DVT/pulmonary embolus (PE) Continue Eliquis.   Foraminal stenosis of lumbar region with chronic pain syndrome. Continue analgesia.  Continue gabapentin and Norco.   Esophageal abnormality Speech therapy consult.   OSA (obstructive sleep apnea) CPAP

## 2022-06-10 NOTE — Progress Notes (Signed)
Initial Nutrition Assessment  DOCUMENTATION CODES:   Not applicable  INTERVENTION:  Advance to carb modified after procedure Ensure Enlive po TID, each supplement provides 350 kcal and 20 grams of protein. MVI with minerals daily  NUTRITION DIAGNOSIS:   Inadequate oral intake related to poor appetite as evidenced by per patient/family report.  GOAL:   Patient will meet greater than or equal to 90% of their needs  MONITOR:   PO intake, Diet advancement, Labs  REASON FOR ASSESSMENT:   Consult Assessment of nutrition requirement/status  ASSESSMENT:   Pt with hx of PAF, HTN, HLD, COPD, DM type 2, CAD, PVD, and CKD3 presented to ED with left foot pain and weakness.  Pt resting in bed at the time of assessment. Husband at bedside. Pt had breakfast this AM but is now NPO. Pt states she has to go for a swallow study - unsure when this is being performed or by who. Discussed with RN who states pt is scheduled for an esophagram.   Pt reports that for several months she has not had much of an appetite. Reports that she has been drinking 1-2 Equate High [employer] shakes (160kcal and 30g of protein). Discussed with pt and husband it would be preferable to switch to one with higher kcal if PO intake is poor. Agreeable to ensure once diet is advanced. Does not like strawberry flavor, is fine with chocolate.  Nutritionally Relevant Medications: Scheduled Meds:  docusate sodium  100 mg Oral Daily   ferrous sulfate  325 mg Oral Q breakfast   insulin aspart  0-5 Units Subcutaneous QHS   insulin aspart  0-9 Units Subcutaneous TID WC   pantoprazole  40 mg Oral Daily   pravastatin  40 mg Oral q1800   Continuous Infusions:  sodium chloride 75 mL/hr at 06/09/22 2302   PRN Meds: bisacodyl, ondansetron, polyethylene glycol  Labs Reviewed: Creatinine 1.31 CBG ranges from 116-130 mg/dL over the last 24 hours  NUTRITION - FOCUSED PHYSICAL EXAM: Flowsheet Row Most Recent Value  Orbital  Region No depletion  Upper Arm Region No depletion  Thoracic and Lumbar Region No depletion  Buccal Region No depletion  Temple Region No depletion  Clavicle Bone Region No depletion  Clavicle and Acromion Bone Region No depletion  Scapular Bone Region No depletion  Dorsal Hand Moderate depletion  Patellar Region Mild depletion  Anterior Thigh Region Mild depletion  Posterior Calf Region Mild depletion  Edema (RD Assessment) None  Hair Reviewed  Eyes Reviewed  Mouth Reviewed  Skin Reviewed  Nails Reviewed    Diet Order:   Diet Order             Diet NPO time specified Except for: Other (See Comments)  Diet effective now                   EDUCATION NEEDS:  Education needs have been addressed  Skin:  Skin Assessment: Reviewed RN Assessment  Last BM:  3/4  Height:  Ht Readings from Last 1 Encounters:  06/09/22 '4\' 8"'$  (1.422 m)    Weight:  Wt Readings from Last 1 Encounters:  06/09/22 71.2 kg    Ideal Body Weight:  40.9 kg  BMI:  Body mass index is 35.2 kg/m.  Estimated Nutritional Needs:  Kcal:  1300-1500 kcal/d Protein:  60-80 g/d Fluid:  >/=1.5L/d    Ranell Patrick, RD, LDN Clinical Dietitian RD pager # available in [facility]  After hours/weekend pager # available in [facility]

## 2022-06-10 NOTE — Progress Notes (Signed)
PT Cancellation Note  Patient Details Name: Lisa Maxwell MRN: [id number] DOB: 07-16-1945   Cancelled Treatment:    Reason Eval/Treat Not Completed: Patient at procedure or test/unavailable  Wyona Almas, PT, DPT Acute Rehabilitation Services Office [place] 06/10/2022, 2:00 PM

## 2022-06-10 NOTE — Care Management Obs Status (Signed)
[place] NOTIFICATION   Patient Details  Name: Lisa Maxwell MRN: [id number] Date of Birth: 03-May-1945   Medicare Observation Status Notification Given:  Yes    Pollie Friar, RN 06/10/2022, 4:10 PM

## 2022-06-10 NOTE — Progress Notes (Signed)
Patient arrived in the unit at 2350pm from [facility], A&O x4, denied of acute pain, situated in a bed comfortably, initiated telemetry monitor, all safety measures are in placed, and will continue to monitor closely.

## 2022-06-10 NOTE — Evaluation (Signed)
Occupational Therapy Evaluation Patient Details Name: AMYRIAH GARTRELL MRN: [id number] DOB: 05/24/1945 Today's Date: 06/10/2022   History of Present Illness 77 y.o. female with medical history significant of   A-fib on Eliquis, HTN, COPD, diabetes, HLD, obesity, history of pulmonary embolism on Eliquis, CAD, history of cauda equina syndrome bilateral low back pain with sciatica, CKD, chronic pain syndrome PVD.  Admitted for L96facture   Clinical Impression   Pt admitted as above, presenting with deficits as listed below (please refer to OT problem list for details). Pt seen for limited bedside eval for OT. Pt lives with husband in 2 story home, able to live on main level (pt currently sleeping on couch but has stair lift at home), using 3:1 for toileting. Pt reports that until recently, she was using RW or rollator for amb but has been w/c (manual) as of late for functional mobility. She states that her husband assists PRN for LB ADL's but she is Mod I for basic ADL's. Pt was Mod A +1-+2 for log rolling in bed this morning to assist with repositioning in bed. Pillows were placed at her back and between her knees as she requested to lay on her right side. Per chart review, pt is to wear TLSO that is not yet in room therefore OOB assessment was deferred at this time. At baseline, pt also wears a brace on her right LE for drop foot, her husband was called and asked to bring this in and her shoes so OOB assessment can be performed. Pt should benefit from acute OT to assist in maximizing independence with ADL's and functional transfers related to ADL's.     Recommendations for follow up therapy are one component of a multi-disciplinary discharge planning process, led by the attending physician.  Recommendations may be updated based on patient status, additional functional criteria and insurance authorization.   Follow Up Recommendations  Skilled nursing-short term rehab (<3 hours/day) (May have HHOT is pt  family can provide 24/7 assist)     Assistance Recommended at Discharge Frequent or constant Supervision/Assistance  Patient can return home with the following Two people to help with walking and/or transfers;Two people to help with bathing/dressing/bathroom;Assist for transportation;Help with stairs or ramp for entrance;Assistance with cooking/housework    Functional Status Assessment  Patient has had a recent decline in their functional status and demonstrates the ability to make significant improvements in function in a reasonable and predictable amount of time.  Equipment Recommendations  Other (comment) (Cont to assess)    Recommendations for Other Services       Precautions / Restrictions Precautions Precautions: Fall;Other (comment) Precaution Comments: TLSO brace - may remove to shower      Mobility Bed Mobility Overal bed mobility: Needs Assistance Bed Mobility: Rolling Rolling: Mod assist (+1-+2 using rails)         General bed mobility comments: Assistance for maintaining back precautions/log rolling using rails.    Transfers Overall transfer level:  (Deferred at this time - waiting on TLSO and RLE brace for foot drop)      Balance Overall balance assessment:  (OOB activity deferred at thist ime - waiting on TLSO and R LE brace)     ADL either performed or assessed with clinical judgement   ADL Overall ADL's : Needs assistance/impaired Eating/Feeding: Modified independent;Bed level   Grooming: Set up;Bed level   Upper Body Bathing: Minimal assistance;Bed level   Lower Body Bathing: +2 for physical assistance;+2 for safety/equipment;Bed level;Moderate assistance Lower Body Bathing  Details (indicate cue type and reason): +1 for rolling in bed onto right side using rails to reposition for comfort ni bed. Pillows used for support (waiting on TLSO brace and R LE brace from home - spouse to bring). Upper Body Dressing : Minimal assistance;Bed level   Lower  Body Dressing: Maximal assistance;+2 for physical assistance;+2 for safety/equipment;Bed level     Toilet Transfer Details (indicate cue type and reason): Using Pure [employer] - waiting on TLSO brace and   Functional mobility during ADLs:  (OOB NT, waiting onm R LE brace (spouse to bring in with shoes) and TLSO brace) General ADL Comments: Pt seen for limited bedside eval for OT. Pt lives with husband in 2 story home, able to live on main level (pt currently sleeping on couch but has stair lift at home), using 3:1 for toileting. Pt reports that until recently, she was using RW or rollator for amb but has been w/c (manual) as of late for functional mobility. She states that her husband assists PRN for LB ADL's but she is Mod I for basic ADL's. Pt was Mod A for log rolling in bed this morning to assist with repositioning in bed. Pillows were placed at her back and between her knees as she requested to lay on her right side. Per chart review, pt is to wear TLSO that is not yet in room therefore OOB assessment was deferred at this time. At baseline, pt also wears a brace on her right LE for drop foot, her husband was called and asked to bring this in and her shoes so OOB assessment can be performed. Pt should benefit from acute OT to assist in maximizing independence with ADL's and functional transfers related to ADL's.     Vision Baseline Vision/History: 1 Wears glasses Patient Visual Report: No change from baseline       Perception     Praxis      Pertinent Vitals/Pain Pain Assessment Pain Assessment: 0-10 Pain Score: 8  (with movement) Pain Location: Low back pain radiating down to left knee Pain Descriptors / Indicators: Aching, Discomfort, Numbness Pain Intervention(s): Limited activity within patient's tolerance, Repositioned, Monitored during session     Hand Dominance Right   Extremity/Trunk Assessment Upper Extremity Assessment Upper Extremity Assessment: Overall WFL for tasks  assessed;Generalized weakness (Pt reports tremors at times, although this was not observed)   Lower Extremity Assessment Lower Extremity Assessment: Defer to PT evaluation       Communication Communication Communication: HOH   Cognition Arousal/Alertness: Awake/alert Behavior During Therapy: WFL for tasks assessed/performed Overall Cognitive Status: Within Functional Limits for tasks assessed         General Comments  Pt HR increased into 120's when speaking wtih OT at bedside - pt denied symptoms and HR retruned to 70's-80"s            Home Living Family/patient expects to be discharged to:: Private residence Living Arrangements: Spouse/significant other Available Help at Discharge: Family;Available 24 hours/day Type of Home: House Home Access: Ramped entrance     Home Layout: Two level;Bed/bath upstairs;Able to live on main level with bedroom/bathroom (Sleeps on couch) Alternate Level Stairs-Number of Steps: 4 steps - Has stair lift Alternate Level Stairs-Rails: Right Bathroom Shower/Tub: Occupational psychologist:  (Uses 3:1 near couch)     Home Equipment: Conservation officer, nature (2 wheels);Rollator (4 wheels);BSC/3in1;Shower seat - built in;Grab bars - tub/shower;Wheelchair - manual;Shower seat   Additional Comments: Has had walk in handicapped shower installed  Prior Functioning/Environment Prior Level of Function : Needs assist   Mobility Comments: Was able to use rollator until recently, has been using w/c lately ADLs Comments: Pt reports that husband assists PRN with ADL's, getting into shower, does her own bathing and dressing & ocassionally assists with getting her incontinence panties on or assist with shoes/brace.        OT Problem List: Impaired balance (sitting and/or standing);Decreased knowledge of precautions;Pain;Cardiopulmonary status limiting activity;Obesity;Decreased activity tolerance;Decreased knowledge of use of DME or AE      OT  Treatment/Interventions: Self-care/ADL training;DME and/or AE instruction;Therapeutic activities;Energy conservation;Patient/family education    OT Goals(Current goals can be found in the care plan section) Acute Rehab OT Goals Patient Stated Goal: Decreased pain to LLE and back OT Goal Formulation: With patient Time For Goal Achievement: 06/24/22 Potential to Achieve Goals: Good  OT Frequency: Min 2X/week       AM-PAC OT "6 Clicks" Daily Activity     Outcome Measure Help from another person eating meals?: None Help from another person taking care of personal grooming?: A Little Help from another person toileting, which includes using toliet, bedpan, or urinal?: A Lot Help from another person bathing (including washing, rinsing, drying)?: A Lot Help from another person to put on and taking off regular upper body clothing?: A Little Help from another person to put on and taking off regular lower body clothing?: Total 6 Click Score: 15   End of Session Equipment Utilized During Treatment:  (Waiting on TLSO and R LE brace for drop foot) Nurse Communication: Other (comment) (waiting on TLSO and R LE brace)  Activity Tolerance: Patient tolerated treatment well Patient left: in bed;with call bell/phone within reach;with bed alarm set  OT Visit Diagnosis: Other abnormalities of gait and mobility (R26.89);History of falling (Z91.81);Pain Pain - Right/Left:  (low back pain) Pain - part of body:  (Low back radiating down LLE)                Time: [id number] OT Time Calculation (min): 35 min Charges:  OT General Charges $OT Visit: 1 Visit OT Evaluation $OT Eval Low Complexity: 1 Low OT Treatments $Therapeutic Activity: 8-22 mins  Nalah Macioce Beth Dixon, OTR/L 06/10/2022, 9:07 AM

## 2022-06-10 NOTE — Progress Notes (Addendum)
PROGRESS NOTE    [name]  [id number] DOB: 06/08/1945 DOA: 06/09/2022 PCP: Nicoletta Dress, MD    Brief Narrative:  Lisa Maxwell is a 77 y.o. female with past medical history of atrial fibrillation on Eliquis, hypertension, COPD, diabetes mellitus type 2, hyperlipidemia, obesity, history of pulmonary embolism on Eliquis, CAD, history of cortrak tube and syndrome status post low back pain and sciatica, chronic pain syndrome and peripheral vascular disease presented to hospital with left foot pain and weakness.  Patient already has right foot weakness and is on a brace for at least a week now but now both legs were hurting.  Patient did have a fall a week back when she slumped against the wall and slid down and started having severe foot pain on the left.  She uses walker at baseline but has been wheelchair-bound at this time.   In the ED, patient had mild tachycardia.  She did have mild leukocytosis at 11.7.  Chemistry showed creatinine at 1.4.  CT scan of the lumbar spine showed L1 inferior endplate fracture, CT head was nonacute.  Chest x-ray was negative.  She was given IV Dilaudid, neurosurgery was consulted and patient was admitted hospital for further evaluation and treatment.  Assessment and plan.  L1 inferior endplate fracture with back and foot pain.  Ambulatory dysfunction.  Has been more wheelchair-bound at this time.  Continue analgesics.  Physical therapy evaluation.  Neurosurgery  recommend putting the patient in TSL0 brace and pain management.  Neurosurgery to follow-up. . CKD (chronic kidney disease) stage 3b Continue to monitor BMP.   CAD (coronary artery disease) Continue Mevacor, patient is on aspirin statin beta-blocker and Plavix as outpatient..  Will restart metoprolol when blood pressure is okay.   Essential hypertension Pressure borderline low on presentation.  Continue to monitor.   Diabetes mellitus due to underlying condition with unspecified  complications Continue sliding scale insulin Accu-Cheks diabetic diet.  Hold oral medications.   Mixed dyslipidemia Continue Mevacor    Morbid obesity  Body mass index is 35.2 kg/m.  Would benefit from weight loss as outpatient.   PAF (paroxysmal atrial fibrillation) (HCC) CHA2DS2-VASc score of 8.  Continue Eliquis.  Blood pressure was low so metoprolol has not been started.   Chronic obstructive lung disease ([place]) Appears compensated.  Continue bronchodilators.  Follow up with pulmonary as outpatient.  Not on oxygen at baseline.   History of DVT/pulmonary embolus (PE) Continue Eliquis.   Foraminal stenosis of lumbar region with chronic pain syndrome. Continue analgesia.  Continue gabapentin and Norco.   Esophageal abnormality Barium swallow examination.  OSA (obstructive sleep apnea) CPAP    DVT prophylaxis:  apixaban (ELIQUIS) tablet 5 mg   Code Status:     Code Status: Full Code  Disposition: Uncertain at this time we will get PT OT evaluation. Status is: Observation The patient will require care spanning > 2 midnights and should be moved to inpatient because: Intractable pain, ambulatory dysfunction, neurosurgical evaluation,   Family Communication: None at bedside  Consultants:  Neurosurgery  Procedures:  T SLO brace application  Antimicrobials:  None  Anti-infectives (From admission, onward)    None        Subjective: Today, patient was seen and examined at bedside.  Still complains of back pain with radiation of the pain on the left lower extremity up to the knee.  Objective: Vitals:   06/10/22 0833 06/10/22 0848 06/10/22 0853 06/10/22 1114  BP:  (!) 125/93  Pulse: (!) 102 (!) 125 66 72  Resp: '19 20 19 16  '$ Temp:      TempSrc:      SpO2: 95% 94% 96% 95%  Weight:      Height:       No intake or output data in the 24 hours ending 06/10/22 1128 Filed Weights   06/09/22 1041  Weight: 71.2 kg    Physical Examination: Body mass index is  35.2 kg/m.  General: Alert awake, not in obvious distress, elderly female, obese, chronically ill, HENT:   No scleral pallor or icterus noted. Oral mucosa is moist.  Patient. Chest:  Clear breath sounds.  Diminished breath sounds bilaterally. No crackles or wheezes.  Tenderness over the lumbar spine. CVS: S1 &S2 heard. No murmur.  Regular rate and rhythm. Abdomen: Soft, nontender, nondistended.  Bowel sounds are heard.   Extremities: No cyanosis, clubbing or bilateral lower extremity edema.  Peripheral pulses are palpable. Psych: Alert, awake and oriented, normal mood CNS:  No cranial nerve deficits.  Decreased strength of bilateral lower extremity more on the left than the right. Skin: Warm and dry.  No rashes noted.  Data Reviewed:   CBC: Recent Labs  Lab 06/09/22 1102 06/10/22 0732  WBC 11.7* 7.1  NEUTROABS 9.9*  --   HGB 15.1* 14.0  HCT 47.3* 43.2  MCV 96.7 95.2  PLT 207 0000000    Basic Metabolic Panel: Recent Labs  Lab 06/09/22 1102 06/10/22 0732  NA 136 138  K 4.6 4.6  CL 103 107  CO2 24 19*  GLUCOSE 120* 120*  BUN 22 16  CREATININE 1.40* 1.31*  CALCIUM 10.8* 10.0  MG  --  1.8  PHOS  --  2.6    Liver Function Tests: Recent Labs  Lab 06/09/22 1102 06/10/22 0732  AST 35 34  ALT 25 24  ALKPHOS 65 60  BILITOT 0.6 0.6  PROT 6.7 6.1*  ALBUMIN 3.8 3.3*     Radiology Studies: MR LUMBAR SPINE WO CONTRAST  Result Date: 06/09/2022 CLINICAL DATA:  Right foot drop for a week and a half. EXAM: MRI LUMBAR SPINE WITHOUT CONTRAST TECHNIQUE: Multiplanar, multisequence MR imaging of the lumbar spine was performed. No intravenous contrast was administered. COMPARISON:  Lumbar CT from earlier the same day FINDINGS: Segmentation:  Standard. Alignment:  Dextroscoliosis. Vertebrae: L1 inferior endplate fracture with [id number] height loss measured by CT. Fracture is potentially subacute by CT. Mild concavity of the L2 superior endplate as well. Discogenic endplate edema at the L1 and  L2 bodies. No aggressive bone lesion. Conus medullaris and cauda equina: Conus extends to the T12-L1 level. The conus appears T2 hyperintense at the level of the gray matter although only seen at levels where there is artifact from spinal cord stimulator. Paraspinal and other soft tissues: Postoperative scarring and atrophy of intrinsic back muscles. Sigmoid diverticulosis. Disc levels: T10-11: Disc collapse with endplate and facet spurring causing biforaminal impingement and at least moderate spinal stenosis. T11-12: Disc collapse and endplate degeneration with circumferential bulging and facet spurring. Biforaminal impingement. Signs of high-grade spinal stenosis although likely exacerbated by artifact from the hardware. T12- L1: Degenerative facet spurring asymmetric to the right. The canal and foramina are patent L1-L2: Disc narrowing and bulging with endplate spurring. Moderate spinal stenosis. Left more than right foraminal impingement. L2-L3: Fusion with no visible impingement L3-L4: Degenerative facet spurring which is bulky. Circumferential disc bulging with left foraminal annular fissure. Moderate spinal stenosis. Moderate right foraminal narrowing L4-L5: Degenerative facet  spurring with bulky hypertrophy asymmetric to the right. Mild anterolisthesis and circumferential disc bulging. Moderate spinal stenosis. Right foraminal impingement. L5-S1:Bulky degenerative facet spurring. IMPRESSION: 1. Advanced and generalized lumbar spine degeneration with scoliosis and L4-5 anterolisthesis. 2. L1 inferior endplate fracture which is subacute to chronic. L1 and L2 vertebral body edema which could be degenerative or traumatic. 3. T11-12 severe degeneration with spinal stenosis and apparent cord impingement that is likely accentuated by susceptibility artifact from spinal cord stimulator leads. On axial T2 weighted imaging there is question of lower cord edema, correlate for myelopathy. Severe biforaminal impingement  at this level. 4. Foraminal stenosis on the right at L1-2, L3-4, and L4-5. 5. Left foraminal stenosis primarily at L1-2. 6. Moderate spinal stenosis at L3-4 and L4-5. Electronically Signed   By: Jorje Guild M.D.   On: 06/09/2022 19:58   CT Thoracic Spine Wo Contrast  Result Date: 06/09/2022 CLINICAL DATA:  Back trauma EXAM: CT THORACIC SPINE WITHOUT CONTRAST TECHNIQUE: Multidetector CT images of the thoracic were obtained using the standard protocol without intravenous contrast. RADIATION DOSE REDUCTION: This exam was performed according to the departmental dose-optimization program which includes automated exposure control, adjustment of the mA and/or kV according to patient size and/or use of iterative reconstruction technique. COMPARISON:  None Available. FINDINGS: Alignment: Normal. Vertebrae: No acute fracture or focal pathologic process. The see separately dictated lumbar spine CT for description of osseous changes of the L1 vertebral body level. Paraspinal and other soft tissues: Small amount of secretions in the esophagus. Moderate centrilobular emphysema. Ground-glass opacity in the right lobe, nonspecific. Gallbladder is partially imaged and appears distended. Right basilar atelectasis. Is mild nonspecific soft tissue stranding of the GE junction (series 19, image 105). Large left-sided thyroid nodule measuring up to 1.8 cm. Disc levels: Spinal nerve stimulator leads terminate at the T6-T9 levels. IMPRESSION: 1. No acute fracture or traumatic malalignment of the thoracic spine. 2. Small amount of secretions/debris in the esophagus with mild nonspecific soft tissue stranding at the GE junction. Correlate for symptoms of dysphagia. Ground-glass opacity in the right upper lobe, nonspecific. 3. Spinal nerve stimulator leads terminate at the T6-T9 levels. 4. 1.8 cm left thyroid nodule. Recommend further evaluation with a dedicated thyroid ultrasound. 5. Please refer to the separately dictated lumbar  spine CT for description of osseous changes of the L1 vertebral body level. Emphysema (ICD10-J43.9). Electronically Signed   By: Marin Roberts M.D.   On: 06/09/2022 13:01   CT Lumbar Spine Wo Contrast  Result Date: 06/09/2022 CLINICAL DATA:  Right foot drop. Worsening leg weakness over the last 2 weeks. Back trauma. EXAM: CT LUMBAR SPINE WITHOUT CONTRAST TECHNIQUE: Multidetector CT imaging of the lumbar spine was performed without intravenous contrast administration. Multiplanar CT image reconstructions were also generated. RADIATION DOSE REDUCTION: This exam was performed according to the departmental dose-optimization program which includes automated exposure control, adjustment of the mA and/or kV according to patient size and/or use of iterative reconstruction technique. COMPARISON:  Radiography 09/22/2021.  MRI 08/15/2021 FINDINGS: Segmentation: 5 lumbar type vertebral bodies as numbered previously. Alignment: Lower thoracic and upper lumbar curvature convex to the right. Vertebrae: Chronic degenerative endplate changes at [id number]. Compression fracture/insufficiency fracture of the inferior endplate of L1. Probable solid union at the L2-3 fusion level. Paraspinal and other soft tissues: Negative. Neurostimulator in place entering the T12-L1 interlaminar space. Disc levels: T11-12: Chronic disc degeneration with loss of disc height. Chronic endplate sclerosis and cystic change. Circumferential protrusion of the disc. Potential for significant stenosis  at this level. T12-L1: Disc bulge. No compressive stenosis. Neurostimulator enters posteriorly at this level. L1-2: Progressive disc degeneration with vacuum phenomenon. Inferior endplate compression fracture or insufficiency fracture at L1 with loss of height anteriorly of 30%. Circumferential bulging of the disc. Multifactorial stenosis at this level that could be significant. Pedicle screw on the right comes very close to or breaches the superior endplate.  L2-3: Previous discectomy and fusion procedure. Probable solid union. Sufficient decompression of the canal. Some potential for ongoing foraminal stenosis on the right. L3-4: Bulging of the disc. Facet and ligamentous hypertrophy. Moderate multifactorial stenosis. Bilateral foraminal stenosis. L4-5: Chronic facet arthropathy with anterolisthesis of 3 mm. Bulging of the disc. Moderate multifactorial stenosis. Bilateral foraminal stenosis. L5-S1: Bulging of the disc. Bilateral facet degeneration and hypertrophy. Stenosis of the subarticular lateral recesses. Sufficient patency of the central canal and neural foramina. Bilateral sacroiliac osteoarthritis is present IMPRESSION: 1. T11-12: Chronic disc degeneration with loss of disc height. Chronic endplate sclerosis and cystic change. Circumferential protrusion of the disc. Potential for significant stenosis at this level. 2. L1-2: Progressive disc degeneration with vacuum phenomenon. Inferior endplate compression fracture or insufficiency fracture at L1 with loss of height anteriorly of 30%. Circumferential bulging of the disc. Multifactorial stenosis at this level that could be significant. Pedicle screw on the right comes very close to or breaches the superior endplate. 3. L2-3: Previous discectomy and fusion procedure. Probable solid union. Sufficient decompression of the canal. Some potential for ongoing foraminal stenosis on the right. 4. L3-4: Moderate multifactorial stenosis. Bilateral foraminal stenosis. 5. L4-5: Chronic facet arthropathy with anterolisthesis of 3 mm. Bulging of the disc. Moderate multifactorial stenosis. Bilateral foraminal stenosis. 6. L5-S1: Bilateral facet degeneration and hypertrophy. Stenosis of the subarticular lateral recesses. Sufficient patency of the central canal and neural foramina. Electronically Signed   By: Nelson Chimes M.D.   On: 06/09/2022 12:58   CT Head Wo Contrast  Result Date: 06/09/2022 CLINICAL DATA:  Head trauma,  minor (Age >= 65y); Neck trauma (Age >= 65y) EXAM: CT HEAD WITHOUT CONTRAST CT CERVICAL SPINE WITHOUT CONTRAST TECHNIQUE: Multidetector CT imaging of the head and cervical spine was performed following the standard protocol without intravenous contrast. Multiplanar CT image reconstructions of the cervical spine were also generated. RADIATION DOSE REDUCTION: This exam was performed according to the departmental dose-optimization program which includes automated exposure control, adjustment of the mA and/or kV according to patient size and/or use of iterative reconstruction technique. COMPARISON:  11/21/2021 FINDINGS: CT HEAD FINDINGS Brain: No evidence of acute infarction, hemorrhage, hydrocephalus, extra-axial collection or mass lesion/mass effect. Chronic small left occipital lobe infarct. Patchy low-density changes within the periventricular and subcortical white matter most compatible with chronic microvascular ischemic change. Mild diffuse cerebral volume loss. Vascular: Atherosclerotic calcifications involving the large vessels of the skull base. No unexpected hyperdense vessel. Skull: Normal. Negative for fracture or focal lesion. Sinuses/Orbits: No acute finding. 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 Other: Negative for scalp hematoma. CT CERVICAL SPINE FINDINGS Alignment: Facet joints are aligned without dislocation or traumatic listhesis. Dens and lateral masses are aligned. Grade 1 anterolisthesis of C3 on C4, C4 on C5, and C5 on C6 mediated by degenerative facet arthropathy. Skull base and vertebrae: No acute fracture. No suspicious lytic or sclerotic bone lesion. There is a 1.5 cm well-circumscribed bony protuberance arising posteriorly from the left C3 lamina, likely reflecting an osteochondroma. Soft tissues and spinal canal: No prevertebral fluid or swelling.  No visible canal hematoma. Disc levels: Moderate-advanced multilevel cervical  spondylosis with both degenerative disc disease and facet arthropathy. Upper chest: Mild emphysematous changes at the lung apices. Other: Bilateral carotid atherosclerosis. IMPRESSION: 1. No acute intracranial abnormality. 2. No acute fracture or traumatic listhesis of the cervical spine. Electronically Signed   By: Davina Poke D.O.   On: 06/09/2022 12:56   CT Cervical Spine Wo Contrast  Result Date: 06/09/2022 CLINICAL DATA:  Head trauma, minor (Age >= 65y); Neck trauma (Age >= 65y) EXAM: CT HEAD WITHOUT CONTRAST CT CERVICAL SPINE WITHOUT CONTRAST TECHNIQUE: Multidetector CT imaging of the head and cervical spine was performed following the standard protocol without intravenous contrast. Multiplanar CT image reconstructions of the cervical spine were also generated. RADIATION DOSE REDUCTION: This exam was performed according to the departmental dose-optimization program which includes automated exposure control, adjustment of the mA and/or kV according to patient size and/or use of iterative reconstruction technique. COMPARISON:  11/21/2021 FINDINGS: CT HEAD FINDINGS Brain: No evidence of acute infarction, hemorrhage, hydrocephalus, extra-axial collection or mass lesion/mass effect. Chronic small left occipital lobe infarct. Patchy low-density changes within the periventricular and subcortical white matter most compatible with chronic microvascular ischemic change. Mild diffuse cerebral volume loss. Vascular: Atherosclerotic calcifications involving the large vessels of the skull base. No unexpected hyperdense vessel. Skull: Normal. Negative for fracture or focal lesion. Sinuses/Orbits: No acute finding. 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 Other: Negative for scalp hematoma. CT CERVICAL SPINE FINDINGS Alignment: Facet joints are aligned without dislocation or traumatic listhesis. Dens and lateral masses are aligned. Grade 1 anterolisthesis of C3 on  C4, C4 on C5, and C5 on C6 mediated by degenerative facet arthropathy. Skull base and vertebrae: No acute fracture. No suspicious lytic or sclerotic bone lesion. There is a 1.5 cm well-circumscribed bony protuberance arising posteriorly from the left C3 lamina, likely reflecting an osteochondroma. Soft tissues and spinal canal: No prevertebral fluid or swelling. No visible canal hematoma. Disc levels: Moderate-advanced multilevel cervical spondylosis with both degenerative disc disease and facet arthropathy. Upper chest: Mild emphysematous changes at the lung apices. Other: Bilateral carotid atherosclerosis. IMPRESSION: 1. No acute intracranial abnormality. 2. No acute fracture or traumatic listhesis of the cervical spine. Electronically Signed   By: Davina Poke D.O.   On: 06/09/2022 12:56   DG Foot Complete Left  Result Date: 06/09/2022 CLINICAL DATA:  Trauma, fall, pain EXAM: LEFT FOOT - COMPLETE 3+ VIEW COMPARISON:  None Available. FINDINGS: No displaced fracture or dislocation is seen. Osteopenia is seen in bony structures. Small plantar spur is seen in calcaneus. Small bony spurs are noted in the dorsal aspect of talonavicular joint. IMPRESSION: No recent fracture or dislocation is seen in left foot. Electronically Signed   By: Elmer Picker M.D.   On: 06/09/2022 12:15   DG Ankle 2 Views Left  Result Date: 06/09/2022 CLINICAL DATA:  Trauma, fall EXAM: LEFT ANKLE - 2 VIEW COMPARISON:  None Available. FINDINGS: No fracture or dislocation is seen. Osteopenia is seen in bony structures. Small plantar spur is seen in calcaneus. Small bony spurs are noted in the dorsal aspect of talonavicular joint. There is soft tissue swelling around the ankle. IMPRESSION: No fracture or dislocation is seen in left ankle. Electronically Signed   By: Elmer Picker M.D.   On: 06/09/2022 12:12   DG Chest 1 View  Result Date: 06/09/2022 CLINICAL DATA:  Fall EXAM: CHEST  1 VIEW COMPARISON:  11/21/21  CXR  FINDINGS: Spinal nerve stimulator leads in place. Partially visualized lumbar spinal fusion hardware in place. No pleural effusion. No pneumothorax. Unchanged cardiac and mediastinal contours when accounting differences in positioning. No focal airspace opacity. No radiographically apparent displaced rib fractures. IMPRESSION: No radiographic evidence of thoracic injury. Electronically Signed   By: Marin Roberts M.D.   On: 06/09/2022 12:11      LOS: 0 days     Flora Lipps, MD [facility] Available via Epic secure chat 7am-7pm After these hours, please refer to coverage provider listed on [email or web address] 06/10/2022, 11:28 AM

## 2022-06-10 NOTE — Progress Notes (Signed)
Orthopedic Tech Progress Note Patient Details:  CORNELLA RUFFER Jun 18, 1945 [id number]  Ortho Devices Type of Ortho Device: Thoracolumbar corset (TLSO) Ortho Device/Splint Interventions: Ordered, Adjustment   Post Interventions Patient Tolerated: Well Instructions Provided: Care of device, Adjustment of device TLSO fitted and left at bedside. Vernona Rieger 06/10/2022, 9:19 AM

## 2022-06-10 NOTE — Plan of Care (Signed)

## 2022-06-11 DIAGNOSIS — M48061 Spinal stenosis, lumbar region without neurogenic claudication: Secondary | ICD-10-CM | POA: Diagnosis present

## 2022-06-11 DIAGNOSIS — Z7901 Long term (current) use of anticoagulants: Secondary | ICD-10-CM | POA: Diagnosis not present

## 2022-06-11 DIAGNOSIS — K449 Diaphragmatic hernia without obstruction or gangrene: Secondary | ICD-10-CM | POA: Diagnosis not present

## 2022-06-11 DIAGNOSIS — G894 Chronic pain syndrome: Secondary | ICD-10-CM | POA: Diagnosis present

## 2022-06-11 DIAGNOSIS — K3189 Other diseases of stomach and duodenum: Secondary | ICD-10-CM | POA: Diagnosis not present

## 2022-06-11 DIAGNOSIS — R933 Abnormal findings on diagnostic imaging of other parts of digestive tract: Secondary | ICD-10-CM | POA: Diagnosis not present

## 2022-06-11 DIAGNOSIS — I2510 Atherosclerotic heart disease of native coronary artery without angina pectoris: Secondary | ICD-10-CM | POA: Diagnosis present

## 2022-06-11 DIAGNOSIS — E669 Obesity, unspecified: Secondary | ICD-10-CM | POA: Diagnosis present

## 2022-06-11 DIAGNOSIS — I482 Chronic atrial fibrillation, unspecified: Secondary | ICD-10-CM | POA: Diagnosis present

## 2022-06-11 DIAGNOSIS — R1314 Dysphagia, pharyngoesophageal phase: Secondary | ICD-10-CM | POA: Diagnosis present

## 2022-06-11 DIAGNOSIS — E088 Diabetes mellitus due to underlying condition with unspecified complications: Secondary | ICD-10-CM | POA: Diagnosis not present

## 2022-06-11 DIAGNOSIS — W1830XA Fall on same level, unspecified, initial encounter: Secondary | ICD-10-CM | POA: Diagnosis present

## 2022-06-11 DIAGNOSIS — D72829 Elevated white blood cell count, unspecified: Secondary | ICD-10-CM | POA: Diagnosis present

## 2022-06-11 DIAGNOSIS — R1319 Other dysphagia: Secondary | ICD-10-CM | POA: Diagnosis not present

## 2022-06-11 DIAGNOSIS — S32019A Unspecified fracture of first lumbar vertebra, initial encounter for closed fracture: Secondary | ICD-10-CM | POA: Diagnosis not present

## 2022-06-11 DIAGNOSIS — Z9682 Presence of neurostimulator: Secondary | ICD-10-CM | POA: Diagnosis not present

## 2022-06-11 DIAGNOSIS — I129 Hypertensive chronic kidney disease with stage 1 through stage 4 chronic kidney disease, or unspecified chronic kidney disease: Secondary | ICD-10-CM | POA: Diagnosis present

## 2022-06-11 DIAGNOSIS — N1832 Chronic kidney disease, stage 3b: Secondary | ICD-10-CM | POA: Diagnosis present

## 2022-06-11 DIAGNOSIS — M79672 Pain in left foot: Secondary | ICD-10-CM | POA: Diagnosis present

## 2022-06-11 DIAGNOSIS — M5136 Other intervertebral disc degeneration, lumbar region: Secondary | ICD-10-CM | POA: Diagnosis present

## 2022-06-11 DIAGNOSIS — E1151 Type 2 diabetes mellitus with diabetic peripheral angiopathy without gangrene: Secondary | ICD-10-CM | POA: Diagnosis present

## 2022-06-11 DIAGNOSIS — R1310 Dysphagia, unspecified: Secondary | ICD-10-CM | POA: Diagnosis not present

## 2022-06-11 DIAGNOSIS — E782 Mixed hyperlipidemia: Secondary | ICD-10-CM | POA: Diagnosis present

## 2022-06-11 DIAGNOSIS — M21371 Foot drop, right foot: Secondary | ICD-10-CM | POA: Diagnosis present

## 2022-06-11 DIAGNOSIS — Z86711 Personal history of pulmonary embolism: Secondary | ICD-10-CM | POA: Diagnosis not present

## 2022-06-11 DIAGNOSIS — R627 Adult failure to thrive: Secondary | ICD-10-CM | POA: Diagnosis present

## 2022-06-11 DIAGNOSIS — J449 Chronic obstructive pulmonary disease, unspecified: Secondary | ICD-10-CM | POA: Diagnosis not present

## 2022-06-11 DIAGNOSIS — M4856XA Collapsed vertebra, not elsewhere classified, lumbar region, initial encounter for fracture: Secondary | ICD-10-CM | POA: Diagnosis present

## 2022-06-11 DIAGNOSIS — E1122 Type 2 diabetes mellitus with diabetic chronic kidney disease: Secondary | ICD-10-CM | POA: Diagnosis present

## 2022-06-11 DIAGNOSIS — R630 Anorexia: Secondary | ICD-10-CM | POA: Diagnosis present

## 2022-06-11 DIAGNOSIS — K222 Esophageal obstruction: Secondary | ICD-10-CM | POA: Diagnosis present

## 2022-06-11 DIAGNOSIS — F05 Delirium due to known physiological condition: Secondary | ICD-10-CM | POA: Diagnosis not present

## 2022-06-11 DIAGNOSIS — I480 Paroxysmal atrial fibrillation: Secondary | ICD-10-CM | POA: Diagnosis present

## 2022-06-11 DIAGNOSIS — K2289 Other specified disease of esophagus: Secondary | ICD-10-CM | POA: Diagnosis not present

## 2022-06-11 DIAGNOSIS — J439 Emphysema, unspecified: Secondary | ICD-10-CM | POA: Diagnosis present

## 2022-06-11 DIAGNOSIS — K2270 Barrett's esophagus without dysplasia: Secondary | ICD-10-CM | POA: Diagnosis not present

## 2022-06-11 LAB — CBC
HCT: 42.2 % (ref 36.0–46.0)
Hemoglobin: 13.5 g/dL (ref 12.0–15.0)
MCH: 30.5 pg (ref 26.0–34.0)
MCHC: 32 g/dL (ref 30.0–36.0)
MCV: 95.5 fL (ref 80.0–100.0)
Platelets: 187 10*3/uL (ref 150–400)
RBC: 4.42 MIL/uL (ref 3.87–5.11)
RDW: 13.5 % (ref 11.5–15.5)
WBC: 6.2 10*3/uL (ref 4.0–10.5)
nRBC: 0 % (ref 0.0–0.2)

## 2022-06-11 LAB — GLUCOSE, CAPILLARY
Glucose-Capillary: 108 mg/dL — ABNORMAL HIGH (ref 70–99)
Glucose-Capillary: 199 mg/dL — ABNORMAL HIGH (ref 70–99)
Glucose-Capillary: 129 mg/dL — ABNORMAL HIGH (ref 70–99)
Glucose-Capillary: 121 mg/dL — ABNORMAL HIGH (ref 70–99)

## 2022-06-11 LAB — BASIC METABOLIC PANEL
Anion gap: 8 (ref 5–15)
BUN: 14 mg/dL (ref 8–23)
CO2: 22 mmol/L (ref 22–32)
Calcium: 10.1 mg/dL (ref 8.9–10.3)
Chloride: 107 mmol/L (ref 98–111)
Creatinine, Ser: 1.12 mg/dL — ABNORMAL HIGH (ref 0.44–1.00)
GFR, Estimated: 51 mL/min — ABNORMAL LOW (ref >60)
Glucose, Bld: 124 mg/dL — ABNORMAL HIGH (ref 70–99)
Potassium: 3.8 mmol/L (ref 3.5–5.1)
Sodium: 137 mmol/L (ref 135–145)

## 2022-06-11 LAB — HEMOGLOBIN A1C
Hgb A1c MFr Bld: 6.1 % — ABNORMAL HIGH (ref 4.8–5.6)
Mean Plasma Glucose: 128 mg/dL

## 2022-06-11 LAB — MAGNESIUM: Magnesium: 1.6 mg/dL — ABNORMAL LOW (ref 1.7–2.4)

## 2022-06-11 MED ORDER — MAGNESIUM OXIDE -MG SUPPLEMENT 400 (240 MG) MG PO TABS
400.0000 mg | ORAL_TABLET | Freq: Two times a day (BID) | ORAL | Status: DC
  Administered 2022-06-11 – 2022-06-20 (×18): 400 mg via ORAL
  Filled 2022-06-11 (×18): qty 1

## 2022-06-11 NOTE — Progress Notes (Signed)
Patient confused, attempting during multiple times to get out of her bed and removing equipment. Provider was paged to be notified of this situation. Single dose of Haldol IV was ordered and administered to the patient to help relieve her symptoms. Patient asleep now. Will continue to monitor.

## 2022-06-11 NOTE — Consult Note (Signed)
Neurosurgery Consultation  Reason for Consult: Lumbar spine fracture, leg weakness Referring Physician: Pokhrel  CC: Leg weakness  HPI: This is a 77 y.o. woman, patient of Dr. Cleotilde Neer, that presents with left lower extremity weakness. She's unsure of the exact timing but it has been at least two weeks, no change in bowel or bladder function, baseline uses a walker but now wheelchair, no significant increase in her chronic back pain. Her PMHx is extensive as listed below including CKD / PE / PAF, COPD, chronic low back pain w/ SCS.   ROS: A 14 point ROS was performed and is negative except as noted in the HPI.   PMHx:  Past Medical History:  Diagnosis Date   CAD (coronary artery disease) 11/04/2016   Cancer ([place])    skin cancer   Cauda equina compression ([place]) 08/28/2021   Cerebrovascular disease    Chronic atrial fibrillation ([place]) 09/28/2018   Chronic bilateral low back pain without sciatica 03/08/2018   Chronic kidney disease    Chronic obstructive lung disease ([place]) 09/28/2018   Chronic pain syndrome 03/08/2018   Chronic renal failure 09/28/2018   Chronic, continuous use of opioids 03/04/2021   Last Assessment & Plan:  Formatting of this note might be different from the original. Patient and I have discussed the hazardous effects of continued opiate pain medication usage. Risks and benefits of above medications including but not limited to possibility of respiratory depression, sedation, and even death were discussed with the patient who expressed an understanding.  Patient did not displ   CKD (chronic kidney disease) stage 3, GFR 30-59 ml/min (HCC)    Class 1 obesity due to excess calories with serious comorbidity and body mass index (BMI) of 34.0 to 34.9 in adult 05/20/2021   Last Assessment & Plan: Formatting of this note might be different from the original. Patient educated about the detrimental effects of weight as it specifically pertains to pain management and overall  health.  Patient's BMI 34.08 Encouraged healthy eating habits and routine low-impact cardiovascular exercises as tolerated.   Comprehensive diabetic foot examination, type 2 DM, encounter for ([place]) 04/29/2021   DDD (degenerative disc disease), lumbar 11/17/2018   Last Assessment & Plan:  Formatting of this note might be different from the original. See spinal stenosis plan   Diabetes ([place])    Diabetes mellitus due to underlying condition with unspecified complications ([place]) [id number]   Dyshidrotic eczema 04/29/2021   Dysrhythmia    afib   Essential hypertension 11/04/2016   Facet arthritis of lumbar region 03/08/2018   Facet joint disease 11/17/2018   Last Assessment & Plan:  Formatting of this note might be different from the original. See spinal stenosis plan   Foraminal stenosis of lumbar region 03/08/2018   Heart murmur    History of knee replacement, total, bilateral 11/17/2018   Last Assessment & Plan:  Formatting of this note might be different from the original. Have recommended to her several times if any further concerns in regards to the pain status post arthroplasty, she is to follow up with her surgeon.   History of pulmonary embolism 11/28/2021   Hypertension    Hypertensive disorder 09/28/2018   Increased body mass index 09/28/2018   Lumbar radiculopathy 03/08/2018   Lumbar spondylosis 03/20/2019   Mixed dyslipidemia 11/04/2016   Morbid obesity ([place]) 11/04/2016   Muscle pain 04/29/2020   Osteoarthritis of left knee    PAF (paroxysmal atrial fibrillation) ([place]) 04/30/2015   Pain management contract agreement 04/13/2019  Last Assessment & Plan:  Formatting of this note might be different from the original. Contract updated today.  UDS completed.  [place] controlled substance registry reviewed and is consistent with her regimen.   Peripheral vascular disease ([place]) 09/28/2018   Preop cardiovascular exam 05/01/2019   Pressure injury of skin 11/21/2021   Pulmonary  [facility])    Pulmonary embolism ([place]) 11/21/2021   Renal insufficiency 04/30/2015   S/P insertion of spinal cord stimulator 07/13/2019   Last Assessment & Plan:  Formatting of this note might be different from the original. About 5 months status post insertion of spinal cord stimulator with excellent relief.  She continues to keep close follow-up with her representative and has been working to appropriately find best programs to get her adequate pain relief.   Sleep apnea    Smoker 09/28/2018   Spinal stenosis of lumbar region 03/20/2019   Spinal stenosis of lumbar region with neurogenic claudication 03/08/2018   Last Assessment & Plan:  Formatting of this note is different from the original. 77 year old female with chronic low back pain and bilateral, right greater than left, L4-5 radicular pain, and claudication. She has substantial amount of multifactorial degenerative thoracic and lumbar spine pathology.  After being deemed an inappropriate open surgical candidate, and failing to respond to multiple in   Spondylolisthesis 03/20/2019   Spondylosis of lumbosacral region without myelopathy or radiculopathy 01/30/2019   Last Assessment & Plan:  Formatting of this note might be different from the original. See spinal stenosis plan   Tinea unguium 02/03/2022   Type 2 diabetes mellitus ([place]) 09/28/2018   FamHx:  Family History  Problem Relation Age of Onset   Diabetes Mother    SocHx:  reports that she quit smoking about 3 years ago. Her smoking use included cigarettes. She has never used smokeless tobacco. She reports that she does not drink alcohol and does not use drugs.  Exam: Vital signs in last 24 hours: Temp:  [98.1 F (36.7 C)-98.5 F (36.9 C)] 98.2 F (36.8 C) (03/07 1126) Pulse Rate:  [76-100] 100 (03/07 1126) Resp:  [18-19] 18 (03/07 1126) BP: (115-148)/(58-109) 115/80 (03/07 1126) SpO2:  [95 %-99 %] 95 % (03/07 1126) General: Awake, alert, cooperative, lying in bed in  NAD Head: Normocephalic and atruamatic HEENT: Neck supple Pulmonary: increased work of breathing but at baseline per the patient and comfortable Psych: affect full and reactive Abdomen: obese S NT ND Extremities: Warm and well perfused x4 Neuro:  Strength 5/5 in BUE, no hoffman's, RLE 4+/5, LLE 4/5 except EHL/TA 1/5 and PF 4-/5, mild non-dermatomal BLE numbness that's sharply demarcated at the inguinal fold bilaterally, mildly increased tone bilaterally but no ankle clonus and reflexes 1+ bilaterally    Assessment and Plan: 77 y.o. woman with mainly left distal foot weakness but some diffuse LLE > RLE weakness. CT/MRI L-spine personally reviewed, which show severe degen changes, post-op hardware artifact at L2-3, subacute to chronic T12 fracture. At T11-12 difficult to say from SCS lead artifact but does appear to have at least moderate stenosis, unable to say definitively if there is any cord signal change  -no acute neurosurgical intervention indicated at this time, no brace needed given the fracture is old, activity as tolerated -discussed with the patient, will let Dr. Kathyrn Sheriff know she's here but given her severe co-morbidities, she's extremely high risk for surgery, will need to be discussed at some point but with weakness present for 2 weeks now, it's not urgent -please call  with any concerns or questions  Judith Part, MD 06/11/22 2:03 PM [facility]

## 2022-06-11 NOTE — Progress Notes (Signed)
Sitter at bedside now per provider's orders.

## 2022-06-11 NOTE — Progress Notes (Signed)
Patient still restless, removing equipment and trying to get out of her bed. Haldol ordered was not very effective. Provider was paged, asked for additional orders. Will continue to monitor.

## 2022-06-11 NOTE — Progress Notes (Addendum)
PROGRESS NOTE    Lisa Maxwell  [id number] DOB: [birth year] DOA: 06/09/2022 PCP: Nicoletta Dress, MD    Brief Narrative:  Lisa Maxwell is a 77 y.o. female with past medical history of atrial fibrillation on Eliquis, hypertension, COPD, diabetes mellitus type 2, hyperlipidemia, obesity, history of pulmonary embolism on Eliquis, CAD, history of cortrak tube and syndrome status post low back pain and sciatica, chronic pain syndrome and peripheral vascular disease presented to hospital with left foot pain and weakness.  Patient already has right foot weakness and is on a brace for at least a week now but now both legs were hurting.  Patient did have a fall a week back when she slumped against the wall and slid down and started having severe foot pain on the left.  She uses walker at baseline but has been wheelchair-bound at this time.   In the ED, patient had mild tachycardia.  She did have mild leukocytosis at 11.7.  Chemistry showed creatinine at 1.4.  CT scan of the lumbar spine showed L1 inferior endplate fracture, CT head was nonacute.  Chest x-ray was negative.  She was given IV Dilaudid, neurosurgery was consulted and patient was admitted hospital for further evaluation and treatment.  Assessment and plan.  L1 inferior endplate fracture with back and foot pain with ambulatory dysfunction.  Has been more wheelchair-bound at this time.  Continue analgesics.  Physical therapy evaluation.  Neurosurgery  recommend putting the patient in TSL0 brace and pain management.  Neurosurgery to follow-up.  Communicated with Dr. Kathyrn Sheriff for follow-up. [name] Confusion agitation during the nighttime.  Needed Haldol and sitter.  Currently more alert awake and Communicative.  Likely sundowning.  Provide delirium precautions.  CKD (chronic kidney disease) stage 3b Continue to monitor BMP.  Latest creatinine at 1.1.  Mild hypomagnesemia.  Will replenish.  Check levels in AM.   CAD (coronary artery  disease) Continue Mevacor, patient is on aspirin statin beta-blocker and Plavix as outpatient.   Essential hypertension Pressure borderline low on presentation.  Continue to monitor.  Improved at this time.  Med list says metoprolol but not taking at home.  Will continue to monitor.   Diabetes mellitus due to underlying condition with unspecified complications Continue sliding scale insulin Accu-Cheks diabetic diet.  Hold oral medications.   Mixed dyslipidemia Continue Mevacor    Morbid obesity  Body mass index is 35.2 kg/m.  Would benefit from weight loss as outpatient.   PAF (paroxysmal atrial fibrillation) (HCC) CHA2DS2-VASc score of 8.  Continue Eliquis.  Controlled.  Patient is not taking beta-blocker.   Chronic obstructive lung disease ([place]) Appears compensated.  Continue bronchodilators.  Follow up with pulmonary as outpatient.  Not on oxygen at baseline.   History of DVT/pulmonary embolus (PE) Continue Eliquis.   Foraminal stenosis of lumbar region with chronic pain syndrome. Continue analgesia.  Continue gabapentin and Norco.   Esophageal abnormality Barium swallow examination.  OSA (obstructive sleep apnea) CPAP    DVT prophylaxis:  apixaban (ELIQUIS) tablet 5 mg   Code Status:     Code Status: Full Code  Disposition: Uncertain at this time, PT OT pending.  Status is: Observation  The patient will require care spanning > 2 midnights and should be moved to inpatient because: Intractable pain, ambulatory dysfunction, neurosurgical evaluation, possible need for rehabilitation.   Family Communication:  I spoke with the patient's husband on the phone and updated him about the clinical condition of the patient.  Consultants:  Neurosurgery  Procedures:  T SLO brace application  Antimicrobials:  None  Anti-infectives (From admission, onward)    None      Subjective: Today, patient was seen and examined at bedside.  Complains of left lower extremity  weakness and back pain.  Denies any nausea vomiting shortness of breath fever or chills.  Patient did have a confusion and agitation during the nighttime and required sedation and sitter.  Sitter at bedside.  Objective: Vitals:   06/10/22 2318 06/11/22 0345 06/11/22 0726 06/11/22 1126  BP: 130/86 (!) 148/96 (!) 139/58 115/80  Pulse: 97 95 76 100  Resp: '18 19 18 18  '$ Temp: 98.1 F (36.7 C) 98.5 F (36.9 C) 98.3 F (36.8 C) 98.2 F (36.8 C)  TempSrc: Oral  Oral Oral  SpO2: 98% 99% 98% 95%  Weight:      Height:        Intake/Output Summary (Last 24 hours) at 06/11/2022 1234 Last data filed at 06/11/2022 1059 Gross per 24 hour  Intake 2617.14 ml  Output 1850 ml  Net 767.14 ml   Filed Weights   06/09/22 1041  Weight: 71.2 kg    Physical Examination: Body mass index is 35.2 kg/m.   General: Obese, alert awake, elderly female, obese, HENT:   No scleral pallor or icterus noted. Oral mucosa is moist.  Patient. Chest:  Clear breath sounds.  No crackles or wheezes.  Tenderness over the lumbar spine. CVS: S1 &S2 heard. No murmur.  Regular rate and rhythm. Abdomen: Soft, nontender, nondistended.  Bowel sounds are heard.   Extremities: No cyanosis, clubbing or bilateral lower extremity edema.  Peripheral pulses are palpable. Psych: Alert, awake and, calm mood at this time, oriented to place and person.  CNS:  No cranial nerve deficits.  Decreased strength over the lower extremity mostly on the left side.   Skin: Warm and dry.  No rashes noted.  Data Reviewed:   CBC: Recent Labs  Lab 06/09/22 1102 06/10/22 0732 06/11/22 0714  WBC 11.7* 7.1 6.2  NEUTROABS 9.9*  --   --   HGB 15.1* 14.0 13.5  HCT 47.3* 43.2 42.2  MCV 96.7 95.2 95.5  PLT 207 188 187     Basic Metabolic Panel: Recent Labs  Lab 06/09/22 1102 06/10/22 0732 06/11/22 0714  NA 136 138 137  K 4.6 4.6 3.8  CL 103 107 107  CO2 24 19* 22  GLUCOSE 120* 120* 124*  BUN '22 16 14  '$ CREATININE 1.40* 1.31* 1.12*   CALCIUM 10.8* 10.0 10.1  MG  --  1.8 1.6*  PHOS  --  2.6  --      Liver Function Tests: Recent Labs  Lab 06/09/22 1102 06/10/22 0732  AST 35 34  ALT 25 24  ALKPHOS 65 60  BILITOT 0.6 0.6  PROT 6.7 6.1*  ALBUMIN 3.8 3.3*      Radiology Studies: DG ESOPHAGUS W SINGLE CM (SOL OR THIN BA)  Result Date: 06/10/2022 CLINICAL DATA:  77 year old female admitted with weakness. Possible esophageal abnormality noted on imaging. Barium swallow evaluation requested. EXAM: ESOPHAGUS/BARIUM SWALLOW/TABLET STUDY TECHNIQUE: Single contrast examination was performed using thin liquid barium. This exam was performed by Brynda Greathouse PA-C, and was supervised and interpreted by Claudie Revering, MD. FLUOROSCOPY: Radiation Exposure Index (as provided by the fluoroscopic device): 23.2 mGy Kerma COMPARISON:  CT Thoracic Spine 06/09/22 FINDINGS: Swallowing: Appears normal. No vestibular penetration or aspiration seen. Pharynx: Unremarkable. Esophagus: Smooth narrowing of the distal esophagus at the gastroesophageal  junction without stricture. Esophageal motility: Tertiary contractions most pronounced in the mid esophagus leading to moderate dysmotility and delayed passage of barium bolus. Hiatal Hernia: None. Gastroesophageal reflux: None visualized. Ingested 79m barium tablet: Became stuck in the distal esophagus Other: Limited exam due to patient immobility and acute illness. IMPRESSION: 1. Narrowing of the distal esophagus which does not allow passage of the 120mbarium tablet. 2.  Tertiary contractions leading to moderate dysmotility. Electronically Signed   By: StClaudie Revering.D.   On: 06/10/2022 13:53   MR LUMBAR SPINE WO CONTRAST  Result Date: 06/09/2022 CLINICAL DATA:  Right foot drop for a week and a half. EXAM: MRI LUMBAR SPINE WITHOUT CONTRAST TECHNIQUE: Multiplanar, multisequence MR imaging of the lumbar spine was performed. No intravenous contrast was administered. COMPARISON:  Lumbar CT from earlier the  same day FINDINGS: Segmentation:  Standard. Alignment:  Dextroscoliosis. Vertebrae: L1 inferior endplate fracture with [id number] loss measured by CT. Fracture is potentially subacute by CT. Mild concavity of the L2 superior endplate as well. Discogenic endplate edema at the L1 and L2 bodies. No aggressive bone lesion. Conus medullaris and cauda equina: Conus extends to the T12-L1 level. The conus appears T2 hyperintense at the level of the gray matter although only seen at levels where there is artifact from spinal cord stimulator. Paraspinal and other soft tissues: Postoperative scarring and atrophy of intrinsic back muscles. Sigmoid diverticulosis. Disc levels: T10-11: Disc collapse with endplate and facet spurring causing biforaminal impingement and at least moderate spinal stenosis. T11-12: Disc collapse and endplate degeneration with circumferential bulging and facet spurring. Biforaminal impingement. Signs of high-grade spinal stenosis although likely exacerbated by artifact from the hardware. T12- L1: Degenerative facet spurring asymmetric to the right. The canal and foramina are patent L1-L2: Disc narrowing and bulging with endplate spurring. Moderate spinal stenosis. Left more than right foraminal impingement. L2-L3: Fusion with no visible impingement L3-L4: Degenerative facet spurring which is bulky. Circumferential disc bulging with left foraminal annular fissure. Moderate spinal stenosis. Moderate right foraminal narrowing L4-L5: Degenerative facet spurring with bulky hypertrophy asymmetric to the right. Mild anterolisthesis and circumferential disc bulging. Moderate spinal stenosis. Right foraminal impingement. L5-S1:Bulky degenerative facet spurring. IMPRESSION: 1. Advanced and generalized lumbar spine degeneration with scoliosis and L4-5 anterolisthesis. 2. L1 inferior endplate fracture which is subacute to chronic. L1 and L2 vertebral body edema which could be degenerative or traumatic. 3. T11-12  severe degeneration with spinal stenosis and apparent cord impingement that is likely accentuated by susceptibility artifact from spinal cord stimulator leads. On axial T2 weighted imaging there is question of lower cord edema, correlate for myelopathy. Severe biforaminal impingement at this level. 4. Foraminal stenosis on the right at L1-2, L3-4, and L4-5. 5. Left foraminal stenosis primarily at L1-2. 6. Moderate spinal stenosis at L3-4 and L4-5. Electronically Signed   By: JoJorje Guild.D.   On: 06/09/2022 19:58   CT Thoracic Spine Wo Contrast  Result Date: 06/09/2022 CLINICAL DATA:  Back trauma EXAM: CT THORACIC SPINE WITHOUT CONTRAST TECHNIQUE: Multidetector CT images of the thoracic were obtained using the standard protocol without intravenous contrast. RADIATION DOSE REDUCTION: This exam was performed according to the departmental dose-optimization program which includes automated exposure control, adjustment of the mA and/or kV according to patient size and/or use of iterative reconstruction technique. COMPARISON:  None Available. FINDINGS: Alignment: Normal. Vertebrae: No acute fracture or focal pathologic process. The see separately dictated lumbar spine CT for description of osseous changes of the L1 vertebral body level.  Paraspinal and other soft tissues: Small amount of secretions in the esophagus. Moderate centrilobular emphysema. Ground-glass opacity in the right lobe, nonspecific. Gallbladder is partially imaged and appears distended. Right basilar atelectasis. Is mild nonspecific soft tissue stranding of the GE junction (series 19, image 105). Large left-sided thyroid nodule measuring up to 1.8 cm. Disc levels: Spinal nerve stimulator leads terminate at the T6-T9 levels. IMPRESSION: 1. No acute fracture or traumatic malalignment of the thoracic spine. 2. Small amount of secretions/debris in the esophagus with mild nonspecific soft tissue stranding at the GE junction. Correlate for symptoms of  dysphagia. Ground-glass opacity in the right upper lobe, nonspecific. 3. Spinal nerve stimulator leads terminate at the T6-T9 levels. 4. 1.8 cm left thyroid nodule. Recommend further evaluation with a dedicated thyroid ultrasound. 5. Please refer to the separately dictated lumbar spine CT for description of osseous changes of the L1 vertebral body level. Emphysema (ICD10-J43.9). Electronically Signed   By: Marin Roberts M.D.   On: 06/09/2022 13:01   CT Lumbar Spine Wo Contrast  Result Date: 06/09/2022 CLINICAL DATA:  Right foot drop. Worsening leg weakness over the last 2 weeks. Back trauma. EXAM: CT LUMBAR SPINE WITHOUT CONTRAST TECHNIQUE: Multidetector CT imaging of the lumbar spine was performed without intravenous contrast administration. Multiplanar CT image reconstructions were also generated. RADIATION DOSE REDUCTION: This exam was performed according to the departmental dose-optimization program which includes automated exposure control, adjustment of the mA and/or kV according to patient size and/or use of iterative reconstruction technique. COMPARISON:  Radiography 09/22/2021.  MRI 08/15/2021 FINDINGS: Segmentation: 5 lumbar type vertebral bodies as numbered previously. Alignment: Lower thoracic and upper lumbar curvature convex to the right. Vertebrae: Chronic degenerative endplate changes at [id number]. Compression fracture/insufficiency fracture of the inferior endplate of L1. Probable solid union at the L2-3 fusion level. Paraspinal and other soft tissues: Negative. Neurostimulator in place entering the T12-L1 interlaminar space. Disc levels: T11-12: Chronic disc degeneration with loss of disc height. Chronic endplate sclerosis and cystic change. Circumferential protrusion of the disc. Potential for significant stenosis at this level. T12-L1: Disc bulge. No compressive stenosis. Neurostimulator enters posteriorly at this level. L1-2: Progressive disc degeneration with vacuum phenomenon. Inferior  endplate compression fracture or insufficiency fracture at L1 with loss of height anteriorly of 30%. Circumferential bulging of the disc. Multifactorial stenosis at this level that could be significant. Pedicle screw on the right comes very close to or breaches the superior endplate. L2-3: Previous discectomy and fusion procedure. Probable solid union. Sufficient decompression of the canal. Some potential for ongoing foraminal stenosis on the right. L3-4: Bulging of the disc. Facet and ligamentous hypertrophy. Moderate multifactorial stenosis. Bilateral foraminal stenosis. L4-5: Chronic facet arthropathy with anterolisthesis of 3 mm. Bulging of the disc. Moderate multifactorial stenosis. Bilateral foraminal stenosis. L5-S1: Bulging of the disc. Bilateral facet degeneration and hypertrophy. Stenosis of the subarticular lateral recesses. Sufficient patency of the central canal and neural foramina. Bilateral sacroiliac osteoarthritis is present IMPRESSION: 1. T11-12: Chronic disc degeneration with loss of disc height. Chronic endplate sclerosis and cystic change. Circumferential protrusion of the disc. Potential for significant stenosis at this level. 2. L1-2: Progressive disc degeneration with vacuum phenomenon. Inferior endplate compression fracture or insufficiency fracture at L1 with loss of height anteriorly of 30%. Circumferential bulging of the disc. Multifactorial stenosis at this level that could be significant. Pedicle screw on the right comes very close to or breaches the superior endplate. 3. L2-3: Previous discectomy and fusion procedure. Probable solid union. Sufficient decompression of the  canal. Some potential for ongoing foraminal stenosis on the right. 4. L3-4: Moderate multifactorial stenosis. Bilateral foraminal stenosis. 5. L4-5: Chronic facet arthropathy with anterolisthesis of 3 mm. Bulging of the disc. Moderate multifactorial stenosis. Bilateral foraminal stenosis. 6. L5-S1: Bilateral facet  degeneration and hypertrophy. Stenosis of the subarticular lateral recesses. Sufficient patency of the central canal and neural foramina. Electronically Signed   By: Nelson Chimes M.D.   On: 06/09/2022 12:58   CT Head Wo Contrast  Result Date: 06/09/2022 CLINICAL DATA:  Head trauma, minor (Age >= 65y); Neck trauma (Age >= 65y) EXAM: CT HEAD WITHOUT CONTRAST CT CERVICAL SPINE WITHOUT CONTRAST TECHNIQUE: Multidetector CT imaging of the head and cervical spine was performed following the standard protocol without intravenous contrast. Multiplanar CT image reconstructions of the cervical spine were also generated. RADIATION DOSE REDUCTION: This exam was performed according to the departmental dose-optimization program which includes automated exposure control, adjustment of the mA and/or kV according to patient size and/or use of iterative reconstruction technique. COMPARISON:  11/21/2021 FINDINGS: CT HEAD FINDINGS Brain: No evidence of acute infarction, hemorrhage, hydrocephalus, extra-axial collection or mass lesion/mass effect. Chronic small left occipital lobe infarct. Patchy low-density changes within the periventricular and subcortical white matter most compatible with chronic microvascular ischemic change. Mild diffuse cerebral volume loss. Vascular: Atherosclerotic calcifications involving the large vessels of the skull base. No unexpected hyperdense vessel. Skull: Normal. Negative for fracture or focal lesion. Sinuses/Orbits: No acute finding. 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 Other: Negative for scalp hematoma. CT CERVICAL SPINE FINDINGS Alignment: Facet joints are aligned without dislocation or traumatic listhesis. Dens and lateral masses are aligned. Grade 1 anterolisthesis of C3 on C4, C4 on C5, and C5 on C6 mediated by degenerative facet arthropathy. Skull base and vertebrae: No acute fracture. No suspicious lytic or sclerotic bone lesion.  There is a 1.5 cm well-circumscribed bony protuberance arising posteriorly from the left C3 lamina, likely reflecting an osteochondroma. Soft tissues and spinal canal: No prevertebral fluid or swelling. No visible canal hematoma. Disc levels: Moderate-advanced multilevel cervical spondylosis with both degenerative disc disease and facet arthropathy. Upper chest: Mild emphysematous changes at the lung apices. Other: Bilateral carotid atherosclerosis. IMPRESSION: 1. No acute intracranial abnormality. 2. No acute fracture or traumatic listhesis of the cervical spine. Electronically Signed   By: Davina Poke D.O.   On: 06/09/2022 12:56   CT Cervical Spine Wo Contrast  Result Date: 06/09/2022 CLINICAL DATA:  Head trauma, minor (Age >= 65y); Neck trauma (Age >= 65y) EXAM: CT HEAD WITHOUT CONTRAST CT CERVICAL SPINE WITHOUT CONTRAST TECHNIQUE: Multidetector CT imaging of the head and cervical spine was performed following the standard protocol without intravenous contrast. Multiplanar CT image reconstructions of the cervical spine were also generated. RADIATION DOSE REDUCTION: This exam was performed according to the departmental dose-optimization program which includes automated exposure control, adjustment of the mA and/or kV according to patient size and/or use of iterative reconstruction technique. COMPARISON:  11/21/2021 FINDINGS: CT HEAD FINDINGS Brain: No evidence of acute infarction, hemorrhage, hydrocephalus, extra-axial collection or mass lesion/mass effect. Chronic small left occipital lobe infarct. Patchy low-density changes within the periventricular and subcortical white matter most compatible with chronic microvascular ischemic change. Mild diffuse cerebral volume loss. Vascular: Atherosclerotic calcifications involving the large vessels of the skull base. No unexpected hyperdense vessel. Skull: Normal. Negative for fracture or focal lesion. Sinuses/Orbits: No acute finding. Chronic nasopharyngeal  polyp on the left, similar in size and  appearance to the previous exam. Other: Negative for scalp hematoma. CT CERVICAL SPINE FINDINGS Alignment: Facet joints are aligned without dislocation or traumatic listhesis. Dens and lateral masses are aligned. Grade 1 anterolisthesis of C3 on C4, C4 on C5, and C5 on C6 mediated by degenerative facet arthropathy. Skull base and vertebrae: No acute fracture. No suspicious lytic or sclerotic bone lesion. There is a 1.5 cm well-circumscribed bony protuberance arising posteriorly from the left C3 lamina, likely reflecting an osteochondroma. Soft tissues and spinal canal: No prevertebral fluid or swelling. No visible canal hematoma. Disc levels: Moderate-advanced multilevel cervical spondylosis with both degenerative disc disease and facet arthropathy. Upper chest: Mild emphysematous changes at the lung apices. Other: Bilateral carotid atherosclerosis. IMPRESSION: 1. No acute intracranial abnormality. 2. No acute fracture or traumatic listhesis of the cervical spine. Electronically Signed   By: Davina Poke D.O.   On: 06/09/2022 12:56      LOS: 0 days    Flora Lipps, MD [facility] Available via Epic secure chat 7am-7pm After these hours, please refer to coverage provider listed on [email or web address] 06/11/2022, 12:34 PM

## 2022-06-11 NOTE — Consult Note (Signed)
Chief Complaint   Chief Complaint  Patient presents with   Extremity Weakness   Leg Pain    History of Present Illness  Lisa Maxwell is a 77 y.o. female known to me from previous lumbar fusion last year with history of chronic back pain and right foot drop. She presented to ED about 1 week after a ground level fall at home. Since then she was reporting worsening of chronic back pain and primarily left leg weakness. CT and MRI were done in the ED demonstrating continued stenosis and L1 fracture. Neurosurgical consult was therefore requested.   Upon questioning today, she reports significant improvement in her back pain and left leg weakness with use of the TLSO brace. She is able to transfer from bed to chair, hasn't walked with PT in hallway yet. No sx of urinary retention.  Past Medical History   Past Medical History:  Diagnosis Date   CAD (coronary artery disease) 11/04/2016   Cancer ([place])    skin cancer   Cauda equina compression ([place]) 08/28/2021   Cerebrovascular disease    Chronic atrial fibrillation ([place]) 09/28/2018   Chronic bilateral low back pain without sciatica 03/08/2018   Chronic kidney disease    Chronic obstructive lung disease ([place]) 09/28/2018   Chronic pain syndrome 03/08/2018   Chronic renal failure 09/28/2018   Chronic, continuous use of opioids 03/04/2021   Last Assessment & Plan:  Formatting of this note might be different from the original. Patient and I have discussed the hazardous effects of continued opiate pain medication usage. Risks and benefits of above medications including but not limited to possibility of respiratory depression, sedation, and even death were discussed with the patient who expressed an understanding.  Patient did not displ   CKD (chronic kidney disease) stage 3, GFR 30-59 ml/min (HCC)    Class 1 obesity due to excess calories with serious comorbidity and body mass index (BMI) of 34.0 to 34.9 in adult 05/20/2021   Last Assessment &  Plan: Formatting of this note might be different from the original. Patient educated about the detrimental effects of weight as it specifically pertains to pain management and overall health.  Patient's BMI 34.08 Encouraged healthy eating habits and routine low-impact cardiovascular exercises as tolerated.   Comprehensive diabetic foot examination, type 2 DM, encounter for ([place]) 04/29/2021   DDD (degenerative disc disease), lumbar 11/17/2018   Last Assessment & Plan:  Formatting of this note might be different from the original. See spinal stenosis plan   Diabetes ([place])    Diabetes mellitus due to underlying condition with unspecified complications ([place]) [id number]   Dyshidrotic eczema 04/29/2021   Dysrhythmia    afib   Essential hypertension 11/04/2016   Facet arthritis of lumbar region 03/08/2018   Facet joint disease 11/17/2018   Last Assessment & Plan:  Formatting of this note might be different from the original. See spinal stenosis plan   Foraminal stenosis of lumbar region 03/08/2018   Heart murmur    History of knee replacement, total, bilateral 11/17/2018   Last Assessment & Plan:  Formatting of this note might be different from the original. Have recommended to her several times if any further concerns in regards to the pain status post arthroplasty, she is to follow up with her surgeon.   History of pulmonary embolism 11/28/2021   Hypertension    Hypertensive disorder 09/28/2018   Increased body mass index 09/28/2018   Lumbar radiculopathy 03/08/2018   Lumbar spondylosis 03/20/2019   Mixed  dyslipidemia 11/04/2016   Morbid obesity ([place]) 11/04/2016   Muscle pain 04/29/2020   Osteoarthritis of left knee    PAF (paroxysmal atrial fibrillation) ([place]) 04/30/2015   Pain management contract agreement 04/13/2019   Last Assessment & Plan:  Formatting of this note might be different from the original. Contract updated today.  UDS completed.  [place] controlled substance registry  reviewed and is consistent with her regimen.   Peripheral vascular disease ([place]) 09/28/2018   Preop cardiovascular exam 05/01/2019   Pressure injury of skin 11/21/2021   Pulmonary emboli [facility])    Pulmonary embolism ([place]) 11/21/2021   Renal insufficiency 04/30/2015   S/P insertion of spinal cord stimulator 07/13/2019   Last Assessment & Plan:  Formatting of this note might be different from the original. About 5 months status post insertion of spinal cord stimulator with excellent relief.  She continues to keep close follow-up with her representative and has been working to appropriately find best programs to get her adequate pain relief.   Sleep apnea    Smoker 09/28/2018   Spinal stenosis of lumbar region 03/20/2019   Spinal stenosis of lumbar region with neurogenic claudication 03/08/2018   Last Assessment & Plan:  Formatting of this note is different from the original. 77 year old female with chronic low back pain and bilateral, right greater than left, L4-5 radicular pain, and claudication. She has substantial amount of multifactorial degenerative thoracic and lumbar spine pathology.  After being deemed an inappropriate open surgical candidate, and failing to respond to multiple in   Spondylolisthesis 03/20/2019   Spondylosis of lumbosacral region without myelopathy or radiculopathy 01/30/2019   Last Assessment & Plan:  Formatting of this note might be different from the original. See spinal stenosis plan   Tinea unguium 02/03/2022   Type 2 diabetes mellitus ([place]) 09/28/2018    Past Surgical History   Past Surgical History:  Procedure Laterality Date   ABDOMINAL HYSTERECTOMY     BACK SURGERY     EYE SURGERY     bilateral cataracts   IR ANGIOGRAM PULMONARY BILATERAL SELECTIVE  11/21/2021   IR ANGIOGRAM SELECTIVE EACH ADDITIONAL VESSEL  11/21/2021   IR ANGIOGRAM SELECTIVE EACH ADDITIONAL VESSEL  11/21/2021   IR THROMBECT PRIM MECH INIT (INCLU) MOD SED  11/21/2021   IR US GUIDE VASC  ACCESS RIGHT  11/21/2021   JOINT REPLACEMENT Bilateral    knees   RADIOLOGY WITH ANESTHESIA N/A 11/21/2021   Procedure: IR WITH ANESTHESIA;  Surgeon: Radiologist, Medication, MD;  Location: [facility];  Service: Radiology;  Laterality: N/A;   SPINAL CORD STIMULATOR INSERTION N/A 07/07/2019   Procedure: LUMBAR SPINAL CORD STIMULATOR INSERTION;  Surgeon: Clydell Hakim, MD;  Location: [facility];  Service: Neurosurgery;  Laterality: N/A;   total left knee      Social History   Social History   Tobacco Use   Smoking status: Former    Types: Cigarettes    Quit date: 05/08/2019    Years since quitting: 3.0   Smokeless tobacco: Never  Vaping Use   Vaping Use: Never used  Substance Use Topics   Alcohol use: No   Drug use: No    Medications   Prior to Admission medications   Medication Sig Start Date End Date Taking? Authorizing Provider  apixaban (ELIQUIS) 5 MG TABS tablet Take 1 tablet (5 mg total) by mouth 2 (two) times daily. 11/28/21  Yes Revankar, Reita Cliche, MD  Coenzyme Q10 (CO Q-10) 200 MG CAPS Take 200 mg by mouth  in the morning.   Yes [provider]  Cyanocobalamin (VITAMIN B-12) 500 MCG SUBL Place 500 mcg under the tongue in the morning.   Yes [provider]  diphenhydrAMINE (BENADRYL) 25 MG tablet Take 25 mg by mouth in the morning.   Yes [provider]  docusate sodium (COLACE) 100 MG capsule Take 100 mg by mouth daily. 10/08/21  Yes [provider]  fluticasone furoate-vilanterol (BREO ELLIPTA) 100-25 MCG/ACT AEPB Inhale 1-2 puffs into the lungs daily.   Yes [provider]  gabapentin (NEURONTIN) 600 MG tablet Take 600 mg by mouth at bedtime. 04/06/21  Yes [provider]  HYDROcodone-acetaminophen (NORCO) 10-325 MG tablet Take 0.5 tablets by mouth 4 (four) times daily.   Yes [provider]  Iron, Ferrous Sulfate, 325 (65 Fe) MG TABS Take 65 mg of iron by mouth daily with breakfast.   Yes [provider]  lovastatin  (MEVACOR) 40 MG tablet Take 40 mg by mouth at bedtime.    Yes [provider]  methocarbamol (ROBAXIN) 500 MG tablet Take 1 tablet (500 mg total) by mouth every 6 (six) hours as needed for muscle spasms. Patient taking differently: Take 250 mg by mouth 3 (three) times daily. 08/29/21  Yes Consuella Lose, MD  Multiple Vitamins-Minerals (CENTRUM SILVER 50+WOMEN) TABS Take 1 tablet by mouth daily with breakfast.   Yes [provider]  naloxone (NARCAN) nasal spray 4 mg/0.1 mL Place 1 spray into the nose daily as needed (accidental overdose).   Yes [provider]  nitroGLYCERIN (NITROSTAT) 0.4 MG SL tablet Place 0.4 mg under the tongue every 5 (five) minutes as needed for chest pain.   Yes [provider]  pantoprazole (PROTONIX) 40 MG tablet Take 1 tablet (40 mg total) by mouth daily. Patient taking differently: Take 40 mg by mouth daily as needed (for acid reflux). 11/25/21  Yes Ghimire, Henreitta Leber, MD  RYBELSUS 3 MG TABS Take 3 mg by mouth daily before breakfast. 05/12/21  Yes [provider]  triamcinolone ointment (KENALOG) 0.5 % Apply 1 Application topically See admin instructions. Apply to affected areas of both feet two to three times a week   Yes [provider]  trimethoprim (TRIMPEX) 100 MG tablet Take 100 mg by mouth in the morning. 10/25/21  Yes [provider]  latanoprost (XALATAN) 0.005 % ophthalmic solution Place 1 drop into both eyes at bedtime. Patient not taking: Reported on 06/09/2022 11/17/21   [provider]  metoprolol tartrate (LOPRESSOR) 25 MG tablet Take 0.5 tablets (12.5 mg total) by mouth 2 (two) times daily. Patient not taking: Reported on 06/09/2022 11/24/21   Jonetta Osgood, MD  NONFORMULARY OR COMPOUNDED ITEM Please evaluate and treat for outpatient PT Diagnoses: Deconditioning due to Acute Hypoxia/Shock from saddle embolus 11/24/21   Jonetta Osgood, MD    Allergies  No Known Allergies  Review  of Systems  ROS  Neurologic Exam  Awake, alert, oriented Memory and concentration grossly intact Speech fluent, appropriate CN grossly intact Motor exam: Upper Extremities Deltoid Bicep Tricep Grip  Right 5/5 5/5 5/5 5/5  Left 5/5 5/5 5/5 5/5   Lower Extremities IP Quad PF DF EHL  Right 5/5 5/5 4/5 1/5 1/5  Left 4+/5 4+/5 4/5 3/5 3/5   Sensation grossly intact to LT  Imaging  CT and MRI L-spine reviewed. By my review in comparison to previous MRI last year, there is largely stable T11-12 stenosis although there is significant artifact from SCS  leads. Again seen in the L1 compression fracture, likely subacute. There is moderate stenosis at L3-4, again stable.  Impression  - 77 y.o. female s/p fall with worsened back pain and subjective left leg weakness. Suspect symptoms are largely related to L1 fracture rather than stable stenosis at T11-12 and L3-4. Symptoms improved with TLSO and PT/OT/pain control  Plan  - Would cont with current plan including TLSO brace when up, oral pain control and continued PT/OT - Can f/u in outpatient NS clinic upon discharge for further followup of L1 fracture.  Consuella Lose, MD [facility]

## 2022-06-11 NOTE — Evaluation (Signed)
Physical Therapy Evaluation Patient Details Name: Lisa Maxwell MRN: [id number] DOB: 02/02/1946 Today's Date: 06/11/2022  History of Present Illness  77 y.o. female who was admitted 06/09/22 for L foot pain and weakness with hx of recent fall. Pt found to have L1 fracture. PMH: A-fib on Eliquis, HTN, COPD, diabetes, HLD, obesity, history of pulmonary embolism on Eliquis, CAD, history of cauda equina syndrome bilateral low back pain with sciatica, CKD, chronic pain syndrome PVD.   Clinical Impression  Pt presents with condition above and deficits mentioned below, see PT Problem List. PTA, she was mod I for short household distance gait bouts using a rollator vs RW until she fell recently and her L leg began to get weak x2 weeks PTA, then she began to use her w/c more. Her husband intermittently assists her with transfers. She lives with her husband in a split-level house with a ramped entrance. She can stay on the main level of the house if needed, but also has a stair lift. Currently, pt is demonstrating deficits in L ankle dorsiflexion strength (which pt and husband report began shortly before admission), chronic R ankle dorsiflexion weakness, balance deficits, decreased activity tolerance, and impaired memory, processing speed, and sequencing. She required minA to roll, modA to transition sidelying <> sit EOB, and modA to transfer to stand and take a few steps with a RW to transfer from the bed to the chair. She displays a tendency to flex at her trunk and bil knees along with a tendency to lean posteriorly when standing that places her at high risk for falls. Recommending short-term rehab at a SNF. Will continue to follow acutely.     Recommendations for follow up therapy are one component of a multi-disciplinary discharge planning process, led by the attending physician.  Recommendations may be updated based on patient status, additional functional criteria and insurance authorization.  Follow Up  Recommendations Skilled nursing-short term rehab (<3 hours/day) Can patient physically be transported by private vehicle: Yes    Assistance Recommended at Discharge Frequent or constant Supervision/Assistance  Patient can return home with the following  A lot of help with walking and/or transfers;A lot of help with bathing/dressing/bathroom;Assistance with cooking/housework;Direct supervision/assist for medications management;Direct supervision/assist for financial management;Assist for transportation;Help with stairs or ramp for entrance    Equipment Recommendations None recommended by PT (has all needed DME)  Recommendations for Other Services       Functional Status Assessment Patient has had a recent decline in their functional status and demonstrates the ability to make significant improvements in function in a reasonable and predictable amount of time.     Precautions / Restrictions Precautions Precautions: Fall;Back;Other (comment) Precaution Comments: TLSO brace - may remove to shower; R foot drop - AFO in room Restrictions Weight Bearing Restrictions: No      Mobility  Bed Mobility Overal bed mobility: Needs Assistance Bed Mobility: Rolling, Sidelying to Sit, Sit to Sidelying Rolling: Min assist Sidelying to sit: Mod assist     Sit to sidelying: Mod assist General bed mobility comments: Pt needing repeated cues to flex leg and reach across to contralateral rail while not twisting trunk to roll either direction, minA. ModA to manage trunk up to sit and scoot hips to EOB to sit up. ModA to manage legs and trunk to supine    Transfers Overall transfer level: Needs assistance Equipment used: Rolling walker (2 wheels) Transfers: Sit to/from Stand, Bed to chair/wheelchair/BSC Sit to Stand: Mod assist   Step pivot transfers:  Mod assist, +2 safety/equipment       General transfer comment: Pt needing modA to power up to stand from EOB 1x and from recliner 1x,  demonstrating a posterior bias and flexed posture. ModA for stability, preventing posterior LOB, and to cue upright posture and for pt to remain within the RW to stand step to R bed > recliner    Ambulation/Gait Ambulation/Gait assistance: Mod assist, +2 safety/equipment Gait Distance (Feet): 4 Feet Assistive device: Rolling walker (2 wheels) Gait Pattern/deviations: Step-to pattern, Decreased step length - right, Decreased step length - left, Decreased stride length, Decreased dorsiflexion - right, Decreased dorsiflexion - left, Shuffle, Trunk flexed, Knee flexed in stance - right, Knee flexed in stance - left Gait velocity: reduced Gait velocity interpretation: <1.31 ft/sec, indicative of household ambulator   General Gait Details: Pt taking small, shuffling and unsteady steps from bed to recliner using a RW. Pt tends to push the RW distal to her and flex at the knees and trunk, needing repeated cues to remain within RW and push up on RW to stand upright. Pt with posterior bias, needing modA to prevent LOB  Stairs            Wheelchair Mobility    Modified Rankin (Stroke Patients Only)       Balance Overall balance assessment: Needs assistance Sitting-balance support: Single extremity supported, Bilateral upper extremity supported, Feet supported Sitting balance-Leahy Scale: Poor Sitting balance - Comments: Pt relying on UE support, min guard-minA to sit statically EOB. Postural control: Posterior lean Standing balance support: Bilateral upper extremity supported, During functional activity, Reliant on assistive device for balance Standing balance-Leahy Scale: Poor Standing balance comment: Reliant on RW and external physical assistance                             Pertinent Vitals/Pain Pain Assessment Pain Assessment: Faces Faces Pain Scale: Hurts even more Pain Location: low back Pain Descriptors / Indicators: Discomfort, Grimacing, Guarding Pain  Intervention(s): Limited activity within patient's tolerance, Monitored during session, Repositioned    Home Living Family/patient expects to be discharged to:: Private residence Living Arrangements: Spouse/significant other Available Help at Discharge: Family;Available 24 hours/day Type of Home: House Home Access: Ramped entrance     Alternate Level Stairs-Number of Steps: 4 steps - Has stair lift Home Layout: Two level;Bed/bath upstairs;Able to live on main level with bedroom/bathroom (Sleeps on couch) Home Equipment: Rolling Walker (2 wheels);Rollator (4 wheels);BSC/3in1;Shower seat - built in;Grab bars - tub/shower;Wheelchair - Sport and exercise psychologist Comments: Has had walk in handicapped shower installed    Prior Function Prior Level of Function : Needs assist             Mobility Comments: Was able to use rollator vs RW mod I up to ~20 ft at a time due to back pain until recently, has been using w/c lately due to L leg weakness that began x2 weeks PTA; intermittent assistance for transfers ADLs Comments: Pt reports that husband assists PRN with ADL's, getting into shower, does her own bathing and dressing & ocassionally assists with getting her incontinence panties on or assist with shoes/brace.     Hand Dominance   Dominant Hand: Right    Extremity/Trunk Assessment   Upper Extremity Assessment Upper Extremity Assessment: Defer to OT evaluation    Lower Extremity Assessment Lower Extremity Assessment: LLE deficits/detail;RLE deficits/detail;Generalized weakness RLE Deficits / Details: hx of R foot drop, uses AFO; MMT score  of 4+ knee extension LLE Deficits / Details: questionable activation of her L anterior tibialis muscle (0-1/5 MMT) but able to actively plantarflex her ankle; MMT score of 4+ knee extension    Cervical / Trunk Assessment Cervical / Trunk Assessment: Other exceptions Cervical / Trunk Exceptions: L1 fx  Communication   Communication: HOH   Cognition Arousal/Alertness: Awake/alert Behavior During Therapy: WFL for tasks assessed/performed Overall Cognitive Status: Impaired/Different from baseline Area of Impairment: Attention, Following commands, Awareness, Problem solving, Memory                   Current Attention Level: Selective Memory: Decreased recall of precautions Following Commands: Follows one step commands consistently, Follows one step commands with increased time, Follows multi-step commands inconsistently   Awareness: Emergent Problem Solving: Slow processing, Difficulty sequencing, Requires verbal cues, Requires tactile cues General Comments: Pt needing multi-modal cues and extra time to sequence all mobility and reminders to maintain spinal precautions.        General Comments General comments (skin integrity, edema, etc.): TLSO small at waist, donned in supine and requested ortho techs bring waist extensions    Exercises     Assessment/Plan    PT Assessment Patient needs continued PT services  PT Problem List Decreased strength;Decreased activity tolerance;Decreased balance;Decreased mobility;Decreased coordination;Decreased cognition;Decreased range of motion;Decreased safety awareness;Decreased knowledge of precautions;Decreased knowledge of use of DME       PT Treatment Interventions DME instruction;Gait training;Stair training;Functional mobility training;Therapeutic activities;Therapeutic exercise;Balance training;Neuromuscular re-education;Cognitive remediation;Patient/family education    PT Goals (Current goals can be found in the Care Plan section)  Acute Rehab PT Goals Patient Stated Goal: to improve PT Goal Formulation: With patient/family Time For Goal Achievement: 06/25/22 Potential to Achieve Goals: Good    Frequency Min 3X/week     Co-evaluation               AM-PAC PT "6 Clicks" Mobility  Outcome Measure Help needed turning from your back to your side while in a  flat bed without using bedrails?: A Little Help needed moving from lying on your back to sitting on the side of a flat bed without using bedrails?: A Lot Help needed moving to and from a bed to a chair (including a wheelchair)?: A Lot Help needed standing up from a chair using your arms (e.g., wheelchair or bedside chair)?: A Lot Help needed to walk in hospital room?: Total Help needed climbing 3-5 steps with a railing? : Total 6 Click Score: 11    End of Session Equipment Utilized During Treatment: Gait belt;Back brace Activity Tolerance: Patient tolerated treatment well Patient left: in chair;with call bell/phone within reach;with nursing/sitter in room;with family/visitor present Freight forwarder present stating no need for chair alarm) Nurse Communication: Mobility status (sitter, who provided +2 safety throughout) PT Visit Diagnosis: Unsteadiness on feet (R26.81);Other abnormalities of gait and mobility (R26.89);Muscle weakness (generalized) (M62.81);History of falling (Z91.81);Difficulty in walking, not elsewhere classified (R26.2);Pain Pain - part of body:  (back)    Time: [id number] PT Time Calculation (min) (ACUTE ONLY): 30 min   Charges:   PT Evaluation $PT Eval Moderate Complexity: 1 Mod PT Treatments $Therapeutic Activity: 8-22 mins        Moishe Spice, PT, DPT Acute Rehabilitation Services  Office: [phone]   Orvan Falconer 06/11/2022, 2:36 PM

## 2022-06-11 NOTE — Progress Notes (Signed)
Patient still confused and restless at this time.

## 2022-06-11 NOTE — Progress Notes (Signed)
RT NOTE:   Decision was made by RT to not place CPAP on patient tonight. Pt has a sitter due to confusion and attempts to get out of bed. Im worried that the patient would not be able to removed the CPAP mask if needed. RN aware.

## 2022-06-12 DIAGNOSIS — J449 Chronic obstructive pulmonary disease, unspecified: Secondary | ICD-10-CM | POA: Diagnosis not present

## 2022-06-12 DIAGNOSIS — N1832 Chronic kidney disease, stage 3b: Secondary | ICD-10-CM | POA: Diagnosis not present

## 2022-06-12 DIAGNOSIS — S32019A Unspecified fracture of first lumbar vertebra, initial encounter for closed fracture: Secondary | ICD-10-CM | POA: Diagnosis not present

## 2022-06-12 DIAGNOSIS — I2510 Atherosclerotic heart disease of native coronary artery without angina pectoris: Secondary | ICD-10-CM | POA: Diagnosis not present

## 2022-06-12 LAB — GLUCOSE, CAPILLARY
Glucose-Capillary: 135 mg/dL — ABNORMAL HIGH (ref 70–99)
Glucose-Capillary: 190 mg/dL — ABNORMAL HIGH (ref 70–99)
Glucose-Capillary: 224 mg/dL — ABNORMAL HIGH (ref 70–99)
Glucose-Capillary: 134 mg/dL — ABNORMAL HIGH (ref 70–99)

## 2022-06-12 NOTE — Progress Notes (Signed)
Occupational Therapy Treatment Patient Details Name: Lisa Maxwell MRN: [id number] DOB: 04-08-1945 Today's Date: 06/12/2022   History of present illness 77 y.o. female who was admitted 06/09/22 for L foot pain and weakness with hx of recent fall. Pt found to have L1 fracture. PMH: A-fib on Eliquis, HTN, COPD, diabetes, HLD, obesity, history of pulmonary embolism on Eliquis, CAD, history of cauda equina syndrome bilateral low back pain with sciatica, CKD, chronic pain syndrome PVD.   OT comments  Patient received in supine in bed and required mod assist and frequent cues to get to EOB. Patient was total assist to donn back brace seated on EOB. Patient was mod assist to stand and to step pivot to recliner with assistance to guide hips and to avoid twisting. Grooming performed seated in recliner. Patient would benefit from continued OT treatment to increase independence and safety with functional transfers and self care.    Recommendations for follow up therapy are one component of a multi-disciplinary discharge planning process, led by the attending physician.  Recommendations may be updated based on patient status, additional functional criteria and insurance authorization.    Follow Up Recommendations  Skilled nursing-short term rehab (<3 hours/day) (may have [facility] if patient family can provide 24/7 assist)     Assistance Recommended at Discharge Frequent or constant Supervision/Assistance  Patient can return home with the following  Two people to help with walking and/or transfers;Two people to help with bathing/dressing/bathroom;Assist for transportation;Help with stairs or ramp for entrance;Assistance with cooking/housework   Equipment Recommendations  Other (comment) (continue to assess)    Recommendations for Other Services      Precautions / Restrictions Precautions Precautions: Fall;Back;Other (comment) Precaution Comments: TLSO brace - may remove to shower; R foot drop - AFO in  room Restrictions Weight Bearing Restrictions: No       Mobility Bed Mobility Overal bed mobility: Needs Assistance Bed Mobility: Rolling, Sidelying to Sit Rolling: Min assist Sidelying to sit: Mod assist       General bed mobility comments: frequent cues to maintain back precautions and for log rolling    Transfers Overall transfer level: Needs assistance Equipment used: Rolling walker (2 wheels) Transfers: Sit to/from Stand, Bed to chair/wheelchair/BSC Sit to Stand: Mod assist     Step pivot transfers: Mod assist     General transfer comment: mod assist with increased time and cues for hand placement and to avoid twisting     Balance Overall balance assessment: Needs assistance Sitting-balance support: Single extremity supported, Bilateral upper extremity supported, Feet supported Sitting balance-Leahy Scale: Poor Sitting balance - Comments: UE support for balance   Standing balance support: Bilateral upper extremity supported, During functional activity, Reliant on assistive device for balance Standing balance-Leahy Scale: Poor Standing balance comment: reliant on RW for support                           ADL either performed or assessed with clinical judgement   ADL Overall ADL's : Needs assistance/impaired     Grooming: Wash/dry hands;Wash/dry face;Brushing hair;Minimal assistance;Sitting Grooming Details (indicate cue type and reason): in recliner                               General ADL Comments: patient limited by pain and weakness    Extremity/Trunk Assessment              Vision  Perception     Praxis      Cognition Arousal/Alertness: Awake/alert Behavior During Therapy: WFL for tasks assessed/performed Overall Cognitive Status: Impaired/Different from baseline Area of Impairment: Attention, Following commands, Awareness, Problem solving, Memory                   Current Attention Level:  Selective Memory: Decreased recall of precautions Following Commands: Follows one step commands consistently, Follows one step commands with increased time, Follows multi-step commands inconsistently   Awareness: Emergent Problem Solving: Slow processing, Difficulty sequencing, Requires verbal cues, Requires tactile cues General Comments: cues for back precautions and log rolling technique        Exercises      Shoulder Instructions       General Comments      Pertinent Vitals/ Pain       Pain Assessment Pain Assessment: Faces Faces Pain Scale: Hurts even more Pain Location: low back Pain Descriptors / Indicators: Discomfort, Grimacing, Guarding Pain Intervention(s): Limited activity within patient's tolerance, Monitored during session, Patient requesting pain meds-RN notified, Repositioned  Home Living                                          Prior Functioning/Environment              Frequency  Min 2X/week        Progress Toward Goals  OT Goals(current goals can now be found in the care plan section)  Progress towards OT goals: Progressing toward goals  Acute Rehab OT Goals Patient Stated Goal: get better OT Goal Formulation: With patient Time For Goal Achievement: 06/24/22 Potential to Achieve Goals: Good ADL Goals Pt Will Perform Grooming: with set-up;with modified independence;sitting Pt Will Perform Lower Body Dressing: with caregiver independent in assisting;with set-up;sitting/lateral leans;sit to/from stand;bed level;with min assist Pt Will Transfer to Toilet: with supervision;stand pivot transfer;with transfer board;anterior/posterior transfer;bedside commode;with min guard assist Pt Will Perform Toileting - Clothing Manipulation and hygiene: with min guard assist;with min assist;with caregiver independent in assisting;sitting/lateral leans Additional ADL Goal #1: Pt will be Mod I bed mobility, adhering to back precautions and  applying TLSO in preparation for increased participation in ADL's  Plan Discharge plan remains appropriate    Co-evaluation                 AM-PAC OT "6 Clicks" Daily Activity     Outcome Measure   Help from another person eating meals?: None Help from another person taking care of personal grooming?: A Little Help from another person toileting, which includes using toliet, bedpan, or urinal?: A Lot Help from another person bathing (including washing, rinsing, drying)?: A Lot Help from another person to put on and taking off regular upper body clothing?: A Little Help from another person to put on and taking off regular lower body clothing?: Total 6 Click Score: 15    End of Session Equipment Utilized During Treatment: Gait belt;Rolling walker (2 wheels)  OT Visit Diagnosis: Other abnormalities of gait and mobility (R26.89);History of falling (Z91.81);Pain Pain - part of body:  (back)   Activity Tolerance Patient tolerated treatment well;Patient limited by pain   Patient Left in chair;with call bell/phone within reach;with family/visitor present   Nurse Communication Mobility status;Patient requests pain meds        Time: [id number] OT Time Calculation (min): 32 min  Charges: OT General Charges $OT  Visit: 1 Visit OT Treatments $Self Care/Home Management : 8-22 mins $Therapeutic Activity: 8-22 mins  Lodema Hong, OTA Acute Rehabilitation Services  Office [phone]   Trixie Dredge 06/12/2022, 12:56 PM

## 2022-06-12 NOTE — NC FL2 (Signed)
[place] LEVEL OF CARE FORM     IDENTIFICATION  Patient Name: Lisa Maxwell Birthdate: 1945/05/20 Sex: female Admission Date (Current Location): 06/09/2022  [facility] and [place] Number:  [employer] and Address:  [facility], [place] [address] [place],  [address]      Provider Number: [id number]  Attending Physician Name and Address:  Flora Lipps, MD  Relative Name and Phone Number:       Current Level of Care: Hospital Recommended Level of Care: [place] Prior Approval Number:    Date Approved/Denied:   PASRR Number: [id number]  Discharge Plan: SNF    Current Diagnoses: Patient Active Problem List   Diagnosis Date Noted   L1 vertebral fracture ([place]) 06/09/2022   Esophageal abnormality 06/09/2022   History of pulmonary embolus (PE) 05/28/2022   Tinea unguium 02/03/2022   History of pulmonary embolism 11/28/2021   Pulmonary embolism ([place]) 11/21/2021   Pressure injury of skin 11/21/2021   Cauda equina compression ([place]) 08/28/2021   Class 1 obesity due to excess calories with serious comorbidity and body mass index (BMI) of 34.0 to 34.9 in adult 05/20/2021   Dyshidrotic eczema 04/29/2021   Comprehensive diabetic foot examination, type 2 DM, encounter for ([place]) 04/29/2021   Chronic, continuous use of opioids 03/04/2021   Diabetes ([place]) 11/08/2020   Hypertension 11/08/2020   Muscle pain 04/29/2020   Cancer (HCC)    Cerebrovascular disease    Chronic kidney disease    Dysrhythmia    Heart murmur    Osteoarthritis of left knee    OSA (obstructive sleep apnea)    S/P insertion of spinal cord stimulator 07/13/2019   Preop cardiovascular exam 05/01/2019   Pain management contract agreement 04/13/2019   Spondylolisthesis 03/20/2019   Lumbar spondylosis 03/20/2019   Spondylosis of lumbosacral region without myelopathy or radiculopathy 01/30/2019   DDD (degenerative disc disease), lumbar 11/17/2018    Facet joint disease 11/17/2018   History of knee replacement, total, bilateral 11/17/2018   Type 2 diabetes mellitus ([place]) 09/28/2018   Hypertensive disorder 09/28/2018   Chronic obstructive lung disease ([place]) 09/28/2018   Increased body mass index 09/28/2018   Smoker 09/28/2018   Chronic renal failure 09/28/2018   Peripheral vascular disease ([place]) 09/28/2018   Chronic atrial fibrillation ([place]) 09/28/2018   Chronic bilateral low back pain without sciatica 03/08/2018   Chronic pain syndrome 03/08/2018   Facet arthritis of lumbar region 03/08/2018   Foraminal stenosis of lumbar region 03/08/2018   Lumbar radiculopathy 03/08/2018   Spinal stenosis of lumbar region with neurogenic claudication 03/08/2018   CAD (coronary artery disease) 11/04/2016   Essential hypertension 11/04/2016   Diabetes mellitus due to underlying condition with unspecified complications ([place]) [id number]   Mixed dyslipidemia 11/04/2016   Morbid obesity ([place]) 11/04/2016   PAF (paroxysmal atrial fibrillation) ([place]) 04/30/2015   Renal insufficiency 04/30/2015   CKD (chronic kidney disease) stage 3, GFR 30-59 ml/min (HCC)     Orientation RESPIRATION BLADDER Height & Weight     Self, Time  Normal Incontinent Weight: 157 lb (71.2 kg) Height:  '4\' 8"'$  (142.2 cm)  BEHAVIORAL SYMPTOMS/MOOD NEUROLOGICAL BOWEL NUTRITION STATUS      Continent Diet (carb modified)  AMBULATORY STATUS COMMUNICATION OF NEEDS Skin   Extensive Assist Verbally Normal                       Personal Care Assistance Level of Assistance  Bathing, Feeding, Dressing  Bathing Assistance: Maximum assistance Feeding assistance: Limited assistance Dressing Assistance: Maximum assistance     Functional Limitations Info             SPECIAL CARE FACTORS FREQUENCY  PT (By licensed PT), OT (By licensed OT)     PT Frequency: 5x/wk OT Frequency: 5x/wk            Contractures Contractures Info: Not present    Additional Factors Info   Code Status, Allergies, Insulin Sliding Scale Code Status Info: Full Allergies Info: NKA   Insulin Sliding Scale Info: see DC summary       Current Medications (06/12/2022):  This is the current hospital active medication list Current Facility-Administered Medications  Medication Dose Route Frequency Provider Last Rate Last Admin   0.9 %  sodium chloride infusion  250 mL Intravenous PRN Doutova, Anastassia, MD       acetaminophen (TYLENOL) tablet 650 mg  650 mg Oral Q6H PRN Doutova, Anastassia, MD       Or   acetaminophen (TYLENOL) suppository 650 mg  650 mg Rectal Q6H PRN Doutova, Anastassia, MD       albuterol (PROVENTIL) (2.5 MG/3ML) 0.083% nebulizer solution 2.5 mg  2.5 mg Nebulization Q2H PRN Doutova, Anastassia, MD       apixaban (ELIQUIS) tablet 5 mg  5 mg Oral BID Doutova, Anastassia, MD   5 mg at 06/12/22 [id number]   bisacodyl (DULCOLAX) suppository 10 mg  10 mg Rectal Daily PRN Toy Baker, MD       docusate sodium (COLACE) capsule 100 mg  100 mg Oral Daily Doutova, Anastassia, MD   100 mg at 06/12/22 0929   feeding supplement (ENSURE ENLIVE / ENSURE PLUS) liquid 237 mL  237 mL Oral TID BM Pokhrel, Laxman, MD   237 mL at 06/12/22 1025   fentaNYL (SUBLIMAZE) injection 12.5-50 mcg  12.5-50 mcg Intravenous Q2H PRN Toy Baker, MD   50 mcg at 06/10/22 1115   ferrous sulfate tablet 325 mg  325 mg Oral Q breakfast Doutova, Anastassia, MD   325 mg at 06/12/22 0929   fluticasone furoate-vilanterol (BREO ELLIPTA) 100-25 MCG/ACT 1-2 puff  1-2 puff Inhalation Daily Doutova, Anastassia, MD   1 puff at 06/12/22 0830   gabapentin (NEURONTIN) capsule 600 mg  600 mg Oral QHS Doutova, Anastassia, MD   600 mg at 06/11/22 2125   HYDROcodone-acetaminophen (NORCO/VICODIN) 5-325 MG per tablet 1-2 tablet  1-2 tablet Oral Q4H PRN Toy Baker, MD   2 tablet at 06/12/22 1025   insulin aspart (novoLOG) injection 0-5 Units  0-5 Units Subcutaneous QHS Doutova, Anastassia, MD       insulin  aspart (novoLOG) injection 0-9 Units  0-9 Units Subcutaneous TID WC Doutova, Anastassia, MD   2 Units at 06/12/22 1212   latanoprost (XALATAN) 0.005 % ophthalmic solution 1 drop  1 drop Both Eyes QHS Doutova, Anastassia, MD   1 drop at 06/11/22 2138   magnesium oxide (MAG-OX) tablet 400 mg  400 mg Oral BID Pokhrel, Laxman, MD   400 mg at 06/12/22 0929   methocarbamol (ROBAXIN) tablet 250 mg  250 mg Oral QID Toy Baker, MD   250 mg at 06/12/22 [id number]   multivitamin with minerals tablet 1 tablet  1 tablet Oral Daily Pokhrel, Laxman, MD   1 tablet at 06/12/22 0929   ondansetron (ZOFRAN) tablet 4 mg  4 mg Oral Q6H PRN Toy Baker, MD       Or   ondansetron (ZOFRAN) injection 4 mg  4 mg Intravenous Q6H PRN Doutova, Anastassia, MD       pantoprazole (PROTONIX) EC tablet 40 mg  40 mg Oral Daily Doutova, Anastassia, MD   40 mg at 06/12/22 0929   polyethylene glycol (MIRALAX / GLYCOLAX) packet 17 g  17 g Oral Daily PRN Doutova, Anastassia, MD       pravastatin (PRAVACHOL) tablet 40 mg  40 mg Oral q1800 Doutova, Anastassia, MD   40 mg at 06/11/22 1713   sodium chloride flush (NS) 0.9 % injection 3 mL  3 mL Intravenous Q12H Doutova, Anastassia, MD   3 mL at 06/12/22 0931   sodium chloride flush (NS) 0.9 % injection 3 mL  3 mL Intravenous PRN Toy Baker, MD   3 mL at 06/12/22 0931     Discharge Medications: Please see discharge summary for a list of discharge medications.  Relevant Imaging Results:  Relevant Lab Results:   Additional Information SS#: [id number]  Geralynn Ochs, LCSW

## 2022-06-12 NOTE — Progress Notes (Signed)
Physical Therapy Treatment Patient Details Name: Lisa Maxwell MRN: [id number] DOB: [birth year] Today's Date: 06/12/2022   History of Present Illness 77 y.o. female who was admitted 06/09/22 for L foot pain and weakness with hx of recent fall. Pt found to have L1 fracture. PMH: A-fib on Eliquis, HTN, COPD, diabetes, HLD, obesity, history of pulmonary embolism on Eliquis, CAD, history of cauda equina syndrome bilateral low back pain with sciatica, CKD, chronic pain syndrome PVD.    PT Comments    Brought pt in recliner into hallway to focus session on gait training with a chair follow for safety. Pt has difficulty maintaining an upright posture, shifting weight to her R, advancing her L leg to step, and clearing her L foot when stepping while ambulating. Pt required extensive verbal and tactile cues along with modA physically to take a few steps each bout. On the second bout, pt suddenly began to sit prematurely and needed maxAx2 to safely guide her into the recliner. She is at high risk for falls. Encouraged OOB to chair with nursing staff and lower extremity AROM exercises as able and tolerated to increase pt's strength and endurance. Will continue to follow acutely. Current recommendations remain appropriate.    Recommendations for follow up therapy are one component of a multi-disciplinary discharge planning process, led by the attending physician.  Recommendations may be updated based on patient status, additional functional criteria and insurance authorization.  Follow Up Recommendations  Skilled nursing-short term rehab (<3 hours/day) Can patient physically be transported by private vehicle: Yes   Assistance Recommended at Discharge Frequent or constant Supervision/Assistance  Patient can return home with the following A lot of help with bathing/dressing/bathroom;Assistance with cooking/housework;Direct supervision/assist for medications management;Direct supervision/assist for financial  management;Assist for transportation;Help with stairs or ramp for entrance;Two people to help with walking and/or transfers   Equipment Recommendations  None recommended by PT (has all needed DME)    Recommendations for Other Services       Precautions / Restrictions Precautions Precautions: Fall;Back;Other (comment) Precaution Booklet Issued: No Precaution Comments: TLSO brace - may remove to shower; R foot drop - AFO in room Restrictions Weight Bearing Restrictions: No     Mobility  Bed Mobility Overal bed mobility: Needs Assistance Bed Mobility: Sit to Sidelying, Rolling Rolling: Min assist       Sit to sidelying: +2 for physical assistance, +2 for safety/equipment, Max assist General bed mobility comments: Cues for log roll technique, minA to rotate trunk. MaxAx2 to manage trunk and legs with return to supine from sitting R EOB.    Transfers Overall transfer level: Needs assistance Equipment used: Rolling walker (2 wheels) Transfers: Sit to/from Stand, Bed to chair/wheelchair/BSC Sit to Stand: Mod assist   Step pivot transfers: Mod assist, +2 safety/equipment       General transfer comment: Pt needing modA to power up to stand from recliner 3x, demonstrating a posterior bias and flexed posture. Needs cues for hand placement. ModA for stability and preventing posterior LOB with stand step pivot to R bed > recliner. +2 from RN for safety with step pivot transfer    Ambulation/Gait Ambulation/Gait assistance: Mod assist, +2 safety/equipment, Max assist, +2 physical assistance Gait Distance (Feet): 2 Feet (x3 bouts of ~2 ft each bout) Assistive device: Rolling walker (2 wheels) Gait Pattern/deviations: Step-to pattern, Decreased step length - left, Decreased stride length, Decreased dorsiflexion - right, Decreased dorsiflexion - left, Shuffle, Trunk flexed, Knee flexed in stance - right, Knee flexed in stance - left,  Decreased weight shift to right Gait velocity:  reduced Gait velocity interpretation: <1.31 ft/sec, indicative of household ambulator   General Gait Details: Brought pt out into hallway via rolling her in the recliner and NT/sitter offering chair follow. RN then took over chair follow when NT had another obligation to attend to. Pt displayed difficulty advancing her L leg, needing max cues to shift weight to her R leg and push up through the RW for more upright posture and unweighting of her L leg. Pt needing physical assistance to advance her L leg with pt attempting to initiate. Poor L foot clearance with foot drag and forefoot initial contact throughout. ModA for balance, weight shifting, and advancing L foot during first gait bout in hallway and for 3rd gait bout involving pt stepping from recliner to bed in room. During 2nd gait bout in hallway, pt began to sit prematurely with no warning and needed maxAx2 to safely get her into the recliner.   Stairs             Wheelchair Mobility    Modified Rankin (Stroke Patients Only)       Balance Overall balance assessment: Needs assistance Sitting-balance support: Single extremity supported, Bilateral upper extremity supported, Feet supported Sitting balance-Leahy Scale: Poor Sitting balance - Comments: Pt relying on UE support, min guard-minA to sit statically EOB. Postural control: Posterior lean Standing balance support: Bilateral upper extremity supported, During functional activity, Reliant on assistive device for balance Standing balance-Leahy Scale: Poor Standing balance comment: Reliant on RW and external physical assistance                            Cognition Arousal/Alertness: Awake/alert Behavior During Therapy: WFL for tasks assessed/performed Overall Cognitive Status: Impaired/Different from baseline Area of Impairment: Attention, Following commands, Awareness, Problem solving, Memory                   Current Attention Level: Selective Memory:  Decreased recall of precautions Following Commands: Follows one step commands consistently, Follows one step commands with increased time, Follows multi-step commands inconsistently   Awareness: Emergent Problem Solving: Slow processing, Difficulty sequencing, Requires verbal cues, Requires tactile cues, Decreased initiation General Comments: Pt needing multi-modal cues and extra time to sequence all mobility and reminders to maintain spinal precautions.        Exercises      General Comments General comments (skin integrity, edema, etc.): encouraged pt OOB to chair with nursing over weekend and perform lower extremity AROM exercises as tolerated in bed or sitting as HEP      Pertinent Vitals/Pain Pain Assessment Pain Assessment: Faces Faces Pain Scale: Hurts even more Pain Location: low back Pain Descriptors / Indicators: Discomfort, Grimacing, Guarding Pain Intervention(s): Limited activity within patient's tolerance, Monitored during session, Repositioned    Home Living                          Prior Function            PT Goals (current goals can now be found in the care plan section) Acute Rehab PT Goals Patient Stated Goal: to improve PT Goal Formulation: With patient/family Time For Goal Achievement: 06/25/22 Potential to Achieve Goals: Good Progress towards PT goals: Progressing toward goals    Frequency    Min 3X/week      PT Plan Current plan remains appropriate    Co-evaluation  AM-PAC PT "6 Clicks" Mobility   Outcome Measure  Help needed turning from your back to your side while in a flat bed without using bedrails?: A Little Help needed moving from lying on your back to sitting on the side of a flat bed without using bedrails?: A Lot Help needed moving to and from a bed to a chair (including a wheelchair)?: A Lot Help needed standing up from a chair using your arms (e.g., wheelchair or bedside chair)?: A Lot Help  needed to walk in hospital room?: Total Help needed climbing 3-5 steps with a railing? : Total 6 Click Score: 11    End of Session Equipment Utilized During Treatment: Gait belt;Back brace Activity Tolerance: Patient tolerated treatment well;Patient limited by fatigue Patient left: with call bell/phone within reach;with family/visitor present;in bed;with bed alarm set;with nursing/sitter in room Nurse Communication: Mobility status PT Visit Diagnosis: Unsteadiness on feet (R26.81);Other abnormalities of gait and mobility (R26.89);Muscle weakness (generalized) (M62.81);History of falling (Z91.81);Difficulty in walking, not elsewhere classified (R26.2);Pain Pain - part of body:  (back)     Time: [id number] PT Time Calculation (min) (ACUTE ONLY): 34 min  Charges:  $Gait Training: 8-22 mins $Therapeutic Activity: 8-22 mins                     Moishe Spice, PT, DPT Acute Rehabilitation Services  Office: [place] 06/12/2022, 2:30 PM

## 2022-06-12 NOTE — Progress Notes (Signed)
Pt unable to wear CPAP due to confusion

## 2022-06-12 NOTE — TOC Initial Note (Signed)
Transition of Care [facility]) - Initial/Assessment Note    Patient Details  Name: Lisa Maxwell MRN: [id number] Date of Birth: [birth year]  Transition of Care [facility]) CM/SW Contact:    Geralynn Ochs, LCSW Phone Number: 06/12/2022, 2:00 PM  Clinical Narrative:       CSW spoke with patient's spouse, Pieter Partridge, to discuss SNF recommendation. [place] in agreement, preference for closer to home if possible but ultimately wants her in the best place possible. CSW faxed out referral, will follow with bed offers.            Expected Discharge Plan: Skilled Nursing Facility Barriers to Discharge: Continued Medical Work up, [facility] will not accept until restraint criteria met   Patient Goals and CMS Choice Patient states their goals for this hospitalization and ongoing recovery are:: patient unable to participate in goal setting, not fully oriented CMS Medicare.gov Compare Post Acute Care list provided to:: Patient Represenative (must comment) Choice offered to / list presented to : [place] ownership interest in [facility].provided to:: Spouse    Expected Discharge Plan and Services     Post Acute Care Choice: [place] Living arrangements for the past 2 months: Single Family Home                                      Prior Living Arrangements/Services Living arrangements for the past 2 months: Single Family Home Lives with:: Spouse Patient language and need for interpreter reviewed:: No Do you feel safe going back to the place where you live?: Yes      Need for Family Participation in Patient Care: Yes (Comment) Care giver support system in place?: No (comment)   Criminal Activity/Legal Involvement Pertinent to Current Situation/Hospitalization: No - Comment as needed  Activities of Daily Living Home Assistive Devices/Equipment: Wheelchair, Environmental consultant (specify type) ADL Screening (condition at time of admission) Patient's  cognitive ability adequate to safely complete daily activities?: Yes Is the patient deaf or have difficulty hearing?: Yes Does the patient have difficulty seeing, even when wearing glasses/contacts?: No Does the patient have difficulty concentrating, remembering, or making decisions?: No Patient able to express need for assistance with ADLs?: Yes Does the patient have difficulty dressing or bathing?: Yes Independently performs ADLs?: Yes (appropriate for developmental age) Does the patient have difficulty walking or climbing stairs?: Yes Weakness of Legs: Both Weakness of Arms/Hands: None  Permission Sought/Granted Permission sought to share information with : Facility Sport and exercise psychologist, Family Supports Permission granted to share information with : Yes, Verbal Permission Granted  Share Information with NAME: Pieter Partridge  Permission granted to share info w AGENCY: SNF  Permission granted to share info w Relationship: Spouse     Emotional Assessment   Attitude/Demeanor/Rapport: Unable to Assess Affect (typically observed): Unable to Assess Orientation: : Oriented to Self, Oriented to  Time Alcohol / Substance Use: Not Applicable Psych Involvement: No (comment)  Admission diagnosis:  Weakness [R53.1] L1 vertebral fracture (HCC) [S32.019A] Closed fracture of first lumbar vertebra with nonunion, unspecified fracture morphology, subsequent encounter [S32.019K] Patient Active Problem List   Diagnosis Date Noted   L1 vertebral fracture ([place]) 06/09/2022   Esophageal abnormality 06/09/2022   History of pulmonary embolus (PE) 05/28/2022   Tinea unguium 02/03/2022   History of pulmonary embolism 11/28/2021   Pulmonary embolism ([place]) 11/21/2021   Pressure injury of skin 11/21/2021   Cauda equina compression ([place])  08/28/2021   Class 1 obesity due to excess calories with serious comorbidity and body mass index (BMI) of 34.0 to 34.9 in adult 05/20/2021   Dyshidrotic eczema 04/29/2021    Comprehensive diabetic foot examination, type 2 DM, encounter for ([place]) 04/29/2021   Chronic, continuous use of opioids 03/04/2021   Diabetes ([place]) 11/08/2020   Hypertension 11/08/2020   Muscle pain 04/29/2020   Cancer ([place])    Cerebrovascular disease    Chronic kidney disease    Dysrhythmia    Heart murmur    Osteoarthritis of left knee    OSA (obstructive sleep apnea)    S/P insertion of spinal cord stimulator 07/13/2019   Preop cardiovascular exam 05/01/2019   Pain management contract agreement 04/13/2019   Spondylolisthesis 03/20/2019   Lumbar spondylosis 03/20/2019   Spondylosis of lumbosacral region without myelopathy or radiculopathy 01/30/2019   DDD (degenerative disc disease), lumbar 11/17/2018   Facet joint disease 11/17/2018   History of knee replacement, total, bilateral 11/17/2018   Type 2 diabetes mellitus ([place]) 09/28/2018   Hypertensive disorder 09/28/2018   Chronic obstructive lung disease ([place]) 09/28/2018   Increased body mass index 09/28/2018   Smoker 09/28/2018   Chronic renal failure 09/28/2018   Peripheral vascular disease ([place]) 09/28/2018   Chronic atrial fibrillation ([place]) 09/28/2018   Chronic bilateral low back pain without sciatica 03/08/2018   Chronic pain syndrome 03/08/2018   Facet arthritis of lumbar region 03/08/2018   Foraminal stenosis of lumbar region 03/08/2018   Lumbar radiculopathy 03/08/2018   Spinal stenosis of lumbar region with neurogenic claudication 03/08/2018   CAD (coronary artery disease) 11/04/2016   Essential hypertension 11/04/2016   Diabetes mellitus due to underlying condition with unspecified complications ([place]) [id number]   Mixed dyslipidemia 11/04/2016   Morbid obesity ([place]) 11/04/2016   PAF (paroxysmal atrial fibrillation) ([place]) 04/30/2015   Renal insufficiency 04/30/2015   CKD (chronic kidney disease) stage 3, GFR 30-59 ml/min (HCC)    PCP:  Nicoletta Dress, MD Pharmacy:   [id number] [place], [place] - [address] [place] [address]Phone: [phone]: [phone] [place] [place]Phone: [phone]: [phone]    Social Determinants of Health (SDOH) Social History: SDOH Screenings   Food Insecurity: No Food Insecurity (06/10/2022)  Housing: Low Risk  (06/10/2022)  Transportation Needs: No Transportation Needs (06/10/2022)  Utilities: Not At Risk (06/10/2022)  Tobacco Use: Medium Risk (06/10/2022)   SDOH Interventions:     Readmission Risk Interventions     No data to display

## 2022-06-12 NOTE — Progress Notes (Signed)
PROGRESS NOTE    Lisa Maxwell  [id number] DOB: 1945-05-21 DOA: 06/09/2022 PCP: Nicoletta Dress, MD    Brief Narrative:  Lisa Maxwell is a 77 y.o. female with past medical history of atrial fibrillation on Eliquis, hypertension, COPD, diabetes mellitus type 2, hyperlipidemia, obesity, history of pulmonary embolism on Eliquis, CAD, history of cortrak tube and syndrome status post low back pain and sciatica, chronic pain syndrome and peripheral vascular disease presented to hospital with left foot pain and weakness.  Patient already has right foot weakness and is on a brace for at least a week now but now both legs were hurting.  Patient did have a fall a week back when she slumped against the wall and slid down and started having severe foot pain on the left.  She uses walker at baseline but has been wheelchair-bound at this time.   In the ED, patient had mild tachycardia.  She did have mild leukocytosis at 11.7.  Chemistry showed creatinine at 1.4.  CT scan of the lumbar spine showed L1 inferior endplate fracture, CT head was nonacute.  Chest x-ray was negative.  She was given IV Dilaudid, neurosurgery was consulted and patient was admitted hospital for further evaluation and treatment.  Assessment and plan.  L1 inferior endplate fracture with back and foot pain with ambulatory dysfunction.  Has been more wheelchair-bound at this time.  Continue analgesics.  Physical therapy evaluation.  Neurosurgery  recommend putting the patient in TSL0 brace and pain management.  Neurosurgery has seen the patient and recommend conservative treatment with outpatient neurosurgery follow-up.  Physical therapy has recommended skilled nursing facility placement on discharge. . Confusion agitation during the nighttime.  Needed Haldol and sitter.  Currently more alert awake and Communicative.  Likely sundowning.  Continue delirium precautions.  CKD (chronic kidney disease) stage 3b Continue to monitor BMP.   Latest creatinine at 1.1.  Mild hypomagnesemia.  Has been replenished.  Continue magnesium oxide.  Check levels in AM.   CAD (coronary artery disease) Continue Mevacor, patient is on aspirin statin beta-blocker and Plavix as outpatient.   Essential hypertension Blood pressure was low on presentation.  Blood pressure improved at this time.   Diabetes mellitus due to underlying condition with unspecified complications Continue sliding scale insulin Accu-Cheks diabetic diet.  Hold oral medications.  Latest POC glucose of 135.   Mixed dyslipidemia Continue Mevacor    Morbid obesity  Body mass index is 35.2 kg/m.  Would benefit from weight loss as outpatient.   PAF (paroxysmal atrial fibrillation) (HCC) CHA2DS2-VASc score of 8.  Continue Eliquis.  Controlled.  Patient is not taking beta-blocker.   Chronic obstructive lung disease ([place]) Appears compensated.  Continue bronchodilators.  Follow up with pulmonary as outpatient.  Not on oxygen at baseline.   History of DVT/pulmonary embolus (PE) Continue Eliquis.   Foraminal stenosis of lumbar region with chronic pain syndrome. Continue analgesia.  Continue gabapentin and Norco.   Esophageal abnormality Barium swallow examination.  OSA (obstructive sleep apnea) CPAP    DVT prophylaxis:  apixaban (ELIQUIS) tablet 5 mg   Code Status:     Code Status: Full Code  Disposition: PT OT has recommended skilled nursing facility placement at this time.  TOC has been consulted.  Status is: Inpatient  The is inpatient because: Intractable pain, ambulatory dysfunction,  need for rehabilitation.   Family Communication:  I spoke with the patient's husband on the phone and updated him about the clinical condition of the patient on  06/11/2022.  Consultants:  Neurosurgery  Procedures:  TSLO brace application  Antimicrobials:  None  Anti-infectives (From admission, onward)    None      Subjective: Today, patient was seen and  examined at bedside.  Complains of mild back pain.  Denies increasing shortness of breath chest pain nausea vomiting fever or chills.  Sitter at bedside.  Objective: Vitals:   06/11/22 2300 06/12/22 0301 06/12/22 0748 06/12/22 0833  BP: 132/89 119/87 (!) 144/72   Pulse: 81 96 75 85  Resp: '17 17 18 18  '$ Temp: 97.8 F (36.6 C) 98.2 F (36.8 C) 98.2 F (36.8 C)   TempSrc: Oral Oral    SpO2: 98% 98% 98% 98%  Weight:      Height:        Intake/Output Summary (Last 24 hours) at 06/12/2022 1012 Last data filed at 06/12/2022 0253 Gross per 24 hour  Intake --  Output 1750 ml  Net -1750 ml    Filed Weights   06/09/22 1041  Weight: 71.2 kg    Physical Examination: Body mass index is 35.2 kg/m.   General: Alert awake and Communicative, obese, HENT:   No scleral pallor or icterus noted. Oral mucosa is moist.  Patient. Chest:  Clear breath sounds.  No crackles or wheezes.  Tenderness over the lumbar spine.   CVS: S1 &S2 heard. No murmur.  Regular rate and rhythm. Abdomen: Soft, nontender, nondistended.  Bowel sounds are heard.   Extremities: No cyanosis, clubbing or bilateral lower extremity edema.  Peripheral pulses are palpable. Psych: Alert, awake and, oriented,  CNS:  No cranial nerve deficits.  Decreased strength over the lower extremity, mostly on the left side.   Skin: Warm and dry.  No rashes noted.  Data Reviewed:   CBC: Recent Labs  Lab 06/09/22 1102 06/10/22 0732 06/11/22 0714  WBC 11.7* 7.1 6.2  NEUTROABS 9.9*  --   --   HGB 15.1* 14.0 13.5  HCT 47.3* 43.2 42.2  MCV 96.7 95.2 95.5  PLT 207 188 187     Basic Metabolic Panel: Recent Labs  Lab 06/09/22 1102 06/10/22 0732 06/11/22 0714  NA 136 138 137  K 4.6 4.6 3.8  CL 103 107 107  CO2 24 19* 22  GLUCOSE 120* 120* 124*  BUN '22 16 14  '$ CREATININE 1.40* 1.31* 1.12*  CALCIUM 10.8* 10.0 10.1  MG  --  1.8 1.6*  PHOS  --  2.6  --      Liver Function Tests: Recent Labs  Lab 06/09/22 1102  06/10/22 0732  AST 35 34  ALT 25 24  ALKPHOS 65 60  BILITOT 0.6 0.6  PROT 6.7 6.1*  ALBUMIN 3.8 3.3*      Radiology Studies: DG ESOPHAGUS W SINGLE CM (SOL OR THIN BA)  Result Date: 06/10/2022 CLINICAL DATA:  78 year old female admitted with weakness. Possible esophageal abnormality noted on imaging. Barium swallow evaluation requested. EXAM: ESOPHAGUS/BARIUM SWALLOW/TABLET STUDY TECHNIQUE: Single contrast examination was performed using thin liquid barium. This exam was performed by Brynda Greathouse PA-C, and was supervised and interpreted by Claudie Revering, MD. FLUOROSCOPY: Radiation Exposure Index (as provided by the fluoroscopic device): 23.2 mGy Kerma COMPARISON:  CT Thoracic Spine 06/09/22 FINDINGS: Swallowing: Appears normal. No vestibular penetration or aspiration seen. Pharynx: Unremarkable. Esophagus: Smooth narrowing of the distal esophagus at the gastroesophageal junction without stricture. Esophageal motility: Tertiary contractions most pronounced in the mid esophagus leading to moderate dysmotility and delayed passage of barium bolus. Hiatal Hernia:  None. Gastroesophageal reflux: None visualized. Ingested 43m barium tablet: Became stuck in the distal esophagus Other: Limited exam due to patient immobility and acute illness. IMPRESSION: 1. Narrowing of the distal esophagus which does not allow passage of the 162mbarium tablet. 2.  Tertiary contractions leading to moderate dysmotility. Electronically Signed   By: StClaudie Revering.D.   On: 06/10/2022 13:53      LOS: 1 day    LaFlora LippsMD [facility] Available via Epic secure chat 7am-7pm After these hours, please refer to coverage provider listed on [email or web address] 06/12/2022, 10:12 AM

## 2022-06-13 DIAGNOSIS — J449 Chronic obstructive pulmonary disease, unspecified: Secondary | ICD-10-CM | POA: Diagnosis not present

## 2022-06-13 DIAGNOSIS — N1832 Chronic kidney disease, stage 3b: Secondary | ICD-10-CM | POA: Diagnosis not present

## 2022-06-13 DIAGNOSIS — I2510 Atherosclerotic heart disease of native coronary artery without angina pectoris: Secondary | ICD-10-CM | POA: Diagnosis not present

## 2022-06-13 DIAGNOSIS — S32019A Unspecified fracture of first lumbar vertebra, initial encounter for closed fracture: Secondary | ICD-10-CM | POA: Diagnosis not present

## 2022-06-13 LAB — GLUCOSE, CAPILLARY
Glucose-Capillary: 139 mg/dL — ABNORMAL HIGH (ref 70–99)
Glucose-Capillary: 161 mg/dL — ABNORMAL HIGH (ref 70–99)
Glucose-Capillary: 129 mg/dL — ABNORMAL HIGH (ref 70–99)
Glucose-Capillary: 99 mg/dL (ref 70–99)
Glucose-Capillary: 129 mg/dL — ABNORMAL HIGH (ref 70–99)

## 2022-06-13 LAB — BASIC METABOLIC PANEL
Anion gap: 6 (ref 5–15)
BUN: 21 mg/dL (ref 8–23)
CO2: 22 mmol/L (ref 22–32)
Calcium: 9.9 mg/dL (ref 8.9–10.3)
Chloride: 107 mmol/L (ref 98–111)
Creatinine, Ser: 1.18 mg/dL — ABNORMAL HIGH (ref 0.44–1.00)
GFR, Estimated: 48 mL/min — ABNORMAL LOW (ref >60)
Glucose, Bld: 184 mg/dL — ABNORMAL HIGH (ref 70–99)
Potassium: 4.4 mmol/L (ref 3.5–5.1)
Sodium: 135 mmol/L (ref 135–145)

## 2022-06-13 LAB — MAGNESIUM: Magnesium: 1.6 mg/dL — ABNORMAL LOW (ref 1.7–2.4)

## 2022-06-13 MED ORDER — MAGNESIUM SULFATE 2 GM/50ML IV SOLN
2.0000 g | Freq: Once | INTRAVENOUS | Status: AC
  Administered 2022-06-13: 2 g via INTRAVENOUS
  Filled 2022-06-13: qty 50

## 2022-06-13 MED ORDER — WHITE PETROLATUM EX OINT
TOPICAL_OINTMENT | CUTANEOUS | Status: DC | PRN
  Filled 2022-06-13: qty 28.35

## 2022-06-13 NOTE — Progress Notes (Signed)
Assessed pt [place] should not be yellow HR irregular due to chronic afib assessed apically

## 2022-06-13 NOTE — Progress Notes (Signed)
PROGRESS NOTE    Lisa Maxwell  [id number] DOB: 03-03-1946 DOA: 06/09/2022 PCP: Nicoletta Dress, MD    Brief Narrative:  LILYAUNA BECKERT is a 77 y.o. female with past medical history of atrial fibrillation on Eliquis, hypertension, COPD, diabetes mellitus type 2, hyperlipidemia, obesity, history of pulmonary embolism on Eliquis, CAD,  status post low back pain and sciatica, chronic pain syndrome on spinal stimulator and peripheral vascular disease presented to hospital with left foot pain and weakness.  Patient already has right foot weakness and is on a brace for at least a week now but now both legs were hurting.  Patient did have a fall a week back when she slumped against the wall and slid down and started having severe foot pain on the left.  She uses walker at baseline but has been wheelchair-bound at this time. In the ED, patient had mild tachycardia.  She did have mild leukocytosis at 11.7.  Chemistry showed creatinine at 1.4.  CT scan of the lumbar spine showed L1 inferior endplate fracture, CT head was nonacute.  Chest x-ray was negative.  She was given IV Dilaudid, neurosurgery was consulted and patient was admitted hospital for further evaluation and treatment.  Assessment and plan.  L1 inferior endplate fracture with back and foot pain with ambulatory dysfunction.   Has been more wheelchair-bound at this time.  Continue analgesics.  Physical therapy evaluation recommend skilled nursing facility.  Neurosurgery  recommend putting the patient in TSL0 brace and pain management with outpatient neurosurgery follow-up.  Physical therapy has recommended skilled nursing facility placement on discharge. . Confusion, agitation during the nighttime.  Needed Haldol and sitter.  Likely sundowning.  Appears to be mildly sleepy at this time.  Continue delirium precautions.  CKD (chronic kidney disease) stage 3b Continue to monitor BMP.  Latest creatinine at 1.1.  Mild hypomagnesemia.   Magnesium 1.6 today.  Will continue to replenish.   CAD (coronary artery disease) Continue Mevacor, patient is on aspirin, statin, beta-blocker and Plavix as outpatient.   Essential hypertension Blood pressure was low on presentation.  Blood pressure improved at this time.   Diabetes mellitus due to underlying condition with unspecified complications Continue sliding scale insulin, Accu-Cheks, diabetic diet.  Hold oral medications.  Latest POC glucose of 139.   Mixed dyslipidemia Continue Mevacor    Morbid obesity  Body mass index is 35.2 kg/m.  Would benefit from weight loss as outpatient.   PAF (paroxysmal atrial fibrillation) (HCC) CHA2DS2-VASc score of 8.  Continue Eliquis.  Controlled.  Patient is not taking beta-blocker.   Chronic obstructive lung disease ([place]) Appears compensated.  Continue bronchodilators.  Follow up with pulmonary as outpatient.  Not on oxygen at baseline.   History of DVT/pulmonary embolus (PE) Continue Eliquis.   Foraminal stenosis of lumbar region with chronic pain syndrome. Continue analgesia.  Continue gabapentin and Norco.   Esophageal abnormality Barium swallow examination showed a narrowing of the distal esophagus with tertiary contractions..  OSA (obstructive sleep apnea) CPAP    DVT prophylaxis:  apixaban (ELIQUIS) tablet 5 mg   Code Status:     Code Status: Full Code  Disposition:  PT OT has recommended skilled nursing facility placement at this time.  TOC on board.  Status is: Inpatient  The is inpatient because:  ambulatory dysfunction,  need for rehabilitation.   Family Communication:  I spoke with the patient's husband at the bedside on 06/13/2022.  Consultants:  Neurosurgery  Procedures:  TSLO brace application  Antimicrobials:  None  Anti-infectives (From admission, onward)    None      Subjective: Today, patient was seen and examined at bedside.  Was very confused and agitated in the nighttime.  Required  sedation.  Appears to be sleepy this morning.  Patient's husband at bedside.   Objective: Vitals:   06/12/22 1117 06/12/22 1510 06/12/22 2325 06/13/22 0814  BP: (!) 141/71 130/78 108/76 119/82  Pulse: 78 92 (!) 53 (!) 116  Resp: '16 18 18 18  '$ Temp: 98.2 F (36.8 C) 98.3 F (36.8 C) 98.3 F (36.8 C) 97.6 F (36.4 C)  TempSrc:   Oral Axillary  SpO2: 95% 98% 97% 98%  Weight:      Height:        Intake/Output Summary (Last 24 hours) at 06/13/2022 1122 Last data filed at 06/12/2022 2139 Gross per 24 hour  Intake 357 ml  Output 800 ml  Net -443 ml    Filed Weights   06/09/22 1041  Weight: 71.2 kg    Physical Examination: Body mass index is 35.2 kg/m.   General: Alert awake and Communicative, obese, mildly somnolent.  Elderly female. HENT:   No scleral pallor or icterus noted. Oral mucosa is moist.  Patient. Chest:  Clear breath sounds.  No crackles or wheezes.  Tenderness over the lumbar spine.   CVS: S1 &S2 heard. No murmur.  Regular rate and rhythm. Abdomen: Soft, nontender, nondistended.  Bowel sounds are heard.   Extremities: No cyanosis, clubbing or bilateral lower extremity edema.  Peripheral pulses are palpable. Psych: Alert, but mildly sleepy, CNS:  No cranial nerve deficits.  Decreased strength over the lower extremity, mostly on the left side.   Skin: Warm and dry.  No rashes noted.  Data Reviewed:   CBC: Recent Labs  Lab 06/09/22 1102 06/10/22 0732 06/11/22 0714  WBC 11.7* 7.1 6.2  NEUTROABS 9.9*  --   --   HGB 15.1* 14.0 13.5  HCT 47.3* 43.2 42.2  MCV 96.7 95.2 95.5  PLT 207 188 187     Basic Metabolic Panel: Recent Labs  Lab 06/09/22 1102 06/10/22 0732 06/11/22 0714 06/13/22 0356  NA 136 138 137 135  K 4.6 4.6 3.8 4.4  CL 103 107 107 107  CO2 24 19* 22 22  GLUCOSE 120* 120* 124* 184*  BUN '22 16 14 21  '$ CREATININE 1.40* 1.31* 1.12* 1.18*  CALCIUM 10.8* 10.0 10.1 9.9  MG  --  1.8 1.6* 1.6*  PHOS  --  2.6  --   --      Liver Function  Tests: Recent Labs  Lab 06/09/22 1102 06/10/22 0732  AST 35 34  ALT 25 24  ALKPHOS 65 60  BILITOT 0.6 0.6  PROT 6.7 6.1*  ALBUMIN 3.8 3.3*      Radiology Studies: No results found.    LOS: 2 days    Flora Lipps, MD [facility] Available via Epic secure chat 7am-7pm After these hours, please refer to coverage provider listed on [email or web address] 06/13/2022, 11:22 AM

## 2022-06-13 NOTE — Progress Notes (Signed)
Reassessed pt HR apically has hx of chronic afib HR was irregular but 97 bpm apically via stethoscope

## 2022-06-14 DIAGNOSIS — J449 Chronic obstructive pulmonary disease, unspecified: Secondary | ICD-10-CM | POA: Diagnosis not present

## 2022-06-14 DIAGNOSIS — S32019A Unspecified fracture of first lumbar vertebra, initial encounter for closed fracture: Secondary | ICD-10-CM | POA: Diagnosis not present

## 2022-06-14 DIAGNOSIS — I2510 Atherosclerotic heart disease of native coronary artery without angina pectoris: Secondary | ICD-10-CM | POA: Diagnosis not present

## 2022-06-14 DIAGNOSIS — N1832 Chronic kidney disease, stage 3b: Secondary | ICD-10-CM | POA: Diagnosis not present

## 2022-06-14 LAB — BASIC METABOLIC PANEL
Anion gap: 8 (ref 5–15)
BUN: 16 mg/dL (ref 8–23)
CO2: 24 mmol/L (ref 22–32)
Calcium: 10.2 mg/dL (ref 8.9–10.3)
Chloride: 105 mmol/L (ref 98–111)
Creatinine, Ser: 1.1 mg/dL — ABNORMAL HIGH (ref 0.44–1.00)
GFR, Estimated: 52 mL/min — ABNORMAL LOW (ref >60)
Glucose, Bld: 134 mg/dL — ABNORMAL HIGH (ref 70–99)
Potassium: 4.3 mmol/L (ref 3.5–5.1)
Sodium: 137 mmol/L (ref 135–145)

## 2022-06-14 LAB — GLUCOSE, CAPILLARY
Glucose-Capillary: 132 mg/dL — ABNORMAL HIGH (ref 70–99)
Glucose-Capillary: 210 mg/dL — ABNORMAL HIGH (ref 70–99)
Glucose-Capillary: 187 mg/dL — ABNORMAL HIGH (ref 70–99)
Glucose-Capillary: 158 mg/dL — ABNORMAL HIGH (ref 70–99)

## 2022-06-14 LAB — CBC
HCT: 42.6 % (ref 36.0–46.0)
Hemoglobin: 14 g/dL (ref 12.0–15.0)
MCH: 30.7 pg (ref 26.0–34.0)
MCHC: 32.9 g/dL (ref 30.0–36.0)
MCV: 93.4 fL (ref 80.0–100.0)
Platelets: 180 10*3/uL (ref 150–400)
RBC: 4.56 MIL/uL (ref 3.87–5.11)
RDW: 13.2 % (ref 11.5–15.5)
WBC: 6.6 10*3/uL (ref 4.0–10.5)
nRBC: 0 % (ref 0.0–0.2)

## 2022-06-14 LAB — MAGNESIUM: Magnesium: 2.2 mg/dL (ref 1.7–2.4)

## 2022-06-14 NOTE — Progress Notes (Signed)
Mobility Specialist Progress Note   06/14/22 1034  Mobility  Activity Dangled on edge of bed;Stood at bedside  Level of Assistance Moderate assist, patient does 50-74%  Assistive Device Front wheel walker  Range of Motion/Exercises Active;All extremities  Activity Response Tolerated poorly   Pre Mobility:  HR 93 +/- During Mobility: HR 144 +/- Post Mobility: HR 120 +/-  Patient received side-lying in bed, grimacing in pain. Presented A&Ox2 and less lethargic than yesterday. Despite pain and with some encouragement from family, patient agreeable to participate. Reviewed back precautions with poor carryover throughout session needing occasional cueing to adhere. Pain limited all generalized movement. Required TA to initiate bed mobility, was able to log roll with min A and needed max assistance from side-lying to sitting. Patient was able to assist with donning TLSO and needed TA to don AFO. Completed STS x4 total with mod A. On first STS, attempted to weight shift with assistance but unable to and needed seated rest secondary to pain. With each bout of STS, pain too significant to progress to taking steps, also couldn't tolerate extended time while standing. Patient did well with scooting to [facility] without assistance. Returned to supine with TA. Was left with all needs met, call bell in reach.   [place] Jeramiah Mccaughey, BS EXP Mobility Specialist Please contact via SecureChat or Rehab office at [phone]

## 2022-06-14 NOTE — Progress Notes (Signed)
PROGRESS NOTE    [name]  [id number] DOB: 28-Dec-1945 DOA: 06/09/2022 PCP: Nicoletta Dress, MD    Brief Narrative:  Lisa Maxwell is a 77 y.o. female with past medical history of atrial fibrillation on Eliquis, hypertension, COPD, diabetes mellitus type 2, hyperlipidemia, obesity, history of pulmonary embolism on Eliquis, CAD,  status post low back pain and sciatica, chronic pain syndrome on spinal stimulator and peripheral vascular disease presented to hospital with left foot pain and weakness.  Patient already has right foot weakness and is on a brace for at least a week now but now both legs were hurting.  Patient did have a fall a week back when she slumped against the wall and slid down and started having severe foot pain on the left.  She uses walker at baseline but has been wheelchair-bound at this time. In the ED, patient had mild tachycardia.  She did have mild leukocytosis at 11.7.  Chemistry showed creatinine at 1.4.  CT scan of the lumbar spine showed L1 inferior endplate fracture, CT head was nonacute.  Chest x-ray was negative.  She was given IV Dilaudid, neurosurgery was consulted and patient was admitted hospital for further evaluation and treatment.  At this time conservative treatment underway and patient is awaiting for skilled nursing facility placement.  Assessment and plan.  L1 inferior endplate fracture with back and foot pain with ambulatory dysfunction.   Has been more wheelchair-bound at this time.  Continue analgesics.  Physical therapy evaluation recommend skilled nursing facility.  Neurosurgery  recommend putting the patient in TSL0 brace and pain management with outpatient neurosurgery follow-up.  Physical therapy has recommended skilled nursing facility placement on discharge. . Confusion, agitation during the nighttime.  Needed Haldol and sitter.  Likely sundowning. .  Continue delirium precautions.  Have Discontinued fentanyl.  CKD (chronic kidney  disease) stage 3b Continue to monitor BMP.  Latest creatinine at 1.1.  Mild hypomagnesemia.  Magnesium 1.6 today.  Will continue to replenish.   CAD (coronary artery disease) Continue Mevacor, patient is on aspirin, statin, beta-blocker and Plavix as outpatient.   Essential hypertension Blood pressure was low on presentation.  Blood pressure improved at this time.   Diabetes mellitus due to underlying condition with unspecified complications Continue sliding scale insulin, Accu-Cheks, diabetic diet.  Hold oral medications.  Latest POC glucose of 132.   Mixed dyslipidemia Continue Mevacor    Morbid obesity  Body mass index is 35.2 kg/m.  Would benefit from weight loss as outpatient.   PAF (paroxysmal atrial fibrillation) (HCC) CHA2DS2-VASc score of 8.  Continue Eliquis.  Controlled.  Patient is not taking beta-blocker.   Chronic obstructive lung disease ([place]) Appears compensated.  Continue bronchodilators.  Follow up with pulmonary as outpatient.  Not on oxygen at baseline.   History of DVT/pulmonary embolus (PE) Continue Eliquis.   Foraminal stenosis of lumbar region with chronic pain syndrome. Continue analgesia.  Continue gabapentin and Norco.  Patient does have a spinal cord stimulator   Esophageal abnormality Barium swallow examination showed a narrowing of the distal esophagus with tertiary contractions..  On diabetic diet.  OSA (obstructive sleep apnea) CPAP    DVT prophylaxis:  apixaban (ELIQUIS) tablet 5 mg   Code Status:     Code Status: Full Code  Disposition:  PT OT has recommended skilled nursing facility placement at this time.  TOC on board.  Status is: Inpatient  The is inpatient because:  ambulatory dysfunction,  need for rehabilitation.   Family  Communication:  I spoke with the patient's husband at the bedside on 06/14/2022.  Consultants:  Neurosurgery  Procedures:  TSLO brace application  Antimicrobials:  None  Anti-infectives (From  admission, onward)    None      Subjective: Today, patient was seen and examined at bedside.  Appears to be little more alert but had difficulty recognizing people this morning.  Not agitated.  Patient has been at bedside.    Objective: Vitals:   06/13/22 1947 06/13/22 2317 06/14/22 0322 06/14/22 0813  BP: 123/82 (!) 110/92 129/78 (!) 149/67  Pulse: 71 93 85 80  Resp: '16 17 17 18  '$ Temp: 98 F (36.7 C) 98.5 F (36.9 C) 98.6 F (37 C) 98.1 F (36.7 C)  TempSrc: Oral Oral Oral   SpO2: 95%  96% 97%  Weight:      Height:        Intake/Output Summary (Last 24 hours) at 06/14/2022 1156 Last data filed at 06/13/2022 1953 Gross per 24 hour  Intake 120 ml  Output 700 ml  Net -580 ml    Filed Weights   06/09/22 1041  Weight: 71.2 kg    Physical Examination: Body mass index is 35.2 kg/m.   General: Alert awake and Communicative, obese,, elderly female, HENT:   No scleral pallor or icterus noted. Oral mucosa is moist.   Chest:  Clear breath sounds.  No crackles or wheezes.   CVS: S1 &S2 heard. No murmur.  Regular rate and rhythm. Abdomen: Soft, nontender, nondistended.  Bowel sounds are heard.   Extremities: No cyanosis, clubbing or bilateral lower extremity edema.  Peripheral pulses are palpable. Psych: Alert, awake but confused CNS:  No cranial nerve deficits.  Creased strength over the lower extremities. Skin: Warm and dry.  No rashes noted.  Data Reviewed:   CBC: Recent Labs  Lab 06/09/22 1102 06/10/22 0732 06/11/22 0714 06/14/22 0403  WBC 11.7* 7.1 6.2 6.6  NEUTROABS 9.9*  --   --   --   HGB 15.1* 14.0 13.5 14.0  HCT 47.3* 43.2 42.2 42.6  MCV 96.7 95.2 95.5 93.4  PLT 207 188 187 180     Basic Metabolic Panel: Recent Labs  Lab 06/09/22 1102 06/10/22 0732 06/11/22 0714 06/13/22 0356 06/14/22 0403  NA 136 138 137 135 137  K 4.6 4.6 3.8 4.4 4.3  CL 103 107 107 107 105  CO2 24 19* '22 22 24  '$ GLUCOSE 120* 120* 124* 184* 134*  BUN '22 16 14 21 16   '$ CREATININE 1.40* 1.31* 1.12* 1.18* 1.10*  CALCIUM 10.8* 10.0 10.1 9.9 10.2  MG  --  1.8 1.6* 1.6* 2.2  PHOS  --  2.6  --   --   --      Liver Function Tests: Recent Labs  Lab 06/09/22 1102 06/10/22 0732  AST 35 34  ALT 25 24  ALKPHOS 65 60  BILITOT 0.6 0.6  PROT 6.7 6.1*  ALBUMIN 3.8 3.3*      Radiology Studies: No results found.    LOS: 3 days    Flora Lipps, MD [facility] Available via Epic secure chat 7am-7pm After these hours, please refer to coverage provider listed on [email or web address] 06/14/2022, 11:56 AM

## 2022-06-15 DIAGNOSIS — S32019A Unspecified fracture of first lumbar vertebra, initial encounter for closed fracture: Secondary | ICD-10-CM | POA: Diagnosis not present

## 2022-06-15 DIAGNOSIS — I2510 Atherosclerotic heart disease of native coronary artery without angina pectoris: Secondary | ICD-10-CM | POA: Diagnosis not present

## 2022-06-15 DIAGNOSIS — J449 Chronic obstructive pulmonary disease, unspecified: Secondary | ICD-10-CM | POA: Diagnosis not present

## 2022-06-15 DIAGNOSIS — N1832 Chronic kidney disease, stage 3b: Secondary | ICD-10-CM | POA: Diagnosis not present

## 2022-06-15 LAB — GLUCOSE, CAPILLARY
Glucose-Capillary: 136 mg/dL — ABNORMAL HIGH (ref 70–99)
Glucose-Capillary: 151 mg/dL — ABNORMAL HIGH (ref 70–99)
Glucose-Capillary: 233 mg/dL — ABNORMAL HIGH (ref 70–99)
Glucose-Capillary: 93 mg/dL (ref 70–99)

## 2022-06-15 NOTE — Progress Notes (Signed)
Physical Therapy Treatment Patient Details Name: ARTICE GOUDEAU MRN: [id number] DOB: [birth year] Today's Date: 06/15/2022   History of Present Illness 77 y.o. female who was admitted 06/09/22 for L foot pain and weakness with hx of recent fall. Pt found to have L1 fracture. PMH: A-fib on Eliquis, HTN, COPD, diabetes, HLD, obesity, history of pulmonary embolism on Eliquis, CAD, history of cauda equina syndrome bilateral low back pain with sciatica, CKD, chronic pain syndrome PVD.    PT Comments    Pt drowsy upon entrance with increased time to promote arousal. Requiring up to two person mod-max assist for functional mobility. Pt performed multiple standing transfers from bed using RW. Difficulty weight shifting and pivoting feet, therefore utilized bed pad to guide hips over to the chair. Pt demonstrates generalized weakness, impaired posture and balance, decreased endurance, pain. Continue to recommend SNF for ongoing Physical Therapy.      Recommendations for follow up therapy are one component of a multi-disciplinary discharge planning process, led by the attending physician.  Recommendations may be updated based on patient status, additional functional criteria and insurance authorization.  Follow Up Recommendations  Skilled nursing-short term rehab (<3 hours/day) Can patient physically be transported by private vehicle: No   Assistance Recommended at Discharge Frequent or constant Supervision/Assistance  Patient can return home with the following Two people to help with walking and/or transfers;Two people to help with bathing/dressing/bathroom   Equipment Recommendations   (defer)    Recommendations for Other Services       Precautions / Restrictions Precautions Precautions: Fall;Back;Other (comment) Precaution Comments: TLSO brace - may remove to shower; R foot drop - AFO in room Restrictions Weight Bearing Restrictions: No     Mobility  Bed Mobility Overal bed mobility: Needs  Assistance Bed Mobility: Rolling, Sidelying to Sit Rolling: Max assist Sidelying to sit: Max assist       General bed mobility comments: Decreased initiation by pt, pt stating, "wait a minute," consistently, assist for BLE's and trunk    Transfers Overall transfer level: Needs assistance Equipment used: Rolling walker (2 wheels), 2 person hand held assist Transfers: Sit to/from Stand, Bed to chair/wheelchair/BSC Sit to Stand: Mod assist, +2 physical assistance Stand pivot transfers: Max assist, +2 physical assistance         General transfer comment: Pt stood from edge of bed multiple times with modA + 2 with flexed posture. Difficulty weight shifting and pivoting feet to transition to chair. Utilized face to face method with bed pad to pivot hips over.    Ambulation/Gait                   Stairs             Wheelchair Mobility    Modified Rankin (Stroke Patients Only)       Balance Overall balance assessment: Needs assistance Sitting-balance support: Feet supported, Single extremity supported Sitting balance-Leahy Scale: Poor     Standing balance support: Bilateral upper extremity supported, During functional activity, Reliant on assistive device for balance Standing balance-Leahy Scale: Poor Standing balance comment: Reliant on RW and external physical assistance                            Cognition Arousal/Alertness: Awake/alert Behavior During Therapy: WFL for tasks assessed/performed Overall Cognitive Status: Impaired/Different from baseline Area of Impairment: Attention, Following commands, Awareness, Problem solving, Memory  Current Attention Level: Sustained Memory: Decreased recall of precautions Following Commands: Follows one step commands consistently, Follows one step commands with increased time, Follows multi-step commands inconsistently   Awareness: Emergent Problem Solving: Slow processing,  Difficulty sequencing, Requires verbal cues, Requires tactile cues, Decreased initiation General Comments: Poor awareness of safety/deficits        Exercises General Exercises - Lower Extremity Heel Slides: Both, 10 reps, Supine    General Comments        Pertinent Vitals/Pain Pain Assessment Pain Assessment: Faces Faces Pain Scale: Hurts even more Pain Location: generalized Pain Descriptors / Indicators: Discomfort, Grimacing, Guarding Pain Intervention(s): Limited activity within patient's tolerance, Monitored during session    Home Living                          Prior Function            PT Goals (current goals can now be found in the care plan section) Acute Rehab PT Goals Patient Stated Goal: to improve Potential to Achieve Goals: Fair    Frequency    Min 3X/week      PT Plan Current plan remains appropriate    Co-evaluation              AM-PAC PT "6 Clicks" Mobility   Outcome Measure  Help needed turning from your back to your side while in a flat bed without using bedrails?: A Lot Help needed moving from lying on your back to sitting on the side of a flat bed without using bedrails?: A Lot Help needed moving to and from a bed to a chair (including a wheelchair)?: Total Help needed standing up from a chair using your arms (e.g., wheelchair or bedside chair)?: Total Help needed to walk in hospital room?: Total Help needed climbing 3-5 steps with a railing? : Total 6 Click Score: 8    End of Session Equipment Utilized During Treatment: Gait belt;Back brace;Other (comment) (R AFO) Activity Tolerance: Patient limited by fatigue Patient left: in chair;with call bell/phone within reach;with chair alarm set;with family/visitor present Nurse Communication: Mobility status PT Visit Diagnosis: Unsteadiness on feet (R26.81);Other abnormalities of gait and mobility (R26.89);Muscle weakness (generalized) (M62.81);History of falling  (Z91.81);Difficulty in walking, not elsewhere classified (R26.2);Pain     Time: [id number] PT Time Calculation (min) (ACUTE ONLY): 33 min  Charges:  $Therapeutic Activity: 23-37 mins                     Wyona Almas, PT, DPT Acute Rehabilitation Services Office [phone]    Deno Etienne 06/15/2022, 3:37 PM

## 2022-06-15 NOTE — Progress Notes (Signed)
PROGRESS NOTE    BREINDY TRIMARCO  [id number] DOB: 1946-02-20 DOA: 06/09/2022 PCP: Nicoletta Dress, MD    Brief Narrative:   NINETTE WURZEL is a 77 y.o. female with past medical history of atrial fibrillation on Eliquis, hypertension, COPD, diabetes mellitus type 2, hyperlipidemia, obesity, history of pulmonary embolism on Eliquis, CAD,  status post low back pain and sciatica, chronic pain syndrome on spinal stimulator and peripheral vascular disease presented to hospital with left foot pain and weakness.  Patient already has right foot weakness and is on a brace for at least a week now but now both legs were hurting.  Patient did have a fall a week back when she slumped against the wall and slid down and started having severe foot pain on the left.  She uses walker at baseline but has been wheelchair-bound at this time. In the ED, patient had mild tachycardia.  She did have mild leukocytosis at 11.7.  Chemistry showed creatinine at 1.4.  CT scan of the lumbar spine showed L1 inferior endplate fracture, CT head was nonacute.  Chest x-ray was negative.  She was given IV Dilaudid, neurosurgery was consulted and patient was admitted hospital for further evaluation and treatment.  At this time conservative treatment underway and patient is awaiting for skilled nursing facility placement.  Assessment and plan.  L1 inferior endplate fracture with back and foot pain with ambulatory dysfunction.   Has been more wheelchair-bound at this time.  Continue analgesics.  Physical therapy evaluation recommend skilled nursing facility.  Neurosurgery has recommended putting the patient in TSL0 brace and pain management with outpatient neurosurgery follow-up.  Physical therapy has recommended skilled nursing facility placement on discharge. . Confusion, agitation during the nighttime.  Needed Haldol and sitter.  Likely sundowning. .  Continue delirium precautions.  Have Discontinued fentanyl.  CKD (chronic kidney  disease) stage 3b Continue to monitor BMP.  Latest creatinine at 1.1.  Mild hypomagnesemia.  Replenished.  Latest magnesium level of 2.2.   CAD (coronary artery disease) Continue Mevacor, patient is on aspirin, statin, beta-blocker and Plavix as outpatient.   Essential hypertension Improved blood pressure.   Diabetes mellitus due to underlying condition with unspecified complications Continue sliding scale insulin, Accu-Cheks, diabetic diet.  Hold oral medications.  Latest POC glucose of 233.   Mixed dyslipidemia Continue Mevacor    Morbid obesity  Body mass index is 35.2 kg/m.  Would benefit from weight loss as outpatient.   PAF (paroxysmal atrial fibrillation) (HCC) CHA2DS2-VASc score of 8.  Continue Eliquis.  Controlled.  Patient is not taking beta-blocker.   Chronic obstructive lung disease ([place]) Appears compensated.  Continue bronchodilators.  Follow up with pulmonary as outpatient.  Not on oxygen at baseline.   History of DVT/pulmonary embolus (PE) Continue Eliquis.   Foraminal stenosis of lumbar region with chronic pain syndrome. Continue analgesia.  Continue gabapentin and Norco.  Patient does have a spinal cord stimulator   Esophageal abnormality Barium swallow examination showed a narrowing of the distal esophagus with tertiary contractions..  On diabetic diet.  OSA (obstructive sleep apnea) CPAP    DVT prophylaxis:  apixaban (ELIQUIS) tablet 5 mg   Code Status:     Code Status: Full Code  Disposition:  PT OT has recommended skilled nursing facility placement at this time.  TOC on board.  Status is: Inpatient  The is inpatient because:  ambulatory dysfunction,  need for rehabilitation.  Medically stable for disposition.   Family Communication:  I spoke with  the patient's husband at the bedside on 06/14/2022.  Consultants:  Neurosurgery  Procedures:  TSLO brace application  Antimicrobials:  None  Anti-infectives (From admission, onward)     None      Subjective: Today, patient was seen and examined at bedside.  Overall feels okay.  Denies much pain.  Denies any nausea vomiting abdominal pain.  Objective: Vitals:   06/15/22 0348 06/15/22 0744 06/15/22 0915 06/15/22 1135  BP: 134/76 (!) 123/100  127/86  Pulse: 98 83  82  Resp: '18 18  18  '$ Temp: 97.7 F (36.5 C) 97.8 F (36.6 C)  97.7 F (36.5 C)  TempSrc: Axillary Oral  Oral  SpO2: 98% 97% 96% 96%  Weight:      Height:        Intake/Output Summary (Last 24 hours) at 06/15/2022 1316 Last data filed at 06/15/2022 0800 Gross per 24 hour  Intake 243 ml  Output --  Net 243 ml    Filed Weights   06/09/22 1041  Weight: 71.2 kg    Physical Examination: Body mass index is 35.2 kg/m.   General: Alert awake Communicative, obese, elderly female HENT:   No scleral pallor or icterus noted. Oral mucosa is moist.   Chest:  Clear breath sounds.  No crackles or wheezes.   CVS: S1 &S2 heard. No murmur.  Regular rate and rhythm. Abdomen: Soft, nontender, nondistended.  Bowel sounds are heard.   Extremities: No cyanosis, clubbing with bilateral lower extremity edema.  Peripheral pulses are palpable. Psych: Alert, awake but confused CNS:  No cranial nerve deficits.  Decreased strength over the lower extremities. Skin: Warm and dry.  No rashes noted.  Data Reviewed:   CBC: Recent Labs  Lab 06/09/22 1102 06/10/22 0732 06/11/22 0714 06/14/22 0403  WBC 11.7* 7.1 6.2 6.6  NEUTROABS 9.9*  --   --   --   HGB 15.1* 14.0 13.5 14.0  HCT 47.3* 43.2 42.2 42.6  MCV 96.7 95.2 95.5 93.4  PLT 207 188 187 180     Basic Metabolic Panel: Recent Labs  Lab 06/09/22 1102 06/10/22 0732 06/11/22 0714 06/13/22 0356 06/14/22 0403  NA 136 138 137 135 137  K 4.6 4.6 3.8 4.4 4.3  CL 103 107 107 107 105  CO2 24 19* '22 22 24  '$ GLUCOSE 120* 120* 124* 184* 134*  BUN '22 16 14 21 16  '$ CREATININE 1.40* 1.31* 1.12* 1.18* 1.10*  CALCIUM 10.8* 10.0 10.1 9.9 10.2  MG  --  1.8 1.6* 1.6*  2.2  PHOS  --  2.6  --   --   --      Liver Function Tests: Recent Labs  Lab 06/09/22 1102 06/10/22 0732  AST 35 34  ALT 25 24  ALKPHOS 65 60  BILITOT 0.6 0.6  PROT 6.7 6.1*  ALBUMIN 3.8 3.3*      Radiology Studies: No results found.    LOS: 4 days    Flora Lipps, MD [facility] Available via Epic secure chat 7am-7pm After these hours, please refer to coverage provider listed on [email or web address] 06/15/2022, 1:16 PM

## 2022-06-15 NOTE — TOC Progression Note (Signed)
Transition of Care [facility]) - Progression Note    Patient Details  Name: NOLLIE LUJAN MRN: [id number] Date of Birth: 09/20/1945  Transition of Care [facility]) CM/SW [place], [place] Phone Number: 06/15/2022, 3:04 PM  Clinical Narrative:   CSW met with husband at bedside to provide bed offers for SNF. Husband asked for time to review and discuss with his daughter. Husband called CSW back and asked about [place], as that was still pending. CSW contacted [facility], they do not have any beds available until Thursday at the earliest. Husband asking for CSW to meet with him and daughter at bedside tomorrow to discuss options, CSW to meet with family around 10 am tomorrow. CSW to follow.    Expected Discharge Plan: Skilled Nursing Facility Barriers to Discharge: Continued Medical Work up, [place] will not accept until restraint criteria met  Expected Discharge Plan and Services     Post Acute Care Choice: [place] Living arrangements for the past 2 months: Single Family Home                                       Social Determinants of Health (SDOH) Interventions SDOH Screenings   Food Insecurity: No Food Insecurity (06/10/2022)  Housing: Low Risk  (06/10/2022)  Transportation Needs: No Transportation Needs (06/10/2022)  Utilities: Not At Risk (06/10/2022)  Tobacco Use: Medium Risk (06/10/2022)    Readmission Risk Interventions     No data to display

## 2022-06-16 DIAGNOSIS — R933 Abnormal findings on diagnostic imaging of other parts of digestive tract: Secondary | ICD-10-CM | POA: Diagnosis not present

## 2022-06-16 DIAGNOSIS — J449 Chronic obstructive pulmonary disease, unspecified: Secondary | ICD-10-CM | POA: Diagnosis not present

## 2022-06-16 DIAGNOSIS — Z7901 Long term (current) use of anticoagulants: Secondary | ICD-10-CM | POA: Diagnosis not present

## 2022-06-16 DIAGNOSIS — N1832 Chronic kidney disease, stage 3b: Secondary | ICD-10-CM | POA: Diagnosis not present

## 2022-06-16 DIAGNOSIS — I2510 Atherosclerotic heart disease of native coronary artery without angina pectoris: Secondary | ICD-10-CM | POA: Diagnosis not present

## 2022-06-16 DIAGNOSIS — S32019A Unspecified fracture of first lumbar vertebra, initial encounter for closed fracture: Secondary | ICD-10-CM | POA: Diagnosis not present

## 2022-06-16 DIAGNOSIS — K222 Esophageal obstruction: Secondary | ICD-10-CM

## 2022-06-16 DIAGNOSIS — R1319 Other dysphagia: Secondary | ICD-10-CM | POA: Diagnosis not present

## 2022-06-16 LAB — GLUCOSE, CAPILLARY
Glucose-Capillary: 144 mg/dL — ABNORMAL HIGH (ref 70–99)
Glucose-Capillary: 147 mg/dL — ABNORMAL HIGH (ref 70–99)
Glucose-Capillary: 160 mg/dL — ABNORMAL HIGH (ref 70–99)
Glucose-Capillary: 127 mg/dL — ABNORMAL HIGH (ref 70–99)

## 2022-06-16 MED ORDER — HYDROCODONE-ACETAMINOPHEN 5-325 MG PO TABS
1.0000 | ORAL_TABLET | ORAL | Status: DC | PRN
  Administered 2022-06-17 – 2022-06-19 (×5): 1 via ORAL
  Filled 2022-06-16 (×5): qty 1

## 2022-06-16 NOTE — Care Management Important Message (Signed)
Important Message  Patient Details  Name: Lisa Maxwell MRN: [id number] Date of Birth: Jan 19, 1946   Medicare Important Message Given:  Yes     Orbie Pyo 06/16/2022, 1:54 PM

## 2022-06-16 NOTE — Evaluation (Signed)
Clinical/Bedside Swallow Evaluation Patient Details  Name: Lisa Maxwell MRN: [id number] Date of Birth: 01/15/1946  Today's Date: 06/16/2022 Time: SLP Start Time (ACUTE ONLY): [phone] SLP Stop Time (ACUTE ONLY): 1017 SLP Time Calculation (min) (ACUTE ONLY): 34 min  Past Medical History:  Past Medical History:  Diagnosis Date   CAD (coronary artery disease) 11/04/2016   Cancer ([place])    skin cancer   Cauda equina compression ([place]) 08/28/2021   Cerebrovascular disease    Chronic atrial fibrillation ([place]) 09/28/2018   Chronic bilateral low back pain without sciatica 03/08/2018   Chronic kidney disease    Chronic obstructive lung disease ([place]) 09/28/2018   Chronic pain syndrome 03/08/2018   Chronic renal failure 09/28/2018   Chronic, continuous use of opioids 03/04/2021   Last Assessment & Plan:  Formatting of this note might be different from the original. Patient and I have discussed the hazardous effects of continued opiate pain medication usage. Risks and benefits of above medications including but not limited to possibility of respiratory depression, sedation, and even death were discussed with the patient who expressed an understanding.  Patient did not displ   CKD (chronic kidney disease) stage 3, GFR 30-59 ml/min (HCC)    Class 1 obesity due to excess calories with serious comorbidity and body mass index (BMI) of 34.0 to 34.9 in adult 05/20/2021   Last Assessment & Plan: Formatting of this note might be different from the original. Patient educated about the detrimental effects of weight as it specifically pertains to pain management and overall health.  Patient's BMI 34.08 Encouraged healthy eating habits and routine low-impact cardiovascular exercises as tolerated.   Comprehensive diabetic foot examination, type 2 DM, encounter for ([place]) 04/29/2021   DDD (degenerative disc disease), lumbar 11/17/2018   Last Assessment & Plan:  Formatting of this note might be different from the  original. See spinal stenosis plan   Diabetes ([place])    Diabetes mellitus due to underlying condition with unspecified complications ([place]) [id number]   Dyshidrotic eczema 04/29/2021   Dysrhythmia    afib   Essential hypertension 11/04/2016   Facet arthritis of lumbar region 03/08/2018   Facet joint disease 11/17/2018   Last Assessment & Plan:  Formatting of this note might be different from the original. See spinal stenosis plan   Foraminal stenosis of lumbar region 03/08/2018   Heart murmur    History of knee replacement, total, bilateral 11/17/2018   Last Assessment & Plan:  Formatting of this note might be different from the original. Have recommended to her several times if any further concerns in regards to the pain status post arthroplasty, she is to follow up with her surgeon.   History of pulmonary embolism 11/28/2021   Hypertension    Hypertensive disorder 09/28/2018   Increased body mass index 09/28/2018   Lumbar radiculopathy 03/08/2018   Lumbar spondylosis 03/20/2019   Mixed dyslipidemia 11/04/2016   Morbid obesity ([place]) 11/04/2016   Muscle pain 04/29/2020   Osteoarthritis of left knee    PAF (paroxysmal atrial fibrillation) ([place]) 04/30/2015   Pain management contract agreement 04/13/2019   Last Assessment & Plan:  Formatting of this note might be different from the original. Contract updated today.  UDS completed.  [place] controlled substance registry reviewed and is consistent with her regimen.   Peripheral vascular disease ([place]) 09/28/2018   Preop cardiovascular exam 05/01/2019   Pressure injury of skin 11/21/2021   Pulmonary emboli [facility])    Pulmonary embolism ([place]) 11/21/2021  Renal insufficiency 04/30/2015   S/P insertion of spinal cord stimulator 07/13/2019   Last Assessment & Plan:  Formatting of this note might be different from the original. About 5 months status post insertion of spinal cord stimulator with excellent relief.  She continues to keep close  follow-up with her representative and has been working to appropriately find best programs to get her adequate pain relief.   Sleep apnea    Smoker 09/28/2018   Spinal stenosis of lumbar region 03/20/2019   Spinal stenosis of lumbar region with neurogenic claudication 03/08/2018   Last Assessment & Plan:  Formatting of this note is different from the original. 77 year old female with chronic low back pain and bilateral, right greater than left, L4-5 radicular pain, and claudication. She has substantial amount of multifactorial degenerative thoracic and lumbar spine pathology.  After being deemed an inappropriate open surgical candidate, and failing to respond to multiple in   Spondylolisthesis 03/20/2019   Spondylosis of lumbosacral region without myelopathy or radiculopathy 01/30/2019   Last Assessment & Plan:  Formatting of this note might be different from the original. See spinal stenosis plan   Tinea unguium 02/03/2022   Type 2 diabetes mellitus ([place]) 09/28/2018   Past Surgical History:  Past Surgical History:  Procedure Laterality Date   ABDOMINAL HYSTERECTOMY     BACK SURGERY     EYE SURGERY     bilateral cataracts   IR ANGIOGRAM PULMONARY BILATERAL SELECTIVE  11/21/2021   IR ANGIOGRAM SELECTIVE EACH ADDITIONAL VESSEL  11/21/2021   IR ANGIOGRAM SELECTIVE EACH ADDITIONAL VESSEL  11/21/2021   IR THROMBECT PRIM MECH INIT (INCLU) MOD SED  11/21/2021   IR US GUIDE VASC ACCESS RIGHT  11/21/2021   JOINT REPLACEMENT Bilateral    knees   RADIOLOGY WITH ANESTHESIA N/A 11/21/2021   Procedure: IR WITH ANESTHESIA;  Surgeon: Radiologist, Medication, MD;  Location: [facility];  Service: Radiology;  Laterality: N/A;   SPINAL CORD STIMULATOR INSERTION N/A 07/07/2019   Procedure: LUMBAR SPINAL CORD STIMULATOR INSERTION;  Surgeon: Clydell Hakim, MD;  Location: [facility];  Service: Neurosurgery;  Laterality: N/A;   total left knee     HPI:  Lisa Maxwell is a 77 y.o. female who presented to hospital with left  foot pain and weakness. Head CT and CXR 3/5 with no acute findings.  Esophagram 3/6: "1. Narrowing of the distal esophagus which does not allow passage of the 45m barium tablet.  2. Tertiary contractions leading to moderate dysmotility."  Pt with past medical history of atrial fibrillation on Eliquis, hypertension, COPD, diabetes mellitus type 2, hyperlipidemia, obesity, history of pulmonary embolism on Eliquis, CAD, status post low back pain and sciatica, chronic pain syndrome on spinal stimulator and peripheral vascular disease.    Assessment / Plan / Recommendation  Clinical Impression  Pt seen for swallowing evaluation in setting of esophageal narrowing and dysmotility.  Pt consfused in mitten restraints and averse to oral care.  Pt withdrawing to touch.  With limited evaluation of oral cavity mucosa is dry and black with black secretions on lips and spilling from R side of oral cavity (likely 2/2 positioning rather than a labial insufficiency).  She was striking out even with minimal stimulation to lips.  Husband held hands as SLP attempted oral care, but pt refused.  Pt refused all PO trials, despite family encouragement and offer of preferred drinks, and a diet recommendation cannot be made at this time.  Provided education regarding esophageal dysphagia verbally and provided handout  to husband.  On review of esophagram imaging from 3/6 the esophagus fills with contrast. Backflow noted, as well, within the esophagus.  Husband reports very little PO intake.  Pt may not be able to meet nutritional needs.  Consider GI consult to address esophageal dysphagia if pt is a candidate for GI intervention.    SLP cannot make recommendations regarding oral diet at this time.  Recommend crushing medications as tablet was unable to pass through LES on barium swallow study.  SLP Visit Diagnosis: Dysphagia, pharyngoesophageal phase (R13.14)    Aspiration Risk  Mild aspiration risk    Diet Recommendation   (unable to make recommendations at this time)   Medication Administration: Crushed with puree Supervision: Full supervision/cueing for compensatory strategies Postural Changes: Seated upright at 90 degrees;Remain upright for at least 30 minutes after po intake    Other  Recommendations Recommended Consults: Consider GI evaluation Oral Care Recommendations: Oral care QID    Recommendations for follow up therapy are one component of a multi-disciplinary discharge planning process, led by the attending physician.  Recommendations may be updated based on patient status, additional functional criteria and insurance authorization.  Follow up Recommendations  (TBD)      Assistance Recommended at Discharge    Functional Status Assessment Patient has had a recent decline in their functional status and demonstrates the ability to make significant improvements in function in a reasonable and predictable amount of time.  Frequency and Duration min 2x/week  2 weeks       Prognosis Prognosis for improved oropharyngeal function: [place] Date of Onset: 06/09/22 HPI: Lisa Maxwell is a 77 y.o. female who presented to hospital with left foot pain and weakness. Head CT and CXR 3/5 with no acute findings.  Esophagram 3/6: "1. Narrowing of the distal esophagus which does not allow passage of the 70m barium tablet.  2. Tertiary contractions leading to moderate dysmotility."  Pt with past medical history of atrial fibrillation on Eliquis, hypertension, COPD, diabetes mellitus type 2, hyperlipidemia, obesity, history of pulmonary embolism on Eliquis, CAD, status post low back pain and sciatica, chronic pain syndrome on spinal stimulator and peripheral vascular disease. Type of Study: Bedside Swallow Evaluation Previous Swallow Assessment: none, BaSw this admission Diet Prior to this Study: Regular;Thin liquids (Level 0) Temperature Spikes Noted: No History of Recent Intubation:  No Behavior/Cognition: Uncooperative;Lethargic/Drowsy;Agitated Oral Cavity Assessment: Dry (Black oral cavity from iron pill which she held in in mouth per RN) Oral Care Completed by SLP: Yes (Attempted.  Pt would not allow, even with husband holding hands) Oral Cavity - Dentition: Dentures, bottom;Dentures, top Baseline Vocal Quality: Normal (some rattling breath sound in absence of POs)    Oral/Motor/Sensory Function Overall Oral Motor/Sensory Function:  (Could not test)   Ice Chips Ice chips: Not tested   Thin Liquid Thin Liquid: Not tested    Nectar Thick Nectar Thick Liquid: Not tested   Honey Thick Honey Thick Liquid: Not tested   Puree Puree: Not tested   Solid     Solid: Not tested      LCeledonio Savage MA, CStrasburgOffice: [phone]/03/2023,10:44 AM

## 2022-06-16 NOTE — Consult Note (Signed)
Referring Provider: Dr. Flora Lipps Primary Care Physician:  Nicoletta Dress, MD Primary Gastroenterologist:  Althia Forts   Reason for Consultation:  Dysphagia, CT showed debris in the esophagus, abnormal barium swallow study  HPI: Lisa Maxwell is a 77 y.o. female with a past medical history of morbid obesity, hypertension, hyperlipidemia, coronary artery disease, atrial fibrillation on Eliquis, CVA, PE, diabetes mellitus type 2 CKD stage IIIb, COPD, emphysema, cauda equina syndrome with bilateral low back pain and sciatica s/p lumbar fusion.   She was admitted to the hospital 06/09/2022 due to having increased left foot pain and left leg weakness after falling at home the previous week. Admission labs showed WBC count 11.7.  Hemoglobin 15.1.  Glucose 120.  Creatinine 1.40. Head CT without evidence of acute intracranial abnormality.Cervical spine CT negative for spinal fracture. Lumbar CT and MRI were done which showed continued stenosis and L1  compression fracture, followed by neurosurgery. A thoracic spine MRI was negative for thoracic fracture but showed showed a small amount of secretions/debris in the esophagus with mild nonspecific soft tissue stranding of the GE junction.  A barium swallow study showed narrowing of the distal esophagus which did not allow passage of the barium tablet with tertiary contractions consistent with esophageal dysmotility. A swallow study was attempted by speech pathology today, however, the evaluation was incomplete as the patient was uncooperative and striking out. A GI consult was requested prior to being discharged to SNF due to the patient experiencing dysphagia for the past few days.  The patient is currently sitting up in the chair and is somewhat somnolent but arousable but is unable to provide any history.  She has not received any narcotics thus far today. I spoke to her daughter, Lisa Maxwell,  who is currently at the bedside and she called her father and  collectively they were able to provide the patient's history to the best of their ability.  Patient does not have any history of GERD or ulcers.  Never had an EGD. She sometimes coughs when she drinks liquids.  Her husband reported he noticed the patient had difficulty swallowing solid foods over the past 2 to 3 days.  Her daughter fed her mother 3-4 spoonfuls of mashed potatoes earlier today without any observed difficulty with swallowing.  No nausea or vomiting.  She has chronic constipation and takes milk of magnesia as needed which results in passing a large hard stool with blood.  She had at least one colonoscopy in her lifetime which was more than 10 years ago, unknown. No known family history of esophageal, gastric or colon cancer.    Past Medical History:  Diagnosis Date   CAD (coronary artery disease) 11/04/2016   Cancer ([place])    skin cancer   Cauda equina compression ([place]) 08/28/2021   Cerebrovascular disease    Chronic atrial fibrillation ([place]) 09/28/2018   Chronic bilateral low back pain without sciatica 03/08/2018   Chronic kidney disease    Chronic obstructive lung disease ([place]) 09/28/2018   Chronic pain syndrome 03/08/2018   Chronic renal failure 09/28/2018   Chronic, continuous use of opioids 03/04/2021   Last Assessment & Plan:  Formatting of this note might be different from the original. Patient and I have discussed the hazardous effects of continued opiate pain medication usage. Risks and benefits of above medications including but not limited to possibility of respiratory depression, sedation, and even death were discussed with the patient who expressed an understanding.  Patient did not displ  CKD (chronic kidney disease) stage 3, GFR 30-59 ml/min (HCC)    Class 1 obesity due to excess calories with serious comorbidity and body mass index (BMI) of 34.0 to 34.9 in adult 05/20/2021   Last Assessment & Plan: Formatting of this note might be different from the original. Patient  educated about the detrimental effects of weight as it specifically pertains to pain management and overall health.  Patient's BMI 34.08 Encouraged healthy eating habits and routine low-impact cardiovascular exercises as tolerated.   Comprehensive diabetic foot examination, type 2 DM, encounter for ([place]) 04/29/2021   DDD (degenerative disc disease), lumbar 11/17/2018   Last Assessment & Plan:  Formatting of this note might be different from the original. See spinal stenosis plan   Diabetes ([place])    Diabetes mellitus due to underlying condition with unspecified complications ([place]) [id number]   Dyshidrotic eczema 04/29/2021   Dysrhythmia    afib   Essential hypertension 11/04/2016   Facet arthritis of lumbar region 03/08/2018   Facet joint disease 11/17/2018   Last Assessment & Plan:  Formatting of this note might be different from the original. See spinal stenosis plan   Foraminal stenosis of lumbar region 03/08/2018   Heart murmur    History of knee replacement, total, bilateral 11/17/2018   Last Assessment & Plan:  Formatting of this note might be different from the original. Have recommended to her several times if any further concerns in regards to the pain status post arthroplasty, she is to follow up with her surgeon.   History of pulmonary embolism 11/28/2021   Hypertension    Hypertensive disorder 09/28/2018   Increased body mass index 09/28/2018   Lumbar radiculopathy 03/08/2018   Lumbar spondylosis 03/20/2019   Mixed dyslipidemia 11/04/2016   Morbid obesity ([place]) 11/04/2016   Muscle pain 04/29/2020   Osteoarthritis of left knee    PAF (paroxysmal atrial fibrillation) ([place]) 04/30/2015   Pain management contract agreement 04/13/2019   Last Assessment & Plan:  Formatting of this note might be different from the original. Contract updated today.  UDS completed.  [place] controlled substance registry reviewed and is consistent with her regimen.   Peripheral vascular disease  ([place]) 09/28/2018   Preop cardiovascular exam 05/01/2019   Pressure injury of skin 11/21/2021   Pulmonary emboli [facility])    Pulmonary embolism ([place]) 11/21/2021   Renal insufficiency 04/30/2015   S/P insertion of spinal cord stimulator 07/13/2019   Last Assessment & Plan:  Formatting of this note might be different from the original. About 5 months status post insertion of spinal cord stimulator with excellent relief.  She continues to keep close follow-up with her representative and has been working to appropriately find best programs to get her adequate pain relief.   Sleep apnea    Smoker 09/28/2018   Spinal stenosis of lumbar region 03/20/2019   Spinal stenosis of lumbar region with neurogenic claudication 03/08/2018   Last Assessment & Plan:  Formatting of this note is different from the original. 77 year old female with chronic low back pain and bilateral, right greater than left, L4-5 radicular pain, and claudication. She has substantial amount of multifactorial degenerative thoracic and lumbar spine pathology.  After being deemed an inappropriate open surgical candidate, and failing to respond to multiple in   Spondylolisthesis 03/20/2019   Spondylosis of lumbosacral region without myelopathy or radiculopathy 01/30/2019   Last Assessment & Plan:  Formatting of this note might be different from the original. See spinal stenosis plan   Tinea  unguium 02/03/2022   Type 2 diabetes mellitus ([place]) 09/28/2018    Past Surgical History:  Procedure Laterality Date   ABDOMINAL HYSTERECTOMY     BACK SURGERY     EYE SURGERY     bilateral cataracts   IR ANGIOGRAM PULMONARY BILATERAL SELECTIVE  11/21/2021   IR ANGIOGRAM SELECTIVE EACH ADDITIONAL VESSEL  11/21/2021   IR ANGIOGRAM SELECTIVE EACH ADDITIONAL VESSEL  11/21/2021   IR THROMBECT PRIM MECH INIT (INCLU) MOD SED  11/21/2021   IR US GUIDE VASC ACCESS RIGHT  11/21/2021   JOINT REPLACEMENT Bilateral    knees   RADIOLOGY WITH ANESTHESIA N/A  11/21/2021   Procedure: IR WITH ANESTHESIA;  Surgeon: Radiologist, Medication, MD;  Location: [facility];  Service: Radiology;  Laterality: N/A;   SPINAL CORD STIMULATOR INSERTION N/A 07/07/2019   Procedure: LUMBAR SPINAL CORD STIMULATOR INSERTION;  Surgeon: Clydell Hakim, MD;  Location: [facility];  Service: Neurosurgery;  Laterality: N/A;   total left knee      Prior to Admission medications   Medication Sig Start Date End Date Taking? Authorizing Provider  apixaban (ELIQUIS) 5 MG TABS tablet Take 1 tablet (5 mg total) by mouth 2 (two) times daily. 11/28/21  Yes Revankar, Reita Cliche, MD  Coenzyme Q10 (CO Q-10) 200 MG CAPS Take 200 mg by mouth in the morning.   Yes [provider]  Cyanocobalamin (VITAMIN B-12) 500 MCG SUBL Place 500 mcg under the tongue in the morning.   Yes [provider]  diphenhydrAMINE (BENADRYL) 25 MG tablet Take 25 mg by mouth in the morning.   Yes [provider]  docusate sodium (COLACE) 100 MG capsule Take 100 mg by mouth daily. 10/08/21  Yes [provider]  fluticasone furoate-vilanterol (BREO ELLIPTA) 100-25 MCG/ACT AEPB Inhale 1-2 puffs into the lungs daily.   Yes [provider]  gabapentin (NEURONTIN) 600 MG tablet Take 600 mg by mouth at bedtime. 04/06/21  Yes [provider]  HYDROcodone-acetaminophen (NORCO) 10-325 MG tablet Take 0.5 tablets by mouth 4 (four) times daily.   Yes [provider]  Iron, Ferrous Sulfate, 325 (65 Fe) MG TABS Take 65 mg of iron by mouth daily with breakfast.   Yes [provider]  lovastatin (MEVACOR) 40 MG tablet Take 40 mg by mouth at bedtime.    Yes [provider]  methocarbamol (ROBAXIN) 500 MG tablet Take 1 tablet (500 mg total) by mouth every 6 (six) hours as needed for muscle spasms. Patient taking differently: Take 250 mg by mouth 3 (three) times daily. 08/29/21  Yes Consuella Lose, MD  Multiple Vitamins-Minerals (CENTRUM SILVER 50+WOMEN) TABS Take 1 tablet  by mouth daily with breakfast.   Yes [provider]  naloxone (NARCAN) nasal spray 4 mg/0.1 mL Place 1 spray into the nose daily as needed (accidental overdose).   Yes [provider]  nitroGLYCERIN (NITROSTAT) 0.4 MG SL tablet Place 0.4 mg under the tongue every 5 (five) minutes as needed for chest pain.   Yes [provider]  pantoprazole (PROTONIX) 40 MG tablet Take 1 tablet (40 mg total) by mouth daily. Patient taking differently: Take 40 mg by mouth daily as needed (for acid reflux). 11/25/21  Yes Ghimire, Henreitta Leber, MD  RYBELSUS 3 MG TABS Take 3 mg by mouth daily before breakfast. 05/12/21  Yes [provider]  triamcinolone ointment (KENALOG) 0.5 % Apply 1 Application topically See admin instructions. Apply to affected areas of both feet two to three times a week  Yes [provider]  trimethoprim (TRIMPEX) 100 MG tablet Take 100 mg by mouth in the morning. 10/25/21  Yes [provider]  latanoprost (XALATAN) 0.005 % ophthalmic solution Place 1 drop into both eyes at bedtime. Patient not taking: Reported on 06/09/2022 11/17/21   [provider]  NONFORMULARY OR COMPOUNDED ITEM Please evaluate and treat for outpatient PT Diagnoses: Deconditioning due to Acute Hypoxia/Shock from saddle embolus 11/24/21   Jonetta Osgood, MD    Current Facility-Administered Medications  Medication Dose Route Frequency Provider Last Rate Last Admin   0.9 %  sodium chloride infusion  250 mL Intravenous PRN Toy Baker, MD       acetaminophen (TYLENOL) tablet 650 mg  650 mg Oral Q6H PRN Toy Baker, MD   650 mg at 06/14/22 1015   Or   acetaminophen (TYLENOL) suppository 650 mg  650 mg Rectal Q6H PRN Doutova, Anastassia, MD       albuterol (PROVENTIL) (2.5 MG/3ML) 0.083% nebulizer solution 2.5 mg  2.5 mg Nebulization Q2H PRN Doutova, Anastassia, MD       apixaban (ELIQUIS) tablet 5 mg  5 mg Oral BID Doutova, Anastassia, MD   5 mg at  06/15/22 2346   bisacodyl (DULCOLAX) suppository 10 mg  10 mg Rectal Daily PRN Doutova, Nyoka Lint, MD       docusate sodium (COLACE) capsule 100 mg  100 mg Oral Daily Doutova, Anastassia, MD   100 mg at 06/15/22 0951   feeding supplement (ENSURE ENLIVE / ENSURE PLUS) liquid 237 mL  237 mL Oral TID BM Pokhrel, Laxman, MD   237 mL at 06/15/22 2346   ferrous sulfate tablet 325 mg  325 mg Oral Q breakfast Doutova, Anastassia, MD   325 mg at 06/16/22 0816   fluticasone furoate-vilanterol (BREO ELLIPTA) 100-25 MCG/ACT 1-2 puff  1-2 puff Inhalation Daily Doutova, Anastassia, MD   1 puff at 06/15/22 0915   gabapentin (NEURONTIN) capsule 600 mg  600 mg Oral QHS Doutova, Anastassia, MD   600 mg at 06/15/22 2346   HYDROcodone-acetaminophen (NORCO/VICODIN) 5-325 MG per tablet 1 tablet  1 tablet Oral Q4H PRN Pokhrel, Laxman, MD       insulin aspart (novoLOG) injection 0-5 Units  0-5 Units Subcutaneous QHS Doutova, Anastassia, MD       insulin aspart (novoLOG) injection 0-9 Units  0-9 Units Subcutaneous TID WC Doutova, Anastassia, MD   1 Units at 06/16/22 1231   latanoprost (XALATAN) 0.005 % ophthalmic solution 1 drop  1 drop Both Eyes QHS Doutova, Anastassia, MD   1 drop at 06/15/22 2355   magnesium oxide (MAG-OX) tablet 400 mg  400 mg Oral BID Pokhrel, Laxman, MD   400 mg at 06/16/22 1129   multivitamin with minerals tablet 1 tablet  1 tablet Oral Daily Pokhrel, Laxman, MD   1 tablet at 06/15/22 0952   ondansetron (ZOFRAN) tablet 4 mg  4 mg Oral Q6H PRN Toy Baker, MD       Or   ondansetron (ZOFRAN) injection 4 mg  4 mg Intravenous Q6H PRN Doutova, Anastassia, MD       pantoprazole (PROTONIX) EC tablet 40 mg  40 mg Oral Daily Doutova, Anastassia, MD   40 mg at 06/15/22 0951   polyethylene glycol (MIRALAX / GLYCOLAX) packet 17 g  17 g Oral Daily PRN Doutova, Anastassia, MD       pravastatin (PRAVACHOL) tablet 40 mg  40 mg Oral q1800 Doutova, Anastassia, MD   40 mg at 06/15/22 [id number]  sodium chloride  flush (NS) 0.9 % injection 3 mL  3 mL Intravenous Q12H Doutova, Anastassia, MD   3 mL at 06/16/22 1142   sodium chloride flush (NS) 0.9 % injection 3 mL  3 mL Intravenous PRN Toy Baker, MD   3 mL at 06/12/22 0931   white petrolatum (VASELINE) gel   Topical PRN Pokhrel, Corrie Mckusick, MD        Allergies as of 06/09/2022   (No Known Allergies)    Family History  Problem Relation Age of Onset   Diabetes Mother     Social History   Socioeconomic History   Marital status: Married    Spouse name: Not on file   Number of children: Not on file   Years of education: Not on file   Highest education level: Not on file  Occupational History   Not on file  Tobacco Use   Smoking status: Former    Types: Cigarettes    Quit date: 05/08/2019    Years since quitting: 3.1   Smokeless tobacco: Never  Vaping Use   Vaping Use: Never used  Substance and Sexual Activity   Alcohol use: No   Drug use: No   Sexual activity: Not on file  Other Topics Concern   Not on file  Social History Narrative   Not on file   Social Determinants of Health   Financial Resource Strain: Not on file  Food Insecurity: No Food Insecurity (06/10/2022)   Hunger Vital Sign    Worried About Running Out of Food in the Last Year: Never true    Ran Out of Food in the Last Year: Never true  Transportation Needs: No Transportation Needs (06/10/2022)   PRAPARE - Hydrologist (Medical): No    Lack of Transportation (Non-Medical): No  Physical Activity: Not on file  Stress: Not on file  Social Connections: Not on file  Intimate Partner Violence: Not At Risk (06/10/2022)   Humiliation, Afraid, Rape, and Kick questionnaire    Fear of Current or Ex-Partner: No    Emotionally Abused: No    Physically Abused: No    Sexually Abused: No    Review of Systems: Limited review of systems as the patient is unable to respond to many questions.  History obtained from the patient's family, see  HPI.  Physical Exam: Vital signs in last 24 hours: Temp:  [97.5 F (36.4 C)-98.4 F (36.9 C)] 98.1 F (36.7 C) (03/12 1158) Pulse Rate:  [69-108] 91 (03/12 1158) Resp:  [17-20] 20 (03/12 1158) BP: (102-135)/(72-120) 103/83 (03/12 1158) SpO2:  [96 %-100 %] 96 % (03/12 1158) Last BM Date : 06/14/22 General: 77 year old female is somnolent sitting up in the chair but arousable.  Back brace in use. Head:  Normocephalic and atraumatic. Eyes:  No scleral icterus. Conjunctiva pink. Ears:  Normal auditory acuity. Nose:  No deformity, discharge or lesions. Mouth: Upper and lower dentures intact.  No ulcers or lesions.  Neck:  Supple. No lymphadenopathy or thyromegaly.  Lungs: Breath sounds coarse, decreased in the bases bilaterally. Heart: S1, S2, regular rate and rhythm, no murmurs. Abdomen: Soft, nondistended.  Nontender.  Positive bowel sounds to all 4 quadrants. Rectal: Deferred. Musculoskeletal:  Symmetrical without gross deformities.  Pulses:  Normal pulses noted. Extremities:  Without clubbing or edema. Neurologic:  Alert and  oriented x 1.  She answers yes or no to questions.  She moves all extremities weakly. Skin:  Intact without significant lesions or rashes.  Psych:  Alert and cooperative. Normal mood and affect.  Intake/Output from previous day: 03/11 0701 - 03/12 0700 In: 440 [P.O.:440] Out: 500 [Urine:500] Intake/Output this shift: No intake/output data recorded.  Lab Results: Recent Labs    06/14/22 0403  WBC 6.6  HGB 14.0  HCT 42.6  PLT 180   BMET Recent Labs    06/14/22 0403  NA 137  K 4.3  CL 105  CO2 24  GLUCOSE 134*  BUN 16  CREATININE 1.10*  CALCIUM 10.2   LFT No results for input(s): "PROT", "ALBUMIN", "AST", "ALT", "ALKPHOS", "BILITOT", "BILIDIR", "IBILI" in the last 72 hours. PT/INR No results for input(s): "LABPROT", "INR" in the last 72 hours. Hepatitis Panel No results for input(s): "HEPBSAG", "HCVAB", "HEPAIGM", "HEPBIGM" in the last  72 hours.    Studies/Results: No results found.  IMPRESSION/PLAN:  77 year old female with chronic lumbar stenosis admitted to the hospital 06/09/2022 with back back and left foot pain.  MRI identified L1 fracture.  -Management per the medical service -When medically appropriate, patient will be discharged to SNF  New onset dysphagia past 2 to 3 days. Thoracic MRI (done to evaluate her back pain) showed a small amount of secretions/debris in the esophagus with mild nonspecific soft tissue stranding of the GE junction. A barium swallow study showed narrowing of the distal esophagus which did not allow passage of the barium tablet with tertiary contractions consistent with esophageal dysmotility. A swallow study was attempted by speech pathology today, however, the evaluation was incomplete as the patient was uncooperative and striking out.  -Consider EGD during this hospital admission, await further recommendations per Dr. Rush Landmark. The patient's daughter and spouse consent to EGD if medically necessary. -Will need to hold Eliquis for 2 days if EGD pursued -Continue Pantoprazole 40 mg daily -Consider repeat swallow study with speech pathology when patient more cooperative -Discharge to SNF pending GI evaluation  History of atrial fibrillation, remote PE on Eliquis. CHA2DS2-VASc score of 8.  History of CAD  Chronic constipation -MiraLAX nightly as needed -Dulcolax suppository nightly as needed  1.8 cm left thyroid nodule per thoracic MRI -Follow-up with PCP as an outpatient  Noralyn Pick  06/16/2022, 3:31PM

## 2022-06-16 NOTE — Plan of Care (Signed)

## 2022-06-16 NOTE — Progress Notes (Signed)
Occupational Therapy Treatment Patient Details Name: Lisa Maxwell MRN: [id number] DOB: October 29, 1945 Today's Date: 06/16/2022   History of present illness 77 y.o. female who was admitted 06/09/22 for L foot pain and weakness with hx of recent fall. Pt found to have L1 fracture. PMH: A-fib on Eliquis, HTN, COPD, diabetes, HLD, obesity, history of pulmonary embolism on Eliquis, CAD, history of cauda equina syndrome bilateral low back pain with sciatica, CKD, chronic pain syndrome PVD.   OT comments  Patient received in supine and appeared lethargic but agreed to participate with OT.  Patient required mod assist x2 to get to EOB due to difficulty adhering to back precautions. Patient requiring total assist to donn TLSO brace. Patient demonstrated poor safety and weakness and was assist to recliner with [facility] for safe transfer with patient demonstrating limited standing tolerance. Patient performed grooming while seated in recliner. Patient demonstrating limited progress this treatment session due to lethargic and weakness. Acute OT to continue to follow.    Recommendations for follow up therapy are one component of a multi-disciplinary discharge planning process, led by the attending physician.  Recommendations may be updated based on patient status, additional functional criteria and insurance authorization.    Follow Up Recommendations  Skilled nursing-short term rehab (<3 hours/day) (may have [facility] if patient's family can provide 24/7 assist)     Assistance Recommended at Discharge Frequent or constant Supervision/Assistance  Patient can return home with the following  Two people to help with walking and/or transfers;Two people to help with bathing/dressing/bathroom;Assist for transportation;Help with stairs or ramp for entrance;Assistance with cooking/housework   Equipment Recommendations  Other (comment) (continue to assess)    Recommendations for Other Services      Precautions /  Restrictions Precautions Precautions: Fall;Back;Other (comment) Precaution Comments: TLSO brace - may remove to shower; R foot drop - AFO in room Restrictions Weight Bearing Restrictions: No       Mobility Bed Mobility Overal bed mobility: Needs Assistance Bed Mobility: Rolling, Sidelying to Sit Rolling: Max assist Sidelying to sit: Mod assist, +2 for physical assistance       General bed mobility comments: poor initiation, required two for safety and back precautions    Transfers Overall transfer level: Needs assistance Equipment used: Ambulation equipment used Transfers: Sit to/from Stand, Bed to chair/wheelchair/BSC Sit to Stand: Mod assist, +2 physical assistance           General transfer comment: stedy used for transfer to recliner due to lethargic and patient demonstrating poor safety and weakness Transfer via Lift Equipment: Stedy   Balance Overall balance assessment: Needs assistance Sitting-balance support: Feet supported, Bilateral upper extremity supported Sitting balance-Leahy Scale: Poor Sitting balance - Comments: reliant on support   Standing balance support: Bilateral upper extremity supported, Reliant on assistive device for balance Standing balance-Leahy Scale: Poor Standing balance comment: reliant on stedy for support                           ADL either performed or assessed with clinical judgement   ADL Overall ADL's : Needs assistance/impaired     Grooming: Wash/dry hands;Wash/dry face;Brushing hair;Minimal assistance;Sitting Grooming Details (indicate cue type and reason): in recliner                                    Extremity/Trunk Assessment  Vision       Perception     Praxis      Cognition Arousal/Alertness: Lethargic Behavior During Therapy: Flat affect Overall Cognitive Status: Impaired/Different from baseline Area of Impairment: Attention, Following commands, Awareness,  Problem solving, Memory                   Current Attention Level: Sustained Memory: Decreased recall of precautions Following Commands: Follows one step commands consistently, Follows one step commands with increased time, Follows multi-step commands inconsistently   Awareness: Emergent Problem Solving: Slow processing, Difficulty sequencing, Requires verbal cues, Requires tactile cues, Decreased initiation General Comments: lethargic but agreed to work with OT, became more alert once in recliner. Poor recall of back precautions        Exercises      Shoulder Instructions       General Comments      Pertinent Vitals/ Pain       Pain Assessment Pain Assessment: Faces Faces Pain Scale: Hurts even more Pain Location: generalized Pain Descriptors / Indicators: Discomfort, Grimacing, Guarding Pain Intervention(s): Limited activity within patient's tolerance, Monitored during session, Repositioned  Home Living                                          Prior Functioning/Environment              Frequency  Min 2X/week        Progress Toward Goals  OT Goals(current goals can now be found in the care plan section)  Progress towards OT goals: Progressing toward goals  Acute Rehab OT Goals Patient Stated Goal: get better OT Goal Formulation: With patient Time For Goal Achievement: 06/24/22 Potential to Achieve Goals: Good ADL Goals Pt Will Perform Grooming: with set-up;with modified independence;sitting Pt Will Perform Lower Body Dressing: with caregiver independent in assisting;with set-up;sitting/lateral leans;sit to/from stand;bed level;with min assist Pt Will Transfer to Toilet: with supervision;stand pivot transfer;with transfer board;anterior/posterior transfer;bedside commode;with min guard assist Pt Will Perform Toileting - Clothing Manipulation and hygiene: with min guard assist;with min assist;with caregiver independent in  assisting;sitting/lateral leans Additional ADL Goal #1: Pt will be Mod I bed mobility, adhering to back precautions and applying TLSO in preparation for increased participation in ADL's  Plan Discharge plan remains appropriate    Co-evaluation                 AM-PAC OT "6 Clicks" Daily Activity     Outcome Measure   Help from another person eating meals?: None Help from another person taking care of personal grooming?: A Little Help from another person toileting, which includes using toliet, bedpan, or urinal?: A Lot Help from another person bathing (including washing, rinsing, drying)?: A Lot Help from another person to put on and taking off regular upper body clothing?: A Little Help from another person to put on and taking off regular lower body clothing?: Total 6 Click Score: 15    End of Session Equipment Utilized During Treatment: Back brace;Other (comment) Charlaine Dalton)  OT Visit Diagnosis: Other abnormalities of gait and mobility (R26.89);History of falling (Z91.81);Pain   Activity Tolerance Patient limited by lethargy;Patient limited by pain   Patient Left in chair;with call bell/phone within reach;with chair alarm set;with family/visitor present   Nurse Communication Mobility status;Need for lift equipment        Time: [id number] OT Time Calculation (min): 25 min  Charges: OT General Charges $OT Visit: 1 Visit OT Treatments $Self Care/Home Management : 8-22 mins $Therapeutic Activity: 8-22 mins  Lodema Hong, OTA Acute Rehabilitation Services  Office [phone]   Trixie Dredge 06/16/2022, 2:52 PM

## 2022-06-16 NOTE — Progress Notes (Signed)
PROGRESS NOTE    [name]  [id number] DOB: 06-28-1945 DOA: 06/09/2022 PCP: Nicoletta Dress, MD    Brief Narrative:   Lisa Maxwell is a 77 y.o. female with past medical history of atrial fibrillation on Eliquis, hypertension, COPD, diabetes mellitus type 2, hyperlipidemia, obesity, history of pulmonary embolism on Eliquis, CAD,  status post low back pain and sciatica, chronic pain syndrome on spinal stimulator and peripheral vascular disease presented to hospital with left foot pain and weakness.  Patient already has right foot weakness and is on a brace for at least a week now but now both legs were hurting.  Patient did have a fall a week back when she slumped against the wall and slid down and started having severe foot pain on the left.  She uses walker at baseline but has been wheelchair-bound at this time. In the ED, patient had mild tachycardia.  She did have mild leukocytosis at 11.7.  Chemistry showed creatinine at 1.4.  CT scan of the lumbar spine showed L1 inferior endplate fracture, CT head was nonacute.  Chest x-ray was negative.  She was given IV Dilaudid, neurosurgery was consulted and patient was admitted hospital for further evaluation and treatment.   T SLO brace has been prescribed by neurosurgery.  Patient had abnormal esophageal findings on CT scan and barium swallow was done which showed tertiary contractions with difficulty passing the barium tablet, patient having more dysphagia today so GI has been consulted. At this time,  patient is awaiting for skilled nursing facility placement pending GI evaluation..   Assessment and plan.  L1 inferior endplate fracture with back and foot pain with ambulatory dysfunction.   Has been more wheelchair-bound at this time.  Continue analgesics.  Physical therapy evaluation recommend skilled nursing facility.  Neurosurgery has recommended putting the patient in TSL0 brace and pain management with outpatient neurosurgery follow-up.   Physical therapy has recommended skilled nursing facility placement on discharge.  Patient does have a spinal cord stimulator in place and will need to be discharged as per the husband the function.  This will minimize the need of narcotics. . Confusion, agitation during the nighttime.  Needed Haldol and sitter.  Likely sundowning. .  Continue delirium precautions.  Have Discontinued fentanyl.  Further decrease the doses of Norco.  Will charge the spinal cord stimulator for its full function and to minimize narcotics and sedation..  CKD (chronic kidney disease) stage 3b Continue to monitor BMP.  Latest creatinine at 1.1.  Mild hypomagnesemia.  Replenished.  Latest magnesium level of 2.2.   CAD (coronary artery disease) Continue Mevacor, aspirin, statin, beta-blocker and Plavix as outpatient.   Essential hypertension Improved blood pressure.  Latest blood pressure of 103/83   Diabetes mellitus due to underlying condition with unspecified complications Continue sliding scale insulin, Accu-Cheks, diabetic diet.  Hold oral medications.  Latest POC glucose of 233.   Mixed dyslipidemia Continue Mevacor    Morbid obesity  Body mass index is 35.2 kg/m.  Would benefit from weight loss as outpatient.   PAF (paroxysmal atrial fibrillation) (HCC) CHA2DS2-VASc score of 8.  Continue Eliquis.  Controlled.  Patient is not taking beta-blocker.   Chronic obstructive lung disease ([place]) Appears compensated.  Continue bronchodilators.  Follow up with pulmonary as outpatient.  Not on oxygen at baseline.   History of DVT/pulmonary embolus (PE) Continue Eliquis.   Foraminal stenosis of lumbar region with chronic pain syndrome. Continue analgesia.  Continue gabapentin and Norco.  Patient does have  a spinal cord stimulator   Esophageal abnormality Barium swallow examination showed a narrowing of the distal esophagus with tertiary contractions.  Barium tablet was unable to pass through the lower  esophageal junction.  Patient has been having more difficulty with swallowing today.  Seen by speech therapy who recommended GI evaluation for abnormal findings.  Communicated with GI for consultation on these findings.  Likely need EGD.  Continue pantoprazole.  OSA (obstructive sleep apnea) CPAP    DVT prophylaxis:  apixaban (ELIQUIS) tablet 5 mg   Code Status:     Code Status: Full Code  Disposition:  PT, OT has recommended skilled nursing facility placement at this time.  Disposition in 1 to 2 days.  Await GI evaluation.  Status is: Inpatient  The is inpatient because:  ambulatory dysfunction,  need for rehabilitation, new dysphagia will get GI evaluation..   Family Communication:  I spoke with the patient's husband at the bedside on 06/16/2022.  Consultants:  Neurosurgery GI  Procedures:  TSLO brace application  Antimicrobials:  None  Anti-infectives (From admission, onward)    None      Subjective: Today, patient was seen and examined at bedside.  Nursing staff reported that she was unable to swallow much with some oral retention,.  Speech therapy recommended  GI evaluation.  Patient appears to be little more somnolent,  Patient's husband at bedside.   Objective: Vitals:   06/15/22 2337 06/16/22 0331 06/16/22 0741 06/16/22 1158  BP: (!) 135/120 132/72 128/72 103/83  Pulse: 76 (!) 108 69 91  Resp: '17 17 20 20  '$ Temp: 97.6 F (36.4 C) (!) 97.5 F (36.4 C) 98.1 F (36.7 C) 98.1 F (36.7 C)  TempSrc: Oral Oral Oral Oral  SpO2: 100% 98% 99% 96%  Weight:      Height:        Intake/Output Summary (Last 24 hours) at 06/16/2022 1413 Last data filed at 06/15/2022 1839 Gross per 24 hour  Intake 200 ml  Output 500 ml  Net -300 ml    Filed Weights   06/09/22 1041  Weight: 71.2 kg    Physical Examination: Body mass index is 35.2 kg/m.   General: Obese, elderly female,, appears to be somnolent,  HENT:   No scleral pallor or icterus noted. Oral mucosa is  moist.  Oral cavity with retained material. Chest:  Clear breath sounds.  No crackles or wheezes.   CVS: S1 &S2 heard. No murmur.  Regular rate and rhythm. Abdomen: Soft, nontender, nondistended.  Bowel sounds are heard.   Extremities: No cyanosis, clubbing with bilateral lower extremity edema.  Peripheral pulses are palpable. Psych: Somnolent, on restraints, mildly Communicative, CNS: Moves all extremities. Skin: Warm and dry.  No rashes noted.  Data Reviewed:   CBC: Recent Labs  Lab 06/10/22 0732 06/11/22 0714 06/14/22 0403  WBC 7.1 6.2 6.6  HGB 14.0 13.5 14.0  HCT 43.2 42.2 42.6  MCV 95.2 95.5 93.4  PLT 188 187 180     Basic Metabolic Panel: Recent Labs  Lab 06/10/22 0732 06/11/22 0714 06/13/22 0356 06/14/22 0403  NA 138 137 135 137  K 4.6 3.8 4.4 4.3  CL 107 107 107 105  CO2 19* '22 22 24  '$ GLUCOSE 120* 124* 184* 134*  BUN '16 14 21 16  '$ CREATININE 1.31* 1.12* 1.18* 1.10*  CALCIUM 10.0 10.1 9.9 10.2  MG 1.8 1.6* 1.6* 2.2  PHOS 2.6  --   --   --      Liver Function  Tests: Recent Labs  Lab 06/10/22 0732  AST 34  ALT 24  ALKPHOS 60  BILITOT 0.6  PROT 6.1*  ALBUMIN 3.3*      Radiology Studies: No results found.    LOS: 5 days    Flora Lipps, MD [facility] Available via Epic secure chat 7am-7pm After these hours, please refer to coverage provider listed on [email or web address] 06/16/2022, 2:13 PM

## 2022-06-17 DIAGNOSIS — J449 Chronic obstructive pulmonary disease, unspecified: Secondary | ICD-10-CM | POA: Diagnosis not present

## 2022-06-17 DIAGNOSIS — S32019A Unspecified fracture of first lumbar vertebra, initial encounter for closed fracture: Secondary | ICD-10-CM | POA: Diagnosis not present

## 2022-06-17 DIAGNOSIS — I129 Hypertensive chronic kidney disease with stage 1 through stage 4 chronic kidney disease, or unspecified chronic kidney disease: Secondary | ICD-10-CM

## 2022-06-17 DIAGNOSIS — K222 Esophageal obstruction: Secondary | ICD-10-CM

## 2022-06-17 DIAGNOSIS — I4892 Unspecified atrial flutter: Secondary | ICD-10-CM

## 2022-06-17 DIAGNOSIS — N1832 Chronic kidney disease, stage 3b: Secondary | ICD-10-CM | POA: Diagnosis not present

## 2022-06-17 DIAGNOSIS — E119 Type 2 diabetes mellitus without complications: Secondary | ICD-10-CM

## 2022-06-17 DIAGNOSIS — R630 Anorexia: Secondary | ICD-10-CM

## 2022-06-17 DIAGNOSIS — E785 Hyperlipidemia, unspecified: Secondary | ICD-10-CM

## 2022-06-17 DIAGNOSIS — I2510 Atherosclerotic heart disease of native coronary artery without angina pectoris: Secondary | ICD-10-CM | POA: Diagnosis not present

## 2022-06-17 DIAGNOSIS — R933 Abnormal findings on diagnostic imaging of other parts of digestive tract: Secondary | ICD-10-CM

## 2022-06-17 DIAGNOSIS — R1319 Other dysphagia: Secondary | ICD-10-CM

## 2022-06-17 DIAGNOSIS — N189 Chronic kidney disease, unspecified: Secondary | ICD-10-CM

## 2022-06-17 DIAGNOSIS — R627 Adult failure to thrive: Secondary | ICD-10-CM

## 2022-06-17 DIAGNOSIS — R1310 Dysphagia, unspecified: Secondary | ICD-10-CM

## 2022-06-17 DIAGNOSIS — R2689 Other abnormalities of gait and mobility: Secondary | ICD-10-CM

## 2022-06-17 HISTORY — DX: Anorexia: R63.0

## 2022-06-17 HISTORY — DX: Esophageal obstruction: K22.2

## 2022-06-17 HISTORY — DX: Abnormal findings on diagnostic imaging of other parts of digestive tract: R93.3

## 2022-06-17 HISTORY — DX: Other dysphagia: R13.19

## 2022-06-17 LAB — GLUCOSE, CAPILLARY
Glucose-Capillary: 162 mg/dL — ABNORMAL HIGH (ref 70–99)
Glucose-Capillary: 140 mg/dL — ABNORMAL HIGH (ref 70–99)
Glucose-Capillary: 239 mg/dL — ABNORMAL HIGH (ref 70–99)
Glucose-Capillary: 131 mg/dL — ABNORMAL HIGH (ref 70–99)

## 2022-06-17 NOTE — Progress Notes (Signed)
Gastroenterology Inpatient Follow-up Note   PATIENT IDENTIFICATION  Lisa Maxwell is a 77 y.o. female Hospital Day: 9  SUBJECTIVE  The patient's chart has been reviewed. The patient's labs have been reviewed. The patient is evaluated with her husband at bedside. The patient denies fevers or chills. The patient denies any abdominal pain. I have told both of them that due to as well as due to acuity of care needs, that the patient would be scheduled for her endoscopy tomorrow instead of today. No other significant changes from a GI perspective other than a persisting decreased appetite.   OBJECTIVE  Scheduled Inpatient Medications:   docusate sodium  100 mg Oral Daily   feeding supplement  237 mL Oral TID BM   ferrous sulfate  325 mg Oral Q breakfast   fluticasone furoate-vilanterol  1-2 puff Inhalation Daily   gabapentin  600 mg Oral QHS   insulin aspart  0-5 Units Subcutaneous QHS   insulin aspart  0-9 Units Subcutaneous TID WC   latanoprost  1 drop Both Eyes QHS   magnesium oxide  400 mg Oral BID   multivitamin with minerals  1 tablet Oral Daily   pantoprazole  40 mg Oral Daily   pravastatin  40 mg Oral q1800   sodium chloride flush  3 mL Intravenous Q12H   Continuous Inpatient Infusions:   sodium chloride     PRN Inpatient Medications: sodium chloride, acetaminophen **OR** acetaminophen, albuterol, bisacodyl, HYDROcodone-acetaminophen, ondansetron **OR** ondansetron (ZOFRAN) IV, polyethylene glycol, sodium chloride flush, white petrolatum   Physical Examination  Temp:  [97.7 F (36.5 C)-98.3 F (36.8 C)] 97.7 F (36.5 C) (03/13 0845) Pulse Rate:  [72-107] 72 (03/13 0845) Resp:  [17-20] 18 (03/13 0845) BP: (103-140)/(70-104) 129/104 (03/13 0845) SpO2:  [93 %-97 %] 96 % (03/13 0845) Temp (24hrs), Avg:98 F (36.7 C), Min:97.7 F (36.5 C), Max:98.3 F (36.8 C)  Weight: 71.2 kg GEN: NAD, appears older than stated age, appears chronically ill, husband at  bedside PSYCH: Cooperative, without pressured speech EYE: Conjunctivae pink, sclerae anicteric ENT: MMM CV: Nontachycardic RESP: No audible wheezing GI: NABS, soft, NT/ND, without rebound MSK/EXT: No significant lower extremity edema SKIN: No jaundice NEURO:  Alert & Oriented x 3   Review of Data   Laboratory Studies   Recent Labs  Lab 06/14/22 0403  NA 137  K 4.3  CL 105  CO2 24  BUN 16  CREATININE 1.10*  GLUCOSE 134*  CALCIUM 10.2  MG 2.2   No results for input(s): "AST", "ALT", "GGT", "ALKPHOS" in the last 168 hours.  Invalid input(s): "TBILI", "CONJBILI", "ALB"  Recent Labs  Lab 06/11/22 0714 06/14/22 0403  WBC 6.2 6.6  HGB 13.5 14.0  HCT 42.2 42.6  PLT 187 180   No results for input(s): "APTT", "INR" in the last 168 hours.  Imaging Studies  No results found. No new imaging studies to review  GI Procedures and Studies  No new studies to review   ASSESSMENT  Ms. Lisa Maxwell is a 77 y.o. female with a pmh significant for atrial fibrillation (on Eliquis), prior CVA, prior PE, diabetes, CRI, COPD, hypertension, hyperlipidemia, obesity lumbar stenosis.  Admitted with progressive ambulatory dysfunction and endplate fracture and found to have imaging concerning for esophageal dysfunction and stricture with ongoing dysphagia and failure to thrive with anorexia.  I certainly think that we can hopefully help her esophageal dysphagia by trying to stretch the reported stricture/ring pending what is found.  If the narrowing is quite  significant, we will try to dilate that over a few sessions.  We will also evaluate for ulcer disease or esophagitis or other etiologies for her decreased appetite.  Otherwise SLP evaluation will be most helpful after her stricture/ring has been treated.  The risks and benefits of endoscopic evaluation were discussed with the patient; these include but are not limited to the risk of perforation, infection, bleeding, missed lesions, lack of  diagnosis, severe illness requiring hospitalization, as well as anesthesia and sedation related illnesses.  The patient and/or family is agreeable to proceed.  All patient questions were answered to the best of my ability, and the patient agrees to the aforementioned plan of action with follow-up as indicated.  Plan will be for endoscopy tomorrow.   PLAN/RECOMMENDATIONS  Patient may be return to prior diet today N.p.o. at midnight EGD with dilation scheduled for 3/14 Continue to hold Eliquis or if anticoagulation is needed, may utilize IV heparin and stop at 3 AM on 3/14   Please page/call with questions or concerns.   Justice Britain, MD [facility] Office # [phone]    LOS: 6 days  Irving Copas  06/17/2022, 11:17 AM

## 2022-06-17 NOTE — Progress Notes (Signed)
SLP Cancellation Note  Patient Details Name: SANJUANITA HOFFMEYER MRN: [id number] DOB: 27-Apr-1945   Cancelled treatment:        Pt is NPO for EGD at this time and is not appropriate for PO trials.  SLP to follow up for dysphagia management s/p GI intervention.   Celedonio Savage, MA, [place] Office: [phone] 06/17/2022, 10:24 AM

## 2022-06-17 NOTE — Progress Notes (Signed)
Physical Therapy Treatment Patient Details Name: Lisa Maxwell MRN: [id number] DOB: December 13, 1945 Today's Date: 06/17/2022   History of Present Illness 77 y.o. female who was admitted 06/09/22 for L foot pain and weakness with hx of recent fall. Pt found to have L1 fracture. PMH: A-fib on Eliquis, HTN, COPD, diabetes, HLD, obesity, history of pulmonary embolism on Eliquis, CAD, history of cauda equina syndrome bilateral low back pain with sciatica, CKD, chronic pain syndrome PVD.    PT Comments    Pt much improved, demonstrating increased alertness and cognition (although still confused and has difficulty following 2 step commands). Pt spouse reporting pt with "best day in the hospital yet." Michaelyn Barter for transfer training and functional strengthening. Pt able to complete 5 sit to stands, progressing from modA to min guard. Transferred to chair. Continue to recommend SNF for ongoing Physical Therapy.      Recommendations for follow up therapy are one component of a multi-disciplinary discharge planning process, led by the attending physician.  Recommendations may be updated based on patient status, additional functional criteria and insurance authorization.  Follow Up Recommendations  Skilled nursing-short term rehab (<3 hours/day) Can patient physically be transported by private vehicle: No   Assistance Recommended at Discharge Frequent or constant Supervision/Assistance  Patient can return home with the following Two people to help with walking and/or transfers;Two people to help with bathing/dressing/bathroom   Equipment Recommendations  None recommended by PT (has all needed DME)    Recommendations for Other Services       Precautions / Restrictions Precautions Precautions: Fall;Back;Other (comment) Precaution Booklet Issued: No Precaution Comments: TLSO brace - may remove to shower; R foot drop - AFO in room Restrictions Weight Bearing Restrictions: No     Mobility  Bed  Mobility Overal bed mobility: Needs Assistance Bed Mobility: Supine to Sit     Supine to sit: Mod assist     General bed mobility comments: Pt initiating with increased time, visual cueing for use of bed rail, assist to execute    Transfers Overall transfer level: Needs assistance Equipment used: Ambulation equipment used Transfers: Sit to/from Stand Sit to Stand: Mod assist, Min guard, +2 physical assistance           General transfer comment: Pt modA + 2 to rise from edge of bed to [place]. Manual placement of R foot on foot pad.  Subsequently able to stand from [place] with min guard assist. Transfer via Lift Equipment: [place]  Ambulation/Gait                   Stairs             Wheelchair Mobility    Modified Rankin (Stroke Patients Only)       Balance Overall balance assessment: Needs assistance Sitting-balance support: Feet supported, Bilateral upper extremity supported Sitting balance-Leahy Scale: Fair     Standing balance support: Bilateral upper extremity supported, Reliant on assistive device for balance Standing balance-Leahy Scale: Poor                              Cognition Arousal/Alertness: Awake/alert Behavior During Therapy: WFL for tasks assessed/performed Overall Cognitive Status: Impaired/Different from baseline Area of Impairment: Attention, Following commands, Awareness, Problem solving, Memory                   Current Attention Level: Sustained Memory: Decreased recall of precautions Following Commands: Follows one step  commands consistently, Follows one step commands with increased time, Follows multi-step commands inconsistently   Awareness: Emergent Problem Solving: Slow processing, Difficulty sequencing, Requires verbal cues, Requires tactile cues, Decreased initiation General Comments: More alert, difficulty following 2 step commands, easily distractable and fidgeting. Able to redirect         Exercises Other Exercises Other Exercises: x5 sit to stands in [employer]    General Comments        Pertinent Vitals/Pain Pain Assessment Pain Assessment: Faces Faces Pain Scale: Hurts even more Pain Location: generalized, back Pain Descriptors / Indicators: Discomfort, Grimacing, Guarding Pain Intervention(s): Limited activity within patient's tolerance, Monitored during session    Home Living                          Prior Function            PT Goals (current goals can now be found in the care plan section) Acute Rehab PT Goals Patient Stated Goal: to improve Potential to Achieve Goals: Fair Progress towards PT goals: Progressing toward goals    Frequency    Min 3X/week      PT Plan Current plan remains appropriate    Co-evaluation              AM-PAC PT "6 Clicks" Mobility   Outcome Measure  Help needed turning from your back to your side while in a flat bed without using bedrails?: A Lot Help needed moving from lying on your back to sitting on the side of a flat bed without using bedrails?: A Lot Help needed moving to and from a bed to a chair (including a wheelchair)?: Total Help needed standing up from a chair using your arms (e.g., wheelchair or bedside chair)?: Total Help needed to walk in hospital room?: Total Help needed climbing 3-5 steps with a railing? : Total 6 Click Score: 8    End of Session Equipment Utilized During Treatment: Gait belt;Back brace;Other (comment) (R AFO) Activity Tolerance: Patient tolerated treatment well Patient left: in chair;with call bell/phone within reach;with chair alarm set;with family/visitor present Nurse Communication: Mobility status PT Visit Diagnosis: Unsteadiness on feet (R26.81);Other abnormalities of gait and mobility (R26.89);Muscle weakness (generalized) (M62.81);History of falling (Z91.81);Difficulty in walking, not elsewhere classified (R26.2);Pain     Time: [id number] PT Time  Calculation (min) (ACUTE ONLY): 24 min  Charges:  $Therapeutic Activity: 23-37 mins                     Wyona Almas, PT, DPT Acute Rehabilitation Services Office [place] 06/17/2022, 4:44 PM

## 2022-06-17 NOTE — Plan of Care (Signed)
  Problem: Coping: Goal: Ability to adjust to condition or change in health will improve Outcome: Progressing   Problem: Fluid Volume: Goal: Ability to maintain a balanced intake and output will improve Outcome: Progressing   Problem: Nutritional: Goal: Maintenance of adequate nutrition will improve Outcome: Progressing   Problem: Skin Integrity: Goal: Risk for impaired skin integrity will decrease Outcome: Progressing   Problem: Education: Goal: Knowledge of General Education information will improve Description: Including pain rating scale, medication(s)/side effects and non-pharmacologic comfort measures Outcome: Progressing   Problem: Health Behavior/Discharge Planning: Goal: Ability to manage health-related needs will improve Outcome: Not Progressing   Problem: Metabolic: Goal: Ability to maintain appropriate glucose levels will improve Outcome: Not Progressing

## 2022-06-17 NOTE — Plan of Care (Signed)

## 2022-06-17 NOTE — Progress Notes (Signed)
PROGRESS NOTE    Lisa Maxwell  [id number] DOB: 12-Feb-1946 DOA: 06/09/2022 PCP: Nicoletta Dress, MD    Brief Narrative:   Lisa Maxwell is a 77 y.o. female with past medical history of atrial fibrillation on Eliquis, hypertension, COPD, diabetes mellitus type 2, hyperlipidemia, obesity, history of pulmonary embolism on Eliquis, CAD,  status post low back pain and sciatica, chronic pain syndrome on spinal stimulator and peripheral vascular disease presented to hospital with left foot pain and weakness.  Patient already has right foot weakness and is on a brace for at least a week now but now both legs were hurting.  Patient did have a fall a week back when she slumped against the wall and slid down and started having severe foot pain on the left.  She uses walker at baseline but has been wheelchair-bound at this time. In the ED, patient had mild tachycardia.  She did have mild leukocytosis at 11.7.  Chemistry showed creatinine at 1.4.  CT scan of the lumbar spine showed L1 inferior endplate fracture, CT head was nonacute.  Chest x-ray was negative.  She was given IV Dilaudid, neurosurgery was consulted and patient was admitted hospital for further evaluation and treatment.   TSLO brace has been prescribed by neurosurgery.  Patient had abnormal esophageal findings on CT scan and barium swallow was done which showed tertiary contractions with difficulty passing the barium tablet, patient having more dysphagia today so GI has been consulted. At this time,  patient is awaiting for skilled nursing facility placement pending GI evaluation..   Assessment and plan.  L1 inferior endplate fracture with back and foot pain with ambulatory dysfunction.   Has been more wheelchair-bound at this time.  Continue analgesics.  Physical therapy evaluation recommend skilled nursing facility.  Neurosurgery has recommended putting the patient in TSL0 brace and pain management with outpatient neurosurgery follow-up.   Physical therapy has recommended skilled nursing facility placement on discharge.  Patient does have a spinal cord stimulator in place which will likely minimize the need of narcotics.   Esophageal abnormality Barium swallow examination showed a narrowing of the distal esophagus with tertiary contractions.  Barium tablet was unable to pass through the lower esophageal junction.  Seen by speech therapy who recommended GI evaluation for abnormal findings.  Communicated with GI for consultation and plan for EGD on 06/18/2022.  Continue pantoprazole.  Confusion, agitation during the nighttime.  Needed Haldol and sitter.  Likely sundowning. .  Continue delirium precautions.  Have Discontinued fentanyl.,decreased the doses of Norco.  Continue spinal cord stimulator.  Appears to be much more alert awake and communicative this morning.  CKD (chronic kidney disease) stage 3b Continue to monitor BMP.  Latest creatinine at 1.1.  Mild hypomagnesemia.  Replenished.  Latest magnesium level of 2.2.   CAD (coronary artery disease) Continue Mevacor, aspirin, statin, beta-blocker and Plavix as outpatient.   Essential hypertension Slightly elevated blood pressure this morning.  Will continue to monitor closely.   Diabetes mellitus due to underlying condition with unspecified complications Continue sliding scale insulin, Accu-Cheks, diabetic diet.  Hold oral medications.  Latest POC glucose of 162.   Mixed dyslipidemia Continue Mevacor    Morbid obesity  Body mass index is 35.2 kg/m.  Would benefit from weight loss as outpatient.   PAF (paroxysmal atrial fibrillation) (HCC) CHA2DS2-VASc score of 8.  Continue Eliquis.  Controlled.  Patient is not taking beta-blocker.   Chronic obstructive lung disease ([place]) Appears compensated.  Continue bronchodilators.  Follow up with pulmonary as outpatient.  Not on oxygen at baseline.   History of DVT/pulmonary embolus (PE) Currently Eliquis on hold for potential  EGD tomorrow.    Foraminal stenosis of lumbar region with chronic pain syndrome. Continue analgesia.  Continue gabapentin and decrease dose of Norco.  Patient does have a spinal cord stimulator  OSA (obstructive sleep apnea) CPAP    DVT prophylaxis:  Was on Eliquis, on hold for EGD tomorrow  Code Status:     Code Status: Full Code  Disposition:  PT, OT has recommended skilled nursing facility placement at this time.  Disposition in 1 to 2 days.  Await EGD.  Status is: Inpatient  The is inpatient because:  ambulatory dysfunction,  need for rehabilitation, plan for EGD   Family Communication:  I spoke with the patient's husband at the bedside on 3/132024.  Consultants:  Neurosurgery GI  Procedures:  TSLO brace application  Antimicrobials:  None  Anti-infectives (From admission, onward)    None      Subjective: Today, patient was seen and examined at bedside.  Appears to be more alert awake and Communicative.  On the phone.  Patient's husband at bedside.  Denies any pain, nausea, vomiting.   Objective: Vitals:   06/16/22 1956 06/16/22 2352 06/17/22 0314 06/17/22 0845  BP: (!) 140/97 133/80 125/70 (!) 129/104  Pulse: 98 (!) 107 89 72  Resp: '17 18 18 18  '$ Temp: 97.8 F (36.6 C) 98.3 F (36.8 C) 98.2 F (36.8 C) 97.7 F (36.5 C)  TempSrc: Axillary Oral Oral Oral  SpO2: 97% 94% 96% 96%  Weight:      Height:        Intake/Output Summary (Last 24 hours) at 06/17/2022 1038 Last data filed at 06/16/2022 1700 Gross per 24 hour  Intake 200 ml  Output 600 ml  Net -400 ml    Filed Weights   06/09/22 1041  Weight: 71.2 kg    Physical Examination: Body mass index is 35.2 kg/m.   General: Obese, elderly female, appears to be more alert awake and Communicative. HENT:   No scleral pallor or icterus noted. Oral mucosa is moist.  Oral cavity with retained material. Chest:  Clear breath sounds.  No crackles or wheezes.   CVS: S1 &S2 heard. No murmur.  Regular  rate and rhythm. Abdomen: Soft, nontender, nondistended.  Bowel sounds are heard.   Extremities: No cyanosis, clubbing with bilateral trace lower extremity edema, moves extremities Psych: Alert awake and Communicative but impaired memory, oriented to place and person, CNS: Moves all extremities.  Alert awake and Communicative today. Skin: Warm and dry.  No rashes noted.  Data Reviewed:   CBC: Recent Labs  Lab 06/11/22 0714 06/14/22 0403  WBC 6.2 6.6  HGB 13.5 14.0  HCT 42.2 42.6  MCV 95.5 93.4  PLT 187 180     Basic Metabolic Panel: Recent Labs  Lab 06/11/22 0714 06/13/22 0356 06/14/22 0403  NA 137 135 137  K 3.8 4.4 4.3  CL 107 107 105  CO2 '22 22 24  '$ GLUCOSE 124* 184* 134*  BUN '14 21 16  '$ CREATININE 1.12* 1.18* 1.10*  CALCIUM 10.1 9.9 10.2  MG 1.6* 1.6* 2.2     Liver Function Tests: No results for input(s): "AST", "ALT", "ALKPHOS", "BILITOT", "PROT", "ALBUMIN" in the last 168 hours.    Radiology Studies: No results found.    LOS: 6 days    Flora Lipps, MD [facility] Available via Epic secure chat  7am-7pm After these hours, please refer to coverage provider listed on [email or web address] 06/17/2022, 10:38 AM

## 2022-06-17 NOTE — Progress Notes (Signed)
Nutrition Follow-up  DOCUMENTATION CODES:   Not applicable  INTERVENTION:  -Educated patient and spouse on adequate nutrition throughout hospital course  -informed patient and spouse about diet changes  -continue Ensure TID   NUTRITION DIAGNOSIS:   Inadequate oral intake related to poor appetite as evidenced by per patient/family report.   GOAL:   Patient will meet greater than or equal to 90% of their needs - goal unmet   MONITOR:   PO intake, Diet advancement, Labs  REASON FOR ASSESSMENT:   Consult Assessment of nutrition requirement/status  ASSESSMENT:   Pt with hx of PAF, HTN, HLD, COPD, DM type 2, CAD, PVD, and CKD3 presented to ED with left foot pain and weakness.  Visited patient who was sitting in the chair. Her husband was present ordering her pizza for dinner per patient's request. Plans for patient to get an EGD tomorrow and will be NPO at midnight. Patient's spouse reports  that she has not eaten all day because she was supposed to get an EGD today. Patient's spouse states that patient's dementia has been worse and causing her to not have an appetite. Patient has also been coughing a lot and was coughing during follow up. RD gave her sips of Ensure and water.   Patient had an MBS and SLP noted that the barium pill did not pass through the lower esophageal sphincter.   Patient reports she does not always drink the Ensure. Her husband reports she is experiencing discomfort and early satiety possibly related to esophageal abnormality.   Patient denies nausea, vomiting and diarrhea. She reports constipation.   Labs: Glu 134  Meds: colace, ferrous sulfate, novolog, mag-ox, MVI, protonix, NS, Zofran PRN  Po intake: 29% avg po x 8 meals  Wt: unable to determine weight changes; no new weights since admission   Diet Order:   Diet Order             Diet NPO time specified  Diet effective midnight           Diet Carb Modified Fluid consistency: Thin; Room  service appropriate? Yes  Diet effective now                   EDUCATION NEEDS:   Education needs have been addressed  Skin:  Skin Assessment: Reviewed RN Assessment  Last BM:  3/10  Height:   Ht Readings from Last 1 Encounters:  06/09/22 '4\' 8"'$  (1.422 m)    Weight:   Wt Readings from Last 1 Encounters:  06/09/22 71.2 kg    BMI:  Body mass index is 35.2 kg/m.  Estimated Nutritional Needs:   Kcal:  1300-1500 kcal/d  Protein:  60-80 g/d  Fluid:  >/=1.5L/d    Trey Paula, RDN, LDN  Clinical Nutrition

## 2022-06-17 NOTE — H&P (View-Only) (Signed)
Gastroenterology Inpatient Follow-up Note   PATIENT IDENTIFICATION  Lisa Maxwell is a 77 y.o. female Hospital Day: 9  SUBJECTIVE  The patient's chart has been reviewed. The patient's labs have been reviewed. The patient is evaluated with her husband at bedside. The patient denies fevers or chills. The patient denies any abdominal pain. I have told both of them that due to as well as due to acuity of care needs, that the patient would be scheduled for her endoscopy tomorrow instead of today. No other significant changes from a GI perspective other than a persisting decreased appetite.   OBJECTIVE  Scheduled Inpatient Medications:   docusate sodium  100 mg Oral Daily   feeding supplement  237 mL Oral TID BM   ferrous sulfate  325 mg Oral Q breakfast   fluticasone furoate-vilanterol  1-2 puff Inhalation Daily   gabapentin  600 mg Oral QHS   insulin aspart  0-5 Units Subcutaneous QHS   insulin aspart  0-9 Units Subcutaneous TID WC   latanoprost  1 drop Both Eyes QHS   magnesium oxide  400 mg Oral BID   multivitamin with minerals  1 tablet Oral Daily   pantoprazole  40 mg Oral Daily   pravastatin  40 mg Oral q1800   sodium chloride flush  3 mL Intravenous Q12H   Continuous Inpatient Infusions:   sodium chloride     PRN Inpatient Medications: sodium chloride, acetaminophen **OR** acetaminophen, albuterol, bisacodyl, HYDROcodone-acetaminophen, ondansetron **OR** ondansetron (ZOFRAN) IV, polyethylene glycol, sodium chloride flush, white petrolatum   Physical Examination  Temp:  [97.7 F (36.5 C)-98.3 F (36.8 C)] 97.7 F (36.5 C) (03/13 0845) Pulse Rate:  [72-107] 72 (03/13 0845) Resp:  [17-20] 18 (03/13 0845) BP: (103-140)/(70-104) 129/104 (03/13 0845) SpO2:  [93 %-97 %] 96 % (03/13 0845) Temp (24hrs), Avg:98 F (36.7 C), Min:97.7 F (36.5 C), Max:98.3 F (36.8 C)  Weight: 71.2 kg GEN: NAD, appears older than stated age, appears chronically ill, husband at  bedside PSYCH: Cooperative, without pressured speech EYE: Conjunctivae pink, sclerae anicteric ENT: MMM CV: Nontachycardic RESP: No audible wheezing GI: NABS, soft, NT/ND, without rebound MSK/EXT: No significant lower extremity edema SKIN: No jaundice NEURO:  Alert & Oriented x 3   Review of Data   Laboratory Studies   Recent Labs  Lab 06/14/22 0403  NA 137  K 4.3  CL 105  CO2 24  BUN 16  CREATININE 1.10*  GLUCOSE 134*  CALCIUM 10.2  MG 2.2   No results for input(s): "AST", "ALT", "GGT", "ALKPHOS" in the last 168 hours.  Invalid input(s): "TBILI", "CONJBILI", "ALB"  Recent Labs  Lab 06/11/22 0714 06/14/22 0403  WBC 6.2 6.6  HGB 13.5 14.0  HCT 42.2 42.6  PLT 187 180   No results for input(s): "APTT", "INR" in the last 168 hours.  Imaging Studies  No results found. No new imaging studies to review  GI Procedures and Studies  No new studies to review   ASSESSMENT  [name] is a 77 y.o. female with a pmh significant for atrial fibrillation (on Eliquis), prior CVA, prior PE, diabetes, CRI, COPD, hypertension, hyperlipidemia, obesity lumbar stenosis.  Admitted with progressive ambulatory dysfunction and endplate fracture and found to have imaging concerning for esophageal dysfunction and stricture with ongoing dysphagia and failure to thrive with anorexia.  I certainly think that we can hopefully help her esophageal dysphagia by trying to stretch the reported stricture/ring pending what is found.  If the narrowing is quite  significant, we will try to dilate that over a few sessions.  We will also evaluate for ulcer disease or esophagitis or other etiologies for her decreased appetite.  Otherwise SLP evaluation will be most helpful after her stricture/ring has been treated.  The risks and benefits of endoscopic evaluation were discussed with the patient; these include but are not limited to the risk of perforation, infection, bleeding, missed lesions, lack of  diagnosis, severe illness requiring hospitalization, as well as anesthesia and sedation related illnesses.  The patient and/or family is agreeable to proceed.  All patient questions were answered to the best of my ability, and the patient agrees to the aforementioned plan of action with follow-up as indicated.  Plan will be for endoscopy tomorrow.   PLAN/RECOMMENDATIONS  Patient may be return to prior diet today N.p.o. at midnight EGD with dilation scheduled for 3/14 Continue to hold Eliquis or if anticoagulation is needed, may utilize IV heparin and stop at 3 AM on 3/14   Please page/call with questions or concerns.   Justice Britain, MD [facility] Office # [phone]    LOS: 6 days  Irving Copas  06/17/2022, 11:17 AM

## 2022-06-18 ENCOUNTER — Inpatient Hospital Stay (HOSPITAL_COMMUNITY): Payer: Medicare HMO | Admitting: Registered Nurse

## 2022-06-18 ENCOUNTER — Encounter (HOSPITAL_COMMUNITY)
Admission: EM | Disposition: A | Discharge status: Skilled Nursing Facility | Payer: Self-pay | Source: Home / Self Care | Attending: Internal Medicine

## 2022-06-18 ENCOUNTER — Encounter (HOSPITAL_COMMUNITY): Payer: Self-pay | Admitting: Internal Medicine

## 2022-06-18 DIAGNOSIS — K449 Diaphragmatic hernia without obstruction or gangrene: Secondary | ICD-10-CM

## 2022-06-18 DIAGNOSIS — E1151 Type 2 diabetes mellitus with diabetic peripheral angiopathy without gangrene: Secondary | ICD-10-CM

## 2022-06-18 DIAGNOSIS — K2289 Other specified disease of esophagus: Secondary | ICD-10-CM

## 2022-06-18 DIAGNOSIS — J449 Chronic obstructive pulmonary disease, unspecified: Secondary | ICD-10-CM | POA: Diagnosis not present

## 2022-06-18 DIAGNOSIS — Z87891 Personal history of nicotine dependence: Secondary | ICD-10-CM

## 2022-06-18 DIAGNOSIS — S32019A Unspecified fracture of first lumbar vertebra, initial encounter for closed fracture: Secondary | ICD-10-CM | POA: Diagnosis not present

## 2022-06-18 DIAGNOSIS — I2510 Atherosclerotic heart disease of native coronary artery without angina pectoris: Secondary | ICD-10-CM | POA: Diagnosis not present

## 2022-06-18 DIAGNOSIS — K2270 Barrett's esophagus without dysplasia: Secondary | ICD-10-CM

## 2022-06-18 DIAGNOSIS — K3189 Other diseases of stomach and duodenum: Secondary | ICD-10-CM

## 2022-06-18 DIAGNOSIS — N1832 Chronic kidney disease, stage 3b: Secondary | ICD-10-CM | POA: Diagnosis not present

## 2022-06-18 DIAGNOSIS — K222 Esophageal obstruction: Secondary | ICD-10-CM

## 2022-06-18 HISTORY — PX: BIOPSY: SHX5522

## 2022-06-18 HISTORY — PX: SAVORY DILATION: SHX5439

## 2022-06-18 HISTORY — PX: ESOPHAGOGASTRODUODENOSCOPY (EGD) WITH PROPOFOL: SHX5813

## 2022-06-18 LAB — CBC
HCT: 48.5 % — ABNORMAL HIGH (ref 36.0–46.0)
Hemoglobin: 15.8 g/dL — ABNORMAL HIGH (ref 12.0–15.0)
MCH: 30.3 pg (ref 26.0–34.0)
MCHC: 32.6 g/dL (ref 30.0–36.0)
MCV: 92.9 fL (ref 80.0–100.0)
Platelets: 201 10*3/uL (ref 150–400)
RBC: 5.22 MIL/uL — ABNORMAL HIGH (ref 3.87–5.11)
RDW: 12.9 % (ref 11.5–15.5)
WBC: 6.5 10*3/uL (ref 4.0–10.5)
nRBC: 0 % (ref 0.0–0.2)

## 2022-06-18 LAB — GLUCOSE, CAPILLARY
Glucose-Capillary: 131 mg/dL — ABNORMAL HIGH (ref 70–99)
Glucose-Capillary: 140 mg/dL — ABNORMAL HIGH (ref 70–99)
Glucose-Capillary: 142 mg/dL — ABNORMAL HIGH (ref 70–99)
Glucose-Capillary: 144 mg/dL — ABNORMAL HIGH (ref 70–99)

## 2022-06-18 LAB — BASIC METABOLIC PANEL
Anion gap: 10 (ref 5–15)
BUN: 30 mg/dL — ABNORMAL HIGH (ref 8–23)
CO2: 26 mmol/L (ref 22–32)
Calcium: 11.2 mg/dL — ABNORMAL HIGH (ref 8.9–10.3)
Chloride: 101 mmol/L (ref 98–111)
Creatinine, Ser: 1.38 mg/dL — ABNORMAL HIGH (ref 0.44–1.00)
GFR, Estimated: 40 mL/min — ABNORMAL LOW (ref >60)
Glucose, Bld: 138 mg/dL — ABNORMAL HIGH (ref 70–99)
Potassium: 4.4 mmol/L (ref 3.5–5.1)
Sodium: 137 mmol/L (ref 135–145)

## 2022-06-18 LAB — MAGNESIUM: Magnesium: 2.3 mg/dL (ref 1.7–2.4)

## 2022-06-18 SURGERY — ESOPHAGOGASTRODUODENOSCOPY (EGD) WITH PROPOFOL
Anesthesia: Monitor Anesthesia Care

## 2022-06-18 MED ORDER — PANTOPRAZOLE SODIUM 40 MG PO TBEC
40.0000 mg | DELAYED_RELEASE_TABLET | Freq: Two times a day (BID) | ORAL | Status: DC
  Administered 2022-06-18 – 2022-06-20 (×4): 40 mg via ORAL
  Filled 2022-06-18 (×4): qty 1

## 2022-06-18 MED ORDER — PROPOFOL 10 MG/ML IV BOLUS
INTRAVENOUS | Status: DC | PRN
  Administered 2022-06-18: 40 mg via INTRAVENOUS
  Administered 2022-06-18: 75 ug/kg/min via INTRAVENOUS

## 2022-06-18 MED ORDER — LIDOCAINE 2% (20 MG/ML) 5 ML SYRINGE
INTRAMUSCULAR | Status: DC | PRN
  Administered 2022-06-18: 60 mg via INTRAVENOUS

## 2022-06-18 MED ORDER — LACTATED RINGERS IV SOLN: INTRAVENOUS | Status: DC

## 2022-06-18 MED ORDER — SODIUM CHLORIDE 0.9 % IV SOLN: INTRAVENOUS | Status: DC

## 2022-06-18 SURGICAL SUPPLY — 15 items

## 2022-06-18 NOTE — Transfer of Care (Signed)
Immediate Anesthesia Transfer of Care Note  Patient: Lisa Maxwell  Procedure(s) Performed: ESOPHAGOGASTRODUODENOSCOPY (EGD) WITH PROPOFOL BIOPSY SAVORY DILATION  Patient Location: PACU  Anesthesia Type:MAC  Level of Consciousness: drowsy and patient cooperative  Airway & Oxygen Therapy: Patient Spontanous Breathing  Post-op Assessment: Report given to RN and Post -op Vital signs reviewed and stable  Post vital signs: Reviewed and stable  Last Vitals:  Vitals Value Taken Time  BP    Temp    Pulse 118 06/18/22 1334  Resp    SpO2 93 % 06/18/22 1334  Vitals shown include unvalidated device data.  Last Pain:  Vitals:   06/18/22 1212  TempSrc: Temporal  PainSc: 0-No pain      Patients Stated Pain Goal: 3 ([id number])  Complications: There were no known notable events for this encounter.

## 2022-06-18 NOTE — Op Note (Addendum)
[facility] Patient Name: Lisa Maxwell Procedure Date : 06/18/2022 MRN: [id number] Attending MD: Justice Britain , MD, [id number] Date of Birth: 01-May-1945 CSN: [id number] Age: 77 Admit Type: Inpatient Procedure:                Upper GI endoscopy Indications:              Dysphagia, Abnormal UGI series, Anorexia, Failure                            to thrive Providers:                Justice Britain, MD, Dulcy Fanny, Frazier Richards, Technician Referring MD:             Inpatient medical service Medicines:                Monitored Anesthesia Care Complications:            No immediate complications. Estimated Blood Loss:     Estimated blood loss was minimal. Procedure:                Pre-Anesthesia Assessment:                           - Prior to the procedure, a History and Physical                            was performed, and patient medications and                            allergies were reviewed. The patient's tolerance of                            previous anesthesia was also reviewed. The risks                            and benefits of the procedure and the sedation                            options and risks were discussed with the patient.                            All questions were answered, and informed consent                            was obtained. Prior Anticoagulants: The patient has                            taken Eliquis (apixaban), last dose was 2 days                            prior to procedure. ASA Grade Assessment: III - A  patient with severe systemic disease. After                            reviewing the risks and benefits, the patient was                            deemed in satisfactory condition to undergo the                            procedure.                           After obtaining informed consent, the endoscope was                            passed under direct  vision. Throughout the                            procedure, the patient's blood pressure, pulse, and                            oxygen saturations were monitored continuously. The                            [id number] Olympus endoscope was introduced                            through the mouth, and advanced to the second part                            of duodenum. The upper GI endoscopy was                            accomplished without difficulty. The patient                            tolerated the procedure. Scope In: Scope Out: Findings:      No gross lesions were noted in the entire esophagus.      A non-obstructing Schatzki ring was found at the gastroesophageal       junction. Biopsies were taken with a cold forceps for disruption       purposes circumferentially around the ring.      The Z-line was regular and was found 35 cm from the incisors.      After the rest of the EGD was completed, A guidewire was placed and the       scope was withdrawn. Dilation was performed in the esophagus with a       Savary dilator with moderate resistance at 16 mm. The dilation site was       examined following endoscope reinsertion and showed mild mucosal       disruption/mucosal wrent just below the UES, mild improvement in luminal       narrowing and no perforation.      A 2 cm hiatal hernia was present.      Patchy mildly erythematous mucosa without bleeding was found in the  entire examined stomach. Biopsies were taken with a cold forceps for       histology and Helicobacter pylori testing.      No gross lesions were noted in the duodenal bulb, in the first portion       of the duodenum and in the second portion of the duodenum. Biopsies were       taken with a cold forceps for histology to rule out enteropathy. Impression:               - No gross lesions in the entire esophagus.                           - Non-obstructing Schatzki ring?disrupted.                            - Z-line regular, 35 cm from the incisors.                           - Savory dilation performed of the esophagus with                            mucosal rent just below the UES noted after 16 mm                            dilation.                           - 2 cm hiatal hernia.                           - Erythematous mucosa in the stomach. Biopsied.                           - No gross lesions in the duodenal bulb, in the                            first portion of the duodenum and in the second                            portion of the duodenum. Biopsied. Recommendation:           - The patient will be observed post-procedure,                            until all discharge criteria are met.                           - Return patient to hospital ward for ongoing care.                           - Patient has a contact number available for                            emergencies. The signs and symptoms of potential  delayed complications were discussed with the                            patient. Return to normal activities tomorrow.                            Written discharge instructions were provided to the                            patient.                           - Dysphagia diet may be resumed as per SLP                            evaluation.                           - Increase PPI to 40 mg twice daily for 2 weeks and                            then may decrease back to once daily.                           - May restart DOAC in 48 hours. If earlier                            anticoagulation is needed, consider IV heparin drip                            at 7 PM without bolus.                           - Observe patient's clinical course.                           - Await pathology results.                           - The inpatient GI service will sign off at this                            time. Please call if questions.                           -  The findings and recommendations were discussed                            with the patient.                           - The findings and recommendations were discussed                            with the referring physician. Procedure Code(s):        ---  Professional ---                           [phone], Esophagogastroduodenoscopy, flexible,                            transoral; with insertion of guide wire followed by                            passage of dilator(s) through esophagus over guide                            wire                           43239, 42, Esophagogastroduodenoscopy, flexible,                            transoral; with biopsy, single or multiple Diagnosis Code(s):        --- Professional ---                           K22.2, Esophageal obstruction                           K44.9, Diaphragmatic hernia without obstruction or                            gangrene                           K31.89, Other diseases of stomach and duodenum                           R13.10, Dysphagia, unspecified                           R63.0, Anorexia                           R62.7, Adult failure to thrive                           R93.3, Abnormal findings on diagnostic imaging of                            other parts of digestive tract CPT copyright 2022 [employer]. All rights reserved. The codes documented in this report are preliminary and upon coder review may  be revised to meet current compliance requirements. Justice Britain, MD 06/18/2022 1:44:37 PM Number of Addenda: 0

## 2022-06-18 NOTE — Progress Notes (Signed)
PROGRESS NOTE    [name]  [id number] DOB: January 20, 1946 DOA: 06/09/2022 PCP: Nicoletta Dress, MD    Brief Narrative:   Lisa Maxwell is a 77 y.o. female with past medical history of atrial fibrillation on Eliquis, hypertension, COPD, diabetes mellitus type 2, hyperlipidemia, obesity, history of pulmonary embolism on Eliquis, CAD,  status post low back pain and sciatica, chronic pain syndrome on spinal stimulator and peripheral vascular disease presented to hospital with left foot pain and weakness.  Patient already has right foot weakness and is on a brace for at least a week now but now both legs were hurting.  Patient did have a fall a week back when she slumped against the wall and slid down and started having severe foot pain on the left.  She uses walker at baseline but has been wheelchair-bound at this time. In the ED, patient had mild tachycardia.  She did have mild leukocytosis at 11.7.  Chemistry showed creatinine at 1.4.  CT scan of the lumbar spine showed L1 inferior endplate fracture, CT head was nonacute.  Chest x-ray was negative.  She was given IV Dilaudid, neurosurgery was consulted and patient was admitted hospital for further evaluation and treatment.   TSLO brace has been prescribed by neurosurgery.  Patient had abnormal esophageal findings on CT scan and barium swallow was done which showed tertiary contractions with difficulty passing the barium tablet and patient was having more dysphagia, so GI was consulted.  Plan is for EGD 06/18/2022. At this time,  patient is awaiting for skilled nursing facility placement and EGD evaluation.  Assessment and plan.  L1 inferior endplate fracture with back and foot pain with ambulatory dysfunction, deconditioning, debility.   Has been more wheelchair-bound at this time.  Continue analgesics trying to avoid narcotics if possible due to episodes of confusion delirium.[name]  Has a spinal cord stimulator.[name]  Physical therapy evaluation  recommend skilled nursing facility.  Neurosurgery has recommended putting the patient in TSL0 brace and pain management with outpatient neurosurgery follow-up.  Physical therapy has recommended skilled nursing facility placement on discharge.     Esophageal abnormality/dysphagia Barium swallow examination showed a narrowing of the distal esophagus with tertiary contractions.  Barium tablet was unable to pass through the lower esophageal junction.  Seen by speech therapy who recommended GI evaluation for abnormal findings.  GI has been consulted and plan for EGD with possible dilatation on 06/18/2022.  Continue pantoprazole.  Confusion, agitation during the nighttime.  Off fentanyl and on decreased dose of Norco.  Trying to minimize narcotics.  On spinal cord stimulator.  Continue spinal cord stimulator.  Alert awake and Communicative but disoriented.  CKD (chronic kidney disease) stage 3b Continue to monitor BMP.  Latest creatinine at 1.3.  Mild hypomagnesemia.  Replenished.  Latest magnesium level of 2.3.   CAD (coronary artery disease) Continue Mevacor, aspirin, statin, beta-blocker and Plavix as outpatient.   Essential hypertension Blood pressure much better today.   Diabetes mellitus due to underlying condition with unspecified complications Continue sliding scale insulin, Accu-Cheks, diabetic diet.  Hold oral medications.  Latest POC glucose of 162.   Mixed dyslipidemia Continue Mevacor    Morbid obesity  Body mass index is 35.2 kg/m.  Would benefit from weight loss as outpatient.   PAF (paroxysmal atrial fibrillation) (HCC) CHA2DS2-VASc score of 8.    Controlled.  Patient is not taking beta-blocker.  Eliquis on hold for EGD and esophageal dilatation.   Chronic obstructive lung disease ([place]) Appears  compensated.  Continue bronchodilators.  Follow up with pulmonary as outpatient.  Not on oxygen at baseline.   History of DVT/pulmonary embolus (PE) Currently Eliquis on hold for   EGD willing to transition back to Eliquis/heparin drip after the procedure.   Foraminal stenosis of lumbar region with chronic pain syndrome. Continue analgesia.  Continue gabapentin and decrease dose of Norco.  Patient does have a spinal cord stimulator in place.  Follows up with pain management as outpatient.  OSA (obstructive sleep apnea) CPAP    DVT prophylaxis:  Eliquis on hold at this time.  Code Status:     Code Status: Full Code  Disposition:  PT, OT has recommended skilled nursing facility placement at this time.  Disposition in 1 to 2 days.  Await EGD.  Status is: Inpatient  The is inpatient because:  ambulatory dysfunction,  need for rehabilitation, plan for EGD   Family Communication:  I spoke with the patient's husband at the bedside on 3/142024.  Consultants:  Neurosurgery GI  Procedures:  TSLO brace application  Antimicrobials:  None  Anti-infectives (From admission, onward)    None      Subjective: Today, patient was seen and examined at bedside.  Alert awake and Communicative but disoriented.  Denies pain, nausea, vomiting.  Patient's husband at bedside.  Objective: Vitals:   06/17/22 2342 06/18/22 0732 06/18/22 1134 06/18/22 1212  BP: (!) 125/108 99/77 (!) 149/84 104/64  Pulse: 80 96 100 97  Resp:  '17 16 17  '$ Temp:  97.8 F (36.6 C) 98.2 F (36.8 C) (!) 96.6 F (35.9 C)  TempSrc:  Axillary Axillary Temporal  SpO2: 99% 98% 97% 92%  Weight:      Height:        Intake/Output Summary (Last 24 hours) at 06/18/2022 1320 Last data filed at 06/17/2022 2043 Gross per 24 hour  Intake 323 ml  Output 400 ml  Net -77 ml    Filed Weights   06/09/22 1041  Weight: 71.2 kg    Physical Examination: Body mass index is 35.2 kg/m.   General: Obese, elderly female, alert awake and Communicative but disoriented, HENT:   No scleral pallor or icterus noted. Oral mucosa is moist.  Oral cavity with retained material. Chest:  C diminished breath sounds  bilaterally.  CVS: S1 &S2 heard. No murmur.  Regular rate and rhythm. Abdomen: Soft, nontender, nondistended.  Bowel sounds are heard.   Extremities: No cyanosis, clubbing with  trace lower extremity edema, moves extremities Psych: Alert awake and Communicative but impaired memory, oriented to place only, CNS: Moves all extremities.  Alert awake and mildly Communicative, Skin: Warm and dry.  No rashes noted.  Data Reviewed:   CBC: Recent Labs  Lab 06/14/22 0403 06/18/22 0612  WBC 6.6 6.5  HGB 14.0 15.8*  HCT 42.6 48.5*  MCV 93.4 92.9  PLT 180 201     Basic Metabolic Panel: Recent Labs  Lab 06/13/22 0356 06/14/22 0403 06/18/22 0305 06/18/22 0612  NA 135 137  --  137  K 4.4 4.3  --  4.4  CL 107 105  --  101  CO2 22 24  --  26  GLUCOSE 184* 134*  --  138*  BUN 21 16  --  30*  CREATININE 1.18* 1.10*  --  1.38*  CALCIUM 9.9 10.2  --  11.2*  MG 1.6* 2.2 2.3  --      Liver Function Tests: No results for input(s): "AST", "ALT", "ALKPHOS", "BILITOT", "PROT", "ALBUMIN"  in the last 168 hours.    Radiology Studies: No results found.    LOS: 7 days    Flora Lipps, MD [facility] Available via Epic secure chat 7am-7pm After these hours, please refer to coverage provider listed on [email or web address] 06/18/2022, 1:20 PM

## 2022-06-18 NOTE — Progress Notes (Signed)
Refused CPAP

## 2022-06-18 NOTE — Anesthesia Postprocedure Evaluation (Signed)
Anesthesia Post Note  Patient: Lisa Maxwell  Procedure(s) Performed: ESOPHAGOGASTRODUODENOSCOPY (EGD) WITH PROPOFOL BIOPSY SAVORY DILATION     Patient location during evaluation: PACU Anesthesia Type: MAC Level of consciousness: awake and alert Pain management: pain level controlled Vital Signs Assessment: post-procedure vital signs reviewed and stable Respiratory status: spontaneous breathing, nonlabored ventilation, respiratory function stable and patient connected to nasal cannula oxygen Cardiovascular status: stable and blood pressure returned to baseline Postop Assessment: no apparent nausea or vomiting Anesthetic complications: no  There were no known notable events for this encounter.  Last Vitals:  Vitals:   06/18/22 1335 06/18/22 1345  BP: 106/66 101/76  Pulse: (!) 118 97  Resp: 20 (!) 23  Temp: (!) 36.3 C   SpO2: 94% 93%    Last Pain:  Vitals:   06/18/22 1335  TempSrc:   PainSc: Asleep                 Mavrick Mcquigg S

## 2022-06-18 NOTE — Plan of Care (Signed)
  Problem: Fluid Volume: Goal: Ability to maintain a balanced intake and output will improve Outcome: Progressing   Problem: Clinical Measurements: Goal: Will remain free from infection Outcome: Progressing   Problem: Coping: Goal: Ability to adjust to condition or change in health will improve Outcome: Not Progressing   Problem: Health Behavior/Discharge Planning: Goal: Ability to manage health-related needs will improve Outcome: Not Progressing   Problem: Nutritional: Goal: Progress toward achieving an optimal weight will improve Outcome: Not Progressing   Problem: Skin Integrity: Goal: Risk for impaired skin integrity will decrease Outcome: Not Progressing

## 2022-06-18 NOTE — Progress Notes (Signed)
SLP Cancellation Note  Patient Details Name: Lisa Maxwell MRN: [id number] DOB: 02-Aug-1945   Cancelled treatment:        EGD rescheduled for today.  Pt NPO pending evaluation. Per GI note: "I certainly think that we can hopefully help her esophageal dysphagia by trying to stretch the reported stricture/ring pending what is found. If the narrowing is quite significant, we will try to dilate that over a few sessions. We will also evaluate for ulcer disease or esophagitis or other etiologies for her decreased appetite. Otherwise SLP evaluation will be most helpful after her stricture/ring has been treated."  SLP to follow up for reassessment of swallow post intervention.   Celedonio Savage, MA, [place] Office: [phone] 06/18/2022, 9:28 AM

## 2022-06-18 NOTE — Interval H&P Note (Signed)
History and Physical Interval Note:  06/18/2022 12:48 PM  Lisa Maxwell  has presented today for surgery, with the diagnosis of Dysphagia.  The various methods of treatment have been discussed with the patient and family. After consideration of risks, benefits and other options for treatment, the patient has consented to  Procedure(s): ESOPHAGOGASTRODUODENOSCOPY (EGD) WITH PROPOFOL (N/A) as a surgical intervention.  The patient's history has been reviewed, patient examined, no change in status, stable for surgery.  I have reviewed the patient's chart and labs.  Questions were answered to the patient's satisfaction.     [employer]

## 2022-06-18 NOTE — Anesthesia Preprocedure Evaluation (Addendum)
Anesthesia Evaluation  Patient identified by MRN, date of birth, ID band Patient awake    Reviewed: Allergy & Precautions, H&P , NPO status , Patient's Chart, lab work & pertinent test results  History of Anesthesia Complications Negative for: history of anesthetic complications  Airway Mallampati: II  TM Distance: >3 FB Neck ROM: Full    Dental  (+) Edentulous Upper, Edentulous Lower   Pulmonary sleep apnea , COPD, former smoker, PE (11/2021)   Pulmonary exam normal breath sounds clear to auscultation       Cardiovascular hypertension, + CAD and + Peripheral Vascular Disease  Normal cardiovascular exam+ dysrhythmias Atrial Fibrillation  Rhythm:Regular Rate:Normal     Neuro/Psych Chronic pain syndrome   negative psych ROS   GI/Hepatic Neg liver ROS,GERD  Medicated,,  Endo/Other  diabetes, Type 2    Renal/GU Renal InsufficiencyRenal disease  negative genitourinary   Musculoskeletal negative musculoskeletal ROS (+)    Abdominal   Peds negative pediatric ROS (+)  Hematology negative hematology ROS (+)   Anesthesia Other Findings   Reproductive/Obstetrics negative OB ROS                              Anesthesia Physical Anesthesia Plan  ASA: 3  Anesthesia Plan: MAC   Post-op Pain Management: Minimal or no pain anticipated   Induction: Intravenous  PONV Risk Score and Plan: 2 and Propofol infusion and Treatment may vary due to age or medical condition  Airway Management Planned: Simple Face Mask  Additional Equipment:   Intra-op Plan:   Post-operative Plan:   Informed Consent: I have reviewed the patients History and Physical, chart, labs and discussed the procedure including the risks, benefits and alternatives for the proposed anesthesia with the patient or authorized representative who has indicated his/her understanding and acceptance.     Dental advisory given  Plan  Discussed with: CRNA and Surgeon  Anesthesia Plan Comments:        Anesthesia Quick Evaluation

## 2022-06-19 DIAGNOSIS — S32019A Unspecified fracture of first lumbar vertebra, initial encounter for closed fracture: Secondary | ICD-10-CM | POA: Diagnosis not present

## 2022-06-19 LAB — CBC
HCT: 47 % — ABNORMAL HIGH (ref 36.0–46.0)
Hemoglobin: 14.9 g/dL (ref 12.0–15.0)
MCH: 29.9 pg (ref 26.0–34.0)
MCHC: 31.7 g/dL (ref 30.0–36.0)
MCV: 94.4 fL (ref 80.0–100.0)
Platelets: 215 10*3/uL (ref 150–400)
RBC: 4.98 MIL/uL (ref 3.87–5.11)
RDW: 12.9 % (ref 11.5–15.5)
WBC: 7.9 10*3/uL (ref 4.0–10.5)
nRBC: 0 % (ref 0.0–0.2)

## 2022-06-19 LAB — GLUCOSE, CAPILLARY
Glucose-Capillary: 141 mg/dL — ABNORMAL HIGH (ref 70–99)
Glucose-Capillary: 238 mg/dL — ABNORMAL HIGH (ref 70–99)
Glucose-Capillary: 107 mg/dL — ABNORMAL HIGH (ref 70–99)
Glucose-Capillary: 156 mg/dL — ABNORMAL HIGH (ref 70–99)

## 2022-06-19 LAB — BASIC METABOLIC PANEL
Anion gap: 9 (ref 5–15)
BUN: 32 mg/dL — ABNORMAL HIGH (ref 8–23)
CO2: 24 mmol/L (ref 22–32)
Calcium: 10.7 mg/dL — ABNORMAL HIGH (ref 8.9–10.3)
Chloride: 103 mmol/L (ref 98–111)
Creatinine, Ser: 1.39 mg/dL — ABNORMAL HIGH (ref 0.44–1.00)
GFR, Estimated: 40 mL/min — ABNORMAL LOW (ref >60)
Glucose, Bld: 130 mg/dL — ABNORMAL HIGH (ref 70–99)
Potassium: 4.5 mmol/L (ref 3.5–5.1)
Sodium: 136 mmol/L (ref 135–145)

## 2022-06-19 LAB — MAGNESIUM: Magnesium: 2.1 mg/dL (ref 1.7–2.4)

## 2022-06-19 NOTE — Progress Notes (Signed)
Physical Therapy Treatment Patient Details Name: Lisa Maxwell MRN: [id number] DOB: 1945-05-02 Today's Date: 06/19/2022   History of Present Illness 77 y.o. female who was admitted 06/09/22 for L foot pain and weakness with hx of recent fall. Pt found to have L1 fracture. PMH: A-fib on Eliquis, HTN, COPD, diabetes, HLD, obesity, history of pulmonary embolism on Eliquis, CAD, history of cauda equina syndrome bilateral low back pain with sciatica, CKD, chronic pain syndrome PVD.    PT Comments    Patient progressing slowly towards PT goals. Session focused on standing bouts and transfers. Pt reports mild pain in back today. Requires Mod A of 2 for standing from different surface heights and max A of 2 for SPT x2 chair to/from [facility]. Pt with flexed posture and difficulty clearing/progressing BLEs despite assist for weight shifting, manual assist and verbal cues. Fatigues quickly with minimal activity at this time. Tolerated there ex in chair. Continues to be appropriate for SNF. Recommend stedy transfer back to bed when pt ready. Tech aware. Will follow.     Recommendations for follow up therapy are one component of a multi-disciplinary discharge planning process, led by the attending physician.  Recommendations may be updated based on patient status, additional functional criteria and insurance authorization.  Follow Up Recommendations  Skilled nursing-short term rehab (<3 hours/day) Can patient physically be transported by private vehicle: No   Assistance Recommended at Discharge Frequent or constant Supervision/Assistance  Patient can return home with the following Two people to help with walking and/or transfers;Two people to help with bathing/dressing/bathroom   Equipment Recommendations  None recommended by PT    Recommendations for Other Services       Precautions / Restrictions Precautions Precautions: Fall;Back;Other (comment) Precaution Booklet Issued: No Precaution Comments: TLSO  brace - may remove to shower; R foot drop - AFO in room Restrictions Weight Bearing Restrictions: No     Mobility  Bed Mobility               General bed mobility comments: Sitting in recliner upon PT arrival.    Transfers Overall transfer level: Needs assistance Equipment used: Rolling walker (2 wheels), 2 person hand held assist Transfers: Sit to/from Stand, Bed to chair/wheelchair/BSC Sit to Stand: Mod assist, +2 physical assistance Stand pivot transfers: Max assist, +2 physical assistance         General transfer comment: MOd A of 2 to power to standing with manual cues for hand placement/technique, pt reaching for middle bar of RW to assist with standing on a few occasions. Stood from chair x1, SPT chair to/from [facility] Max A of 2 and stood from [facility] x2, flexed posture with difficulty advancing LEs despite max verbal/tactile cues to assist.    Ambulation/Gait               General Gait Details: Unable   Stairs             Wheelchair Mobility    Modified Rankin (Stroke Patients Only)       Balance Overall balance assessment: Needs assistance Sitting-balance support: Feet supported, Single extremity supported Sitting balance-Leahy Scale: Fair Sitting balance - Comments: reliant on support   Standing balance support: During functional activity Standing balance-Leahy Scale: Poor Standing balance comment: Reliant on external support                            Cognition Arousal/Alertness: Awake/alert Behavior During Therapy: WFL for tasks assessed/performed Overall  Cognitive Status: Impaired/Different from baseline Area of Impairment: Attention, Following commands, Awareness, Problem solving, Memory                   Current Attention Level: Sustained Memory: Decreased recall of precautions Following Commands: Follows one step commands consistently, Follows one step commands with increased time, Follows multi-step commands  inconsistently   Awareness: Emergent Problem Solving: Slow processing, Difficulty sequencing, Requires verbal cues, Requires tactile cues, Decreased initiation General Comments: alert with decreased complaints of pain however slow processing and needs repetition.        Exercises General Exercises - Lower Extremity Long Arc Quad: AROM, Both, 10 reps, Seated (more difficulty with LLE over RLE) Heel Raises: AROM, Both, 10 reps, Seated    General Comments General comments (skin integrity, edema, etc.): Spouse present during session.      Pertinent Vitals/Pain Pain Assessment Pain Assessment: Faces Faces Pain Scale: Hurts a little bit Pain Location: generalized, back Pain Descriptors / Indicators: Discomfort, Grimacing Pain Intervention(s): Monitored during session, Repositioned, Premedicated before session    Home Living                          Prior Function            PT Goals (current goals can now be found in the care plan section) Progress towards PT goals: Progressing toward goals (slowly)    Frequency    Min 3X/week      PT Plan Current plan remains appropriate    Co-evaluation              AM-PAC PT "6 Clicks" Mobility   Outcome Measure  Help needed turning from your back to your side while in a flat bed without using bedrails?: A Lot Help needed moving from lying on your back to sitting on the side of a flat bed without using bedrails?: A Lot Help needed moving to and from a bed to a chair (including a wheelchair)?: Total Help needed standing up from a chair using your arms (e.g., wheelchair or bedside chair)?: Total Help needed to walk in hospital room?: Total Help needed climbing 3-5 steps with a railing? : Total 6 Click Score: 8    End of Session Equipment Utilized During Treatment: Gait belt;Back brace;Other (comment) (Rt AFO) Activity Tolerance: Patient limited by fatigue Patient left: in chair;with call bell/phone within  reach;with chair alarm set;with family/visitor present Nurse Communication: Mobility status;Need for lift equipment (stedy) PT Visit Diagnosis: Unsteadiness on feet (R26.81);Other abnormalities of gait and mobility (R26.89);Muscle weakness (generalized) (M62.81);History of falling (Z91.81);Difficulty in walking, not elsewhere classified (R26.2);Pain Pain - part of body:  (back)     Time: [id number] PT Time Calculation (min) (ACUTE ONLY): 21 min  Charges:  $Therapeutic Activity: 8-22 mins                     Marisa Severin, PT, DPT Acute Rehabilitation Services Secure chat preferred Office [place] 06/19/2022, 12:50 PM

## 2022-06-19 NOTE — Progress Notes (Signed)
Occupational Therapy Treatment Patient Details Name: Lisa Maxwell MRN: [id number] DOB: June 24, 1945 Today's Date: 06/19/2022   History of present illness 77 y.o. female who was admitted 06/09/22 for L foot pain and weakness with hx of recent fall. Pt found to have L1 fracture. PMH: A-fib on Eliquis, HTN, COPD, diabetes, HLD, obesity, history of pulmonary embolism on Eliquis, CAD, history of cauda equina syndrome bilateral low back pain with sciatica, CKD, chronic pain syndrome PVD.   OT comments  Patient more alert this treatment session with decreased complaints of pain. Patient continues to require education on log rolling technique and mod assist to get to EOB. Patient demonstrated gains with sit to stands and transfer to recliner with RW and mod assist. Patient stated little to no pain seated in recliner. Patient would benefit from further OT treatment to address bathing, dressing, and toilet transfers. Acute OT to continue to follow.    Recommendations for follow up therapy are one component of a multi-disciplinary discharge planning process, led by the attending physician.  Recommendations may be updated based on patient status, additional functional criteria and insurance authorization.    Follow Up Recommendations  Skilled nursing-short term rehab (<3 hours/day) (may have [place] if patient's family can provide 24/7 assist)     Assistance Recommended at Discharge Frequent or constant Supervision/Assistance  Patient can return home with the following  Two people to help with walking and/or transfers;Two people to help with bathing/dressing/bathroom;Assist for transportation;Help with stairs or ramp for entrance;Assistance with cooking/housework   Equipment Recommendations  Other (comment) (continue to assess)    Recommendations for Other Services      Precautions / Restrictions Precautions Precautions: Fall;Back;Other (comment) Precaution Booklet Issued: No Precaution Comments: TLSO  brace - may remove to shower; R foot drop - AFO in room Restrictions Weight Bearing Restrictions: No       Mobility Bed Mobility Overal bed mobility: Needs Assistance Bed Mobility: Sidelying to Sit   Sidelying to sit: Mod assist       General bed mobility comments: instructions on log rolling technique and mod assist to raise trunk    Transfers Overall transfer level: Needs assistance Equipment used: Rolling walker (2 wheels) Transfers: Sit to/from Stand, Bed to chair/wheelchair/BSC Sit to Stand: Mod assist     Step pivot transfers: Mod assist     General transfer comment: assistance with guiding hips and managing RW     Balance Overall balance assessment: Needs assistance Sitting-balance support: Feet supported, Bilateral upper extremity supported Sitting balance-Leahy Scale: Fair Sitting balance - Comments: min guard for safety   Standing balance support: Bilateral upper extremity supported, Reliant on assistive device for balance Standing balance-Leahy Scale: Poor Standing balance comment: Reliant on RW for support                           ADL either performed or assessed with clinical judgement   ADL Overall ADL's : Needs assistance/impaired     Grooming: Wash/dry hands;Wash/dry face;Brushing hair;Sitting;Supervision/safety Grooming Details (indicate cue type and reason): in recliner         Upper Body Dressing : Maximal assistance;Sitting Upper Body Dressing Details (indicate cue type and reason): to donn brace Lower Body Dressing: Maximal assistance;Sitting/lateral leans Lower Body Dressing Details (indicate cue type and reason): to donn shoes               General ADL Comments: decreased complaints of pain and more alert allowing patient to demonstrate  gains with self care and transfers    Extremity/Trunk Assessment              Vision       Perception     Praxis      Cognition Arousal/Alertness: Awake/alert Behavior  During Therapy: WFL for tasks assessed/performed Overall Cognitive Status: Impaired/Different from baseline Area of Impairment: Attention, Following commands, Awareness, Problem solving, Memory                   Current Attention Level: Sustained Memory: Decreased recall of precautions Following Commands: Follows one step commands consistently, Follows one step commands with increased time, Follows multi-step commands inconsistently   Awareness: Emergent Problem Solving: Slow processing, Difficulty sequencing, Requires verbal cues, Requires tactile cues, Decreased initiation General Comments: alert with decreased complaints of pain        Exercises      Shoulder Instructions       General Comments      Pertinent Vitals/ Pain       Pain Assessment Pain Assessment: Faces Faces Pain Scale: Hurts a little bit Pain Location: generalized, back Pain Descriptors / Indicators: Discomfort, Grimacing Pain Intervention(s): Limited activity within patient's tolerance, Monitored during session, Repositioned  Home Living                                          Prior Functioning/Environment              Frequency  Min 2X/week        Progress Toward Goals  OT Goals(current goals can now be found in the care plan section)  Progress towards OT goals: Progressing toward goals  Acute Rehab OT Goals Patient Stated Goal: move more OT Goal Formulation: With patient Time For Goal Achievement: 06/24/22 Potential to Achieve Goals: Good ADL Goals Pt Will Perform Grooming: with set-up;with modified independence;sitting Pt Will Perform Lower Body Dressing: with caregiver independent in assisting;with set-up;sitting/lateral leans;sit to/from stand;bed level;with min assist Pt Will Transfer to Toilet: with supervision;stand pivot transfer;with transfer board;anterior/posterior transfer;bedside commode;with min guard assist Pt Will Perform Toileting - Clothing  Manipulation and hygiene: with min guard assist;with min assist;with caregiver independent in assisting;sitting/lateral leans Additional ADL Goal #1: Pt will be Mod I bed mobility, adhering to back precautions and applying TLSO in preparation for increased participation in ADL's  Plan Discharge plan remains appropriate    Co-evaluation                 AM-PAC OT "6 Clicks" Daily Activity     Outcome Measure   Help from another person eating meals?: None Help from another person taking care of personal grooming?: A Little Help from another person toileting, which includes using toliet, bedpan, or urinal?: A Lot Help from another person bathing (including washing, rinsing, drying)?: A Lot Help from another person to put on and taking off regular upper body clothing?: A Little Help from another person to put on and taking off regular lower body clothing?: Total 6 Click Score: 15    End of Session Equipment Utilized During Treatment: Rolling walker (2 wheels);Back brace  OT Visit Diagnosis: Other abnormalities of gait and mobility (R26.89);History of falling (Z91.81);Pain   Activity Tolerance Patient tolerated treatment well   Patient Left in chair;with call bell/phone within reach;with chair alarm set;with family/visitor present   Nurse Communication Mobility status  [id number] OT Time Calculation (min): 24 min  Charges: OT General Charges $OT Visit: 1 Visit OT Treatments $Self Care/Home Management : 8-22 mins $Therapeutic Activity: 8-22 mins  Lodema Hong, OTA Acute Rehabilitation Services  Office [place] 06/19/2022, 12:49 PM

## 2022-06-19 NOTE — TOC Progression Note (Signed)
Transition of Care [facility]) - Progression Note    Patient Details  Name: Lisa Maxwell MRN: [id number] Date of Birth: Mar 07, 1946  Transition of Care [facility]) CM/SW [place], [place] Phone Number: 06/19/2022, 4:16 PM  Clinical Narrative:   Patient's spouse contacted CSW to ask about [facility], since discharge has been delayed. [facility] has a bed available. CSW updated insurance authorization for [employer], and updated spouse. Spouse appreciative of update. [facility] can admit tomorrow, pending medical stability. CSW to follow.    Expected Discharge Plan: [place] Barriers to Discharge: Continued Medical Work up  Expected Discharge Plan and [place] Choice: [place] arrangements for the past 2 months: Single Family Home                                       Social Determinants of Health (SDOH) Interventions SDOH Screenings   Food Insecurity: No Food Insecurity (06/10/2022)  Housing: Low Risk  (06/10/2022)  Transportation Needs: No Transportation Needs (06/10/2022)  Utilities: Not At Risk (06/10/2022)  Tobacco Use: Medium Risk (06/18/2022)    Readmission Risk Interventions     No data to display

## 2022-06-19 NOTE — Plan of Care (Signed)

## 2022-06-19 NOTE — TOC Progression Note (Signed)
Transition of Care [facility]) - Progression Note    Patient Details  Name: Lisa Maxwell MRN: [id number] Date of Birth: 1945/09/08  Transition of Care [facility]) CM/SW [place], [place] Phone Number: 06/19/2022, 4:14 PM  Clinical Narrative:   CSW met with patient's spouse and daughter to discuss SNF options and answer questions. After discussion, family chose [place]. CSW confirmed bed availability at [facility] and initiated insurance authorization. CSW to follow.    Expected Discharge Plan: [place] Barriers to Discharge: Continued Medical Work up  Expected Discharge Plan and [place] Choice: [place] arrangements for the past 2 months: Single Family Home                                       Social Determinants of Health (SDOH) Interventions SDOH Screenings   Food Insecurity: No Food Insecurity (06/10/2022)  Housing: Low Risk  (06/10/2022)  Transportation Needs: No Transportation Needs (06/10/2022)  Utilities: Not At Risk (06/10/2022)  Tobacco Use: Medium Risk (06/18/2022)    Readmission Risk Interventions     No data to display

## 2022-06-19 NOTE — Progress Notes (Signed)
PROGRESS NOTE    [name]  [id number] DOB: [birth year] DOA: 06/09/2022 PCP: Nicoletta Dress, MD    Brief Narrative:   Lisa Maxwell is a 77 y.o. female with past medical history of atrial fibrillation on Eliquis, hypertension, COPD, diabetes mellitus type 2, hyperlipidemia, obesity, history of pulmonary embolism on Eliquis, CAD,  status post low back pain and sciatica, chronic pain syndrome on spinal stimulator and peripheral vascular disease presented to hospital with left foot pain and weakness.  Patient already has right foot weakness and is on a brace for at least a week now but now both legs were hurting.  Patient did have a fall a week back when she slumped against the wall and slid down and started having severe foot pain on the left.  She uses walker at baseline but has been wheelchair-bound at this time. In the ED, patient had mild tachycardia.  She did have mild leukocytosis at 11.7.  Chemistry showed creatinine at 1.4.  CT scan of the lumbar spine showed L1 inferior endplate fracture, CT head was nonacute.  Chest x-ray was negative.  She was given IV Dilaudid, neurosurgery was consulted and patient was admitted hospital for further evaluation and treatment.   TSLO brace has been prescribed by neurosurgery.  Patient had abnormal esophageal findings on CT scan and barium swallow was done which showed tertiary contractions with difficulty passing the barium tablet and patient was having more dysphagia, so GI was consulted.  Plan is for EGD 06/18/2022. At this time,  patient is awaiting for skilled nursing facility placement and EGD evaluation.  06/19/2022: Patient has undergone EGD (please see EGD report).  As per the GI team, anticoagulation may be started tomorrow.  Patient will also be discharged to skilled nursing facility tomorrow if patient tolerates anticoagulation.  Assessment and plan.  L1 inferior endplate fracture with back and foot pain with ambulatory dysfunction,  deconditioning, debility.   Has been more wheelchair-bound at this time.  Continue analgesics trying to avoid narcotics if possible due to episodes of confusion delirium.[name]  Has a spinal cord stimulator.[name]  Physical therapy evaluation recommend skilled nursing facility.  Neurosurgery has recommended putting the patient in TSL0 brace and pain management with outpatient neurosurgery follow-up.  Physical therapy has recommended skilled nursing facility placement on discharge.     Esophageal abnormality/dysphagia Barium swallow examination showed a narrowing of the distal esophagus with tertiary contractions.  Barium tablet was unable to pass through the lower esophageal junction.  Seen by speech therapy who recommended GI evaluation for abnormal findings.  GI has been consulted and plan for EGD with possible dilatation on 06/18/2022.  Continue pantoprazole. 06/19/2022: Patient has undergone EGD.  Please see documentation.  Confusion, agitation during the nighttime.  Off fentanyl and on decreased dose of Norco.  Trying to minimize narcotics.  On spinal cord stimulator.  Continue spinal cord stimulator.  Alert awake and Communicative but disoriented. 06/19/2022: Patient is resting quietly.  CKD (chronic kidney disease) stage 3b Continue to monitor BMP.  Latest creatinine at 1.3.  Mild hypomagnesemia.  Replenished.  Latest magnesium level of 2.3.   CAD (coronary artery disease) Continue Mevacor, aspirin, statin, beta-blocker and Plavix as outpatient.   Essential hypertension Blood pressure much better today.   Diabetes mellitus due to underlying condition with unspecified complications Continue sliding scale insulin, Accu-Cheks, diabetic diet.  Hold oral medications.  Latest POC glucose of 162.   Mixed dyslipidemia Continue Mevacor    Morbid obesity  Body mass  index is 35.2 kg/m.  Would benefit from weight loss as outpatient.   PAF (paroxysmal atrial fibrillation) (HCC) CHA2DS2-VASc score of 8.     Controlled.  Patient is not taking beta-blocker.  Eliquis on hold for EGD and esophageal dilatation.   Chronic obstructive lung disease ([place]) Appears compensated.  Continue bronchodilators.  Follow up with pulmonary as outpatient.  Not on oxygen at baseline.   History of DVT/pulmonary embolus (PE) Currently Eliquis on hold for  EGD willing to transition back to Eliquis/heparin drip after the procedure.   Foraminal stenosis of lumbar region with chronic pain syndrome. Continue analgesia.  Continue gabapentin and decrease dose of Norco.  Patient does have a spinal cord stimulator in place.  Follows up with pain management as outpatient.  OSA (obstructive sleep apnea) CPAP    DVT prophylaxis:  Eliquis on hold at this time.  Code Status:     Code Status: Full Code  Disposition:  PT, OT has recommended skilled nursing facility placement at this time.  Disposition in 1 to 2 days.  Await EGD.  Status is: Inpatient  The is inpatient because:  ambulatory dysfunction,  need for rehabilitation, plan for EGD   Family Communication:  I spoke with the patient's husband at the bedside on 3/152024.  Consultants:  Neurosurgery GI  Procedures:  TSLO brace application  Antimicrobials:  None  Anti-infectives (From admission, onward)    None      Subjective: No history from patient.  Objective: Vitals:   06/19/22 1135 06/19/22 1511 06/19/22 1511 06/19/22 2026  BP: 111/87 (!) 112/95 (!) 112/95 119/65  Pulse: (!) 105 69 69 92  Resp: 16 16 16 18   Temp:  97.6 F (36.4 C) (!) 97.5 F (36.4 C) 97.8 F (36.6 C)  TempSrc:  Oral Oral Oral  SpO2: 94% 94% 94% 98%  Weight:      Height:        Intake/Output Summary (Last 24 hours) at 06/19/2022 2318 Last data filed at 06/19/2022 1816 Gross per 24 hour  Intake 720 ml  Output 400 ml  Net 320 ml    Filed Weights   06/09/22 1041  Weight: 71.2 kg    Physical Examination: Body mass index is 35.2 kg/m.   General: Obese,  elderly female.  Not in any distress. HENT:   No scleral pallor or icterus noted. Oral mucosa is moist.  Oral cavity with retained material. Chest:  C diminished breath sounds bilaterally.  CVS: S1 &S2 heard. No murmur.  Regular rate and rhythm. Abdomen: Soft, nontender, nondistended.  Bowel sounds are heard.   Extremities: No cyanosis, clubbing with  trace lower extremity edema, moves extremities Psych: Alert awake and Communicative but impaired memory, oriented to place only, CNS: Patient is resting quietly. Skin: Warm and dry.  No rashes noted.  Data Reviewed:   CBC: Recent Labs  Lab 06/14/22 0403 06/18/22 0612 06/19/22 0359  WBC 6.6 6.5 7.9  HGB 14.0 15.8* 14.9  HCT 42.6 48.5* 47.0*  MCV 93.4 92.9 94.4  PLT 180 201 215     Basic Metabolic Panel: Recent Labs  Lab 06/13/22 0356 06/14/22 0403 06/18/22 0305 06/18/22 0612 06/19/22 0359  NA 135 137  --  137 136  K 4.4 4.3  --  4.4 4.5  CL 107 105  --  101 103  CO2 22 24  --  26 24  GLUCOSE 184* 134*  --  138* 130*  BUN 21 16  --  30* 32*  CREATININE  1.18* 1.10*  --  1.38* 1.39*  CALCIUM 9.9 10.2  --  11.2* 10.7*  MG 1.6* 2.2 2.3  --  2.1     Liver Function Tests: No results for input(s): "AST", "ALT", "ALKPHOS", "BILITOT", "PROT", "ALBUMIN" in the last 168 hours.    Radiology Studies: No results found.    LOS: 8 days    Bonnell Public, MD [facility] Available via Epic secure chat 7am-7pm After these hours, please refer to coverage provider listed on [email or web address] 06/19/2022, 11:18 PM

## 2022-06-19 NOTE — Progress Notes (Signed)
Speech Language Pathology Treatment: Dysphagia  Patient Details Name: Lisa Maxwell MRN: [id number] DOB: [birth year] Today's Date: 06/19/2022 Time: [id number] SLP Time Calculation (min) (ACUTE ONLY): 12 min  Assessment / Plan / Recommendation Clinical Impression  Pt seen for swallowing reassessment, now s/p EGD with dilation.  Findings of non-obstructing Schatzki rink at [employer] junction.  Pt may resume diet per SLP recommendations.  Today pt tolerated all consistencies trialed with no clinical s/s of aspiration.  Pt reports some improvement, but does note that it feels like there is sometimes a wad of cotton when she swallows.  Recommend alternating solids and liquids as needed to help improve symptoms.  Husband still has esophageal precautions handout.  Asked about repeat dilations.  Let husband no that some people do require repeat intervention and that if symptoms return, they may want to see OP GI.  Pt has no further ST needs.  SLP will sign off at this time.   Recommend regular texture diet with thin liquids.  Administer medications as tolerated.    HPI HPI: Lisa Maxwell is a 77 y.o. female who presented to hospital with left foot pain and weakness. Head CT and CXR 3/5 with no acute findings.  Esophagram 3/6: "1. Narrowing of the distal esophagus which does not allow passage of the 79mm barium tablet.  2. Tertiary contractions leading to moderate dysmotility."  EGD 3-14 with findings of Schatzki Ring, now s/p dilation.  Pt with past medical history of atrial fibrillation on Eliquis, hypertension, COPD, diabetes mellitus type 2, hyperlipidemia, obesity, history of pulmonary embolism on Eliquis, CAD, status post low back pain and sciatica, chronic pain syndrome on spinal stimulator and peripheral vascular disease.      SLP Plan  All goals met      Recommendations for follow up therapy are one component of a multi-disciplinary discharge planning process, led by the attending physician.   Recommendations may be updated based on patient status, additional functional criteria and insurance authorization.    Recommendations  Diet recommendations: Regular;Thin liquid Liquids provided via: Cup;Straw Medication Administration:  (No specific precautions, as tolerated) Supervision: Patient able to self feed Compensations: Slow rate;Small sips/bites Postural Changes and/or Swallow Maneuvers: Seated upright 90 degrees;Upright 30-60 min after meal                Oral Care Recommendations: Oral care BID Follow Up Recommendations: No SLP follow up Assistance recommended at discharge: None SLP Visit Diagnosis: Dysphagia, pharyngoesophageal phase (R13.14) Plan: All goals met           Celedonio Savage, MA, [place] Office: [phone] 06/19/2022, 12:13 PM

## 2022-06-20 ENCOUNTER — Encounter (HOSPITAL_COMMUNITY): Payer: Self-pay | Admitting: Gastroenterology

## 2022-06-20 DIAGNOSIS — N183 Chronic kidney disease, stage 3 unspecified: Secondary | ICD-10-CM | POA: Diagnosis not present

## 2022-06-20 DIAGNOSIS — M48062 Spinal stenosis, lumbar region with neurogenic claudication: Secondary | ICD-10-CM | POA: Diagnosis not present

## 2022-06-20 DIAGNOSIS — Z7401 Bed confinement status: Secondary | ICD-10-CM | POA: Diagnosis not present

## 2022-06-20 DIAGNOSIS — R2681 Unsteadiness on feet: Secondary | ICD-10-CM | POA: Diagnosis not present

## 2022-06-20 DIAGNOSIS — R52 Pain, unspecified: Secondary | ICD-10-CM | POA: Diagnosis not present

## 2022-06-20 DIAGNOSIS — I4891 Unspecified atrial fibrillation: Secondary | ICD-10-CM | POA: Diagnosis not present

## 2022-06-20 DIAGNOSIS — R262 Difficulty in walking, not elsewhere classified: Secondary | ICD-10-CM | POA: Diagnosis not present

## 2022-06-20 DIAGNOSIS — N179 Acute kidney failure, unspecified: Secondary | ICD-10-CM | POA: Diagnosis not present

## 2022-06-20 DIAGNOSIS — M5430 Sciatica, unspecified side: Secondary | ICD-10-CM | POA: Diagnosis not present

## 2022-06-20 DIAGNOSIS — S32010G Wedge compression fracture of first lumbar vertebra, subsequent encounter for fracture with delayed healing: Secondary | ICD-10-CM | POA: Diagnosis not present

## 2022-06-20 DIAGNOSIS — E119 Type 2 diabetes mellitus without complications: Secondary | ICD-10-CM | POA: Diagnosis not present

## 2022-06-20 DIAGNOSIS — S32010D Wedge compression fracture of first lumbar vertebra, subsequent encounter for fracture with routine healing: Secondary | ICD-10-CM | POA: Diagnosis not present

## 2022-06-20 DIAGNOSIS — Z5181 Encounter for therapeutic drug level monitoring: Secondary | ICD-10-CM | POA: Diagnosis not present

## 2022-06-20 DIAGNOSIS — R1314 Dysphagia, pharyngoesophageal phase: Secondary | ICD-10-CM | POA: Diagnosis not present

## 2022-06-20 DIAGNOSIS — N189 Chronic kidney disease, unspecified: Secondary | ICD-10-CM | POA: Diagnosis not present

## 2022-06-20 DIAGNOSIS — M549 Dorsalgia, unspecified: Secondary | ICD-10-CM | POA: Diagnosis not present

## 2022-06-20 DIAGNOSIS — F05 Delirium due to known physiological condition: Secondary | ICD-10-CM | POA: Diagnosis not present

## 2022-06-20 DIAGNOSIS — R531 Weakness: Secondary | ICD-10-CM | POA: Diagnosis not present

## 2022-06-20 DIAGNOSIS — E785 Hyperlipidemia, unspecified: Secondary | ICD-10-CM | POA: Diagnosis not present

## 2022-06-20 DIAGNOSIS — J449 Chronic obstructive pulmonary disease, unspecified: Secondary | ICD-10-CM | POA: Diagnosis not present

## 2022-06-20 DIAGNOSIS — G8929 Other chronic pain: Secondary | ICD-10-CM | POA: Diagnosis not present

## 2022-06-20 DIAGNOSIS — I10 Essential (primary) hypertension: Secondary | ICD-10-CM | POA: Diagnosis not present

## 2022-06-20 DIAGNOSIS — E669 Obesity, unspecified: Secondary | ICD-10-CM | POA: Diagnosis not present

## 2022-06-20 DIAGNOSIS — E782 Mixed hyperlipidemia: Secondary | ICD-10-CM | POA: Diagnosis not present

## 2022-06-20 DIAGNOSIS — E118 Type 2 diabetes mellitus with unspecified complications: Secondary | ICD-10-CM | POA: Diagnosis not present

## 2022-06-20 DIAGNOSIS — Z7409 Other reduced mobility: Secondary | ICD-10-CM | POA: Diagnosis not present

## 2022-06-20 DIAGNOSIS — S32019A Unspecified fracture of first lumbar vertebra, initial encounter for closed fracture: Secondary | ICD-10-CM | POA: Diagnosis not present

## 2022-06-20 LAB — GLUCOSE, CAPILLARY
Glucose-Capillary: 153 mg/dL — ABNORMAL HIGH (ref 70–99)
Glucose-Capillary: 196 mg/dL — ABNORMAL HIGH (ref 70–99)
Glucose-Capillary: 192 mg/dL — ABNORMAL HIGH (ref 70–99)

## 2022-06-20 MED ORDER — PANTOPRAZOLE SODIUM 40 MG PO TBEC: 40.0000 mg | DELAYED_RELEASE_TABLET | Freq: Two times a day (BID) | ORAL | 0 refills | Status: DC

## 2022-06-20 MED ORDER — HYDROCODONE-ACETAMINOPHEN 5-325 MG PO TABS: 1.0000 | ORAL_TABLET | Freq: Four times a day (QID) | ORAL | 0 refills | Status: AC | PRN

## 2022-06-20 MED ORDER — HYDROCODONE-ACETAMINOPHEN 5-325 MG PO TABS: 1.0000 | ORAL_TABLET | Freq: Four times a day (QID) | ORAL | 0 refills | Status: DC | PRN

## 2022-06-20 MED ORDER — APIXABAN 5 MG PO TABS
5.0000 mg | ORAL_TABLET | Freq: Two times a day (BID) | ORAL | Status: DC
  Administered 2022-06-20: 5 mg via ORAL
  Filled 2022-06-20: qty 1

## 2022-06-20 NOTE — Plan of Care (Signed)
Patient is discharged to [facility] rehab. PIV removed. All personal belongings were returned. Left the floor at 1644 via EMS.   Problem: Education: Goal: Ability to describe self-care measures that may prevent or decrease complications (Diabetes Survival Skills Education) will improve Outcome: Completed/Met Goal: Individualized Educational Video(s) Outcome: Completed/Met   Problem: Coping: Goal: Ability to adjust to condition or change in health will improve Outcome: Completed/Met   Problem: Fluid Volume: Goal: Ability to maintain a balanced intake and output will improve Outcome: Completed/Met   Problem: Health Behavior/Discharge Planning: Goal: Ability to identify and utilize available resources and services will improve Outcome: Completed/Met Goal: Ability to manage health-related needs will improve Outcome: Completed/Met   Problem: Metabolic: Goal: Ability to maintain appropriate glucose levels will improve Outcome: Completed/Met   Problem: Nutritional: Goal: Maintenance of adequate nutrition will improve Outcome: Completed/Met Goal: Progress toward achieving an optimal weight will improve Outcome: Completed/Met   Problem: Skin Integrity: Goal: Risk for impaired skin integrity will decrease Outcome: Completed/Met   Problem: Tissue Perfusion: Goal: Adequacy of tissue perfusion will improve Outcome: Completed/Met   Problem: Education: Goal: Knowledge of General Education information will improve Description: Including pain rating scale, medication(s)/side effects and non-pharmacologic comfort measures Outcome: Completed/Met   Problem: Health Behavior/Discharge Planning: Goal: Ability to manage health-related needs will improve Outcome: Completed/Met   Problem: Clinical Measurements: Goal: Ability to maintain clinical measurements within normal limits will improve Outcome: Completed/Met Goal: Will remain free from infection Outcome: Completed/Met Goal: Diagnostic  test results will improve Outcome: Completed/Met Goal: Respiratory complications will improve Outcome: Completed/Met Goal: Cardiovascular complication will be avoided Outcome: Completed/Met   Problem: Activity: Goal: Risk for activity intolerance will decrease Outcome: Completed/Met   Problem: Nutrition: Goal: Adequate nutrition will be maintained Outcome: Completed/Met   Problem: Coping: Goal: Level of anxiety will decrease Outcome: Completed/Met   Problem: Elimination: Goal: Will not experience complications related to bowel motility Outcome: Completed/Met Goal: Will not experience complications related to urinary retention Outcome: Completed/Met   Problem: Pain Managment: Goal: General experience of comfort will improve Outcome: Completed/Met   Problem: Safety: Goal: Ability to remain free from injury will improve Outcome: Completed/Met   Problem: Skin Integrity: Goal: Risk for impaired skin integrity will decrease Outcome: Completed/Met

## 2022-06-20 NOTE — Discharge Summary (Signed)
Physician Discharge Summary  Lisa Maxwell [id number] DOB: 10-21-1945 DOA: 06/09/2022  PCP: Nicoletta Dress, MD  Admit date: 06/09/2022 Discharge date: 06/20/2022  Admitted From: Home Disposition:  SNF  Recommendations for Outpatient Follow-up:  Follow-up with neurosurgery outpatient for L1 fracture Continue TSLO brace and as needed pain medication and physical therapy at nursing home  Home Health: None Equipment/Devices: None Discharge Condition: Stable CODE STATUS: Full code Diet recommendation: Regular, thin liquid  Brief/Interim Summary:  Lisa Maxwell is a 77 y.o. female with past medical history of atrial fibrillation on Eliquis, hypertension, COPD, diabetes mellitus type 2, hyperlipidemia, obesity, history of pulmonary embolism on Eliquis, CAD,  status post low back pain and sciatica, chronic pain syndrome on spinal stimulator and peripheral vascular disease presented to hospital with left foot pain and weakness.   In the ED, patient had mild tachycardia.  She did have mild leukocytosis at 11.7.  Chemistry showed creatinine at 1.4.  CT scan of the lumbar spine showed L1 inferior endplate fracture, CT head was nonacute.  Chest x-ray was negative.  She was given IV Dilaudid, neurosurgery was consulted and patient was admitted hospital for further evaluation and treatment.   TSLO brace has been prescribed by neurosurgery.    Patient had abnormal esophageal findings on CT scan and barium swallow was done which showed tertiary contractions with difficulty passing the barium tablet and patient was having more dysphagia, so GI was consulted.  Underwent EGD 06/18/2022.  PT/OT recommended SNF   L1 fracture: -Neurosurgery has recommended putting the patient in TSL0 brace and pain management with outpatient neurosurgery follow-up.  Physical therapy has recommended skilled nursing facility placement.     Esophageal dysphagia -Barium swallow examination showed a narrowing of the distal  esophagus with tertiary contractions.  Barium tablet was unable to pass through the lower esophageal junction.   -Seen by speech therapy who recommended GI evaluation for abnormal findings.   -GI has been consulted, underwent EGD with possible dilatation on 06/18/2022.  -GI recommended continue PPI twice a day for 2 weeks  then decrease back to once daily -Eliquis resumed after 48 hours of EGD as per recommendations.  Delirium:  -Resolved.  Back to baseline.  CKD (chronic kidney disease) stage 3b -Remained at baseline   Mild hypomagnesemia.  Replenished.  Latest magnesium level of 2.3.   CAD (coronary artery disease) -Continued aspirin, statin   Essential hypertension -Remained stable.   Diabetes mellitus due to underlying condition with unspecified complications -Resumed home medication at the time of discharge-Rybelsus.  Started on sliding scale insulin during hospitalization.  Mixed dyslipidemia -Continued lovastatin   Morbid obesity  -Body mass index is 35.2 kg/m associated with hypertension and type 2 diabetes.  Would benefit from weight loss as outpatient.   PAF (paroxysmal atrial fibrillation) (HCC) CHA2DS2-VASc score of 8.    Controlled.  Patient is not taking beta-blocker.  Eliquis on hold for EGD and esophageal dilatation.   Chronic obstructive lung disease ([place]) -Appears compensated.  Continued bronchodilators.  Follow up with pulmonary as outpatient.  -Not on oxygen at baseline.   History of DVT/pulmonary embolus (PE) -Resume Eliquis after 48 hours of EGD as per GI recommendation   Foraminal stenosis of lumbar region with chronic pain syndrome. -Continued gabapentin and decrease dose of Norco.  Patient does have a spinal cord stimulator in place.  Follows up with pain management as outpatient.   OSA (obstructive sleep apnea) CPAP   discharge Diagnoses:  L1 fracture Esophageal dysphagia Delirium  CKD stage IIIb Hypomagnesemia Coronary artery  disease Essential hypertension Type 2 diabetes melitis Mixed hyperlipidemia Morbid obesity PAF COPD  History of DVT/PE Foraminal stenosis of lumbar region with chronic pain syndrome OSA   Discharge Instructions  Discharge Instructions     Increase activity slowly   Complete by: As directed       Allergies as of 06/20/2022   No Known Allergies      Medication List     STOP taking these medications    NONFORMULARY OR COMPOUNDED ITEM       TAKE these medications    apixaban 5 MG Tabs tablet Commonly known as: ELIQUIS Take 1 tablet (5 mg total) by mouth 2 (two) times daily.   Centrum Silver 50+Women Tabs Take 1 tablet by mouth daily with breakfast.   Co Q-10 200 MG Caps Take 200 mg by mouth in the morning.   diphenhydrAMINE 25 MG tablet Commonly known as: BENADRYL Take 25 mg by mouth in the morning.   docusate sodium 100 MG capsule Commonly known as: COLACE Take 100 mg by mouth daily.   fluticasone furoate-vilanterol 100-25 MCG/ACT Aepb Commonly known as: BREO ELLIPTA Inhale 1-2 puffs into the lungs daily.   gabapentin 600 MG tablet Commonly known as: NEURONTIN Take 600 mg by mouth at bedtime.   HYDROcodone-acetaminophen 10-325 MG tablet Commonly known as: NORCO Take 0.5 tablets by mouth 4 (four) times daily.   Iron (Ferrous Sulfate) 325 (65 Fe) MG Tabs Take 65 mg of iron by mouth daily with breakfast.   latanoprost 0.005 % ophthalmic solution Commonly known as: XALATAN Place 1 drop into both eyes at bedtime.   lovastatin 40 MG tablet Commonly known as: MEVACOR Take 40 mg by mouth at bedtime.   methocarbamol 500 MG tablet Commonly known as: ROBAXIN Take 1 tablet (500 mg total) by mouth every 6 (six) hours as needed for muscle spasms. What changed:  how much to take when to take this   naloxone 4 MG/0.1ML Liqd nasal spray kit Commonly known as: NARCAN Place 1 spray into the nose daily as needed (accidental overdose).   nitroGLYCERIN  0.4 MG SL tablet Commonly known as: NITROSTAT Place 0.4 mg under the tongue every 5 (five) minutes as needed for chest pain.   pantoprazole 40 MG tablet Commonly known as: PROTONIX Take 1 tablet (40 mg total) by mouth daily. What changed:  when to take this reasons to take this   Rybelsus 3 MG Tabs Generic drug: Semaglutide Take 3 mg by mouth daily before breakfast.   triamcinolone ointment 0.5 % Commonly known as: KENALOG Apply 1 Application topically See admin instructions. Apply to affected areas of both feet two to three times a week   trimethoprim 100 MG tablet Commonly known as: TRIMPEX Take 100 mg by mouth in the morning.   Vitamin B-12 500 MCG Subl Place 500 mcg under the tongue in the morning.        Contact information for after-discharge care     [facility] .   Service: Skilled Nursing Contact information: [address] [place] [address] [phone]                    No Known Allergies  Consultations: GI Neurosurgery   Procedures/Studies: DG ESOPHAGUS W SINGLE CM (SOL OR THIN BA)  Result Date: 06/10/2022 CLINICAL DATA:  77 year old female admitted with weakness. Possible esophageal abnormality noted on imaging.  Barium swallow evaluation requested. EXAM: ESOPHAGUS/BARIUM SWALLOW/TABLET STUDY TECHNIQUE: Single contrast examination was performed using thin liquid barium. This exam was performed by Brynda Greathouse PA-C, and was supervised and interpreted by Claudie Revering, MD. FLUOROSCOPY: Radiation Exposure Index (as provided by the fluoroscopic device): 23.2 mGy Kerma COMPARISON:  CT Thoracic Spine 06/09/22 FINDINGS: Swallowing: Appears normal. No vestibular penetration or aspiration seen. Pharynx: Unremarkable. Esophagus: Smooth narrowing of the distal esophagus at the gastroesophageal junction without stricture. Esophageal motility: Tertiary contractions most pronounced in the  mid esophagus leading to moderate dysmotility and delayed passage of barium bolus. Hiatal Hernia: None. Gastroesophageal reflux: None visualized. Ingested 22mm barium tablet: Became stuck in the distal esophagus Other: Limited exam due to patient immobility and acute illness. IMPRESSION: 1. Narrowing of the distal esophagus which does not allow passage of the 47mm barium tablet. 2.  Tertiary contractions leading to moderate dysmotility. Electronically Signed   By: Claudie Revering M.D.   On: 06/10/2022 13:53   MR LUMBAR SPINE WO CONTRAST  Result Date: 06/09/2022 CLINICAL DATA:  Right foot drop for a week and a half. EXAM: MRI LUMBAR SPINE WITHOUT CONTRAST TECHNIQUE: Multiplanar, multisequence MR imaging of the lumbar spine was performed. No intravenous contrast was administered. COMPARISON:  Lumbar CT from earlier the same day FINDINGS: Segmentation:  Standard. Alignment:  Dextroscoliosis. Vertebrae: L1 inferior endplate fracture with [id number] height loss measured by CT. Fracture is potentially subacute by CT. Mild concavity of the L2 superior endplate as well. Discogenic endplate edema at the L1 and L2 bodies. No aggressive bone lesion. Conus medullaris and cauda equina: Conus extends to the T12-L1 level. The conus appears T2 hyperintense at the level of the gray matter although only seen at levels where there is artifact from spinal cord stimulator. Paraspinal and other soft tissues: Postoperative scarring and atrophy of intrinsic back muscles. Sigmoid diverticulosis. Disc levels: T10-11: Disc collapse with endplate and facet spurring causing biforaminal impingement and at least moderate spinal stenosis. T11-12: Disc collapse and endplate degeneration with circumferential bulging and facet spurring. Biforaminal impingement. Signs of high-grade spinal stenosis although likely exacerbated by artifact from the hardware. T12- L1: Degenerative facet spurring asymmetric to the right. The canal and foramina are patent L1-L2:  Disc narrowing and bulging with endplate spurring. Moderate spinal stenosis. Left more than right foraminal impingement. L2-L3: Fusion with no visible impingement L3-L4: Degenerative facet spurring which is bulky. Circumferential disc bulging with left foraminal annular fissure. Moderate spinal stenosis. Moderate right foraminal narrowing L4-L5: Degenerative facet spurring with bulky hypertrophy asymmetric to the right. Mild anterolisthesis and circumferential disc bulging. Moderate spinal stenosis. Right foraminal impingement. L5-S1:Bulky degenerative facet spurring. IMPRESSION: 1. Advanced and generalized lumbar spine degeneration with scoliosis and L4-5 anterolisthesis. 2. L1 inferior endplate fracture which is subacute to chronic. L1 and L2 vertebral body edema which could be degenerative or traumatic. 3. T11-12 severe degeneration with spinal stenosis and apparent cord impingement that is likely accentuated by susceptibility artifact from spinal cord stimulator leads. On axial T2 weighted imaging there is question of lower cord edema, correlate for myelopathy. Severe biforaminal impingement at this level. 4. Foraminal stenosis on the right at L1-2, L3-4, and L4-5. 5. Left foraminal stenosis primarily at L1-2. 6. Moderate spinal stenosis at L3-4 and L4-5. Electronically Signed   By: Jorje Guild M.D.   On: 06/09/2022 19:58   CT Thoracic Spine Wo Contrast  Result Date: 06/09/2022 CLINICAL DATA:  Back trauma EXAM: CT THORACIC SPINE WITHOUT CONTRAST TECHNIQUE: Multidetector CT images of the thoracic  were obtained using the standard protocol without intravenous contrast. RADIATION DOSE REDUCTION: This exam was performed according to the departmental dose-optimization program which includes automated exposure control, adjustment of the mA and/or kV according to patient size and/or use of iterative reconstruction technique. COMPARISON:  None Available. FINDINGS: Alignment: Normal. Vertebrae: No acute fracture or  focal pathologic process. The see separately dictated lumbar spine CT for description of osseous changes of the L1 vertebral body level. Paraspinal and other soft tissues: Small amount of secretions in the esophagus. Moderate centrilobular emphysema. Ground-glass opacity in the right lobe, nonspecific. Gallbladder is partially imaged and appears distended. Right basilar atelectasis. Is mild nonspecific soft tissue stranding of the GE junction (series 19, image 105). Large left-sided thyroid nodule measuring up to 1.8 cm. Disc levels: Spinal nerve stimulator leads terminate at the T6-T9 levels. IMPRESSION: 1. No acute fracture or traumatic malalignment of the thoracic spine. 2. Small amount of secretions/debris in the esophagus with mild nonspecific soft tissue stranding at the GE junction. Correlate for symptoms of dysphagia. Ground-glass opacity in the right upper lobe, nonspecific. 3. Spinal nerve stimulator leads terminate at the T6-T9 levels. 4. 1.8 cm left thyroid nodule. Recommend further evaluation with a dedicated thyroid ultrasound. 5. Please refer to the separately dictated lumbar spine CT for description of osseous changes of the L1 vertebral body level. Emphysema (ICD10-J43.9). Electronically Signed   By: Marin Roberts M.D.   On: 06/09/2022 13:01   CT Lumbar Spine Wo Contrast  Result Date: 06/09/2022 CLINICAL DATA:  Right foot drop. Worsening leg weakness over the last 2 weeks. Back trauma. EXAM: CT LUMBAR SPINE WITHOUT CONTRAST TECHNIQUE: Multidetector CT imaging of the lumbar spine was performed without intravenous contrast administration. Multiplanar CT image reconstructions were also generated. RADIATION DOSE REDUCTION: This exam was performed according to the departmental dose-optimization program which includes automated exposure control, adjustment of the mA and/or kV according to patient size and/or use of iterative reconstruction technique. COMPARISON:  Radiography 09/22/2021.  MRI 08/15/2021  FINDINGS: Segmentation: 5 lumbar type vertebral bodies as numbered previously. Alignment: Lower thoracic and upper lumbar curvature convex to the right. Vertebrae: Chronic degenerative endplate changes at [id number]. Compression fracture/insufficiency fracture of the inferior endplate of L1. Probable solid union at the L2-3 fusion level. Paraspinal and other soft tissues: Negative. Neurostimulator in place entering the T12-L1 interlaminar space. Disc levels: T11-12: Chronic disc degeneration with loss of disc height. Chronic endplate sclerosis and cystic change. Circumferential protrusion of the disc. Potential for significant stenosis at this level. T12-L1: Disc bulge. No compressive stenosis. Neurostimulator enters posteriorly at this level. L1-2: Progressive disc degeneration with vacuum phenomenon. Inferior endplate compression fracture or insufficiency fracture at L1 with loss of height anteriorly of 30%. Circumferential bulging of the disc. Multifactorial stenosis at this level that could be significant. Pedicle screw on the right comes very close to or breaches the superior endplate. L2-3: Previous discectomy and fusion procedure. Probable solid union. Sufficient decompression of the canal. Some potential for ongoing foraminal stenosis on the right. L3-4: Bulging of the disc. Facet and ligamentous hypertrophy. Moderate multifactorial stenosis. Bilateral foraminal stenosis. L4-5: Chronic facet arthropathy with anterolisthesis of 3 mm. Bulging of the disc. Moderate multifactorial stenosis. Bilateral foraminal stenosis. L5-S1: Bulging of the disc. Bilateral facet degeneration and hypertrophy. Stenosis of the subarticular lateral recesses. Sufficient patency of the central canal and neural foramina. Bilateral sacroiliac osteoarthritis is present IMPRESSION: 1. T11-12: Chronic disc degeneration with loss of disc height. Chronic endplate sclerosis and cystic change. Circumferential protrusion  of the disc. Potential for  significant stenosis at this level. 2. L1-2: Progressive disc degeneration with vacuum phenomenon. Inferior endplate compression fracture or insufficiency fracture at L1 with loss of height anteriorly of 30%. Circumferential bulging of the disc. Multifactorial stenosis at this level that could be significant. Pedicle screw on the right comes very close to or breaches the superior endplate. 3. L2-3: Previous discectomy and fusion procedure. Probable solid union. Sufficient decompression of the canal. Some potential for ongoing foraminal stenosis on the right. 4. L3-4: Moderate multifactorial stenosis. Bilateral foraminal stenosis. 5. L4-5: Chronic facet arthropathy with anterolisthesis of 3 mm. Bulging of the disc. Moderate multifactorial stenosis. Bilateral foraminal stenosis. 6. L5-S1: Bilateral facet degeneration and hypertrophy. Stenosis of the subarticular lateral recesses. Sufficient patency of the central canal and neural foramina. Electronically Signed   By: Nelson Chimes M.D.   On: 06/09/2022 12:58   CT Head Wo Contrast  Result Date: 06/09/2022 CLINICAL DATA:  Head trauma, minor (Age >= 65y); Neck trauma (Age >= 65y) EXAM: CT HEAD WITHOUT CONTRAST CT CERVICAL SPINE WITHOUT CONTRAST TECHNIQUE: Multidetector CT imaging of the head and cervical spine was performed following the standard protocol without intravenous contrast. Multiplanar CT image reconstructions of the cervical spine were also generated. RADIATION DOSE REDUCTION: This exam was performed according to the departmental dose-optimization program which includes automated exposure control, adjustment of the mA and/or kV according to patient size and/or use of iterative reconstruction technique. COMPARISON:  11/21/2021 FINDINGS: CT HEAD FINDINGS Brain: No evidence of acute infarction, hemorrhage, hydrocephalus, extra-axial collection or mass lesion/mass effect. Chronic small left occipital lobe infarct. Patchy low-density changes within the  periventricular and subcortical white matter most compatible with chronic microvascular ischemic change. Mild diffuse cerebral volume loss. Vascular: Atherosclerotic calcifications involving the large vessels of the skull base. No unexpected hyperdense vessel. Skull: Normal. Negative for fracture or focal lesion. Sinuses/Orbits: No acute finding. 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 Other: Negative for scalp hematoma. CT CERVICAL SPINE FINDINGS Alignment: Facet joints are aligned without dislocation or traumatic listhesis. Dens and lateral masses are aligned. Grade 1 anterolisthesis of C3 on C4, C4 on C5, and C5 on C6 mediated by degenerative facet arthropathy. Skull base and vertebrae: No acute fracture. No suspicious lytic or sclerotic bone lesion. There is a 1.5 cm well-circumscribed bony protuberance arising posteriorly from the left C3 lamina, likely reflecting an osteochondroma. Soft tissues and spinal canal: No prevertebral fluid or swelling. No visible canal hematoma. Disc levels: Moderate-advanced multilevel cervical spondylosis with both degenerative disc disease and facet arthropathy. Upper chest: Mild emphysematous changes at the lung apices. Other: Bilateral carotid atherosclerosis. IMPRESSION: 1. No acute intracranial abnormality. 2. No acute fracture or traumatic listhesis of the cervical spine. Electronically Signed   By: Davina Poke D.O.   On: 06/09/2022 12:56   CT Cervical Spine Wo Contrast  Result Date: 06/09/2022 CLINICAL DATA:  Head trauma, minor (Age >= 65y); Neck trauma (Age >= 65y) EXAM: CT HEAD WITHOUT CONTRAST CT CERVICAL SPINE WITHOUT CONTRAST TECHNIQUE: Multidetector CT imaging of the head and cervical spine was performed following the standard protocol without intravenous contrast. Multiplanar CT image reconstructions of the cervical spine were also generated. RADIATION DOSE REDUCTION: This exam was performed according to  the departmental dose-optimization program which includes automated exposure control, adjustment of the mA and/or kV according to patient size and/or use of iterative reconstruction technique. COMPARISON:  11/21/2021 FINDINGS: CT HEAD FINDINGS Brain: No evidence  of acute infarction, hemorrhage, hydrocephalus, extra-axial collection or mass lesion/mass effect. Chronic small left occipital lobe infarct. Patchy low-density changes within the periventricular and subcortical white matter most compatible with chronic microvascular ischemic change. Mild diffuse cerebral volume loss. Vascular: Atherosclerotic calcifications involving the large vessels of the skull base. No unexpected hyperdense vessel. Skull: Normal. Negative for fracture or focal lesion. Sinuses/Orbits: No acute finding. 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 Other: Negative for scalp hematoma. CT CERVICAL SPINE FINDINGS Alignment: Facet joints are aligned without dislocation or traumatic listhesis. Dens and lateral masses are aligned. Grade 1 anterolisthesis of C3 on C4, C4 on C5, and C5 on C6 mediated by degenerative facet arthropathy. Skull base and vertebrae: No acute fracture. No suspicious lytic or sclerotic bone lesion. There is a 1.5 cm well-circumscribed bony protuberance arising posteriorly from the left C3 lamina, likely reflecting an osteochondroma. Soft tissues and spinal canal: No prevertebral fluid or swelling. No visible canal hematoma. Disc levels: Moderate-advanced multilevel cervical spondylosis with both degenerative disc disease and facet arthropathy. Upper chest: Mild emphysematous changes at the lung apices. Other: Bilateral carotid atherosclerosis. IMPRESSION: 1. No acute intracranial abnormality. 2. No acute fracture or traumatic listhesis of the cervical spine. Electronically Signed   By: Davina Poke D.O.   On: 06/09/2022 12:56   DG Foot Complete Left  Result Date:  06/09/2022 CLINICAL DATA:  Trauma, fall, pain EXAM: LEFT FOOT - COMPLETE 3+ VIEW COMPARISON:  None Available. FINDINGS: No displaced fracture or dislocation is seen. Osteopenia is seen in bony structures. Small plantar spur is seen in calcaneus. Small bony spurs are noted in the dorsal aspect of talonavicular joint. IMPRESSION: No recent fracture or dislocation is seen in left foot. Electronically Signed   By: Elmer Picker M.D.   On: 06/09/2022 12:15   DG Ankle 2 Views Left  Result Date: 06/09/2022 CLINICAL DATA:  Trauma, fall EXAM: LEFT ANKLE - 2 VIEW COMPARISON:  None Available. FINDINGS: No fracture or dislocation is seen. Osteopenia is seen in bony structures. Small plantar spur is seen in calcaneus. Small bony spurs are noted in the dorsal aspect of talonavicular joint. There is soft tissue swelling around the ankle. IMPRESSION: No fracture or dislocation is seen in left ankle. Electronically Signed   By: Elmer Picker M.D.   On: 06/09/2022 12:12   DG Chest 1 View  Result Date: 06/09/2022 CLINICAL DATA:  Fall EXAM: CHEST  1 VIEW COMPARISON:  11/21/21 CXR FINDINGS: Spinal nerve stimulator leads in place. Partially visualized lumbar spinal fusion hardware in place. No pleural effusion. No pneumothorax. Unchanged cardiac and mediastinal contours when accounting differences in positioning. No focal airspace opacity. No radiographically apparent displaced rib fractures. IMPRESSION: No radiographic evidence of thoracic injury. Electronically Signed   By: Marin Roberts M.D.   On: 06/09/2022 12:11      Subjective: Patient seen and examined.  Husband at the bedside.  Patient sitting comfortably and eating breakfast.  Overall pain has improved.  Comfortable going to nursing home today.  Discharge Exam: Vitals:   06/20/22 0759 06/20/22 1138  BP: (!) 145/105 (!) 83/61  Pulse: 72 (!) 129  Resp: 18 18  Temp: (!) 97.5 F (36.4 C) (!) 97.5 F (36.4 C)  SpO2: 96% 95%   Vitals:   06/19/22 2355  06/20/22 0345 06/20/22 0759 06/20/22 1138  BP: 94/74 (!) 160/97 (!) 145/105 (!) 83/61  Pulse: 76 68 72 (!) 129  Resp: 18 18 18  18  Temp: 97.7 F (36.5 C) (!) 97.5 F (36.4 C) (!) 97.5 F (36.4 C) (!) 97.5 F (36.4 C)  TempSrc: Oral Axillary Oral Oral  SpO2: 98% 97% 96% 95%  Weight:      Height:        General: Pt is alert, awake, not in acute distress, on room air, communicating well, husband at the bedside, eating breakfast Cardiovascular: RRR, S1/S2 +, no rubs, no gallops Respiratory: CTA bilaterally, no wheezing, no rhonchi Abdominal: Soft, NT, ND, bowel sounds + Extremities: no edema, no cyanosis    The results of significant diagnostics from this hospitalization (including imaging, microbiology, ancillary and laboratory) are listed below for reference.     Microbiology: No results found for this or any previous visit (from the past 240 hour(s)).   Labs: BNP (last 3 results) Recent Labs    11/21/21 0955 06/09/22 1108  BNP 101.1* [id number]   Basic Metabolic Panel: Recent Labs  Lab 06/14/22 0403 06/18/22 0305 06/18/22 0612 06/19/22 0359  NA 137  --  137 136  K 4.3  --  4.4 4.5  CL 105  --  101 103  CO2 24  --  26 24  GLUCOSE 134*  --  138* 130*  BUN 16  --  30* 32*  CREATININE 1.10*  --  1.38* 1.39*  CALCIUM 10.2  --  11.2* 10.7*  MG 2.2 2.3  --  2.1   Liver Function Tests: No results for input(s): "AST", "ALT", "ALKPHOS", "BILITOT", "PROT", "ALBUMIN" in the last 168 hours. No results for input(s): "LIPASE", "AMYLASE" in the last 168 hours. No results for input(s): "AMMONIA" in the last 168 hours. CBC: Recent Labs  Lab 06/14/22 0403 06/18/22 0612 06/19/22 0359  WBC 6.6 6.5 7.9  HGB 14.0 15.8* 14.9  HCT 42.6 48.5* 47.0*  MCV 93.4 92.9 94.4  PLT 180 201 215   Cardiac Enzymes: No results for input(s): "CKTOTAL", "CKMB", "CKMBINDEX", "TROPONINI" in the last 168 hours. BNP: Invalid input(s): "POCBNP" CBG: Recent Labs  Lab 06/19/22 1136  06/19/22 1604 06/19/22 2134 06/20/22 0609 06/20/22 1138  GLUCAP 238* 107* 156* 153* 196*   D-Dimer No results for input(s): "DDIMER" in the last 72 hours. Hgb A1c No results for input(s): "HGBA1C" in the last 72 hours. Lipid Profile No results for input(s): "CHOL", "HDL", "LDLCALC", "TRIG", "CHOLHDL", "LDLDIRECT" in the last 72 hours. Thyroid function studies No results for input(s): "TSH", "T4TOTAL", "T3FREE", "THYROIDAB" in the last 72 hours.  Invalid input(s): "FREET3" Anemia work up No results for input(s): "VITAMINB12", "FOLATE", "FERRITIN", "TIBC", "IRON", "RETICCTPCT" in the last 72 hours. Urinalysis    Component Value Date/Time   COLORURINE YELLOW 12/13/2008 0618   APPEARANCEUR CLOUDY (A) 12/13/2008 0618   LABSPEC 1.014 12/13/2008 0618   PHURINE 6.0 12/13/2008 0618   GLUCOSEU NEGATIVE 12/13/2008 0618   HGBUR NEGATIVE 12/13/2008 0618   BILIRUBINUR NEGATIVE 12/13/2008 0618   KETONESUR NEGATIVE 12/13/2008 0618   PROTEINUR NEGATIVE 12/13/2008 0618   UROBILINOGEN 0.2 12/13/2008 0618   NITRITE POSITIVE (A) 12/13/2008 0618   LEUKOCYTESUR SMALL (A) 12/13/2008 0618   Sepsis Labs Recent Labs  Lab 06/14/22 0403 06/18/22 0612 06/19/22 0359  WBC 6.6 6.5 7.9   Microbiology No results found for this or any previous visit (from the past 240 hour(s)).   Time coordinating discharge: Over 30 minutes  SIGNED:   Mckinley Jewel, MD  [facility] 06/20/2022, 12:11 PM Pager   If 7PM-7AM, please contact night-coverage [email or web address]

## 2022-06-20 NOTE — TOC Transition Note (Signed)
Transition of Care [facility]) - CM/SW Discharge Note   Patient Details  Name: Lisa Maxwell MRN: [id number] Date of Birth: 09-14-1945  [facility]) CM/SW Contact:  Amador Cunas, [place] Phone Number: 06/20/2022, 1:48 PM   Clinical Narrative:   Pt for dc to [facility] today. Spoke to Lauren in admissions who confirmed they are prepared to admit pt to room 209. Pt's husband at bedside and agreeable to dc plan. RN provided with number for report and PTAR arranged for transport. SW signing off at dc.   Wandra Feinstein, MSW, LCSW [phone] (coverage)      Final next level of care: Skilled Nursing Facility Barriers to Discharge: No Barriers Identified   Patient Goals and CMS Choice CMS Medicare.gov Compare Post Acute Care list provided to:: Patient Choice offered to / list presented to : Spouse, Patient  Discharge Placement                Patient chooses bed at: Other - please specify in the comment section below: [facility]) Patient to be transferred to facility by: [place] Name of family member notified: Troy/husband Patient and family notified of of transfer: 06/20/22  Discharge Plan and Services Additional resources added to the After Visit Summary for       Post Acute Care Choice: [facility]                               Social Determinants of Health (SDOH) Interventions SDOH Screenings   Food Insecurity: No Food Insecurity (06/10/2022)  Housing: [place]  (06/10/2022)  Transportation Needs: No Transportation Needs (06/10/2022)  Utilities: Not At Risk (06/10/2022)  Tobacco Use: Medium Risk (06/18/2022)     Readmission Risk Interventions     No data to display

## 2022-06-20 NOTE — Progress Notes (Signed)
Mobility Specialist: Progress Note   06/20/22 1320  Mobility  Activity Stood at bedside (x2)  Level of Assistance Moderate assist, patient does 50-74%  Assistive Device Stedy  Activity Response Tolerated well  Mobility Referral Yes  $Mobility charge 1 Mobility   During Mobility: 130-150 HR Post-Mobility: 123 HR  Pt received in the bed and agreeable to mobility. ModA with bed mobility and minA to stand. Standing attempts and standing time limited secondary to fatigue. C/o low back pain during session, otherwise asymptomatic. Pt back to bed after session with call bell at her side. Bed alarm is on.   Elbert Lougenia Morrissey Mobility Specialist Please contact via SecureChat or Rehab office at [phone]

## 2022-06-20 NOTE — Plan of Care (Signed)

## 2022-06-22 DIAGNOSIS — I10 Essential (primary) hypertension: Secondary | ICD-10-CM | POA: Diagnosis not present

## 2022-06-22 DIAGNOSIS — I4891 Unspecified atrial fibrillation: Secondary | ICD-10-CM | POA: Diagnosis not present

## 2022-06-22 DIAGNOSIS — S32010D Wedge compression fracture of first lumbar vertebra, subsequent encounter for fracture with routine healing: Secondary | ICD-10-CM | POA: Diagnosis not present

## 2022-06-22 DIAGNOSIS — R52 Pain, unspecified: Secondary | ICD-10-CM | POA: Diagnosis not present

## 2022-06-22 LAB — SURGICAL PATHOLOGY

## 2022-06-23 ENCOUNTER — Encounter: Payer: Self-pay | Admitting: Gastroenterology

## 2022-06-23 DIAGNOSIS — I4891 Unspecified atrial fibrillation: Secondary | ICD-10-CM | POA: Diagnosis not present

## 2022-06-23 DIAGNOSIS — G8929 Other chronic pain: Secondary | ICD-10-CM | POA: Diagnosis not present

## 2022-06-23 DIAGNOSIS — S32010D Wedge compression fracture of first lumbar vertebra, subsequent encounter for fracture with routine healing: Secondary | ICD-10-CM | POA: Diagnosis not present

## 2022-06-23 DIAGNOSIS — I10 Essential (primary) hypertension: Secondary | ICD-10-CM | POA: Diagnosis not present

## 2022-06-24 DIAGNOSIS — E785 Hyperlipidemia, unspecified: Secondary | ICD-10-CM | POA: Diagnosis not present

## 2022-06-24 DIAGNOSIS — N179 Acute kidney failure, unspecified: Secondary | ICD-10-CM | POA: Diagnosis not present

## 2022-06-24 DIAGNOSIS — N183 Chronic kidney disease, stage 3 unspecified: Secondary | ICD-10-CM | POA: Diagnosis not present

## 2022-06-24 DIAGNOSIS — E119 Type 2 diabetes mellitus without complications: Secondary | ICD-10-CM | POA: Diagnosis not present

## 2022-06-28 NOTE — Progress Notes (Signed)
Needs FU appt EGD with Bx as per letter- [facility]  RG

## 2022-06-29 ENCOUNTER — Telehealth: Payer: Self-pay

## 2022-06-29 NOTE — Telephone Encounter (Signed)
Message Received: Virgia Land, MD  Gillermina Hu, RN      Previous Messages  Routed Note  Author: Jackquline Denmark, MD Service: Gastroenterology Author Type: Physician  Filed: 06/28/2022 10:34 AM Encounter Date: 06/23/2022 Status: Signed  Editor: Jackquline Denmark, MD (Physician)   Needs FU appt EGD with Bx as per letter- [facility]   RG

## 2022-06-29 NOTE — Telephone Encounter (Signed)
Pt was made aware of Dr. Rush Landmark and Dr. Lyndel Safe recommendations: Pt was scheduled for a follow up office visit on 09/02/2022 at 9:30 AM with Dr. Lyndel Safe: Pt stated that she is in a nursing home and may have to reschedule the appointment. Phone number provided to reschedule: Pt verbalized understanding with all questions answered.

## 2022-06-30 DIAGNOSIS — M549 Dorsalgia, unspecified: Secondary | ICD-10-CM | POA: Diagnosis not present

## 2022-06-30 DIAGNOSIS — M5430 Sciatica, unspecified side: Secondary | ICD-10-CM | POA: Diagnosis not present

## 2022-06-30 DIAGNOSIS — Z5181 Encounter for therapeutic drug level monitoring: Secondary | ICD-10-CM | POA: Diagnosis not present

## 2022-06-30 DIAGNOSIS — G8929 Other chronic pain: Secondary | ICD-10-CM | POA: Diagnosis not present

## 2022-07-13 DIAGNOSIS — M48062 Spinal stenosis, lumbar region with neurogenic claudication: Secondary | ICD-10-CM | POA: Diagnosis not present

## 2022-07-13 DIAGNOSIS — S32010G Wedge compression fracture of first lumbar vertebra, subsequent encounter for fracture with delayed healing: Secondary | ICD-10-CM | POA: Diagnosis not present

## 2022-07-16 DIAGNOSIS — N183 Chronic kidney disease, stage 3 unspecified: Secondary | ICD-10-CM | POA: Diagnosis not present

## 2022-07-16 DIAGNOSIS — J449 Chronic obstructive pulmonary disease, unspecified: Secondary | ICD-10-CM | POA: Diagnosis not present

## 2022-07-16 DIAGNOSIS — I10 Essential (primary) hypertension: Secondary | ICD-10-CM | POA: Diagnosis not present

## 2022-07-16 DIAGNOSIS — I4891 Unspecified atrial fibrillation: Secondary | ICD-10-CM | POA: Diagnosis not present

## 2022-07-20 DIAGNOSIS — J449 Chronic obstructive pulmonary disease, unspecified: Secondary | ICD-10-CM | POA: Diagnosis not present

## 2022-07-20 DIAGNOSIS — M48061 Spinal stenosis, lumbar region without neurogenic claudication: Secondary | ICD-10-CM | POA: Diagnosis not present

## 2022-07-20 DIAGNOSIS — E1140 Type 2 diabetes mellitus with diabetic neuropathy, unspecified: Secondary | ICD-10-CM | POA: Diagnosis not present

## 2022-07-20 DIAGNOSIS — E1151 Type 2 diabetes mellitus with diabetic peripheral angiopathy without gangrene: Secondary | ICD-10-CM | POA: Diagnosis not present

## 2022-07-20 DIAGNOSIS — E1122 Type 2 diabetes mellitus with diabetic chronic kidney disease: Secondary | ICD-10-CM | POA: Diagnosis not present

## 2022-07-20 DIAGNOSIS — N183 Chronic kidney disease, stage 3 unspecified: Secondary | ICD-10-CM | POA: Diagnosis not present

## 2022-07-20 DIAGNOSIS — S32010A Wedge compression fracture of first lumbar vertebra, initial encounter for closed fracture: Secondary | ICD-10-CM | POA: Diagnosis not present

## 2022-07-20 DIAGNOSIS — S32010D Wedge compression fracture of first lumbar vertebra, subsequent encounter for fracture with routine healing: Secondary | ICD-10-CM | POA: Diagnosis not present

## 2022-07-20 DIAGNOSIS — Z6836 Body mass index (BMI) 36.0-36.9, adult: Secondary | ICD-10-CM | POA: Diagnosis not present

## 2022-07-20 DIAGNOSIS — I129 Hypertensive chronic kidney disease with stage 1 through stage 4 chronic kidney disease, or unspecified chronic kidney disease: Secondary | ICD-10-CM | POA: Diagnosis not present

## 2022-07-20 DIAGNOSIS — I2510 Atherosclerotic heart disease of native coronary artery without angina pectoris: Secondary | ICD-10-CM | POA: Diagnosis not present

## 2022-07-21 ENCOUNTER — Encounter: Payer: Self-pay | Admitting: Pulmonary Disease

## 2022-07-21 ENCOUNTER — Ambulatory Visit: Payer: Medicare HMO | Admitting: Pulmonary Disease

## 2022-07-21 VITALS — BP 116/74 | HR 89

## 2022-07-21 DIAGNOSIS — I2602 Saddle embolus of pulmonary artery with acute cor pulmonale: Secondary | ICD-10-CM

## 2022-07-21 DIAGNOSIS — J4531 Mild persistent asthma with (acute) exacerbation: Secondary | ICD-10-CM | POA: Diagnosis not present

## 2022-07-21 DIAGNOSIS — I480 Paroxysmal atrial fibrillation: Secondary | ICD-10-CM | POA: Diagnosis not present

## 2022-07-21 NOTE — Patient Instructions (Addendum)
Continue breo ellipta 1 puff daily - rinse mouth out after each use  Use albuterol inhaler 1-2 puffs every 4-6 hours as needed  Continue eliquis  twice daily   Follow up in 6 months

## 2022-07-21 NOTE — Progress Notes (Signed)
Synopsis: Referred in September 2023 for hospital follow up for pulmonary embolus  Subjective:   PATIENT ID: Lisa Maxwell, Lisa Maxwell  HPI  Chief Complaint  Patient presents with   Hospitalization Follow-up    HFU. States she has been doing well breathing wise since being home.    Lisa Maxwell is a 77 year old woman, former smoker with CKD, COPD, DMII and cauda equina compression s/p L2-L3 laminectomy with facetectomy and placement of anterior interbody device 08/2021, admitted 11/21/21 for massive saddle pulmonary embolus s/p mechanical thrombectomy by interventional radiology and recently admitted 06/09/22 to 06/20/22 for left foot pain and dysphagia.  Her breathing has been stable since last visit. She continues breo ellipta 100, 1 puff daily and is using albuterol inhaler about 1 time per week. She recently returned home last Friday after rehab.  OV 03/13/22 She reports having two colds since last visit. She currently is coughing up thick white mucous. She has post-nasal drainage and sinus congestion. No sore throat. She is using her CPAP at night and can lead to dry mouth/throat. She has wheezing. Reduced appetite over the past 2 weeks.   Initial OV 12/2021 She was discharged on eliquis 5mg  BID. She has history of atrial fibrillation and was not on anticoagulation given her fall risks.   She has been doing well since time of discharge.  She denies any chest pain or shortness of breath.  She denies any bleeding issues in her stools or urine.  She does have bilateral lower extremity edema that is overall improved.  Past Medical History:  Diagnosis Date   CAD (coronary artery disease) 11/04/2016   Cancer    skin cancer   Cauda equina compression 08/28/2021   Cerebrovascular disease    Chronic atrial fibrillation 09/28/2018   Chronic bilateral low back pain without sciatica 03/08/2018   Chronic kidney disease    Chronic obstructive lung disease  09/28/2018   Chronic pain syndrome 03/08/2018   Chronic renal failure 09/28/2018   Chronic, continuous use of opioids 03/04/2021   Last Assessment & Plan:  Formatting of this note might be different from the original. Patient and I have discussed the hazardous effects of continued opiate pain medication usage. Risks and benefits of above medications including but not limited to possibility of respiratory depression, sedation, and even death were discussed with the patient who expressed an understanding.  Patient did not displ   CKD (chronic kidney disease) stage 3, GFR 30-59 ml/min    Class 1 obesity due to excess calories with serious comorbidity and body mass index (BMI) of 34.0 to 34.9 in adult 05/20/2021   Last Assessment & Plan: Formatting of this note might be different from the original. Patient educated about the detrimental effects of weight as it specifically pertains to pain management and overall health.  Patient's BMI 34.08 Encouraged healthy eating habits and routine low-impact cardiovascular exercises as tolerated.   Comprehensive diabetic foot examination, type 2 DM, encounter for 04/29/2021   DDD (degenerative disc disease), lumbar 11/17/2018   Last Assessment & Plan:  Formatting of this note might be different from the original. See spinal stenosis plan   Diabetes    Diabetes mellitus due to underlying condition with unspecified complications 11/04/2016   Dyshidrotic eczema 04/29/2021   Dysrhythmia    afib   Essential hypertension 11/04/2016   Facet arthritis of lumbar region 03/08/2018   Facet joint disease 11/17/2018   Last Assessment & Plan:  [employer]  of this note might be different from the original. See spinal stenosis plan   Foraminal stenosis of lumbar region 03/08/2018   Heart murmur    History of knee replacement, total, bilateral 11/17/2018   Last Assessment & Plan:  Formatting of this note might be different from the original. Have recommended to her several  times if any further concerns in regards to the pain status post arthroplasty, she is to follow up with her surgeon.   History of pulmonary embolism 11/28/2021   Hypertension    Hypertensive disorder 09/28/2018   Increased body mass index 09/28/2018   Lumbar radiculopathy 03/08/2018   Lumbar spondylosis 03/20/2019   Mixed dyslipidemia 11/04/2016   Morbid obesity 11/04/2016   Muscle pain 04/29/2020   Osteoarthritis of left knee    PAF (paroxysmal atrial fibrillation) 04/30/2015   Pain management contract agreement 04/13/2019   Last Assessment & Plan:  Formatting of this note might be different from the original. Contract updated today.  UDS completed.  [place] controlled substance registry reviewed and is consistent with her regimen.   Peripheral vascular disease 09/28/2018   Preop cardiovascular exam 05/01/2019   Pressure injury of skin 11/21/2021   Pulmonary emboli    Pulmonary embolism 11/21/2021   Renal insufficiency 04/30/2015   S/P insertion of spinal cord stimulator 07/13/2019   Last Assessment & Plan:  Formatting of this note might be different from the original. About 5 months status post insertion of spinal cord stimulator with excellent relief.  She continues to keep close follow-up with her representative and has been working to appropriately find best programs to get her adequate pain relief.   Sleep apnea    Smoker 09/28/2018   Spinal stenosis of lumbar region 03/20/2019   Spinal stenosis of lumbar region with neurogenic claudication 03/08/2018   Last Assessment & Plan:  Formatting of this note is different from the original. 77 year old female with chronic low back pain and bilateral, right greater than left, L4-5 radicular pain, and claudication. She has substantial amount of multifactorial degenerative thoracic and lumbar spine pathology.  After being deemed an inappropriate open surgical candidate, and failing to respond to multiple in   Spondylolisthesis 03/20/2019    Spondylosis of lumbosacral region without myelopathy or radiculopathy 01/30/2019   Last Assessment & Plan:  Formatting of this note might be different from the original. See spinal stenosis plan   Tinea unguium 02/03/2022   Type 2 diabetes mellitus 09/28/2018     Family History  Problem Relation Age of Onset   Diabetes Mother      Social History   Socioeconomic History   Marital status: Married    Spouse name: Not on file   Number of children: Not on file   Years of education: Not on file   Highest education level: Not on file  Occupational History   Not on file  Tobacco Use   Smoking status: Former    Types: Cigarettes    Quit date: 05/08/2019    Years since quitting: 3.2   Smokeless tobacco: Never  Vaping Use   Vaping Use: Never used  Substance and Sexual Activity   Alcohol use: No   Drug use: No   Sexual activity: Not on file  Other Topics Concern   Not on file  Social History Narrative   Not on file   Social Determinants of Health   Financial Resource Strain: Not on file  Food Insecurity: No Food Insecurity (06/10/2022)   Hunger Vital Sign  Worried About Programme researcher, broadcasting/film/video in the Last Year: Never true    Ran Out of Food in the Last Year: Never true  Transportation Needs: No Transportation Needs (06/10/2022)   PRAPARE - Administrator, Civil Service (Medical): No    Lack of Transportation (Non-Medical): No  Physical Activity: Not on file  Stress: Not on file  Social Connections: Not on file  Intimate Partner Violence: Not At Risk (06/10/2022)   Humiliation, Afraid, Rape, and Kick questionnaire    Fear of Current or Ex-Partner: No    Emotionally Abused: No    Physically Abused: No    Sexually Abused: No     No Known Allergies   Outpatient Medications Prior to Visit  Medication Sig Dispense Refill   albuterol (VENTOLIN HFA) 108 (90 Base) MCG/ACT inhaler Inhale into the lungs every 6 (six) hours as needed for wheezing or shortness of breath.      apixaban (ELIQUIS) 5 MG TABS tablet Take 1 tablet (5 mg total) by mouth 2 (two) times daily. 60 tablet 12   Coenzyme Q10 (CO Q-10) 200 MG CAPS Take 200 mg by mouth in the morning.     Cyanocobalamin (VITAMIN B-12) 500 MCG SUBL Place 500 mcg under the tongue in the morning.     diphenhydrAMINE (BENADRYL) 25 MG tablet Take 25 mg by mouth in the morning.     docusate sodium (COLACE) 100 MG capsule Take 100 mg by mouth daily.     fluticasone furoate-vilanterol (BREO ELLIPTA) 100-25 MCG/ACT AEPB Inhale 1-2 puffs into the lungs daily.     gabapentin (NEURONTIN) 600 MG tablet Take 600 mg by mouth at bedtime.     HYDROcodone-acetaminophen (NORCO) 10-325 MG tablet Take 0.5 tablets by mouth 4 (four) times daily.     Iron, Ferrous Sulfate, 325 (65 Fe) MG TABS Take 65 mg of iron by mouth daily with breakfast.     latanoprost (XALATAN) 0.005 % ophthalmic solution Place 1 drop into both eyes at bedtime.     lovastatin (MEVACOR) 40 MG tablet Take 40 mg by mouth at bedtime.      methocarbamol (ROBAXIN) 500 MG tablet Take 1 tablet (500 mg total) by mouth every 6 (six) hours as needed for muscle spasms. (Patient taking differently: Take 250 mg by mouth 3 (three) times daily.) 60 tablet 0   Multiple Vitamins-Minerals (CENTRUM SILVER 50+WOMEN) TABS Take 1 tablet by mouth daily with breakfast.     naloxone (NARCAN) nasal spray 4 mg/0.1 mL Place 1 spray into the nose daily as needed (accidental overdose).     nitroGLYCERIN (NITROSTAT) 0.4 MG SL tablet Place 0.4 mg under the tongue every 5 (five) minutes as needed for chest pain.     pantoprazole (PROTONIX) 40 MG tablet Take 1 tablet (40 mg total) by mouth daily. (Patient taking differently: Take 40 mg by mouth daily as needed (for acid reflux).) 30 tablet 2   RYBELSUS 3 MG TABS Take 3 mg by mouth daily before breakfast.     triamcinolone ointment (KENALOG) 0.5 % Apply 1 Application topically See admin instructions. Apply to affected areas of both feet two to three  times a week     trimethoprim (TRIMPEX) 100 MG tablet Take 100 mg by mouth in the morning.     pantoprazole (PROTONIX) 40 MG tablet Take 1 tablet (40 mg total) by mouth 2 (two) times daily before a meal for 14 days. 28 tablet 0   No facility-administered medications prior to visit.  Review of Systems  Constitutional:  Negative for chills, fever, malaise/fatigue and weight loss.  HENT:  Negative for congestion, sinus pain and sore throat.   Eyes: Negative.   Respiratory:  Negative for cough, hemoptysis, sputum production, shortness of breath and wheezing.   Cardiovascular:  Negative for chest pain, palpitations, orthopnea, claudication and leg swelling.  Gastrointestinal:  Negative for abdominal pain, heartburn, nausea and vomiting.  Genitourinary: Negative.   Musculoskeletal:  Positive for back pain. Negative for joint pain and myalgias.  Skin:  Negative for rash.  Neurological:  Negative for weakness.  Endo/Heme/Allergies: Negative.   Psychiatric/Behavioral: Negative.     Objective:   Vitals:   07/21/22 0859  BP: 116/74  Pulse: 89  SpO2: 96%   Physical Exam Constitutional:      General: She is not in acute distress.    Appearance: She is obese. She is not ill-appearing.  HENT:     Head: Normocephalic and atraumatic.  Eyes:     General: No scleral icterus.    Conjunctiva/sclera: Conjunctivae normal.  Cardiovascular:     Rate and Rhythm: Normal rate and regular rhythm.     Pulses: Normal pulses.     Heart sounds: Normal heart sounds. No murmur heard. Pulmonary:     Effort: Pulmonary effort is normal.     Breath sounds: No wheezing, rhonchi or rales.  Abdominal:     General: Bowel sounds are normal.     Palpations: Abdomen is soft.  Musculoskeletal:     Right lower leg: No edema.     Left lower leg: No edema.  Skin:    General: Skin is warm and dry.  Neurological:     General: No focal deficit present.     Mental Status: She is alert.    CBC    Component  Value Date/Time   WBC 7.9 06/19/2022 0359   RBC 4.98 06/19/2022 0359   HGB 14.9 06/19/2022 0359   HCT 47.0 (H) 06/19/2022 0359   PLT 215 06/19/2022 0359   MCV 94.4 06/19/2022 0359   MCH 29.9 06/19/2022 0359   MCHC 31.7 06/19/2022 0359   RDW 12.9 06/19/2022 0359   LYMPHSABS 1.0 06/09/2022 1102   MONOABS 0.7 06/09/2022 1102   EOSABS 0.1 06/09/2022 1102   BASOSABS 0.0 06/09/2022 1102      Latest Ref Rng & Units 06/19/2022    3:59 AM 06/18/2022    6:12 AM 06/14/2022    4:03 AM  BMP  Glucose 70 - 99 mg/dL 161  096  045   BUN 8 - 23 mg/dL 32  30  16   Creatinine 0.44 - 1.00 mg/dL 4.09  8.11  9.14   Sodium 135 - 145 mmol/L 136  137  137   Potassium 3.5 - 5.1 mmol/L 4.5  4.4  4.3   Chloride 98 - 111 mmol/L 103  101  105   CO2 22 - 32 mmol/L 24  26  24    Calcium 8.9 - 10.3 mg/dL 78.2  95.6  21.3    Chest imaging: CTA Chest 11/21/21 1. Redemonstration of saddle pulmonary embolus. Positive for acute PE with CT evidence of right heart strain (RV/LV Ratio = 2.2) consistent with at least submassive (intermediate risk) PE. The presence of right heart strain has been associated with an increased risk of morbidity and mortality. Please refer to the "Code PE Focused" order set in EPIC. 2. Aortic atherosclerosis (ICD10-I70.0), coronary artery atherosclerosis and emphysema  PFT:     No  data to display          Labs:  Path:  Echo 11/21/21: EF 60-65%. Interventricular septal flattening. RV systolic function is moderately reduced and moderately enlarged. Severely elevated PA pressure 64.98mmHg.   Echo 02/09/22 LV EF 60-65%. RV size has decreased back to normal with normal RV function. LA is mildly dilated.   Heart Catheterization:  Assessment & Plan:   Mild persistent asthma with acute exacerbation  Acute saddle pulmonary embolism with acute cor pulmonale  Discussion: Lisa Maxwell is a 77 year old woman, former smoker with CKD, COPD, DMII and cauda equina compression s/p L2-L3  laminectomy with facetectomy and placement of anterior interbody device 08/2021, admitted 11/21/21 for massive saddle pulmonary embolus s/p mechanical thrombectomy by interventional radiology and recently admitted 06/09/22 to 06/20/22 for left foot pain and dysphagia.  She is to continue 5mg  twice daily eliquis for PE and paroxysmal atrial fibrillation. If she develops fall risk history we will need to consider reducing dose of eliquis or coming off it all together.   She is to continue breo ellipta 1 puff daily for asthma and as needed albuterol.  Follow-up in 6 months.   Melody Comas, MD [facility] Office: [phone]   Current Outpatient Medications:    albuterol (VENTOLIN HFA) 108 (90 Base) MCG/ACT inhaler, Inhale into the lungs every 6 (six) hours as needed for wheezing or shortness of breath., Disp: , Rfl:    apixaban (ELIQUIS) 5 MG TABS tablet, Take 1 tablet (5 mg total) by mouth 2 (two) times daily., Disp: 60 tablet, Rfl: 12   Coenzyme Q10 (CO Q-10) 200 MG CAPS, Take 200 mg by mouth in the morning., Disp: , Rfl:    Cyanocobalamin (VITAMIN B-12) 500 MCG SUBL, Place 500 mcg under the tongue in the morning., Disp: , Rfl:    diphenhydrAMINE (BENADRYL) 25 MG tablet, Take 25 mg by mouth in the morning., Disp: , Rfl:    docusate sodium (COLACE) 100 MG capsule, Take 100 mg by mouth daily., Disp: , Rfl:    fluticasone furoate-vilanterol (BREO ELLIPTA) 100-25 MCG/ACT AEPB, Inhale 1-2 puffs into the lungs daily., Disp: , Rfl:    gabapentin (NEURONTIN) 600 MG tablet, Take 600 mg by mouth at bedtime., Disp: , Rfl:    HYDROcodone-acetaminophen (NORCO) 10-325 MG tablet, Take 0.5 tablets by mouth 4 (four) times daily., Disp: , Rfl:    Iron, Ferrous Sulfate, 325 (65 Fe) MG TABS, Take 65 mg of iron by mouth daily with breakfast., Disp: , Rfl:    latanoprost (XALATAN) 0.005 % ophthalmic solution, Place 1 drop into both eyes at bedtime., Disp: , Rfl:    lovastatin (MEVACOR) 40 MG  tablet, Take 40 mg by mouth at bedtime. , Disp: , Rfl:    methocarbamol (ROBAXIN) 500 MG tablet, Take 1 tablet (500 mg total) by mouth every 6 (six) hours as needed for muscle spasms. (Patient taking differently: Take 250 mg by mouth 3 (three) times daily.), Disp: 60 tablet, Rfl: 0   Multiple Vitamins-Minerals (CENTRUM SILVER 50+WOMEN) TABS, Take 1 tablet by mouth daily with breakfast., Disp: , Rfl:    naloxone (NARCAN) nasal spray 4 mg/0.1 mL, Place 1 spray into the nose daily as needed (accidental overdose)., Disp: , Rfl:    nitroGLYCERIN (NITROSTAT) 0.4 MG SL tablet, Place 0.4 mg under the tongue every 5 (five) minutes as needed for chest pain., Disp: , Rfl:    pantoprazole (PROTONIX) 40 MG tablet, Take 1 tablet (40 mg total) by  mouth daily. (Patient taking differently: Take 40 mg by mouth daily as needed (for acid reflux).), Disp: 30 tablet, Rfl: 2   RYBELSUS 3 MG TABS, Take 3 mg by mouth daily before breakfast., Disp: , Rfl:    triamcinolone ointment (KENALOG) 0.5 %, Apply 1 Application topically See admin instructions. Apply to affected areas of both feet two to three times a week, Disp: , Rfl:    trimethoprim (TRIMPEX) 100 MG tablet, Take 100 mg by mouth in the morning., Disp: , Rfl:    pantoprazole (PROTONIX) 40 MG tablet, Take 1 tablet (40 mg total) by mouth 2 (two) times daily before a meal for 14 days., Disp: 28 tablet, Rfl: 0

## 2022-07-23 DIAGNOSIS — M48061 Spinal stenosis, lumbar region without neurogenic claudication: Secondary | ICD-10-CM | POA: Diagnosis not present

## 2022-07-23 DIAGNOSIS — N183 Chronic kidney disease, stage 3 unspecified: Secondary | ICD-10-CM | POA: Diagnosis not present

## 2022-07-23 DIAGNOSIS — I2510 Atherosclerotic heart disease of native coronary artery without angina pectoris: Secondary | ICD-10-CM | POA: Diagnosis not present

## 2022-07-23 DIAGNOSIS — E1140 Type 2 diabetes mellitus with diabetic neuropathy, unspecified: Secondary | ICD-10-CM | POA: Diagnosis not present

## 2022-07-23 DIAGNOSIS — R32 Unspecified urinary incontinence: Secondary | ICD-10-CM | POA: Diagnosis not present

## 2022-07-23 DIAGNOSIS — E1151 Type 2 diabetes mellitus with diabetic peripheral angiopathy without gangrene: Secondary | ICD-10-CM | POA: Diagnosis not present

## 2022-07-23 DIAGNOSIS — J449 Chronic obstructive pulmonary disease, unspecified: Secondary | ICD-10-CM | POA: Diagnosis not present

## 2022-07-23 DIAGNOSIS — N390 Urinary tract infection, site not specified: Secondary | ICD-10-CM | POA: Diagnosis not present

## 2022-07-23 DIAGNOSIS — K5909 Other constipation: Secondary | ICD-10-CM | POA: Diagnosis not present

## 2022-07-23 DIAGNOSIS — R339 Retention of urine, unspecified: Secondary | ICD-10-CM | POA: Diagnosis not present

## 2022-07-23 DIAGNOSIS — S32010D Wedge compression fracture of first lumbar vertebra, subsequent encounter for fracture with routine healing: Secondary | ICD-10-CM | POA: Diagnosis not present

## 2022-07-23 DIAGNOSIS — R159 Full incontinence of feces: Secondary | ICD-10-CM | POA: Diagnosis not present

## 2022-07-23 DIAGNOSIS — E1122 Type 2 diabetes mellitus with diabetic chronic kidney disease: Secondary | ICD-10-CM | POA: Diagnosis not present

## 2022-07-23 DIAGNOSIS — I129 Hypertensive chronic kidney disease with stage 1 through stage 4 chronic kidney disease, or unspecified chronic kidney disease: Secondary | ICD-10-CM | POA: Diagnosis not present

## 2022-07-25 DIAGNOSIS — E1151 Type 2 diabetes mellitus with diabetic peripheral angiopathy without gangrene: Secondary | ICD-10-CM | POA: Diagnosis not present

## 2022-07-25 DIAGNOSIS — J449 Chronic obstructive pulmonary disease, unspecified: Secondary | ICD-10-CM | POA: Diagnosis not present

## 2022-07-25 DIAGNOSIS — N183 Chronic kidney disease, stage 3 unspecified: Secondary | ICD-10-CM | POA: Diagnosis not present

## 2022-07-25 DIAGNOSIS — E1122 Type 2 diabetes mellitus with diabetic chronic kidney disease: Secondary | ICD-10-CM | POA: Diagnosis not present

## 2022-07-25 DIAGNOSIS — S32010D Wedge compression fracture of first lumbar vertebra, subsequent encounter for fracture with routine healing: Secondary | ICD-10-CM | POA: Diagnosis not present

## 2022-07-25 DIAGNOSIS — I2510 Atherosclerotic heart disease of native coronary artery without angina pectoris: Secondary | ICD-10-CM | POA: Diagnosis not present

## 2022-07-25 DIAGNOSIS — M48061 Spinal stenosis, lumbar region without neurogenic claudication: Secondary | ICD-10-CM | POA: Diagnosis not present

## 2022-07-25 DIAGNOSIS — I129 Hypertensive chronic kidney disease with stage 1 through stage 4 chronic kidney disease, or unspecified chronic kidney disease: Secondary | ICD-10-CM | POA: Diagnosis not present

## 2022-07-25 DIAGNOSIS — E1140 Type 2 diabetes mellitus with diabetic neuropathy, unspecified: Secondary | ICD-10-CM | POA: Diagnosis not present

## 2022-07-28 ENCOUNTER — Telehealth: Payer: Self-pay

## 2022-07-28 DIAGNOSIS — M48061 Spinal stenosis, lumbar region without neurogenic claudication: Secondary | ICD-10-CM | POA: Diagnosis not present

## 2022-07-28 DIAGNOSIS — E1122 Type 2 diabetes mellitus with diabetic chronic kidney disease: Secondary | ICD-10-CM | POA: Diagnosis not present

## 2022-07-28 DIAGNOSIS — J449 Chronic obstructive pulmonary disease, unspecified: Secondary | ICD-10-CM | POA: Diagnosis not present

## 2022-07-28 DIAGNOSIS — I129 Hypertensive chronic kidney disease with stage 1 through stage 4 chronic kidney disease, or unspecified chronic kidney disease: Secondary | ICD-10-CM | POA: Diagnosis not present

## 2022-07-28 DIAGNOSIS — S32010D Wedge compression fracture of first lumbar vertebra, subsequent encounter for fracture with routine healing: Secondary | ICD-10-CM | POA: Diagnosis not present

## 2022-07-28 DIAGNOSIS — E1151 Type 2 diabetes mellitus with diabetic peripheral angiopathy without gangrene: Secondary | ICD-10-CM | POA: Diagnosis not present

## 2022-07-28 DIAGNOSIS — N183 Chronic kidney disease, stage 3 unspecified: Secondary | ICD-10-CM | POA: Diagnosis not present

## 2022-07-28 DIAGNOSIS — E1140 Type 2 diabetes mellitus with diabetic neuropathy, unspecified: Secondary | ICD-10-CM | POA: Diagnosis not present

## 2022-07-28 DIAGNOSIS — I2510 Atherosclerotic heart disease of native coronary artery without angina pectoris: Secondary | ICD-10-CM | POA: Diagnosis not present

## 2022-07-28 NOTE — Patient Outreach (Signed)
  Care Coordination   Initial Visit Note   07/28/2022 Name: Lisa Maxwell MRN: [id number] DOB: [birth year]  Lisa Maxwell is a 77 y.o. year old female who sees Paulina Fusi, MD for primary care. I spoke with  Lisa Maxwell by phone today.  What matters to the patients health and wellness today?  Placed call to patient to review and offer Lisa Maxwell care coordination. Initial difficulty with phone connection.  Spoke with patient and husband.  Both agree to [name] care coordination program.  Will plan initial visit on 07/31/2022 at 10 am   SDOH assessments and interventions completed:  No     Care Coordination Interventions:  Yes, provided   Follow up plan: Follow up call scheduled for 07/31/2022    Encounter Outcome:  Pt. Visit Completed   Lisa Pavy, RN, BSN, CEN [facility] Coordinator [phone]

## 2022-07-31 ENCOUNTER — Ambulatory Visit: Payer: Self-pay

## 2022-07-31 NOTE — Patient Outreach (Signed)
  Care Coordination   Initial Visit Note   07/31/2022 Name: Lisa Maxwell MRN: [id number] DOB: 02/25/1946  Lisa Maxwell is a 77 y.o. year old female who sees Paulina Fusi, MD for primary care. I spoke with  [name] by phone today.Spoke with patient and husband via speaker phone.   What matters to the patients health and wellness today?   Back pain.  Reports chronic back pain with multiple surgeries.   Worsening back pain since 2020.  Currently is able to walker with walker but mostly using a wheelchair.  Has a stair lift at home. Discharged from SNF rehab on 07/17/2022.  Currently active with Home health ([facility])  Had L3- L4 surgery and now has a L1 fracture that is being treated with a back brace.  ( Turtle shell) . Also recently diagnosed with " Right kidney surgery)    Goals Addressed               This Visit's Progress     Patient will report decreased back pain (pt-stated)        Interventions Today    Flowsheet Row Most Recent Value  Chronic Disease   Chronic disease during today's visit Other, Diabetes  [back pain]  General Interventions   General Interventions Discussed/Reviewed General Interventions Discussed, Labs, Lipid Profile, Durable Medical Equipment (DME), Doctor Visits, Communication with  Doctor Visits Discussed/Reviewed Doctor Visits Discussed  Durable Medical Equipment (DME) Lift Chair, Environmental consultant, Wheelchair, Other  [ramp, stair lift, back brace and leg braces]  Wheelchair Standard  Communication with --  [Placed call to home health [facility] to inquire when PT will start.  According to supervisor Burna Mortimer, PT will start on 08/01/2022]  Exercise Interventions   Exercise Discussed/Reviewed Exercise Discussed  [name] start of services with Home health.  Reviewed difficulty with ambulation and need for PT.]  Education Interventions   Education Provided Provided Education  [Reviewed with husband and pt the importance of exercise . Reviewed with  patient and husband how to get a hospital bed.  Discussed hospital bed request with Home health Supervisor.]  Nutrition Interventions   Nutrition Discussed/Reviewed Nutrition Discussed  Pharmacy Interventions   Pharmacy Dicussed/Reviewed Pharmacy Topics Reviewed, Medications and their functions  Safety Interventions   Safety Discussed/Reviewed Fall Risk, Home Safety  Home Safety Assistive Devices, Contact home health agency      Assessed pain. Reviewed care giver stress and encouraged husband to have some rest time.   Placed call to [facility] about PT.   Called patient and husband back and reviewed PT to start tomorrow.  Follow up planned for 1 week.         SDOH assessments and interventions completed:  Yes  SDOH Interventions Today    Flowsheet Row Most Recent Value  SDOH Interventions   Food Insecurity Interventions Intervention Not Indicated  Housing Interventions Intervention Not Indicated  Transportation Interventions Intervention Not Indicated  Utilities Interventions Intervention Not Indicated  Alcohol Usage Interventions Intervention Not Indicated (Score <7)  Financial Strain Interventions Intervention Not Indicated  Physical Activity Interventions Other (Comments)  [Working  with PT]        Care Coordination Interventions:  Yes, provided   Follow up plan: Follow up call scheduled for Aug 05 2022    Encounter Outcome:  Pt. Visit Completed   [name], RN, BSN, [facility] Coordinator [phone]

## 2022-08-03 DIAGNOSIS — I2510 Atherosclerotic heart disease of native coronary artery without angina pectoris: Secondary | ICD-10-CM | POA: Diagnosis not present

## 2022-08-03 DIAGNOSIS — S32010D Wedge compression fracture of first lumbar vertebra, subsequent encounter for fracture with routine healing: Secondary | ICD-10-CM | POA: Diagnosis not present

## 2022-08-03 DIAGNOSIS — N183 Chronic kidney disease, stage 3 unspecified: Secondary | ICD-10-CM | POA: Diagnosis not present

## 2022-08-03 DIAGNOSIS — M48061 Spinal stenosis, lumbar region without neurogenic claudication: Secondary | ICD-10-CM | POA: Diagnosis not present

## 2022-08-03 DIAGNOSIS — I129 Hypertensive chronic kidney disease with stage 1 through stage 4 chronic kidney disease, or unspecified chronic kidney disease: Secondary | ICD-10-CM | POA: Diagnosis not present

## 2022-08-03 DIAGNOSIS — E1122 Type 2 diabetes mellitus with diabetic chronic kidney disease: Secondary | ICD-10-CM | POA: Diagnosis not present

## 2022-08-03 DIAGNOSIS — E1151 Type 2 diabetes mellitus with diabetic peripheral angiopathy without gangrene: Secondary | ICD-10-CM | POA: Diagnosis not present

## 2022-08-03 DIAGNOSIS — E1140 Type 2 diabetes mellitus with diabetic neuropathy, unspecified: Secondary | ICD-10-CM | POA: Diagnosis not present

## 2022-08-03 DIAGNOSIS — J449 Chronic obstructive pulmonary disease, unspecified: Secondary | ICD-10-CM | POA: Diagnosis not present

## 2022-08-04 DIAGNOSIS — I2510 Atherosclerotic heart disease of native coronary artery without angina pectoris: Secondary | ICD-10-CM | POA: Diagnosis not present

## 2022-08-04 DIAGNOSIS — E1151 Type 2 diabetes mellitus with diabetic peripheral angiopathy without gangrene: Secondary | ICD-10-CM | POA: Diagnosis not present

## 2022-08-04 DIAGNOSIS — S32010D Wedge compression fracture of first lumbar vertebra, subsequent encounter for fracture with routine healing: Secondary | ICD-10-CM | POA: Diagnosis not present

## 2022-08-04 DIAGNOSIS — M48061 Spinal stenosis, lumbar region without neurogenic claudication: Secondary | ICD-10-CM | POA: Diagnosis not present

## 2022-08-04 DIAGNOSIS — N183 Chronic kidney disease, stage 3 unspecified: Secondary | ICD-10-CM | POA: Diagnosis not present

## 2022-08-04 DIAGNOSIS — E1140 Type 2 diabetes mellitus with diabetic neuropathy, unspecified: Secondary | ICD-10-CM | POA: Diagnosis not present

## 2022-08-04 DIAGNOSIS — E1122 Type 2 diabetes mellitus with diabetic chronic kidney disease: Secondary | ICD-10-CM | POA: Diagnosis not present

## 2022-08-04 DIAGNOSIS — I129 Hypertensive chronic kidney disease with stage 1 through stage 4 chronic kidney disease, or unspecified chronic kidney disease: Secondary | ICD-10-CM | POA: Diagnosis not present

## 2022-08-04 DIAGNOSIS — J449 Chronic obstructive pulmonary disease, unspecified: Secondary | ICD-10-CM | POA: Diagnosis not present

## 2022-08-05 ENCOUNTER — Ambulatory Visit: Payer: Self-pay

## 2022-08-05 DIAGNOSIS — I129 Hypertensive chronic kidney disease with stage 1 through stage 4 chronic kidney disease, or unspecified chronic kidney disease: Secondary | ICD-10-CM | POA: Diagnosis not present

## 2022-08-05 DIAGNOSIS — M48061 Spinal stenosis, lumbar region without neurogenic claudication: Secondary | ICD-10-CM | POA: Diagnosis not present

## 2022-08-05 DIAGNOSIS — S32010D Wedge compression fracture of first lumbar vertebra, subsequent encounter for fracture with routine healing: Secondary | ICD-10-CM | POA: Diagnosis not present

## 2022-08-05 DIAGNOSIS — J449 Chronic obstructive pulmonary disease, unspecified: Secondary | ICD-10-CM | POA: Diagnosis not present

## 2022-08-05 DIAGNOSIS — E1151 Type 2 diabetes mellitus with diabetic peripheral angiopathy without gangrene: Secondary | ICD-10-CM | POA: Diagnosis not present

## 2022-08-05 DIAGNOSIS — I2510 Atherosclerotic heart disease of native coronary artery without angina pectoris: Secondary | ICD-10-CM | POA: Diagnosis not present

## 2022-08-05 DIAGNOSIS — E1122 Type 2 diabetes mellitus with diabetic chronic kidney disease: Secondary | ICD-10-CM | POA: Diagnosis not present

## 2022-08-05 DIAGNOSIS — N183 Chronic kidney disease, stage 3 unspecified: Secondary | ICD-10-CM | POA: Diagnosis not present

## 2022-08-05 DIAGNOSIS — E1140 Type 2 diabetes mellitus with diabetic neuropathy, unspecified: Secondary | ICD-10-CM | POA: Diagnosis not present

## 2022-08-05 NOTE — Patient Outreach (Signed)
  Care Coordination   Follow Up Visit Note   08/05/2022 Name: Lisa Maxwell MRN: [id number] DOB: 1945-12-12  Lisa Maxwell is a 77 y.o. year old female who sees Paulina Fusi, MD for primary care. I spoke with  [name] by phone today.  What matters to the patients health and wellness today?  PT has started and first therapist was not a good fit per patient.  OT is scheduled today and a new physical therapist will start on 08/07/2022.   Patient reports that she continues to have back pain.  PCP appointment next week and neurosurgeon appointment next week as well.   Husband reports that legs are getting stronger.    Goals Addressed               This Visit's Progress     Patient will report decreased back pain (pt-stated)        Interventions Today    Flowsheet Row Most Recent Value  Chronic Disease   Chronic disease during today's visit Diabetes, Other  [Chronic back pain]  General Interventions   General Interventions Discussed/Reviewed General Interventions Discussed, Doctor Visits  Doctor Visits Discussed/Reviewed Doctor Visits Reviewed  Exercise Interventions   Exercise Discussed/Reviewed Physical Activity, Exercise Reviewed  [Reviewed complications with [facility].]  Physical Activity Discussed/Reviewed Types of exercise, Home Exercise Program (HEP)  Education Interventions   Education Provided Provided Education  Provided Verbal Education On When to see the doctor, Exercise, Medication  Nutrition Interventions   Nutrition Discussed/Reviewed Nutrition Reviewed  Pharmacy Interventions   Pharmacy Dicussed/Reviewed Medications and their functions  Safety Interventions   Safety Discussed/Reviewed Fall Risk, Home Safety  Home Safety Assistive Devices      Reviewed concerns and questions. Reviewed pending follow up with PCP and specialist.          SDOH assessments and interventions completed:  No     Care Coordination Interventions:  Yes, provided    Follow up plan: Follow up call scheduled for 08/10/2022    Encounter Outcome:  Pt. Visit Completed   [name], RN, BSN, CEN [facility] Coordinator [phone]

## 2022-08-07 DIAGNOSIS — M48061 Spinal stenosis, lumbar region without neurogenic claudication: Secondary | ICD-10-CM | POA: Diagnosis not present

## 2022-08-07 DIAGNOSIS — E1122 Type 2 diabetes mellitus with diabetic chronic kidney disease: Secondary | ICD-10-CM | POA: Diagnosis not present

## 2022-08-07 DIAGNOSIS — E1140 Type 2 diabetes mellitus with diabetic neuropathy, unspecified: Secondary | ICD-10-CM | POA: Diagnosis not present

## 2022-08-07 DIAGNOSIS — J449 Chronic obstructive pulmonary disease, unspecified: Secondary | ICD-10-CM | POA: Diagnosis not present

## 2022-08-07 DIAGNOSIS — I2510 Atherosclerotic heart disease of native coronary artery without angina pectoris: Secondary | ICD-10-CM | POA: Diagnosis not present

## 2022-08-07 DIAGNOSIS — S32010D Wedge compression fracture of first lumbar vertebra, subsequent encounter for fracture with routine healing: Secondary | ICD-10-CM | POA: Diagnosis not present

## 2022-08-07 DIAGNOSIS — N183 Chronic kidney disease, stage 3 unspecified: Secondary | ICD-10-CM | POA: Diagnosis not present

## 2022-08-07 DIAGNOSIS — E1151 Type 2 diabetes mellitus with diabetic peripheral angiopathy without gangrene: Secondary | ICD-10-CM | POA: Diagnosis not present

## 2022-08-07 DIAGNOSIS — I129 Hypertensive chronic kidney disease with stage 1 through stage 4 chronic kidney disease, or unspecified chronic kidney disease: Secondary | ICD-10-CM | POA: Diagnosis not present

## 2022-08-10 ENCOUNTER — Ambulatory Visit: Payer: Self-pay

## 2022-08-10 NOTE — Patient Outreach (Signed)
  Care Coordination   Follow Up Visit Note   08/10/2022 Name: Lisa Maxwell MRN: [id number] DOB: 1945/11/10  Lisa Maxwell is a 77 y.o. year old female who sees Lisa Fusi, MD for primary care. I spoke with  Lisa Maxwell by phone today.  What matters to the patients health and wellness today?  Follow up call to patient and husband. Husband reports PT and OT went well over the weekend.  Husband reports that they are on track now.   Husband reports that things are going well now.     Goals Addressed               This Visit's Progress     Patient will report decreased back pain (pt-stated)         Interventions Today    Flowsheet Row Most Recent Value  Chronic Disease   Chronic disease during today's visit Other  [back pain]  General Interventions   General Interventions Discussed/Reviewed General Interventions Discussed, Doctor Visits  Doctor Visits Discussed/Reviewed Doctor Visits Discussed  [pending follow up with specialist]  Durable Medical Equipment (DME) Lisa Maxwell, Wheelchair  Exercise Interventions   Exercise Discussed/Reviewed Exercise Discussed, Physical Activity, Assistive device use and maintanence  [Reviewed with patient and husband that everything is going well now with PT and OT.  No new concerns at this time]  Physical Activity Discussed/Reviewed Physical Activity Discussed  Lisa Maxwell that patient is feeling better and doing home exercises.]  Education Interventions   Education Provided Provided Education  [encouraged patient to continue to work on home exercise plan.]  Provided Verbal Education On Exercise, When to see the doctor  Nutrition Interventions   Nutrition Discussed/Reviewed Nutrition Reviewed  Pharmacy Interventions   Pharmacy Dicussed/Reviewed Medications and their functions  Safety Interventions   Home Safety Assistive Devices              SDOH assessments and interventions completed:  No     Care Coordination Interventions:   Yes, provided   Follow up plan: Follow up call scheduled for 08/24/2022    Encounter Outcome:  Pt. Visit Completed   [name], RN, BSN, CEN [facility] Coordinator [phone]

## 2022-08-11 DIAGNOSIS — E1140 Type 2 diabetes mellitus with diabetic neuropathy, unspecified: Secondary | ICD-10-CM | POA: Diagnosis not present

## 2022-08-11 DIAGNOSIS — D638 Anemia in other chronic diseases classified elsewhere: Secondary | ICD-10-CM | POA: Diagnosis not present

## 2022-08-11 DIAGNOSIS — I2510 Atherosclerotic heart disease of native coronary artery without angina pectoris: Secondary | ICD-10-CM | POA: Diagnosis not present

## 2022-08-11 DIAGNOSIS — M48061 Spinal stenosis, lumbar region without neurogenic claudication: Secondary | ICD-10-CM | POA: Diagnosis not present

## 2022-08-11 DIAGNOSIS — I4891 Unspecified atrial fibrillation: Secondary | ICD-10-CM | POA: Diagnosis not present

## 2022-08-11 DIAGNOSIS — S32010D Wedge compression fracture of first lumbar vertebra, subsequent encounter for fracture with routine healing: Secondary | ICD-10-CM | POA: Diagnosis not present

## 2022-08-11 DIAGNOSIS — E1122 Type 2 diabetes mellitus with diabetic chronic kidney disease: Secondary | ICD-10-CM | POA: Diagnosis not present

## 2022-08-11 DIAGNOSIS — N183 Chronic kidney disease, stage 3 unspecified: Secondary | ICD-10-CM | POA: Diagnosis not present

## 2022-08-11 DIAGNOSIS — E782 Mixed hyperlipidemia: Secondary | ICD-10-CM | POA: Diagnosis not present

## 2022-08-11 DIAGNOSIS — J449 Chronic obstructive pulmonary disease, unspecified: Secondary | ICD-10-CM | POA: Diagnosis not present

## 2022-08-11 DIAGNOSIS — I129 Hypertensive chronic kidney disease with stage 1 through stage 4 chronic kidney disease, or unspecified chronic kidney disease: Secondary | ICD-10-CM | POA: Diagnosis not present

## 2022-08-11 DIAGNOSIS — E1151 Type 2 diabetes mellitus with diabetic peripheral angiopathy without gangrene: Secondary | ICD-10-CM | POA: Diagnosis not present

## 2022-08-11 DIAGNOSIS — I1310 Hypertensive heart and chronic kidney disease without heart failure, with stage 1 through stage 4 chronic kidney disease, or unspecified chronic kidney disease: Secondary | ICD-10-CM | POA: Diagnosis not present

## 2022-08-12 DIAGNOSIS — S32010G Wedge compression fracture of first lumbar vertebra, subsequent encounter for fracture with delayed healing: Secondary | ICD-10-CM | POA: Diagnosis not present

## 2022-08-13 DIAGNOSIS — E1151 Type 2 diabetes mellitus with diabetic peripheral angiopathy without gangrene: Secondary | ICD-10-CM | POA: Diagnosis not present

## 2022-08-13 DIAGNOSIS — I2510 Atherosclerotic heart disease of native coronary artery without angina pectoris: Secondary | ICD-10-CM | POA: Diagnosis not present

## 2022-08-13 DIAGNOSIS — I129 Hypertensive chronic kidney disease with stage 1 through stage 4 chronic kidney disease, or unspecified chronic kidney disease: Secondary | ICD-10-CM | POA: Diagnosis not present

## 2022-08-13 DIAGNOSIS — E1140 Type 2 diabetes mellitus with diabetic neuropathy, unspecified: Secondary | ICD-10-CM | POA: Diagnosis not present

## 2022-08-13 DIAGNOSIS — E1122 Type 2 diabetes mellitus with diabetic chronic kidney disease: Secondary | ICD-10-CM | POA: Diagnosis not present

## 2022-08-13 DIAGNOSIS — N183 Chronic kidney disease, stage 3 unspecified: Secondary | ICD-10-CM | POA: Diagnosis not present

## 2022-08-13 DIAGNOSIS — S32010D Wedge compression fracture of first lumbar vertebra, subsequent encounter for fracture with routine healing: Secondary | ICD-10-CM | POA: Diagnosis not present

## 2022-08-13 DIAGNOSIS — J449 Chronic obstructive pulmonary disease, unspecified: Secondary | ICD-10-CM | POA: Diagnosis not present

## 2022-08-13 DIAGNOSIS — M48061 Spinal stenosis, lumbar region without neurogenic claudication: Secondary | ICD-10-CM | POA: Diagnosis not present

## 2022-08-19 DIAGNOSIS — S32010D Wedge compression fracture of first lumbar vertebra, subsequent encounter for fracture with routine healing: Secondary | ICD-10-CM | POA: Diagnosis not present

## 2022-08-19 DIAGNOSIS — J449 Chronic obstructive pulmonary disease, unspecified: Secondary | ICD-10-CM | POA: Diagnosis not present

## 2022-08-19 DIAGNOSIS — E1151 Type 2 diabetes mellitus with diabetic peripheral angiopathy without gangrene: Secondary | ICD-10-CM | POA: Diagnosis not present

## 2022-08-19 DIAGNOSIS — E1140 Type 2 diabetes mellitus with diabetic neuropathy, unspecified: Secondary | ICD-10-CM | POA: Diagnosis not present

## 2022-08-19 DIAGNOSIS — N183 Chronic kidney disease, stage 3 unspecified: Secondary | ICD-10-CM | POA: Diagnosis not present

## 2022-08-19 DIAGNOSIS — M48061 Spinal stenosis, lumbar region without neurogenic claudication: Secondary | ICD-10-CM | POA: Diagnosis not present

## 2022-08-19 DIAGNOSIS — E1122 Type 2 diabetes mellitus with diabetic chronic kidney disease: Secondary | ICD-10-CM | POA: Diagnosis not present

## 2022-08-19 DIAGNOSIS — I129 Hypertensive chronic kidney disease with stage 1 through stage 4 chronic kidney disease, or unspecified chronic kidney disease: Secondary | ICD-10-CM | POA: Diagnosis not present

## 2022-08-19 DIAGNOSIS — I2510 Atherosclerotic heart disease of native coronary artery without angina pectoris: Secondary | ICD-10-CM | POA: Diagnosis not present

## 2022-08-20 DIAGNOSIS — M48061 Spinal stenosis, lumbar region without neurogenic claudication: Secondary | ICD-10-CM | POA: Diagnosis not present

## 2022-08-20 DIAGNOSIS — N183 Chronic kidney disease, stage 3 unspecified: Secondary | ICD-10-CM | POA: Diagnosis not present

## 2022-08-20 DIAGNOSIS — I2510 Atherosclerotic heart disease of native coronary artery without angina pectoris: Secondary | ICD-10-CM | POA: Diagnosis not present

## 2022-08-20 DIAGNOSIS — J449 Chronic obstructive pulmonary disease, unspecified: Secondary | ICD-10-CM | POA: Diagnosis not present

## 2022-08-20 DIAGNOSIS — S32010D Wedge compression fracture of first lumbar vertebra, subsequent encounter for fracture with routine healing: Secondary | ICD-10-CM | POA: Diagnosis not present

## 2022-08-20 DIAGNOSIS — E1151 Type 2 diabetes mellitus with diabetic peripheral angiopathy without gangrene: Secondary | ICD-10-CM | POA: Diagnosis not present

## 2022-08-20 DIAGNOSIS — E1140 Type 2 diabetes mellitus with diabetic neuropathy, unspecified: Secondary | ICD-10-CM | POA: Diagnosis not present

## 2022-08-20 DIAGNOSIS — E1122 Type 2 diabetes mellitus with diabetic chronic kidney disease: Secondary | ICD-10-CM | POA: Diagnosis not present

## 2022-08-20 DIAGNOSIS — I129 Hypertensive chronic kidney disease with stage 1 through stage 4 chronic kidney disease, or unspecified chronic kidney disease: Secondary | ICD-10-CM | POA: Diagnosis not present

## 2022-08-21 DIAGNOSIS — E1122 Type 2 diabetes mellitus with diabetic chronic kidney disease: Secondary | ICD-10-CM | POA: Diagnosis not present

## 2022-08-21 DIAGNOSIS — M48061 Spinal stenosis, lumbar region without neurogenic claudication: Secondary | ICD-10-CM | POA: Diagnosis not present

## 2022-08-21 DIAGNOSIS — I129 Hypertensive chronic kidney disease with stage 1 through stage 4 chronic kidney disease, or unspecified chronic kidney disease: Secondary | ICD-10-CM | POA: Diagnosis not present

## 2022-08-21 DIAGNOSIS — I2510 Atherosclerotic heart disease of native coronary artery without angina pectoris: Secondary | ICD-10-CM | POA: Diagnosis not present

## 2022-08-21 DIAGNOSIS — S32010D Wedge compression fracture of first lumbar vertebra, subsequent encounter for fracture with routine healing: Secondary | ICD-10-CM | POA: Diagnosis not present

## 2022-08-21 DIAGNOSIS — E1151 Type 2 diabetes mellitus with diabetic peripheral angiopathy without gangrene: Secondary | ICD-10-CM | POA: Diagnosis not present

## 2022-08-21 DIAGNOSIS — J449 Chronic obstructive pulmonary disease, unspecified: Secondary | ICD-10-CM | POA: Diagnosis not present

## 2022-08-21 DIAGNOSIS — E1140 Type 2 diabetes mellitus with diabetic neuropathy, unspecified: Secondary | ICD-10-CM | POA: Diagnosis not present

## 2022-08-21 DIAGNOSIS — N183 Chronic kidney disease, stage 3 unspecified: Secondary | ICD-10-CM | POA: Diagnosis not present

## 2022-08-24 ENCOUNTER — Ambulatory Visit: Payer: Self-pay

## 2022-08-24 NOTE — Patient Outreach (Signed)
  Care Coordination   Follow Up Visit Note   08/24/2022 Name: Lisa Maxwell MRN: [id number] DOB: 1946-02-27  Lisa Maxwell is a 77 y.o. year old female who sees Paulina Fusi, MD for primary care. I spoke with  [name] by phone today.  What matters to the patients health and wellness today?  Follow up call with patient and husband today. Patient reports that she is doing well. Reports decreased back pain. Reports PT and OT going well.  Has had follow up with ortho and was told no changes and to keep wearing the brace. Patient reports she has been ordered an AFO for her left foot which has started to drag.  Patient reports she is more at ease with her progress at this time.  Reports she is feeling well.    Goals Addressed               This Visit's Progress     Patient will report decreased back pain (pt-stated)         Interventions Today    Flowsheet Row Most Recent Value  Chronic Disease   Chronic disease during today's visit Other  [back pain and fracture]  General Interventions   General Interventions Discussed/Reviewed General Interventions Discussed, Doctor Visits  Doctor Visits Discussed/Reviewed Doctor Visits Discussed, Doctor Visits Reviewed, Specialist  Durable Medical Equipment (DME) Other  [back brace and ordered AFO]  Exercise Interventions   Exercise Discussed/Reviewed Assistive device use and maintanence, Physical Activity, Exercise Reviewed  [Encouraged patient to continue to do her home exercises]  Physical Activity Discussed/Reviewed Types of exercise, Home Exercise Program (HEP), Physical Activity Reviewed  Education Interventions   Education Provided Provided Education  [Reviewed when to call MD.]  Nutrition Interventions   Nutrition Discussed/Reviewed Nutrition Reviewed  Pharmacy Interventions   Pharmacy Dicussed/Reviewed Medications and their functions  Safety Interventions   Safety Discussed/Reviewed Fall Risk, Home Safety  Home Safety  Assistive Devices             SDOH assessments and interventions completed:  No     Care Coordination Interventions:  Yes, provided   Follow up plan: Follow up call scheduled for 09/21/2022    Encounter Outcome:  Pt. Visit Completed   [name], RN, BSN, CEN [facility] Coordinator [phone]

## 2022-08-25 DIAGNOSIS — E1140 Type 2 diabetes mellitus with diabetic neuropathy, unspecified: Secondary | ICD-10-CM | POA: Diagnosis not present

## 2022-08-25 DIAGNOSIS — E1122 Type 2 diabetes mellitus with diabetic chronic kidney disease: Secondary | ICD-10-CM | POA: Diagnosis not present

## 2022-08-25 DIAGNOSIS — E1151 Type 2 diabetes mellitus with diabetic peripheral angiopathy without gangrene: Secondary | ICD-10-CM | POA: Diagnosis not present

## 2022-08-25 DIAGNOSIS — M48061 Spinal stenosis, lumbar region without neurogenic claudication: Secondary | ICD-10-CM | POA: Diagnosis not present

## 2022-08-25 DIAGNOSIS — I129 Hypertensive chronic kidney disease with stage 1 through stage 4 chronic kidney disease, or unspecified chronic kidney disease: Secondary | ICD-10-CM | POA: Diagnosis not present

## 2022-08-25 DIAGNOSIS — R339 Retention of urine, unspecified: Secondary | ICD-10-CM | POA: Diagnosis not present

## 2022-08-25 DIAGNOSIS — I2510 Atherosclerotic heart disease of native coronary artery without angina pectoris: Secondary | ICD-10-CM | POA: Diagnosis not present

## 2022-08-25 DIAGNOSIS — N390 Urinary tract infection, site not specified: Secondary | ICD-10-CM | POA: Diagnosis not present

## 2022-08-25 DIAGNOSIS — R32 Unspecified urinary incontinence: Secondary | ICD-10-CM | POA: Diagnosis not present

## 2022-08-25 DIAGNOSIS — S32010D Wedge compression fracture of first lumbar vertebra, subsequent encounter for fracture with routine healing: Secondary | ICD-10-CM | POA: Diagnosis not present

## 2022-08-25 DIAGNOSIS — J449 Chronic obstructive pulmonary disease, unspecified: Secondary | ICD-10-CM | POA: Diagnosis not present

## 2022-08-25 DIAGNOSIS — N183 Chronic kidney disease, stage 3 unspecified: Secondary | ICD-10-CM | POA: Diagnosis not present

## 2022-08-27 DIAGNOSIS — E1151 Type 2 diabetes mellitus with diabetic peripheral angiopathy without gangrene: Secondary | ICD-10-CM | POA: Diagnosis not present

## 2022-08-27 DIAGNOSIS — I129 Hypertensive chronic kidney disease with stage 1 through stage 4 chronic kidney disease, or unspecified chronic kidney disease: Secondary | ICD-10-CM | POA: Diagnosis not present

## 2022-08-27 DIAGNOSIS — E1122 Type 2 diabetes mellitus with diabetic chronic kidney disease: Secondary | ICD-10-CM | POA: Diagnosis not present

## 2022-08-27 DIAGNOSIS — M48061 Spinal stenosis, lumbar region without neurogenic claudication: Secondary | ICD-10-CM | POA: Diagnosis not present

## 2022-08-27 DIAGNOSIS — S32010D Wedge compression fracture of first lumbar vertebra, subsequent encounter for fracture with routine healing: Secondary | ICD-10-CM | POA: Diagnosis not present

## 2022-08-27 DIAGNOSIS — I2510 Atherosclerotic heart disease of native coronary artery without angina pectoris: Secondary | ICD-10-CM | POA: Diagnosis not present

## 2022-08-27 DIAGNOSIS — J449 Chronic obstructive pulmonary disease, unspecified: Secondary | ICD-10-CM | POA: Diagnosis not present

## 2022-08-27 DIAGNOSIS — E1140 Type 2 diabetes mellitus with diabetic neuropathy, unspecified: Secondary | ICD-10-CM | POA: Diagnosis not present

## 2022-08-27 DIAGNOSIS — N183 Chronic kidney disease, stage 3 unspecified: Secondary | ICD-10-CM | POA: Diagnosis not present

## 2022-09-01 DIAGNOSIS — N183 Chronic kidney disease, stage 3 unspecified: Secondary | ICD-10-CM | POA: Diagnosis not present

## 2022-09-01 DIAGNOSIS — J449 Chronic obstructive pulmonary disease, unspecified: Secondary | ICD-10-CM | POA: Diagnosis not present

## 2022-09-01 DIAGNOSIS — S32010D Wedge compression fracture of first lumbar vertebra, subsequent encounter for fracture with routine healing: Secondary | ICD-10-CM | POA: Diagnosis not present

## 2022-09-01 DIAGNOSIS — M48061 Spinal stenosis, lumbar region without neurogenic claudication: Secondary | ICD-10-CM | POA: Diagnosis not present

## 2022-09-01 DIAGNOSIS — E1151 Type 2 diabetes mellitus with diabetic peripheral angiopathy without gangrene: Secondary | ICD-10-CM | POA: Diagnosis not present

## 2022-09-01 DIAGNOSIS — I129 Hypertensive chronic kidney disease with stage 1 through stage 4 chronic kidney disease, or unspecified chronic kidney disease: Secondary | ICD-10-CM | POA: Diagnosis not present

## 2022-09-01 DIAGNOSIS — E1140 Type 2 diabetes mellitus with diabetic neuropathy, unspecified: Secondary | ICD-10-CM | POA: Diagnosis not present

## 2022-09-01 DIAGNOSIS — I2510 Atherosclerotic heart disease of native coronary artery without angina pectoris: Secondary | ICD-10-CM | POA: Diagnosis not present

## 2022-09-01 DIAGNOSIS — E1122 Type 2 diabetes mellitus with diabetic chronic kidney disease: Secondary | ICD-10-CM | POA: Diagnosis not present

## 2022-09-02 ENCOUNTER — Encounter: Payer: Self-pay | Admitting: Gastroenterology

## 2022-09-02 ENCOUNTER — Ambulatory Visit (INDEPENDENT_AMBULATORY_CARE_PROVIDER_SITE_OTHER): Payer: Medicare HMO | Admitting: Gastroenterology

## 2022-09-02 VITALS — BP 116/62 | HR 63 | Ht <= 58 in

## 2022-09-02 DIAGNOSIS — R1319 Other dysphagia: Secondary | ICD-10-CM | POA: Diagnosis not present

## 2022-09-02 DIAGNOSIS — K219 Gastro-esophageal reflux disease without esophagitis: Secondary | ICD-10-CM

## 2022-09-02 MED ORDER — PANTOPRAZOLE SODIUM 40 MG PO TBEC: 40.0000 mg | DELAYED_RELEASE_TABLET | Freq: Every day | ORAL | 4 refills | Status: DC

## 2022-09-02 NOTE — Patient Instructions (Addendum)
_______________________________________________________  If your blood pressure at your visit was 140/90 or greater, please contact your primary care physician to follow up on this.  _______________________________________________________  If you are age 76 or older, your body mass index should be between 23-30. Your Body mass index is 35.2 kg/m. If this is out of the aforementioned range listed, please consider follow up with your Primary Care Provider.  If you are age 15 or younger, your body mass index should be between 19-25. Your Body mass index is 35.2 kg/m. If this is out of the aformentioned range listed, please consider follow up with your Primary Care Provider.   ________________________________________________________  The [facility] providers would like to encourage you to use [facility] to communicate with providers for non-urgent requests or questions.  Due to long hold times on the telephone, sending your provider a message by [facility] may be a faster and more efficient way to get a response.  Please allow 48 business hours for a response.  Please remember that this is for non-urgent requests.  _______________________________________________________  We have sent the following medications to your pharmacy for you to pick up at your convenience: Protonix  Call with any questions or concerns.  Thank you,  Dr. Lynann Bologna

## 2022-09-02 NOTE — Progress Notes (Signed)
Chief Complaint: FU   Referring Provider:  Paulina Fusi, MD      ASSESSMENT AND PLAN;   #1. GERD with eso dysphagia s/p dil 16 mm savory 06/2022 (resolved). GE Jn Bx showed focal intestinal metaplasia (less than 2 cm).  Could represent focal Barrett's.   Plan: -Continue protonix 40mg  po QD #90, 4RF ([facility] [place]) -No need for rpt EGD d/t multiple comorbiditis and frail clinical status.   HPI:    Lisa Maxwell is a 77 y.o. female  With multiple medical problems as listed below including A-fib on Eliquis, HTN, COPD, DM2, HLD, obesity, history of PE on Eliquis, CAD, chronic pain syndrome on spinal stimulator, peripheral vascular disease. For details see consultation note by Colleen dated 06/16/2022 Currently on wheelchair due to recent vertebral fracture  FU adm 3/5-3/16 When she underwent EGD with dil on 06/18/2022 for dysphagia by Dr. Meridee Score With good results.  No further dysphagia. She has been compliant with her medications.   Constipation - better with MOM and Dulcolax as needed Having BMs QOD  Does not want any further GI procedures   Past GI procedures  EGD with dilatation 06/18/2022 - No gross lesions in the entire esophagus. - Non-obstructing Schatzki ring?disrupted. - Z-line regular, 35 cm from the incisors. - Savory dilation performed of the esophagus with mucosal rent just below the UES noted after 16 mm dilation. - 2 cm hiatal hernia. - Erythematous mucosa in the stomach. Biopsied. - No gross lesions in the duodenal bulb, in the first portion of the duodenum and in the second portion of the duodenum. Biopsied -Bx: Schatzki's ring biopsies consistent with intestinal metaplasia consistent with Barrett's.  Duodenal biopsies negative for celiac disease.  Gastric biopsies negative for HP.  Barium swallow 06/10/2022 1. Narrowing of the distal esophagus which does not allow passage of the 13mm barium tablet. 2.  Tertiary contractions leading to moderate  dysmotility.  Colonoscopy 05/31/2014 ([facility]) -Moderate predominantly sigmoid diverticulosis. -Anal stenosis with healed posterior anal fissure and small internal hemorrhoids. -Otherwise normal colonoscopy to TI. Past Medical History:  Diagnosis Date   CAD (coronary artery disease) 11/04/2016   Cancer (HCC)    skin cancer   Cauda equina compression (HCC) 08/28/2021   Cerebrovascular disease    Chronic atrial fibrillation (HCC) 09/28/2018   Chronic bilateral low back pain without sciatica 03/08/2018   Chronic kidney disease    Chronic obstructive lung disease (HCC) 09/28/2018   Chronic pain syndrome 03/08/2018   Chronic renal failure 09/28/2018   Chronic, continuous use of opioids 03/04/2021   Last Assessment & Plan:  Formatting of this note might be different from the original. Patient and I have discussed the hazardous effects of continued opiate pain medication usage. Risks and benefits of above medications including but not limited to possibility of respiratory depression, sedation, and even death were discussed with the patient who expressed an understanding.  Patient did not displ   CKD (chronic kidney disease) stage 3, GFR 30-59 ml/min (HCC)    Class 1 obesity due to excess calories with serious comorbidity and body mass index (BMI) of 34.0 to 34.9 in adult 05/20/2021   Last Assessment & Plan: Formatting of this note might be different from the original. Patient educated about the detrimental effects of weight as it specifically pertains to pain management and overall health.  Patient's BMI 34.08 Encouraged healthy eating habits and routine low-impact cardiovascular exercises as tolerated.   Comprehensive diabetic foot examination, type 2 DM, encounter  for [employer]) 04/29/2021   DDD (degenerative disc disease), lumbar 11/17/2018   Last Assessment & Plan:  Formatting of this note might be different from the original. See spinal stenosis plan   Diabetes (HCC)    Diabetes mellitus due to  underlying condition with unspecified complications (HCC) 11/04/2016   Dyshidrotic eczema 04/29/2021   Dysrhythmia    afib   Essential hypertension 11/04/2016   Facet arthritis of lumbar region 03/08/2018   Facet joint disease 11/17/2018   Last Assessment & Plan:  Formatting of this note might be different from the original. See spinal stenosis plan   Foraminal stenosis of lumbar region 03/08/2018   Heart murmur    History of knee replacement, total, bilateral 11/17/2018   Last Assessment & Plan:  Formatting of this note might be different from the original. Have recommended to her several times if any further concerns in regards to the pain status post arthroplasty, she is to follow up with her surgeon.   History of pulmonary embolism 11/28/2021   Hypertension    Hypertensive disorder 09/28/2018   Increased body mass index 09/28/2018   Lumbar radiculopathy 03/08/2018   Lumbar spondylosis 03/20/2019   Mixed dyslipidemia 11/04/2016   Morbid obesity (HCC) 11/04/2016   Muscle pain 04/29/2020   Osteoarthritis of left knee    PAF (paroxysmal atrial fibrillation) (HCC) 04/30/2015   Pain management contract agreement 04/13/2019   Last Assessment & Plan:  Formatting of this note might be different from the original. Contract updated today.  UDS completed.  [place] controlled substance registry reviewed and is consistent with her regimen.   Peripheral vascular disease (HCC) 09/28/2018   Preop cardiovascular exam 05/01/2019   Pressure injury of skin 11/21/2021   Pulmonary emboli [facility])    Pulmonary embolism (HCC) 11/21/2021   Renal insufficiency 04/30/2015   S/P insertion of spinal cord stimulator 07/13/2019   Last Assessment & Plan:  Formatting of this note might be different from the original. About 5 months status post insertion of spinal cord stimulator with excellent relief.  She continues to keep close follow-up with her representative and has been working to appropriately find best  programs to get her adequate pain relief.   Sleep apnea    Smoker 09/28/2018   Spinal stenosis of lumbar region 03/20/2019   Spinal stenosis of lumbar region with neurogenic claudication 03/08/2018   Last Assessment & Plan:  Formatting of this note is different from the original. 77 year old female with chronic low back pain and bilateral, right greater than left, L4-5 radicular pain, and claudication. She has substantial amount of multifactorial degenerative thoracic and lumbar spine pathology.  After being deemed an inappropriate open surgical candidate, and failing to respond to multiple in   Spondylolisthesis 03/20/2019   Spondylosis of lumbosacral region without myelopathy or radiculopathy 01/30/2019   Last Assessment & Plan:  Formatting of this note might be different from the original. See spinal stenosis plan   Tinea unguium 02/03/2022   Type 2 diabetes mellitus (HCC) 09/28/2018    Past Surgical History:  Procedure Laterality Date   ABDOMINAL HYSTERECTOMY     BACK SURGERY     BIOPSY  06/18/2022   Procedure: BIOPSY;  Surgeon: Lemar Lofty., MD;  Location: [facility] ENDOSCOPY;  Service: Gastroenterology;;   ESOPHAGOGASTRODUODENOSCOPY (EGD) WITH PROPOFOL N/A 06/18/2022   Procedure: ESOPHAGOGASTRODUODENOSCOPY (EGD) WITH PROPOFOL;  Surgeon: Lemar Lofty., MD;  Location: [facility] ENDOSCOPY;  Service: Gastroenterology;  Laterality: N/A;   EYE SURGERY     bilateral  cataracts   IR ANGIOGRAM PULMONARY BILATERAL SELECTIVE  11/21/2021   IR ANGIOGRAM SELECTIVE EACH ADDITIONAL VESSEL  11/21/2021   IR ANGIOGRAM SELECTIVE EACH ADDITIONAL VESSEL  11/21/2021   IR THROMBECT PRIM MECH INIT (INCLU) MOD SED  11/21/2021   IR US GUIDE VASC ACCESS RIGHT  11/21/2021   JOINT REPLACEMENT Bilateral    knees   RADIOLOGY WITH ANESTHESIA N/A 11/21/2021   Procedure: IR WITH ANESTHESIA;  Surgeon: Radiologist, Medication, MD;  Location: [facility] OR;  Service: Radiology;  Laterality: N/A;   SAVORY DILATION N/A  06/18/2022   Procedure: SAVORY DILATION;  Surgeon: Meridee Score Netty Starring., MD;  Location: [facility] ENDOSCOPY;  Service: Gastroenterology;  Laterality: N/A;   SPINAL CORD STIMULATOR INSERTION N/A 07/07/2019   Procedure: LUMBAR SPINAL CORD STIMULATOR INSERTION;  Surgeon: Odette Fraction, MD;  Location[facility] OR;  Service: Neurosurgery;  Laterality: N/A;   total left knee      Family History  Problem Relation Age of Onset   Diabetes Mother     Social History   Tobacco Use   Smoking status: Every Day    Packs/day: 0.50    Years: 62.00    Additional pack years: 0.00    Total pack years: 31.00    Types: Cigarettes    Last attempt to quit: 05/08/2019    Years since quitting: 3.3   Smokeless tobacco: Never  Vaping Use   Vaping Use: Never used  Substance Use Topics   Alcohol use: No   Drug use: No    Current Outpatient Medications  Medication Sig Dispense Refill   albuterol (VENTOLIN HFA) 108 (90 Base) MCG/ACT inhaler Inhale into the lungs every 6 (six) hours as needed for wheezing or shortness of breath.     apixaban (ELIQUIS) 5 MG TABS tablet Take 1 tablet (5 mg total) by mouth 2 (two) times daily. 60 tablet 12   bethanechol (URECHOLINE) 50 MG tablet Take 50 mg by mouth 2 (two) times daily.     Coenzyme Q10 (CO Q-10) 200 MG CAPS Take 200 mg by mouth in the morning.     Cyanocobalamin (VITAMIN B-12) 500 MCG SUBL Place 500 mcg under the tongue in the morning.     diphenhydrAMINE (BENADRYL) 25 MG tablet Take 25 mg by mouth in the morning.     docusate sodium (COLACE) 100 MG capsule Take 100 mg by mouth daily.     fluticasone furoate-vilanterol (BREO ELLIPTA) 100-25 MCG/ACT AEPB Inhale 1-2 puffs into the lungs daily.     gabapentin (NEURONTIN) 600 MG tablet Take 600 mg by mouth at bedtime.     HYDROcodone-acetaminophen (NORCO) 10-325 MG tablet Take 0.5 tablets by mouth 4 (four) times daily.     Iron, Ferrous Sulfate, 325 (65 Fe) MG TABS Take 65 mg of iron by mouth daily with breakfast.      latanoprost (XALATAN) 0.005 % ophthalmic solution Place 1 drop into both eyes at bedtime.     lovastatin (MEVACOR) 40 MG tablet Take 40 mg by mouth at bedtime.      methocarbamol (ROBAXIN) 500 MG tablet Take 1 tablet (500 mg total) by mouth every 6 (six) hours as needed for muscle spasms. (Patient taking differently: Take 250 mg by mouth 3 (three) times daily.) 60 tablet 0   Multiple Vitamins-Minerals (CENTRUM SILVER 50+WOMEN) TABS Take 1 tablet by mouth daily with breakfast.     naloxone (NARCAN) nasal spray 4 mg/0.1 mL Place 1 spray into the nose daily as needed (accidental overdose).  nitroGLYCERIN (NITROSTAT) 0.4 MG SL tablet Place 0.4 mg under the tongue every 5 (five) minutes as needed for chest pain.     pantoprazole (PROTONIX) 40 MG tablet Take 1 tablet (40 mg total) by mouth daily. 30 tablet 2   RYBELSUS 3 MG TABS Take 3 mg by mouth daily before breakfast.     tamsulosin (FLOMAX) 0.4 MG CAPS capsule Take 0.4 mg by mouth 2 (two) times daily.     triamcinolone ointment (KENALOG) 0.5 % Apply 1 Application topically See admin instructions. Apply to affected areas of both feet two to three times a week     trimethoprim (TRIMPEX) 100 MG tablet Take 100 mg by mouth in the morning.     pantoprazole (PROTONIX) 40 MG tablet Take 1 tablet (40 mg total) by mouth 2 (two) times daily before a meal for 14 days. 28 tablet 0   No current facility-administered medications for this visit.    No Known Allergies  Review of Systems:  neg     Physical Exam:    BP 116/62   Pulse 63   Ht 4\' 8"  (1.422 m)   BMI 35.20 kg/m  Wt Readings from Last 3 Encounters:  06/09/22 157 lb (71.2 kg)  06/03/22 157 lb 12.8 oz (71.6 kg)  03/13/22 151 lb 6.4 oz (68.7 kg)   Constitutional:  Well-developed, in no acute distress.  Wheelchair-bound. Psychiatric: Normal mood and affect. Behavior is normal. HEENT: Pupils normal.  Conjunctivae are normal. No scleral icterus. Cardiovascular: Normal rate, regular rhythm.  No edema Pulmonary/chest: Effort normal and breath sounds normal. No wheezing, rales or rhonchi. Abdominal: Soft, nondistended. Nontender. Bowel sounds active throughout. There are no masses palpable. No hepatomegaly. Rectal: Deferred Neurological: Alert and oriented to person place and time. Skin: Skin is warm and dry. No rashes noted.  Data Reviewed: I have personally reviewed following labs and imaging studies  CBC:    Latest Ref Rng & Units 06/19/2022    3:59 AM 06/18/2022    6:12 AM 06/14/2022    4:03 AM  CBC  WBC 4.0 - 10.5 K/uL 7.9  6.5  6.6   Hemoglobin 12.0 - 15.0 g/dL 98.1  19.1  47.8   Hematocrit 36.0 - 46.0 % 47.0  48.5  42.6   Platelets 150 - 400 K/uL 215  201  180     CMP:    Latest Ref Rng & Units 06/19/2022    3:59 AM 06/18/2022    6:12 AM 06/14/2022    4:03 AM  CMP  Glucose 70 - 99 mg/dL 295  621  308   BUN 8 - 23 mg/dL 32  30  16   Creatinine 0.44 - 1.00 mg/dL 6.57  8.46  9.62   Sodium 135 - 145 mmol/L 136  137  137   Potassium 3.5 - 5.1 mmol/L 4.5  4.4  4.3   Chloride 98 - 111 mmol/L 103  101  105   CO2 22 - 32 mmol/L 24  26  24    Calcium 8.9 - 10.3 mg/dL 95.2  84.1  32.4         Edman Circle, MD 09/02/2022, 9:37 AM  Cc: Paulina Fusi, MD

## 2022-09-03 DIAGNOSIS — M48061 Spinal stenosis, lumbar region without neurogenic claudication: Secondary | ICD-10-CM | POA: Diagnosis not present

## 2022-09-03 DIAGNOSIS — E1122 Type 2 diabetes mellitus with diabetic chronic kidney disease: Secondary | ICD-10-CM | POA: Diagnosis not present

## 2022-09-03 DIAGNOSIS — S32010D Wedge compression fracture of first lumbar vertebra, subsequent encounter for fracture with routine healing: Secondary | ICD-10-CM | POA: Diagnosis not present

## 2022-09-03 DIAGNOSIS — E1151 Type 2 diabetes mellitus with diabetic peripheral angiopathy without gangrene: Secondary | ICD-10-CM | POA: Diagnosis not present

## 2022-09-03 DIAGNOSIS — I129 Hypertensive chronic kidney disease with stage 1 through stage 4 chronic kidney disease, or unspecified chronic kidney disease: Secondary | ICD-10-CM | POA: Diagnosis not present

## 2022-09-03 DIAGNOSIS — E1140 Type 2 diabetes mellitus with diabetic neuropathy, unspecified: Secondary | ICD-10-CM | POA: Diagnosis not present

## 2022-09-03 DIAGNOSIS — N183 Chronic kidney disease, stage 3 unspecified: Secondary | ICD-10-CM | POA: Diagnosis not present

## 2022-09-03 DIAGNOSIS — I2510 Atherosclerotic heart disease of native coronary artery without angina pectoris: Secondary | ICD-10-CM | POA: Diagnosis not present

## 2022-09-03 DIAGNOSIS — J449 Chronic obstructive pulmonary disease, unspecified: Secondary | ICD-10-CM | POA: Diagnosis not present

## 2022-09-08 DIAGNOSIS — I2510 Atherosclerotic heart disease of native coronary artery without angina pectoris: Secondary | ICD-10-CM | POA: Diagnosis not present

## 2022-09-08 DIAGNOSIS — J449 Chronic obstructive pulmonary disease, unspecified: Secondary | ICD-10-CM | POA: Diagnosis not present

## 2022-09-08 DIAGNOSIS — E1140 Type 2 diabetes mellitus with diabetic neuropathy, unspecified: Secondary | ICD-10-CM | POA: Diagnosis not present

## 2022-09-08 DIAGNOSIS — I129 Hypertensive chronic kidney disease with stage 1 through stage 4 chronic kidney disease, or unspecified chronic kidney disease: Secondary | ICD-10-CM | POA: Diagnosis not present

## 2022-09-08 DIAGNOSIS — E1151 Type 2 diabetes mellitus with diabetic peripheral angiopathy without gangrene: Secondary | ICD-10-CM | POA: Diagnosis not present

## 2022-09-08 DIAGNOSIS — S32010D Wedge compression fracture of first lumbar vertebra, subsequent encounter for fracture with routine healing: Secondary | ICD-10-CM | POA: Diagnosis not present

## 2022-09-08 DIAGNOSIS — M48061 Spinal stenosis, lumbar region without neurogenic claudication: Secondary | ICD-10-CM | POA: Diagnosis not present

## 2022-09-08 DIAGNOSIS — N183 Chronic kidney disease, stage 3 unspecified: Secondary | ICD-10-CM | POA: Diagnosis not present

## 2022-09-08 DIAGNOSIS — E1122 Type 2 diabetes mellitus with diabetic chronic kidney disease: Secondary | ICD-10-CM | POA: Diagnosis not present

## 2022-09-09 DIAGNOSIS — S32010G Wedge compression fracture of first lumbar vertebra, subsequent encounter for fracture with delayed healing: Secondary | ICD-10-CM | POA: Diagnosis not present

## 2022-09-10 DIAGNOSIS — S32010D Wedge compression fracture of first lumbar vertebra, subsequent encounter for fracture with routine healing: Secondary | ICD-10-CM | POA: Diagnosis not present

## 2022-09-10 DIAGNOSIS — J449 Chronic obstructive pulmonary disease, unspecified: Secondary | ICD-10-CM | POA: Diagnosis not present

## 2022-09-10 DIAGNOSIS — E1140 Type 2 diabetes mellitus with diabetic neuropathy, unspecified: Secondary | ICD-10-CM | POA: Diagnosis not present

## 2022-09-10 DIAGNOSIS — N183 Chronic kidney disease, stage 3 unspecified: Secondary | ICD-10-CM | POA: Diagnosis not present

## 2022-09-10 DIAGNOSIS — E1151 Type 2 diabetes mellitus with diabetic peripheral angiopathy without gangrene: Secondary | ICD-10-CM | POA: Diagnosis not present

## 2022-09-10 DIAGNOSIS — M48061 Spinal stenosis, lumbar region without neurogenic claudication: Secondary | ICD-10-CM | POA: Diagnosis not present

## 2022-09-10 DIAGNOSIS — I2510 Atherosclerotic heart disease of native coronary artery without angina pectoris: Secondary | ICD-10-CM | POA: Diagnosis not present

## 2022-09-10 DIAGNOSIS — I129 Hypertensive chronic kidney disease with stage 1 through stage 4 chronic kidney disease, or unspecified chronic kidney disease: Secondary | ICD-10-CM | POA: Diagnosis not present

## 2022-09-10 DIAGNOSIS — E1122 Type 2 diabetes mellitus with diabetic chronic kidney disease: Secondary | ICD-10-CM | POA: Diagnosis not present

## 2022-09-14 DIAGNOSIS — M2408 Loose body, other site: Secondary | ICD-10-CM | POA: Diagnosis not present

## 2022-09-14 DIAGNOSIS — M8588 Other specified disorders of bone density and structure, other site: Secondary | ICD-10-CM | POA: Diagnosis not present

## 2022-09-14 DIAGNOSIS — R918 Other nonspecific abnormal finding of lung field: Secondary | ICD-10-CM | POA: Diagnosis not present

## 2022-09-14 DIAGNOSIS — N289 Disorder of kidney and ureter, unspecified: Secondary | ICD-10-CM | POA: Diagnosis not present

## 2022-09-14 DIAGNOSIS — N2889 Other specified disorders of kidney and ureter: Secondary | ICD-10-CM | POA: Diagnosis not present

## 2022-09-14 DIAGNOSIS — N398 Other specified disorders of urinary system: Secondary | ICD-10-CM | POA: Diagnosis not present

## 2022-09-14 DIAGNOSIS — Z87311 Personal history of (healed) other pathological fracture: Secondary | ICD-10-CM | POA: Diagnosis not present

## 2022-09-16 DIAGNOSIS — R339 Retention of urine, unspecified: Secondary | ICD-10-CM | POA: Diagnosis not present

## 2022-09-16 DIAGNOSIS — R32 Unspecified urinary incontinence: Secondary | ICD-10-CM | POA: Diagnosis not present

## 2022-09-16 DIAGNOSIS — N390 Urinary tract infection, site not specified: Secondary | ICD-10-CM | POA: Diagnosis not present

## 2022-09-17 DIAGNOSIS — E1122 Type 2 diabetes mellitus with diabetic chronic kidney disease: Secondary | ICD-10-CM | POA: Diagnosis not present

## 2022-09-17 DIAGNOSIS — I2510 Atherosclerotic heart disease of native coronary artery without angina pectoris: Secondary | ICD-10-CM | POA: Diagnosis not present

## 2022-09-17 DIAGNOSIS — M48061 Spinal stenosis, lumbar region without neurogenic claudication: Secondary | ICD-10-CM | POA: Diagnosis not present

## 2022-09-17 DIAGNOSIS — N183 Chronic kidney disease, stage 3 unspecified: Secondary | ICD-10-CM | POA: Diagnosis not present

## 2022-09-17 DIAGNOSIS — I129 Hypertensive chronic kidney disease with stage 1 through stage 4 chronic kidney disease, or unspecified chronic kidney disease: Secondary | ICD-10-CM | POA: Diagnosis not present

## 2022-09-17 DIAGNOSIS — J449 Chronic obstructive pulmonary disease, unspecified: Secondary | ICD-10-CM | POA: Diagnosis not present

## 2022-09-17 DIAGNOSIS — E1151 Type 2 diabetes mellitus with diabetic peripheral angiopathy without gangrene: Secondary | ICD-10-CM | POA: Diagnosis not present

## 2022-09-17 DIAGNOSIS — E1140 Type 2 diabetes mellitus with diabetic neuropathy, unspecified: Secondary | ICD-10-CM | POA: Diagnosis not present

## 2022-09-17 DIAGNOSIS — S32010D Wedge compression fracture of first lumbar vertebra, subsequent encounter for fracture with routine healing: Secondary | ICD-10-CM | POA: Diagnosis not present

## 2022-09-21 ENCOUNTER — Ambulatory Visit: Payer: Self-pay

## 2022-09-21 NOTE — Patient Outreach (Signed)
  Care Coordination   Follow Up Visit Note   09/21/2022 Name: Lisa Maxwell MRN: [id number] DOB: 1945/09/11  Lisa Maxwell is a 77 y.o. year old female who sees Paulina Fusi, MD for primary care. I spoke with  [name] by phone today.  What matters to the patients health and wellness today?  Patient reports that she is doing pretty well. Continues to back pain.  Continues to wear back brace.  Reports pain level 5/10.  Has finished PT.  Continues to do home exercises at home. No falls.  Continues to use walker and wheelchair.   Had an appointment at [facility] about kidney mass. Patient reports mass is stable and she will follow up in 6 months.  Denies any other concerns.   Goals met:  Patient and husband report that they are self managing well without any difficulty right now.  Will close case and reopen if patient has needs in the future.  Reviewed with husband to call [employer] to determine if patient is eligible for additional outpatient PT. If so then husband to call MD office for a referral and order.  Husband concerned about bill from [facility]. Encouraged patient and or husband to call the business office to inquire about  bill.    SDOH assessments and interventions completed:  No     Care Coordination Interventions:  Yes, provided   Follow up plan: No further intervention required.   Encounter Outcome:  Pt. Visit Completed   [name], RN, BSN, CEN [facility] Coordinator [phone]

## 2022-10-06 ENCOUNTER — Other Ambulatory Visit: Payer: Self-pay | Admitting: Pulmonary Disease

## 2022-10-06 ENCOUNTER — Telehealth: Payer: Self-pay | Admitting: *Deleted

## 2022-10-06 ENCOUNTER — Other Ambulatory Visit: Payer: Self-pay | Admitting: Cardiology

## 2022-10-06 DIAGNOSIS — J4531 Mild persistent asthma with (acute) exacerbation: Secondary | ICD-10-CM

## 2022-10-06 NOTE — Progress Notes (Unsigned)
  Care Coordination Note  10/06/2022 Name: Lisa Maxwell MRN: [id number] DOB: 08/10/1945  Lisa Maxwell is a 77 y.o. year old female who is a primary care patient of Paulina Fusi, MD and is actively engaged with the care management team. I reached out to [name] by phone today to assist with re-scheduling a follow up visit with the RN Case Manager  Follow up plan: Unsuccessful telephone outreach attempt made. A HIPAA compliant phone message was left for the patient providing contact information and requesting a return call.   Burman Nieves, CCMA Care Coordination Care Guide Direct Dial: [phone]

## 2022-10-07 NOTE — Progress Notes (Signed)
  Care Coordination Note  10/07/2022 Name: [name] MRN: [id number] DOB: [birth year]  Lisa Maxwell is a 77 y.o. year old female who is a primary care patient of Paulina Fusi, MD and is actively engaged with the care management team. I reached out to [name] by phone today to assist with re-scheduling a follow up visit with the RN Case Manager  Follow up plan: Telephone appointment with care management team member scheduled for: 10/14/2022  Burman Nieves, [facility] Care Coordination Care Guide Direct Dial: [phone]

## 2022-10-14 ENCOUNTER — Ambulatory Visit: Payer: Self-pay

## 2022-10-14 NOTE — Patient Outreach (Signed)
  Care Coordination   Follow Up Visit Note   10/14/2022 Name: RYIAN LYNDE MRN: [id number] DOB: [birth year]  TIMMI DEVORA is a 77 y.o. year old female who sees Paulina Fusi, MD for primary care. I spoke with  [name] by phone today.  What matters to the patients health and wellness today?  Patient reports that she continues to have back pain.  Reports she continues to take her muscle relaxer.   Reports that her bladder is not emptying well and now has a foley catheter.   Continues to wear her back brace.   Denies any other concerns.  Case closed.    SDOH assessments and interventions completed:  No     Care Coordination Interventions:  Yes, provided   Interventions Today    Flowsheet Row Most Recent Value  Chronic Disease   Chronic disease during today's visit Other  [back pain.]  Education Interventions   Education Provided Provided Education  [reviewed with patient when to call MD.]        Follow up plan: No further intervention required.   Encounter Outcome:  Pt. Visit Completed   [name], RN, BSN, CEN [facility] Coordinator [phone]

## 2022-10-15 DIAGNOSIS — H40013 Open angle with borderline findings, low risk, bilateral: Secondary | ICD-10-CM | POA: Diagnosis not present

## 2022-10-15 DIAGNOSIS — Q141 Congenital malformation of retina: Secondary | ICD-10-CM | POA: Diagnosis not present

## 2022-10-15 DIAGNOSIS — H04123 Dry eye syndrome of bilateral lacrimal glands: Secondary | ICD-10-CM | POA: Diagnosis not present

## 2022-10-15 DIAGNOSIS — H18513 Endothelial corneal dystrophy, bilateral: Secondary | ICD-10-CM | POA: Diagnosis not present

## 2022-10-15 DIAGNOSIS — Z961 Presence of intraocular lens: Secondary | ICD-10-CM | POA: Diagnosis not present

## 2022-10-15 DIAGNOSIS — H1045 Other chronic allergic conjunctivitis: Secondary | ICD-10-CM | POA: Diagnosis not present

## 2022-10-15 DIAGNOSIS — E119 Type 2 diabetes mellitus without complications: Secondary | ICD-10-CM | POA: Diagnosis not present

## 2022-10-15 DIAGNOSIS — H43813 Vitreous degeneration, bilateral: Secondary | ICD-10-CM | POA: Diagnosis not present

## 2022-10-19 DIAGNOSIS — R32 Unspecified urinary incontinence: Secondary | ICD-10-CM | POA: Diagnosis not present

## 2022-10-19 DIAGNOSIS — R339 Retention of urine, unspecified: Secondary | ICD-10-CM | POA: Diagnosis not present

## 2022-10-19 DIAGNOSIS — N390 Urinary tract infection, site not specified: Secondary | ICD-10-CM | POA: Diagnosis not present

## 2022-10-23 DIAGNOSIS — N390 Urinary tract infection, site not specified: Secondary | ICD-10-CM | POA: Diagnosis not present

## 2022-10-23 DIAGNOSIS — G8929 Other chronic pain: Secondary | ICD-10-CM | POA: Diagnosis not present

## 2022-10-23 DIAGNOSIS — S32010A Wedge compression fracture of first lumbar vertebra, initial encounter for closed fracture: Secondary | ICD-10-CM | POA: Diagnosis not present

## 2022-10-23 DIAGNOSIS — Z6838 Body mass index (BMI) 38.0-38.9, adult: Secondary | ICD-10-CM | POA: Diagnosis not present

## 2022-10-24 DIAGNOSIS — N390 Urinary tract infection, site not specified: Secondary | ICD-10-CM | POA: Diagnosis not present

## 2022-10-25 DIAGNOSIS — N390 Urinary tract infection, site not specified: Secondary | ICD-10-CM | POA: Diagnosis not present

## 2022-10-26 DIAGNOSIS — N390 Urinary tract infection, site not specified: Secondary | ICD-10-CM | POA: Diagnosis not present

## 2022-10-27 DIAGNOSIS — R310 Gross hematuria: Secondary | ICD-10-CM | POA: Diagnosis not present

## 2022-10-28 DIAGNOSIS — N390 Urinary tract infection, site not specified: Secondary | ICD-10-CM | POA: Diagnosis not present

## 2022-10-29 DIAGNOSIS — N390 Urinary tract infection, site not specified: Secondary | ICD-10-CM | POA: Diagnosis not present

## 2022-11-04 ENCOUNTER — Telehealth: Payer: Self-pay

## 2022-11-04 NOTE — Telephone Encounter (Signed)
Transition Care Management Unsuccessful Follow-up Telephone Call  Date of discharge and from where:  [facility] 7/23  Attempts:  1st Attempt  Reason for unsuccessful TCM follow-up call:  No answer/busy   Lenard Forth [facility] Guide, [facility] [phone] [address] [place], [place], [place] [address] Phone: [phone] Email: [email or web address]

## 2022-11-04 NOTE — Telephone Encounter (Signed)
Transition Care Management Unsuccessful Follow-up Telephone Call  Date of discharge and from where:  [facility] 7/23  Attempts:  2nd Attempt  Reason for unsuccessful TCM follow-up call:  No answer/busy   Lenard Forth [facility] Guide, [facility] [phone] [address] [place], [place], [place] [address] Phone: [phone] Email: [email or web address]

## 2022-11-17 ENCOUNTER — Encounter: Payer: Self-pay | Admitting: Infectious Diseases

## 2022-11-17 ENCOUNTER — Other Ambulatory Visit: Payer: Self-pay

## 2022-11-17 ENCOUNTER — Ambulatory Visit (INDEPENDENT_AMBULATORY_CARE_PROVIDER_SITE_OTHER): Payer: Medicare HMO | Admitting: Infectious Diseases

## 2022-11-17 VITALS — BP 123/77 | HR 88 | Resp 16 | Ht <= 58 in | Wt 155.0 lb

## 2022-11-17 DIAGNOSIS — Z79899 Other long term (current) drug therapy: Secondary | ICD-10-CM | POA: Diagnosis not present

## 2022-11-17 DIAGNOSIS — N390 Urinary tract infection, site not specified: Secondary | ICD-10-CM | POA: Insufficient documentation

## 2022-11-17 HISTORY — DX: Urinary tract infection, site not specified: N39.0

## 2022-11-17 NOTE — Progress Notes (Incomplete)
Patient Active Problem List   Diagnosis Date Noted  . Esophageal stricture 06/17/2022  . Anorexia 06/17/2022  . Abnormal barium swallow 06/17/2022  . Esophageal dysphagia 06/17/2022  . L1 vertebral fracture (HCC) 06/09/2022  . Esophageal abnormality 06/09/2022  . History of pulmonary embolus (PE) 05/28/2022  . Tinea unguium 02/03/2022  . History of pulmonary embolism 11/28/2021  . Pulmonary embolism (HCC) 11/21/2021  . Pressure injury of skin 11/21/2021  . Cauda equina compression (HCC) 08/28/2021  . Class 1 obesity due to excess calories with serious comorbidity and body mass index (BMI) of 34.0 to 34.9 in adult 05/20/2021  . Dyshidrotic eczema 04/29/2021  . Comprehensive diabetic foot examination, type 2 DM, encounter for (HCC) 04/29/2021  . Chronic, continuous use of opioids 03/04/2021  . Diabetes (HCC) 11/08/2020  . Hypertension 11/08/2020  . Muscle pain 04/29/2020  . Cancer (HCC)   . Cerebrovascular disease   . Chronic kidney disease   . Dysrhythmia   . Heart murmur   . Osteoarthritis of left knee   . OSA (obstructive sleep apnea)   . S/P insertion of spinal cord stimulator 07/13/2019  . Preop cardiovascular exam 05/01/2019  . Pain management contract agreement 04/13/2019  . Spondylolisthesis 03/20/2019  . Lumbar spondylosis 03/20/2019  . Spondylosis of lumbosacral region without myelopathy or radiculopathy 01/30/2019  . DDD (degenerative disc disease), lumbar 11/17/2018  . Facet joint disease 11/17/2018  . History of knee replacement, total, bilateral 11/17/2018  . Type 2 diabetes mellitus (HCC) 09/28/2018  . Hypertensive disorder 09/28/2018  . Chronic obstructive lung disease (HCC) 09/28/2018  . Increased body mass index 09/28/2018  . Smoker 09/28/2018  . Chronic renal failure 09/28/2018  . Peripheral vascular disease (HCC) 09/28/2018  . Chronic atrial fibrillation (HCC) 09/28/2018  . Chronic bilateral low back pain without sciatica 03/08/2018  . Chronic  pain syndrome 03/08/2018  . Facet arthritis of lumbar region 03/08/2018  . Foraminal stenosis of lumbar region 03/08/2018  . Lumbar radiculopathy 03/08/2018  . Spinal stenosis of lumbar region with neurogenic claudication 03/08/2018  . CAD (coronary artery disease) 11/04/2016  . Essential hypertension 11/04/2016  . Diabetes mellitus due to underlying condition with unspecified complications (HCC) 11/04/2016  . Mixed dyslipidemia 11/04/2016  . Morbid obesity (HCC) 11/04/2016  . PAF (paroxysmal atrial fibrillation) (HCC) 04/30/2015  . Renal insufficiency 04/30/2015  . CKD (chronic kidney disease) stage 3, GFR 30-59 ml/min (HCC)     Patient's Medications  New Prescriptions   No medications on file  Previous Medications   ALBUTEROL (VENTOLIN HFA) 108 (90 BASE) MCG/ACT INHALER    Inhale into the lungs every 6 (six) hours as needed for wheezing or shortness of breath.   APIXABAN (ELIQUIS) 5 MG TABS TABLET    Take 1 tablet (5 mg total) by mouth 2 (two) times daily.   BETHANECHOL (URECHOLINE) 50 MG TABLET    Take 50 mg by mouth 2 (two) times daily.   COENZYME Q10 (CO Q-10) 200 MG CAPS    Take 200 mg by mouth in the morning.   CYANOCOBALAMIN (VITAMIN B-12) 500 MCG SUBL    Place 500 mcg under the tongue in the morning.   DIPHENHYDRAMINE (BENADRYL) 25 MG TABLET    Take 25 mg by mouth in the morning.   DOCUSATE SODIUM (COLACE) 100 MG CAPSULE    Take 100 mg by mouth daily.   FLUTICASONE FUROATE-VILANTEROL (BREO ELLIPTA) 100-25 MCG/ACT AEPB    Inhale 1-2 puffs into the lungs daily.  GABAPENTIN (NEURONTIN) 600 MG TABLET    Take 600 mg by mouth at bedtime.   HYDROCODONE-ACETAMINOPHEN (NORCO) 10-325 MG TABLET    Take 0.5 tablets by mouth 4 (four) times daily.   IRON, FERROUS SULFATE, 325 (65 FE) MG TABS    Take 65 mg of iron by mouth daily with breakfast.   LATANOPROST (XALATAN) 0.005 % OPHTHALMIC SOLUTION    Place 1 drop into both eyes at bedtime.   LOVASTATIN (MEVACOR) 40 MG TABLET    Take 40 mg by  mouth at bedtime.    METHOCARBAMOL (ROBAXIN) 500 MG TABLET    Take 1 tablet (500 mg total) by mouth every 6 (six) hours as needed for muscle spasms.   MULTIPLE VITAMINS-MINERALS (CENTRUM SILVER 50+WOMEN) TABS    Take 1 tablet by mouth daily with breakfast.   NALOXONE (NARCAN) NASAL SPRAY 4 MG/0.1 ML    Place 1 spray into the nose daily as needed (accidental overdose).   NITROGLYCERIN (NITROSTAT) 0.4 MG SL TABLET    Place 0.4 mg under the tongue every 5 (five) minutes as needed for chest pain.   PANTOPRAZOLE (PROTONIX) 40 MG TABLET    Take 1 tablet (40 mg total) by mouth daily.   RYBELSUS 3 MG TABS    Take 3 mg by mouth daily before breakfast.   TAMSULOSIN (FLOMAX) 0.4 MG CAPS CAPSULE    Take 0.4 mg by mouth 2 (two) times daily.   TRIAMCINOLONE OINTMENT (KENALOG) 0.5 %    Apply 1 Application topically See admin instructions. Apply to affected areas of both feet two to three times a week   TRIMETHOPRIM (TRIMPEX) 100 MG TABLET    Take 100 mg by mouth in the morning.  Modified Medications   No medications on file  Discontinued Medications   No medications on file    Subjective: 77 Y O female with multiple co-morbidities as below including DM2, Bowel and bladder incontinence, indwelling Foleys for incomplete baldder emptying who is referred from [facility] NP Sharlot Gowda for recurrent/chronic UTI.   Patient is accompanied by her husband. Reports she had difficulty with complete bladder emptying for which she has a Foleys placed. Foleys gets changed every month.  Denies h/o kidney stones. Follows at [facility] for ca of rt kidney and on surviellance. She recently completed ertapenem for 7 days starting 7/18. Reports having diarrhea when on ertapenem as well as yeast infection. She is wheel chair bound. Complains of chronic back and leg pain  s   Bladder issues got fixed with back surgery but then had L1 collapse which has been managed conservatively. [facility] and was also in  rehab  Review of Systems: ROS  Past Medical History:  Diagnosis Date  . CAD (coronary artery disease) 11/04/2016  . Cancer (HCC)    skin cancer  . Cauda equina compression (HCC) 08/28/2021  . Cerebrovascular disease   . Chronic atrial fibrillation (HCC) 09/28/2018  . Chronic bilateral low back pain without sciatica 03/08/2018  . Chronic kidney disease   . Chronic obstructive lung disease (HCC) 09/28/2018  . Chronic pain syndrome 03/08/2018  . Chronic renal failure 09/28/2018  . Chronic, continuous use of opioids 03/04/2021   Last Assessment & Plan:  Formatting of this note might be different from the original. Patient and I have discussed the hazardous effects of continued opiate pain medication usage. Risks and benefits of above medications including but not limited to possibility of respiratory depression, sedation, and even death  were discussed with the patient who expressed an understanding.  Patient did not displ  . CKD (chronic kidney disease) stage 3, GFR 30-59 ml/min (HCC)   . Class 1 obesity due to excess calories with serious comorbidity and body mass index (BMI) of 34.0 to 34.9 in adult 05/20/2021   Last Assessment & Plan: Formatting of this note might be different from the original. Patient educated about the detrimental effects of weight as it specifically pertains to pain management and overall health.  Patient's BMI 34.08 Encouraged healthy eating habits and routine low-impact cardiovascular exercises as tolerated.  . Comprehensive diabetic foot examination, type 2 DM, encounter for (HCC) 04/29/2021  . DDD (degenerative disc disease), lumbar 11/17/2018   Last Assessment & Plan:  Formatting of this note might be different from the original. See spinal stenosis plan  . Diabetes (HCC)   . Diabetes mellitus due to underlying condition with unspecified complications (HCC) 11/04/2016  . Dyshidrotic eczema 04/29/2021  . Dysrhythmia    afib  . Essential hypertension 11/04/2016   . Facet arthritis of lumbar region 03/08/2018  . Facet joint disease 11/17/2018   Last Assessment & Plan:  Formatting of this note might be different from the original. See spinal stenosis plan  . Foraminal stenosis of lumbar region 03/08/2018  . Heart murmur   . History of knee replacement, total, bilateral 11/17/2018   Last Assessment & Plan:  Formatting of this note might be different from the original. Have recommended to her several times if any further concerns in regards to the pain status post arthroplasty, she is to follow up with her surgeon.  [name] History of pulmonary embolism 11/28/2021  . Hypertension   . Hypertensive disorder 09/28/2018  . Increased body mass index 09/28/2018  . Lumbar radiculopathy 03/08/2018  . Lumbar spondylosis 03/20/2019  . Mixed dyslipidemia 11/04/2016  . Morbid obesity (HCC) 11/04/2016  . Muscle pain 04/29/2020  . Osteoarthritis of left knee   . PAF (paroxysmal atrial fibrillation) (HCC) 04/30/2015  . Pain management contract agreement 04/13/2019   Last Assessment & Plan:  Formatting of this note might be different from the original. Contract updated today.  UDS completed.  [place] controlled substance registry reviewed and is consistent with her regimen.  . Peripheral vascular disease (HCC) 09/28/2018  . Preop cardiovascular exam 05/01/2019  . Pressure injury of skin 11/21/2021  . Pulmonary emboli (HCC)   . Pulmonary embolism (HCC) 11/21/2021  . Renal insufficiency 04/30/2015  . S/P insertion of spinal cord stimulator 07/13/2019   Last Assessment & Plan:  Formatting of this note might be different from the original. About 5 months status post insertion of spinal cord stimulator with excellent relief.  She continues to keep close follow-up with her representative and has been working to appropriately find best programs to get her adequate pain relief.  . Sleep apnea   . Smoker 09/28/2018  . Spinal stenosis of lumbar region 03/20/2019  .  Spinal stenosis of lumbar region with neurogenic claudication 03/08/2018   Last Assessment & Plan:  Formatting of this note is different from the original. 77 year old female with chronic low back pain and bilateral, right greater than left, L4-5 radicular pain, and claudication. She has substantial amount of multifactorial degenerative thoracic and lumbar spine pathology.  After being deemed an inappropriate open surgical candidate, and failing to respond to multiple in  . Spondylolisthesis 03/20/2019  . Spondylosis of lumbosacral region without myelopathy or radiculopathy 01/30/2019   Last Assessment & Plan:  Formatting  of this note might be different from the original. See spinal stenosis plan  . Tinea unguium 02/03/2022  . Type 2 diabetes mellitus (HCC) 09/28/2018   Past Surgical History:  Procedure Laterality Date  . ABDOMINAL HYSTERECTOMY    . BACK SURGERY    . BIOPSY  06/18/2022   Procedure: BIOPSY;  Surgeon: Meridee Score Netty Starring., MD;  Location[facility] ENDOSCOPY;  Service: Gastroenterology;;  . ESOPHAGOGASTRODUODENOSCOPY (EGD) WITH PROPOFOL N/A 06/18/2022   Procedure: ESOPHAGOGASTRODUODENOSCOPY (EGD) WITH PROPOFOL;  Surgeon: Lemar Lofty., MD;  Location: [facility] ENDOSCOPY;  Service: Gastroenterology;  Laterality: N/A;  . EYE SURGERY     bilateral cataracts  . IR ANGIOGRAM PULMONARY BILATERAL SELECTIVE  11/21/2021  . IR ANGIOGRAM SELECTIVE EACH ADDITIONAL VESSEL  11/21/2021  . IR ANGIOGRAM SELECTIVE EACH ADDITIONAL VESSEL  11/21/2021  . IR THROMBECT PRIM MECH INIT (INCLU) MOD SED  11/21/2021  . IR US GUIDE VASC ACCESS RIGHT  11/21/2021  . JOINT REPLACEMENT Bilateral    knees  . RADIOLOGY WITH ANESTHESIA N/A 11/21/2021   Procedure: IR WITH ANESTHESIA;  Surgeon: Radiologist, Medication, MD;  Location: [facility] OR;  Service: Radiology;  Laterality: N/A;  . SAVORY DILATION N/A 06/18/2022   Procedure: SAVORY DILATION;  Surgeon: Meridee Score Netty Starring., MD;  Location: [facility] ENDOSCOPY;  Service:  Gastroenterology;  Laterality: N/A;  . SPINAL CORD STIMULATOR INSERTION N/A 07/07/2019   Procedure: LUMBAR SPINAL CORD STIMULATOR INSERTION;  Surgeon: Odette Fraction, MD;  Location[facility] OR;  Service: Neurosurgery;  Laterality: N/A;  . total left knee      Social History   Tobacco Use  . Smoking status: Every Day    Current packs/day: 0.00    Average packs/day: 0.5 packs/day for 62.0 years (31.0 ttl pk-yrs)    Types: Cigarettes    Start date: 05/07/1957    Last attempt to quit: 05/08/2019    Years since quitting: 3.5  . Smokeless tobacco: Never  Vaping Use  . Vaping status: Never Used  Substance Use Topics  . Alcohol use: No  . Drug use: No    Family History  Problem Relation Age of Onset  . Diabetes Mother     No Known Allergies  Health Maintenance  Topic Date Due  . COVID-19 Vaccine (1) Never done  . Pneumonia Vaccine 90+ Years old (1 of 2 - PCV) Never done  . FOOT EXAM  Never done  . OPHTHALMOLOGY EXAM  Never done  . Diabetic kidney evaluation - Urine ACR  Never done  . Hepatitis C Screening  Never done  . DTaP/Tdap/Td (1 - Tdap) Never done  . Zoster Vaccines- Shingrix (1 of 2) Never done  . DEXA SCAN  Never done  . Medicare Annual Wellness (AWV)  07/31/2021  . INFLUENZA VACCINE  11/05/2022  . Lung Cancer Screening  11/22/2022  . HEMOGLOBIN A1C  12/11/2022  . Diabetic kidney evaluation - eGFR measurement  06/19/2023  . HPV VACCINES  Aged Out  . Colonoscopy  Discontinued    Objective:  There were no vitals filed for this visit. There is no height or weight on file to calculate BMI.  Physical Exam Constitutional:      Appearance: Normal appearance.  HENT:     Head: Normocephalic and atraumatic.      Mouth: Mucous membranes are moist.  Eyes:    Conjunctiva/sclera: Conjunctivae normal.     Pupils: Pupils are equal, round, and reactive to light.   Cardiovascular:     Rate and Rhythm: Normal rate and regular  rhythm.     Heart sounds: No murmur heard. No  friction rub. No gallop.   Pulmonary:     Effort: Pulmonary effort is normal.     Breath sounds: Normal breath sounds.   Abdominal:     General: Non distended     Palpations: soft.   Musculoskeletal:        General: Normal range of motion.   Skin:    General: Skin is warm and dry.     Comments:  Neurological:     General: grossly non focal     Mental Status: awake, alert and oriented to person, place, and time.   Psychiatric:        Mood and Affect: Mood normal.   Lab Results Lab Results  Component Value Date   WBC 7.9 06/19/2022   HGB 14.9 06/19/2022   HCT 47.0 (H) 06/19/2022   MCV 94.4 06/19/2022   PLT 215 06/19/2022    Lab Results  Component Value Date   CREATININE 1.39 (H) 06/19/2022   BUN 32 (H) 06/19/2022   NA 136 06/19/2022   K 4.5 06/19/2022   CL 103 06/19/2022   CO2 24 06/19/2022    Lab Results  Component Value Date   ALT 24 06/10/2022   AST 34 06/10/2022   ALKPHOS 60 06/10/2022   BILITOT 0.6 06/10/2022    No results found for: "CHOL", "HDL", "LDLCALC", "LDLDIRECT", "TRIG", "CHOLHDL" No results found for: "LABRPR", "RPRTITER" No results found for: "HIV1RNAQUANT", "HIV1RNAVL", "CD4TABS"   Problem List Items Addressed This Visit   None   I have personally spent at least 60 minutes involved in face-to-face and non-face-to-face activities for this patient on the day of the visit. Professional time spent includes the following activities: Preparing to see the patient (review of tests), Obtaining and/or reviewing separately obtained history (admission/discharge record), Performing a medically appropriate examination and/or evaluation , Ordering medications/tests/procedures, referring and communicating with other health care professionals, Documenting clinical information in the EMR, Independently interpreting results (not separately reported), Communicating results to the patient/family/caregiver, Counseling and educating the patient/family/caregiver and  Care coordination (not separately reported).   Victoriano Lain, MD [facility] 11/17/2022, 8:08 AM

## 2022-11-19 DIAGNOSIS — R32 Unspecified urinary incontinence: Secondary | ICD-10-CM | POA: Diagnosis not present

## 2022-11-19 DIAGNOSIS — N390 Urinary tract infection, site not specified: Secondary | ICD-10-CM | POA: Diagnosis not present

## 2022-11-19 DIAGNOSIS — R339 Retention of urine, unspecified: Secondary | ICD-10-CM | POA: Diagnosis not present

## 2022-11-23 DIAGNOSIS — N183 Chronic kidney disease, stage 3 unspecified: Secondary | ICD-10-CM | POA: Diagnosis not present

## 2022-11-23 DIAGNOSIS — Z0000 Encounter for general adult medical examination without abnormal findings: Secondary | ICD-10-CM | POA: Diagnosis not present

## 2022-11-23 DIAGNOSIS — M8589 Other specified disorders of bone density and structure, multiple sites: Secondary | ICD-10-CM | POA: Diagnosis not present

## 2022-11-23 DIAGNOSIS — Z9181 History of falling: Secondary | ICD-10-CM | POA: Diagnosis not present

## 2022-11-23 DIAGNOSIS — E1122 Type 2 diabetes mellitus with diabetic chronic kidney disease: Secondary | ICD-10-CM | POA: Diagnosis not present

## 2022-11-23 DIAGNOSIS — I1310 Hypertensive heart and chronic kidney disease without heart failure, with stage 1 through stage 4 chronic kidney disease, or unspecified chronic kidney disease: Secondary | ICD-10-CM | POA: Diagnosis not present

## 2022-11-23 DIAGNOSIS — M5442 Lumbago with sciatica, left side: Secondary | ICD-10-CM | POA: Diagnosis not present

## 2022-11-23 DIAGNOSIS — I480 Paroxysmal atrial fibrillation: Secondary | ICD-10-CM | POA: Diagnosis not present

## 2022-11-24 DIAGNOSIS — N390 Urinary tract infection, site not specified: Secondary | ICD-10-CM | POA: Diagnosis not present

## 2022-11-25 DIAGNOSIS — N390 Urinary tract infection, site not specified: Secondary | ICD-10-CM | POA: Diagnosis not present

## 2022-11-26 DIAGNOSIS — Z79899 Other long term (current) drug therapy: Secondary | ICD-10-CM

## 2022-11-26 DIAGNOSIS — N390 Urinary tract infection, site not specified: Secondary | ICD-10-CM | POA: Insufficient documentation

## 2022-11-26 HISTORY — DX: Other long term (current) drug therapy: Z79.899

## 2022-11-26 HISTORY — DX: Urinary tract infection, site not specified: N39.0

## 2022-11-27 DIAGNOSIS — N390 Urinary tract infection, site not specified: Secondary | ICD-10-CM | POA: Diagnosis not present

## 2022-11-30 ENCOUNTER — Encounter (HOSPITAL_COMMUNITY): Payer: Self-pay

## 2022-11-30 ENCOUNTER — Inpatient Hospital Stay (HOSPITAL_COMMUNITY)
Admission: EM | Admit: 2022-11-30 | Discharge: 2022-12-07 | DRG: 291 | Disposition: A | Discharge status: Skilled Nursing Facility | Payer: Medicare HMO | Attending: Family Medicine | Admitting: Family Medicine

## 2022-11-30 ENCOUNTER — Other Ambulatory Visit: Payer: Self-pay

## 2022-11-30 ENCOUNTER — Emergency Department (HOSPITAL_COMMUNITY): Payer: Medicare HMO

## 2022-11-30 DIAGNOSIS — F17210 Nicotine dependence, cigarettes, uncomplicated: Secondary | ICD-10-CM | POA: Diagnosis present

## 2022-11-30 DIAGNOSIS — E1151 Type 2 diabetes mellitus with diabetic peripheral angiopathy without gangrene: Secondary | ICD-10-CM | POA: Diagnosis present

## 2022-11-30 DIAGNOSIS — G8929 Other chronic pain: Principal | ICD-10-CM

## 2022-11-30 DIAGNOSIS — L301 Dyshidrosis [pompholyx]: Secondary | ICD-10-CM | POA: Diagnosis present

## 2022-11-30 DIAGNOSIS — E782 Mixed hyperlipidemia: Secondary | ICD-10-CM | POA: Diagnosis present

## 2022-11-30 DIAGNOSIS — M549 Dorsalgia, unspecified: Secondary | ICD-10-CM | POA: Diagnosis not present

## 2022-11-30 DIAGNOSIS — R399 Unspecified symptoms and signs involving the genitourinary system: Secondary | ICD-10-CM | POA: Diagnosis not present

## 2022-11-30 DIAGNOSIS — M5442 Lumbago with sciatica, left side: Secondary | ICD-10-CM | POA: Diagnosis not present

## 2022-11-30 DIAGNOSIS — J449 Chronic obstructive pulmonary disease, unspecified: Secondary | ICD-10-CM | POA: Diagnosis present

## 2022-11-30 DIAGNOSIS — E119 Type 2 diabetes mellitus without complications: Secondary | ICD-10-CM | POA: Diagnosis not present

## 2022-11-30 DIAGNOSIS — I472 Ventricular tachycardia, unspecified: Secondary | ICD-10-CM | POA: Diagnosis not present

## 2022-11-30 DIAGNOSIS — Z86711 Personal history of pulmonary embolism: Secondary | ICD-10-CM

## 2022-11-30 DIAGNOSIS — M4726 Other spondylosis with radiculopathy, lumbar region: Secondary | ICD-10-CM | POA: Diagnosis not present

## 2022-11-30 DIAGNOSIS — M5116 Intervertebral disc disorders with radiculopathy, lumbar region: Secondary | ICD-10-CM | POA: Diagnosis present

## 2022-11-30 DIAGNOSIS — I5030 Unspecified diastolic (congestive) heart failure: Secondary | ICD-10-CM | POA: Diagnosis not present

## 2022-11-30 DIAGNOSIS — Z87440 Personal history of urinary (tract) infections: Secondary | ICD-10-CM

## 2022-11-30 DIAGNOSIS — Z9682 Presence of neurostimulator: Secondary | ICD-10-CM

## 2022-11-30 DIAGNOSIS — Z85828 Personal history of other malignant neoplasm of skin: Secondary | ICD-10-CM

## 2022-11-30 DIAGNOSIS — I2510 Atherosclerotic heart disease of native coronary artery without angina pectoris: Secondary | ICD-10-CM | POA: Diagnosis present

## 2022-11-30 DIAGNOSIS — Z7951 Long term (current) use of inhaled steroids: Secondary | ICD-10-CM

## 2022-11-30 DIAGNOSIS — Z7984 Long term (current) use of oral hypoglycemic drugs: Secondary | ICD-10-CM

## 2022-11-30 DIAGNOSIS — M48062 Spinal stenosis, lumbar region with neurogenic claudication: Secondary | ICD-10-CM | POA: Diagnosis present

## 2022-11-30 DIAGNOSIS — M545 Low back pain, unspecified: Secondary | ICD-10-CM | POA: Diagnosis present

## 2022-11-30 DIAGNOSIS — Z6834 Body mass index (BMI) 34.0-34.9, adult: Secondary | ICD-10-CM

## 2022-11-30 DIAGNOSIS — M21372 Foot drop, left foot: Secondary | ICD-10-CM | POA: Diagnosis present

## 2022-11-30 DIAGNOSIS — I480 Paroxysmal atrial fibrillation: Secondary | ICD-10-CM | POA: Diagnosis present

## 2022-11-30 DIAGNOSIS — E669 Obesity, unspecified: Secondary | ICD-10-CM | POA: Diagnosis present

## 2022-11-30 DIAGNOSIS — G4730 Sleep apnea, unspecified: Secondary | ICD-10-CM | POA: Diagnosis present

## 2022-11-30 DIAGNOSIS — M4316 Spondylolisthesis, lumbar region: Secondary | ICD-10-CM | POA: Diagnosis not present

## 2022-11-30 DIAGNOSIS — J90 Pleural effusion, not elsewhere classified: Secondary | ICD-10-CM | POA: Diagnosis not present

## 2022-11-30 DIAGNOSIS — M48061 Spinal stenosis, lumbar region without neurogenic claudication: Secondary | ICD-10-CM | POA: Diagnosis not present

## 2022-11-30 DIAGNOSIS — M47816 Spondylosis without myelopathy or radiculopathy, lumbar region: Secondary | ICD-10-CM | POA: Diagnosis not present

## 2022-11-30 DIAGNOSIS — R531 Weakness: Secondary | ICD-10-CM | POA: Diagnosis not present

## 2022-11-30 DIAGNOSIS — I482 Chronic atrial fibrillation, unspecified: Secondary | ICD-10-CM | POA: Diagnosis present

## 2022-11-30 DIAGNOSIS — Z981 Arthrodesis status: Secondary | ICD-10-CM

## 2022-11-30 DIAGNOSIS — Z7401 Bed confinement status: Secondary | ICD-10-CM | POA: Diagnosis not present

## 2022-11-30 DIAGNOSIS — G894 Chronic pain syndrome: Secondary | ICD-10-CM | POA: Diagnosis present

## 2022-11-30 DIAGNOSIS — I5031 Acute diastolic (congestive) heart failure: Secondary | ICD-10-CM | POA: Diagnosis not present

## 2022-11-30 DIAGNOSIS — J9601 Acute respiratory failure with hypoxia: Secondary | ICD-10-CM | POA: Diagnosis not present

## 2022-11-30 DIAGNOSIS — R059 Cough, unspecified: Secondary | ICD-10-CM | POA: Diagnosis not present

## 2022-11-30 DIAGNOSIS — Z741 Need for assistance with personal care: Secondary | ICD-10-CM | POA: Diagnosis not present

## 2022-11-30 DIAGNOSIS — N1832 Chronic kidney disease, stage 3b: Secondary | ICD-10-CM | POA: Diagnosis present

## 2022-11-30 DIAGNOSIS — Z6836 Body mass index (BMI) 36.0-36.9, adult: Secondary | ICD-10-CM

## 2022-11-30 DIAGNOSIS — E1142 Type 2 diabetes mellitus with diabetic polyneuropathy: Secondary | ICD-10-CM | POA: Diagnosis present

## 2022-11-30 DIAGNOSIS — M6281 Muscle weakness (generalized): Secondary | ICD-10-CM | POA: Diagnosis not present

## 2022-11-30 DIAGNOSIS — S32019A Unspecified fracture of first lumbar vertebra, initial encounter for closed fracture: Secondary | ICD-10-CM | POA: Diagnosis not present

## 2022-11-30 DIAGNOSIS — E1122 Type 2 diabetes mellitus with diabetic chronic kidney disease: Secondary | ICD-10-CM | POA: Diagnosis present

## 2022-11-30 DIAGNOSIS — D509 Iron deficiency anemia, unspecified: Secondary | ICD-10-CM | POA: Diagnosis not present

## 2022-11-30 DIAGNOSIS — I5032 Chronic diastolic (congestive) heart failure: Secondary | ICD-10-CM | POA: Diagnosis present

## 2022-11-30 DIAGNOSIS — Z79899 Other long term (current) drug therapy: Secondary | ICD-10-CM

## 2022-11-30 DIAGNOSIS — Z7901 Long term (current) use of anticoagulants: Secondary | ICD-10-CM

## 2022-11-30 DIAGNOSIS — M4186 Other forms of scoliosis, lumbar region: Secondary | ICD-10-CM | POA: Diagnosis not present

## 2022-11-30 DIAGNOSIS — R0902 Hypoxemia: Secondary | ICD-10-CM | POA: Diagnosis not present

## 2022-11-30 DIAGNOSIS — Z96653 Presence of artificial knee joint, bilateral: Secondary | ICD-10-CM | POA: Diagnosis present

## 2022-11-30 DIAGNOSIS — M160 Bilateral primary osteoarthritis of hip: Secondary | ICD-10-CM | POA: Diagnosis not present

## 2022-11-30 DIAGNOSIS — Z90710 Acquired absence of both cervix and uterus: Secondary | ICD-10-CM

## 2022-11-30 DIAGNOSIS — Z833 Family history of diabetes mellitus: Secondary | ICD-10-CM

## 2022-11-30 DIAGNOSIS — R918 Other nonspecific abnormal finding of lung field: Secondary | ICD-10-CM | POA: Diagnosis not present

## 2022-11-30 DIAGNOSIS — I77810 Thoracic aortic ectasia: Secondary | ICD-10-CM | POA: Diagnosis not present

## 2022-11-30 DIAGNOSIS — L304 Erythema intertrigo: Secondary | ICD-10-CM | POA: Diagnosis present

## 2022-11-30 DIAGNOSIS — M25552 Pain in left hip: Secondary | ICD-10-CM | POA: Diagnosis not present

## 2022-11-30 DIAGNOSIS — Z1152 Encounter for screening for COVID-19: Secondary | ICD-10-CM

## 2022-11-30 DIAGNOSIS — E785 Hyperlipidemia, unspecified: Secondary | ICD-10-CM | POA: Diagnosis present

## 2022-11-30 DIAGNOSIS — I130 Hypertensive heart and chronic kidney disease with heart failure and stage 1 through stage 4 chronic kidney disease, or unspecified chronic kidney disease: Secondary | ICD-10-CM | POA: Diagnosis present

## 2022-11-30 DIAGNOSIS — R488 Other symbolic dysfunctions: Secondary | ICD-10-CM | POA: Diagnosis not present

## 2022-11-30 DIAGNOSIS — S32010D Wedge compression fracture of first lumbar vertebra, subsequent encounter for fracture with routine healing: Secondary | ICD-10-CM | POA: Diagnosis not present

## 2022-11-30 DIAGNOSIS — I959 Hypotension, unspecified: Secondary | ICD-10-CM | POA: Diagnosis not present

## 2022-11-30 DIAGNOSIS — Z79891 Long term (current) use of opiate analgesic: Secondary | ICD-10-CM

## 2022-11-30 HISTORY — DX: Low back pain, unspecified: M54.50

## 2022-11-30 LAB — COMPREHENSIVE METABOLIC PANEL
ALT: 18 U/L (ref 0–44)
AST: 23 U/L (ref 15–41)
Albumin: 3.3 g/dL — ABNORMAL LOW (ref 3.5–5.0)
Alkaline Phosphatase: 68 U/L (ref 38–126)
Anion gap: 9 (ref 5–15)
BUN: 20 mg/dL (ref 8–23)
CO2: 24 mmol/L (ref 22–32)
Calcium: 10.1 mg/dL (ref 8.9–10.3)
Chloride: 104 mmol/L (ref 98–111)
Creatinine, Ser: 1.33 mg/dL — ABNORMAL HIGH (ref 0.44–1.00)
GFR, Estimated: 41 mL/min — ABNORMAL LOW (ref >60)
Glucose, Bld: 130 mg/dL — ABNORMAL HIGH (ref 70–99)
Potassium: 4.3 mmol/L (ref 3.5–5.1)
Sodium: 137 mmol/L (ref 135–145)
Total Bilirubin: 0.5 mg/dL (ref 0.3–1.2)
Total Protein: 6.2 g/dL — ABNORMAL LOW (ref 6.5–8.1)

## 2022-11-30 LAB — CBC WITH DIFFERENTIAL/PLATELET
Abs Immature Granulocytes: 0.02 10*3/uL (ref 0.00–0.07)
Basophils Absolute: 0 10*3/uL (ref 0.0–0.1)
Basophils Relative: 0 %
Eosinophils Absolute: 0.1 10*3/uL (ref 0.0–0.5)
Eosinophils Relative: 1 %
HCT: 42.5 % (ref 36.0–46.0)
Hemoglobin: 13.1 g/dL (ref 12.0–15.0)
Immature Granulocytes: 0 %
Lymphocytes Relative: 16 %
Lymphs Abs: 1.2 10*3/uL (ref 0.7–4.0)
MCH: 28.6 pg (ref 26.0–34.0)
MCHC: 30.8 g/dL (ref 30.0–36.0)
MCV: 92.8 fL (ref 80.0–100.0)
Monocytes Absolute: 0.6 10*3/uL (ref 0.1–1.0)
Monocytes Relative: 8 %
Neutro Abs: 5.6 10*3/uL (ref 1.7–7.7)
Neutrophils Relative %: 75 %
Platelets: 224 10*3/uL (ref 150–400)
RBC: 4.58 MIL/uL (ref 3.87–5.11)
RDW: 15.7 % — ABNORMAL HIGH (ref 11.5–15.5)
WBC: 7.6 10*3/uL (ref 4.0–10.5)
nRBC: 0 % (ref 0.0–0.2)

## 2022-11-30 LAB — MAGNESIUM: Magnesium: 1.9 mg/dL (ref 1.7–2.4)

## 2022-11-30 MED ORDER — LACTATED RINGERS IV SOLN: INTRAVENOUS | Status: AC

## 2022-11-30 MED ORDER — NALOXONE HCL 0.4 MG/ML IJ SOLN: 0.4000 mg | INTRAMUSCULAR | Status: DC | PRN

## 2022-11-30 MED ORDER — OXYCODONE HCL 5 MG PO TABS
10.0000 mg | ORAL_TABLET | ORAL | Status: DC | PRN
  Administered 2022-11-30 – 2022-12-06 (×10): 10 mg via ORAL
  Filled 2022-11-30 (×10): qty 2

## 2022-11-30 MED ORDER — ACETAMINOPHEN 650 MG RE SUPP: 650.0000 mg | Freq: Four times a day (QID) | RECTAL | Status: DC | PRN

## 2022-11-30 MED ORDER — ACETAMINOPHEN 325 MG PO TABS: 650.0000 mg | ORAL_TABLET | Freq: Four times a day (QID) | ORAL | Status: DC | PRN

## 2022-11-30 MED ORDER — HYDROMORPHONE HCL 1 MG/ML IJ SOLN
0.5000 mg | Freq: Once | INTRAMUSCULAR | Status: AC
  Administered 2022-11-30: 0.5 mg via INTRAVENOUS
  Filled 2022-11-30: qty 1

## 2022-11-30 MED ORDER — ONDANSETRON HCL 4 MG/2ML IJ SOLN
4.0000 mg | Freq: Four times a day (QID) | INTRAMUSCULAR | Status: DC | PRN
  Administered 2022-12-04: 4 mg via INTRAVENOUS
  Filled 2022-11-30: qty 2

## 2022-11-30 MED ORDER — MELATONIN 3 MG PO TABS
3.0000 mg | ORAL_TABLET | Freq: Every evening | ORAL | Status: DC | PRN
  Administered 2022-12-03: 3 mg via ORAL
  Filled 2022-11-30: qty 1

## 2022-11-30 MED ORDER — LIDOCAINE 5 % EX PTCH
1.0000 | MEDICATED_PATCH | CUTANEOUS | Status: DC
  Administered 2022-12-01 – 2022-12-07 (×6): 1 via TRANSDERMAL
  Filled 2022-11-30 (×6): qty 1

## 2022-11-30 MED ORDER — LACTATED RINGERS IV BOLUS
500.0000 mL | Freq: Once | INTRAVENOUS | Status: AC
  Administered 2022-12-01: 500 mL via INTRAVENOUS

## 2022-11-30 MED ORDER — HYDROMORPHONE HCL 1 MG/ML IJ SOLN: 0.5000 mg | INTRAMUSCULAR | Status: DC | PRN

## 2022-11-30 NOTE — ED Triage Notes (Signed)
Pt BIB [facility] from home d/t her chronic back issues worsening the past week. Cannot stand or bear wt to Lt leg d/t shooting pain from her back down that leg & describes it as a burning sensation. Has been needing to use a w/c more that she usually does & today was unable to stand at all d/t the pain, rated 10/10. Is currently getting ABT injections at her PCP for a UTI & is due to last one tomrrow. 142/54, 98 bpm, Resp 14, 96% RA, 170 CBG, A/Ox4.

## 2022-11-30 NOTE — ED Notes (Signed)
ED TO INPATIENT HANDOFF REPORT  ED Nurse Name and Phone #: Joselyn Glassman [phone])  S Name/Age/Gender Lisa Maxwell 77 y.o. female Room/Bed: 028C/028C  Code Status   Code Status: Full Code  Home/SNF/Other Home Patient oriented to: self, place, time, and situation Is this baseline? Yes   Triage Complete: Triage complete  Chief Complaint Low back pain [M54.50]  Triage Note Pt BIB [facility] from home d/t her chronic back issues worsening the past week. Cannot stand or bear wt to Lt leg d/t shooting pain from her back down that leg & describes it as a burning sensation. Has been needing to use a w/c more that she usually does & today was unable to stand at all d/t the pain, rated 10/10. Is currently getting ABT injections at her PCP for a UTI & is due to last one tomrrow. 142/54, 98 bpm, Resp 14, 96% RA, 170 CBG, A/Ox4.     Allergies No Known Allergies  Level of Care/Admitting Diagnosis ED Disposition     ED Disposition  Admit   Condition  --   Comment  Hospital Area: [facility] [[id number]]  Level of Care: Progressive [102]  Admit to Progressive based on following criteria: MULTISYSTEM THREATS such as stable sepsis, metabolic/electrolyte imbalance with or without encephalopathy that is responding to early treatment.  May place patient in observation at [facility] or [facility] if equivalent level of care is available:: No  Covid Evaluation: Asymptomatic - no recent exposure (last 10 days) testing not required  Diagnosis: Low back pain [[id number]]  Admitting Physician: Angie Fava [[id number]]  Attending Physician: Angie Fava [[id number]]          B Medical/Surgery History Past Medical History:  Diagnosis Date   CAD (coronary artery disease) 11/04/2016   Cancer (HCC)    skin cancer   Cauda equina compression (HCC) 08/28/2021   Cerebrovascular disease    Chronic atrial fibrillation (HCC) 09/28/2018   Chronic bilateral low back pain without  sciatica 03/08/2018   Chronic kidney disease    Chronic obstructive lung disease (HCC) 09/28/2018   Chronic pain syndrome 03/08/2018   Chronic renal failure 09/28/2018   Chronic, continuous use of opioids 03/04/2021   Last Assessment & Plan:  Formatting of this note might be different from the original. Patient and I have discussed the hazardous effects of continued opiate pain medication usage. Risks and benefits of above medications including but not limited to possibility of respiratory depression, sedation, and even death were discussed with the patient who expressed an understanding.  Patient did not displ   CKD (chronic kidney disease) stage 3, GFR 30-59 ml/min (HCC)    Class 1 obesity due to excess calories with serious comorbidity and body mass index (BMI) of 34.0 to 34.9 in adult 05/20/2021   Last Assessment & Plan: Formatting of this note might be different from the original. Patient educated about the detrimental effects of weight as it specifically pertains to pain management and overall health.  Patient's BMI 34.08 Encouraged healthy eating habits and routine low-impact cardiovascular exercises as tolerated.   Comprehensive diabetic foot examination, type 2 DM, encounter for [facility]) 04/29/2021   DDD (degenerative disc disease), lumbar 11/17/2018   Last Assessment & Plan:  Formatting of this note might be different from the original. See spinal stenosis plan   Diabetes (HCC)    Diabetes mellitus due to underlying condition with unspecified complications (HCC) 11/04/2016   Dyshidrotic eczema 04/29/2021   Dysrhythmia  afib   Essential hypertension 11/04/2016   Facet arthritis of lumbar region 03/08/2018   Facet joint disease 11/17/2018   Last Assessment & Plan:  Formatting of this note might be different from the original. See spinal stenosis plan   Foraminal stenosis of lumbar region 03/08/2018   Heart murmur    History of knee replacement, total, bilateral 11/17/2018   Last  Assessment & Plan:  Formatting of this note might be different from the original. Have recommended to her several times if any further concerns in regards to the pain status post arthroplasty, she is to follow up with her surgeon.   History of pulmonary embolism 11/28/2021   Hypertension    Hypertensive disorder 09/28/2018   Increased body mass index 09/28/2018   Lumbar radiculopathy 03/08/2018   Lumbar spondylosis 03/20/2019   Mixed dyslipidemia 11/04/2016   Morbid obesity (HCC) 11/04/2016   Muscle pain 04/29/2020   Osteoarthritis of left knee    PAF (paroxysmal atrial fibrillation) (HCC) 04/30/2015   Pain management contract agreement 04/13/2019   Last Assessment & Plan:  Formatting of this note might be different from the original. Contract updated today.  UDS completed.  [place] controlled substance registry reviewed and is consistent with her regimen.   Peripheral vascular disease (HCC) 09/28/2018   Preop cardiovascular exam 05/01/2019   Pressure injury of skin 11/21/2021   Pulmonary emboli [facility])    Pulmonary embolism (HCC) 11/21/2021   Renal insufficiency 04/30/2015   S/P insertion of spinal cord stimulator 07/13/2019   Last Assessment & Plan:  Formatting of this note might be different from the original. About 5 months status post insertion of spinal cord stimulator with excellent relief.  She continues to keep close follow-up with her representative and has been working to appropriately find best programs to get her adequate pain relief.   Sleep apnea    Smoker 09/28/2018   Spinal stenosis of lumbar region 03/20/2019   Spinal stenosis of lumbar region with neurogenic claudication 03/08/2018   Last Assessment & Plan:  Formatting of this note is different from the original. 77 year old female with chronic low back pain and bilateral, right greater than left, L4-5 radicular pain, and claudication. She has substantial amount of multifactorial degenerative thoracic and lumbar  spine pathology.  After being deemed an inappropriate open surgical candidate, and failing to respond to multiple in   Spondylolisthesis 03/20/2019   Spondylosis of lumbosacral region without myelopathy or radiculopathy 01/30/2019   Last Assessment & Plan:  Formatting of this note might be different from the original. See spinal stenosis plan   Tinea unguium 02/03/2022   Type 2 diabetes mellitus (HCC) 09/28/2018   Past Surgical History:  Procedure Laterality Date   ABDOMINAL HYSTERECTOMY     BACK SURGERY     BIOPSY  06/18/2022   Procedure: BIOPSY;  Surgeon: Lemar Lofty., MD;  Location: [facility] ENDOSCOPY;  Service: Gastroenterology;;   ESOPHAGOGASTRODUODENOSCOPY (EGD) WITH PROPOFOL N/A 06/18/2022   Procedure: ESOPHAGOGASTRODUODENOSCOPY (EGD) WITH PROPOFOL;  Surgeon: Lemar Lofty., MD;  Location: [facility] ENDOSCOPY;  Service: Gastroenterology;  Laterality: N/A;   EYE SURGERY     bilateral cataracts   IR ANGIOGRAM PULMONARY BILATERAL SELECTIVE  11/21/2021   IR ANGIOGRAM SELECTIVE EACH ADDITIONAL VESSEL  11/21/2021   IR ANGIOGRAM SELECTIVE EACH ADDITIONAL VESSEL  11/21/2021   IR THROMBECT PRIM MECH INIT (INCLU) MOD SED  11/21/2021   IR US GUIDE VASC ACCESS RIGHT  11/21/2021   JOINT REPLACEMENT Bilateral    knees  RADIOLOGY WITH ANESTHESIA N/A 11/21/2021   Procedure: IR WITH ANESTHESIA;  Surgeon: Radiologist, Medication, MD;  Location: [facility] OR;  Service: Radiology;  Laterality: N/A;   SAVORY DILATION N/A 06/18/2022   Procedure: SAVORY DILATION;  Surgeon: Meridee Score Netty Starring., MD;  Location: [facility] ENDOSCOPY;  Service: Gastroenterology;  Laterality: N/A;   SPINAL CORD STIMULATOR INSERTION N/A 07/07/2019   Procedure: LUMBAR SPINAL CORD STIMULATOR INSERTION;  Surgeon: Odette Fraction, MD;  Location: [facility] OR;  Service: Neurosurgery;  Laterality: N/A;   total left knee       A IV Location/Drains/Wounds Patient Lines/Drains/Airways Status     Active Line/Drains/Airways     Name Placement date  Placement time Site Days   Peripheral IV 11/30/22 20 G Right Antecubital 11/30/22  2219  Antecubital  less than 1   Pressure Injury 11/21/21 Buttocks Left;Medial Stage 2 -  Partial thickness loss of dermis presenting as a shallow open injury with a red, pink wound bed without slough. 11/21/21  1112  -- 374            Intake/Output Last 24 hours  Intake/Output Summary (Last 24 hours) at 11/30/2022 2336 Last data filed at 11/30/2022 2030 Gross per 24 hour  Intake --  Output 275 ml  Net -275 ml    Labs/Imaging Results for orders placed or performed during the hospital encounter of 11/30/22 (from the past 48 hour(s))  Comprehensive metabolic panel     Status: Abnormal   Collection Time: 11/30/22  3:07 PM  Result Value Ref Range   Sodium 137 135 - 145 mmol/L   Potassium 4.3 3.5 - 5.1 mmol/L   Chloride 104 98 - 111 mmol/L   CO2 24 22 - 32 mmol/L   Glucose, Bld 130 (H) 70 - 99 mg/dL    Comment: Glucose reference range applies only to samples taken after fasting for at least 8 hours.   BUN 20 8 - 23 mg/dL   Creatinine, Ser 9.81 (H) 0.44 - 1.00 mg/dL   Calcium 19.1 8.9 - 47.8 mg/dL   Total Protein 6.2 (L) 6.5 - 8.1 g/dL   Albumin 3.3 (L) 3.5 - 5.0 g/dL   AST 23 15 - 41 U/L   ALT 18 0 - 44 U/L   Alkaline Phosphatase 68 38 - 126 U/L   Total Bilirubin 0.5 0.3 - 1.2 mg/dL   GFR, Estimated 41 (L) >60 mL/min    Comment: (NOTE) Calculated using the CKD-EPI Creatinine Equation (2021)    Anion gap 9 5 - 15    Comment: Performed at [facility], [address], [place], [place] [address]  CBC with Differential     Status: Abnormal   Collection Time: 11/30/22  3:07 PM  Result Value Ref Range   WBC 7.6 4.0 - 10.5 K/uL   RBC 4.58 3.87 - 5.11 MIL/uL   Hemoglobin 13.1 12.0 - 15.0 g/dL   HCT 13.0 86.5 - 78.4 %   MCV 92.8 80.0 - 100.0 fL   MCH 28.6 26.0 - 34.0 pg   MCHC 30.8 30.0 - 36.0 g/dL   RDW 69.6 (H) 29.5 - 28.4 %   Platelets 224 150 - 400 K/uL   nRBC 0.0 0.0 - 0.2 %    Neutrophils Relative % 75 %   Neutro Abs 5.6 1.7 - 7.7 K/uL   Lymphocytes Relative 16 %   Lymphs Abs 1.2 0.7 - 4.0 K/uL   Monocytes Relative 8 %   Monocytes Absolute 0.6 0.1 - 1.0 K/uL  Eosinophils Relative 1 %   Eosinophils Absolute 0.1 0.0 - 0.5 K/uL   Basophils Relative 0 %   Basophils Absolute 0.0 0.0 - 0.1 K/uL   Immature Granulocytes 0 %   Abs Immature Granulocytes 0.02 0.00 - 0.07 K/uL    Comment: Performed at [facility], [address] [place], [place] [address]   MR LUMBAR SPINE WO CONTRAST  Result Date: 11/30/2022 CLINICAL DATA:  Low back pain, prior surgery, new symptoms left L1-L3 radiculopathy, weakness and pain with left hip flexion EXAM: MRI LUMBAR SPINE WITHOUT CONTRAST TECHNIQUE: Multiplanar, multisequence MR imaging of the lumbar spine was performed. No intravenous contrast was administered. COMPARISON:  Lumbar spine CT dated 06/09/2022, lumbar spine MRI dated 06/09/2022 FINDINGS: Limitations: Assessment of the surgical levels is limited due to susceptibility artifact from metallic fusion hardware. Segmentation:  Standard. Alignment: Grade 1 anterolisthesis of L4 on L5. Trace retrolisthesis of L1 on L2. Vertebrae: Redemonstrated postsurgical changes from L2-L3 spinal fusion. Compared to prior exam Conus medullaris and cauda equina: Conus extends to the T12-L1 disc space level. Conus and cauda equina appear normal. Paraspinal and other soft tissues: Compared to prior exam there is new T2 hyperintense signal in the left psoas muscle the L1-L2 level. (Series 6, image 13) measuring 1.3 x 1 2 cm. There may be a small locule of air in this region. Disc levels: Compared to prior exam there is likely new nonspecific fluid in the ventral epidural space at the L1 and L2 levels (series 4, image 10). There is severe spinal canal stenosis L1-L2, L3-L4 levels. Moderate spinal canal stenosis at L4-L5. There is severe left neural foraminal narrowing at L4-L5, unchanged from prior exam.  There are severe multilevel facet degenerative changes, most notably at L4-L5 and L5-S1. IMPRESSION: 1. Compared to prior exam, there is new T2 hyperintense signal in the left psoas muscle at the L1-L2 level measuring 1.3 x 1.2 cm. There may be a small locule of air in this region. Findings are concerning for an abscess. Recommend further evaluation with contrast enhanced lumbar spine MRI. 2. Compared to prior exam, there is likely new nonspecific fluid in the ventral epidural space at the L1 and L2 levels. Recommend attention on contrast-enhanced MRI of the lumbar spine to exclude the possibility for ventral epidural abscess. 3. Redemonstrated postsurgical changes from L2-L3 spinal fusion. 4. Severe spinal canal stenosis at L1-L2 and L3-L4 levels. 5. Severe left neural foraminal narrowing at L4-L5. Electronically Signed   By: Lorenza Cambridge M.D.   On: 11/30/2022 20:48   DG Hip Unilat W or Wo Pelvis 2-3 Views Left  Result Date: 11/30/2022 CLINICAL DATA:  Hip pain.  Back pain. EXAM: DG HIP (WITH OR WITHOUT PELVIS) 2-3V LEFT COMPARISON:  None Available. FINDINGS: Pelvis is intact with normal and symmetric sacroiliac joints. No acute fracture or dislocation. No aggressive osseous lesion. Visualized sacral arcuate lines are unremarkable. Unremarkable symphysis pubis. There are mild degenerative changes of bilateral hip joints without significant joint space narrowing. Osteophytosis of the superior acetabulum. No radiopaque foreign bodies. Neurostimulator device battery pack is seen overlying the right iliac wing. IMPRESSION: 1. Mild bilateral hip osteoarthritis. Electronically Signed   By: Jules Schick M.D.   On: 11/30/2022 16:12   DG Lumbar Spine Complete  Result Date: 11/30/2022 CLINICAL DATA:  Back and hip pain. EXAM: LUMBAR SPINE - COMPLETE 4+ VIEW COMPARISON:  Lumbar spine 09/09/2022 and MRI 06/09/2022 FINDINGS: Scoliosis in the lumbar spine with the apex towards the right at L1-L2.  Posterior lumbar  interbody fusion at L2-L3 with bilateral pedicle screws, rods and an interbody device. Surgical hardware is similar to the previous examination. Deformity along the anterior inferior endplate pf L1 appears unchanged. Stable grade 1 anterolisthesis of L4 on L5. Extensive facet arthropathy in the lower lumbar spine. Patient has a spinal stimulator device in the thoracic spine. IMPRESSION: 1. No acute abnormality in the lumbar spine. 2. Stable postsurgical changes at L2-L3. 3. Chronic scoliosis and degenerative changes in the lumbar spine. Electronically Signed   By: Richarda Overlie M.D.   On: 11/30/2022 16:07    Pending Labs Unresulted Labs (From admission, onward)     Start     Ordered   12/01/22 0500  CBC with Differential/Platelet  Tomorrow morning,   R        11/30/22 2317   12/01/22 0500  Comprehensive metabolic panel  Tomorrow morning,   R        11/30/22 2317   12/01/22 0500  Magnesium  Tomorrow morning,   R        11/30/22 2317   11/30/22 2318  Magnesium  Add-on,   AD        11/30/22 2317            Vitals/Pain Today's Vitals   11/30/22 2230 11/30/22 2240 11/30/22 2300 11/30/22 2310  BP: (!) 99/59  (!) 94/43   Pulse: (!) 124 94 (!) 127 (!) 101  Resp:      Temp: 98.4 F (36.9 C)     TempSrc: Oral     SpO2: 95% 91% 94% 93%  Weight:      Height:      PainSc:        Isolation Precautions No active isolations  Medications Medications  oxyCODONE (Oxy IR/ROXICODONE) immediate release tablet 10 mg (10 mg Oral Given 11/30/22 2053)  acetaminophen (TYLENOL) tablet 650 mg (has no administration in time range)    Or  acetaminophen (TYLENOL) suppository 650 mg (has no administration in time range)  melatonin tablet 3 mg (has no administration in time range)  ondansetron (ZOFRAN) injection 4 mg (has no administration in time range)  lactated ringers bolus 500 mL (has no administration in time range)  lactated ringers infusion (has no administration in time range)  naloxone (NARCAN)  injection 0.4 mg (has no administration in time range)  HYDROmorphone (DILAUDID) injection 0.5 mg (has no administration in time range)  lidocaine (LIDODERM) 5 % 1 patch (has no administration in time range)  HYDROmorphone (DILAUDID) injection 0.5 mg (0.5 mg Intravenous Given 11/30/22 2224)    Mobility walks with person assist     Focused Assessments    R Recommendations: See Admitting Provider Note  Report given to:   Additional Notes:

## 2022-11-30 NOTE — ED Provider Notes (Signed)
[place] EMERGENCY DEPARTMENT AT [facility] Provider Note   CSN: [id number] Arrival date & time: 11/30/22  1411     History  Chief Complaint  Patient presents with   Back Pain    Lisa Maxwell is a 77 y.o. female with a history of cauda equina compression s/p L2-L3 decompression and fusion in 2023, presenting from home with worsening weakness and pain in her left hip and leg.  Patient reports this began about 4 days ago.  She denies any falls or trauma.  She reports a strong burning sensation around her left buttocks down to her left knee, not to the toes.  She says the pain has been severe.  She has been taking her Norco 5 mg as well as Tylenol with no relief.  She has an appointment coming up on September 4 with Dr. Conchita Paris her neurosurgeon.  She has been wearing a TLSO brace since March 2024 when she was in the hospital for worsening back pain.  At that time, per my review of the records by the consulting neurosurgeons, she was deemed to be not a good surgical candidate, and advised for continued conservative management with TLSO brace.  She had an MRI performed at that time as shown below.  The patient reports after hospitalization she was discharged to rehab for nearly a month.  However after leaving rehab she was able to ambulate with a walker in the house.  She says she otherwise uses a wheelchair.  The past 3 to 4 days she has not been able to get up and use her walker due to pain in her left hip.  The patient does have an MRI compatible spinal stimulator.  Her husband reports he is going home to get the remote control and charging pad.  MRI L spine 06/09/22:  IMPRESSION: 1. Advanced and generalized lumbar spine degeneration with scoliosis and L4-5 anterolisthesis. 2. L1 inferior endplate fracture which is subacute to chronic. L1 and L2 vertebral body edema which could be degenerative or traumatic. 3. T11-12 severe degeneration with spinal stenosis and apparent  cord impingement that is likely accentuated by susceptibility artifact from spinal cord stimulator leads. On axial T2 weighted imaging there is question of lower cord edema, correlate for myelopathy. Severe biforaminal impingement at this level. 4. Foraminal stenosis on the right at L1-2, L3-4, and L4-5. 5. Left foraminal stenosis primarily at L1-2. 6. Moderate spinal stenosis at L3-4 and L4-5.  HPI     Home Medications Prior to Admission medications   Medication Sig Start Date End Date Taking? Authorizing Provider  albuterol (VENTOLIN HFA) 108 (90 Base) MCG/ACT inhaler Inhale into the lungs every 6 (six) hours as needed for wheezing or shortness of breath.    [provider]  apixaban (ELIQUIS) 5 MG TABS tablet Take 1 tablet (5 mg total) by mouth 2 (two) times daily. 11/28/21   Revankar, Aundra Dubin, MD  bethanechol (URECHOLINE) 50 MG tablet Take 50 mg by mouth 2 (two) times daily. 07/23/22   [provider]  Coenzyme Q10 (CO Q-10) 200 MG CAPS Take 200 mg by mouth in the morning.    [provider]  Cyanocobalamin (VITAMIN B-12) 500 MCG SUBL Place 500 mcg under the tongue in the morning.    [provider]  diphenhydrAMINE (BENADRYL) 25 MG tablet Take 25 mg by mouth in the morning.    [provider]  docusate sodium (COLACE) 100 MG capsule Take 100 mg by mouth daily. 10/08/21  [provider]  fluticasone furoate-vilanterol (BREO ELLIPTA) 100-25 MCG/ACT AEPB Inhale 1-2 puffs into the lungs daily.    [provider]  gabapentin (NEURONTIN) 600 MG tablet Take 600 mg by mouth at bedtime. 04/06/21   [provider]  HYDROcodone-acetaminophen (NORCO) 10-325 MG tablet Take 0.5 tablets by mouth 4 (four) times daily.    [provider]  Iron, Ferrous Sulfate, 325 (65 Fe) MG TABS Take 65 mg of iron by mouth daily with breakfast.    [provider]  latanoprost (XALATAN) 0.005 % ophthalmic solution Place 1 drop into  both eyes at bedtime. 11/17/21   [provider]  lovastatin (MEVACOR) 40 MG tablet Take 40 mg by mouth at bedtime.     [provider]  methocarbamol (ROBAXIN) 500 MG tablet Take 1 tablet (500 mg total) by mouth every 6 (six) hours as needed for muscle spasms. Patient taking differently: Take 250 mg by mouth 3 (three) times daily. 08/29/21   Lisbeth Renshaw, MD  Multiple Vitamins-Minerals (CENTRUM SILVER 50+WOMEN) TABS Take 1 tablet by mouth daily with breakfast.    [provider]  naloxone (NARCAN) nasal spray 4 mg/0.1 mL Place 1 spray into the nose daily as needed (accidental overdose).    [provider]  nitroGLYCERIN (NITROSTAT) 0.4 MG SL tablet Place 0.4 mg under the tongue every 5 (five) minutes as needed for chest pain.    [provider]  pantoprazole (PROTONIX) 40 MG tablet Take 1 tablet (40 mg total) by mouth daily. 09/02/22   Lynann Bologna, MD  RYBELSUS 3 MG TABS Take 3 mg by mouth daily before breakfast. 05/12/21   [provider]  tamsulosin (FLOMAX) 0.4 MG CAPS capsule Take 0.4 mg by mouth 2 (two) times daily. 07/23/22   [provider]  triamcinolone ointment (KENALOG) 0.5 % Apply 1 Application topically See admin instructions. Apply to affected areas of both feet two to three times a week    [provider]  trimethoprim (TRIMPEX) 100 MG tablet Take 100 mg by mouth in the morning. 10/25/21   [provider]      Allergies    Patient has no known allergies.    Review of Systems   Review of Systems  Physical Exam Updated Vital Signs BP (!) 99/59   Pulse 94   Temp 98.4 F (36.9 C) (Oral)   Resp 16   Ht 4\' 8"  (1.422 m)   Wt 70.3 kg   SpO2 91%   BMI 34.75 kg/m  Physical Exam Constitutional:      General: She is not in acute distress.    Appearance: She is obese.     Comments: Wearing TLSO brace  HENT:     Head: Normocephalic and atraumatic.  Eyes:     Conjunctiva/sclera: Conjunctivae  normal.     Pupils: Pupils are equal, round, and reactive to light.  Cardiovascular:     Rate and Rhythm: Normal rate and regular rhythm.  Pulmonary:     Effort: Pulmonary effort is normal. No respiratory distress.  Abdominal:     General: There is no distension.     Tenderness: There is no abdominal tenderness.  Skin:    General: Skin is warm and dry.  Neurological:     General: No focal deficit present.     Mental Status: She is alert. Mental status is at baseline.     Comments: 2/5 strength with plantar and dorsiflexion bilaterally (patient reports this is chronic), sensation intact, no  saddle anesthesia, 4/5 strength right hip and knee flexion, 2/5 strength left hip and knee extension (pain limits exam)  Psychiatric:        Mood and Affect: Mood normal.        Behavior: Behavior normal.     ED Results / Procedures / Treatments   Labs (all labs ordered are listed, but only abnormal results are displayed) Labs Reviewed  COMPREHENSIVE METABOLIC PANEL - Abnormal; Notable for the following components:      Result Value   Glucose, Bld 130 (*)    Creatinine, Ser 1.33 (*)    Total Protein 6.2 (*)    Albumin 3.3 (*)    GFR, Estimated 41 (*)    All other components within normal limits  CBC WITH DIFFERENTIAL/PLATELET - Abnormal; Notable for the following components:   RDW 15.7 (*)    All other components within normal limits    EKG None  Radiology MR LUMBAR SPINE WO CONTRAST  Result Date: 11/30/2022 CLINICAL DATA:  Low back pain, prior surgery, new symptoms left L1-L3 radiculopathy, weakness and pain with left hip flexion EXAM: MRI LUMBAR SPINE WITHOUT CONTRAST TECHNIQUE: Multiplanar, multisequence MR imaging of the lumbar spine was performed. No intravenous contrast was administered. COMPARISON:  Lumbar spine CT dated 06/09/2022, lumbar spine MRI dated 06/09/2022 FINDINGS: Limitations: Assessment of the surgical levels is limited due to susceptibility artifact from metallic  fusion hardware. Segmentation:  Standard. Alignment: Grade 1 anterolisthesis of L4 on L5. Trace retrolisthesis of L1 on L2. Vertebrae: Redemonstrated postsurgical changes from L2-L3 spinal fusion. Compared to prior exam Conus medullaris and cauda equina: Conus extends to the T12-L1 disc space level. Conus and cauda equina appear normal. Paraspinal and other soft tissues: Compared to prior exam there is new T2 hyperintense signal in the left psoas muscle the L1-L2 level. (Series 6, image 13) measuring 1.3 x 1 2 cm. There may be a small locule of air in this region. Disc levels: Compared to prior exam there is likely new nonspecific fluid in the ventral epidural space at the L1 and L2 levels (series 4, image 10). There is severe spinal canal stenosis L1-L2, L3-L4 levels. Moderate spinal canal stenosis at L4-L5. There is severe left neural foraminal narrowing at L4-L5, unchanged from prior exam. There are severe multilevel facet degenerative changes, most notably at L4-L5 and L5-S1. IMPRESSION: 1. Compared to prior exam, there is new T2 hyperintense signal in the left psoas muscle at the L1-L2 level measuring 1.3 x 1.2 cm. There may be a small locule of air in this region. Findings are concerning for an abscess. Recommend further evaluation with contrast enhanced lumbar spine MRI. 2. Compared to prior exam, there is likely new nonspecific fluid in the ventral epidural space at the L1 and L2 levels. Recommend attention on contrast-enhanced MRI of the lumbar spine to exclude the possibility for ventral epidural abscess. 3. Redemonstrated postsurgical changes from L2-L3 spinal fusion. 4. Severe spinal canal stenosis at L1-L2 and L3-L4 levels. 5. Severe left neural foraminal narrowing at L4-L5. Electronically Signed   By: Lorenza Cambridge M.D.   On: 11/30/2022 20:48   DG Hip Unilat W or Wo Pelvis 2-3 Views Left  Result Date: 11/30/2022 CLINICAL DATA:  Hip pain.  Back pain. EXAM: DG HIP (WITH OR WITHOUT PELVIS) 2-3V LEFT  COMPARISON:  None Available. FINDINGS: Pelvis is intact with normal and symmetric sacroiliac joints. No acute fracture or dislocation. No aggressive osseous lesion. Visualized sacral arcuate lines are unremarkable. Unremarkable symphysis pubis. There  are mild degenerative changes of bilateral hip joints without significant joint space narrowing. Osteophytosis of the superior acetabulum. No radiopaque foreign bodies. Neurostimulator device battery pack is seen overlying the right iliac wing. IMPRESSION: 1. Mild bilateral hip osteoarthritis. Electronically Signed   By: Jules Schick M.D.   On: 11/30/2022 16:12   DG Lumbar Spine Complete  Result Date: 11/30/2022 CLINICAL DATA:  Back and hip pain. EXAM: LUMBAR SPINE - COMPLETE 4+ VIEW COMPARISON:  Lumbar spine 09/09/2022 and MRI 06/09/2022 FINDINGS: Scoliosis in the lumbar spine with the apex towards the right at L1-L2. Posterior lumbar interbody fusion at L2-L3 with bilateral pedicle screws, rods and an interbody device. Surgical hardware is similar to the previous examination. Deformity along the anterior inferior endplate pf L1 appears unchanged. Stable grade 1 anterolisthesis of L4 on L5. Extensive facet arthropathy in the lower lumbar spine. Patient has a spinal stimulator device in the thoracic spine. IMPRESSION: 1. No acute abnormality in the lumbar spine. 2. Stable postsurgical changes at L2-L3. 3. Chronic scoliosis and degenerative changes in the lumbar spine. Electronically Signed   By: Richarda Overlie M.D.   On: 11/30/2022 16:07    Procedures Procedures    Medications Ordered in ED Medications  oxyCODONE (Oxy IR/ROXICODONE) immediate release tablet 10 mg (10 mg Oral Given 11/30/22 2053)  HYDROmorphone (DILAUDID) injection 0.5 mg (0.5 mg Intravenous Given 11/30/22 2224)    ED Course/ Medical Decision Making/ A&P Clinical Course as of 11/30/22 2309  Mon Nov 30, 2022  2052 Will consult with NSGY regarding MRI findings.   [MT]  2115 I spoke to Dr  Dutch Quint from neurosurgery who was able to review the patient's MRI imaging, and feels that these edematous and fluid findings are likely related to edema from her disc disease, much less likely infectious etiology. The specialist did not feel the patient needed an emergent MRI with contrast at this time. He recommends continued pain control, if the patient is able to ambulate or tolerate her pain she can follow-up as an outpatient with her neurosurgeon next week as scheduled. [MT]  2308 Admitted to Dr Ralph Leyden hospitalist [MT]    Clinical Course User Index [MT] Renaye Rakers Kermit Balo, MD                                 Medical Decision Making Amount and/or Complexity of Data Reviewed Radiology: ordered.  Risk Prescription drug management. Decision regarding hospitalization.   Patient is here with acute on chronic worsening weakness now of her left lower extremity, describing an L1-L3 neuropathy type pattern radicular symptoms on the left side.  She denies any falls or preceding trauma.  X-ray imaging was ordered from triage and proceed with interpreted, showing chronic degenerative changes, but no evidence of acute fracture.  However given the patient's complicated history including surgical history and cauda equina compression, she will need an MRI scan.  Her husband is going home to get the spinal stimulator remote and to finish fully charging her battery, which is almost fully charged.  MRI tech confirms that they would be able to perform the scan otherwise.  There is no flaccid paralysis or evidence of high-grade cord compression based on her initial exam.  The exam is somewhat limited by pain.  Per my review of external records including prior MRI scan and neurosurgical notes, she is felt to be strongly not a candidate for surgical intervention.  We will try to give  her a higher dose of pain medication here with oxycodone 10 mg.  Labs were reviewed with no emergent findings per my interpretation.   MRI scan of the lumbar spine without contrast was ordered and personally reviewed and interpreted, agree with the radiologist, there is significant multilevel stenosis of the spinal cord, some ventral cord edema and also questionable psoas edema or fluid collection.  The case was discussed with the on-call neurosurgeon who reviewed the images, please see ED course.  No immediate emergent plan for neurosurgery, or need for emergent repeat MRI scan at this time, as this is unlikely to be infectious and more likely to be inflammatory edema or reactive from significant spinal disease per neurosurgeon.   On reassessment after the oxycodone the patient reports no significant improvement of her pain, which is still intractable.  At this point we will look for medical admission for pain control.  Neurosurgery could be consulted in the hospital tomorrow.  Patient wears TLSO brace.        Final Clinical Impression(s) / ED Diagnoses Final diagnoses:  Chronic bilateral low back pain with left-sided sciatica    Rx / DC Orders ED Discharge Orders     None         Terald Sleeper, MD 11/30/22 2309

## 2022-11-30 NOTE — ED Notes (Signed)
Patient transported to X-ray 

## 2022-11-30 NOTE — H&P (Signed)
History and Physical      Lisa Maxwell WNU:[id number] DOB: May 12, 1945 DOA: 11/30/2022; DOS: 11/30/2022  PCP: Paulina Fusi, MD *** Patient coming from: home ***  I have personally briefly reviewed patient's old medical records in [facility]  Chief Complaint: ***  HPI: Lisa Maxwell is a 77 y.o. female with medical history significant for *** who is admitted to [facility] on 11/30/2022 with *** after presenting from home*** to [facility] ED complaining of ***.    ***       ***   ED Course:  Vital signs in the ED were notable for the following: ***  Labs were notable for the following: ***  Per my interpretation, EKG in ED demonstrated the following:  ***  Imaging in the ED, per corresponding formal radiology read, was notable for the following:  ***  While in the ED, the following were administered: ***  Subsequently, the patient was admitted  ***  ***red    Review of Systems: As per HPI otherwise 10 point review of systems negative.   Past Medical History:  Diagnosis Date   CAD (coronary artery disease) 11/04/2016   Cancer (HCC)    skin cancer   Cauda equina compression (HCC) 08/28/2021   Cerebrovascular disease    Chronic atrial fibrillation (HCC) 09/28/2018   Chronic bilateral low back pain without sciatica 03/08/2018   Chronic kidney disease    Chronic obstructive lung disease (HCC) 09/28/2018   Chronic pain syndrome 03/08/2018   Chronic renal failure 09/28/2018   Chronic, continuous use of opioids 03/04/2021   Last Assessment & Plan:  Formatting of this note might be different from the original. Patient and I have discussed the hazardous effects of continued opiate pain medication usage. Risks and benefits of above medications including but not limited to possibility of respiratory depression, sedation, and even death were discussed with the patient who expressed an understanding.  Patient did not displ   CKD (chronic kidney disease) stage  3, GFR 30-59 ml/min (HCC)    Class 1 obesity due to excess calories with serious comorbidity and body mass index (BMI) of 34.0 to 34.9 in adult 05/20/2021   Last Assessment & Plan: Formatting of this note might be different from the original. Patient educated about the detrimental effects of weight as it specifically pertains to pain management and overall health.  Patient's BMI 34.08 Encouraged healthy eating habits and routine low-impact cardiovascular exercises as tolerated.   Comprehensive diabetic foot examination, type 2 DM, encounter for (HCC) 04/29/2021   DDD (degenerative disc disease), lumbar 11/17/2018   Last Assessment & Plan:  Formatting of this note might be different from the original. See spinal stenosis plan   Diabetes (HCC)    Diabetes mellitus due to underlying condition with unspecified complications (HCC) 11/04/2016   Dyshidrotic eczema 04/29/2021   Dysrhythmia    afib   Essential hypertension 11/04/2016   Facet arthritis of lumbar region 03/08/2018   Facet joint disease 11/17/2018   Last Assessment & Plan:  Formatting of this note might be different from the original. See spinal stenosis plan   Foraminal stenosis of lumbar region 03/08/2018   Heart murmur    History of knee replacement, total, bilateral 11/17/2018   Last Assessment & Plan:  Formatting of this note might be different from the original. Have recommended to her several times if any further concerns in regards to the pain status post arthroplasty, she is to follow up with her surgeon.  History of pulmonary embolism 11/28/2021   Hypertension    Hypertensive disorder 09/28/2018   Increased body mass index 09/28/2018   Lumbar radiculopathy 03/08/2018   Lumbar spondylosis 03/20/2019   Mixed dyslipidemia 11/04/2016   Morbid obesity (HCC) 11/04/2016   Muscle pain 04/29/2020   Osteoarthritis of left knee    PAF (paroxysmal atrial fibrillation) (HCC) 04/30/2015   Pain management contract agreement 04/13/2019    Last Assessment & Plan:  Formatting of this note might be different from the original. Contract updated today.  UDS completed.  [place] controlled substance registry reviewed and is consistent with her regimen.   Peripheral vascular disease (HCC) 09/28/2018   Preop cardiovascular exam 05/01/2019   Pressure injury of skin 11/21/2021   Pulmonary emboli [facility])    Pulmonary embolism (HCC) 11/21/2021   Renal insufficiency 04/30/2015   S/P insertion of spinal cord stimulator 07/13/2019   Last Assessment & Plan:  Formatting of this note might be different from the original. About 5 months status post insertion of spinal cord stimulator with excellent relief.  She continues to keep close follow-up with her representative and has been working to appropriately find best programs to get her adequate pain relief.   Sleep apnea    Smoker 09/28/2018   Spinal stenosis of lumbar region 03/20/2019   Spinal stenosis of lumbar region with neurogenic claudication 03/08/2018   Last Assessment & Plan:  Formatting of this note is different from the original. 77 year old female with chronic low back pain and bilateral, right greater than left, L4-5 radicular pain, and claudication. She has substantial amount of multifactorial degenerative thoracic and lumbar spine pathology.  After being deemed an inappropriate open surgical candidate, and failing to respond to multiple in   Spondylolisthesis 03/20/2019   Spondylosis of lumbosacral region without myelopathy or radiculopathy 01/30/2019   Last Assessment & Plan:  Formatting of this note might be different from the original. See spinal stenosis plan   Tinea unguium 02/03/2022   Type 2 diabetes mellitus (HCC) 09/28/2018    Past Surgical History:  Procedure Laterality Date   ABDOMINAL HYSTERECTOMY     BACK SURGERY     BIOPSY  06/18/2022   Procedure: BIOPSY;  Surgeon: Lemar Lofty., MD;  Location: [facility] ENDOSCOPY;  Service: Gastroenterology;;    ESOPHAGOGASTRODUODENOSCOPY (EGD) WITH PROPOFOL N/A 06/18/2022   Procedure: ESOPHAGOGASTRODUODENOSCOPY (EGD) WITH PROPOFOL;  Surgeon: Lemar Lofty., MD;  Location: [facility] ENDOSCOPY;  Service: Gastroenterology;  Laterality: N/A;   EYE SURGERY     bilateral cataracts   IR ANGIOGRAM PULMONARY BILATERAL SELECTIVE  11/21/2021   IR ANGIOGRAM SELECTIVE EACH ADDITIONAL VESSEL  11/21/2021   IR ANGIOGRAM SELECTIVE EACH ADDITIONAL VESSEL  11/21/2021   IR THROMBECT PRIM MECH INIT (INCLU) MOD SED  11/21/2021   IR US GUIDE VASC ACCESS RIGHT  11/21/2021   JOINT REPLACEMENT Bilateral    knees   RADIOLOGY WITH ANESTHESIA N/A 11/21/2021   Procedure: IR WITH ANESTHESIA;  Surgeon: Radiologist, Medication, MD;  Location: [facility] OR;  Service: Radiology;  Laterality: N/A;   SAVORY DILATION N/A 06/18/2022   Procedure: SAVORY DILATION;  Surgeon: Meridee Score Netty Starring., MD;  Location: [facility] ENDOSCOPY;  Service: Gastroenterology;  Laterality: N/A;   SPINAL CORD STIMULATOR INSERTION N/A 07/07/2019   Procedure: LUMBAR SPINAL CORD STIMULATOR INSERTION;  Surgeon: Odette Fraction, MD;  Location: [facility] OR;  Service: Neurosurgery;  Laterality: N/A;   total left knee      Social History:  reports that she has been smoking cigarettes. She started  smoking about 65 years ago. She has a 31 pack-year smoking history. She has been exposed to tobacco smoke. She has never used smokeless tobacco. She reports that she does not drink alcohol and does not use drugs.   No Known Allergies  Family History  Problem Relation Age of Onset   Diabetes Mother     Family history reviewed and not pertinent ***   Prior to Admission medications   Medication Sig Start Date End Date Taking? Authorizing Provider  albuterol (VENTOLIN HFA) 108 (90 Base) MCG/ACT inhaler Inhale into the lungs every 6 (six) hours as needed for wheezing or shortness of breath.    [provider]  apixaban (ELIQUIS) 5 MG TABS tablet Take 1 tablet (5 mg total) by mouth 2  (two) times daily. 11/28/21   Revankar, Aundra Dubin, MD  bethanechol (URECHOLINE) 50 MG tablet Take 50 mg by mouth 2 (two) times daily. 07/23/22   [provider]  Coenzyme Q10 (CO Q-10) 200 MG CAPS Take 200 mg by mouth in the morning.    [provider]  Cyanocobalamin (VITAMIN B-12) 500 MCG SUBL Place 500 mcg under the tongue in the morning.    [provider]  diphenhydrAMINE (BENADRYL) 25 MG tablet Take 25 mg by mouth in the morning.    [provider]  docusate sodium (COLACE) 100 MG capsule Take 100 mg by mouth daily. 10/08/21   [provider]  fluticasone furoate-vilanterol (BREO ELLIPTA) 100-25 MCG/ACT AEPB Inhale 1-2 puffs into the lungs daily.    [provider]  gabapentin (NEURONTIN) 600 MG tablet Take 600 mg by mouth at bedtime. 04/06/21   [provider]  HYDROcodone-acetaminophen (NORCO) 10-325 MG tablet Take 0.5 tablets by mouth 4 (four) times daily.    [provider]  Iron, Ferrous Sulfate, 325 (65 Fe) MG TABS Take 65 mg of iron by mouth daily with breakfast.    [provider]  latanoprost (XALATAN) 0.005 % ophthalmic solution Place 1 drop into both eyes at bedtime. 11/17/21   [provider]  lovastatin (MEVACOR) 40 MG tablet Take 40 mg by mouth at bedtime.     [provider]  methocarbamol (ROBAXIN) 500 MG tablet Take 1 tablet (500 mg total) by mouth every 6 (six) hours as needed for muscle spasms. Patient taking differently: Take 250 mg by mouth 3 (three) times daily. 08/29/21   Lisbeth Renshaw, MD  Multiple Vitamins-Minerals (CENTRUM SILVER 50+WOMEN) TABS Take 1 tablet by mouth daily with breakfast.    [provider]  naloxone (NARCAN) nasal spray 4 mg/0.1 mL Place 1 spray into the nose daily as needed (accidental overdose).    [provider]  nitroGLYCERIN (NITROSTAT) 0.4 MG SL tablet Place 0.4 mg under the tongue every 5 (five) minutes as needed for chest pain.     [provider]  pantoprazole (PROTONIX) 40 MG tablet Take 1 tablet (40 mg total) by mouth daily. 09/02/22   Lynann Bologna, MD  RYBELSUS 3 MG TABS Take 3 mg by mouth daily before breakfast. 05/12/21   [provider]  tamsulosin (FLOMAX) 0.4 MG CAPS capsule Take 0.4 mg by mouth 2 (two) times daily. 07/23/22   [provider]  triamcinolone ointment (KENALOG) 0.5 % Apply 1 Application topically See admin instructions. Apply to affected areas of both feet two to three times a week    [provider]  trimethoprim (TRIMPEX) 100 MG tablet Take 100 mg by mouth in the morning. 10/25/21  [provider]     Objective    Physical Exam: Vitals:   11/30/22 2230 11/30/22 2240 11/30/22 2300 11/30/22 2310  BP: (!) 99/59  (!) 94/43   Pulse: (!) 124 94 (!) 127 (!) 101  Resp:      Temp: 98.4 F (36.9 C)     TempSrc: Oral     SpO2: 95% 91% 94% 93%  Weight:      Height:        General: appears to be stated age; alert, oriented Skin: warm, dry, no rash Head:  AT/Laurium Mouth:  Oral mucosa membranes appear moist, normal dentition Neck: supple; trachea midline Heart:  RRR; did not appreciate any M/R/G Lungs: CTAB, did not appreciate any wheezes, rales, or rhonchi Abdomen: + BS; soft, ND, NT Vascular: 2+ pedal pulses b/l; 2+ radial pulses b/l Extremities: no peripheral edema, no muscle wasting Neuro: strength and sensation intact in upper and lower extremities b/l ***   *** Neuro: 5/5 strength of the proximal and distal flexors and extensors of the upper and lower extremities bilaterally; sensation intact in upper and lower extremities b/l; cranial nerves II through XII grossly intact; no pronator drift; no evidence suggestive of slurred speech, dysarthria, or facial droop; Normal muscle tone. No tremors.  *** Neuro: In the setting of the patient's current mental status and associated inability to follow instructions, unable to perform full neurologic exam  at this time.  As such, assessment of strength, sensation, and cranial nerves is limited at this time. Patient noted to spontaneously move all 4 extremities. No tremors.  ***    Labs on Admission: I have personally reviewed following labs and imaging studies  CBC: Recent Labs  Lab 11/30/22 1507  WBC 7.6  NEUTROABS 5.6  HGB 13.1  HCT 42.5  MCV 92.8  PLT 224   Basic Metabolic Panel: Recent Labs  Lab 11/30/22 1507  NA 137  K 4.3  CL 104  CO2 24  GLUCOSE 130*  BUN 20  CREATININE 1.33*  CALCIUM 10.1   GFR: Estimated Creatinine Clearance: 28.3 mL/min (A) (by C-G formula based on SCr of 1.33 mg/dL (H)). Liver Function Tests: Recent Labs  Lab 11/30/22 1507  AST 23  ALT 18  ALKPHOS 68  BILITOT 0.5  PROT 6.2*  ALBUMIN 3.3*   No results for input(s): "LIPASE", "AMYLASE" in the last 168 hours. No results for input(s): "AMMONIA" in the last 168 hours. Coagulation Profile: No results for input(s): "INR", "PROTIME" in the last 168 hours. Cardiac Enzymes: No results for input(s): "CKTOTAL", "CKMB", "CKMBINDEX", "TROPONINI" in the last 168 hours. BNP (last 3 results) No results for input(s): "PROBNP" in the last 8760 hours. HbA1C: No results for input(s): "HGBA1C" in the last 72 hours. CBG: No results for input(s): "GLUCAP" in the last 168 hours. Lipid Profile: No results for input(s): "CHOL", "HDL", "LDLCALC", "TRIG", "CHOLHDL", "LDLDIRECT" in the last 72 hours. Thyroid Function Tests: No results for input(s): "TSH", "T4TOTAL", "FREET4", "T3FREE", "THYROIDAB" in the last 72 hours. Anemia Panel: No results for input(s): "VITAMINB12", "FOLATE", "FERRITIN", "TIBC", "IRON", "RETICCTPCT" in the last 72 hours. Urine analysis:    Component Value Date/Time   COLORURINE YELLOW 12/13/2008 0618   APPEARANCEUR CLOUDY (A) 12/13/2008 0618   LABSPEC 1.014 12/13/2008 0618   PHURINE 6.0 12/13/2008 0618   GLUCOSEU NEGATIVE 12/13/2008 0618   HGBUR NEGATIVE 12/13/2008 0618    BILIRUBINUR NEGATIVE 12/13/2008 0618   KETONESUR NEGATIVE 12/13/2008 0618   PROTEINUR NEGATIVE 12/13/2008 0618   UROBILINOGEN  0.2 12/13/2008 0618   NITRITE POSITIVE (A) 12/13/2008 0618   LEUKOCYTESUR SMALL (A) 12/13/2008 0618    Radiological Exams on Admission: MR LUMBAR SPINE WO CONTRAST  Result Date: 11/30/2022 CLINICAL DATA:  Low back pain, prior surgery, new symptoms left L1-L3 radiculopathy, weakness and pain with left hip flexion EXAM: MRI LUMBAR SPINE WITHOUT CONTRAST TECHNIQUE: Multiplanar, multisequence MR imaging of the lumbar spine was performed. No intravenous contrast was administered. COMPARISON:  Lumbar spine CT dated 06/09/2022, lumbar spine MRI dated 06/09/2022 FINDINGS: Limitations: Assessment of the surgical levels is limited due to susceptibility artifact from metallic fusion hardware. Segmentation:  Standard. Alignment: Grade 1 anterolisthesis of L4 on L5. Trace retrolisthesis of L1 on L2. Vertebrae: Redemonstrated postsurgical changes from L2-L3 spinal fusion. Compared to prior exam Conus medullaris and cauda equina: Conus extends to the T12-L1 disc space level. Conus and cauda equina appear normal. Paraspinal and other soft tissues: Compared to prior exam there is new T2 hyperintense signal in the left psoas muscle the L1-L2 level. (Series 6, image 13) measuring 1.3 x 1 2 cm. There may be a small locule of air in this region. Disc levels: Compared to prior exam there is likely new nonspecific fluid in the ventral epidural space at the L1 and L2 levels (series 4, image 10). There is severe spinal canal stenosis L1-L2, L3-L4 levels. Moderate spinal canal stenosis at L4-L5. There is severe left neural foraminal narrowing at L4-L5, unchanged from prior exam. There are severe multilevel facet degenerative changes, most notably at L4-L5 and L5-S1. IMPRESSION: 1. Compared to prior exam, there is new T2 hyperintense signal in the left psoas muscle at the L1-L2 level measuring 1.3 x 1.2 cm.  There may be a small locule of air in this region. Findings are concerning for an abscess. Recommend further evaluation with contrast enhanced lumbar spine MRI. 2. Compared to prior exam, there is likely new nonspecific fluid in the ventral epidural space at the L1 and L2 levels. Recommend attention on contrast-enhanced MRI of the lumbar spine to exclude the possibility for ventral epidural abscess. 3. Redemonstrated postsurgical changes from L2-L3 spinal fusion. 4. Severe spinal canal stenosis at L1-L2 and L3-L4 levels. 5. Severe left neural foraminal narrowing at L4-L5. Electronically Signed   By: Lorenza Cambridge M.D.   On: 11/30/2022 20:48   DG Hip Unilat W or Wo Pelvis 2-3 Views Left  Result Date: 11/30/2022 CLINICAL DATA:  Hip pain.  Back pain. EXAM: DG HIP (WITH OR WITHOUT PELVIS) 2-3V LEFT COMPARISON:  None Available. FINDINGS: Pelvis is intact with normal and symmetric sacroiliac joints. No acute fracture or dislocation. No aggressive osseous lesion. Visualized sacral arcuate lines are unremarkable. Unremarkable symphysis pubis. There are mild degenerative changes of bilateral hip joints without significant joint space narrowing. Osteophytosis of the superior acetabulum. No radiopaque foreign bodies. Neurostimulator device battery pack is seen overlying the right iliac wing. IMPRESSION: 1. Mild bilateral hip osteoarthritis. Electronically Signed   By: Jules Schick M.D.   On: 11/30/2022 16:12   DG Lumbar Spine Complete  Result Date: 11/30/2022 CLINICAL DATA:  Back and hip pain. EXAM: LUMBAR SPINE - COMPLETE 4+ VIEW COMPARISON:  Lumbar spine 09/09/2022 and MRI 06/09/2022 FINDINGS: Scoliosis in the lumbar spine with the apex towards the right at L1-L2. Posterior lumbar interbody fusion at L2-L3 with bilateral pedicle screws, rods and an interbody device. Surgical hardware is similar to the previous examination. Deformity along the anterior inferior endplate pf L1 appears unchanged. Stable grade 1  anterolisthesis of  L4 on L5. Extensive facet arthropathy in the lower lumbar spine. Patient has a spinal stimulator device in the thoracic spine. IMPRESSION: 1. No acute abnormality in the lumbar spine. 2. Stable postsurgical changes at L2-L3. 3. Chronic scoliosis and degenerative changes in the lumbar spine. Electronically Signed   By: Richarda Overlie M.D.   On: 11/30/2022 16:07      Assessment/Plan   Principal Problem:   Low back pain   ***            ***                ***                 ***               ***               ***               ***                ***               ***               ***               ***               ***              ***          ***  DVT prophylaxis: SCD's ***  Code Status: Full code*** Family Communication: none*** Disposition Plan: Per Rounding Team Consults called: none***;  Admission status: ***     I SPENT GREATER THAN 75 *** MINUTES IN CLINICAL CARE TIME/MEDICAL DECISION-MAKING IN COMPLETING THIS ADMISSION.      Chaney Born Eluterio Seymour DO [facility]  From 7PM - 7AM   11/30/2022, 11:22 PM   ***

## 2022-11-30 NOTE — ED Notes (Signed)
MRI to pick up patient with fully charged stimulator.

## 2022-11-30 NOTE — ED Notes (Signed)
Awaiting patient from lobby 

## 2022-11-30 NOTE — ED Provider Triage Note (Signed)
Emergency Medicine Provider Triage Evaluation Note  Lisa Maxwell , a 77 y.o. female  was evaluated in triage.  Pt complains of left leg weakness.  Patient has an extensive history of mobility issues, back surgeries, chronic weakness.  She reports that she had "dropfoot in the right side and had surgeries and was in rehab she has her house set up for him and her mobility issues.  Today her pain was worse and she complains of pain in her left hip and knee which has been progressively worsening and unrelieved despite taking a muscle relaxer and a 5-3 25 hydrocodone which is her normal pain medication.  Patient reports that her husband put her in the acorn left to take her up the stairs in her split level home and she was unable to utilize the left leg to help transfer into her wheelchair which is unusual for her..  Review of Systems  Positive: Left leg and hip pain and weakness Negative: Saddle anesthesia  Physical Exam  BP (!) 151/69 (BP Location: Left Arm)   Pulse 99   Temp 97.8 F (36.6 C) (Oral)   Resp 16   Ht 4\' 8"  (1.422 m)   Wt 70.3 kg   SpO2 96%   BMI 34.75 kg/m  Gen:   Awake, no distress   Resp:  Normal effort  MSK:   Moves extremities without difficulty  Other:    Medical Decision Making  Medically screening exam initiated at 2:53 PM.  Appropriate orders placed.  VERONIKA LORIO was informed that the remainder of the evaluation will be completed by another provider, this initial triage assessment does not replace that evaluation, and the importance of remaining in the ED until their evaluation is complete.     Arthor Captain, PA-C 11/30/22 1459

## 2022-12-01 ENCOUNTER — Observation Stay (HOSPITAL_COMMUNITY): Payer: Medicare HMO

## 2022-12-01 ENCOUNTER — Encounter (HOSPITAL_COMMUNITY): Payer: Self-pay | Admitting: Internal Medicine

## 2022-12-01 DIAGNOSIS — E785 Hyperlipidemia, unspecified: Secondary | ICD-10-CM

## 2022-12-01 DIAGNOSIS — M5442 Lumbago with sciatica, left side: Secondary | ICD-10-CM | POA: Diagnosis not present

## 2022-12-01 DIAGNOSIS — R531 Weakness: Secondary | ICD-10-CM | POA: Diagnosis not present

## 2022-12-01 DIAGNOSIS — J449 Chronic obstructive pulmonary disease, unspecified: Secondary | ICD-10-CM | POA: Diagnosis not present

## 2022-12-01 DIAGNOSIS — I482 Chronic atrial fibrillation, unspecified: Secondary | ICD-10-CM | POA: Diagnosis not present

## 2022-12-01 DIAGNOSIS — S32019A Unspecified fracture of first lumbar vertebra, initial encounter for closed fracture: Secondary | ICD-10-CM | POA: Diagnosis not present

## 2022-12-01 DIAGNOSIS — E1142 Type 2 diabetes mellitus with diabetic polyneuropathy: Secondary | ICD-10-CM | POA: Diagnosis not present

## 2022-12-01 HISTORY — DX: Hypercalcemia: E83.52

## 2022-12-01 HISTORY — DX: Hyperlipidemia, unspecified: E78.5

## 2022-12-01 LAB — COMPREHENSIVE METABOLIC PANEL
ALT: 15 U/L (ref 0–44)
AST: 22 U/L (ref 15–41)
Albumin: 2.6 g/dL — ABNORMAL LOW (ref 3.5–5.0)
Alkaline Phosphatase: 51 U/L (ref 38–126)
Anion gap: 7 (ref 5–15)
BUN: 15 mg/dL (ref 8–23)
CO2: 24 mmol/L (ref 22–32)
Calcium: 9.5 mg/dL (ref 8.9–10.3)
Chloride: 105 mmol/L (ref 98–111)
Creatinine, Ser: 1.15 mg/dL — ABNORMAL HIGH (ref 0.44–1.00)
GFR, Estimated: 49 mL/min — ABNORMAL LOW (ref >60)
Glucose, Bld: 119 mg/dL — ABNORMAL HIGH (ref 70–99)
Potassium: 4.2 mmol/L (ref 3.5–5.1)
Sodium: 136 mmol/L (ref 135–145)
Total Bilirubin: 0.5 mg/dL (ref 0.3–1.2)
Total Protein: 4.9 g/dL — ABNORMAL LOW (ref 6.5–8.1)

## 2022-12-01 LAB — CBC WITH DIFFERENTIAL/PLATELET
Abs Immature Granulocytes: 0.02 10*3/uL (ref 0.00–0.07)
Basophils Absolute: 0 10*3/uL (ref 0.0–0.1)
Basophils Relative: 0 %
Eosinophils Absolute: 0.1 10*3/uL (ref 0.0–0.5)
Eosinophils Relative: 2 %
HCT: 35.3 % — ABNORMAL LOW (ref 36.0–46.0)
Hemoglobin: 11.1 g/dL — ABNORMAL LOW (ref 12.0–15.0)
Immature Granulocytes: 0 %
Lymphocytes Relative: 21 %
Lymphs Abs: 1.3 10*3/uL (ref 0.7–4.0)
MCH: 28.6 pg (ref 26.0–34.0)
MCHC: 31.4 g/dL (ref 30.0–36.0)
MCV: 91 fL (ref 80.0–100.0)
Monocytes Absolute: 0.6 10*3/uL (ref 0.1–1.0)
Monocytes Relative: 10 %
Neutro Abs: 4.1 10*3/uL (ref 1.7–7.7)
Neutrophils Relative %: 67 %
Platelets: 182 10*3/uL (ref 150–400)
RBC: 3.88 MIL/uL (ref 3.87–5.11)
RDW: 15.7 % — ABNORMAL HIGH (ref 11.5–15.5)
WBC: 6.1 10*3/uL (ref 4.0–10.5)
nRBC: 0 % (ref 0.0–0.2)

## 2022-12-01 LAB — GLUCOSE, CAPILLARY
Glucose-Capillary: 103 mg/dL — ABNORMAL HIGH (ref 70–99)
Glucose-Capillary: 121 mg/dL — ABNORMAL HIGH (ref 70–99)
Glucose-Capillary: 194 mg/dL — ABNORMAL HIGH (ref 70–99)
Glucose-Capillary: 126 mg/dL — ABNORMAL HIGH (ref 70–99)
Glucose-Capillary: 163 mg/dL — ABNORMAL HIGH (ref 70–99)

## 2022-12-01 LAB — MAGNESIUM: Magnesium: 1.6 mg/dL — ABNORMAL LOW (ref 1.7–2.4)

## 2022-12-01 LAB — PHOSPHORUS: Phosphorus: 3 mg/dL (ref 2.5–4.6)

## 2022-12-01 MED ORDER — TAMSULOSIN HCL 0.4 MG PO CAPS
0.4000 mg | ORAL_CAPSULE | Freq: Two times a day (BID) | ORAL | Status: DC
  Administered 2022-12-01 – 2022-12-07 (×12): 0.4 mg via ORAL
  Filled 2022-12-01 (×12): qty 1

## 2022-12-01 MED ORDER — TRIMETHOPRIM 100 MG PO TABS
100.0000 mg | ORAL_TABLET | Freq: Every day | ORAL | Status: DC
  Administered 2022-12-01 – 2022-12-07 (×7): 100 mg via ORAL
  Filled 2022-12-01 (×7): qty 1

## 2022-12-01 MED ORDER — PRAVASTATIN SODIUM 40 MG PO TABS
40.0000 mg | ORAL_TABLET | Freq: Every day | ORAL | Status: DC
  Administered 2022-12-01 – 2022-12-06 (×6): 40 mg via ORAL
  Filled 2022-12-01 (×6): qty 1

## 2022-12-01 MED ORDER — GADOBUTROL 1 MMOL/ML IV SOLN
6.0000 mL | Freq: Once | INTRAVENOUS | Status: AC | PRN
  Administered 2022-12-01: 6 mL via INTRAVENOUS

## 2022-12-01 MED ORDER — MAGNESIUM SULFATE 2 GM/50ML IV SOLN
2.0000 g | Freq: Once | INTRAVENOUS | Status: AC
  Administered 2022-12-01: 2 g via INTRAVENOUS
  Filled 2022-12-01: qty 50

## 2022-12-01 MED ORDER — FLUTICASONE FUROATE-VILANTEROL 100-25 MCG/ACT IN AEPB
1.0000 | INHALATION_SPRAY | Freq: Every day | RESPIRATORY_TRACT | Status: DC
  Administered 2022-12-01 – 2022-12-07 (×7): 1 via RESPIRATORY_TRACT
  Filled 2022-12-01: qty 28

## 2022-12-01 MED ORDER — GABAPENTIN 300 MG PO CAPS
600.0000 mg | ORAL_CAPSULE | Freq: Every day | ORAL | Status: DC
  Administered 2022-12-01 – 2022-12-06 (×5): 600 mg via ORAL
  Filled 2022-12-01 (×5): qty 2

## 2022-12-01 MED ORDER — FLUTICASONE FUROATE-VILANTEROL 100-25 MCG/ACT IN AEPB: 1.0000 | INHALATION_SPRAY | Freq: Every day | RESPIRATORY_TRACT | Status: DC

## 2022-12-01 MED ORDER — BETHANECHOL CHLORIDE 10 MG PO TABS
50.0000 mg | ORAL_TABLET | Freq: Three times a day (TID) | ORAL | Status: DC
  Administered 2022-12-01 – 2022-12-07 (×18): 50 mg via ORAL
  Filled 2022-12-01 (×19): qty 5

## 2022-12-01 MED ORDER — DOCUSATE SODIUM 100 MG PO CAPS
100.0000 mg | ORAL_CAPSULE | Freq: Every day | ORAL | Status: DC
  Administered 2022-12-01 – 2022-12-07 (×7): 100 mg via ORAL
  Filled 2022-12-01 (×7): qty 1

## 2022-12-01 MED ORDER — CHLORHEXIDINE GLUCONATE CLOTH 2 % EX PADS
6.0000 | MEDICATED_PAD | Freq: Every day | CUTANEOUS | Status: DC
  Administered 2022-12-01 – 2022-12-07 (×7): 6 via TOPICAL

## 2022-12-01 MED ORDER — FLUCONAZOLE 200 MG PO TABS
200.0000 mg | ORAL_TABLET | Freq: Once | ORAL | Status: AC
  Administered 2022-12-01: 200 mg via ORAL
  Filled 2022-12-01: qty 1

## 2022-12-01 MED ORDER — METHOCARBAMOL 500 MG PO TABS
250.0000 mg | ORAL_TABLET | Freq: Three times a day (TID) | ORAL | Status: DC
  Administered 2022-12-01 – 2022-12-07 (×19): 250 mg via ORAL
  Filled 2022-12-01 (×19): qty 1

## 2022-12-01 MED ORDER — LATANOPROST 0.005 % OP SOLN
1.0000 [drp] | Freq: Every day | OPHTHALMIC | Status: DC
  Administered 2022-12-01 – 2022-12-06 (×6): 1 [drp] via OPHTHALMIC
  Filled 2022-12-01: qty 2.5

## 2022-12-01 MED ORDER — ALBUTEROL SULFATE (2.5 MG/3ML) 0.083% IN NEBU: 2.5000 mg | INHALATION_SOLUTION | RESPIRATORY_TRACT | Status: DC | PRN

## 2022-12-01 MED ORDER — INSULIN ASPART 100 UNIT/ML IJ SOLN
0.0000 [IU] | Freq: Three times a day (TID) | INTRAMUSCULAR | Status: DC
  Administered 2022-12-01: 2 [IU] via SUBCUTANEOUS
  Administered 2022-12-01 – 2022-12-02 (×2): 1 [IU] via SUBCUTANEOUS
  Administered 2022-12-02 – 2022-12-03 (×2): 2 [IU] via SUBCUTANEOUS
  Administered 2022-12-03: 1 [IU] via SUBCUTANEOUS
  Administered 2022-12-03: 2 [IU] via SUBCUTANEOUS
  Administered 2022-12-04: 1 [IU] via SUBCUTANEOUS
  Administered 2022-12-04 (×2): 2 [IU] via SUBCUTANEOUS
  Administered 2022-12-05: 1 [IU] via SUBCUTANEOUS
  Administered 2022-12-05 (×2): 3 [IU] via SUBCUTANEOUS
  Administered 2022-12-06: 1 [IU] via SUBCUTANEOUS
  Administered 2022-12-06: 3 [IU] via SUBCUTANEOUS
  Administered 2022-12-07: 1 [IU] via SUBCUTANEOUS
  Administered 2022-12-07: 3 [IU] via SUBCUTANEOUS

## 2022-12-01 MED ORDER — APIXABAN 5 MG PO TABS
5.0000 mg | ORAL_TABLET | Freq: Two times a day (BID) | ORAL | Status: DC
  Administered 2022-12-01 – 2022-12-07 (×13): 5 mg via ORAL
  Filled 2022-12-01 (×13): qty 1

## 2022-12-01 MED ORDER — PANTOPRAZOLE SODIUM 40 MG PO TBEC
40.0000 mg | DELAYED_RELEASE_TABLET | Freq: Every day | ORAL | Status: DC
  Administered 2022-12-01 – 2022-12-07 (×7): 40 mg via ORAL
  Filled 2022-12-01 (×7): qty 1

## 2022-12-01 MED ORDER — NYSTATIN 100000 UNIT/GM EX CREA
TOPICAL_CREAM | Freq: Two times a day (BID) | CUTANEOUS | Status: DC
  Administered 2022-12-01 – 2022-12-02 (×2): 1 via TOPICAL
  Filled 2022-12-01: qty 30

## 2022-12-01 NOTE — Evaluation (Signed)
Occupational Therapy Evaluation Patient Details Name: Lisa Maxwell MRN: [id number] DOB: 1945-12-16 Today's Date: 12/01/2022   History of Present Illness 77 yo female admitted with L side lower back pain. MRI (+) edema related to severity of DDD followed by [facility] outpatient PMH AD, cerebrovascular disease, a-fib, COPD, chronic pain syndrome, renal failure, DDD, DM, HTN, bil TKA, PVD, spinal stenosis lumbar spine with neurogenic claudication, cauda equina compression CAD CKD   Clinical Impression   PT admitted with pain secondary to edema with severe DDD. Pt currently with functional limitiations due to the deficits listed below (see OT problem list). Pt was at home prior to admission with spouse (A) for bathing and transfers. Pt currently requires two person (A). Pt noted to have skin break down and irritation on her chest. Pt with very wet breath rattle sounds on arrival with some improvement with mobility. Pt deeper cough but does not produce any secretions. Pt very limited in transfers at this time.  Pt will benefit from skilled OT to increase their independence and safety with adls and balance to allow discharge skilled inpatient follow up therapy, <3 hours/day. .        If plan is discharge home, recommend the following: Two people to help with walking and/or transfers;Two people to help with bathing/dressing/bathroom    Functional Status Assessment  Patient has had a recent decline in their functional status and demonstrates the ability to make significant improvements in function in a reasonable and predictable amount of time.  Equipment Recommendations  None recommended by OT    Recommendations for Other Services       Precautions / Restrictions Precautions Precautions: Back Precaution Comments: foley Restrictions Weight Bearing Restrictions: No      Mobility Bed Mobility Overal bed mobility: Needs Assistance Bed Mobility: Rolling, Supine to Sit, Sit to  Supine Rolling: Max assist   Supine to sit: Max assist Sit to supine: Max assist, +2 for physical assistance, +2 for safety/equipment   General bed mobility comments: pt requires use of pad to help slide to EOB. pt once EOB falling to R side due to L side pain. pt with R lateral lean and able to sustain static sitting with min (A)> pt progressed to [facility] and then returning to bed with total (A) for bil LE. pt total +2 total (A ) to scoot to the [facility] and position. pt reports pain is always present even laying    Transfers Overall transfer level: Needs assistance Equipment used: Rolling walker (2 wheels) Transfers: Sit to/from Stand, Bed to chair/wheelchair/BSC Sit to Stand: +2 physical assistance, Mod assist Stand pivot transfers: +2 physical assistance, Mod assist, From elevated surface         General transfer comment: pt needs (A) to shift weight and progress to [facility] on R side. pt with better transfer toward R side compared to L side      Balance Overall balance assessment: Needs assistance Sitting-balance support: Bilateral upper extremity supported, Feet supported Sitting balance-Leahy Scale: Poor     Standing balance support: Bilateral upper extremity supported, During functional activity, Reliant on assistive device for balance Standing balance-Leahy Scale: Poor                             ADL either performed or assessed with clinical judgement   ADL Overall ADL's : Needs assistance/impaired  Lower Body Dressing: Total assistance   Toilet Transfer: +2 for physical assistance;Moderate assistance;Stand-pivot;BSC/3in1;Rolling walker (2 wheels)     Toileting - Clothing Manipulation Details (indicate cue type and reason): does not have void only urge       General ADL Comments: pt motivated on arrival to get up to the bathroom. pt was unabel to progress past EOB initially so BSC placed next to patient. pt stand pivot to [facility] without  void. pt returned to supine with extensive help     Vision Baseline Vision/History: 1 Wears glasses Ability to See in Adequate Light: 0 Adequate Vision Assessment?: No apparent visual deficits     Perception         Praxis         Pertinent Vitals/Pain Pain Assessment Pain Assessment: Faces Faces Pain Scale: Hurts whole lot Pain Location: L LE Pain Descriptors / Indicators: Burning, Grimacing, Guarding, Shooting Pain Intervention(s): Monitored during session, Premedicated before session, Repositioned, Limited activity within patient's tolerance     Extremity/Trunk Assessment Upper Extremity Assessment Upper Extremity Assessment: Generalized weakness   Lower Extremity Assessment Lower Extremity Assessment: Generalized weakness;LLE deficits/detail LLE Deficits / Details: reports shooting pain down the leg   Cervical / Trunk Assessment Cervical / Trunk Assessment: Kyphotic   Communication Communication Communication: Hearing impairment   Cognition Arousal: Alert Behavior During Therapy: WFL for tasks assessed/performed Overall Cognitive Status: Within Functional Limits for tasks assessed                                       General Comments  noted to have skin break down on abdomen and under the breast. pt reports using topical based lotions prescribed without much relief. Ot reaching out to RN staff for possible WOC for interdry    Exercises     Shoulder Instructions      Home Living Family/patient expects to be discharged to:: Private residence Living Arrangements: Spouse/significant other Available Help at Discharge: Family;Available 24 hours/day Type of Home: House Home Access: Ramped entrance     Home Layout: Two level;Bed/bath upstairs;Able to live on main level with bedroom/bathroom Alternate Level Stairs-Number of Steps: 4 steps - Has stair lift Alternate Level Stairs-Rails: Right Bathroom Shower/Tub: Walk-in shower         Home  Equipment: Agricultural consultant (2 wheels);Rollator (4 wheels);BSC/3in1;Shower seat - built in;Grab bars - tub/shower;Wheelchair - manual;Shower seat   Additional Comments: has a stair lift      Prior Functioning/Environment Prior Level of Function : Needs assist             Mobility Comments: Pt amb laps around house with rollator modified independent. ADLs Comments: reports spouse helps        OT Problem List: Decreased strength;Impaired balance (sitting and/or standing);Decreased activity tolerance;Decreased safety awareness;Decreased knowledge of use of DME or AE;Decreased knowledge of precautions;Obesity;Pain      OT Treatment/Interventions: Self-care/ADL training;Therapeutic exercise;DME and/or AE instruction;Manual therapy;Modalities;Therapeutic activities;Balance training;Patient/family education;Energy conservation;Neuromuscular education    OT Goals(Current goals can be found in the care plan section) Acute Rehab OT Goals Patient Stated Goal: to be abel to get up without pain OT Goal Formulation: With patient Time For Goal Achievement: 12/15/22 Potential to Achieve Goals: Good  OT Frequency: Min 1X/week    Co-evaluation              AM-PAC OT "6 Clicks" Daily Activity     Outcome  Measure Help from another person eating meals?: None Help from another person taking care of personal grooming?: A Little Help from another person toileting, which includes using toliet, bedpan, or urinal?: A Lot Help from another person bathing (including washing, rinsing, drying)?: A Lot Help from another person to put on and taking off regular upper body clothing?: A Little Help from another person to put on and taking off regular lower body clothing?: A Lot 6 Click Score: 16   End of Session Equipment Utilized During Treatment: Rolling walker (2 wheels);Gait belt Nurse Communication: Mobility status;Precautions  Activity Tolerance: Patient tolerated treatment well Patient left: in  bed;with call bell/phone within reach;with bed alarm set  OT Visit Diagnosis: Muscle weakness (generalized) (M62.81);Pain Pain - Right/Left: Left Pain - part of body: Leg                Time: 3016-0109 OT Time Calculation (min): 19 min Charges:  OT General Charges $OT Visit: 1 Visit OT Evaluation $OT Eval Moderate Complexity: 1 Mod   Brynn, OTR/L  Acute Rehabilitation Services Office: [phone] .   Mateo Flow 12/01/2022, 1:53 PM

## 2022-12-01 NOTE — Progress Notes (Signed)
PT Cancellation Note  Patient Details Name: Lisa Maxwell MRN: [id number] DOB: 02/16/1946   Cancelled Treatment:    Reason Eval/Treat Not Completed: Other (comment). Attempted x 3. Pt for foley change, down for MRI, and now with fatigue and pain. Will try again in the AM.    Angelina Ok [facility] 12/01/2022, 3:22 PM Skip Mayer PT Acute Rehabilitation Services Office [phone]

## 2022-12-01 NOTE — Progress Notes (Signed)
[facility]                                                                               Lisa Maxwell, is a 77 y.o. female, DOB - 29-Jan-1946, NWG:[id number] Admit date - 11/30/2022    Outpatient Primary MD for the patient is Paulina Fusi, MD  LOS - 0  days    Brief summary    77 y.o. female with medical history significant for severe degenerative disc disease involving the lumbar spine, chronic atrial fibrillation chronically anticoagulated on Eliquis, COPD, type 2 diabetes mellitus complicated by diabetic peripheral polyneuropathy, hyperlipidemia, stage IIIb CKD associated baseline creatinine range 1.2-1.7, who is admitted to [facility] on 11/30/2022 with low back pain after presenting from home to [facility] ED complaining of low back pain.  No recent preceding trauma.  In the setting of her known history of severe degenerative cyst disease, she follows with Dr. Conchita Paris as her outpatient neurosurgeon, and is reported to be a nonsurgical candidate as a relates to her degenerative disc disease.   Denies any acute focal weakness numbness or paresthesias, but reports that limitations relating to her use of the left leg due to suboptimal pain control and  in spite of good compliance with her outpatient pain control regimen with Norco 5/325 mg p.o. every 6 hours on a scheduled basis in addition to scheduled Robaxin 3 times daily as well as scheduled gabapentin.   She underwent MRI lumbar spine without contrast showing  new T2 hyperintense signal in the left psoas muscle at the L1-L2 level measuring 1.3 x 1.2 cm. There may be a small locule of air in this region. Findings are concerning for an abscess.  Mri of the lumbar spine with contrast ordered for further evaluation.   EDP discussed case with Dr Dutch Quint, who suggested there is no evidence of abscess, and no indication of antibiotics at this time. Dr Conchita Paris will be notified by Dr Dutch Quint.   Assessment & Plan     Assessment and Plan:   Acute on chronic left lower back pain associated with some radiculopathy Pain not well-controlled with Norco, gabapentin and Robaxin. MRI without contrast done in Dr. Dutch Quint with neurosurgery is not convinced that patient has any abscess or infection. MRI of the lumbar spine with contrast ordered and pending Patient's neurosurgeon Dr. Conchita Paris will be notified Conservative management with pain medication, therapy evaluations.  Mild hypercalcemia Resolved with IV fluids  Hypomagnesemia Replaced   Chronic atrial fibrillation Not on any kind of rate limiting agents at this time. Last echocardiogram from November 2023 showed preserved left ventricular ejection fraction of 60 to 65% without any regional wall abnormalities. Continue with Eliquis for anticoagulation.   COPD No wheezing heard, patient is on room air Resume home bronchodilators as needed.    Type 2 diabetes mellitus Continue with sliding scale insulin.  Last hemoglobin A1c around 6.1   Hyperlipidemia Resume statin.  Estimated body mass index is 36.23 kg/m as calculated from the following:   Height as of this encounter: 4\' 8"  (1.422 m).   Weight as of this encounter: 73.3 kg.  Code Status: full code.  DVT Prophylaxis:  SCDs  Start: 11/30/22 2317 apixaban (ELIQUIS) tablet 5 mg   Level of Care: Level of care: Progressive Family Communication: none at bedside.   Disposition Plan:     Remains inpatient appropriate:  pending evaluation by NS.   Procedures:  MRI lumbar spine with contrast ordered.   Consultants:   Neurosurgery   Antimicrobials:   Anti-infectives (From admission, onward)    None        Medications  Scheduled Meds:  apixaban  5 mg Oral BID   docusate sodium  100 mg Oral Daily   fluticasone furoate-vilanterol  1 puff Inhalation Daily   gabapentin  600 mg Oral QHS   insulin aspart  0-9 Units Subcutaneous TID WC   latanoprost  1 drop Both Eyes QHS    lidocaine  1 patch Transdermal Q24H   methocarbamol  250 mg Oral TID   pravastatin  40 mg Oral q1800   Continuous Infusions: PRN Meds:.acetaminophen **OR** acetaminophen, albuterol, HYDROmorphone (DILAUDID) injection, melatonin, naLOXone (NARCAN)  injection, ondansetron (ZOFRAN) IV, oxyCODONE    Subjective:   Lisa Maxwell was seen and examined today.  Reports low back pain.   Objective:   Vitals:   12/01/22 0402 12/01/22 0500 12/01/22 0801 12/01/22 0808  BP: 111/61  119/71   Pulse: (!) 101  99   Resp: 18  17   Temp: 98.3 F (36.8 C)  98.4 F (36.9 C)   TempSrc: Oral  Oral   SpO2: 94%  90% 93%  Weight:  73.3 kg    Height:        Intake/Output Summary (Last 24 hours) at 12/01/2022 1231 Last data filed at 12/01/2022 1610 Gross per 24 hour  Intake 475.58 ml  Output 575 ml  Net -99.42 ml   Filed Weights   11/30/22 1416 12/01/22 0044 12/01/22 0500  Weight: 70.3 kg 70 kg 73.3 kg     Exam General exam: Appears calm and comfortable  Respiratory system: Clear to auscultation. Respiratory effort normal. Cardiovascular system: S1 & S2 heard, RRR. No JVD Gastrointestinal system: Abdomen is nondistended, soft and nontender.  Central nervous system: Alert and oriented. No focal neurological deficits. Extremities: no pedal edema.  Skin: No rashes,  Psychiatry: Judgement and insight appear normal. Mood & affect appropriate.    Data Reviewed:  I have personally reviewed following labs and imaging studies   CBC Lab Results  Component Value Date   WBC 6.1 12/01/2022   RBC 3.88 12/01/2022   HGB 11.1 (L) 12/01/2022   HCT 35.3 (L) 12/01/2022   MCV 91.0 12/01/2022   MCH 28.6 12/01/2022   PLT 182 12/01/2022   MCHC 31.4 12/01/2022   RDW 15.7 (H) 12/01/2022   LYMPHSABS 1.3 12/01/2022   MONOABS 0.6 12/01/2022   EOSABS 0.1 12/01/2022   BASOSABS 0.0 12/01/2022     Last metabolic panel Lab Results  Component Value Date   NA 136 12/01/2022   K 4.2 12/01/2022   CL 105  12/01/2022   CO2 24 12/01/2022   BUN 15 12/01/2022   CREATININE 1.15 (H) 12/01/2022   GLUCOSE 119 (H) 12/01/2022   GFRNONAA 49 (L) 12/01/2022   GFRAA 35 (L) 07/04/2019   CALCIUM 9.5 12/01/2022   PHOS 3.0 12/01/2022   PROT 4.9 (L) 12/01/2022   ALBUMIN 2.6 (L) 12/01/2022   BILITOT 0.5 12/01/2022   ALKPHOS 51 12/01/2022   AST 22 12/01/2022   ALT 15 12/01/2022   ANIONGAP 7 12/01/2022    CBG (last 3)  Recent Labs  12/01/22 0612 12/01/22 0806  GLUCAP 103* 121*      Coagulation Profile: No results for input(s): "INR", "PROTIME" in the last 168 hours.   Radiology Studies: MR LUMBAR SPINE WO CONTRAST  Result Date: 11/30/2022 CLINICAL DATA:  Low back pain, prior surgery, new symptoms left L1-L3 radiculopathy, weakness and pain with left hip flexion EXAM: MRI LUMBAR SPINE WITHOUT CONTRAST TECHNIQUE: Multiplanar, multisequence MR imaging of the lumbar spine was performed. No intravenous contrast was administered. COMPARISON:  Lumbar spine CT dated 06/09/2022, lumbar spine MRI dated 06/09/2022 FINDINGS: Limitations: Assessment of the surgical levels is limited due to susceptibility artifact from metallic fusion hardware. Segmentation:  Standard. Alignment: Grade 1 anterolisthesis of L4 on L5. Trace retrolisthesis of L1 on L2. Vertebrae: Redemonstrated postsurgical changes from L2-L3 spinal fusion. Compared to prior exam Conus medullaris and cauda equina: Conus extends to the T12-L1 disc space level. Conus and cauda equina appear normal. Paraspinal and other soft tissues: Compared to prior exam there is new T2 hyperintense signal in the left psoas muscle the L1-L2 level. (Series 6, image 13) measuring 1.3 x 1 2 cm. There may be a small locule of air in this region. Disc levels: Compared to prior exam there is likely new nonspecific fluid in the ventral epidural space at the L1 and L2 levels (series 4, image 10). There is severe spinal canal stenosis L1-L2, L3-L4 levels. Moderate spinal canal  stenosis at L4-L5. There is severe left neural foraminal narrowing at L4-L5, unchanged from prior exam. There are severe multilevel facet degenerative changes, most notably at L4-L5 and L5-S1. IMPRESSION: 1. Compared to prior exam, there is new T2 hyperintense signal in the left psoas muscle at the L1-L2 level measuring 1.3 x 1.2 cm. There may be a small locule of air in this region. Findings are concerning for an abscess. Recommend further evaluation with contrast enhanced lumbar spine MRI. 2. Compared to prior exam, there is likely new nonspecific fluid in the ventral epidural space at the L1 and L2 levels. Recommend attention on contrast-enhanced MRI of the lumbar spine to exclude the possibility for ventral epidural abscess. 3. Redemonstrated postsurgical changes from L2-L3 spinal fusion. 4. Severe spinal canal stenosis at L1-L2 and L3-L4 levels. 5. Severe left neural foraminal narrowing at L4-L5. Electronically Signed   By: Lorenza Cambridge M.D.   On: 11/30/2022 20:48   DG Hip Unilat W or Wo Pelvis 2-3 Views Left  Result Date: 11/30/2022 CLINICAL DATA:  Hip pain.  Back pain. EXAM: DG HIP (WITH OR WITHOUT PELVIS) 2-3V LEFT COMPARISON:  None Available. FINDINGS: Pelvis is intact with normal and symmetric sacroiliac joints. No acute fracture or dislocation. No aggressive osseous lesion. Visualized sacral arcuate lines are unremarkable. Unremarkable symphysis pubis. There are mild degenerative changes of bilateral hip joints without significant joint space narrowing. Osteophytosis of the superior acetabulum. No radiopaque foreign bodies. Neurostimulator device battery pack is seen overlying the right iliac wing. IMPRESSION: 1. Mild bilateral hip osteoarthritis. Electronically Signed   By: Jules Schick M.D.   On: 11/30/2022 16:12   DG Lumbar Spine Complete  Result Date: 11/30/2022 CLINICAL DATA:  Back and hip pain. EXAM: LUMBAR SPINE - COMPLETE 4+ VIEW COMPARISON:  Lumbar spine 09/09/2022 and MRI 06/09/2022  FINDINGS: Scoliosis in the lumbar spine with the apex towards the right at L1-L2. Posterior lumbar interbody fusion at L2-L3 with bilateral pedicle screws, rods and an interbody device. Surgical hardware is similar to the previous examination. Deformity along the anterior inferior endplate pf L1 appears  unchanged. Stable grade 1 anterolisthesis of L4 on L5. Extensive facet arthropathy in the lower lumbar spine. Patient has a spinal stimulator device in the thoracic spine. IMPRESSION: 1. No acute abnormality in the lumbar spine. 2. Stable postsurgical changes at L2-L3. 3. Chronic scoliosis and degenerative changes in the lumbar spine. Electronically Signed   By: Richarda Overlie M.D.   On: 11/30/2022 16:07       Kathlen Mody M.D. [facility] 12/01/2022, 12:31 PM  Available via Epic secure chat 7am-7pm After 7 pm, please refer to night coverage provider listed on amion.

## 2022-12-01 NOTE — Plan of Care (Signed)
  Problem: Education: Goal: Knowledge of General Education information will improve Description Including pain rating scale, medication(s)/side effects and non-pharmacologic comfort measures Outcome: Progressing   

## 2022-12-01 NOTE — Progress Notes (Signed)
Admitted pt from the ED via stretcher. Pt alert and oriented x 4, complaining of lower back pain. She can barely lift her left leg up due to pain but she can lift her right leg up. She denies any numbness on either side of her leg.   Pt came in with Foley catheter in place without drain bag. She states that her Urologist places it once a month but cannot remember when was it recently changed. Dr. Antionette Char notified, continued Foley catheter, connected to drain bag. Foley and Peri care done.  Small bruises noted on both UE, redness and irritation noted on abdominal skin folds extending to peri area. Pt states that she has been treating it with her Nystatin powder but does not work. Skin assessment witnessed by Gwendel Hanson.  Call bell placed within reach. Safety precautions observed.

## 2022-12-02 DIAGNOSIS — M48061 Spinal stenosis, lumbar region without neurogenic claudication: Secondary | ICD-10-CM | POA: Diagnosis not present

## 2022-12-02 DIAGNOSIS — M5442 Lumbago with sciatica, left side: Secondary | ICD-10-CM | POA: Diagnosis not present

## 2022-12-02 LAB — GLUCOSE, CAPILLARY
Glucose-Capillary: 107 mg/dL — ABNORMAL HIGH (ref 70–99)
Glucose-Capillary: 109 mg/dL — ABNORMAL HIGH (ref 70–99)
Glucose-Capillary: 111 mg/dL — ABNORMAL HIGH (ref 70–99)
Glucose-Capillary: 125 mg/dL — ABNORMAL HIGH (ref 70–99)
Glucose-Capillary: 136 mg/dL — ABNORMAL HIGH (ref 70–99)
Glucose-Capillary: 164 mg/dL — ABNORMAL HIGH (ref 70–99)
Glucose-Capillary: 188 mg/dL — ABNORMAL HIGH (ref 70–99)

## 2022-12-02 MED ORDER — MAGNESIUM GLUCONATE 500 MG PO TABS
500.0000 mg | ORAL_TABLET | Freq: Two times a day (BID) | ORAL | Status: DC
  Administered 2022-12-02 – 2022-12-07 (×10): 500 mg via ORAL
  Filled 2022-12-02 (×11): qty 1

## 2022-12-02 NOTE — Consult Note (Signed)
Chief Complaint   Chief Complaint  Patient presents with   Back Pain    History of Present Illness  Lisa Maxwell is a 77 y.o. female, patient of mine for back and leg pain >99yr s/p L2-3 fusion.  She has known adjacent level (L1) fracture with associated L1-2 disc disease.  Patient has significant medical comorbidities.  She is admitted to the medicine service for exacerbation of back and left-sided leg pain.  Symptoms started rather acutely over the weekend.  She describes pain radiating through the lower back and into the lateral aspect of the left leg down to the knee.  No new leg or foot paresthesias.  She reports subjective weakness of the legs and difficulty walking due to pain.    Past Medical History   Past Medical History:  Diagnosis Date   CAD (coronary artery disease) 11/04/2016   Cancer (HCC)    skin cancer   Cauda equina compression (HCC) 08/28/2021   Cerebrovascular disease    Chronic atrial fibrillation (HCC) 09/28/2018   Chronic bilateral low back pain without sciatica 03/08/2018   Chronic kidney disease    Chronic obstructive lung disease (HCC) 09/28/2018   Chronic pain syndrome 03/08/2018   Chronic renal failure 09/28/2018   Chronic, continuous use of opioids 03/04/2021   Last Assessment & Plan:  Formatting of this note might be different from the original. Patient and I have discussed the hazardous effects of continued opiate pain medication usage. Risks and benefits of above medications including but not limited to possibility of respiratory depression, sedation, and even death were discussed with the patient who expressed an understanding.  Patient did not displ   CKD (chronic kidney disease) stage 3, GFR 30-59 ml/min (HCC)    Class 1 obesity due to excess calories with serious comorbidity and body mass index (BMI) of 34.0 to 34.9 in adult 05/20/2021   Last Assessment & Plan: Formatting of this note might be different from the original. Patient educated about the  detrimental effects of weight as it specifically pertains to pain management and overall health.  Patient's BMI 34.08 Encouraged healthy eating habits and routine low-impact cardiovascular exercises as tolerated.   Comprehensive diabetic foot examination, type 2 DM, encounter for (HCC) 04/29/2021   DDD (degenerative disc disease), lumbar 11/17/2018   Last Assessment & Plan:  Formatting of this note might be different from the original. See spinal stenosis plan   Diabetes (HCC)    Diabetes mellitus due to underlying condition with unspecified complications (HCC) 11/04/2016   Dyshidrotic eczema 04/29/2021   Dysrhythmia    afib   Essential hypertension 11/04/2016   Facet arthritis of lumbar region 03/08/2018   Facet joint disease 11/17/2018   Last Assessment & Plan:  Formatting of this note might be different from the original. See spinal stenosis plan   Foraminal stenosis of lumbar region 03/08/2018   Heart murmur    History of knee replacement, total, bilateral 11/17/2018   Last Assessment & Plan:  Formatting of this note might be different from the original. Have recommended to her several times if any further concerns in regards to the pain status post arthroplasty, she is to follow up with her surgeon.   History of pulmonary embolism 11/28/2021   Hypertension    Hypertensive disorder 09/28/2018   Increased body mass index 09/28/2018   Lumbar radiculopathy 03/08/2018   Lumbar spondylosis 03/20/2019   Mixed dyslipidemia 11/04/2016   Morbid obesity (HCC) 11/04/2016   Muscle pain 04/29/2020  Osteoarthritis of left knee    PAF (paroxysmal atrial fibrillation) (HCC) 04/30/2015   Pain management contract agreement 04/13/2019   Last Assessment & Plan:  Formatting of this note might be different from the original. Contract updated today.  UDS completed.  [place] controlled substance registry reviewed and is consistent with her regimen.   Peripheral vascular disease (HCC) 09/28/2018    Preop cardiovascular exam 05/01/2019   Pressure injury of skin 11/21/2021   Pulmonary emboli [facility])    Pulmonary embolism (HCC) 11/21/2021   Renal insufficiency 04/30/2015   S/P insertion of spinal cord stimulator 07/13/2019   Last Assessment & Plan:  Formatting of this note might be different from the original. About 5 months status post insertion of spinal cord stimulator with excellent relief.  She continues to keep close follow-up with her representative and has been working to appropriately find best programs to get her adequate pain relief.   Sleep apnea    Smoker 09/28/2018   Spinal stenosis of lumbar region 03/20/2019   Spinal stenosis of lumbar region with neurogenic claudication 03/08/2018   Last Assessment & Plan:  Formatting of this note is different from the original. 77 year old female with chronic low back pain and bilateral, right greater than left, L4-5 radicular pain, and claudication. She has substantial amount of multifactorial degenerative thoracic and lumbar spine pathology.  After being deemed an inappropriate open surgical candidate, and failing to respond to multiple in   Spondylolisthesis 03/20/2019   Spondylosis of lumbosacral region without myelopathy or radiculopathy 01/30/2019   Last Assessment & Plan:  Formatting of this note might be different from the original. See spinal stenosis plan   Tinea unguium 02/03/2022   Type 2 diabetes mellitus (HCC) 09/28/2018    Past Surgical History   Past Surgical History:  Procedure Laterality Date   ABDOMINAL HYSTERECTOMY     BACK SURGERY     BIOPSY  06/18/2022   Procedure: BIOPSY;  Surgeon: Lemar Lofty., MD;  Location: [facility] ENDOSCOPY;  Service: Gastroenterology;;   ESOPHAGOGASTRODUODENOSCOPY (EGD) WITH PROPOFOL N/A 06/18/2022   Procedure: ESOPHAGOGASTRODUODENOSCOPY (EGD) WITH PROPOFOL;  Surgeon: Lemar Lofty., MD;  Location: [facility] ENDOSCOPY;  Service: Gastroenterology;  Laterality: N/A;   EYE SURGERY      bilateral cataracts   IR ANGIOGRAM PULMONARY BILATERAL SELECTIVE  11/21/2021   IR ANGIOGRAM SELECTIVE EACH ADDITIONAL VESSEL  11/21/2021   IR ANGIOGRAM SELECTIVE EACH ADDITIONAL VESSEL  11/21/2021   IR THROMBECT PRIM MECH INIT (INCLU) MOD SED  11/21/2021   IR US GUIDE VASC ACCESS RIGHT  11/21/2021   JOINT REPLACEMENT Bilateral    knees   RADIOLOGY WITH ANESTHESIA N/A 11/21/2021   Procedure: IR WITH ANESTHESIA;  Surgeon: Radiologist, Medication, MD;  Location: [facility] OR;  Service: Radiology;  Laterality: N/A;   SAVORY DILATION N/A 06/18/2022   Procedure: SAVORY DILATION;  Surgeon: Meridee Score Netty Starring., MD;  Location: [facility] ENDOSCOPY;  Service: Gastroenterology;  Laterality: N/A;   SPINAL CORD STIMULATOR INSERTION N/A 07/07/2019   Procedure: LUMBAR SPINAL CORD STIMULATOR INSERTION;  Surgeon: Odette Fraction, MD;  Location: [facility] OR;  Service: Neurosurgery;  Laterality: N/A;   total left knee      Social History   Social History   Tobacco Use   Smoking status: Some Days    Current packs/day: 0.00    Average packs/day: 0.5 packs/day for 62.0 years (31.0 ttl pk-yrs)    Types: Cigarettes    Start date: 05/07/1957    Last attempt to quit: 05/08/2019  Years since quitting: 3.5    Passive exposure: Past   Smokeless tobacco: Never  Vaping Use   Vaping status: Never Used  Substance Use Topics   Alcohol use: No   Drug use: No    Medications   Prior to Admission medications   Medication Sig Start Date End Date Taking? Authorizing Provider  albuterol (VENTOLIN HFA) 108 (90 Base) MCG/ACT inhaler Inhale into the lungs every 6 (six) hours as needed for wheezing or shortness of breath.    [provider]  apixaban (ELIQUIS) 5 MG TABS tablet Take 1 tablet (5 mg total) by mouth 2 (two) times daily. 11/28/21   Revankar, Aundra Dubin, MD  bethanechol (URECHOLINE) 50 MG tablet Take 50 mg by mouth 2 (two) times daily. 07/23/22   [provider]  Coenzyme Q10 (CO Q-10) 200 MG CAPS Take 200 mg by mouth  in the morning.    [provider]  Cyanocobalamin (VITAMIN B-12) 500 MCG SUBL Place 500 mcg under the tongue in the morning.    [provider]  diphenhydrAMINE (BENADRYL) 25 MG tablet Take 25 mg by mouth in the morning.    [provider]  docusate sodium (COLACE) 100 MG capsule Take 100 mg by mouth daily. 10/08/21   [provider]  fluticasone furoate-vilanterol (BREO ELLIPTA) 100-25 MCG/ACT AEPB Inhale 1-2 puffs into the lungs daily.    [provider]  gabapentin (NEURONTIN) 600 MG tablet Take 600 mg by mouth at bedtime. 04/06/21   [provider]  HYDROcodone-acetaminophen (NORCO) 10-325 MG tablet Take 0.5 tablets by mouth 4 (four) times daily.    [provider]  Iron, Ferrous Sulfate, 325 (65 Fe) MG TABS Take 65 mg of iron by mouth daily with breakfast.    [provider]  latanoprost (XALATAN) 0.005 % ophthalmic solution Place 1 drop into both eyes at bedtime. 11/17/21   [provider]  lovastatin (MEVACOR) 40 MG tablet Take 40 mg by mouth at bedtime.     [provider]  methocarbamol (ROBAXIN) 500 MG tablet Take 1 tablet (500 mg total) by mouth every 6 (six) hours as needed for muscle spasms. Patient taking differently: Take 250 mg by mouth 3 (three) times daily. 08/29/21   Lisbeth Renshaw, MD  Multiple Vitamins-Minerals (CENTRUM SILVER 50+WOMEN) TABS Take 1 tablet by mouth daily with breakfast.    [provider]  naloxone (NARCAN) nasal spray 4 mg/0.1 mL Place 1 spray into the nose daily as needed (accidental overdose).    [provider]  nitroGLYCERIN (NITROSTAT) 0.4 MG SL tablet Place 0.4 mg under the tongue every 5 (five) minutes as needed for chest pain.    [provider]  pantoprazole (PROTONIX) 40 MG tablet Take 1 tablet (40 mg total) by mouth daily. 09/02/22   Lynann Bologna, MD  RYBELSUS 3 MG TABS Take 3 mg by mouth daily before breakfast. 05/12/21   [provider]  tamsulosin (FLOMAX) 0.4 MG CAPS capsule Take 0.4 mg by mouth 2 (two) times daily. 07/23/22   [provider]  triamcinolone ointment (KENALOG) 0.5 % Apply 1 Application topically See admin instructions. Apply to affected areas of both feet two to three times a week    [provider]  trimethoprim (TRIMPEX) 100 MG tablet Take 100 mg by mouth in the morning. 10/25/21   [provider]    Allergies  No Known Allergies  Review of Systems  ROS  Neurologic Exam  Awake, alert, oriented Memory  and concentration grossly intact Speech fluent, appropriate CN grossly intact Motor exam:   Moves BUE good strength   Diffuse pain-limited BLE weakness, non-focal Sensation grossly intact to LT  Imaging  MRI lumbar spine without contrast and subsequent with contrast both personally reviewed.  These demonstrate stable appearance of known L1 adjacent level fracture.  There is progression of L1-2 disc disease with at least moderate stenosis although evaluation is somewhat limited due to susceptibility artifact from L1-L2 hardware as well as the dorsal spinal cord stimulator lead.  There is associated FLAIR signal change within the L1 and L2 vertebral bodies.  There is T2 hyperintensity within the left superior aspect of the psoas.  Postcontrast images do not appear to reveal any enhancement within the disc space or so as to suggest underlying infection.  Impression  - 77 y.o. female with multiple medical comorbidities essentially precluding surgical management of her adjacent level fracture/stenosis.I have followed her in the office for chronic back and leg pain after her previous fusion and we had previously discussed the fact that she is not really a good surgical candidate due to the high risk of perioperative morbidity and the high risk of hardware failure/progression of adjacent level disease.  I therefore think her best option is conservative including pain  management and physical therapy.  Plan  -Continue current pain management strategies -Could consider addition of short course of steroids -PT OT -I would plan on seeing her in the outpatient clinic.  We may be able to refer her for consideration of steroid injections on an outpatient basis  I have reviewed the situation with the patient and her family at bedside.  We have discussed the above.  All their questions were answered.   Lisbeth Renshaw, MD [facility]

## 2022-12-02 NOTE — Plan of Care (Signed)
  Problem: Health Behavior/Discharge Planning: Goal: Ability to manage health-related needs will improve Outcome: Progressing   Problem: Pain Managment: Goal: General experience of comfort will improve Outcome: Progressing   Problem: Safety: Goal: Ability to remain free from injury will improve Outcome: Progressing   

## 2022-12-02 NOTE — NC FL2 (Signed)
[place] MEDICAID [facility] LEVEL OF CARE FORM     IDENTIFICATION  Patient Name: Lisa Maxwell Birthdate: September 24, 1945 Sex: female Admission Date (Current Location): 11/30/2022  [facility] and [place] Number:  [facility]   Facility and Address:  [facility] [place]. [address], [address] [place], [place] [address]      Provider Number: [id number]  Attending Physician Name and Address:  Rhetta Mura, MD  Relative Name and Phone Number:       Current Level of Care: Hospital Recommended Level of Care: Skilled Nursing Facility Prior Approval Number:    Date Approved/Denied:   PASRR Number: [id number]  Discharge Plan: SNF    Current Diagnoses: Patient Active Problem List   Diagnosis Date Noted   Hypercalcemia 12/01/2022   Hyperlipidemia 12/01/2022   Low back pain 11/30/2022   Recurrent UTI 11/26/2022   Medication management 11/26/2022   Urinary tract infection without hematuria 11/17/2022   Esophageal stricture 06/17/2022   Anorexia 06/17/2022   Abnormal barium swallow 06/17/2022   Esophageal dysphagia 06/17/2022   L1 vertebral fracture (HCC) 06/09/2022   Esophageal abnormality 06/09/2022   History of pulmonary embolus (PE) 05/28/2022   Tinea unguium 02/03/2022   History of pulmonary embolism 11/28/2021   Pulmonary embolism (HCC) 11/21/2021   Pressure injury of skin 11/21/2021   Cauda equina compression (HCC) 08/28/2021   Class 1 obesity due to excess calories with serious comorbidity and body mass index (BMI) of 34.0 to 34.9 in adult 05/20/2021   Dyshidrotic eczema 04/29/2021   DM2 (diabetes mellitus, type 2) (HCC) 04/29/2021   Chronic, continuous use of opioids 03/04/2021   Diabetes (HCC) 11/08/2020   Hypertension 11/08/2020   Muscle pain 04/29/2020   Cancer (HCC)    Cerebrovascular disease    Chronic kidney disease    Dysrhythmia    Heart murmur    Osteoarthritis of left knee    OSA (obstructive sleep apnea)    S/P insertion of spinal cord  stimulator 07/13/2019   Preop cardiovascular exam 05/01/2019   Pain management contract agreement 04/13/2019   Spondylolisthesis 03/20/2019   Lumbar spondylosis 03/20/2019   Spondylosis of lumbosacral region without myelopathy or radiculopathy 01/30/2019   DDD (degenerative disc disease), lumbar 11/17/2018   Facet joint disease 11/17/2018   History of knee replacement, total, bilateral 11/17/2018   Type 2 diabetes mellitus (HCC) 09/28/2018   Hypertensive disorder 09/28/2018   COPD (chronic obstructive pulmonary disease) (HCC) 09/28/2018   Increased body mass index 09/28/2018   Smoker 09/28/2018   Chronic renal failure 09/28/2018   Peripheral vascular disease (HCC) 09/28/2018   Atrial fibrillation, chronic (HCC) 09/28/2018   Chronic bilateral low back pain without sciatica 03/08/2018   Chronic pain syndrome 03/08/2018   Facet arthritis of lumbar region 03/08/2018   Foraminal stenosis of lumbar region 03/08/2018   Lumbar radiculopathy 03/08/2018   Spinal stenosis of lumbar region with neurogenic claudication 03/08/2018   CAD (coronary artery disease) 11/04/2016   Essential hypertension 11/04/2016   Diabetes mellitus due to underlying condition with unspecified complications (HCC) 11/04/2016   Mixed dyslipidemia 11/04/2016   Morbid obesity (HCC) 11/04/2016   PAF (paroxysmal atrial fibrillation) (HCC) 04/30/2015   Renal insufficiency 04/30/2015   CKD stage 3b, GFR 30-44 ml/min (HCC)     Orientation RESPIRATION BLADDER Height & Weight     Self, Time, Situation, Place  Normal Continent Weight: 161 lb 9.6 oz (73.3 kg) Height:  4\' 8"  (142.2 cm)  BEHAVIORAL SYMPTOMS/MOOD NEUROLOGICAL BOWEL NUTRITION STATUS  Continent Diet (regular)  AMBULATORY STATUS COMMUNICATION OF NEEDS Skin   Extensive Assist Verbally Normal                       Personal Care Assistance Level of Assistance  Bathing, Feeding, Dressing Bathing Assistance: Limited assistance Feeding assistance:  Limited assistance Dressing Assistance: Limited assistance     Functional Limitations Info  Sight Sight Info: Impaired (wears eyeglasses)        SPECIAL CARE FACTORS FREQUENCY  PT (By licensed PT), OT (By licensed OT)     PT Frequency: 5x/wk OT Frequency: 5x/wk            Contractures Contractures Info: Not present    Additional Factors Info  Code Status, Allergies, Insulin Sliding Scale Code Status Info: Full Allergies Info: NKA   Insulin Sliding Scale Info: see DC summary       Current Medications (12/02/2022):  This is the current hospital active medication list Current Facility-Administered Medications  Medication Dose Route Frequency Provider Last Rate Last Admin   acetaminophen (TYLENOL) tablet 650 mg  650 mg Oral Q6H PRN Howerter, Justin B, DO       Or   acetaminophen (TYLENOL) suppository 650 mg  650 mg Rectal Q6H PRN Howerter, Justin B, DO       albuterol (PROVENTIL) (2.5 MG/3ML) 0.083% nebulizer solution 2.5 mg  2.5 mg Nebulization Q4H PRN Howerter, Justin B, DO       apixaban (ELIQUIS) tablet 5 mg  5 mg Oral BID Howerter, Justin B, DO   5 mg at 12/02/22 1100   bethanechol (URECHOLINE) tablet 50 mg  50 mg Oral TID Kathlen Mody, MD   50 mg at 12/02/22 1100   Chlorhexidine Gluconate Cloth 2 % PADS 6 each  6 each Topical Daily Kathlen Mody, MD   6 each at 12/01/22 2030   docusate sodium (COLACE) capsule 100 mg  100 mg Oral Daily Howerter, Justin B, DO   100 mg at 12/02/22 1000   fluticasone furoate-vilanterol (BREO ELLIPTA) 100-25 MCG/ACT 1 puff  1 puff Inhalation Daily Howerter, Justin B, DO   1 puff at 12/02/22 0911   gabapentin (NEURONTIN) capsule 600 mg  600 mg Oral QHS Howerter, Justin B, DO   600 mg at 12/01/22 2150   HYDROmorphone (DILAUDID) injection 0.5 mg  0.5 mg Intravenous Q2H PRN Howerter, Justin B, DO       insulin aspart (novoLOG) injection 0-9 Units  0-9 Units Subcutaneous TID WC Howerter, Justin B, DO   2 Units at 12/02/22 1327   latanoprost  (XALATAN) 0.005 % ophthalmic solution 1 drop  1 drop Both Eyes QHS Howerter, Justin B, DO   1 drop at 12/01/22 2151   lidocaine (LIDODERM) 5 % 1 patch  1 patch Transdermal Q24H Howerter, Justin B, DO   1 patch at 12/02/22 0002   melatonin tablet 3 mg  3 mg Oral QHS PRN Howerter, Justin B, DO       methocarbamol (ROBAXIN) tablet 250 mg  250 mg Oral TID Howerter, Justin B, DO   250 mg at 12/02/22 1100   naloxone (NARCAN) injection 0.4 mg  0.4 mg Intravenous PRN Howerter, Justin B, DO       nystatin cream (MYCOSTATIN)   Topical BID Kathlen Mody, MD   Given at 12/02/22 1100   ondansetron (ZOFRAN) injection 4 mg  4 mg Intravenous Q6H PRN Howerter, Justin B, DO       oxyCODONE (Oxy IR/ROXICODONE) immediate  release tablet 10 mg  10 mg Oral Q4H PRN Terald Sleeper, MD   10 mg at 12/01/22 1728   pantoprazole (PROTONIX) EC tablet 40 mg  40 mg Oral Daily Kathlen Mody, MD   40 mg at 12/02/22 1100   pravastatin (PRAVACHOL) tablet 40 mg  40 mg Oral q1800 Howerter, Justin B, DO   40 mg at 12/01/22 1745   tamsulosin (FLOMAX) capsule 0.4 mg  0.4 mg Oral BID Kathlen Mody, MD   0.4 mg at 12/02/22 1100   trimethoprim (TRIMPEX) tablet 100 mg  100 mg Oral Daily Kathlen Mody, MD   100 mg at 12/02/22 1100     Discharge Medications: Please see discharge summary for a list of discharge medications.  Relevant Imaging Results:  Relevant Lab Results:   Additional Information SS#: [id number]  Baldemar Lenis, LCSW

## 2022-12-02 NOTE — Evaluation (Signed)
Physical Therapy Evaluation Patient Details Name: Lisa Maxwell MRN: [id number] DOB: 30-Apr-1945 Today's Date: 12/02/2022  History of Present Illness  77 yo female admitted with L side lower back pain 8/26. MRI (+) edema related to severity of DDD followed by [facility] outpatient PMH AD, cerebrovascular disease, a-fib, COPD, chronic pain syndrome, renal failure, DDD, DM, HTN, bil TKA, PVD, spinal stenosis lumbar spine with neurogenic claudication, cauda equina compression, CAD, CKD.  Clinical Impression  Pt admitted with above diagnosis. Ambulatory with rollator PTA; needed some assist with ADLs from husband. Currently requires up to mod assist with majority of mobility (bed mobility, transfer with RW.) Unable to safely ambulate due to LLE radiculopathic pain. Noted bil foot drop, which she reports is baseline from prior spinal issues. Husband able to assist minimally with physical needs such as transfers. In current state, she would benefit most from SNF level care at d/c to maximize function and independence prior to returning home with husband. Pt currently with functional limitations due to the deficits listed below (see PT Problem List). Pt will benefit from acute skilled PT to increase their independence and safety with mobility to allow discharge.           If plan is discharge home, recommend the following: A lot of help with walking and/or transfers;A lot of help with bathing/dressing/bathroom;Assistance with cooking/housework;Direct supervision/assist for medications management;Direct supervision/assist for financial management;Assist for transportation;Help with stairs or ramp for entrance   Can travel by private vehicle   Yes    Equipment Recommendations None recommended by PT     Functional Status Assessment Patient has had a recent decline in their functional status and demonstrates the ability to make significant improvements in function in a reasonable and predictable amount of  time.     Precautions / Restrictions Precautions Precautions: Back Precaution Comments: States surgeon instructed her to wear TLSO when OOB. Restrictions Weight Bearing Restrictions: No      Mobility  Bed Mobility Overal bed mobility: Needs Assistance Bed Mobility: Rolling, Sidelying to Sit Rolling: Mod assist, Used rails Sidelying to sit: Max assist, Used rails       General bed mobility comments: Educated on log roll. Painful when HOB fully flat. Use of rail mod assist to roll, assisting wtih LLE flexion. Max assist to rise to seated position. leaning Rt, due to pain on LLE.    Transfers Overall transfer level: Needs assistance Equipment used: Rolling walker (2 wheels) Transfers: Sit to/from Stand, Bed to chair/wheelchair/BSC Sit to Stand: Mod assist   Step pivot transfers: Mod assist       General transfer comment: Mod assist for boost to stand, cues for hand placement. leaning posteriorly and WB mostly through RLE. Rigid due to pain. Progressed with step pivot towards Rt, requires mod assist for RW control, to facilitate and provide external support for balance. VC throughout.    Ambulation/Gait               General Gait Details: restricted due to pain.  Stairs            Wheelchair Mobility     Tilt Bed    Modified Rankin (Stroke Patients Only)       Balance Overall balance assessment: Needs assistance Sitting-balance support: Bilateral upper extremity supported, Feet supported Sitting balance-Leahy Scale: Poor   Postural control: Right lateral lean Standing balance support: Bilateral upper extremity supported, During functional activity, Reliant on assistive device for balance Standing balance-Leahy Scale: Poor  Pertinent Vitals/Pain Pain Assessment Pain Assessment: Faces Faces Pain Scale: Hurts whole lot Pain Location: L LE (with movement.) Pain Descriptors / Indicators: Burning, Grimacing,  Guarding, Shooting Pain Intervention(s): Monitored during session, Repositioned, Limited activity within patient's tolerance    Home Living Family/patient expects to be discharged to:: Private residence Living Arrangements: Spouse/significant other Available Help at Discharge: Family;Available 24 hours/day Type of Home: House Home Access: Ramped entrance     Alternate Level Stairs-Number of Steps: 4 steps - Has stair lift Home Layout: Two level;Bed/bath upstairs;Able to live on main level with bedroom/bathroom Home Equipment: Rolling Walker (2 wheels);Rollator (4 wheels);BSC/3in1;Shower seat - built in;Grab bars - tub/shower;Wheelchair - manual;Shower seat Additional Comments: has a stair lift    Prior Function Prior Level of Function : Needs assist             Mobility Comments: Pt amb laps around house with rollator modified independent. ADLs Comments: reports spouse helps     Extremity/Trunk Assessment   Upper Extremity Assessment Upper Extremity Assessment: Defer to OT evaluation    Lower Extremity Assessment Lower Extremity Assessment: RLE deficits/detail;LLE deficits/detail RLE Deficits / Details: foot drop - reports hx of this. gross weakness. LLE Deficits / Details: foot drop - reports hx of this. gross weakness. Reports shooting pain down the leg    Cervical / Trunk Assessment Cervical / Trunk Assessment: Kyphotic  Communication   Communication Communication: Hearing impairment  Cognition Arousal: Alert Behavior During Therapy: WFL for tasks assessed/performed Overall Cognitive Status: History of cognitive impairments - at baseline                                          General Comments General comments (skin integrity, edema, etc.): Husband present, observed. Back brace donned. Educated on release to prevent further skin breakdown when resting.    Exercises General Exercises - Lower Extremity Ankle Circles/Pumps: AROM, Both, 15 reps,  Seated Quad Sets: Strengthening, Both, 15 reps, Seated Gluteal Sets: Strengthening, Both, 15 reps, Seated   Assessment/Plan    PT Assessment Patient needs continued PT services  PT Problem List Decreased strength;Decreased range of motion;Decreased activity tolerance;Decreased mobility;Decreased balance;Decreased knowledge of use of DME;Decreased knowledge of precautions;Impaired tone;Obesity;Decreased skin integrity;Pain       PT Treatment Interventions DME instruction;Gait training;Functional mobility training;Therapeutic activities;Therapeutic exercise;Neuromuscular re-education;Balance training;Patient/family education;Wheelchair mobility training;Modalities    PT Goals (Current goals can be found in the Care Plan section)  Acute Rehab PT Goals Patient Stated Goal: Get well PT Goal Formulation: With patient/family Time For Goal Achievement: 12/16/22 Potential to Achieve Goals: Good    Frequency Min 1X/week     Co-evaluation               AM-PAC PT "6 Clicks" Mobility  Outcome Measure Help needed turning from your back to your side while in a flat bed without using bedrails?: A Lot Help needed moving from lying on your back to sitting on the side of a flat bed without using bedrails?: A Lot Help needed moving to and from a bed to a chair (including a wheelchair)?: A Lot Help needed standing up from a chair using your arms (e.g., wheelchair or bedside chair)?: A Lot Help needed to walk in hospital room?: Total Help needed climbing 3-5 steps with a railing? : Total 6 Click Score: 10    End of Session Equipment Utilized During Treatment: Gait belt;Back  brace Activity Tolerance: Patient limited by pain Patient left: in chair;with call bell/phone within reach;with chair alarm set;with family/visitor present   PT Visit Diagnosis: Unsteadiness on feet (R26.81);Muscle weakness (generalized) (M62.81);Difficulty in walking, not elsewhere classified (R26.2);Other symptoms and  signs involving the nervous system (R29.898);Pain Pain - Right/Left: Left Pain - part of body: Leg (radiculopathy)    Time: 7829-5621 PT Time Calculation (min) (ACUTE ONLY): 34 min   Charges:   PT Evaluation $PT Eval Moderate Complexity: 1 Mod PT Treatments $Therapeutic Activity: 8-22 mins PT General Charges $$ ACUTE PT VISIT: 1 Visit         Kathlyn [place], PT, DPT [facility]  Rehabilitation Services Physical Therapist Office: [phone] Website: [email or web address]   Berton Mount 12/02/2022, 11:24 AM

## 2022-12-02 NOTE — TOC Initial Note (Signed)
Transition of Care [facility]) - Initial/Assessment Note    Patient Details  Name: Lisa Maxwell MRN: [id number] Date of Birth: [birth year]  Transition of Care [facility]) CM/SW Contact:    Baldemar Lenis, LCSW Phone Number: 12/02/2022, 4:28 PM  Clinical Narrative:     CSW met with patient and spouse, Kendell Bane, at bedside to discuss recommendation for SNF. Patient and spouse in agreement, preference to return to [facility] where the patient had been before. CSW answered questions from both patient and spouse, lengthy discussion of patient's positive experience from her last rehab stay. CSW confirmed with [facility] that they could offer, met with patient and spouse to provide update. Spouse also asking about how he can get the patient setup to return home when she's done with rehab, and CSW answered questions. CSW to follow.         Expected Discharge Plan: Skilled Nursing Facility Barriers to Discharge: Continued Medical Work up, English as a second language teacher   Patient Goals and CMS Choice Patient states their goals for this hospitalization and ongoing recovery are:: get rehab CMS Medicare.gov Compare Post Acute Care list provided to:: Patient Choice offered to / list presented to : Patient [place] ownership interest in [facility].provided to:: Patient    Expected Discharge Plan and Services     Post Acute Care Choice: Skilled Nursing Facility Living arrangements for the past 2 months: Single Family Home                                      Prior Living Arrangements/Services Living arrangements for the past 2 months: Single Family Home Lives with:: Spouse Patient language and need for interpreter reviewed:: No Do you feel safe going back to the place where you live?: Yes      Need for Family Participation in Patient Care: No (Comment) Care giver support system in place?: Yes (comment)   Criminal Activity/Legal Involvement Pertinent to Current  Situation/Hospitalization: No - Comment as needed  Activities of Daily Living Home Assistive Devices/Equipment: Environmental consultant (specify type), Wheelchair (rollator walker) ADL Screening (condition at time of admission) Patient's cognitive ability adequate to safely complete daily activities?: Yes Is the patient deaf or have difficulty hearing?: No Does the patient have difficulty seeing, even when wearing glasses/contacts?: No Does the patient have difficulty concentrating, remembering, or making decisions?: No Patient able to express need for assistance with ADLs?: Yes Does the patient have difficulty dressing or bathing?: Yes Independently performs ADLs?: No Communication: Independent Dressing (OT): Independent Grooming: Independent Feeding: Independent Bathing: Needs assistance Is this a change from baseline?: Change from baseline, expected to last >3 days Toileting: Needs assistance Is this a change from baseline?: Change from baseline, expected to last >3days In/Out Bed: Needs assistance Is this a change from baseline?: Change from baseline, expected to last >3 days Walks in Home: Independent with device (comment) (rollator walker/ wheelchair) Does the patient have difficulty walking or climbing stairs?: Yes Weakness of Legs: Left Weakness of Arms/Hands: None  Permission Sought/Granted Permission sought to share information with : Facility Medical sales representative, Family Supports Permission granted to share information with : Yes, Verbal Permission Granted  Share Information with NAME: Kendell Bane  Permission granted to share info w AGENCY: SNF  Permission granted to share info w Relationship: Spouse     Emotional Assessment Appearance:: Appears stated age Attitude/Demeanor/Rapport: Engaged Affect (typically observed): Appropriate Orientation: : Oriented to Self, Oriented to  Place, Oriented to  Time, Oriented to Situation Alcohol / Substance Use: Not Applicable Psych Involvement: No  (comment)  Admission diagnosis:  Low back pain [M54.50] Chronic bilateral low back pain with left-sided sciatica [M54.42, G89.29] Patient Active Problem List   Diagnosis Date Noted   Hypercalcemia 12/01/2022   Hyperlipidemia 12/01/2022   Low back pain 11/30/2022   Recurrent UTI 11/26/2022   Medication management 11/26/2022   Urinary tract infection without hematuria 11/17/2022   Esophageal stricture 06/17/2022   Anorexia 06/17/2022   Abnormal barium swallow 06/17/2022   Esophageal dysphagia 06/17/2022   L1 vertebral fracture (HCC) 06/09/2022   Esophageal abnormality 06/09/2022   History of pulmonary embolus (PE) 05/28/2022   Tinea unguium 02/03/2022   History of pulmonary embolism 11/28/2021   Pulmonary embolism (HCC) 11/21/2021   Pressure injury of skin 11/21/2021   Cauda equina compression (HCC) 08/28/2021   Class 1 obesity due to excess calories with serious comorbidity and body mass index (BMI) of 34.0 to 34.9 in adult 05/20/2021   Dyshidrotic eczema 04/29/2021   DM2 (diabetes mellitus, type 2) (HCC) 04/29/2021   Chronic, continuous use of opioids 03/04/2021   Diabetes (HCC) 11/08/2020   Hypertension 11/08/2020   Muscle pain 04/29/2020   Cancer (HCC)    Cerebrovascular disease    Chronic kidney disease    Dysrhythmia    Heart murmur    Osteoarthritis of left knee    OSA (obstructive sleep apnea)    S/P insertion of spinal cord stimulator 07/13/2019   Preop cardiovascular exam 05/01/2019   Pain management contract agreement 04/13/2019   Spondylolisthesis 03/20/2019   Lumbar spondylosis 03/20/2019   Spondylosis of lumbosacral region without myelopathy or radiculopathy 01/30/2019   DDD (degenerative disc disease), lumbar 11/17/2018   Facet joint disease 11/17/2018   History of knee replacement, total, bilateral 11/17/2018   Type 2 diabetes mellitus (HCC) 09/28/2018   Hypertensive disorder 09/28/2018   COPD (chronic obstructive pulmonary disease) (HCC) 09/28/2018    Increased body mass index 09/28/2018   Smoker 09/28/2018   Chronic renal failure 09/28/2018   Peripheral vascular disease (HCC) 09/28/2018   Atrial fibrillation, chronic (HCC) 09/28/2018   Chronic bilateral low back pain without sciatica 03/08/2018   Chronic pain syndrome 03/08/2018   Facet arthritis of lumbar region 03/08/2018   Foraminal stenosis of lumbar region 03/08/2018   Lumbar radiculopathy 03/08/2018   Spinal stenosis of lumbar region with neurogenic claudication 03/08/2018   CAD (coronary artery disease) 11/04/2016   Essential hypertension 11/04/2016   Diabetes mellitus due to underlying condition with unspecified complications (HCC) 11/04/2016   Mixed dyslipidemia 11/04/2016   Morbid obesity (HCC) 11/04/2016   PAF (paroxysmal atrial fibrillation) (HCC) 04/30/2015   Renal insufficiency 04/30/2015   CKD stage 3b, GFR 30-44 ml/min (HCC)    PCP:  Paulina Fusi, MD Pharmacy:   [facility] [phone] - [place], [place] - [address] [place] [address] Phone: [phone] Fax: [phone]  [facility] [address] [place] [address] Phone: [phone] Fax: [phone]     Social Determinants of Health (SDOH) Social History: SDOH Screenings   Food Insecurity: No Food Insecurity (12/01/2022)  Housing: Low Risk  (12/01/2022)  Transportation Needs: No Transportation Needs (12/01/2022)  Utilities: Not At Risk (12/01/2022)  Alcohol Screen: Low Risk  (07/31/2022)  Depression (PHQ2-9): Low Risk  (07/31/2022)  Financial Resource Strain: Low Risk  (07/31/2022)  Physical Activity: Unknown (07/31/2022)  Tobacco Use: High Risk (12/01/2022)  SDOH Interventions:     Readmission Risk Interventions     No data to display

## 2022-12-02 NOTE — Progress Notes (Signed)
[facility]                                                                               Lisa Maxwell, is a 77 y.o. female, DOB - Oct 07, 1945, ZHY:[id number] Admit date - 11/30/2022    Outpatient Primary MD for the patient is Paulina Fusi, MD  LOS - 0  days    Brief summary    77 y.o. female with medical history significant for severe degenerative disc disease involving the lumbar spine, chronic atrial fibrillation chronically anticoagulated on Eliquis, COPD, type 2 diabetes mellitus complicated by diabetic peripheral polyneuropathy, hyperlipidemia, stage IIIb CKD associated baseline creatinine range 1.2-1.7, who is admitted to [facility] on 11/30/2022 with low back pain after presenting from home to [facility] ED complaining of low back pain.  No recent preceding trauma.  In the setting of her known history of severe degenerative cyst disease, she follows with Dr. Conchita Paris as her outpatient neurosurgeon, and is reported to be a nonsurgical candidate as a relates to her degenerative disc disease.   Denies any acute focal weakness numbness or paresthesias, but reports that limitations relating to her use of the left leg due to suboptimal pain control and  in spite of good compliance with her outpatient pain control regimen with Norco 5/325 mg p.o. every 6 hours on a scheduled basis in addition to scheduled Robaxin 3 times daily as well as scheduled gabapentin.   She underwent MRI lumbar spine without contrast showing  new T2 hyperintense signal in the left psoas muscle at the L1-L2 level measuring 1.3 x 1.2 cm. There may be a small locule of air in this region. Findings are concerning for an abscess.  Mri of the lumbar spine with contrast ordered for further evaluation.   EDP discussed case with Dr Dutch Quint, who suggested there is no evidence of abscess, and no indication of antibiotics at this time. Dr Conchita Paris will be notified by Dr Dutch Quint.   Assessment & Plan     Assessment and Plan:   Acute on chronic left lower back pain associated with some radiculopathy Pain not well-controlled with Norco, gabapentin and Robaxin. MRI without contrast done in Dr. Dutch Quint with neurosurgery is not convinced that patient has any abscess or infection. MR lower spine with contrast showed limited findings because of signal description from hardware there is no however discernible findings of infection-findings simply could be related to recent fusion and the inferior endplate fracture L1  Mild hypercalcemia Resolved with IV fluids Repeat labs to ensure no recurrence  Hypomagnesemia Replaced previously and will use magnesium 500 twice daily orally but requires repeat labs a.m.  Chronic atrial fibrillation CHA2DS2-VASc >3 Not on any kind of rate limiting agents at this time. Last echocardiogram from November 2023 Limited preserved left ventricular ejection fraction of 60 to 65% without any regional wall abnormalities. Continue with Eliquis for anticoagulation.  COPD No wheezing heard, patient is on room air Resume home bronchodilators as needed.  diabetes mellitus Continue with sliding scale insulin.  Well-controlled with blood sugars below 200 Last hemoglobin A1c around 6.1 Home meds include gabapentin 600 at bedtime which will be continued  Hyperlipidemia Resume statin.  Estimated body mass index is 36.23 kg/m as calculated from the following:   Height as of this encounter: 4\' 8"  (1.422 m).   Weight as of this encounter: 73.3 kg.  Code Status: full code.  DVT Prophylaxis:   apixaban (ELIQUIS) tablet 5 mg   Level of Care: Level of care: Progressive Family Communication: none at bedside.   Disposition Plan:     Remains inpatient appropriate:  pending evaluation by NS.   Procedures:  MRI lumbar spine with contrast ordered.   Consultants:   Neurosurgery   Antimicrobials:   Anti-infectives (From admission, onward)    Start     Dose/Rate  Route Frequency Ordered Stop   12/01/22 1715  trimethoprim (TRIMPEX) tablet 100 mg        100 mg Oral Daily 12/01/22 1623     12/01/22 1415  fluconazole (DIFLUCAN) tablet 200 mg        200 mg Oral  Once 12/01/22 1323 12/01/22 1734        Medications  Scheduled Meds:  apixaban  5 mg Oral BID   bethanechol  50 mg Oral TID   Chlorhexidine Gluconate Cloth  6 each Topical Daily   docusate sodium  100 mg Oral Daily   fluticasone furoate-vilanterol  1 puff Inhalation Daily   gabapentin  600 mg Oral QHS   insulin aspart  0-9 Units Subcutaneous TID WC   latanoprost  1 drop Both Eyes QHS   lidocaine  1 patch Transdermal Q24H   methocarbamol  250 mg Oral TID   nystatin cream   Topical BID   pantoprazole  40 mg Oral Daily   pravastatin  40 mg Oral q1800   tamsulosin  0.4 mg Oral BID   trimethoprim  100 mg Oral Daily   Continuous Infusions: PRN Meds:.acetaminophen **OR** acetaminophen, albuterol, HYDROmorphone (DILAUDID) injection, melatonin, naLOXone (NARCAN)  injection, ondansetron (ZOFRAN) IV, oxyCODONE    Subjective:   Awake pleasant coherent oriented No chest pain Still quite weak in the left lower extremity with foot drop cannot straight leg raise or even lift her knee above gravity She has some NSVT and A-fib so she needs to be on the monitor   Objective:   Vitals:   12/02/22 0356 12/02/22 0700 12/02/22 0912 12/02/22 1228  BP: 119/85 118/72  124/69  Pulse: 96 100  91  Resp: 18 19  17   Temp: (!) 97.5 F (36.4 C) 98.1 F (36.7 C)  98.6 F (37 C)  TempSrc: Oral Oral  Oral  SpO2: 92% 93% 93% 94%  Weight:      Height:        Intake/Output Summary (Last 24 hours) at 12/02/2022 1510 Last data filed at 12/02/2022 0415 Gross per 24 hour  Intake --  Output 950 ml  Net -950 ml   Filed Weights   11/30/22 1416 12/01/22 0044 12/01/22 0500  Weight: 70.3 kg 70 kg 73.3 kg     Exam EOMI NCAT no focal deficit no icterus no pallor Neck soft supple S1-S2 NSVT, A-fib on  monitors at times Abdomen soft Foot drop on left side with dense deficit straight leg raise Stronger on right side Sensory deferred   Data Reviewed:  I have personally reviewed following labs and imaging studies   CBC Lab Results  Component Value Date   WBC 6.1 12/01/2022   RBC 3.88 12/01/2022   HGB 11.1 (L) 12/01/2022   HCT 35.3 (L) 12/01/2022   MCV 91.0 12/01/2022   MCH 28.6 12/01/2022  PLT 182 12/01/2022   MCHC 31.4 12/01/2022   RDW 15.7 (H) 12/01/2022   LYMPHSABS 1.3 12/01/2022   MONOABS 0.6 12/01/2022   EOSABS 0.1 12/01/2022   BASOSABS 0.0 12/01/2022     Last metabolic panel Lab Results  Component Value Date   NA 136 12/01/2022   K 4.2 12/01/2022   CL 105 12/01/2022   CO2 24 12/01/2022   BUN 15 12/01/2022   CREATININE 1.15 (H) 12/01/2022   GLUCOSE 119 (H) 12/01/2022   GFRNONAA 49 (L) 12/01/2022   GFRAA 35 (L) 07/04/2019   CALCIUM 9.5 12/01/2022   PHOS 3.0 12/01/2022   PROT 4.9 (L) 12/01/2022   ALBUMIN 2.6 (L) 12/01/2022   BILITOT 0.5 12/01/2022   ALKPHOS 51 12/01/2022   AST 22 12/01/2022   ALT 15 12/01/2022   ANIONGAP 7 12/01/2022    CBG (last 3)  Recent Labs    12/02/22 0358 12/02/22 0844 12/02/22 1311  GLUCAP 111* 107* 188*      Coagulation Profile: No results for input(s): "INR", "PROTIME" in the last 168 hours.   Radiology Studies: MR LUMBAR SPINE W CONTRAST  Result Date: 12/01/2022 CLINICAL DATA:  Back pain. Prior surgery. New symptoms on the left. Weakness and pain. Noncontrast study showed an abnormal psoas collection at L1-2 and abnormal epidural material. EXAM: MRI LUMBAR SPINE WITH CONTRAST TECHNIQUE: Multiplanar and multiecho pulse sequences of the lumbar spine were obtained with intravenous contrast. CONTRAST:  6mL GADAVIST GADOBUTROL 1 MMOL/ML IV SOLN COMPARISON:  None Available. FINDINGS: Postcontrast imaging is extremely limited because of signal disruption from the fusion hardware at L2-3 and patient motion. The findings on  these additional exams do not definitely lead [place] to establish the diagnosis of spinal infection. I see that the patient does not have an elevated white count. Additionally, without fever, the findings could simply relate to the recent fusion and the inferior endplate fracture at L1. IMPRESSION: Extremely limited postcontrast imaging because of patient motion and signal disruption from the fusion hardware at L2-3. The findings on these additional exams do not definitely lead [place] to establish the diagnosis of spinal infection. I see that the patient does not have an elevated white count. Additionally, without fever, the findings could simply relate to the recent fusion and the inferior endplate fracture at L1. Electronically Signed   By: Paulina Fusi M.D.   On: 12/01/2022 18:22   MR LUMBAR SPINE WO CONTRAST  Result Date: 11/30/2022 CLINICAL DATA:  Low back pain, prior surgery, new symptoms left L1-L3 radiculopathy, weakness and pain with left hip flexion EXAM: MRI LUMBAR SPINE WITHOUT CONTRAST TECHNIQUE: Multiplanar, multisequence MR imaging of the lumbar spine was performed. No intravenous contrast was administered. COMPARISON:  Lumbar spine CT dated 06/09/2022, lumbar spine MRI dated 06/09/2022 FINDINGS: Limitations: Assessment of the surgical levels is limited due to susceptibility artifact from metallic fusion hardware. Segmentation:  Standard. Alignment: Grade 1 anterolisthesis of L4 on L5. Trace retrolisthesis of L1 on L2. Vertebrae: Redemonstrated postsurgical changes from L2-L3 spinal fusion. Compared to prior exam Conus medullaris and cauda equina: Conus extends to the T12-L1 disc space level. Conus and cauda equina appear normal. Paraspinal and other soft tissues: Compared to prior exam there is new T2 hyperintense signal in the left psoas muscle the L1-L2 level. (Series 6, image 13) measuring 1.3 x 1 2 cm. There may be a small locule of air in this region. Disc levels: Compared to prior exam there is  likely new nonspecific fluid in the ventral epidural  space at the L1 and L2 levels (series 4, image 10). There is severe spinal canal stenosis L1-L2, L3-L4 levels. Moderate spinal canal stenosis at L4-L5. There is severe left neural foraminal narrowing at L4-L5, unchanged from prior exam. There are severe multilevel facet degenerative changes, most notably at L4-L5 and L5-S1. IMPRESSION: 1. Compared to prior exam, there is new T2 hyperintense signal in the left psoas muscle at the L1-L2 level measuring 1.3 x 1.2 cm. There may be a small locule of air in this region. Findings are concerning for an abscess. Recommend further evaluation with contrast enhanced lumbar spine MRI. 2. Compared to prior exam, there is likely new nonspecific fluid in the ventral epidural space at the L1 and L2 levels. Recommend attention on contrast-enhanced MRI of the lumbar spine to exclude the possibility for ventral epidural abscess. 3. Redemonstrated postsurgical changes from L2-L3 spinal fusion. 4. Severe spinal canal stenosis at L1-L2 and L3-L4 levels. 5. Severe left neural foraminal narrowing at L4-L5. Electronically Signed   By: Lorenza Cambridge M.D.   On: 11/30/2022 20:48   DG Hip Unilat W or Wo Pelvis 2-3 Views Left  Result Date: 11/30/2022 CLINICAL DATA:  Hip pain.  Back pain. EXAM: DG HIP (WITH OR WITHOUT PELVIS) 2-3V LEFT COMPARISON:  None Available. FINDINGS: Pelvis is intact with normal and symmetric sacroiliac joints. No acute fracture or dislocation. No aggressive osseous lesion. Visualized sacral arcuate lines are unremarkable. Unremarkable symphysis pubis. There are mild degenerative changes of bilateral hip joints without significant joint space narrowing. Osteophytosis of the superior acetabulum. No radiopaque foreign bodies. Neurostimulator device battery pack is seen overlying the right iliac wing. IMPRESSION: 1. Mild bilateral hip osteoarthritis. Electronically Signed   By: Jules Schick M.D.   On: 11/30/2022 16:12    DG Lumbar Spine Complete  Result Date: 11/30/2022 CLINICAL DATA:  Back and hip pain. EXAM: LUMBAR SPINE - COMPLETE 4+ VIEW COMPARISON:  Lumbar spine 09/09/2022 and MRI 06/09/2022 FINDINGS: Scoliosis in the lumbar spine with the apex towards the right at L1-L2. Posterior lumbar interbody fusion at L2-L3 with bilateral pedicle screws, rods and an interbody device. Surgical hardware is similar to the previous examination. Deformity along the anterior inferior endplate pf L1 appears unchanged. Stable grade 1 anterolisthesis of L4 on L5. Extensive facet arthropathy in the lower lumbar spine. Patient has a spinal stimulator device in the thoracic spine. IMPRESSION: 1. No acute abnormality in the lumbar spine. 2. Stable postsurgical changes at L2-L3. 3. Chronic scoliosis and degenerative changes in the lumbar spine. Electronically Signed   By: Richarda Overlie M.D.   On: 11/30/2022 16:07       Rhetta Mura M.D. [facility] 12/02/2022, 3:10 PM  Available via Epic secure chat 7am-7pm After 7 pm, please refer to night coverage provider listed on amion.

## 2022-12-02 NOTE — Progress Notes (Signed)
Sitting up in the chair and enjoying being in the char with no distress and no complaints.

## 2022-12-02 NOTE — Plan of Care (Signed)
  Problem: Education: Goal: Knowledge of General Education information will improve Description Including pain rating scale, medication(s)/side effects and non-pharmacologic comfort measures Outcome: Progressing   

## 2022-12-02 NOTE — Care Management Obs Status (Signed)
MEDICARE OBSERVATION STATUS NOTIFICATION   Patient Details  Name: Lisa Maxwell MRN: [id number] Date of Birth: December 20, 1945   Medicare Observation Status Notification Given:  Yes    Baldemar Lenis, LCSW 12/02/2022, 2:22 PM

## 2022-12-03 DIAGNOSIS — M5442 Lumbago with sciatica, left side: Secondary | ICD-10-CM | POA: Diagnosis not present

## 2022-12-03 LAB — GLUCOSE, CAPILLARY
Glucose-Capillary: 120 mg/dL — ABNORMAL HIGH (ref 70–99)
Glucose-Capillary: 140 mg/dL — ABNORMAL HIGH (ref 70–99)
Glucose-Capillary: 165 mg/dL — ABNORMAL HIGH (ref 70–99)
Glucose-Capillary: 159 mg/dL — ABNORMAL HIGH (ref 70–99)
Glucose-Capillary: 129 mg/dL — ABNORMAL HIGH (ref 70–99)

## 2022-12-03 LAB — COMPREHENSIVE METABOLIC PANEL
ALT: 16 U/L (ref 0–44)
AST: 27 U/L (ref 15–41)
Albumin: 2.5 g/dL — ABNORMAL LOW (ref 3.5–5.0)
Alkaline Phosphatase: 55 U/L (ref 38–126)
Anion gap: 5 (ref 5–15)
BUN: 14 mg/dL (ref 8–23)
CO2: 24 mmol/L (ref 22–32)
Calcium: 9.3 mg/dL (ref 8.9–10.3)
Chloride: 103 mmol/L (ref 98–111)
Creatinine, Ser: 1.24 mg/dL — ABNORMAL HIGH (ref 0.44–1.00)
GFR, Estimated: 45 mL/min — ABNORMAL LOW (ref >60)
Glucose, Bld: 134 mg/dL — ABNORMAL HIGH (ref 70–99)
Potassium: 4.2 mmol/L (ref 3.5–5.1)
Sodium: 132 mmol/L — ABNORMAL LOW (ref 135–145)
Total Bilirubin: 0.5 mg/dL (ref 0.3–1.2)
Total Protein: 5.2 g/dL — ABNORMAL LOW (ref 6.5–8.1)

## 2022-12-03 LAB — MAGNESIUM: Magnesium: 1.8 mg/dL (ref 1.7–2.4)

## 2022-12-03 MED ORDER — METHYLPREDNISOLONE 4 MG PO TBPK
8.0000 mg | ORAL_TABLET | Freq: Every evening | ORAL | Status: AC
  Administered 2022-12-04: 8 mg via ORAL

## 2022-12-03 MED ORDER — METHYLPREDNISOLONE 4 MG PO TBPK
8.0000 mg | ORAL_TABLET | Freq: Every evening | ORAL | Status: AC
  Administered 2022-12-03: 8 mg via ORAL

## 2022-12-03 MED ORDER — METHYLPREDNISOLONE 4 MG PO TBPK
4.0000 mg | ORAL_TABLET | Freq: Three times a day (TID) | ORAL | Status: AC
  Administered 2022-12-04 (×3): 4 mg via ORAL

## 2022-12-03 MED ORDER — METHYLPREDNISOLONE 4 MG PO TBPK
4.0000 mg | ORAL_TABLET | Freq: Four times a day (QID) | ORAL | Status: DC
  Administered 2022-12-05 – 2022-12-07 (×8): 4 mg via ORAL

## 2022-12-03 MED ORDER — METHYLPREDNISOLONE 4 MG PO TBPK
4.0000 mg | ORAL_TABLET | ORAL | Status: AC
  Administered 2022-12-03: 4 mg via ORAL

## 2022-12-03 MED ORDER — METHYLPREDNISOLONE 4 MG PO TBPK
8.0000 mg | ORAL_TABLET | Freq: Every morning | ORAL | Status: AC
  Administered 2022-12-03: 8 mg via ORAL
  Filled 2022-12-03: qty 21

## 2022-12-03 NOTE — TOC Progression Note (Signed)
Transition of Care [facility]) - Progression Note    Patient Details  Name: Lisa Maxwell MRN: [id number] Date of Birth: Jun 16, 1945  Transition of Care [facility]) CM/SW Contact  Baldemar Lenis, [place] Phone Number: 12/03/2022, 10:36 AM  Clinical Narrative:   CSW sent request to CMA to initiate insurance authorization request. CSW to follow.    Expected Discharge Plan: Skilled Nursing Facility Barriers to Discharge: Continued Medical Work up, English as a second language teacher  Expected Discharge Plan and Services     Post Acute Care Choice: Skilled Nursing Facility Living arrangements for the past 2 months: Single Family Home                                       Social Determinants of Health (SDOH) Interventions SDOH Screenings   Food Insecurity: No Food Insecurity (12/01/2022)  Housing: Low Risk  (12/01/2022)  Transportation Needs: No Transportation Needs (12/01/2022)  Utilities: Not At Risk (12/01/2022)  Alcohol Screen: Low Risk  (07/31/2022)  Depression (PHQ2-9): Low Risk  (07/31/2022)  Financial Resource Strain: Low Risk  (07/31/2022)  Physical Activity: Unknown (07/31/2022)  Tobacco Use: High Risk (12/01/2022)    Readmission Risk Interventions     No data to display

## 2022-12-03 NOTE — Progress Notes (Signed)
Physical Therapy Treatment Patient Details Name: Lisa Maxwell MRN: [id number] DOB: Dec 17, 1945 Today's Date: 12/03/2022   History of Present Illness 77 yo female admitted with L side lower back pain 8/26. MRI (+) edema related to severity of DDD followed by [facility] outpatient PMH AD, cerebrovascular disease, a-fib, COPD, chronic pain syndrome, renal failure, DDD, DM, HTN, bil TKA, PVD, spinal stenosis lumbar spine with neurogenic claudication, cauda equina compression, CAD, CKD.    PT Comments  Pleasant and eager to participate with physical therapy today. Reports she had a very difficult time transferring from bed to recliner this morning. Upon my visit this afternoon, pt required +1 mod assist for sit to stand from recliner. She does demonstrates a tendency to lean posteriorly, needing min assist to lean forward and maintain balance. Once standing, pt able to participate in pre-gait activity with weight shift and march (Unable to clear LLE independently from the ground today however.) Readjusted back brace for comfort and alignment. Educated on use, awareness, and reviewed LE exercises to perform periodically throughout the day to limit muscle atrophy and secondary complications associated with immobility. Will continue to follow an progress until d/c. Patient will continue to benefit from skilled physical therapy services to further improve independence with functional mobility.     If plan is discharge home, recommend the following: A lot of help with walking and/or transfers;A lot of help with bathing/dressing/bathroom;Assistance with cooking/housework;Direct supervision/assist for medications management;Direct supervision/assist for financial management;Assist for transportation;Help with stairs or ramp for entrance   Can travel by private vehicle     Yes  Equipment Recommendations  None recommended by PT    Recommendations for Other Services       Precautions / Restrictions  Precautions Precautions: Back Precaution Comments: States surgeon instructed her to wear TLSO when OOB. Restrictions Weight Bearing Restrictions: No     Mobility  Bed Mobility               General bed mobility comments: in recliner    Transfers Overall transfer level: Needs assistance Equipment used: Rolling walker (2 wheels) Transfers: Sit to/from Stand, Bed to chair/wheelchair/BSC Sit to Stand: Mod assist           General transfer comment: Mod assist for boost to stand from recliner. heavy VC throughout for sequencing and scooting to edge of chair, and returning back to upright position.    Ambulation/Gait             Pre-gait activities: Pre-gait with weight shifting activity, upright posture with RW for support. Able to weight shift Lt and progress to Rt foot elevated from ground to simulate step. Could not advance or lift LLE from ground. Min assist for balance, showing posterior lean.     Stairs             Wheelchair Mobility     Tilt Bed    Modified Rankin (Stroke Patients Only)       Balance Overall balance assessment: Needs assistance Sitting-balance support: Feet supported, Single extremity supported Sitting balance-Leahy Scale: Poor Sitting balance - Comments: Min assist intermittently, leaning posteriorly unless cued and pulling through arm rests to remain forward. Postural control: Posterior lean Standing balance support: Bilateral upper extremity supported, During functional activity, Reliant on assistive device for balance Standing balance-Leahy Scale: Poor                              Cognition Arousal: Alert Behavior  During Therapy: WFL for tasks assessed/performed Overall Cognitive Status: History of cognitive impairments - at baseline                                 General Comments: hx of memory deficits, pleasant and follow commands consistently        Exercises General Exercises -  Lower Extremity Ankle Circles/Pumps: AROM, Both Quad Sets: Strengthening, Both Gluteal Sets: Strengthening, Both    General Comments General comments (skin integrity, edema, etc.): Brace readjusted in standing, further education on use.      Pertinent Vitals/Pain Pain Assessment Pain Assessment: Faces Faces Pain Scale: Hurts little more Pain Location: low back, L hip Pain Descriptors / Indicators: Guarding, Sore, Radiating Pain Intervention(s): Monitored during session, Repositioned, Limited activity within patient's tolerance    Home Living                          Prior Function            PT Goals (current goals can now be found in the care plan section) Acute Rehab PT Goals Patient Stated Goal: Get well PT Goal Formulation: With patient/family Time For Goal Achievement: 12/16/22 Potential to Achieve Goals: Good Progress towards PT goals: Progressing toward goals    Frequency    Min 1X/week      PT Plan      Co-evaluation              AM-PAC PT "6 Clicks" Mobility   Outcome Measure  Help needed turning from your back to your side while in a flat bed without using bedrails?: A Lot Help needed moving from lying on your back to sitting on the side of a flat bed without using bedrails?: A Lot Help needed moving to and from a bed to a chair (including a wheelchair)?: A Lot Help needed standing up from a chair using your arms (e.g., wheelchair or bedside chair)?: A Lot Help needed to walk in hospital room?: Total Help needed climbing 3-5 steps with a railing? : Total 6 Click Score: 10    End of Session Equipment Utilized During Treatment: Gait belt;Back brace Activity Tolerance: Patient limited by pain Patient left: in chair;with call bell/phone within reach;with chair alarm set;with family/visitor present   PT Visit Diagnosis: Unsteadiness on feet (R26.81);Muscle weakness (generalized) (M62.81);Difficulty in walking, not elsewhere classified  (R26.2);Other symptoms and signs involving the nervous system (R29.898);Pain Pain - Right/Left: Left Pain - part of body: Leg     Time: 4034-7425 PT Time Calculation (min) (ACUTE ONLY): 18 min  Charges:    $Therapeutic Activity: 8-22 mins PT General Charges $$ ACUTE PT VISIT: 1 Visit                     Kathlyn Sacramento, PT, DPT [facility]  Rehabilitation Services Physical Therapist Office: [phone] Website: [email or web address]    Berton Mount 12/03/2022, 1:56 PM

## 2022-12-03 NOTE — Progress Notes (Signed)
[facility]                                                                               Lisa Maxwell, is a 77 y.o. female, DOB - [birth year], VWU:[id number] Admit date - 11/30/2022    Outpatient Primary MD for the patient is Paulina Fusi, MD  LOS - 0  days    Brief summary    77 y.o. female with medical history significant for severe degenerative disc disease involving the lumbar spine, chronic atrial fibrillation chronically anticoagulated on Eliquis, COPD, type 2 diabetes mellitus complicated by diabetic peripheral polyneuropathy, hyperlipidemia, stage IIIb CKD associated baseline creatinine range 1.2-1.7, who is admitted to [facility] on 11/30/2022 with low back pain after presenting from home to [facility] ED complaining of low back pain.  No recent preceding trauma.  In the setting of her known history of severe degenerative cyst disease, she follows with Dr. Conchita Paris as her outpatient neurosurgeon, and is reported to be a nonsurgical candidate as a relates to her degenerative disc disease.   Denies any acute focal weakness numbness or paresthesias, but reports that limitations relating to her use of the left leg due to suboptimal pain control and  in spite of good compliance with her outpatient pain control regimen with Norco 5/325 mg p.o. every 6 hours on a scheduled basis in addition to scheduled Robaxin 3 times daily as well as scheduled gabapentin.   She underwent MRI lumbar spine without contrast showing  new T2 hyperintense signal in the left psoas muscle at the L1-L2 level measuring 1.3 x 1.2 cm. There may be a small locule of air in this region. Findings are concerning for an abscess.  Mri of the lumbar spine with contrast ordered for further evaluation.   EDP discussed case with Dr Dutch Quint, who suggested there is no evidence of abscess, and no indication of antibiotics at this time. Dr Conchita Paris will be notified by Dr Dutch Quint.   Assessment & Plan     Assessment and Plan:   Acute on chronic left lower back pain associated with some radiculopathy Pain not well-controlled with Norco, gabapentin and Robaxin. MRI without contrast done in Dr. Dutch Quint with neurosurgery is not convinced that patient has any abscess or infection. MR lower spine with contrast - limited findings because of signal description from hardware there is no however discernible findings of infection-findings simply could be related to recent fusion and the inferior endplate fracture L1 Patient was informed that may require steroids for neuropathic pain and I have asked that nursing contact neurosurgeon Dr. Conchita Paris to figure out what dosing he wants and duration of therapy  Mild hypercalcemia Resolved with IV fluids Resolved at this time  Hypomagnesemia Replaced previously and will use magnesium 500 twice daily orally but requires repeat labs a.m.--- continue replacement  Chronic atrial fibrillation CHA2DS2-VASc >3 Not on any kind of rate limiting agents at this time--is in rate controlled A-fib overall Last echocardiogram from November 2023 Limited preserved left ventricular ejection fraction of 60 to 65% without any regional wall abnormalities. Continue with Eliquis 5 twice daily for anticoagulation.  COPD Wheezes stable, as needed nebulized albuterol 2.5 every 4  as needed and Breo Ellipta 1 daily  diabetes mellitus Continue with sliding scale insulin.  Well-controlled with blood sugars below 1 20-200 Last hemoglobin A1c around 6.1 Home meds include gabapentin 600 at bedtime which will be continued  Hyperlipidemia Resume statin.  Estimated body mass index is 35.49 kg/m as calculated from the following:   Height as of this encounter: 4\' 8"  (1.422 m).   Weight as of this encounter: 71.8 kg.  Code Status: full code.  DVT Prophylaxis:   apixaban (ELIQUIS) tablet 5 mg   Level of Care: Level of care: Progressive Family Communication: none at bedside.    Disposition Plan:     Remains inpatient appropriate:  pending evaluation by NS.   Procedures:  MRI lumbar spine with contrast ordered.   Consultants:   Neurosurgery   Antimicrobials:   Anti-infectives (From admission, onward)    Start     Dose/Rate Route Frequency Ordered Stop   12/01/22 1715  trimethoprim (TRIMPEX) tablet 100 mg        100 mg Oral Daily 12/01/22 1623     12/01/22 1415  fluconazole (DIFLUCAN) tablet 200 mg        200 mg Oral  Once 12/01/22 1323 12/01/22 1734        Medications  Scheduled Meds:  apixaban  5 mg Oral BID   bethanechol  50 mg Oral TID   Chlorhexidine Gluconate Cloth  6 each Topical Daily   docusate sodium  100 mg Oral Daily   fluticasone furoate-vilanterol  1 puff Inhalation Daily   gabapentin  600 mg Oral QHS   insulin aspart  0-9 Units Subcutaneous TID WC   latanoprost  1 drop Both Eyes QHS   lidocaine  1 patch Transdermal Q24H   magnesium gluconate  500 mg Oral BID   methocarbamol  250 mg Oral TID   nystatin cream   Topical BID   pantoprazole  40 mg Oral Daily   pravastatin  40 mg Oral q1800   tamsulosin  0.4 mg Oral BID   trimethoprim  100 mg Oral Daily   Continuous Infusions: PRN Meds:.acetaminophen **OR** acetaminophen, albuterol, melatonin, naLOXone (NARCAN)  injection, ondansetron (ZOFRAN) IV, oxyCODONE    Subjective:   Coughing a lot at the bedside sounds very upper laryngeal Tells me that she has previously had a dilatation Does not cough when she eats Eating and swallowing fine Had some difficulty with therapy who continue to recommend skilled   Objective:   Vitals:   12/03/22 0400 12/03/22 0552 12/03/22 0920 12/03/22 1234  BP: 114/79  128/83 (!) 112/58  Pulse: 83  (!) 109 98  Resp: 18  20 19   Temp: (!) 97.5 F (36.4 C)  98.4 F (36.9 C) 97.7 F (36.5 C)  TempSrc: Oral  Oral Oral  SpO2: 95%  94% 98%  Weight:  71.8 kg    Height:        Intake/Output Summary (Last 24 hours) at 12/03/2022 1448 Last data  filed at 12/03/2022 0404 Gross per 24 hour  Intake --  Output 1000 ml  Net -1000 ml   Filed Weights   12/01/22 0044 12/01/22 0500 12/03/22 0552  Weight: 70 kg 73.3 kg 71.8 kg     Awake coherent no distress Transmitted upper respiratory sounds Abdomen obese nontender no rebound Some antalgia noted with straight leg raise Chest is clear no added S1-S2 no murmur   Data Reviewed:  I have personally reviewed following labs and imaging studies   CBC Lab  Results  Component Value Date   WBC 6.1 12/01/2022   RBC 3.88 12/01/2022   HGB 11.1 (L) 12/01/2022   HCT 35.3 (L) 12/01/2022   MCV 91.0 12/01/2022   MCH 28.6 12/01/2022   PLT 182 12/01/2022   MCHC 31.4 12/01/2022   RDW 15.7 (H) 12/01/2022   LYMPHSABS 1.3 12/01/2022   MONOABS 0.6 12/01/2022   EOSABS 0.1 12/01/2022   BASOSABS 0.0 12/01/2022     Last metabolic panel Lab Results  Component Value Date   NA 132 (L) 12/03/2022   K 4.2 12/03/2022   CL 103 12/03/2022   CO2 24 12/03/2022   BUN 14 12/03/2022   CREATININE 1.24 (H) 12/03/2022   GLUCOSE 134 (H) 12/03/2022   GFRNONAA 45 (L) 12/03/2022   GFRAA 35 (L) 07/04/2019   CALCIUM 9.3 12/03/2022   PHOS 3.0 12/01/2022   PROT 5.2 (L) 12/03/2022   ALBUMIN 2.5 (L) 12/03/2022   BILITOT 0.5 12/03/2022   ALKPHOS 55 12/03/2022   AST 27 12/03/2022   ALT 16 12/03/2022   ANIONGAP 5 12/03/2022    CBG (last 3)  Recent Labs    12/03/22 0402 12/03/22 0918 12/03/22 1237  GLUCAP 120* 140* 165*      Coagulation Profile: No results for input(s): "INR", "PROTIME" in the last 168 hours.   Radiology Studies: No results found.   Rhetta Mura M.D. [facility] 12/03/2022, 2:48 PM  Available via Epic secure chat 7am-7pm After 7 pm, please refer to night coverage provider listed on amion.

## 2022-12-03 NOTE — Progress Notes (Signed)
Occupational Therapy Treatment Patient Details Name: Lisa Maxwell MRN: [id number] DOB: 1945/06/22 Today's Date: 12/03/2022   History of present illness 77 yo female admitted with L side lower back pain 8/26. MRI (+) edema related to severity of DDD followed by [facility] outpatient PMH AD, cerebrovascular disease, a-fib, COPD, chronic pain syndrome, renal failure, DDD, DM, HTN, bil TKA, PVD, spinal stenosis lumbar spine with neurogenic claudication, cauda equina compression, CAD, CKD.   OT comments  Pt remains eager to participate, observable improvements in activity tolerance with pain premedication. Pt's spouse present, supportive and hands on to assist. Pt requires Mod A x 2 for pivoting to recliner with RW and step by step cueing to sequence all mobility. Pt requires assist for TLSO brace mgmt, reminders for back precautions to minimize pain. Pt and spouse hopeful for further improvements with continued rehab at DC.      If plan is discharge home, recommend the following:  Two people to help with walking and/or transfers;Two people to help with bathing/dressing/bathroom   Equipment Recommendations  None recommended by OT    Recommendations for Other Services      Precautions / Restrictions Precautions Precautions: Back Precaution Comments: States surgeon instructed her to wear TLSO when OOB. Restrictions Weight Bearing Restrictions: No       Mobility Bed Mobility Overal bed mobility: Needs Assistance Bed Mobility: Rolling, Sidelying to Sit Rolling: Mod assist, Used rails Sidelying to sit: Mod assist, HOB elevated, +2 for safety/equipment       General bed mobility comments: cues for attention to log rolling, staying rolled on side untile LE kicked off of bed    Transfers Overall transfer level: Needs assistance Equipment used: Rolling walker (2 wheels) Transfers: Sit to/from Stand, Bed to chair/wheelchair/BSC Sit to Stand: Mod assist     Step pivot transfers: Mod  assist, +2 safety/equipment     General transfer comment: step by step cues for stepping feet to chair with tactile cues/assist to initiate each movement and assist for balance     Balance Overall balance assessment: Needs assistance Sitting-balance support: Feet supported, Single extremity supported Sitting balance-Leahy Scale: Poor Sitting balance - Comments: Min A vs reliance on RUE support EOB w/ R lateral lean Postural control: Right lateral lean Standing balance support: Bilateral upper extremity supported, During functional activity, Reliant on assistive device for balance Standing balance-Leahy Scale: Poor                             ADL either performed or assessed with clinical judgement   ADL Overall ADL's : Needs assistance/impaired                 Upper Body Dressing : Minimal assistance;Sitting   Lower Body Dressing: Total assistance;Sit to/from stand   Toilet Transfer: Moderate assistance;+2 for physical assistance;+2 for safety/equipment;Rolling walker (2 wheels) Toilet Transfer Details (indicate cue type and reason): simulated to chair                Extremity/Trunk Assessment Upper Extremity Assessment Upper Extremity Assessment: Generalized weakness;Right hand dominant   Lower Extremity Assessment Lower Extremity Assessment: Defer to PT evaluation        Vision   Vision Assessment?: No apparent visual deficits   Perception     Praxis      Cognition Arousal: Alert Behavior During Therapy: WFL for tasks assessed/performed Overall Cognitive Status: History of cognitive impairments - at baseline  General Comments: hx of memory deficits, pleasant and follow commands consistently        Exercises      Shoulder Instructions       General Comments      Pertinent Vitals/ Pain       Pain Assessment Pain Assessment: Faces Faces Pain Scale: Hurts even more Pain Location: low  back, L hip Pain Descriptors / Indicators: Grimacing, Guarding Pain Intervention(s): Monitored during session, Limited activity within patient's tolerance, Premedicated before session, Repositioned  Home Living                                          Prior Functioning/Environment              Frequency  Min 1X/week        Progress Toward Goals  OT Goals(current goals can now be found in the care plan section)  Progress towards OT goals: Progressing toward goals  Acute Rehab OT Goals Patient Stated Goal: decrease pain, go to rehab OT Goal Formulation: With patient Time For Goal Achievement: 12/15/22 Potential to Achieve Goals: Good ADL Goals Pt Will Perform Upper Body Dressing: sitting;with modified independence Pt Will Transfer to Toilet: with mod assist;bedside commode;stand pivot transfer Additional ADL Goal #1: pt will complete bed mobility min (A) as precursor to adls. Additional ADL Goal #2: pt will tolerate static sitting for 2 minutes for adl task  Plan      Co-evaluation                 AM-PAC OT "6 Clicks" Daily Activity     Outcome Measure   Help from another person eating meals?: None Help from another person taking care of personal grooming?: A Little Help from another person toileting, which includes using toliet, bedpan, or urinal?: A Lot Help from another person bathing (including washing, rinsing, drying)?: A Lot Help from another person to put on and taking off regular upper body clothing?: A Little Help from another person to put on and taking off regular lower body clothing?: A Lot 6 Click Score: 16    End of Session Equipment Utilized During Treatment: Gait belt;Rolling walker (2 wheels);Back brace  OT Visit Diagnosis: Muscle weakness (generalized) (M62.81);Pain Pain - Right/Left: Left Pain - part of body: Leg   Activity Tolerance Patient limited by pain;Patient tolerated treatment well   Patient Left in  chair;with call bell/phone within reach;with chair alarm set;with family/visitor present   Nurse Communication Mobility status;Patient requests pain meds        Time: 1032-1101 OT Time Calculation (min): 29 min  Charges: OT General Charges $OT Visit: 1 Visit OT Treatments $Self Care/Home Management : 8-22 mins $Therapeutic Activity: 8-22 mins  Bradd Canary, OTR/L Acute Rehab Services Office: [phone]   Lorre Munroe 12/03/2022, 11:39 AM

## 2022-12-03 NOTE — TOC Progression Note (Signed)
Transition of Care [facility]) - Progression Note    Patient Details  Name: Lisa Maxwell MRN: [id number] Date of Birth: [birth year]  Transition of Care [facility]) CM/SW Contact  Baldemar Lenis, [place] Phone Number: 12/03/2022, 3:22 PM  Clinical Narrative:   Patient has received insurance authorization for SNF, but bed won't be available until tomorrow. Family aware. CSW to follow.    Expected Discharge Plan: Skilled Nursing Facility Barriers to Discharge: Continued Medical Work up, English as a second language teacher  Expected Discharge Plan and Services     Post Acute Care Choice: Skilled Nursing Facility Living arrangements for the past 2 months: Single Family Home                                       Social Determinants of Health (SDOH) Interventions SDOH Screenings   Food Insecurity: No Food Insecurity (12/01/2022)  Housing: Low Risk  (12/01/2022)  Transportation Needs: No Transportation Needs (12/01/2022)  Utilities: Not At Risk (12/01/2022)  Alcohol Screen: Low Risk  (07/31/2022)  Depression (PHQ2-9): Low Risk  (07/31/2022)  Financial Resource Strain: Low Risk  (07/31/2022)  Physical Activity: Unknown (07/31/2022)  Tobacco Use: High Risk (12/01/2022)    Readmission Risk Interventions     No data to display

## 2022-12-04 ENCOUNTER — Inpatient Hospital Stay (HOSPITAL_COMMUNITY): Payer: Medicare HMO

## 2022-12-04 ENCOUNTER — Observation Stay (HOSPITAL_COMMUNITY): Payer: Medicare HMO

## 2022-12-04 DIAGNOSIS — J90 Pleural effusion, not elsewhere classified: Secondary | ICD-10-CM | POA: Diagnosis not present

## 2022-12-04 DIAGNOSIS — I77810 Thoracic aortic ectasia: Secondary | ICD-10-CM | POA: Diagnosis not present

## 2022-12-04 DIAGNOSIS — E669 Obesity, unspecified: Secondary | ICD-10-CM | POA: Diagnosis present

## 2022-12-04 DIAGNOSIS — R918 Other nonspecific abnormal finding of lung field: Secondary | ICD-10-CM | POA: Diagnosis not present

## 2022-12-04 DIAGNOSIS — I5031 Acute diastolic (congestive) heart failure: Secondary | ICD-10-CM | POA: Diagnosis not present

## 2022-12-04 DIAGNOSIS — J449 Chronic obstructive pulmonary disease, unspecified: Secondary | ICD-10-CM | POA: Diagnosis present

## 2022-12-04 DIAGNOSIS — Z1152 Encounter for screening for COVID-19: Secondary | ICD-10-CM | POA: Diagnosis not present

## 2022-12-04 DIAGNOSIS — G894 Chronic pain syndrome: Secondary | ICD-10-CM | POA: Diagnosis present

## 2022-12-04 DIAGNOSIS — E1142 Type 2 diabetes mellitus with diabetic polyneuropathy: Secondary | ICD-10-CM | POA: Diagnosis present

## 2022-12-04 DIAGNOSIS — I480 Paroxysmal atrial fibrillation: Secondary | ICD-10-CM | POA: Diagnosis present

## 2022-12-04 DIAGNOSIS — E1122 Type 2 diabetes mellitus with diabetic chronic kidney disease: Secondary | ICD-10-CM | POA: Diagnosis present

## 2022-12-04 DIAGNOSIS — I2510 Atherosclerotic heart disease of native coronary artery without angina pectoris: Secondary | ICD-10-CM | POA: Diagnosis present

## 2022-12-04 DIAGNOSIS — J9601 Acute respiratory failure with hypoxia: Secondary | ICD-10-CM | POA: Diagnosis not present

## 2022-12-04 DIAGNOSIS — I482 Chronic atrial fibrillation, unspecified: Secondary | ICD-10-CM | POA: Diagnosis present

## 2022-12-04 DIAGNOSIS — L304 Erythema intertrigo: Secondary | ICD-10-CM | POA: Diagnosis present

## 2022-12-04 DIAGNOSIS — R0902 Hypoxemia: Secondary | ICD-10-CM | POA: Diagnosis not present

## 2022-12-04 DIAGNOSIS — L301 Dyshidrosis [pompholyx]: Secondary | ICD-10-CM | POA: Diagnosis present

## 2022-12-04 DIAGNOSIS — G4730 Sleep apnea, unspecified: Secondary | ICD-10-CM | POA: Diagnosis present

## 2022-12-04 DIAGNOSIS — Z981 Arthrodesis status: Secondary | ICD-10-CM | POA: Diagnosis not present

## 2022-12-04 DIAGNOSIS — E1151 Type 2 diabetes mellitus with diabetic peripheral angiopathy without gangrene: Secondary | ICD-10-CM | POA: Diagnosis present

## 2022-12-04 DIAGNOSIS — M545 Low back pain, unspecified: Secondary | ICD-10-CM | POA: Diagnosis present

## 2022-12-04 DIAGNOSIS — M5116 Intervertebral disc disorders with radiculopathy, lumbar region: Secondary | ICD-10-CM | POA: Diagnosis present

## 2022-12-04 DIAGNOSIS — I472 Ventricular tachycardia, unspecified: Secondary | ICD-10-CM | POA: Diagnosis not present

## 2022-12-04 DIAGNOSIS — I130 Hypertensive heart and chronic kidney disease with heart failure and stage 1 through stage 4 chronic kidney disease, or unspecified chronic kidney disease: Secondary | ICD-10-CM | POA: Diagnosis present

## 2022-12-04 DIAGNOSIS — I5032 Chronic diastolic (congestive) heart failure: Secondary | ICD-10-CM | POA: Diagnosis present

## 2022-12-04 DIAGNOSIS — M48062 Spinal stenosis, lumbar region with neurogenic claudication: Secondary | ICD-10-CM | POA: Diagnosis present

## 2022-12-04 DIAGNOSIS — R059 Cough, unspecified: Secondary | ICD-10-CM | POA: Diagnosis not present

## 2022-12-04 DIAGNOSIS — N1832 Chronic kidney disease, stage 3b: Secondary | ICD-10-CM | POA: Diagnosis present

## 2022-12-04 DIAGNOSIS — E782 Mixed hyperlipidemia: Secondary | ICD-10-CM | POA: Diagnosis present

## 2022-12-04 DIAGNOSIS — F17210 Nicotine dependence, cigarettes, uncomplicated: Secondary | ICD-10-CM | POA: Diagnosis present

## 2022-12-04 DIAGNOSIS — M5442 Lumbago with sciatica, left side: Secondary | ICD-10-CM | POA: Diagnosis not present

## 2022-12-04 LAB — BLOOD GAS, ARTERIAL
Acid-base deficit: 1.3 mmol/L (ref 0.0–2.0)
Bicarbonate: 24.3 mmol/L (ref 20.0–28.0)
O2 Saturation: 95 %
Patient temperature: 36.8
pCO2 arterial: 43 mmHg (ref 32–48)
pH, Arterial: 7.36 (ref 7.35–7.45)
pO2, Arterial: 72 mmHg — ABNORMAL LOW (ref 83–108)

## 2022-12-04 LAB — COMPREHENSIVE METABOLIC PANEL
ALT: 20 U/L (ref 0–44)
AST: 41 U/L (ref 15–41)
Albumin: 2.8 g/dL — ABNORMAL LOW (ref 3.5–5.0)
Alkaline Phosphatase: 66 U/L (ref 38–126)
Anion gap: 10 (ref 5–15)
BUN: 19 mg/dL (ref 8–23)
CO2: 22 mmol/L (ref 22–32)
Calcium: 9.9 mg/dL (ref 8.9–10.3)
Chloride: 99 mmol/L (ref 98–111)
Creatinine, Ser: 1.26 mg/dL — ABNORMAL HIGH (ref 0.44–1.00)
GFR, Estimated: 44 mL/min — ABNORMAL LOW (ref >60)
Glucose, Bld: 188 mg/dL — ABNORMAL HIGH (ref 70–99)
Potassium: 5.9 mmol/L — ABNORMAL HIGH (ref 3.5–5.1)
Sodium: 131 mmol/L — ABNORMAL LOW (ref 135–145)
Total Bilirubin: 1 mg/dL (ref 0.3–1.2)
Total Protein: 5.8 g/dL — ABNORMAL LOW (ref 6.5–8.1)

## 2022-12-04 LAB — BASIC METABOLIC PANEL
Anion gap: 11 (ref 5–15)
BUN: 24 mg/dL — ABNORMAL HIGH (ref 8–23)
CO2: 26 mmol/L (ref 22–32)
Calcium: 10.2 mg/dL (ref 8.9–10.3)
Chloride: 97 mmol/L — ABNORMAL LOW (ref 98–111)
Creatinine, Ser: 1.39 mg/dL — ABNORMAL HIGH (ref 0.44–1.00)
GFR, Estimated: 39 mL/min — ABNORMAL LOW (ref >60)
Glucose, Bld: 90 mg/dL (ref 70–99)
Potassium: 4.2 mmol/L (ref 3.5–5.1)
Sodium: 134 mmol/L — ABNORMAL LOW (ref 135–145)

## 2022-12-04 LAB — GLUCOSE, CAPILLARY
Glucose-Capillary: 163 mg/dL — ABNORMAL HIGH (ref 70–99)
Glucose-Capillary: 150 mg/dL — ABNORMAL HIGH (ref 70–99)
Glucose-Capillary: 159 mg/dL — ABNORMAL HIGH (ref 70–99)
Glucose-Capillary: 98 mg/dL (ref 70–99)

## 2022-12-04 LAB — CBC
HCT: 39.8 % (ref 36.0–46.0)
Hemoglobin: 12.7 g/dL (ref 12.0–15.0)
MCH: 28.5 pg (ref 26.0–34.0)
MCHC: 31.9 g/dL (ref 30.0–36.0)
MCV: 89.2 fL (ref 80.0–100.0)
Platelets: 187 10*3/uL (ref 150–400)
RBC: 4.46 MIL/uL (ref 3.87–5.11)
RDW: 15 % (ref 11.5–15.5)
WBC: 5.6 10*3/uL (ref 4.0–10.5)
nRBC: 0 % (ref 0.0–0.2)

## 2022-12-04 LAB — ECHOCARDIOGRAM COMPLETE
Area-P 1/2: 3.93 cm2
Height: 56 in
S' Lateral: 3.2 cm
Weight: 2447.99 oz

## 2022-12-04 LAB — PROCALCITONIN: Procalcitonin: <0.1 ng/mL

## 2022-12-04 LAB — TROPONIN I (HIGH SENSITIVITY)
Troponin I (High Sensitivity): 7 ng/L (ref <18)
Troponin I (High Sensitivity): 7 ng/L (ref <18)

## 2022-12-04 LAB — SARS CORONAVIRUS 2 BY RT PCR: SARS Coronavirus 2 by RT PCR: NEGATIVE

## 2022-12-04 LAB — LACTIC ACID, PLASMA
Lactic Acid, Venous: 1.1 mmol/L (ref 0.5–1.9)
Lactic Acid, Venous: 1 mmol/L (ref 0.5–1.9)

## 2022-12-04 LAB — BRAIN NATRIURETIC PEPTIDE: B Natriuretic Peptide: 271.1 pg/mL — ABNORMAL HIGH (ref 0.0–100.0)

## 2022-12-04 MED ORDER — GERHARDT'S BUTT CREAM
TOPICAL_CREAM | Freq: Three times a day (TID) | CUTANEOUS | Status: DC
  Administered 2022-12-06 (×2): 1 via TOPICAL
  Filled 2022-12-04: qty 1

## 2022-12-04 MED ORDER — FUROSEMIDE 10 MG/ML IJ SOLN
INTRAMUSCULAR | Status: AC
  Administered 2022-12-04: 20 mg
  Filled 2022-12-04: qty 4

## 2022-12-04 MED ORDER — FUROSEMIDE 10 MG/ML IJ SOLN
40.0000 mg | Freq: Two times a day (BID) | INTRAMUSCULAR | Status: DC
  Administered 2022-12-04 – 2022-12-06 (×4): 40 mg via INTRAVENOUS
  Filled 2022-12-04 (×4): qty 4

## 2022-12-04 MED ORDER — FUROSEMIDE 10 MG/ML IJ SOLN
40.0000 mg | Freq: Once | INTRAMUSCULAR | Status: AC
  Administered 2022-12-04: 40 mg via INTRAVENOUS

## 2022-12-04 MED ORDER — TRIAMCINOLONE ACETONIDE 0.5 % EX CREA
TOPICAL_CREAM | CUTANEOUS | Status: DC
  Administered 2022-12-07: 1 via TOPICAL
  Filled 2022-12-04: qty 15

## 2022-12-04 NOTE — Progress Notes (Signed)
[facility] Laban Emperor informed that patient in Yellow MEW HR 102-114 and LOC with Gabapentin 600 mg scheduled tonight. New order to hold Gabapentin tomnight

## 2022-12-04 NOTE — Plan of Care (Signed)
  Problem: Nutrition: Goal: Adequate nutrition will be maintained Outcome: Progressing   Problem: Coping: Goal: Level of anxiety will decrease Outcome: Progressing   Problem: Safety: Goal: Ability to remain free from injury will improve Outcome: Progressing   Problem: Skin Integrity: Goal: Risk for impaired skin integrity will decrease Outcome: Progressing   Problem: Coping: Goal: Ability to adjust to condition or change in health will improve Outcome: Progressing

## 2022-12-04 NOTE — Significant Event (Addendum)
[facility] Night coverage note:  Per RN: "Pt admitted for low back pain. Pt sounds so wet this morning. I found her with her mouth foaming, desats to 88%. Has to put O2. This is an acute change from when I checked her vitals around 0400. FYI"  No change in mental status, no CP, no IVF running.  Vitals:   12/04/22 0402 12/04/22 0507  BP: 108/66 110/74  Pulse: (!) 109 94  Resp: 20 (!) 24  Temp: 97.6 F (36.4 C) 97.8 F (36.6 C)  SpO2: 92% 96%   Now satting 90% on 3L  On bedside exam: Pt with very wet breath sounds / crackles that are audible from hall way. Pt denies pain, says she can't breath, does seem to be in respiratory distress at this point. Tachycardic with HR 110-115 Has maybe 1+ BLE edema. Coughing up white frothy sputum.  Check CXR Check CBC, CMP, BNP, procalcitonin, lactate. Check EKG looks like a.fib RVR with rate of 112 on monitor Check stat ABG Trying rescue BIPAP while waiting on above to come back. Most likely DDx for her respiratory failure at this point = PNA vs CHF.  Update: Not tolerating BIPAP Ordering 40mg  IV lasix x1 empirically given work of breathing, secretions.  Update 2: Lasix appears to have worked, pt breathing improved significantly. Breath sounds less wet, coughing up less. Pt significantly more awake, alert: states her breathing is significantly improved at this time.  Suspicious for new onset CHF, perhaps partly in setting of fluid retention due to steroids.  But rapid onset is a bit more concerning.  EKG still pending at this time (RN having trouble getting machine to pick up), but with no CP (pt clearly denies this), and no obvious changes that I can see on the screen, doubt STEMI. Will order 2D echo Will order trop x 2 Strict intake and output Remainder of labs pending.  CRITICAL CARE Performed by: Hillary Bow.   Total critical care time: 70 minutes ([id number])  Critical care time was exclusive of separately billable procedures  and treating other patients.  Critical care was necessary to treat or prevent imminent or life-threatening deterioration.  Critical care was time spent personally by me on the following activities: development of treatment plan with patient and/or surrogate as well as nursing, discussions with consultants, evaluation of patient's response to treatment, examination of patient, obtaining history from patient or surrogate, ordering and performing treatments and interventions, ordering and review of laboratory studies, ordering and review of radiographic studies, pulse oximetry and re-evaluation of patient's condition.

## 2022-12-04 NOTE — Significant Event (Signed)
This RN found the pt with her mouth foaming at around 0500. Pt lung sounds audibly wet even from the hallway. Pt is complaining of shortness of breath, O2 initially placed at 2L but pt sats sustains at 88%, increased to 3L and pt sats 91-94%. Pt has been room air prior to this event. Pt continues to have intermittent episodes of white foam secretions, suction set up in place. Rapid response consulted and came at pts bedside. Provider informed and came at pts bedside, orders placed and carried out. Respiratory therapists at bedside. Pt showed improvement after IV lasix. Will continue to monitor pt.    12/04/22 0507  Vitals  Temp 97.8 F (36.6 C)  Temp Source Oral  BP 110/74  MAP (mmHg) 87  BP Location Left Arm  BP Method Automatic  Patient Position (if appropriate) Lying  Pulse Rate 94  Pulse Rate Source Monitor  Resp (!) 24  MEWS COLOR  MEWS Score Color Green  Oxygen Therapy  SpO2 96 %  O2 Device Nasal Cannula  O2 Flow Rate (L/min) 3 L/min  MEWS Score  MEWS Temp 0  MEWS Systolic 0  MEWS Pulse 0  MEWS RR 1  MEWS LOC 0  MEWS Score 1

## 2022-12-04 NOTE — Progress Notes (Signed)
HOSPITALIST ROUNDING NOTE Lisa Maxwell WUJ:[id number]  DOB: [birth year]  DOA: 11/30/2022  PCP: Paulina Fusi, MD  12/04/2022,1:23 PM   LOS: 0 days      Code Status: Full code   From:  Home current Dispo: Likely skilled facility     69Y female, BMI 34 moderate obesity A-fib/Eliquis CHADVASC >4,  Saddle embolus + shock + mechanical thrombectomy 11/2021 admission  Tertiary contractions with oropharyngeal dysphagia-EGD = dilatation Prior L1 fracture 06/2022 placed in TLSO brace at that time on admission DM TY 2 Polypoid left nasal lesion follows with ENT Chronic low back pain prior cauda equina status post L2-L3 decompression Dr. Rolanda Jay 08/28/2021--Chronic low back pain + sciatica + chronic spinal cord stimulator  8/26 worsening acute/chronic LLE pain with L1-L3 neuropathy Neurosurgery evaluated and discussed with the ED and patient was started on eventual Medrol Dosepak for new T2 hyperintense left psoas signal + L1-L2 hyperintense signal 1.3X 1.2 cm that was felt to be noninfectious by Dr. Dutch Quint 8/30 for respiratory decompensation = CXR = no acute cardiopulmonary disease with chronic bronchial wall thickening  Plan  Acute hypoxic respiratory failure Probably combination of iatrogenic volume +/- underlying COPD Echo ordered 8/30 a.m. and pending-she seems to have responded well overnight to Lasix 40 given x 1 Diuresis 40 twice daily IV Lasix--WBC has never been elevated-I suspect less likely a pneumonia Get two-view x-ray in a.m.  Chronic A-fib CHADVASC >4/Eliquis Not on any rate control-monitor Last ECHO EF 60-65% and being updated today  Prior saddle embolus 11/2021 + thrombectomy and complicated hospitalization Continue Eliquis lifelong  DM TY 2 A1c around 6.1 CBG trend 1 50 through 1 80 Continue sliding scale sensitive, watch sugars as is on steroids  Underlying prior dysphagia status post esophageal dilatation 06/2022 She does not cough while eating, she has no fever-only  monitor at this time-continue regular diet-continue PPI Protonix 40 daily  Chronic low back pain Continue Oxy IR 10 every 4 as needed pain watch mentation continue Robaxin 250 3 times daily, gabapentin 600 at bedtime  Bilateral intertrigo Care heart screen as per wound nurse  LUTS-Foley placed 8/27 Has Foley catheter in place-attempt voiding trial-if no sensation to void will need outpatient reevaluation ?  Chronic UTIs-is on trimethoprim 100--outpatient reeval with urology  DVT prophylaxis: Eliquis  Status is: Inpatient Remains inpatient appropriate because:   Requires hemodynamic stability for likely discharge to skilled facility late this weekend versus 9/2    Subjective: Events noted overnight, patient short of breath yesterday but seems to worsened and was given Lasix I am seeing her at the bedside she seems quite comfortable, her husband is present and he understands plan  Objective + exam Vitals:   12/04/22 0829 12/04/22 0837 12/04/22 1200 12/04/22 1248  BP: 115/70  116/74   Pulse: 82  90   Resp: 16  18   Temp: 97.7 F (36.5 C)  98.5 F (36.9 C)   TempSrc: Axillary  Oral   SpO2: 99% 96%  94%  Weight:      Height:       Filed Weights   12/01/22 0500 12/03/22 0552 12/04/22 0424  Weight: 73.3 kg 71.8 kg 69.4 kg    Examination:  Thick neck Mallampati 4 looks about stated age Poor dentition On oxygen 10 L high flow No wheeze rales rhonchi S1-S2 seems to be in A-fib with PVCs Abdomen obese nontender no rebound Skin not examined today Neuro intact coherent cognizant  Data Reviewed: reviewed   CBC  Component Value Date/Time   WBC 5.6 12/04/2022 0543   RBC 4.46 12/04/2022 0543   HGB 12.7 12/04/2022 0543   HCT 39.8 12/04/2022 0543   PLT 187 12/04/2022 0543   MCV 89.2 12/04/2022 0543   MCH 28.5 12/04/2022 0543   MCHC 31.9 12/04/2022 0543   RDW 15.0 12/04/2022 0543   LYMPHSABS 1.3 12/01/2022 0339   MONOABS 0.6 12/01/2022 0339   EOSABS 0.1 12/01/2022  0339   BASOSABS 0.0 12/01/2022 0339      Latest Ref Rng & Units 12/04/2022    5:43 AM 12/03/2022    2:54 AM 12/01/2022    3:39 AM  CMP  Glucose 70 - 99 mg/dL 782  956  213   BUN 8 - 23 mg/dL 19  14  15    Creatinine 0.44 - 1.00 mg/dL 0.86  5.78  4.69   Sodium 135 - 145 mmol/L 131  132  136   Potassium 3.5 - 5.1 mmol/L 5.9  4.2  4.2   Chloride 98 - 111 mmol/L 99  103  105   CO2 22 - 32 mmol/L 22  24  24    Calcium 8.9 - 10.3 mg/dL 9.9  9.3  9.5   Total Protein 6.5 - 8.1 g/dL 5.8  5.2  4.9   Total Bilirubin 0.3 - 1.2 mg/dL 1.0  0.5  0.5   Alkaline Phos 38 - 126 U/L 66  55  51   AST 15 - 41 U/L 41  27  22   ALT 0 - 44 U/L 20  16  15       Scheduled Meds:  apixaban  5 mg Oral BID   bethanechol  50 mg Oral TID   Chlorhexidine Gluconate Cloth  6 each Topical Daily   docusate sodium  100 mg Oral Daily   fluticasone furoate-vilanterol  1 puff Inhalation Daily   gabapentin  600 mg Oral QHS   Gerhardt's butt cream   Topical TID   insulin aspart  0-9 Units Subcutaneous TID WC   latanoprost  1 drop Both Eyes QHS   lidocaine  1 patch Transdermal Q24H   magnesium gluconate  500 mg Oral BID   methocarbamol  250 mg Oral TID   methylPREDNISolone  4 mg Oral 3 x daily with food   [START ON 12/05/2022] methylPREDNISolone  4 mg Oral 4X daily taper   methylPREDNISolone  8 mg Oral Nightly   pantoprazole  40 mg Oral Daily   pravastatin  40 mg Oral q1800   tamsulosin  0.4 mg Oral BID   triamcinolone cream   Topical Once per day on Monday Thursday   trimethoprim  100 mg Oral Daily   Continuous Infusions:  Time 55 minutes  Rhetta Mura, MD  [facility]

## 2022-12-04 NOTE — Progress Notes (Signed)
She coughs so harshly that it almost gags her and she vomits,.  Notified Dr Mahala Menghini and he said she has effusions and some rhabdo---she has also had a dilatation prior so he will possibly order a barium swallow. HOB up.  Denies pain at this time.

## 2022-12-04 NOTE — Progress Notes (Signed)
Per order, clamped foley for over an hour and she had no sensation to void. Unclamped now.  Pt said she was having the same problems at home prior to coming to the hospital and that is why she was being seen by a urologist.  They placed a foley before she came to hospital but it was changed here because the seal was broken.  She said she is supposed to go back to the urologist for a follow up appointment.

## 2022-12-04 NOTE — Progress Notes (Signed)
   12/04/22 2021  BiPAP/CPAP/SIPAP  $ Non-Invasive Ventilator  Non-Invasive Vent Subsequent  BiPAP/CPAP/SIPAP Pt Type Adult  BiPAP/CPAP/SIPAP SERVO  Mask Type Full face mask  Mask Size Small  Set Rate 15 breaths/min  Respiratory Rate 19 breaths/min  IPAP 15 cmH20  EPAP 5 cmH2O  PEEP 5 cmH20  FiO2 (%) 45 %  Minute Ventilation 8.1  Leak 55  Peak Inspiratory Pressure (PIP) 17  Tidal Volume (Vt) 440  Patient Home Equipment No  Auto Titrate No  Press High Alarm 25 cmH2O  Nasal massage performed Yes  CPAP/SIPAP surface wiped down Yes  BiPAP/CPAP /SiPAP Vitals  Pulse Rate (!) 114  Resp 19  SpO2 94 %  MEWS Score/Color  MEWS Score 2  MEWS Score Color Yellow

## 2022-12-04 NOTE — TOC Progression Note (Signed)
Transition of Care [facility]) - Progression Note    Patient Details  Name: Lisa Maxwell MRN: [id number] Date of Birth: 04-Aug-1945  Transition of Care [facility] CM/SW Contact  Baldemar Lenis, [place] Phone Number: 12/04/2022, 9:45 AM  Clinical Narrative:   CSW updated by medical team that patient had acute events overnight, no longer medically stable for discharge to SNF today. CSW contacted [facility], they can admit over the weekend if patient is stable. Authorization is good through 9/3. CSW to follow.    Expected Discharge Plan: Skilled Nursing Facility Barriers to Discharge: Continued Medical Work up, English as a second language teacher  Expected Discharge Plan and Services     Post Acute Care Choice: Skilled Nursing Facility Living arrangements for the past 2 months: Single Family Home                                       Social Determinants of Health (SDOH) Interventions SDOH Screenings   Food Insecurity: No Food Insecurity (12/01/2022)  Housing: Low Risk  (12/01/2022)  Transportation Needs: No Transportation Needs (12/01/2022)  Utilities: Not At Risk (12/01/2022)  Alcohol Screen: Low Risk  (07/31/2022)  Depression (PHQ2-9): Low Risk  (07/31/2022)  Financial Resource Strain: Low Risk  (07/31/2022)  Physical Activity: Unknown (07/31/2022)  Tobacco Use: High Risk (12/01/2022)    Readmission Risk Interventions     No data to display

## 2022-12-04 NOTE — Consult Note (Addendum)
WOC Nurse Consult Note: Reason for Consult: groin red  Wound type: 1.  Intertriginous dermatitis groin and underneath pannus  ICD-10 CM Codes for Irritant Dermatitis L24A0 - Due to friction or contact with body fluids; unspecified  L30.4  - Erythema intertrigo. Also used for abrasion of the hand, chafing of the skin, dermatitis due to sweating and friction, friction dermatitis, friction eczema, and genital/thigh intertrigo.  2. Multiple full thickness lesions to bilateral feet noted, patient states these are from a rash that she has been prescribed Triamcinolone cream for  Pressure Injury POA: NA  Measurement: 1. Widespread erythema and partial thickness skin loss noted underneath pannus and in groin/inner thigh area  2.  Multiple small 0.3 x 0.3 cm areas of full and partial thickness skin loss to bilateral feet, scaling and hyperkeratotic skin  noted as well  Wound bed: 100% pink and moist pannus and groin  Drainage (amount, consistency, odor) minimal serosanguinous to groin/pannus  Periwound: Dressing procedure/placement/frequency: Clean bilateral groin and underneath pannus with soap and water, dry thoroughly.  Apply Gerhardt's Butt Cream  to buttocks/groin and inner thighs 3 times a day and prn soiling.  Sprinkle over Gerhardt's with floor stock antifungal powder (Microguard-green label) with each application.    Place Interdry AG underneath pannus:  Order [employer] [place] # [phone] Measure and cut length of InterDry to fit in skin folds that have skin breakdown Tuck InterDry fabric into skin folds in a single layer, allow for 2 inches of overhang from skin edges to allow for wicking to occur May remove to bathe; dry area thoroughly and then tuck into affected areas again Do not apply any creams or ointments when using InterDry DO NOT THROW AWAY FOR 5 DAYS unless soiled with stool DO NOT [facility] product, this will inactivate the silver in the material  New sheet of Interdry should be applied after 5 days  of use if patient continues to have skin breakdown   POC discussed with patient and bedside nurse. WOC team will not follow at this time, Re-consult if further needs arise    Per H&P Triamcinolone is to be applied to feet 2 times a week.  Will order as such and she should follow-up with prescriber at discharge.    Thank you,    Priscella Mann MSN, RN-BC, [employer] [phone]

## 2022-12-04 NOTE — Progress Notes (Signed)
  Echocardiogram 2D Echocardiogram has been performed.  Leda Roys RDCS 12/04/2022, 2:42 PM

## 2022-12-05 ENCOUNTER — Other Ambulatory Visit: Payer: Self-pay | Admitting: Family Medicine

## 2022-12-05 ENCOUNTER — Inpatient Hospital Stay (HOSPITAL_COMMUNITY): Payer: Medicare HMO

## 2022-12-05 DIAGNOSIS — M5442 Lumbago with sciatica, left side: Secondary | ICD-10-CM | POA: Diagnosis not present

## 2022-12-05 LAB — BASIC METABOLIC PANEL
Anion gap: 6 (ref 5–15)
BUN: 26 mg/dL — ABNORMAL HIGH (ref 8–23)
CO2: 29 mmol/L (ref 22–32)
Calcium: 10 mg/dL (ref 8.9–10.3)
Chloride: 99 mmol/L (ref 98–111)
Creatinine, Ser: 1.42 mg/dL — ABNORMAL HIGH (ref 0.44–1.00)
GFR, Estimated: 38 mL/min — ABNORMAL LOW (ref >60)
Glucose, Bld: 141 mg/dL — ABNORMAL HIGH (ref 70–99)
Potassium: 4.6 mmol/L (ref 3.5–5.1)
Sodium: 134 mmol/L — ABNORMAL LOW (ref 135–145)

## 2022-12-05 LAB — GLUCOSE, CAPILLARY
Glucose-Capillary: 127 mg/dL — ABNORMAL HIGH (ref 70–99)
Glucose-Capillary: 214 mg/dL — ABNORMAL HIGH (ref 70–99)
Glucose-Capillary: 241 mg/dL — ABNORMAL HIGH (ref 70–99)
Glucose-Capillary: 209 mg/dL — ABNORMAL HIGH (ref 70–99)
Glucose-Capillary: 193 mg/dL — ABNORMAL HIGH (ref 70–99)

## 2022-12-05 MED ORDER — PROMETHAZINE HCL 12.5 MG PO TABS: 12.5000 mg | ORAL_TABLET | Freq: Four times a day (QID) | ORAL | 0 refills | Status: DC | PRN

## 2022-12-05 MED ORDER — METHOCARBAMOL 500 MG PO TABS: 500.0000 mg | ORAL_TABLET | Freq: Two times a day (BID) | ORAL | 0 refills | Status: DC | PRN

## 2022-12-05 NOTE — Progress Notes (Signed)
Pt taken off BIPAP and placed on 3L [place] per DO request. Pt is resting comfortably, vitals stable, and is in no distress.

## 2022-12-05 NOTE — Plan of Care (Signed)

## 2022-12-05 NOTE — Plan of Care (Signed)
  Problem: Education: Goal: Knowledge of General Education information will improve Description: Including pain rating scale, medication(s)/side effects and non-pharmacologic comfort measures 12/05/2022 0652 by Anda Kraft, RN Outcome: Progressing 12/05/2022 0652 by Anda Kraft, RN Outcome: Progressing   Problem: Health Behavior/Discharge Planning: Goal: Ability to manage health-related needs will improve 12/05/2022 0652 by Anda Kraft, RN Outcome: Progressing 12/05/2022 0652 by Anda Kraft, RN Outcome: Progressing   Problem: Clinical Measurements: Goal: Ability to maintain clinical measurements within normal limits will improve 12/05/2022 0652 by Anda Kraft, RN Outcome: Progressing 12/05/2022 0652 by Anda Kraft, RN Outcome: Progressing Goal: Will remain free from infection 12/05/2022 0652 by Anda Kraft, RN Outcome: Progressing 12/05/2022 0652 by Anda Kraft, RN Outcome: Progressing Goal: Diagnostic test results will improve 12/05/2022 0652 by Anda Kraft, RN Outcome: Progressing 12/05/2022 0652 by Anda Kraft, RN Outcome: Progressing Goal: Respiratory complications will improve Outcome: Progressing Goal: Cardiovascular complication will be avoided Outcome: Progressing   Problem: Activity: Goal: Risk for activity intolerance will decrease Outcome: Progressing   Problem: Nutrition: Goal: Adequate nutrition will be maintained Outcome: Progressing   Problem: Coping: Goal: Level of anxiety will decrease Outcome: Progressing   Problem: Elimination: Goal: Will not experience complications related to bowel motility Outcome: Progressing Goal: Will not experience complications related to urinary retention Outcome: Progressing   Problem: Pain Managment: Goal: General experience of comfort will improve Outcome: Progressing   Problem: Safety: Goal: Ability to remain free from injury will improve Outcome: Progressing   Problem: Skin Integrity: Goal: Risk  for impaired skin integrity will decrease Outcome: Progressing   Problem: Education: Goal: Ability to describe self-care measures that may prevent or decrease complications (Diabetes Survival Skills Education) will improve Outcome: Progressing Goal: Individualized Educational Video(s) Outcome: Progressing   Problem: Coping: Goal: Ability to adjust to condition or change in health will improve Outcome: Progressing   Problem: Fluid Volume: Goal: Ability to maintain a balanced intake and output will improve Outcome: Progressing   Problem: Health Behavior/Discharge Planning: Goal: Ability to identify and utilize available resources and services will improve Outcome: Progressing Goal: Ability to manage health-related needs will improve Outcome: Progressing   Problem: Metabolic: Goal: Ability to maintain appropriate glucose levels will improve Outcome: Progressing   Problem: Nutritional: Goal: Maintenance of adequate nutrition will improve Outcome: Progressing Goal: Progress toward achieving an optimal weight will improve Outcome: Progressing   Problem: Skin Integrity: Goal: Risk for impaired skin integrity will decrease Outcome: Progressing   Problem: Tissue Perfusion: Goal: Adequacy of tissue perfusion will improve Outcome: Progressing

## 2022-12-05 NOTE — Progress Notes (Addendum)
HOSPITALIST ROUNDING NOTE Lisa Maxwell [id number]  DOB: 20-Oct-1945  DOA: 11/30/2022  PCP: Paulina Fusi, MD  12/05/2022,8:22 AM   LOS: 1 day      Code Status: Full code   From:  Home current Dispo: Likely skilled facility     73Y female, BMI 34 moderate obesity A-fib/Eliquis CHADVASC >4,  Saddle embolus + shock + mechanical thrombectomy 11/2021 admission  Tertiary contractions with oropharyngeal dysphagia-EGD = dilatation Prior L1 fracture 06/2022 placed in TLSO brace at that time on admission DM TY 2 Polypoid left nasal lesion follows with ENT Chronic low back pain prior cauda equina status post L2-L3 decompression Dr. Rolanda Jay 08/28/2021--Chronic low back pain + sciatica + chronic spinal cord stimulator  8/26 worsening acute/chronic LLE pain with L1-L3 neuropathy Neurosurgery evaluated and discussed with the ED and patient was started on eventual Medrol Dosepak for new T2 hyperintense left psoas signal + L1-L2 hyperintense signal 1.3X 1.2 cm that was felt to be noninfectious by Dr. Dutch Quint 8/30 for respiratory decompensation = CXR = no acute cardiopulmonary disease with chronic bronchial wall thickening--- barium swallow was ordered but cannot be completed on 8/30 so we are attempting to do this on 8/31   Plan  Acute hypoxic respiratory failure iatrogenic volume +/- underlying COPD--- also some underlying dysphagia previously Echo ordered 8/30 a.m. and pending-she seems to have responded well overnight to Lasix 40 given x 1 Diuresis 40 twice daily IV Lasix--WBC has never been elevated-I suspect less likely a pneumonia Waiting on two-view x-ray from this morning 8/31  Underlying prior dysphagia status post esophageal dilatation 06/2022 Vomited X2 yesterday chicken noodle soup-barium swallow pending Attempt diet today If barium swallow is positive, discussed with Dr. Barron Alvine on 8/30 that we may need to do an endoscopy for savory dilatation He is aware if the barium swallow turns  out to be positive Continue diet as best tolerated  Chronic A-fib CHADVASC >4/Eliquis Not on any rate control-monitor Last ECHO EF 60-65% and being updated today  Prior saddle embolus 11/2021 + thrombectomy and complicated hospitalization Continue Eliquis lifelong  DM TY 2 A1c around 6.1 CBG 130's Continue sliding scale sensitive, watch sugars as is on steroids  Chronic low back pain Continue Oxy IR 10 every 4 as needed pain watch mentation continue Robaxin 250 3 times daily, gabapentin 600 at bedtime Discussed the case with Dr. Conchita Paris previously 8/29 and patient was started on Medrol Dosepak which is tapering off  Bilateral intertrigo Care heart screen as per wound nurse  LUTS-Foley placed 8/27 Has Foley catheter in place-attempt voiding trial-if no sensation to void will need outpatient reevaluation Stop trimethoprim at this time  DVT prophylaxis: Eliquis  Status is: Inpatient Remains inpatient appropriate because:   Requires hemodynamic stability for likely discharge to skilled facility late this weekend versus 9/2    Subjective:  Vomited X2 after some chicken noodle soup earlier today No chest pain no fever Still a little bit of cough Does feel a little bit better overall-has not had breakfast so do not know if nauseous  Objective + exam Vitals:   12/05/22 0027 12/05/22 0516 12/05/22 0745 12/05/22 0810  BP: 131/75 130/70 121/83   Pulse: 65 97 (!) 104 (!) 102  Resp: 15 16  17   Temp: 98 F (36.7 C) 99 F (37.2 C) 98 F (36.7 C)   TempSrc:  Axillary Oral   SpO2: 97% 98% 97% 96%  Weight:      Height:       Filed  Weights   12/01/22 0500 12/03/22 0552 12/04/22 0424  Weight: 73.3 kg 71.8 kg 69.4 kg    Examination:  Moderate dentition EOMI NCAT Neck is soft supple S1-S2 A-fib rate around 100 Chest is clear without wheeze rales or rhonchi ROM is intact Abdomen is soft Trace lower extremity edema  Data Reviewed: reviewed   CBC    Component Value  Date/Time   WBC 5.6 12/04/2022 0543   RBC 4.46 12/04/2022 0543   HGB 12.7 12/04/2022 0543   HCT 39.8 12/04/2022 0543   PLT 187 12/04/2022 0543   MCV 89.2 12/04/2022 0543   MCH 28.5 12/04/2022 0543   MCHC 31.9 12/04/2022 0543   RDW 15.0 12/04/2022 0543   LYMPHSABS 1.3 12/01/2022 0339   MONOABS 0.6 12/01/2022 0339   EOSABS 0.1 12/01/2022 0339   BASOSABS 0.0 12/01/2022 0339      Latest Ref Rng & Units 12/05/2022    5:37 AM 12/04/2022    9:00 PM 12/04/2022    5:43 AM  CMP  Glucose 70 - 99 mg/dL 440  90  102   BUN 8 - 23 mg/dL 26  24  19    Creatinine 0.44 - 1.00 mg/dL 7.25  3.66  4.40   Sodium 135 - 145 mmol/L 134  134  131   Potassium 3.5 - 5.1 mmol/L 4.6  4.2  5.9   Chloride 98 - 111 mmol/L 99  97  99   CO2 22 - 32 mmol/L 29  26  22    Calcium 8.9 - 10.3 mg/dL 34.7  42.5  9.9   Total Protein 6.5 - 8.1 g/dL   5.8   Total Bilirubin 0.3 - 1.2 mg/dL   1.0   Alkaline Phos 38 - 126 U/L   66   AST 15 - 41 U/L   41   ALT 0 - 44 U/L   20      Scheduled Meds:  apixaban  5 mg Oral BID   bethanechol  50 mg Oral TID   Chlorhexidine Gluconate Cloth  6 each Topical Daily   docusate sodium  100 mg Oral Daily   fluticasone furoate-vilanterol  1 puff Inhalation Daily   furosemide  40 mg Intravenous BID   gabapentin  600 mg Oral QHS   Gerhardt's butt cream   Topical TID   insulin aspart  0-9 Units Subcutaneous TID WC   latanoprost  1 drop Both Eyes QHS   lidocaine  1 patch Transdermal Q24H   magnesium gluconate  500 mg Oral BID   methocarbamol  250 mg Oral TID   methylPREDNISolone  4 mg Oral 4X daily taper   pantoprazole  40 mg Oral Daily   pravastatin  40 mg Oral q1800   tamsulosin  0.4 mg Oral BID   triamcinolone cream   Topical Once per day on Monday Thursday   trimethoprim  100 mg Oral Daily   Continuous Infusions:  Time 40 minutes  Rhetta Mura, MD  [facility]

## 2022-12-06 DIAGNOSIS — M5442 Lumbago with sciatica, left side: Secondary | ICD-10-CM | POA: Diagnosis not present

## 2022-12-06 LAB — BASIC METABOLIC PANEL
Anion gap: 9 (ref 5–15)
BUN: 31 mg/dL — ABNORMAL HIGH (ref 8–23)
CO2: 29 mmol/L (ref 22–32)
Calcium: 9.9 mg/dL (ref 8.9–10.3)
Chloride: 96 mmol/L — ABNORMAL LOW (ref 98–111)
Creatinine, Ser: 1.43 mg/dL — ABNORMAL HIGH (ref 0.44–1.00)
GFR, Estimated: 38 mL/min — ABNORMAL LOW (ref >60)
Glucose, Bld: 146 mg/dL — ABNORMAL HIGH (ref 70–99)
Potassium: 4.1 mmol/L (ref 3.5–5.1)
Sodium: 134 mmol/L — ABNORMAL LOW (ref 135–145)

## 2022-12-06 LAB — GLUCOSE, CAPILLARY
Glucose-Capillary: 134 mg/dL — ABNORMAL HIGH (ref 70–99)
Glucose-Capillary: 117 mg/dL — ABNORMAL HIGH (ref 70–99)
Glucose-Capillary: 211 mg/dL — ABNORMAL HIGH (ref 70–99)
Glucose-Capillary: 127 mg/dL — ABNORMAL HIGH (ref 70–99)
Glucose-Capillary: 239 mg/dL — ABNORMAL HIGH (ref 70–99)

## 2022-12-06 LAB — BRAIN NATRIURETIC PEPTIDE: B Natriuretic Peptide: 222.9 pg/mL — ABNORMAL HIGH (ref 0.0–100.0)

## 2022-12-06 MED ORDER — FUROSEMIDE 40 MG PO TABS
40.0000 mg | ORAL_TABLET | Freq: Two times a day (BID) | ORAL | Status: DC
  Administered 2022-12-06 – 2022-12-07 (×2): 40 mg via ORAL
  Filled 2022-12-06 (×2): qty 1

## 2022-12-06 NOTE — Progress Notes (Signed)
HOSPITALIST ROUNDING NOTE Lisa Maxwell:[id number]  DOB: 12/13/1945  DOA: 11/30/2022  PCP: Paulina Fusi, MD  12/06/2022,4:04 PM   LOS: 2 days      Code Status: Full code   From:  Home current Dispo: Likely skilled facility     72Y female, BMI 34 moderate obesity A-fib/Eliquis CHADVASC >4,  Saddle embolus + shock + mechanical thrombectomy 11/2021 admission  Tertiary contractions with oropharyngeal dysphagia-EGD = dilatation Prior L1 fracture 06/2022 placed in TLSO brace at that time on admission DM TY 2 Polypoid left nasal lesion follows with ENT Chronic low back pain prior cauda equina status post L2-L3 decompression Dr. Rolanda Jay 08/28/2021--Chronic low back pain + sciatica + chronic spinal cord stimulator  8/26 worsening acute/chronic LLE pain with L1-L3 neuropathy Neurosurgery evaluated and discussed with the ED and patient was started on eventual Medrol Dosepak for new T2 hyperintense left psoas signal + L1-L2 hyperintense signal 1.3X 1.2 cm that was felt to be noninfectious by Dr. Dutch Quint 8/30 for respiratory decompensation = CXR = no acute cardiopulmonary disease with chronic bronchial wall thickening----echocardiogram EF 55-60% down slightly from 65% previously barium swallow was ordered but HAS NOT BEEN DONE DESPITE PATIENT WAITING MORE THAN 48 hours for this 8/31 CXR 2 Vw--Edema> PNA to my view   Plan  Acute hypoxic respiratory failure iatrogenic volume +/- underlying COPD--- also some underlying dysphagia previously Echo as above Change IV to p.o. 40 ii/day Lasix today WBC has never been elevated-less likely a pneumonia--BNP ^ 222 likely AECHF instead  Underlying prior dysphagia status post esophageal dilatation 06/2022 Vomited X2 yesterday chicken noodle soup-barium swallow pending She has waited indefinitely for a single contrast barium swallow-I discussed with Dr. Barron Alvine on 8/30 need for dilatation  I'm not sure she now needs it No further n/v  Chronic A-fib  CHADVASC >4/Eliquis Not on any rate control-monitor Last ECHO EF 60-65% and being updated today  Prior saddle embolus 11/2021 + thrombectomy and complicated hospitalization Continue Eliquis lifelong  DM TY 2 A1c around 6.1 CBG 110-230 Continue sliding scale sensitive, watch sugars as is on steroids which are tapering  Chronic low back pain Continue Oxy IR 10 every 4 as needed pain watch mentation continue Robaxin 250 3 times daily, gabapentin 600 at bedtime Discussed the case with Dr. Conchita Paris previously 8/29 and patient was started on Medrol Dosepak which is tapering off  Bilateral intertrigo groin re-eval 9/1 look good  LUTS-Foley placed 8/27 Has Foley catheter in place-attempted voiding trial-need outpatient reevaluation Stop trimethoprim at this time--outpatient discussion with her urologist  DVT prophylaxis: Eliquis  Status is: Inpatient Remains inpatient appropriate because:   Transition IV to p.o. Lasix    Subjective:  Well  No new issue--cough better, no vomit, not coughing + meals No cp Overall much improved from prior  Objective + exam Vitals:   12/06/22 0347 12/06/22 0754 12/06/22 0900 12/06/22 1300  BP: 116/62  117/73 127/70  Pulse: 84  (!) 110 (!) 106  Resp:   18 17  Temp: 98.8 F (37.1 C)  98.2 F (36.8 C) 97.6 F (36.4 C)  TempSrc: Oral  Oral Axillary  SpO2: 98% 95% 95% 95%  Weight:      Height:       Filed Weights   12/01/22 0500 12/03/22 0552 12/04/22 0424  Weight: 73.3 kg 71.8 kg 69.4 kg    Examination:  EOMI NCAT no focal deficit No icterus Chest clear no wheeze Abdomen soft Intertriginous areas seem improved Trace lower extremity  edema   Data Reviewed: reviewed   CBC    Component Value Date/Time   WBC 5.6 12/04/2022 0543   RBC 4.46 12/04/2022 0543   HGB 12.7 12/04/2022 0543   HCT 39.8 12/04/2022 0543   PLT 187 12/04/2022 0543   MCV 89.2 12/04/2022 0543   MCH 28.5 12/04/2022 0543   MCHC 31.9 12/04/2022 0543   RDW 15.0  12/04/2022 0543   LYMPHSABS 1.3 12/01/2022 0339   MONOABS 0.6 12/01/2022 0339   EOSABS 0.1 12/01/2022 0339   BASOSABS 0.0 12/01/2022 0339      Latest Ref Rng & Units 12/06/2022    4:33 AM 12/05/2022    5:37 AM 12/04/2022    9:00 PM  CMP  Glucose 70 - 99 mg/dL 782  956  90   BUN 8 - 23 mg/dL 31  26  24    Creatinine 0.44 - 1.00 mg/dL 2.13  0.86  5.78   Sodium 135 - 145 mmol/L 134  134  134   Potassium 3.5 - 5.1 mmol/L 4.1  4.6  4.2   Chloride 98 - 111 mmol/L 96  99  97   CO2 22 - 32 mmol/L 29  29  26    Calcium 8.9 - 10.3 mg/dL 9.9  46.9  62.9      Scheduled Meds:  apixaban  5 mg Oral BID   bethanechol  50 mg Oral TID   Chlorhexidine Gluconate Cloth  6 each Topical Daily   docusate sodium  100 mg Oral Daily   fluticasone furoate-vilanterol  1 puff Inhalation Daily   furosemide  40 mg Intravenous BID   gabapentin  600 mg Oral QHS   Gerhardt's butt cream   Topical TID   insulin aspart  0-9 Units Subcutaneous TID WC   latanoprost  1 drop Both Eyes QHS   lidocaine  1 patch Transdermal Q24H   magnesium gluconate  500 mg Oral BID   methocarbamol  250 mg Oral TID   methylPREDNISolone  4 mg Oral 4X daily taper   pantoprazole  40 mg Oral Daily   pravastatin  40 mg Oral q1800   tamsulosin  0.4 mg Oral BID   triamcinolone cream   Topical Once per day on Monday Thursday   trimethoprim  100 mg Oral Daily   Continuous Infusions:  Time 20 minutes  Rhetta Mura, MD  [facility]

## 2022-12-06 NOTE — Plan of Care (Signed)

## 2022-12-07 DIAGNOSIS — Z7901 Long term (current) use of anticoagulants: Secondary | ICD-10-CM | POA: Diagnosis not present

## 2022-12-07 DIAGNOSIS — R339 Retention of urine, unspecified: Secondary | ICD-10-CM | POA: Diagnosis not present

## 2022-12-07 DIAGNOSIS — I5030 Unspecified diastolic (congestive) heart failure: Secondary | ICD-10-CM | POA: Diagnosis not present

## 2022-12-07 DIAGNOSIS — M545 Low back pain, unspecified: Secondary | ICD-10-CM | POA: Diagnosis not present

## 2022-12-07 DIAGNOSIS — Z7189 Other specified counseling: Secondary | ICD-10-CM | POA: Diagnosis not present

## 2022-12-07 DIAGNOSIS — E8770 Fluid overload, unspecified: Secondary | ICD-10-CM | POA: Diagnosis not present

## 2022-12-07 DIAGNOSIS — N390 Urinary tract infection, site not specified: Secondary | ICD-10-CM | POA: Diagnosis not present

## 2022-12-07 DIAGNOSIS — R531 Weakness: Secondary | ICD-10-CM | POA: Diagnosis not present

## 2022-12-07 DIAGNOSIS — D509 Iron deficiency anemia, unspecified: Secondary | ICD-10-CM | POA: Diagnosis not present

## 2022-12-07 DIAGNOSIS — M5442 Lumbago with sciatica, left side: Secondary | ICD-10-CM | POA: Diagnosis not present

## 2022-12-07 DIAGNOSIS — Z7401 Bed confinement status: Secondary | ICD-10-CM | POA: Diagnosis not present

## 2022-12-07 DIAGNOSIS — R399 Unspecified symptoms and signs involving the genitourinary system: Secondary | ICD-10-CM | POA: Diagnosis not present

## 2022-12-07 DIAGNOSIS — Z741 Need for assistance with personal care: Secondary | ICD-10-CM | POA: Diagnosis not present

## 2022-12-07 DIAGNOSIS — E119 Type 2 diabetes mellitus without complications: Secondary | ICD-10-CM | POA: Diagnosis not present

## 2022-12-07 DIAGNOSIS — R488 Other symbolic dysfunctions: Secondary | ICD-10-CM | POA: Diagnosis not present

## 2022-12-07 DIAGNOSIS — I509 Heart failure, unspecified: Secondary | ICD-10-CM | POA: Diagnosis not present

## 2022-12-07 DIAGNOSIS — S32010D Wedge compression fracture of first lumbar vertebra, subsequent encounter for fracture with routine healing: Secondary | ICD-10-CM | POA: Diagnosis not present

## 2022-12-07 DIAGNOSIS — J9610 Chronic respiratory failure, unspecified whether with hypoxia or hypercapnia: Secondary | ICD-10-CM | POA: Diagnosis not present

## 2022-12-07 DIAGNOSIS — I4891 Unspecified atrial fibrillation: Secondary | ICD-10-CM | POA: Diagnosis not present

## 2022-12-07 DIAGNOSIS — R609 Edema, unspecified: Secondary | ICD-10-CM | POA: Diagnosis not present

## 2022-12-07 DIAGNOSIS — M6281 Muscle weakness (generalized): Secondary | ICD-10-CM | POA: Diagnosis not present

## 2022-12-07 DIAGNOSIS — N183 Chronic kidney disease, stage 3 unspecified: Secondary | ICD-10-CM | POA: Diagnosis not present

## 2022-12-07 DIAGNOSIS — S32010G Wedge compression fracture of first lumbar vertebra, subsequent encounter for fracture with delayed healing: Secondary | ICD-10-CM | POA: Diagnosis not present

## 2022-12-07 DIAGNOSIS — G629 Polyneuropathy, unspecified: Secondary | ICD-10-CM | POA: Diagnosis not present

## 2022-12-07 LAB — BASIC METABOLIC PANEL
Anion gap: 11 (ref 5–15)
BUN: 32 mg/dL — ABNORMAL HIGH (ref 8–23)
CO2: 30 mmol/L (ref 22–32)
Calcium: 10.2 mg/dL (ref 8.9–10.3)
Chloride: 92 mmol/L — ABNORMAL LOW (ref 98–111)
Creatinine, Ser: 1.44 mg/dL — ABNORMAL HIGH (ref 0.44–1.00)
GFR, Estimated: 38 mL/min — ABNORMAL LOW (ref >60)
Glucose, Bld: 130 mg/dL — ABNORMAL HIGH (ref 70–99)
Potassium: 4.1 mmol/L (ref 3.5–5.1)
Sodium: 133 mmol/L — ABNORMAL LOW (ref 135–145)

## 2022-12-07 LAB — GLUCOSE, CAPILLARY
Glucose-Capillary: 150 mg/dL — ABNORMAL HIGH (ref 70–99)
Glucose-Capillary: 227 mg/dL — ABNORMAL HIGH (ref 70–99)

## 2022-12-07 MED ORDER — HYDROCODONE-ACETAMINOPHEN 10-325 MG PO TABS: 0.5000 | ORAL_TABLET | Freq: Four times a day (QID) | ORAL | 0 refills | Status: DC

## 2022-12-07 MED ORDER — LIDOCAINE 5 % EX PTCH: 1.0000 | MEDICATED_PATCH | CUTANEOUS | Status: DC

## 2022-12-07 MED ORDER — METHOCARBAMOL 500 MG PO TABS: 250.0000 mg | ORAL_TABLET | Freq: Three times a day (TID) | ORAL | 0 refills | Status: DC

## 2022-12-07 MED ORDER — FUROSEMIDE 40 MG PO TABS: 40.0000 mg | ORAL_TABLET | Freq: Two times a day (BID) | ORAL | Status: DC

## 2022-12-07 MED ORDER — GABAPENTIN 600 MG PO TABS: 600.0000 mg | ORAL_TABLET | Freq: Every day | ORAL | 0 refills | Status: DC

## 2022-12-07 NOTE — Discharge Summary (Addendum)
Physician Discharge Summary  [name]:[id number] DOB: 28-Feb-1946 DOA: 11/30/2022  PCP: Paulina Fusi, MD  Admit date: 11/30/2022 Discharge date: 12/07/2022  Time spent: 36 minutes  Recommendations for Outpatient Follow-up:  Limited prescription of Lasix given-need skilled nursing facility physician to check labs in about 2 to 3 days and titrate Lasix to either upward or downward based on volume status and shortness of breath Mandatory BiPAP at night Discontinue trimethoprim this hospitalization-follow-up with [facility] urology to discuss options about removal of Foley catheter which is a chronic 1 Recommend outpatient follow-up with pain physician for spinal cord stimulator etc. Limited prescription of opiates muscle relaxants etc. called and May require outpatient GI workup/single contrast barium swallow media to ensure that she is still able to swallow well as she is needed to dilatation in the past  Discharge Diagnoses:  MAIN problem for hospitalization   Acute hypoxic respiratory failure secondary to CHF Chronic low back pain with acute worsening secondary to L1 L3 neuropathy Chronic A-fib on Eliquis CHA2DS2-VASc >4 Prior saddle embolus LUTS with longstanding Foley catheter which was replaced 8/27 on suppressive therapy which was discontinued-outpatient follow-up required Prior dysphagia dilatations 06/2022 requires outpatient follow-up  Please see below for itemized issues addressed in HOpsital- refer to other progress notes for clarity if needed  Discharge Condition: Improved  Diet recommendation: Diabetic  Filed Weights   12/03/22 0552 12/04/22 0424 12/07/22 0500  Weight: 71.8 kg 69.4 kg 65.7 kg    History of present illness:  77Y female, BMI 34 moderate obesity A-fib/Eliquis CHADVASC >4,  Saddle embolus + shock + mechanical thrombectomy 11/2021 admission  Tertiary contractions with oropharyngeal dysphagia-EGD = dilatation Prior L1 fracture 06/2022 placed in  TLSO brace at that time on admission DM TY 2 Polypoid left nasal lesion follows with ENT Chronic low back pain prior cauda equina status post L2-L3 decompression Dr. Rolanda Jay 08/28/2021--Chronic low back pain + sciatica + chronic spinal cord stimulator   8/26 worsening acute/chronic LLE pain with L1-L3 neuropathy Neurosurgery evaluated and discussed with the ED and patient was started on eventual Medrol Dosepak for new T2 hyperintense left psoas signal + L1-L2 hyperintense signal 1.3X 1.2 cm that was felt to be noninfectious by Dr. Dutch Quint 8/30 for respiratory decompensation = CXR = no acute cardiopulmonary disease with chronic bronchial wall thickening--- barium swallow was ordered but cannot be completed on 8/30 so we are attempting to do this on 8/31 8/30 echocardiogram EF 55-60%  Hospital Course:  Acute hypoxic respiratory failure Secondary to volume overload and acute superimposed on chronic HFpEF as below Echo showed HFpEF Patient was diuresed with IV Lasix for several days and transition to Lasix 40 twice daily at discharge Volume status is -11 L weight drop from about 73 kg at highest to 65 at discharge Creatinine rose slightly and this will need to be monitored carefully on oral Lasix at discharge Several x-rays confirmed opacity on lungs but this was likely secondary to mainlyHFpEF as BNP was elevated and she never had a fever   Underlying prior dysphagia status post esophageal dilatation 06/2022 Vomited X2 yesterday chicken noodle soup-barium swallow pending Attempt diet today  barium swallow could never be done over the holiday weekend-this was discussed with hospital leadership  discussed with Dr. Barron Alvine on 8/30 t  given she is not coughing now however she does not require inpatient study and can likely discharge   Chronic A-fib CHADVASC >4/Eliquis Not on any rate control-monitor  echo done this admission as above  Prior saddle embolus 11/2021 + thrombectomy and  complicated hospitalization Continue Eliquis lifelong   DM TY 2 A1c around 6.1 CBG 130's Sugars were 150s to 200s but she was on steroids which were discontinued at discharge  Chronic low back pain Continue Oxy IR 10 every 4 as needed pain watch mentation continue Robaxin 250 3 times daily, gabapentin 600 at bedtime--- she will need de-escalation of these meds in the outpatient setting and likely will need to follow-up with her pain physician/back physician who has placed a spinal cord stimulator on her This was the main reason she came to the hospital and she will need close follow-up with her neurosurgeon Dr. Rolanda Jay who I will CC Discussed the case with Dr. Conchita Paris previously 8/29 and patient was started on Medrol Dosepak which we discontinued at discharge   Bilateral intertrigo Areas in the groin looked good Interdry to be placed at facility as per orders below pannus   LUTS-Foley placed 8/27 Has Foley catheter in place-attempt voiding trial-if no sensation to void will need outpatient reevaluation Stop trimethoprim at this time--- follows with [facility] urology needs to follow-up with her urologist there to discuss risk benefits alternatives to this But will discharge with Foley     Discharge Exam: Vitals:   12/07/22 0500 12/07/22 0844  BP: 103/70 109/70  Pulse: 80 88  Resp: 18 17  Temp: 97.9 F (36.6 C) (!) 97.5 F (36.4 C)  SpO2: (!) 85% 96%    Subj on day of d/c   EOMI NCAT well no distress eating and drinking  General Exam on discharge  EOMI NCAT no focal deficit No wheeze rales rhonchi JVD not elevated S1-S2 no murmur Abdomen soft no rebound Straight leg raise slightly limited No lower extremity edema  Discharge Instructions   Discharge Instructions     Diet - low sodium heart healthy   Complete by: As directed    Discharge wound care:   Complete by: As directed    Wound care  3 times daily      Comments: 1.        Clean bilateral groin and  underneath pannus with soap and water, dry thoroughly.  Apply Gerhardt's Butt Cream  to buttocks/groin and inner thighs 3 times a day and prn soiling.  Sprinkle over Gerhardt's with floor stock antifungal powder (Microguard-green label) with each application.    2.  Place Interdry AG underneath pannus:  Order [employer] [place] # [phone] Measure and cut length of InterDry to fit in skin folds that have skin breakdown Tuck InterDry fabric into skin folds in a single layer, allow for 2 inches of overhang from skin edges to allow for wicking to occur May remove to bathe; dry area thoroughly and then tuck into affected areas again Do not apply any creams or ointments when using InterDry DO NOT THROW AWAY FOR 5 DAYS unless soiled with stool DO NOT [facility] product, this will inactivate the silver in the material  New sheet of Interdry should be applied after 5 days of use if patient continues to have skin breakdown   Increase activity slowly   Complete by: As directed       Allergies as of 12/07/2022   No Known Allergies      Medication List     STOP taking these medications    diphenhydrAMINE 25 MG tablet Commonly known as: BENADRYL   promethazine 12.5 MG tablet Commonly known as: PHENERGAN   trimethoprim 100 MG tablet Commonly known as: TRIMPEX  TAKE these medications    albuterol 108 (90 Base) MCG/ACT inhaler Commonly known as: VENTOLIN HFA Inhale into the lungs every 6 (six) hours as needed for wheezing or shortness of breath.   apixaban 5 MG Tabs tablet Commonly known as: ELIQUIS Take 1 tablet (5 mg total) by mouth 2 (two) times daily.   bethanechol 50 MG tablet Commonly known as: URECHOLINE Take 50 mg by mouth 3 (three) times daily.   Centrum Silver 50+Women Tabs Take 1 tablet by mouth daily with breakfast.   Co Q-10 200 MG Caps Take 200 mg by mouth in the morning.   docusate sodium 100 MG capsule Commonly known as: COLACE Take 100 mg by mouth 2 (two) times daily.    fluticasone furoate-vilanterol 100-25 MCG/ACT Aepb Commonly known as: BREO ELLIPTA Inhale 1-2 puffs into the lungs daily.   furosemide 40 MG tablet Commonly known as: LASIX Take 1 tablet (40 mg total) by mouth 2 (two) times daily.   gabapentin 600 MG tablet Commonly known as: NEURONTIN Take 1 tablet (600 mg total) by mouth at bedtime.   HYDROcodone-acetaminophen 10-325 MG tablet Commonly known as: NORCO Take 0.5 tablets by mouth 4 (four) times daily.   Iron (Ferrous Sulfate) 325 (65 Fe) MG Tabs Take 65 mg of iron by mouth daily with breakfast.   latanoprost 0.005 % ophthalmic solution Commonly known as: XALATAN Place 1 drop into both eyes at bedtime.   lidocaine 5 % Commonly known as: LIDODERM Place 1 patch onto the skin daily. Remove & Discard patch within 12 hours or as directed by MD   lovastatin 40 MG tablet Commonly known as: MEVACOR Take 40 mg by mouth at bedtime.   methocarbamol 500 MG tablet Commonly known as: ROBAXIN Take 0.5 tablets (250 mg total) by mouth 3 (three) times daily.   naloxone 4 MG/0.1ML Liqd nasal spray kit Commonly known as: NARCAN Place 1 spray into the nose daily as needed (accidental overdose).   nitroGLYCERIN 0.4 MG SL tablet Commonly known as: NITROSTAT Place 0.4 mg under the tongue every 5 (five) minutes as needed for chest pain.   nystatin cream Commonly known as: MYCOSTATIN Apply 1 Application topically 2 (two) times daily.   pantoprazole 40 MG tablet Commonly known as: PROTONIX Take 1 tablet (40 mg total) by mouth daily.   Rybelsus 3 MG Tabs Generic drug: Semaglutide Take 3 mg by mouth daily before breakfast.   tamsulosin 0.4 MG Caps capsule Commonly known as: FLOMAX Take 0.4 mg by mouth 2 (two) times daily.   triamcinolone ointment 0.5 % Commonly known as: KENALOG Apply 1 Application topically See admin instructions. Apply to affected areas of both feet two to three times a week   Vitamin B-12 500 MCG Subl Place 500  mcg under the tongue in the morning.               Discharge Care Instructions  (From admission, onward)           Start     Ordered   12/07/22 0000  Discharge wound care:       Comments: Wound care  3 times daily      Comments: 1.        Clean bilateral groin and underneath pannus with soap and water, dry thoroughly.  Apply Gerhardt's Butt Cream  to buttocks/groin and inner thighs 3 times a day and prn soiling.  Sprinkle over Gerhardt's with floor stock antifungal powder (Microguard-green label) with each application.    2.  Place Interdry AG underneath pannus:  Order [employer] [place] # [id number] Measure and cut length of InterDry to fit in skin folds that have skin breakdown Tuck InterDry fabric into skin folds in a single layer, allow for 2 inches of overhang from skin edges to allow for wicking to occur May remove to bathe; dry area thoroughly and then tuck into affected areas again Do not apply any creams or ointments when using InterDry DO NOT THROW AWAY FOR 5 DAYS unless soiled with stool DO NOT [facility] product, this will inactivate the silver in the material  New sheet of Interdry should be applied after 5 days of use if patient continues to have skin breakdown   12/07/22 0931           No Known Allergies    The results of significant diagnostics from this hospitalization (including imaging, microbiology, ancillary and laboratory) are listed below for reference.    Significant Diagnostic Studies: DG Chest 2 View  Result Date: 12/05/2022 CLINICAL DATA:  Cough EXAM: CHEST - 2 VIEW COMPARISON:  12/04/2022 FINDINGS: Frontal and lateral views of the chest demonstrate stable enlargement the cardiac silhouette. Stable ectasia of the thoracic aorta. Since the prior exam, there is developing consolidation and small effusions within the posterior costophrenic angles. No pneumothorax. No acute bony abnormalities. IMPRESSION: 1. New bilateral lower lobe consolidation and small effusions at  the posterior costophrenic angles, which could reflect developing edema or infection. Electronically Signed   By: Sharlet Salina M.D.   On: 12/05/2022 16:10   ECHOCARDIOGRAM COMPLETE  Result Date: 12/04/2022    ECHOCARDIOGRAM REPORT   Patient Name:   Lisa Maxwell [facility] Date of Exam: 12/04/2022 Medical Rec #:  [id number]      Height:       56.0 in Accession #:    [id number]     Weight:       153.0 lb Date of Birth:  05/26/1945      BSA:          1.584 m Patient Age:    5 years       BP:           115/70 mmHg Patient Gender: F              HR:           78 bpm. Exam Location:  Inpatient Procedure: 2D Echo, Cardiac Doppler and Color Doppler Indications:    CHF Acute Diastolic I50.31  History:        Patient has prior history of Echocardiogram examinations, most                 recent 02/09/2022. CAD, Arrythmias:Atrial Fibrillation; Risk                 Factors:Diabetes and Hypertension.  Sonographer:    Harriette Bouillon RDCS Referring Phys: [phone] JARED M GARDNER  Sonographer Comments: Technically difficult study due to poor echo windows. IMPRESSIONS  1. Left ventricular ejection fraction, by estimation, is 55 to 60%. Left ventricular ejection fraction by PLAX is 58 %. The left ventricle has normal function. The left ventricle has no regional wall motion abnormalities. Left ventricular diastolic function could not be evaluated.  2. Right ventricular systolic function was not well visualized. The right ventricular size is not well visualized. Tricuspid regurgitation signal is inadequate for assessing PA pressure.  3. The mitral valve is abnormal. Trivial mitral valve regurgitation.  4. The aortic valve is tricuspid. Aortic valve regurgitation is not  visualized. Aortic valve sclerosis/calcification is present, without any evidence of aortic stenosis.  5. The inferior vena cava is dilated in size with >50% respiratory variability, suggesting right atrial pressure of 8 mmHg. Comparison(s): Changes from prior study are noted.  02/09/2022: LVEF 60-65%. FINDINGS  Left Ventricle: Left ventricular ejection fraction, by estimation, is 55 to 60%. Left ventricular ejection fraction by PLAX is 58 %. The left ventricle has normal function. The left ventricle has no regional wall motion abnormalities. The left ventricular internal cavity size was normal in size. There is no left ventricular hypertrophy. Left ventricular diastolic function could not be evaluated due to atrial fibrillation. Left ventricular diastolic function could not be evaluated. Right Ventricle: The right ventricular size is not well visualized. Right vetricular wall thickness was not well visualized. Right ventricular systolic function was not well visualized. Tricuspid regurgitation signal is inadequate for assessing PA pressure. Left Atrium: Left atrial size was normal in size. Right Atrium: Right atrial size was normal in size. Pericardium: There is no evidence of pericardial effusion. Mitral Valve: The mitral valve is abnormal. There is mild calcification of the mitral valve leaflet(s). Trivial mitral valve regurgitation. Tricuspid Valve: The tricuspid valve is not well visualized. Tricuspid valve regurgitation is not demonstrated. Aortic Valve: The aortic valve is tricuspid. Aortic valve regurgitation is not visualized. Aortic valve sclerosis/calcification is present, without any evidence of aortic stenosis. Pulmonic Valve: The pulmonic valve was grossly normal. Pulmonic valve regurgitation is trivial. Aorta: The aortic root and ascending aorta are structurally normal, with no evidence of dilitation. Venous: The inferior vena cava is dilated in size with greater than 50% respiratory variability, suggesting right atrial pressure of 8 mmHg. IAS/Shunts: The interatrial septum was not well visualized.  LEFT VENTRICLE PLAX 2D LV EF:         Left ventricular ejection fraction by PLAX is 58 %. LVIDd:         4.60 cm LVIDs:         3.20 cm LV PW:         0.80 cm LV IVS:        0.80  cm LVOT diam:     2.10 cm LV SV:         35 LV SV Index:   22 LVOT Area:     3.46 cm  RIGHT VENTRICLE         IVC TAPSE (M-mode): 1.7 cm  IVC diam: 2.20 cm LEFT ATRIUM           Index LA diam:      3.80 cm 2.40 cm/m LA Vol (A4C): 45.5 ml 28.72 ml/m  AORTIC VALVE LVOT Vmax:   47.60 cm/s LVOT Vmean:  36.900 cm/s LVOT VTI:    0.102 m  AORTA Ao Root diam: 2.70 cm Ao Asc diam:  3.10 cm MITRAL VALVE MV Area (PHT): 3.93 cm     SHUNTS MV Decel Time: 193 msec     Systemic VTI:  0.10 m MV E velocity: 114.00 cm/s  Systemic Diam: 2.10 cm Zoila Shutter MD Electronically signed by Zoila Shutter MD Signature Date/Time: 12/04/2022/3:44:45 PM    Final    DG CHEST PORT 1 VIEW  Result Date: 12/04/2022 CLINICAL DATA:  Hypoxia. EXAM: PORTABLE CHEST 1 VIEW COMPARISON:  06/09/2022 FINDINGS: Dorsal column stimulator is unchanged in position from previous exam. Spinal fusion hardware noted within the lumbar spine. Stable cardiomediastinal contours. No signs of pleural effusion, interstitial edema or airspace disease. Chronic bronchial wall thickening is again  noted. IMPRESSION: 1. No acute cardiopulmonary disease. 2. Chronic bronchial wall thickening. Electronically Signed   By: Signa Kell M.D.   On: 12/04/2022 06:30   MR LUMBAR SPINE W CONTRAST  Result Date: 12/01/2022 CLINICAL DATA:  Back pain. Prior surgery. New symptoms on the left. Weakness and pain. Noncontrast study showed an abnormal psoas collection at L1-2 and abnormal epidural material. EXAM: MRI LUMBAR SPINE WITH CONTRAST TECHNIQUE: Multiplanar and multiecho pulse sequences of the lumbar spine were obtained with intravenous contrast. CONTRAST:  6mL GADAVIST GADOBUTROL 1 MMOL/ML IV SOLN COMPARISON:  None Available. FINDINGS: Postcontrast imaging is extremely limited because of signal disruption from the fusion hardware at L2-3 and patient motion. The findings on these additional exams do not definitely lead [place] to establish the diagnosis of spinal infection. I see  that the patient does not have an elevated white count. Additionally, without fever, the findings could simply relate to the recent fusion and the inferior endplate fracture at L1. IMPRESSION: Extremely limited postcontrast imaging because of patient motion and signal disruption from the fusion hardware at L2-3. The findings on these additional exams do not definitely lead [place] to establish the diagnosis of spinal infection. I see that the patient does not have an elevated white count. Additionally, without fever, the findings could simply relate to the recent fusion and the inferior endplate fracture at L1. Electronically Signed   By: Paulina Fusi M.D.   On: 12/01/2022 18:22   MR LUMBAR SPINE WO CONTRAST  Result Date: 11/30/2022 CLINICAL DATA:  Low back pain, prior surgery, new symptoms left L1-L3 radiculopathy, weakness and pain with left hip flexion EXAM: MRI LUMBAR SPINE WITHOUT CONTRAST TECHNIQUE: Multiplanar, multisequence MR imaging of the lumbar spine was performed. No intravenous contrast was administered. COMPARISON:  Lumbar spine CT dated 06/09/2022, lumbar spine MRI dated 06/09/2022 FINDINGS: Limitations: Assessment of the surgical levels is limited due to susceptibility artifact from metallic fusion hardware. Segmentation:  Standard. Alignment: Grade 1 anterolisthesis of L4 on L5. Trace retrolisthesis of L1 on L2. Vertebrae: Redemonstrated postsurgical changes from L2-L3 spinal fusion. Compared to prior exam Conus medullaris and cauda equina: Conus extends to the T12-L1 disc space level. Conus and cauda equina appear normal. Paraspinal and other soft tissues: Compared to prior exam there is new T2 hyperintense signal in the left psoas muscle the L1-L2 level. (Series 6, image 13) measuring 1.3 x 1 2 cm. There may be a small locule of air in this region. Disc levels: Compared to prior exam there is likely new nonspecific fluid in the ventral epidural space at the L1 and L2 levels (series 4, image 10).  There is severe spinal canal stenosis L1-L2, L3-L4 levels. Moderate spinal canal stenosis at L4-L5. There is severe left neural foraminal narrowing at L4-L5, unchanged from prior exam. There are severe multilevel facet degenerative changes, most notably at L4-L5 and L5-S1. IMPRESSION: 1. Compared to prior exam, there is new T2 hyperintense signal in the left psoas muscle at the L1-L2 level measuring 1.3 x 1.2 cm. There may be a small locule of air in this region. Findings are concerning for an abscess. Recommend further evaluation with contrast enhanced lumbar spine MRI. 2. Compared to prior exam, there is likely new nonspecific fluid in the ventral epidural space at the L1 and L2 levels. Recommend attention on contrast-enhanced MRI of the lumbar spine to exclude the possibility for ventral epidural abscess. 3. Redemonstrated postsurgical changes from L2-L3 spinal fusion. 4. Severe spinal canal stenosis at L1-L2 and L3-L4  levels. 5. Severe left neural foraminal narrowing at L4-L5. Electronically Signed   By: Lorenza Cambridge M.D.   On: 11/30/2022 20:48   DG Hip Unilat W or Wo Pelvis 2-3 Views Left  Result Date: 11/30/2022 CLINICAL DATA:  Hip pain.  Back pain. EXAM: DG HIP (WITH OR WITHOUT PELVIS) 2-3V LEFT COMPARISON:  None Available. FINDINGS: Pelvis is intact with normal and symmetric sacroiliac joints. No acute fracture or dislocation. No aggressive osseous lesion. Visualized sacral arcuate lines are unremarkable. Unremarkable symphysis pubis. There are mild degenerative changes of bilateral hip joints without significant joint space narrowing. Osteophytosis of the superior acetabulum. No radiopaque foreign bodies. Neurostimulator device battery pack is seen overlying the right iliac wing. IMPRESSION: 1. Mild bilateral hip osteoarthritis. Electronically Signed   By: Jules Schick M.D.   On: 11/30/2022 16:12   DG Lumbar Spine Complete  Result Date: 11/30/2022 CLINICAL DATA:  Back and hip pain. EXAM: LUMBAR  SPINE - COMPLETE 4+ VIEW COMPARISON:  Lumbar spine 09/09/2022 and MRI 06/09/2022 FINDINGS: Scoliosis in the lumbar spine with the apex towards the right at L1-L2. Posterior lumbar interbody fusion at L2-L3 with bilateral pedicle screws, rods and an interbody device. Surgical hardware is similar to the previous examination. Deformity along the anterior inferior endplate pf L1 appears unchanged. Stable grade 1 anterolisthesis of L4 on L5. Extensive facet arthropathy in the lower lumbar spine. Patient has a spinal stimulator device in the thoracic spine. IMPRESSION: 1. No acute abnormality in the lumbar spine. 2. Stable postsurgical changes at L2-L3. 3. Chronic scoliosis and degenerative changes in the lumbar spine. Electronically Signed   By: Richarda Overlie M.D.   On: 11/30/2022 16:07    Microbiology: Recent Results (from the past 240 hour(s))  SARS Coronavirus 2 by RT PCR (hospital order, performed in [facility] hospital lab) *cepheid single result test* Anterior Nasal Swab     Status: None   Collection Time: 12/04/22  6:26 AM   Specimen: Anterior Nasal Swab  Result Value Ref Range Status   SARS Coronavirus 2 by RT PCR NEGATIVE NEGATIVE Final    Comment: Performed at [facility], [address], [place], [place] [address]     Labs: Basic Metabolic Panel: Recent Labs  Lab 11/30/22 1507 12/01/22 0865 12/03/22 0254 12/04/22 0543 12/04/22 2100 12/05/22 0537 12/06/22 0433 12/07/22 0415  NA 137 136 132* 131* 134* 134* 134* 133*  K 4.3 4.2 4.2 5.9* 4.2 4.6 4.1 4.1  CL 104 105 103 99 97* 99 96* 92*  CO2 24 24 24 22 26 29 29 30   GLUCOSE 130* 119* 134* 188* 90 141* 146* 130*  BUN 20 15 14 19  24* 26* 31* 32*  CREATININE 1.33* 1.15* 1.24* 1.26* 1.39* 1.42* 1.43* 1.44*  CALCIUM 10.1 9.5 9.3 9.9 10.2 10.0 9.9 10.2  MG 1.9 1.6* 1.8  --   --   --   --   --   PHOS  --  3.0  --   --   --   --   --   --    Liver Function Tests: Recent Labs  Lab 11/30/22 1507 12/01/22 0339 12/03/22 0254  12/04/22 0543  AST 23 22 27  41  ALT 18 15 16 20   ALKPHOS 68 51 55 66  BILITOT 0.5 0.5 0.5 1.0  PROT 6.2* 4.9* 5.2* 5.8*  ALBUMIN 3.3* 2.6* 2.5* 2.8*   No results for input(s): "LIPASE", "AMYLASE" in the last 168 hours. No results for input(s): "AMMONIA" in the  last 168 hours. CBC: Recent Labs  Lab 11/30/22 1507 12/01/22 0339 12/04/22 0543  WBC 7.6 6.1 5.6  NEUTROABS 5.6 4.1  --   HGB 13.1 11.1* 12.7  HCT 42.5 35.3* 39.8  MCV 92.8 91.0 89.2  PLT 224 182 187   Cardiac Enzymes: No results for input(s): "CKTOTAL", "CKMB", "CKMBINDEX", "TROPONINI" in the last 168 hours. BNP: BNP (last 3 results) Recent Labs    06/09/22 1108 12/04/22 0543 12/06/22 0433  BNP 93.3 271.1* 222.9*    ProBNP (last 3 results) No results for input(s): "PROBNP" in the last 8760 hours.  CBG: Recent Labs  Lab 12/06/22 0801 12/06/22 1306 12/06/22 1700 12/06/22 2106 12/07/22 0633  GLUCAP 117* 211* 127* 239* 150*       Signed:  Rhetta Mura MD   [facility] 12/07/2022, 9:31 AM

## 2022-12-07 NOTE — TOC Progression Note (Addendum)
Transition of Care [facility]) - Progression Note    Patient Details  Name: Lisa Maxwell MRN: [id number] Date of Birth: 1945-06-27  Transition of Care [facility]) CM/SW Contact  Leander Rams, LCSW Phone Number: 12/07/2022, 10:59 AM  Clinical Narrative:    Pt is medically stable for dc to SNF. CSW called [facility] to confirm pt can admit to facility and to retrieve room number and number to call report to. There was no answer, CSW did leave a VM.   11:40 CSW received call back from Lauren who confirmed that can admit pt today to room 207.   Expected Discharge Plan: Skilled Nursing Facility Barriers to Discharge: Continued Medical Work up, English as a second language teacher  Expected Discharge Plan and Services     Post Acute Care Choice: Skilled Nursing Facility Living arrangements for the past 2 months: Single Family Home Expected Discharge Date: 12/07/22                                     Social Determinants of Health (SDOH) Interventions SDOH Screenings   Food Insecurity: No Food Insecurity (12/01/2022)  Housing: Low Risk  (12/01/2022)  Transportation Needs: No Transportation Needs (12/01/2022)  Utilities: Not At Risk (12/01/2022)  Alcohol Screen: Low Risk  (07/31/2022)  Depression (PHQ2-9): Low Risk  (07/31/2022)  Financial Resource Strain: Low Risk  (07/31/2022)  Physical Activity: Unknown (07/31/2022)  Tobacco Use: High Risk (12/01/2022)    Readmission Risk Interventions     No data to display         Oletta Lamas, MSW, LCSWA, LCASA Transitions of Care  Clinical Social Worker I

## 2022-12-07 NOTE — Progress Notes (Signed)
Patient is ready for discharge to [facility].  Patient is alert and oriented X4 , Viral signs stable ,  denies any pain or discomfort, husband is at bedside.  Report called to Alina,RN at [facility].  Awating PYTAR for transfer to the facility.

## 2022-12-07 NOTE — Progress Notes (Signed)
Physical Therapy Treatment Patient Details Name: Lisa Maxwell MRN: [id number] DOB: Sep 08, 1945 Today's Date: 12/07/2022   History of Present Illness 77 yo female admitted with L side lower back pain 8/26. MRI (+) edema related to severity of DDD followed by [facility] outpatient PMH AD, cerebrovascular disease, a-fib, COPD, chronic pain syndrome, renal failure, DDD, DM, HTN, bil TKA, PVD, spinal stenosis lumbar spine with neurogenic claudication, cauda equina compression, CAD, CKD.    PT Comments  Pt showing some modest progress towards functional goals. Mod assist with bed mobility and transfer with RW. Tolerated pre-gait training however limited by LLE weakness, inability to clear Lt foot for adequate step. Tolerated LE exercises well. OOB in chair, encouraged routine HEP compliance. Patient will continue to benefit from skilled physical therapy services to further improve independence with functional mobility.     If plan is discharge home, recommend the following: A lot of help with walking and/or transfers;A lot of help with bathing/dressing/bathroom;Assistance with cooking/housework;Direct supervision/assist for medications management;Direct supervision/assist for financial management;Assist for transportation;Help with stairs or ramp for entrance   Can travel by private vehicle     Yes  Equipment Recommendations  None recommended by PT    Recommendations for Other Services       Precautions / Restrictions Precautions Precautions: Back Precaution Comments: States surgeon instructed her to wear TLSO when OOB. Restrictions Weight Bearing Restrictions: No     Mobility  Bed Mobility Overal bed mobility: Needs Assistance Bed Mobility: Rolling, Sidelying to Sit Rolling: Mod assist, Used rails Sidelying to sit: Mod assist, HOB elevated, +2 for safety/equipment       General bed mobility comments: Mod assist to roll and bring LEs off EOB. mod A for trunk support to rise. Pt  showing improved strength and effort to push herself up. Cues and reviewed log roll prior.    Transfers Overall transfer level: Needs assistance Equipment used: Rolling walker (2 wheels) Transfers: Sit to/from Stand, Bed to chair/wheelchair/BSC Sit to Stand: Mod assist   Step pivot transfers: Mod assist       General transfer comment: Mod assist for boost to stand from bed, min assist from recliner x2. VC for hand placement. Mod assist for balance to step pivot to chair. Drags LLE, unable to flex hip, knee, and ankle adequately for clearance. Cues for sequencing. Assisted with RW.    Ambulation/Gait             Pre-gait activities: Pre-gait with weight shift and march, attempted forward steps but unable to clear LLE adequately. Able to lock Lt knee for good Rt foot clearance. BIL UE support on RW, mod assist for balance.     Stairs             Wheelchair Mobility     Tilt Bed    Modified Rankin (Stroke Patients Only)       Balance Overall balance assessment: Needs assistance Sitting-balance support: Feet supported, No upper extremity supported Sitting balance-Leahy Scale: Fair Sitting balance - Comments: Sits edge of seat without UE support. Leans Rt due to Lt hip pain. Postural control: Right lateral lean Standing balance support: Bilateral upper extremity supported, During functional activity, Reliant on assistive device for balance Standing balance-Leahy Scale: Poor                              Cognition Arousal: Alert Behavior During Therapy: WFL for tasks assessed/performed Overall Cognitive Status: History of cognitive  impairments - at baseline                                 General Comments: hx of memory deficits, pleasant and follow commands consistently        Exercises General Exercises - Lower Extremity Ankle Circles/Pumps: AROM, Both, 10 reps, Supine Quad Sets: Strengthening, Both, 10 reps, Seated Gluteal  Sets: Strengthening, Both, 10 reps, Seated Long Arc Quad: Strengthening, Both, 10 reps, Seated Hip ABduction/ADduction: Strengthening, Both, 10 reps, Seated Heel Raises: Strengthening, Both, 10 reps, Standing Mini-Sqauts: Strengthening, Both, 10 reps, Standing    General Comments General comments (skin integrity, edema, etc.): Brace adjusted for comfort and adequate support.      Pertinent Vitals/Pain Pain Assessment Pain Assessment: Faces Faces Pain Scale: Hurts little more Pain Location: low back, L hip Pain Descriptors / Indicators: Guarding, Sore, Radiating Pain Intervention(s): Monitored during session, Repositioned    Home Living                          Prior Function            PT Goals (current goals can now be found in the care plan section) Acute Rehab PT Goals Patient Stated Goal: Get well PT Goal Formulation: With patient/family Time For Goal Achievement: 12/16/22 Potential to Achieve Goals: Good Progress towards PT goals: Progressing toward goals    Frequency    Min 1X/week      PT Plan      Co-evaluation              AM-PAC PT "6 Clicks" Mobility   Outcome Measure  Help needed turning from your back to your side while in a flat bed without using bedrails?: A Lot Help needed moving from lying on your back to sitting on the side of a flat bed without using bedrails?: A Lot Help needed moving to and from a bed to a chair (including a wheelchair)?: A Lot Help needed standing up from a chair using your arms (e.g., wheelchair or bedside chair)?: A Lot Help needed to walk in hospital room?: Total Help needed climbing 3-5 steps with a railing? : Total 6 Click Score: 10    End of Session Equipment Utilized During Treatment: Gait belt;Back brace Activity Tolerance: Patient tolerated treatment well Patient left: in chair;with call bell/phone within reach;with chair alarm set Nurse Communication: Mobility status PT Visit Diagnosis:  Unsteadiness on feet (R26.81);Muscle weakness (generalized) (M62.81);Difficulty in walking, not elsewhere classified (R26.2);Other symptoms and signs involving the nervous system (R29.898);Pain Pain - Right/Left: Left Pain - part of body: Leg     Time: 0929-0959 PT Time Calculation (min) (ACUTE ONLY): 30 min  Charges:    $Therapeutic Exercise: 8-22 mins $Therapeutic Activity: 8-22 mins PT General Charges $$ ACUTE PT VISIT: 1 Visit                     Kathlyn [place], PT, DPT [facility]  Rehabilitation Services Physical Therapist Office: [phone] Website: [email or web address]    Berton Mount 12/07/2022, 11:22 AM

## 2022-12-07 NOTE — TOC Transition Note (Signed)
Transition of Care [facility]) - CM/SW Discharge Note   Patient Details  Name: Lisa Maxwell MRN: [id number] Date of Birth: December 08, 1945  Transition of Care [facility]) CM/SW Contact:  Leander Rams, LCSW Phone Number: 12/07/2022, 12:31 PM   Clinical Narrative:    Patient will DC to: [facility] Anticipated DC date: 12/07/2022 Family notified: Kendell Bane  Transport by: Sharin Mons   Per MD patient ready for DC to [facility]. RN, patient, patient's family, and facility notified of DC. Discharge Summary and FL2 sent to facility. RN to call report prior to discharge [phone]. DC packet on chart. Ambulance transport requested for patient.   CSW will sign off for now as social work intervention is no longer needed. Please consult [place] again if new needs arise.    Final next level of care: Skilled Nursing Facility Barriers to Discharge: No Barriers Identified   Patient Goals and CMS Choice CMS Medicare.gov Compare Post Acute Care list provided to:: Patient Choice offered to / list presented to : Patient  Discharge Placement                Patient chooses bed at:  [facility]) Patient to be transferred to facility by: [facility] Name of family member notified: Kendell Bane Patient and family notified of of transfer: 12/07/22  Discharge Plan and Services Additional resources added to the After Visit Summary for       Post Acute Care Choice: Skilled Nursing Facility                               Social Determinants of Health (SDOH) Interventions SDOH Screenings   Food Insecurity: No Food Insecurity (12/01/2022)  Housing: Low Risk  (12/01/2022)  Transportation Needs: No Transportation Needs (12/01/2022)  Utilities: Not At Risk (12/01/2022)  Alcohol Screen: Low Risk  (07/31/2022)  Depression (PHQ2-9): Low Risk  (07/31/2022)  Financial Resource Strain: Low Risk  (07/31/2022)  Physical Activity: Unknown (07/31/2022)  Tobacco Use: High Risk (12/01/2022)      Readmission Risk Interventions     No data to display           Oletta Lamas, MSW, LCSWA, LCASA Transitions of Care  Clinical Social Worker I

## 2022-12-09 DIAGNOSIS — I4891 Unspecified atrial fibrillation: Secondary | ICD-10-CM | POA: Diagnosis not present

## 2022-12-09 DIAGNOSIS — J9610 Chronic respiratory failure, unspecified whether with hypoxia or hypercapnia: Secondary | ICD-10-CM | POA: Diagnosis not present

## 2022-12-09 DIAGNOSIS — M545 Low back pain, unspecified: Secondary | ICD-10-CM | POA: Diagnosis not present

## 2022-12-09 DIAGNOSIS — Z7901 Long term (current) use of anticoagulants: Secondary | ICD-10-CM | POA: Diagnosis not present

## 2022-12-14 DIAGNOSIS — I509 Heart failure, unspecified: Secondary | ICD-10-CM | POA: Diagnosis not present

## 2022-12-14 DIAGNOSIS — I4891 Unspecified atrial fibrillation: Secondary | ICD-10-CM | POA: Diagnosis not present

## 2022-12-14 DIAGNOSIS — Z7901 Long term (current) use of anticoagulants: Secondary | ICD-10-CM | POA: Diagnosis not present

## 2022-12-14 DIAGNOSIS — J9610 Chronic respiratory failure, unspecified whether with hypoxia or hypercapnia: Secondary | ICD-10-CM | POA: Diagnosis not present

## 2022-12-15 DIAGNOSIS — E8770 Fluid overload, unspecified: Secondary | ICD-10-CM | POA: Diagnosis not present

## 2022-12-15 DIAGNOSIS — N183 Chronic kidney disease, stage 3 unspecified: Secondary | ICD-10-CM | POA: Diagnosis not present

## 2022-12-15 DIAGNOSIS — E119 Type 2 diabetes mellitus without complications: Secondary | ICD-10-CM | POA: Diagnosis not present

## 2022-12-21 DIAGNOSIS — N390 Urinary tract infection, site not specified: Secondary | ICD-10-CM | POA: Diagnosis not present

## 2022-12-21 DIAGNOSIS — R339 Retention of urine, unspecified: Secondary | ICD-10-CM | POA: Diagnosis not present

## 2022-12-22 DIAGNOSIS — N183 Chronic kidney disease, stage 3 unspecified: Secondary | ICD-10-CM | POA: Diagnosis not present

## 2022-12-22 DIAGNOSIS — E8770 Fluid overload, unspecified: Secondary | ICD-10-CM | POA: Diagnosis not present

## 2022-12-22 DIAGNOSIS — G629 Polyneuropathy, unspecified: Secondary | ICD-10-CM | POA: Diagnosis not present

## 2022-12-22 DIAGNOSIS — R609 Edema, unspecified: Secondary | ICD-10-CM | POA: Diagnosis not present

## 2022-12-23 DIAGNOSIS — S32010G Wedge compression fracture of first lumbar vertebra, subsequent encounter for fracture with delayed healing: Secondary | ICD-10-CM | POA: Diagnosis not present

## 2022-12-28 DIAGNOSIS — E1122 Type 2 diabetes mellitus with diabetic chronic kidney disease: Secondary | ICD-10-CM | POA: Diagnosis not present

## 2022-12-28 DIAGNOSIS — I480 Paroxysmal atrial fibrillation: Secondary | ICD-10-CM | POA: Diagnosis not present

## 2022-12-28 DIAGNOSIS — E1151 Type 2 diabetes mellitus with diabetic peripheral angiopathy without gangrene: Secondary | ICD-10-CM | POA: Diagnosis not present

## 2022-12-28 DIAGNOSIS — J449 Chronic obstructive pulmonary disease, unspecified: Secondary | ICD-10-CM | POA: Diagnosis not present

## 2022-12-28 DIAGNOSIS — I130 Hypertensive heart and chronic kidney disease with heart failure and stage 1 through stage 4 chronic kidney disease, or unspecified chronic kidney disease: Secondary | ICD-10-CM | POA: Diagnosis not present

## 2022-12-28 DIAGNOSIS — J9601 Acute respiratory failure with hypoxia: Secondary | ICD-10-CM | POA: Diagnosis not present

## 2022-12-28 DIAGNOSIS — I5032 Chronic diastolic (congestive) heart failure: Secondary | ICD-10-CM | POA: Diagnosis not present

## 2022-12-28 DIAGNOSIS — N1832 Chronic kidney disease, stage 3b: Secondary | ICD-10-CM | POA: Diagnosis not present

## 2022-12-29 DIAGNOSIS — I5033 Acute on chronic diastolic (congestive) heart failure: Secondary | ICD-10-CM | POA: Diagnosis not present

## 2022-12-29 DIAGNOSIS — G8929 Other chronic pain: Secondary | ICD-10-CM | POA: Diagnosis not present

## 2022-12-29 DIAGNOSIS — I482 Chronic atrial fibrillation, unspecified: Secondary | ICD-10-CM | POA: Diagnosis not present

## 2022-12-29 DIAGNOSIS — J9601 Acute respiratory failure with hypoxia: Secondary | ICD-10-CM | POA: Diagnosis not present

## 2022-12-29 DIAGNOSIS — R1310 Dysphagia, unspecified: Secondary | ICD-10-CM | POA: Diagnosis not present

## 2022-12-29 DIAGNOSIS — R399 Unspecified symptoms and signs involving the genitourinary system: Secondary | ICD-10-CM | POA: Diagnosis not present

## 2022-12-30 ENCOUNTER — Other Ambulatory Visit: Payer: Self-pay | Admitting: Cardiology

## 2022-12-30 DIAGNOSIS — J449 Chronic obstructive pulmonary disease, unspecified: Secondary | ICD-10-CM | POA: Diagnosis not present

## 2022-12-30 DIAGNOSIS — Z7901 Long term (current) use of anticoagulants: Secondary | ICD-10-CM | POA: Diagnosis not present

## 2022-12-30 DIAGNOSIS — I700 Atherosclerosis of aorta: Secondary | ICD-10-CM | POA: Diagnosis not present

## 2022-12-30 DIAGNOSIS — I4891 Unspecified atrial fibrillation: Secondary | ICD-10-CM | POA: Diagnosis not present

## 2022-12-30 DIAGNOSIS — R002 Palpitations: Secondary | ICD-10-CM | POA: Diagnosis not present

## 2022-12-30 DIAGNOSIS — I480 Paroxysmal atrial fibrillation: Secondary | ICD-10-CM

## 2022-12-30 DIAGNOSIS — Z978 Presence of other specified devices: Secondary | ICD-10-CM | POA: Diagnosis not present

## 2022-12-30 DIAGNOSIS — I4510 Unspecified right bundle-branch block: Secondary | ICD-10-CM | POA: Diagnosis not present

## 2022-12-30 DIAGNOSIS — Z79899 Other long term (current) drug therapy: Secondary | ICD-10-CM | POA: Diagnosis not present

## 2022-12-30 DIAGNOSIS — I10 Essential (primary) hypertension: Secondary | ICD-10-CM | POA: Diagnosis not present

## 2022-12-30 NOTE — Telephone Encounter (Signed)
Prescription refill request for Eliquis received. Indication: Afib  Last office visit:06/03/22 (Revankar)  Scr: 1.44 (12/07/22)  Age: 77 Weight: 65.7kg  Appropriate dose. Refill sent.

## 2022-12-31 DIAGNOSIS — I130 Hypertensive heart and chronic kidney disease with heart failure and stage 1 through stage 4 chronic kidney disease, or unspecified chronic kidney disease: Secondary | ICD-10-CM | POA: Diagnosis not present

## 2022-12-31 DIAGNOSIS — N1832 Chronic kidney disease, stage 3b: Secondary | ICD-10-CM | POA: Diagnosis not present

## 2022-12-31 DIAGNOSIS — J9601 Acute respiratory failure with hypoxia: Secondary | ICD-10-CM | POA: Diagnosis not present

## 2022-12-31 DIAGNOSIS — J449 Chronic obstructive pulmonary disease, unspecified: Secondary | ICD-10-CM | POA: Diagnosis not present

## 2022-12-31 DIAGNOSIS — I480 Paroxysmal atrial fibrillation: Secondary | ICD-10-CM | POA: Diagnosis not present

## 2022-12-31 DIAGNOSIS — E1151 Type 2 diabetes mellitus with diabetic peripheral angiopathy without gangrene: Secondary | ICD-10-CM | POA: Diagnosis not present

## 2022-12-31 DIAGNOSIS — I5032 Chronic diastolic (congestive) heart failure: Secondary | ICD-10-CM | POA: Diagnosis not present

## 2022-12-31 DIAGNOSIS — E1122 Type 2 diabetes mellitus with diabetic chronic kidney disease: Secondary | ICD-10-CM | POA: Diagnosis not present

## 2023-01-03 DIAGNOSIS — J449 Chronic obstructive pulmonary disease, unspecified: Secondary | ICD-10-CM | POA: Diagnosis not present

## 2023-01-03 DIAGNOSIS — I10 Essential (primary) hypertension: Secondary | ICD-10-CM | POA: Diagnosis not present

## 2023-01-03 DIAGNOSIS — N390 Urinary tract infection, site not specified: Secondary | ICD-10-CM | POA: Diagnosis not present

## 2023-01-03 DIAGNOSIS — Z1612 Extended spectrum beta lactamase (ESBL) resistance: Secondary | ICD-10-CM | POA: Diagnosis not present

## 2023-01-03 DIAGNOSIS — B961 Klebsiella pneumoniae [K. pneumoniae] as the cause of diseases classified elsewhere: Secondary | ICD-10-CM | POA: Diagnosis not present

## 2023-01-04 DIAGNOSIS — N390 Urinary tract infection, site not specified: Secondary | ICD-10-CM | POA: Diagnosis not present

## 2023-01-05 DIAGNOSIS — G4733 Obstructive sleep apnea (adult) (pediatric): Secondary | ICD-10-CM | POA: Diagnosis not present

## 2023-01-05 DIAGNOSIS — J4530 Mild persistent asthma, uncomplicated: Secondary | ICD-10-CM | POA: Diagnosis not present

## 2023-01-05 DIAGNOSIS — R911 Solitary pulmonary nodule: Secondary | ICD-10-CM | POA: Diagnosis not present

## 2023-01-05 DIAGNOSIS — N390 Urinary tract infection, site not specified: Secondary | ICD-10-CM | POA: Diagnosis not present

## 2023-01-05 DIAGNOSIS — J8410 Pulmonary fibrosis, unspecified: Secondary | ICD-10-CM | POA: Diagnosis not present

## 2023-01-06 DIAGNOSIS — J9601 Acute respiratory failure with hypoxia: Secondary | ICD-10-CM | POA: Diagnosis not present

## 2023-01-06 DIAGNOSIS — I130 Hypertensive heart and chronic kidney disease with heart failure and stage 1 through stage 4 chronic kidney disease, or unspecified chronic kidney disease: Secondary | ICD-10-CM | POA: Diagnosis not present

## 2023-01-06 DIAGNOSIS — I480 Paroxysmal atrial fibrillation: Secondary | ICD-10-CM | POA: Diagnosis not present

## 2023-01-06 DIAGNOSIS — N390 Urinary tract infection, site not specified: Secondary | ICD-10-CM | POA: Diagnosis not present

## 2023-01-06 DIAGNOSIS — E1151 Type 2 diabetes mellitus with diabetic peripheral angiopathy without gangrene: Secondary | ICD-10-CM | POA: Diagnosis not present

## 2023-01-06 DIAGNOSIS — J449 Chronic obstructive pulmonary disease, unspecified: Secondary | ICD-10-CM | POA: Diagnosis not present

## 2023-01-06 DIAGNOSIS — E1122 Type 2 diabetes mellitus with diabetic chronic kidney disease: Secondary | ICD-10-CM | POA: Diagnosis not present

## 2023-01-06 DIAGNOSIS — N1832 Chronic kidney disease, stage 3b: Secondary | ICD-10-CM | POA: Diagnosis not present

## 2023-01-06 DIAGNOSIS — I5032 Chronic diastolic (congestive) heart failure: Secondary | ICD-10-CM | POA: Diagnosis not present

## 2023-01-07 DIAGNOSIS — I130 Hypertensive heart and chronic kidney disease with heart failure and stage 1 through stage 4 chronic kidney disease, or unspecified chronic kidney disease: Secondary | ICD-10-CM | POA: Diagnosis not present

## 2023-01-07 DIAGNOSIS — E1122 Type 2 diabetes mellitus with diabetic chronic kidney disease: Secondary | ICD-10-CM | POA: Diagnosis not present

## 2023-01-07 DIAGNOSIS — I480 Paroxysmal atrial fibrillation: Secondary | ICD-10-CM | POA: Diagnosis not present

## 2023-01-07 DIAGNOSIS — J9601 Acute respiratory failure with hypoxia: Secondary | ICD-10-CM | POA: Diagnosis not present

## 2023-01-07 DIAGNOSIS — J449 Chronic obstructive pulmonary disease, unspecified: Secondary | ICD-10-CM | POA: Diagnosis not present

## 2023-01-07 DIAGNOSIS — E1151 Type 2 diabetes mellitus with diabetic peripheral angiopathy without gangrene: Secondary | ICD-10-CM | POA: Diagnosis not present

## 2023-01-07 DIAGNOSIS — N1832 Chronic kidney disease, stage 3b: Secondary | ICD-10-CM | POA: Diagnosis not present

## 2023-01-07 DIAGNOSIS — N390 Urinary tract infection, site not specified: Secondary | ICD-10-CM | POA: Diagnosis not present

## 2023-01-07 DIAGNOSIS — I5032 Chronic diastolic (congestive) heart failure: Secondary | ICD-10-CM | POA: Diagnosis not present

## 2023-01-07 IMAGING — MR MR LUMBAR SPINE W/O CM
4 of 5 series · 24 of 48 positions shown · non-contrast
Comparison: Lumbar spine MRI 01/04/2019

CLINICAL DATA: Back pain for several months, neurogenic
claudication

EXAM:
MRI LUMBAR SPINE WITHOUT CONTRAST
TECHNIQUE: Multiplanar, multisequence MR imaging of the lumbar spine was
performed. No intravenous contrast was administered.

[Series 3: T2 · sagittal · 4.0mm · 0.81mm/px · 6 of 17 slices shown (1 of 2)]
[im 1/17]
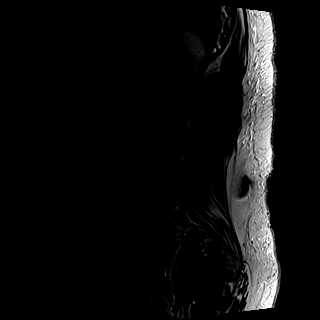
[im 4/17]
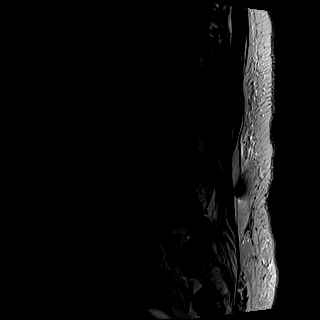
[im 7/17]
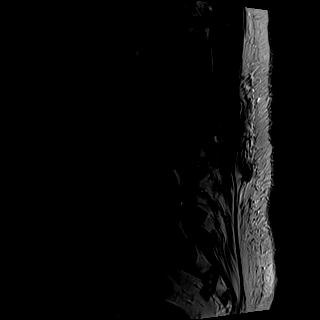
[im 10/17]
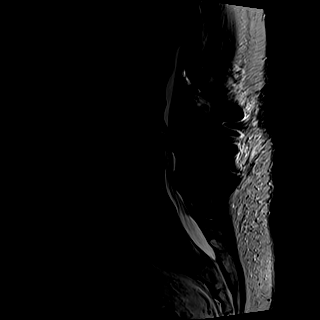
[im 13/17]
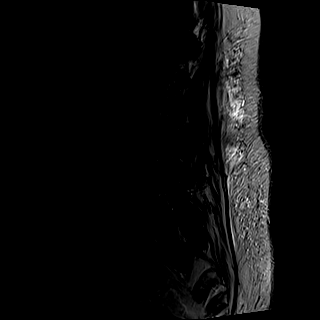
[im 17/17]
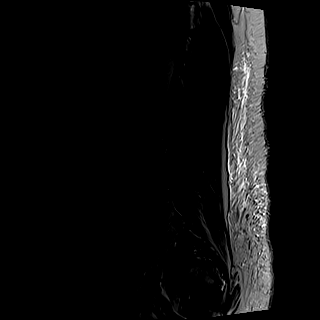

[Series 4: T1 · sagittal · 4.0mm · 0.41mm/px · 6 of 17 slices shown (1 of 2)]
[im 1/17]
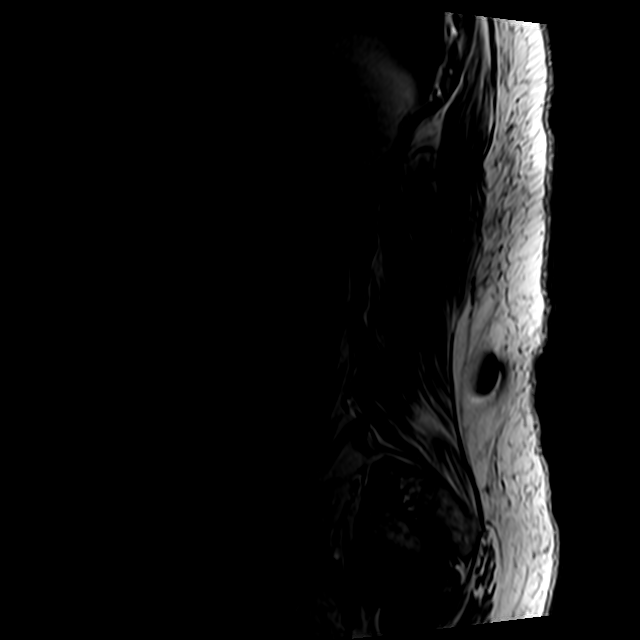
[im 4/17]
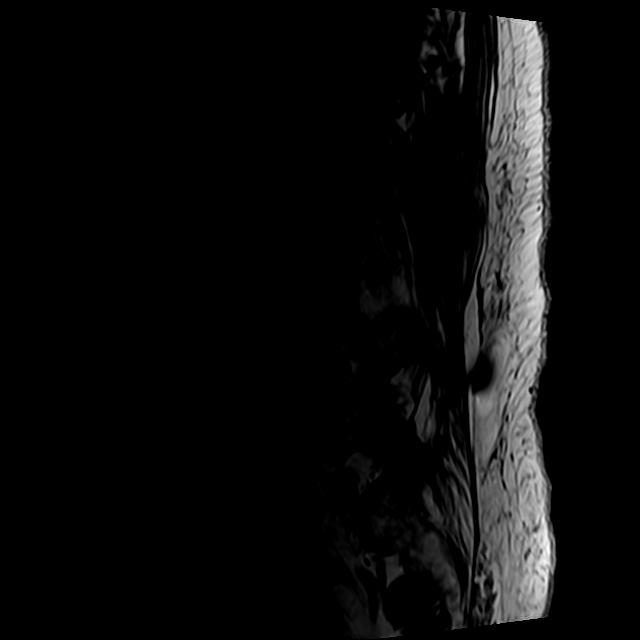
[im 7/17]
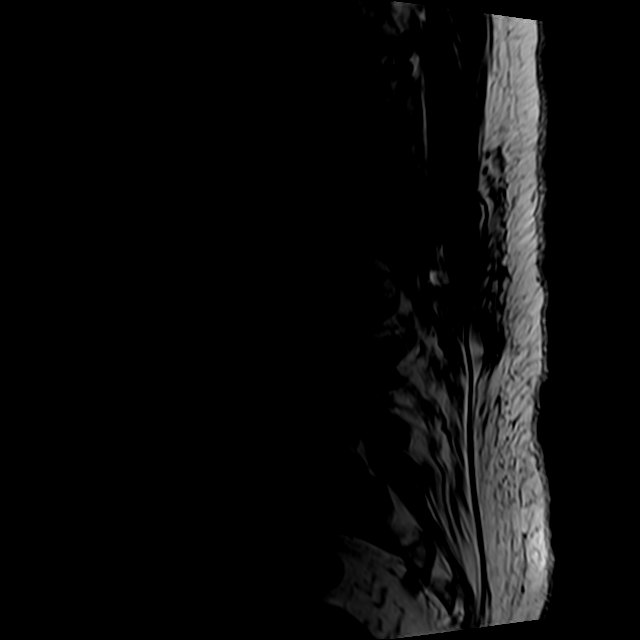
[im 10/17]
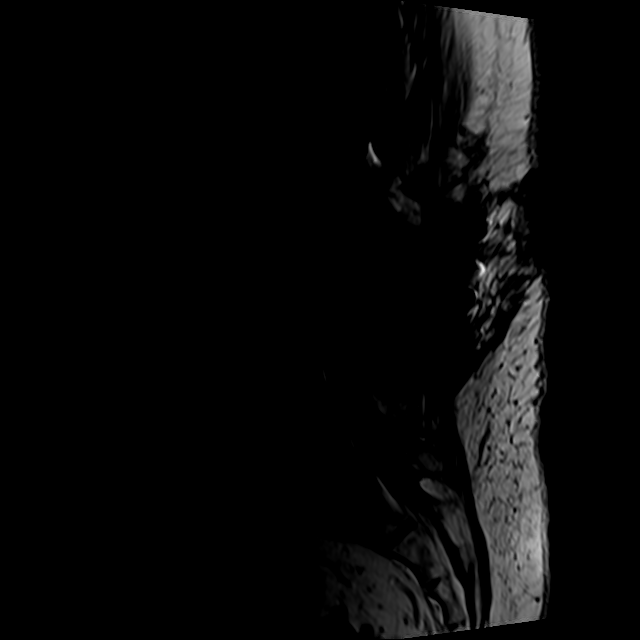
[im 13/17]
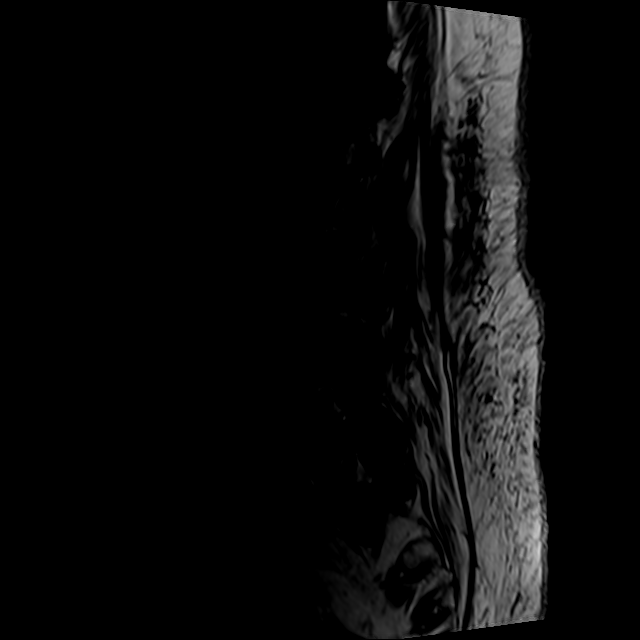
[im 17/17]
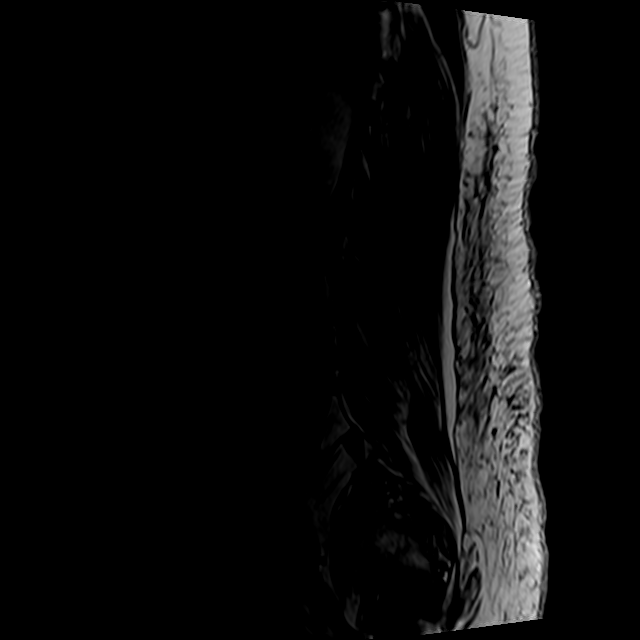

[Series 6: T2 · axial · 4.0mm · 0.78mm/px · z∈[-48,+195]mm · 9 of 44 slices shown (2 of 2)]
[im 1/44]
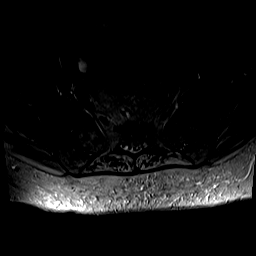
[im 7/44]
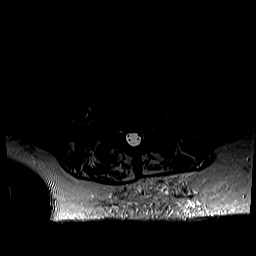
[im 13/44]
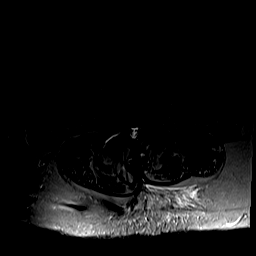
[im 19/44]
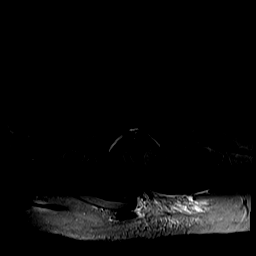
[im 22/44]
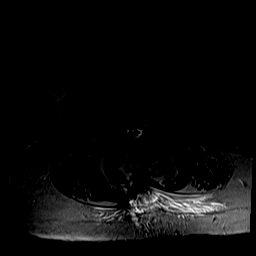
[im 25/44]
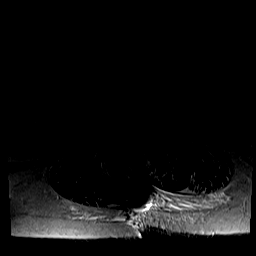
[im 31/44]
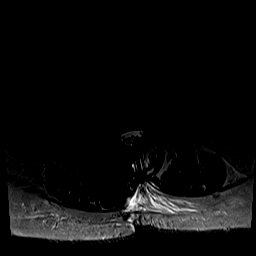
[im 37/44]
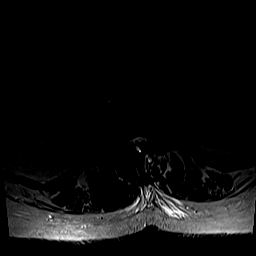
[im 44/44]
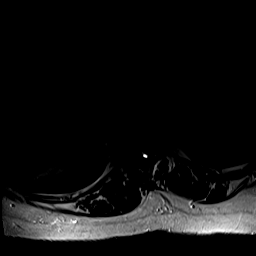

[Series 7: T1 · axial · 4.0mm · 0.39mm/px · z∈[-19,+160]mm · 3 of 44 slices shown (2 of 2)]
[im 7/44]
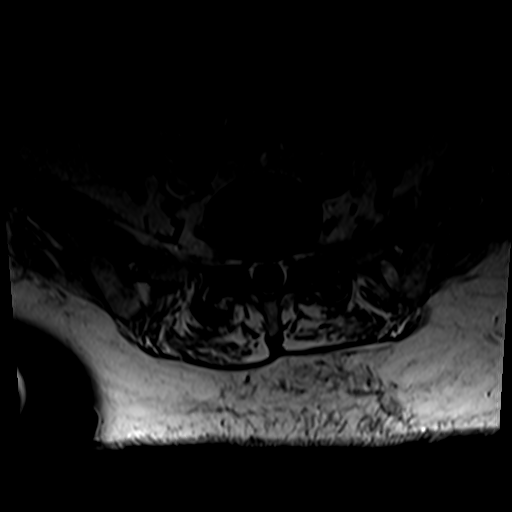
[im 22/44]
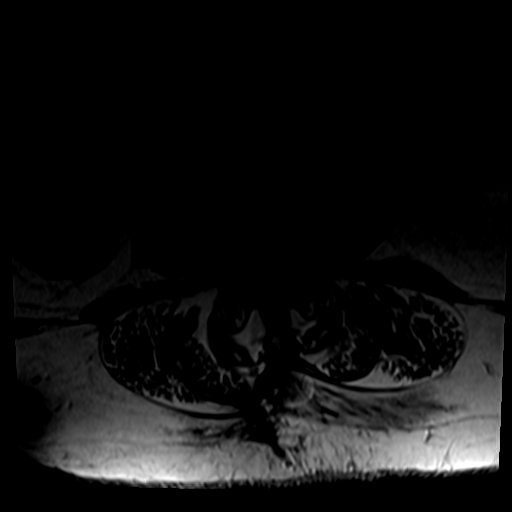
[im 37/44]
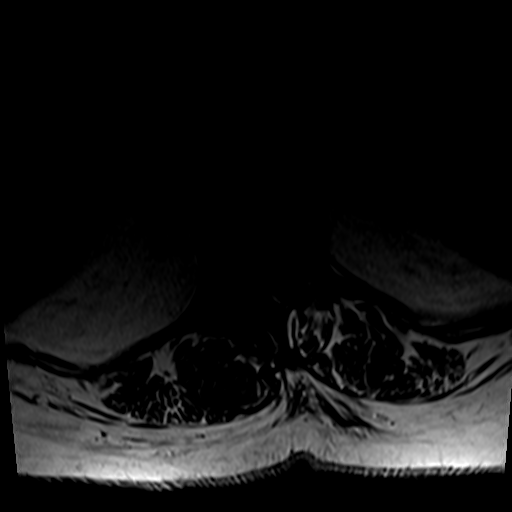

[24 of 48 positions shown; findings below may reference images not displayed]

FINDINGS: Segmentation: Standard; the lowest formed disc space is designated
L5-S1.

Alignment: Is grade 1 anterolisthesis of L2 on L3, slightly
increased since 6565. Grade 1 anterolisthesis of L4 on L5 is
unchanged. There is dextrocurvature centered at the thoracolumbar
junction.

Vertebrae: Vertebral body heights are preserved. Background marrow
signal is normal. There is degenerative endplate marrow signal
abnormality at L2-L3. There is T1 hypointensity with corresponding
STIR hyperintensity at T11-T12 with mild endplate irregularity, new
since [DATE].

Conus medullaris and cauda equina: Conus extends to the T12-L1
level. Conus and cauda equina appear normal.

Paraspinal and other soft tissues: A spinal cord stimulator is noted
in the subcutaneous fat in the right lower back with leads extending
into the thecal sac at the T12-L1 level.

Disc levels:

There is advanced disc desiccation and narrowing at T11-T12 and
L2-L3, progressed since 6565.

T11-T12: There is advanced disc space narrowing, degenerative
endplate irregularity, facet arthropathy, and a prominent disc bulge
resulting in moderate to severe spinal canal stenosis and severe
right worse than left neural foraminal stenosis, significantly
worsened since 6565

T12-L1: There is right worse than left facet arthropathy resulting
in mild right and no significant left neural foraminal stenosis and
no significant spinal canal stenosis progressed since 6565.

L1-L2: There is a mild disc bulge eccentric to the right and mild
bilateral facet arthropathy resulting in mild right and no
significant left neural foraminal stenosis and no significant spinal
canal stenosis.

L2-L3: There is a prominent diffuse disc bulge with bilateral
foraminal components, degenerative endplate spurring, and advanced
facet arthropathy with ligamentum flavum thickening resulting in
severe spinal canal stenosis with impingement of the cauda equina
nerve roots and severe bilateral neural foraminal stenosis
progressed since 6565.

L3-L4: There is a disc bulge and advanced bilateral facet
arthropathy with effusions resulting in moderate spinal canal
stenosis with narrowing of the subarticular zones and moderate to
severe left and mild right neural foraminal stenosis, progressed
since 6565.

L4-L5: There is grade 1 anterolisthesis with uncovering of the disc
posteriorly and advanced facet arthropathy with a right effusion
resulting in mild spinal canal stenosis with narrowing of the
bilateral subarticular zones and severe right and mild left neural
foraminal stenosis, similar to 6565

L5-S1: Mild bilateral facet arthropathy without significant spinal
canal or neural foraminal stenosis.
IMPRESSION: 1. Signal abnormality in the T11 and T12 vertebral bodies with mild
associated endplate irregularity is favored to be degenerative in
nature; however, discitis/osteomyelitis could have a similar
appearance. Recommend correlation with symptoms and inflammatory
markers. A disc bulge and bilateral facet arthropathy at this level
result in moderate to severe spinal canal stenosis and severe right
worse than left neural foraminal stenosis, significantly progressed
since [DATE]. Multifactorial severe spinal canal and bilateral neural foraminal
stenosis at L2-L3, progressed since [DATE]. Advanced facet arthropathy at L3-L4 and L4-L5 with associated
effusions. Moderate spinal canal and subarticular zone narrowing and
moderate to severe left neural foraminal stenosis at L3-L4 is
progressed since 6565, and mild spinal canal and subarticular zone
narrowing and severe right neural foraminal stenosis at L4-L5 is
similar to 6565.
4. Dextrocurvature centered at the thoracolumbar junction.

## 2023-01-08 DIAGNOSIS — I959 Hypotension, unspecified: Secondary | ICD-10-CM | POA: Diagnosis not present

## 2023-01-08 DIAGNOSIS — J449 Chronic obstructive pulmonary disease, unspecified: Secondary | ICD-10-CM | POA: Diagnosis not present

## 2023-01-08 DIAGNOSIS — J9601 Acute respiratory failure with hypoxia: Secondary | ICD-10-CM | POA: Diagnosis not present

## 2023-01-08 DIAGNOSIS — I10 Essential (primary) hypertension: Secondary | ICD-10-CM | POA: Diagnosis not present

## 2023-01-08 DIAGNOSIS — I480 Paroxysmal atrial fibrillation: Secondary | ICD-10-CM | POA: Diagnosis not present

## 2023-01-08 DIAGNOSIS — E1122 Type 2 diabetes mellitus with diabetic chronic kidney disease: Secondary | ICD-10-CM | POA: Diagnosis not present

## 2023-01-08 DIAGNOSIS — I5032 Chronic diastolic (congestive) heart failure: Secondary | ICD-10-CM | POA: Diagnosis not present

## 2023-01-08 DIAGNOSIS — I130 Hypertensive heart and chronic kidney disease with heart failure and stage 1 through stage 4 chronic kidney disease, or unspecified chronic kidney disease: Secondary | ICD-10-CM | POA: Diagnosis not present

## 2023-01-08 DIAGNOSIS — E860 Dehydration: Secondary | ICD-10-CM | POA: Diagnosis not present

## 2023-01-08 DIAGNOSIS — E1151 Type 2 diabetes mellitus with diabetic peripheral angiopathy without gangrene: Secondary | ICD-10-CM | POA: Diagnosis not present

## 2023-01-08 DIAGNOSIS — N1832 Chronic kidney disease, stage 3b: Secondary | ICD-10-CM | POA: Diagnosis not present

## 2023-01-09 DIAGNOSIS — N390 Urinary tract infection, site not specified: Secondary | ICD-10-CM | POA: Diagnosis not present

## 2023-01-12 DIAGNOSIS — E1151 Type 2 diabetes mellitus with diabetic peripheral angiopathy without gangrene: Secondary | ICD-10-CM | POA: Diagnosis not present

## 2023-01-12 DIAGNOSIS — J9601 Acute respiratory failure with hypoxia: Secondary | ICD-10-CM | POA: Diagnosis not present

## 2023-01-12 DIAGNOSIS — I480 Paroxysmal atrial fibrillation: Secondary | ICD-10-CM | POA: Diagnosis not present

## 2023-01-12 DIAGNOSIS — N1832 Chronic kidney disease, stage 3b: Secondary | ICD-10-CM | POA: Diagnosis not present

## 2023-01-12 DIAGNOSIS — J449 Chronic obstructive pulmonary disease, unspecified: Secondary | ICD-10-CM | POA: Diagnosis not present

## 2023-01-12 DIAGNOSIS — E1122 Type 2 diabetes mellitus with diabetic chronic kidney disease: Secondary | ICD-10-CM | POA: Diagnosis not present

## 2023-01-12 DIAGNOSIS — I5032 Chronic diastolic (congestive) heart failure: Secondary | ICD-10-CM | POA: Diagnosis not present

## 2023-01-12 DIAGNOSIS — I130 Hypertensive heart and chronic kidney disease with heart failure and stage 1 through stage 4 chronic kidney disease, or unspecified chronic kidney disease: Secondary | ICD-10-CM | POA: Diagnosis not present

## 2023-01-13 DIAGNOSIS — N1832 Chronic kidney disease, stage 3b: Secondary | ICD-10-CM | POA: Diagnosis not present

## 2023-01-13 DIAGNOSIS — E1122 Type 2 diabetes mellitus with diabetic chronic kidney disease: Secondary | ICD-10-CM | POA: Diagnosis not present

## 2023-01-13 DIAGNOSIS — J9601 Acute respiratory failure with hypoxia: Secondary | ICD-10-CM | POA: Diagnosis not present

## 2023-01-13 DIAGNOSIS — E1151 Type 2 diabetes mellitus with diabetic peripheral angiopathy without gangrene: Secondary | ICD-10-CM | POA: Diagnosis not present

## 2023-01-13 DIAGNOSIS — S32010A Wedge compression fracture of first lumbar vertebra, initial encounter for closed fracture: Secondary | ICD-10-CM | POA: Diagnosis not present

## 2023-01-13 DIAGNOSIS — M5441 Lumbago with sciatica, right side: Secondary | ICD-10-CM | POA: Diagnosis not present

## 2023-01-13 DIAGNOSIS — I130 Hypertensive heart and chronic kidney disease with heart failure and stage 1 through stage 4 chronic kidney disease, or unspecified chronic kidney disease: Secondary | ICD-10-CM | POA: Diagnosis not present

## 2023-01-13 DIAGNOSIS — M5442 Lumbago with sciatica, left side: Secondary | ICD-10-CM | POA: Diagnosis not present

## 2023-01-13 DIAGNOSIS — J449 Chronic obstructive pulmonary disease, unspecified: Secondary | ICD-10-CM | POA: Diagnosis not present

## 2023-01-13 DIAGNOSIS — G8929 Other chronic pain: Secondary | ICD-10-CM | POA: Diagnosis not present

## 2023-01-13 DIAGNOSIS — I480 Paroxysmal atrial fibrillation: Secondary | ICD-10-CM | POA: Diagnosis not present

## 2023-01-13 DIAGNOSIS — I5032 Chronic diastolic (congestive) heart failure: Secondary | ICD-10-CM | POA: Diagnosis not present

## 2023-01-19 DIAGNOSIS — N1832 Chronic kidney disease, stage 3b: Secondary | ICD-10-CM | POA: Diagnosis not present

## 2023-01-19 DIAGNOSIS — E1151 Type 2 diabetes mellitus with diabetic peripheral angiopathy without gangrene: Secondary | ICD-10-CM | POA: Diagnosis not present

## 2023-01-19 DIAGNOSIS — E1122 Type 2 diabetes mellitus with diabetic chronic kidney disease: Secondary | ICD-10-CM | POA: Diagnosis not present

## 2023-01-19 DIAGNOSIS — I130 Hypertensive heart and chronic kidney disease with heart failure and stage 1 through stage 4 chronic kidney disease, or unspecified chronic kidney disease: Secondary | ICD-10-CM | POA: Diagnosis not present

## 2023-01-19 DIAGNOSIS — J9601 Acute respiratory failure with hypoxia: Secondary | ICD-10-CM | POA: Diagnosis not present

## 2023-01-19 DIAGNOSIS — I480 Paroxysmal atrial fibrillation: Secondary | ICD-10-CM | POA: Diagnosis not present

## 2023-01-19 DIAGNOSIS — I5032 Chronic diastolic (congestive) heart failure: Secondary | ICD-10-CM | POA: Diagnosis not present

## 2023-01-19 DIAGNOSIS — J449 Chronic obstructive pulmonary disease, unspecified: Secondary | ICD-10-CM | POA: Diagnosis not present

## 2023-01-20 DIAGNOSIS — R339 Retention of urine, unspecified: Secondary | ICD-10-CM | POA: Diagnosis not present

## 2023-01-20 DIAGNOSIS — N390 Urinary tract infection, site not specified: Secondary | ICD-10-CM | POA: Diagnosis not present

## 2023-01-21 DIAGNOSIS — E1122 Type 2 diabetes mellitus with diabetic chronic kidney disease: Secondary | ICD-10-CM | POA: Diagnosis not present

## 2023-01-21 DIAGNOSIS — I130 Hypertensive heart and chronic kidney disease with heart failure and stage 1 through stage 4 chronic kidney disease, or unspecified chronic kidney disease: Secondary | ICD-10-CM | POA: Diagnosis not present

## 2023-01-21 DIAGNOSIS — J449 Chronic obstructive pulmonary disease, unspecified: Secondary | ICD-10-CM | POA: Diagnosis not present

## 2023-01-21 DIAGNOSIS — J9601 Acute respiratory failure with hypoxia: Secondary | ICD-10-CM | POA: Diagnosis not present

## 2023-01-21 DIAGNOSIS — I5032 Chronic diastolic (congestive) heart failure: Secondary | ICD-10-CM | POA: Diagnosis not present

## 2023-01-21 DIAGNOSIS — N1832 Chronic kidney disease, stage 3b: Secondary | ICD-10-CM | POA: Diagnosis not present

## 2023-01-21 DIAGNOSIS — I480 Paroxysmal atrial fibrillation: Secondary | ICD-10-CM | POA: Diagnosis not present

## 2023-01-21 DIAGNOSIS — E1151 Type 2 diabetes mellitus with diabetic peripheral angiopathy without gangrene: Secondary | ICD-10-CM | POA: Diagnosis not present

## 2023-01-22 DIAGNOSIS — M21372 Foot drop, left foot: Secondary | ICD-10-CM | POA: Diagnosis not present

## 2023-01-25 DIAGNOSIS — E1151 Type 2 diabetes mellitus with diabetic peripheral angiopathy without gangrene: Secondary | ICD-10-CM | POA: Diagnosis not present

## 2023-01-25 DIAGNOSIS — I5032 Chronic diastolic (congestive) heart failure: Secondary | ICD-10-CM | POA: Diagnosis not present

## 2023-01-25 DIAGNOSIS — E1122 Type 2 diabetes mellitus with diabetic chronic kidney disease: Secondary | ICD-10-CM | POA: Diagnosis not present

## 2023-01-25 DIAGNOSIS — I480 Paroxysmal atrial fibrillation: Secondary | ICD-10-CM | POA: Diagnosis not present

## 2023-01-25 DIAGNOSIS — I130 Hypertensive heart and chronic kidney disease with heart failure and stage 1 through stage 4 chronic kidney disease, or unspecified chronic kidney disease: Secondary | ICD-10-CM | POA: Diagnosis not present

## 2023-01-25 DIAGNOSIS — J9601 Acute respiratory failure with hypoxia: Secondary | ICD-10-CM | POA: Diagnosis not present

## 2023-01-25 DIAGNOSIS — N1832 Chronic kidney disease, stage 3b: Secondary | ICD-10-CM | POA: Diagnosis not present

## 2023-01-25 DIAGNOSIS — J449 Chronic obstructive pulmonary disease, unspecified: Secondary | ICD-10-CM | POA: Diagnosis not present

## 2023-01-26 DIAGNOSIS — I480 Paroxysmal atrial fibrillation: Secondary | ICD-10-CM | POA: Diagnosis not present

## 2023-01-26 DIAGNOSIS — E1122 Type 2 diabetes mellitus with diabetic chronic kidney disease: Secondary | ICD-10-CM | POA: Diagnosis not present

## 2023-01-26 DIAGNOSIS — I130 Hypertensive heart and chronic kidney disease with heart failure and stage 1 through stage 4 chronic kidney disease, or unspecified chronic kidney disease: Secondary | ICD-10-CM | POA: Diagnosis not present

## 2023-01-26 DIAGNOSIS — E1151 Type 2 diabetes mellitus with diabetic peripheral angiopathy without gangrene: Secondary | ICD-10-CM | POA: Diagnosis not present

## 2023-01-26 DIAGNOSIS — J9601 Acute respiratory failure with hypoxia: Secondary | ICD-10-CM | POA: Diagnosis not present

## 2023-01-26 DIAGNOSIS — N1832 Chronic kidney disease, stage 3b: Secondary | ICD-10-CM | POA: Diagnosis not present

## 2023-01-26 DIAGNOSIS — J449 Chronic obstructive pulmonary disease, unspecified: Secondary | ICD-10-CM | POA: Diagnosis not present

## 2023-01-26 DIAGNOSIS — I5032 Chronic diastolic (congestive) heart failure: Secondary | ICD-10-CM | POA: Diagnosis not present

## 2023-01-27 DIAGNOSIS — J449 Chronic obstructive pulmonary disease, unspecified: Secondary | ICD-10-CM | POA: Diagnosis not present

## 2023-01-27 DIAGNOSIS — J9601 Acute respiratory failure with hypoxia: Secondary | ICD-10-CM | POA: Diagnosis not present

## 2023-01-27 DIAGNOSIS — I130 Hypertensive heart and chronic kidney disease with heart failure and stage 1 through stage 4 chronic kidney disease, or unspecified chronic kidney disease: Secondary | ICD-10-CM | POA: Diagnosis not present

## 2023-01-27 DIAGNOSIS — E1122 Type 2 diabetes mellitus with diabetic chronic kidney disease: Secondary | ICD-10-CM | POA: Diagnosis not present

## 2023-01-27 DIAGNOSIS — I5032 Chronic diastolic (congestive) heart failure: Secondary | ICD-10-CM | POA: Diagnosis not present

## 2023-01-27 DIAGNOSIS — E1151 Type 2 diabetes mellitus with diabetic peripheral angiopathy without gangrene: Secondary | ICD-10-CM | POA: Diagnosis not present

## 2023-01-27 DIAGNOSIS — N1832 Chronic kidney disease, stage 3b: Secondary | ICD-10-CM | POA: Diagnosis not present

## 2023-01-27 DIAGNOSIS — I480 Paroxysmal atrial fibrillation: Secondary | ICD-10-CM | POA: Diagnosis not present

## 2023-01-28 DIAGNOSIS — J449 Chronic obstructive pulmonary disease, unspecified: Secondary | ICD-10-CM | POA: Diagnosis not present

## 2023-01-28 DIAGNOSIS — I5032 Chronic diastolic (congestive) heart failure: Secondary | ICD-10-CM | POA: Diagnosis not present

## 2023-01-28 DIAGNOSIS — N1832 Chronic kidney disease, stage 3b: Secondary | ICD-10-CM | POA: Diagnosis not present

## 2023-01-28 DIAGNOSIS — I130 Hypertensive heart and chronic kidney disease with heart failure and stage 1 through stage 4 chronic kidney disease, or unspecified chronic kidney disease: Secondary | ICD-10-CM | POA: Diagnosis not present

## 2023-01-28 DIAGNOSIS — I480 Paroxysmal atrial fibrillation: Secondary | ICD-10-CM | POA: Diagnosis not present

## 2023-01-28 DIAGNOSIS — J9601 Acute respiratory failure with hypoxia: Secondary | ICD-10-CM | POA: Diagnosis not present

## 2023-01-28 DIAGNOSIS — E1122 Type 2 diabetes mellitus with diabetic chronic kidney disease: Secondary | ICD-10-CM | POA: Diagnosis not present

## 2023-01-28 DIAGNOSIS — E1151 Type 2 diabetes mellitus with diabetic peripheral angiopathy without gangrene: Secondary | ICD-10-CM | POA: Diagnosis not present

## 2023-02-02 DIAGNOSIS — I130 Hypertensive heart and chronic kidney disease with heart failure and stage 1 through stage 4 chronic kidney disease, or unspecified chronic kidney disease: Secondary | ICD-10-CM | POA: Diagnosis not present

## 2023-02-02 DIAGNOSIS — E1122 Type 2 diabetes mellitus with diabetic chronic kidney disease: Secondary | ICD-10-CM | POA: Diagnosis not present

## 2023-02-02 DIAGNOSIS — I480 Paroxysmal atrial fibrillation: Secondary | ICD-10-CM | POA: Diagnosis not present

## 2023-02-02 DIAGNOSIS — E1151 Type 2 diabetes mellitus with diabetic peripheral angiopathy without gangrene: Secondary | ICD-10-CM | POA: Diagnosis not present

## 2023-02-02 DIAGNOSIS — J9601 Acute respiratory failure with hypoxia: Secondary | ICD-10-CM | POA: Diagnosis not present

## 2023-02-02 DIAGNOSIS — I5032 Chronic diastolic (congestive) heart failure: Secondary | ICD-10-CM | POA: Diagnosis not present

## 2023-02-02 DIAGNOSIS — J449 Chronic obstructive pulmonary disease, unspecified: Secondary | ICD-10-CM | POA: Diagnosis not present

## 2023-02-02 DIAGNOSIS — N1832 Chronic kidney disease, stage 3b: Secondary | ICD-10-CM | POA: Diagnosis not present

## 2023-02-03 DIAGNOSIS — J9601 Acute respiratory failure with hypoxia: Secondary | ICD-10-CM | POA: Diagnosis not present

## 2023-02-03 DIAGNOSIS — I480 Paroxysmal atrial fibrillation: Secondary | ICD-10-CM | POA: Diagnosis not present

## 2023-02-03 DIAGNOSIS — E1151 Type 2 diabetes mellitus with diabetic peripheral angiopathy without gangrene: Secondary | ICD-10-CM | POA: Diagnosis not present

## 2023-02-03 DIAGNOSIS — N1832 Chronic kidney disease, stage 3b: Secondary | ICD-10-CM | POA: Diagnosis not present

## 2023-02-03 DIAGNOSIS — I5032 Chronic diastolic (congestive) heart failure: Secondary | ICD-10-CM | POA: Diagnosis not present

## 2023-02-03 DIAGNOSIS — E1122 Type 2 diabetes mellitus with diabetic chronic kidney disease: Secondary | ICD-10-CM | POA: Diagnosis not present

## 2023-02-03 DIAGNOSIS — I130 Hypertensive heart and chronic kidney disease with heart failure and stage 1 through stage 4 chronic kidney disease, or unspecified chronic kidney disease: Secondary | ICD-10-CM | POA: Diagnosis not present

## 2023-02-03 DIAGNOSIS — J449 Chronic obstructive pulmonary disease, unspecified: Secondary | ICD-10-CM | POA: Diagnosis not present

## 2023-02-04 DIAGNOSIS — E1122 Type 2 diabetes mellitus with diabetic chronic kidney disease: Secondary | ICD-10-CM | POA: Diagnosis not present

## 2023-02-04 DIAGNOSIS — J449 Chronic obstructive pulmonary disease, unspecified: Secondary | ICD-10-CM | POA: Diagnosis not present

## 2023-02-04 DIAGNOSIS — N1832 Chronic kidney disease, stage 3b: Secondary | ICD-10-CM | POA: Diagnosis not present

## 2023-02-04 DIAGNOSIS — I130 Hypertensive heart and chronic kidney disease with heart failure and stage 1 through stage 4 chronic kidney disease, or unspecified chronic kidney disease: Secondary | ICD-10-CM | POA: Diagnosis not present

## 2023-02-04 DIAGNOSIS — J9601 Acute respiratory failure with hypoxia: Secondary | ICD-10-CM | POA: Diagnosis not present

## 2023-02-04 DIAGNOSIS — I480 Paroxysmal atrial fibrillation: Secondary | ICD-10-CM | POA: Diagnosis not present

## 2023-02-04 DIAGNOSIS — E1151 Type 2 diabetes mellitus with diabetic peripheral angiopathy without gangrene: Secondary | ICD-10-CM | POA: Diagnosis not present

## 2023-02-04 DIAGNOSIS — I5032 Chronic diastolic (congestive) heart failure: Secondary | ICD-10-CM | POA: Diagnosis not present

## 2023-02-09 DIAGNOSIS — I130 Hypertensive heart and chronic kidney disease with heart failure and stage 1 through stage 4 chronic kidney disease, or unspecified chronic kidney disease: Secondary | ICD-10-CM | POA: Diagnosis not present

## 2023-02-09 DIAGNOSIS — E1151 Type 2 diabetes mellitus with diabetic peripheral angiopathy without gangrene: Secondary | ICD-10-CM | POA: Diagnosis not present

## 2023-02-09 DIAGNOSIS — J9601 Acute respiratory failure with hypoxia: Secondary | ICD-10-CM | POA: Diagnosis not present

## 2023-02-09 DIAGNOSIS — I480 Paroxysmal atrial fibrillation: Secondary | ICD-10-CM | POA: Diagnosis not present

## 2023-02-09 DIAGNOSIS — E1122 Type 2 diabetes mellitus with diabetic chronic kidney disease: Secondary | ICD-10-CM | POA: Diagnosis not present

## 2023-02-09 DIAGNOSIS — I5032 Chronic diastolic (congestive) heart failure: Secondary | ICD-10-CM | POA: Diagnosis not present

## 2023-02-09 DIAGNOSIS — J449 Chronic obstructive pulmonary disease, unspecified: Secondary | ICD-10-CM | POA: Diagnosis not present

## 2023-02-09 DIAGNOSIS — N1832 Chronic kidney disease, stage 3b: Secondary | ICD-10-CM | POA: Diagnosis not present

## 2023-02-10 DIAGNOSIS — I5032 Chronic diastolic (congestive) heart failure: Secondary | ICD-10-CM | POA: Diagnosis not present

## 2023-02-10 DIAGNOSIS — N1832 Chronic kidney disease, stage 3b: Secondary | ICD-10-CM | POA: Diagnosis not present

## 2023-02-10 DIAGNOSIS — I480 Paroxysmal atrial fibrillation: Secondary | ICD-10-CM | POA: Diagnosis not present

## 2023-02-10 DIAGNOSIS — E1122 Type 2 diabetes mellitus with diabetic chronic kidney disease: Secondary | ICD-10-CM | POA: Diagnosis not present

## 2023-02-10 DIAGNOSIS — J449 Chronic obstructive pulmonary disease, unspecified: Secondary | ICD-10-CM | POA: Diagnosis not present

## 2023-02-10 DIAGNOSIS — E1151 Type 2 diabetes mellitus with diabetic peripheral angiopathy without gangrene: Secondary | ICD-10-CM | POA: Diagnosis not present

## 2023-02-10 DIAGNOSIS — I130 Hypertensive heart and chronic kidney disease with heart failure and stage 1 through stage 4 chronic kidney disease, or unspecified chronic kidney disease: Secondary | ICD-10-CM | POA: Diagnosis not present

## 2023-02-10 DIAGNOSIS — J9601 Acute respiratory failure with hypoxia: Secondary | ICD-10-CM | POA: Diagnosis not present

## 2023-02-11 DIAGNOSIS — I480 Paroxysmal atrial fibrillation: Secondary | ICD-10-CM | POA: Diagnosis not present

## 2023-02-11 DIAGNOSIS — E1122 Type 2 diabetes mellitus with diabetic chronic kidney disease: Secondary | ICD-10-CM | POA: Diagnosis not present

## 2023-02-11 DIAGNOSIS — E1151 Type 2 diabetes mellitus with diabetic peripheral angiopathy without gangrene: Secondary | ICD-10-CM | POA: Diagnosis not present

## 2023-02-11 DIAGNOSIS — L602 Onychogryphosis: Secondary | ICD-10-CM | POA: Diagnosis not present

## 2023-02-11 DIAGNOSIS — J449 Chronic obstructive pulmonary disease, unspecified: Secondary | ICD-10-CM | POA: Diagnosis not present

## 2023-02-11 DIAGNOSIS — N1832 Chronic kidney disease, stage 3b: Secondary | ICD-10-CM | POA: Diagnosis not present

## 2023-02-11 DIAGNOSIS — L600 Ingrowing nail: Secondary | ICD-10-CM | POA: Diagnosis not present

## 2023-02-11 DIAGNOSIS — M79675 Pain in left toe(s): Secondary | ICD-10-CM | POA: Diagnosis not present

## 2023-02-11 DIAGNOSIS — I130 Hypertensive heart and chronic kidney disease with heart failure and stage 1 through stage 4 chronic kidney disease, or unspecified chronic kidney disease: Secondary | ICD-10-CM | POA: Diagnosis not present

## 2023-02-11 DIAGNOSIS — J9601 Acute respiratory failure with hypoxia: Secondary | ICD-10-CM | POA: Diagnosis not present

## 2023-02-11 DIAGNOSIS — I5032 Chronic diastolic (congestive) heart failure: Secondary | ICD-10-CM | POA: Diagnosis not present

## 2023-02-17 DIAGNOSIS — J449 Chronic obstructive pulmonary disease, unspecified: Secondary | ICD-10-CM | POA: Diagnosis not present

## 2023-02-17 DIAGNOSIS — J9601 Acute respiratory failure with hypoxia: Secondary | ICD-10-CM | POA: Diagnosis not present

## 2023-02-17 DIAGNOSIS — I480 Paroxysmal atrial fibrillation: Secondary | ICD-10-CM | POA: Diagnosis not present

## 2023-02-17 DIAGNOSIS — N1832 Chronic kidney disease, stage 3b: Secondary | ICD-10-CM | POA: Diagnosis not present

## 2023-02-17 DIAGNOSIS — I130 Hypertensive heart and chronic kidney disease with heart failure and stage 1 through stage 4 chronic kidney disease, or unspecified chronic kidney disease: Secondary | ICD-10-CM | POA: Diagnosis not present

## 2023-02-17 DIAGNOSIS — E1122 Type 2 diabetes mellitus with diabetic chronic kidney disease: Secondary | ICD-10-CM | POA: Diagnosis not present

## 2023-02-17 DIAGNOSIS — I5032 Chronic diastolic (congestive) heart failure: Secondary | ICD-10-CM | POA: Diagnosis not present

## 2023-02-17 DIAGNOSIS — E1151 Type 2 diabetes mellitus with diabetic peripheral angiopathy without gangrene: Secondary | ICD-10-CM | POA: Diagnosis not present

## 2023-02-18 DIAGNOSIS — J9601 Acute respiratory failure with hypoxia: Secondary | ICD-10-CM | POA: Diagnosis not present

## 2023-02-18 DIAGNOSIS — I5032 Chronic diastolic (congestive) heart failure: Secondary | ICD-10-CM | POA: Diagnosis not present

## 2023-02-18 DIAGNOSIS — E1122 Type 2 diabetes mellitus with diabetic chronic kidney disease: Secondary | ICD-10-CM | POA: Diagnosis not present

## 2023-02-18 DIAGNOSIS — J449 Chronic obstructive pulmonary disease, unspecified: Secondary | ICD-10-CM | POA: Diagnosis not present

## 2023-02-18 DIAGNOSIS — I480 Paroxysmal atrial fibrillation: Secondary | ICD-10-CM | POA: Diagnosis not present

## 2023-02-18 DIAGNOSIS — I130 Hypertensive heart and chronic kidney disease with heart failure and stage 1 through stage 4 chronic kidney disease, or unspecified chronic kidney disease: Secondary | ICD-10-CM | POA: Diagnosis not present

## 2023-02-18 DIAGNOSIS — N1832 Chronic kidney disease, stage 3b: Secondary | ICD-10-CM | POA: Diagnosis not present

## 2023-02-18 DIAGNOSIS — E1151 Type 2 diabetes mellitus with diabetic peripheral angiopathy without gangrene: Secondary | ICD-10-CM | POA: Diagnosis not present

## 2023-02-22 DIAGNOSIS — I5032 Chronic diastolic (congestive) heart failure: Secondary | ICD-10-CM | POA: Diagnosis not present

## 2023-02-22 DIAGNOSIS — J449 Chronic obstructive pulmonary disease, unspecified: Secondary | ICD-10-CM | POA: Diagnosis not present

## 2023-02-22 DIAGNOSIS — I480 Paroxysmal atrial fibrillation: Secondary | ICD-10-CM | POA: Diagnosis not present

## 2023-02-22 DIAGNOSIS — E1122 Type 2 diabetes mellitus with diabetic chronic kidney disease: Secondary | ICD-10-CM | POA: Diagnosis not present

## 2023-02-22 DIAGNOSIS — N1832 Chronic kidney disease, stage 3b: Secondary | ICD-10-CM | POA: Diagnosis not present

## 2023-02-22 DIAGNOSIS — I130 Hypertensive heart and chronic kidney disease with heart failure and stage 1 through stage 4 chronic kidney disease, or unspecified chronic kidney disease: Secondary | ICD-10-CM | POA: Diagnosis not present

## 2023-02-22 DIAGNOSIS — J9601 Acute respiratory failure with hypoxia: Secondary | ICD-10-CM | POA: Diagnosis not present

## 2023-02-22 DIAGNOSIS — E1151 Type 2 diabetes mellitus with diabetic peripheral angiopathy without gangrene: Secondary | ICD-10-CM | POA: Diagnosis not present

## 2023-02-23 DIAGNOSIS — R339 Retention of urine, unspecified: Secondary | ICD-10-CM | POA: Diagnosis not present

## 2023-02-23 DIAGNOSIS — J449 Chronic obstructive pulmonary disease, unspecified: Secondary | ICD-10-CM | POA: Diagnosis not present

## 2023-02-23 DIAGNOSIS — E1151 Type 2 diabetes mellitus with diabetic peripheral angiopathy without gangrene: Secondary | ICD-10-CM | POA: Diagnosis not present

## 2023-02-23 DIAGNOSIS — J9601 Acute respiratory failure with hypoxia: Secondary | ICD-10-CM | POA: Diagnosis not present

## 2023-02-23 DIAGNOSIS — I130 Hypertensive heart and chronic kidney disease with heart failure and stage 1 through stage 4 chronic kidney disease, or unspecified chronic kidney disease: Secondary | ICD-10-CM | POA: Diagnosis not present

## 2023-02-23 DIAGNOSIS — I5032 Chronic diastolic (congestive) heart failure: Secondary | ICD-10-CM | POA: Diagnosis not present

## 2023-02-23 DIAGNOSIS — N1832 Chronic kidney disease, stage 3b: Secondary | ICD-10-CM | POA: Diagnosis not present

## 2023-02-23 DIAGNOSIS — I480 Paroxysmal atrial fibrillation: Secondary | ICD-10-CM | POA: Diagnosis not present

## 2023-02-23 DIAGNOSIS — E1122 Type 2 diabetes mellitus with diabetic chronic kidney disease: Secondary | ICD-10-CM | POA: Diagnosis not present

## 2023-02-25 DIAGNOSIS — E1151 Type 2 diabetes mellitus with diabetic peripheral angiopathy without gangrene: Secondary | ICD-10-CM | POA: Diagnosis not present

## 2023-02-25 DIAGNOSIS — I130 Hypertensive heart and chronic kidney disease with heart failure and stage 1 through stage 4 chronic kidney disease, or unspecified chronic kidney disease: Secondary | ICD-10-CM | POA: Diagnosis not present

## 2023-02-25 DIAGNOSIS — I5032 Chronic diastolic (congestive) heart failure: Secondary | ICD-10-CM | POA: Diagnosis not present

## 2023-02-25 DIAGNOSIS — J9601 Acute respiratory failure with hypoxia: Secondary | ICD-10-CM | POA: Diagnosis not present

## 2023-02-25 DIAGNOSIS — I480 Paroxysmal atrial fibrillation: Secondary | ICD-10-CM | POA: Diagnosis not present

## 2023-02-25 DIAGNOSIS — J449 Chronic obstructive pulmonary disease, unspecified: Secondary | ICD-10-CM | POA: Diagnosis not present

## 2023-02-25 DIAGNOSIS — E1122 Type 2 diabetes mellitus with diabetic chronic kidney disease: Secondary | ICD-10-CM | POA: Diagnosis not present

## 2023-02-25 DIAGNOSIS — N1832 Chronic kidney disease, stage 3b: Secondary | ICD-10-CM | POA: Diagnosis not present

## 2023-02-26 DIAGNOSIS — J9601 Acute respiratory failure with hypoxia: Secondary | ICD-10-CM | POA: Diagnosis not present

## 2023-02-26 DIAGNOSIS — I130 Hypertensive heart and chronic kidney disease with heart failure and stage 1 through stage 4 chronic kidney disease, or unspecified chronic kidney disease: Secondary | ICD-10-CM | POA: Diagnosis not present

## 2023-02-26 DIAGNOSIS — E1151 Type 2 diabetes mellitus with diabetic peripheral angiopathy without gangrene: Secondary | ICD-10-CM | POA: Diagnosis not present

## 2023-02-26 DIAGNOSIS — N1832 Chronic kidney disease, stage 3b: Secondary | ICD-10-CM | POA: Diagnosis not present

## 2023-02-26 DIAGNOSIS — J449 Chronic obstructive pulmonary disease, unspecified: Secondary | ICD-10-CM | POA: Diagnosis not present

## 2023-02-26 DIAGNOSIS — I480 Paroxysmal atrial fibrillation: Secondary | ICD-10-CM | POA: Diagnosis not present

## 2023-02-26 DIAGNOSIS — E1122 Type 2 diabetes mellitus with diabetic chronic kidney disease: Secondary | ICD-10-CM | POA: Diagnosis not present

## 2023-02-26 DIAGNOSIS — I5032 Chronic diastolic (congestive) heart failure: Secondary | ICD-10-CM | POA: Diagnosis not present

## 2023-03-01 DIAGNOSIS — M5441 Lumbago with sciatica, right side: Secondary | ICD-10-CM | POA: Diagnosis not present

## 2023-03-01 DIAGNOSIS — N183 Chronic kidney disease, stage 3 unspecified: Secondary | ICD-10-CM | POA: Diagnosis not present

## 2023-03-01 DIAGNOSIS — I1310 Hypertensive heart and chronic kidney disease without heart failure, with stage 1 through stage 4 chronic kidney disease, or unspecified chronic kidney disease: Secondary | ICD-10-CM | POA: Diagnosis not present

## 2023-03-01 DIAGNOSIS — R1310 Dysphagia, unspecified: Secondary | ICD-10-CM | POA: Diagnosis not present

## 2023-03-01 DIAGNOSIS — E785 Hyperlipidemia, unspecified: Secondary | ICD-10-CM | POA: Diagnosis not present

## 2023-03-01 DIAGNOSIS — M5442 Lumbago with sciatica, left side: Secondary | ICD-10-CM | POA: Diagnosis not present

## 2023-03-01 DIAGNOSIS — I480 Paroxysmal atrial fibrillation: Secondary | ICD-10-CM | POA: Diagnosis not present

## 2023-03-01 DIAGNOSIS — E1122 Type 2 diabetes mellitus with diabetic chronic kidney disease: Secondary | ICD-10-CM | POA: Diagnosis not present

## 2023-03-01 DIAGNOSIS — J449 Chronic obstructive pulmonary disease, unspecified: Secondary | ICD-10-CM | POA: Diagnosis not present

## 2023-03-02 DIAGNOSIS — J9601 Acute respiratory failure with hypoxia: Secondary | ICD-10-CM | POA: Diagnosis not present

## 2023-03-02 DIAGNOSIS — E1122 Type 2 diabetes mellitus with diabetic chronic kidney disease: Secondary | ICD-10-CM | POA: Diagnosis not present

## 2023-03-02 DIAGNOSIS — I5032 Chronic diastolic (congestive) heart failure: Secondary | ICD-10-CM | POA: Diagnosis not present

## 2023-03-02 DIAGNOSIS — E1151 Type 2 diabetes mellitus with diabetic peripheral angiopathy without gangrene: Secondary | ICD-10-CM | POA: Diagnosis not present

## 2023-03-02 DIAGNOSIS — J449 Chronic obstructive pulmonary disease, unspecified: Secondary | ICD-10-CM | POA: Diagnosis not present

## 2023-03-02 DIAGNOSIS — N1832 Chronic kidney disease, stage 3b: Secondary | ICD-10-CM | POA: Diagnosis not present

## 2023-03-02 DIAGNOSIS — I130 Hypertensive heart and chronic kidney disease with heart failure and stage 1 through stage 4 chronic kidney disease, or unspecified chronic kidney disease: Secondary | ICD-10-CM | POA: Diagnosis not present

## 2023-03-02 DIAGNOSIS — I480 Paroxysmal atrial fibrillation: Secondary | ICD-10-CM | POA: Diagnosis not present

## 2023-03-05 DIAGNOSIS — J9601 Acute respiratory failure with hypoxia: Secondary | ICD-10-CM | POA: Diagnosis not present

## 2023-03-05 DIAGNOSIS — I130 Hypertensive heart and chronic kidney disease with heart failure and stage 1 through stage 4 chronic kidney disease, or unspecified chronic kidney disease: Secondary | ICD-10-CM | POA: Diagnosis not present

## 2023-03-05 DIAGNOSIS — E1151 Type 2 diabetes mellitus with diabetic peripheral angiopathy without gangrene: Secondary | ICD-10-CM | POA: Diagnosis not present

## 2023-03-05 DIAGNOSIS — J449 Chronic obstructive pulmonary disease, unspecified: Secondary | ICD-10-CM | POA: Diagnosis not present

## 2023-03-05 DIAGNOSIS — E1122 Type 2 diabetes mellitus with diabetic chronic kidney disease: Secondary | ICD-10-CM | POA: Diagnosis not present

## 2023-03-05 DIAGNOSIS — N1832 Chronic kidney disease, stage 3b: Secondary | ICD-10-CM | POA: Diagnosis not present

## 2023-03-05 DIAGNOSIS — I5032 Chronic diastolic (congestive) heart failure: Secondary | ICD-10-CM | POA: Diagnosis not present

## 2023-03-05 DIAGNOSIS — I480 Paroxysmal atrial fibrillation: Secondary | ICD-10-CM | POA: Diagnosis not present

## 2023-03-08 DIAGNOSIS — E1122 Type 2 diabetes mellitus with diabetic chronic kidney disease: Secondary | ICD-10-CM | POA: Diagnosis not present

## 2023-03-08 DIAGNOSIS — J449 Chronic obstructive pulmonary disease, unspecified: Secondary | ICD-10-CM | POA: Diagnosis not present

## 2023-03-08 DIAGNOSIS — E1151 Type 2 diabetes mellitus with diabetic peripheral angiopathy without gangrene: Secondary | ICD-10-CM | POA: Diagnosis not present

## 2023-03-08 DIAGNOSIS — J9601 Acute respiratory failure with hypoxia: Secondary | ICD-10-CM | POA: Diagnosis not present

## 2023-03-08 DIAGNOSIS — I480 Paroxysmal atrial fibrillation: Secondary | ICD-10-CM | POA: Diagnosis not present

## 2023-03-08 DIAGNOSIS — N1832 Chronic kidney disease, stage 3b: Secondary | ICD-10-CM | POA: Diagnosis not present

## 2023-03-08 DIAGNOSIS — I5032 Chronic diastolic (congestive) heart failure: Secondary | ICD-10-CM | POA: Diagnosis not present

## 2023-03-08 DIAGNOSIS — I130 Hypertensive heart and chronic kidney disease with heart failure and stage 1 through stage 4 chronic kidney disease, or unspecified chronic kidney disease: Secondary | ICD-10-CM | POA: Diagnosis not present

## 2023-03-10 DIAGNOSIS — N1832 Chronic kidney disease, stage 3b: Secondary | ICD-10-CM | POA: Diagnosis not present

## 2023-03-10 DIAGNOSIS — E1151 Type 2 diabetes mellitus with diabetic peripheral angiopathy without gangrene: Secondary | ICD-10-CM | POA: Diagnosis not present

## 2023-03-10 DIAGNOSIS — I5032 Chronic diastolic (congestive) heart failure: Secondary | ICD-10-CM | POA: Diagnosis not present

## 2023-03-10 DIAGNOSIS — I130 Hypertensive heart and chronic kidney disease with heart failure and stage 1 through stage 4 chronic kidney disease, or unspecified chronic kidney disease: Secondary | ICD-10-CM | POA: Diagnosis not present

## 2023-03-10 DIAGNOSIS — J449 Chronic obstructive pulmonary disease, unspecified: Secondary | ICD-10-CM | POA: Diagnosis not present

## 2023-03-10 DIAGNOSIS — J9601 Acute respiratory failure with hypoxia: Secondary | ICD-10-CM | POA: Diagnosis not present

## 2023-03-10 DIAGNOSIS — E1122 Type 2 diabetes mellitus with diabetic chronic kidney disease: Secondary | ICD-10-CM | POA: Diagnosis not present

## 2023-03-10 DIAGNOSIS — I480 Paroxysmal atrial fibrillation: Secondary | ICD-10-CM | POA: Diagnosis not present

## 2023-03-12 DIAGNOSIS — J9601 Acute respiratory failure with hypoxia: Secondary | ICD-10-CM | POA: Diagnosis not present

## 2023-03-12 DIAGNOSIS — I5032 Chronic diastolic (congestive) heart failure: Secondary | ICD-10-CM | POA: Diagnosis not present

## 2023-03-12 DIAGNOSIS — N1832 Chronic kidney disease, stage 3b: Secondary | ICD-10-CM | POA: Diagnosis not present

## 2023-03-12 DIAGNOSIS — J449 Chronic obstructive pulmonary disease, unspecified: Secondary | ICD-10-CM | POA: Diagnosis not present

## 2023-03-12 DIAGNOSIS — I130 Hypertensive heart and chronic kidney disease with heart failure and stage 1 through stage 4 chronic kidney disease, or unspecified chronic kidney disease: Secondary | ICD-10-CM | POA: Diagnosis not present

## 2023-03-12 DIAGNOSIS — I480 Paroxysmal atrial fibrillation: Secondary | ICD-10-CM | POA: Diagnosis not present

## 2023-03-12 DIAGNOSIS — E1151 Type 2 diabetes mellitus with diabetic peripheral angiopathy without gangrene: Secondary | ICD-10-CM | POA: Diagnosis not present

## 2023-03-12 DIAGNOSIS — E1122 Type 2 diabetes mellitus with diabetic chronic kidney disease: Secondary | ICD-10-CM | POA: Diagnosis not present

## 2023-03-15 DIAGNOSIS — J449 Chronic obstructive pulmonary disease, unspecified: Secondary | ICD-10-CM | POA: Diagnosis not present

## 2023-03-15 DIAGNOSIS — J9601 Acute respiratory failure with hypoxia: Secondary | ICD-10-CM | POA: Diagnosis not present

## 2023-03-15 DIAGNOSIS — N1832 Chronic kidney disease, stage 3b: Secondary | ICD-10-CM | POA: Diagnosis not present

## 2023-03-15 DIAGNOSIS — I5032 Chronic diastolic (congestive) heart failure: Secondary | ICD-10-CM | POA: Diagnosis not present

## 2023-03-15 DIAGNOSIS — E1122 Type 2 diabetes mellitus with diabetic chronic kidney disease: Secondary | ICD-10-CM | POA: Diagnosis not present

## 2023-03-15 DIAGNOSIS — I480 Paroxysmal atrial fibrillation: Secondary | ICD-10-CM | POA: Diagnosis not present

## 2023-03-15 DIAGNOSIS — E1151 Type 2 diabetes mellitus with diabetic peripheral angiopathy without gangrene: Secondary | ICD-10-CM | POA: Diagnosis not present

## 2023-03-15 DIAGNOSIS — I130 Hypertensive heart and chronic kidney disease with heart failure and stage 1 through stage 4 chronic kidney disease, or unspecified chronic kidney disease: Secondary | ICD-10-CM | POA: Diagnosis not present

## 2023-03-16 DIAGNOSIS — J449 Chronic obstructive pulmonary disease, unspecified: Secondary | ICD-10-CM | POA: Diagnosis not present

## 2023-03-16 DIAGNOSIS — I5032 Chronic diastolic (congestive) heart failure: Secondary | ICD-10-CM | POA: Diagnosis not present

## 2023-03-16 DIAGNOSIS — J9601 Acute respiratory failure with hypoxia: Secondary | ICD-10-CM | POA: Diagnosis not present

## 2023-03-16 DIAGNOSIS — I480 Paroxysmal atrial fibrillation: Secondary | ICD-10-CM | POA: Diagnosis not present

## 2023-03-16 DIAGNOSIS — E1122 Type 2 diabetes mellitus with diabetic chronic kidney disease: Secondary | ICD-10-CM | POA: Diagnosis not present

## 2023-03-16 DIAGNOSIS — N1832 Chronic kidney disease, stage 3b: Secondary | ICD-10-CM | POA: Diagnosis not present

## 2023-03-16 DIAGNOSIS — E1151 Type 2 diabetes mellitus with diabetic peripheral angiopathy without gangrene: Secondary | ICD-10-CM | POA: Diagnosis not present

## 2023-03-16 DIAGNOSIS — I130 Hypertensive heart and chronic kidney disease with heart failure and stage 1 through stage 4 chronic kidney disease, or unspecified chronic kidney disease: Secondary | ICD-10-CM | POA: Diagnosis not present

## 2023-03-16 DIAGNOSIS — R1310 Dysphagia, unspecified: Secondary | ICD-10-CM | POA: Diagnosis not present

## 2023-03-18 DIAGNOSIS — E1122 Type 2 diabetes mellitus with diabetic chronic kidney disease: Secondary | ICD-10-CM | POA: Diagnosis not present

## 2023-03-18 DIAGNOSIS — I480 Paroxysmal atrial fibrillation: Secondary | ICD-10-CM | POA: Diagnosis not present

## 2023-03-18 DIAGNOSIS — N1832 Chronic kidney disease, stage 3b: Secondary | ICD-10-CM | POA: Diagnosis not present

## 2023-03-18 DIAGNOSIS — J9601 Acute respiratory failure with hypoxia: Secondary | ICD-10-CM | POA: Diagnosis not present

## 2023-03-18 DIAGNOSIS — I5032 Chronic diastolic (congestive) heart failure: Secondary | ICD-10-CM | POA: Diagnosis not present

## 2023-03-18 DIAGNOSIS — I130 Hypertensive heart and chronic kidney disease with heart failure and stage 1 through stage 4 chronic kidney disease, or unspecified chronic kidney disease: Secondary | ICD-10-CM | POA: Diagnosis not present

## 2023-03-18 DIAGNOSIS — J449 Chronic obstructive pulmonary disease, unspecified: Secondary | ICD-10-CM | POA: Diagnosis not present

## 2023-03-18 DIAGNOSIS — E1151 Type 2 diabetes mellitus with diabetic peripheral angiopathy without gangrene: Secondary | ICD-10-CM | POA: Diagnosis not present

## 2023-03-19 DIAGNOSIS — J8410 Pulmonary fibrosis, unspecified: Secondary | ICD-10-CM | POA: Diagnosis not present

## 2023-03-19 DIAGNOSIS — R1310 Dysphagia, unspecified: Secondary | ICD-10-CM | POA: Diagnosis not present

## 2023-03-19 DIAGNOSIS — J439 Emphysema, unspecified: Secondary | ICD-10-CM | POA: Diagnosis not present

## 2023-03-19 DIAGNOSIS — J180 Bronchopneumonia, unspecified organism: Secondary | ICD-10-CM | POA: Diagnosis not present

## 2023-03-22 ENCOUNTER — Emergency Department (HOSPITAL_COMMUNITY): Payer: Medicare HMO

## 2023-03-22 ENCOUNTER — Inpatient Hospital Stay (HOSPITAL_COMMUNITY)
Admission: EM | Admit: 2023-03-22 | Discharge: 2023-04-02 | DRG: 391 | Disposition: A | Discharge status: Skilled Nursing Facility | Payer: Medicare HMO | Source: Skilled Nursing Facility | Attending: Internal Medicine | Admitting: Internal Medicine

## 2023-03-22 ENCOUNTER — Encounter (HOSPITAL_COMMUNITY): Payer: Self-pay | Admitting: Internal Medicine

## 2023-03-22 ENCOUNTER — Other Ambulatory Visit: Payer: Self-pay

## 2023-03-22 DIAGNOSIS — I5032 Chronic diastolic (congestive) heart failure: Secondary | ICD-10-CM | POA: Diagnosis present

## 2023-03-22 DIAGNOSIS — R399 Unspecified symptoms and signs involving the genitourinary system: Secondary | ICD-10-CM | POA: Diagnosis not present

## 2023-03-22 DIAGNOSIS — D631 Anemia in chronic kidney disease: Secondary | ICD-10-CM | POA: Diagnosis present

## 2023-03-22 DIAGNOSIS — Z86711 Personal history of pulmonary embolism: Secondary | ICD-10-CM

## 2023-03-22 DIAGNOSIS — I130 Hypertensive heart and chronic kidney disease with heart failure and stage 1 through stage 4 chronic kidney disease, or unspecified chronic kidney disease: Secondary | ICD-10-CM | POA: Diagnosis present

## 2023-03-22 DIAGNOSIS — Z7901 Long term (current) use of anticoagulants: Secondary | ICD-10-CM | POA: Diagnosis not present

## 2023-03-22 DIAGNOSIS — I5030 Unspecified diastolic (congestive) heart failure: Secondary | ICD-10-CM | POA: Diagnosis not present

## 2023-03-22 DIAGNOSIS — M545 Low back pain, unspecified: Secondary | ICD-10-CM | POA: Diagnosis not present

## 2023-03-22 DIAGNOSIS — D638 Anemia in other chronic diseases classified elsewhere: Secondary | ICD-10-CM

## 2023-03-22 DIAGNOSIS — R1310 Dysphagia, unspecified: Secondary | ICD-10-CM

## 2023-03-22 DIAGNOSIS — Z7189 Other specified counseling: Secondary | ICD-10-CM | POA: Diagnosis not present

## 2023-03-22 DIAGNOSIS — Z66 Do not resuscitate: Secondary | ICD-10-CM | POA: Diagnosis present

## 2023-03-22 DIAGNOSIS — L89151 Pressure ulcer of sacral region, stage 1: Secondary | ICD-10-CM | POA: Diagnosis not present

## 2023-03-22 DIAGNOSIS — J690 Pneumonitis due to inhalation of food and vomit: Secondary | ICD-10-CM | POA: Diagnosis not present

## 2023-03-22 DIAGNOSIS — R531 Weakness: Principal | ICD-10-CM

## 2023-03-22 DIAGNOSIS — R17 Unspecified jaundice: Secondary | ICD-10-CM | POA: Diagnosis present

## 2023-03-22 DIAGNOSIS — K449 Diaphragmatic hernia without obstruction or gangrene: Secondary | ICD-10-CM | POA: Diagnosis not present

## 2023-03-22 DIAGNOSIS — R1312 Dysphagia, oropharyngeal phase: Secondary | ICD-10-CM | POA: Diagnosis present

## 2023-03-22 DIAGNOSIS — I509 Heart failure, unspecified: Secondary | ICD-10-CM

## 2023-03-22 DIAGNOSIS — Z87891 Personal history of nicotine dependence: Secondary | ICD-10-CM

## 2023-03-22 DIAGNOSIS — C649 Malignant neoplasm of unspecified kidney, except renal pelvis: Secondary | ICD-10-CM | POA: Diagnosis not present

## 2023-03-22 DIAGNOSIS — N1832 Chronic kidney disease, stage 3b: Secondary | ICD-10-CM | POA: Diagnosis not present

## 2023-03-22 DIAGNOSIS — K222 Esophageal obstruction: Principal | ICD-10-CM | POA: Diagnosis present

## 2023-03-22 DIAGNOSIS — R410 Disorientation, unspecified: Secondary | ICD-10-CM | POA: Insufficient documentation

## 2023-03-22 DIAGNOSIS — Z960 Presence of urogenital implants: Secondary | ICD-10-CM | POA: Diagnosis not present

## 2023-03-22 DIAGNOSIS — K5730 Diverticulosis of large intestine without perforation or abscess without bleeding: Secondary | ICD-10-CM | POA: Diagnosis not present

## 2023-03-22 DIAGNOSIS — I482 Chronic atrial fibrillation, unspecified: Secondary | ICD-10-CM | POA: Diagnosis not present

## 2023-03-22 DIAGNOSIS — Z90710 Acquired absence of both cervix and uterus: Secondary | ICD-10-CM

## 2023-03-22 DIAGNOSIS — E861 Hypovolemia: Secondary | ICD-10-CM | POA: Diagnosis present

## 2023-03-22 DIAGNOSIS — J449 Chronic obstructive pulmonary disease, unspecified: Secondary | ICD-10-CM | POA: Diagnosis not present

## 2023-03-22 DIAGNOSIS — R195 Other fecal abnormalities: Secondary | ICD-10-CM | POA: Diagnosis not present

## 2023-03-22 DIAGNOSIS — E782 Mixed hyperlipidemia: Secondary | ICD-10-CM | POA: Diagnosis present

## 2023-03-22 DIAGNOSIS — Z993 Dependence on wheelchair: Secondary | ICD-10-CM

## 2023-03-22 DIAGNOSIS — G609 Hereditary and idiopathic neuropathy, unspecified: Secondary | ICD-10-CM | POA: Diagnosis not present

## 2023-03-22 DIAGNOSIS — I4891 Unspecified atrial fibrillation: Secondary | ICD-10-CM | POA: Diagnosis present

## 2023-03-22 DIAGNOSIS — D649 Anemia, unspecified: Secondary | ICD-10-CM | POA: Diagnosis not present

## 2023-03-22 DIAGNOSIS — Z9682 Presence of neurostimulator: Secondary | ICD-10-CM | POA: Diagnosis not present

## 2023-03-22 DIAGNOSIS — G894 Chronic pain syndrome: Secondary | ICD-10-CM | POA: Diagnosis present

## 2023-03-22 DIAGNOSIS — R748 Abnormal levels of other serum enzymes: Secondary | ICD-10-CM | POA: Diagnosis not present

## 2023-03-22 DIAGNOSIS — I2510 Atherosclerotic heart disease of native coronary artery without angina pectoris: Secondary | ICD-10-CM | POA: Diagnosis present

## 2023-03-22 DIAGNOSIS — E860 Dehydration: Secondary | ICD-10-CM | POA: Diagnosis present

## 2023-03-22 DIAGNOSIS — Z833 Family history of diabetes mellitus: Secondary | ICD-10-CM

## 2023-03-22 DIAGNOSIS — I700 Atherosclerosis of aorta: Secondary | ICD-10-CM | POA: Diagnosis not present

## 2023-03-22 DIAGNOSIS — R627 Adult failure to thrive: Secondary | ICD-10-CM | POA: Diagnosis present

## 2023-03-22 DIAGNOSIS — K922 Gastrointestinal hemorrhage, unspecified: Secondary | ICD-10-CM

## 2023-03-22 DIAGNOSIS — Z1152 Encounter for screening for COVID-19: Secondary | ICD-10-CM | POA: Diagnosis not present

## 2023-03-22 DIAGNOSIS — E871 Hypo-osmolality and hyponatremia: Secondary | ICD-10-CM | POA: Diagnosis not present

## 2023-03-22 DIAGNOSIS — Z8673 Personal history of transient ischemic attack (TIA), and cerebral infarction without residual deficits: Secondary | ICD-10-CM

## 2023-03-22 DIAGNOSIS — E1151 Type 2 diabetes mellitus with diabetic peripheral angiopathy without gangrene: Secondary | ICD-10-CM | POA: Diagnosis present

## 2023-03-22 DIAGNOSIS — R64 Cachexia: Secondary | ICD-10-CM | POA: Diagnosis present

## 2023-03-22 DIAGNOSIS — R1319 Other dysphagia: Secondary | ICD-10-CM | POA: Insufficient documentation

## 2023-03-22 DIAGNOSIS — Z515 Encounter for palliative care: Secondary | ICD-10-CM | POA: Diagnosis not present

## 2023-03-22 DIAGNOSIS — R0989 Other specified symptoms and signs involving the circulatory and respiratory systems: Secondary | ICD-10-CM | POA: Diagnosis not present

## 2023-03-22 DIAGNOSIS — I959 Hypotension, unspecified: Secondary | ICD-10-CM | POA: Diagnosis present

## 2023-03-22 DIAGNOSIS — F05 Delirium due to known physiological condition: Secondary | ICD-10-CM | POA: Diagnosis not present

## 2023-03-22 DIAGNOSIS — Z79899 Other long term (current) drug therapy: Secondary | ICD-10-CM

## 2023-03-22 DIAGNOSIS — L304 Erythema intertrigo: Secondary | ICD-10-CM | POA: Diagnosis present

## 2023-03-22 DIAGNOSIS — I6782 Cerebral ischemia: Secondary | ICD-10-CM | POA: Diagnosis not present

## 2023-03-22 DIAGNOSIS — Z6826 Body mass index (BMI) 26.0-26.9, adult: Secondary | ICD-10-CM

## 2023-03-22 DIAGNOSIS — I517 Cardiomegaly: Secondary | ICD-10-CM | POA: Diagnosis not present

## 2023-03-22 DIAGNOSIS — R1314 Dysphagia, pharyngoesophageal phase: Secondary | ICD-10-CM | POA: Diagnosis present

## 2023-03-22 DIAGNOSIS — E1122 Type 2 diabetes mellitus with diabetic chronic kidney disease: Secondary | ICD-10-CM | POA: Diagnosis not present

## 2023-03-22 DIAGNOSIS — E440 Moderate protein-calorie malnutrition: Secondary | ICD-10-CM

## 2023-03-22 DIAGNOSIS — J180 Bronchopneumonia, unspecified organism: Secondary | ICD-10-CM | POA: Diagnosis not present

## 2023-03-22 DIAGNOSIS — N1831 Chronic kidney disease, stage 3a: Secondary | ICD-10-CM | POA: Diagnosis present

## 2023-03-22 DIAGNOSIS — I6523 Occlusion and stenosis of bilateral carotid arteries: Secondary | ICD-10-CM | POA: Diagnosis not present

## 2023-03-22 DIAGNOSIS — L8990 Pressure ulcer of unspecified site, unspecified stage: Secondary | ICD-10-CM | POA: Diagnosis present

## 2023-03-22 DIAGNOSIS — Z96653 Presence of artificial knee joint, bilateral: Secondary | ICD-10-CM | POA: Diagnosis present

## 2023-03-22 DIAGNOSIS — R918 Other nonspecific abnormal finding of lung field: Secondary | ICD-10-CM | POA: Diagnosis not present

## 2023-03-22 DIAGNOSIS — K219 Gastro-esophageal reflux disease without esophagitis: Secondary | ICD-10-CM | POA: Diagnosis present

## 2023-03-22 DIAGNOSIS — Z7951 Long term (current) use of inhaled steroids: Secondary | ICD-10-CM

## 2023-03-22 DIAGNOSIS — Z85828 Personal history of other malignant neoplasm of skin: Secondary | ICD-10-CM

## 2023-03-22 DIAGNOSIS — M48062 Spinal stenosis, lumbar region with neurogenic claudication: Secondary | ICD-10-CM | POA: Diagnosis present

## 2023-03-22 DIAGNOSIS — C641 Malignant neoplasm of right kidney, except renal pelvis: Secondary | ICD-10-CM | POA: Diagnosis present

## 2023-03-22 DIAGNOSIS — Z7401 Bed confinement status: Secondary | ICD-10-CM | POA: Diagnosis not present

## 2023-03-22 DIAGNOSIS — K224 Dyskinesia of esophagus: Secondary | ICD-10-CM | POA: Diagnosis not present

## 2023-03-22 HISTORY — DX: Pneumonitis due to inhalation of food and vomit: J69.0

## 2023-03-22 HISTORY — DX: Hypo-osmolality and hyponatremia: E87.1

## 2023-03-22 HISTORY — DX: Unspecified atrial fibrillation: I48.91

## 2023-03-22 HISTORY — DX: Heart failure, unspecified: I50.9

## 2023-03-22 LAB — PROCALCITONIN: Procalcitonin: 0.68 ng/mL

## 2023-03-22 LAB — BASIC METABOLIC PANEL
Anion gap: 11 (ref 5–15)
BUN: 63 mg/dL — ABNORMAL HIGH (ref 8–23)
CO2: 23 mmol/L (ref 22–32)
Calcium: 10.3 mg/dL (ref 8.9–10.3)
Chloride: 91 mmol/L — ABNORMAL LOW (ref 98–111)
Creatinine, Ser: 1.18 mg/dL — ABNORMAL HIGH (ref 0.44–1.00)
GFR, Estimated: 48 mL/min — ABNORMAL LOW (ref >60)
Glucose, Bld: 95 mg/dL (ref 70–99)
Potassium: 4.6 mmol/L (ref 3.5–5.1)
Sodium: 125 mmol/L — ABNORMAL LOW (ref 135–145)

## 2023-03-22 LAB — CBC
HCT: 31.4 % — ABNORMAL LOW (ref 36.0–46.0)
Hemoglobin: 10.4 g/dL — ABNORMAL LOW (ref 12.0–15.0)
MCH: 29.1 pg (ref 26.0–34.0)
MCHC: 33.1 g/dL (ref 30.0–36.0)
MCV: 87.7 fL (ref 80.0–100.0)
Platelets: 333 10*3/uL (ref 150–400)
RBC: 3.58 MIL/uL — ABNORMAL LOW (ref 3.87–5.11)
RDW: 15.3 % (ref 11.5–15.5)
WBC: 12.2 10*3/uL — ABNORMAL HIGH (ref 4.0–10.5)
nRBC: 0 % (ref 0.0–0.2)

## 2023-03-22 LAB — PROTIME-INR
INR: 2 — ABNORMAL HIGH (ref 0.8–1.2)
Prothrombin Time: 23 s — ABNORMAL HIGH (ref 11.4–15.2)

## 2023-03-22 LAB — OSMOLALITY: Osmolality: 282 mosm/kg (ref 275–295)

## 2023-03-22 LAB — TROPONIN I (HIGH SENSITIVITY)
Troponin I (High Sensitivity): 18 ng/L — ABNORMAL HIGH (ref <18)
Troponin I (High Sensitivity): 11 ng/L (ref <18)

## 2023-03-22 LAB — LACTIC ACID, PLASMA: Lactic Acid, Venous: 0.8 mmol/L (ref 0.5–1.9)

## 2023-03-22 LAB — TYPE AND SCREEN
ABO/RH(D): A POS
Antibody Screen: NEGATIVE

## 2023-03-22 LAB — BRAIN NATRIURETIC PEPTIDE: B Natriuretic Peptide: 168.5 pg/mL — ABNORMAL HIGH (ref 0.0–100.0)

## 2023-03-22 LAB — RESP PANEL BY RT-PCR (RSV, FLU A&B, COVID)  RVPGX2
Influenza A by PCR: NEGATIVE
Influenza B by PCR: NEGATIVE
Resp Syncytial Virus by PCR: NEGATIVE
SARS Coronavirus 2 by RT PCR: NEGATIVE

## 2023-03-22 LAB — LIPASE, BLOOD: Lipase: 376 U/L — ABNORMAL HIGH (ref 11–51)

## 2023-03-22 LAB — POC OCCULT BLOOD, ED: Fecal Occult Bld: POSITIVE — AB

## 2023-03-22 MED ORDER — PIPERACILLIN-TAZOBACTAM 3.375 G IVPB 30 MIN
3.3750 g | Freq: Once | INTRAVENOUS | Status: AC
  Administered 2023-03-22: 3.375 g via INTRAVENOUS
  Filled 2023-03-22: qty 50

## 2023-03-22 MED ORDER — NYSTATIN 100000 UNIT/GM EX CREA
1.0000 | TOPICAL_CREAM | Freq: Two times a day (BID) | CUTANEOUS | Status: DC
  Administered 2023-03-22 – 2023-03-27 (×10): 1 via TOPICAL
  Filled 2023-03-22: qty 30

## 2023-03-22 MED ORDER — OXYCODONE HCL 5 MG PO TABS
10.0000 mg | ORAL_TABLET | Freq: Four times a day (QID) | ORAL | Status: DC | PRN
  Administered 2023-03-23 – 2023-03-24 (×4): 10 mg via ORAL
  Filled 2023-03-22 (×4): qty 2

## 2023-03-22 MED ORDER — PRAVASTATIN SODIUM 40 MG PO TABS
40.0000 mg | ORAL_TABLET | Freq: Every day | ORAL | Status: DC
  Administered 2023-03-24 – 2023-03-26 (×3): 40 mg via ORAL
  Filled 2023-03-22 (×4): qty 1

## 2023-03-22 MED ORDER — PIPERACILLIN-TAZOBACTAM 3.375 G IVPB
3.3750 g | Freq: Three times a day (TID) | INTRAVENOUS | Status: DC
  Administered 2023-03-23 (×2): 3.375 g via INTRAVENOUS
  Filled 2023-03-22 (×2): qty 50

## 2023-03-22 MED ORDER — LACTATED RINGERS IV BOLUS
500.0000 mL | Freq: Once | INTRAVENOUS | Status: AC
Start: 2023-03-22 — End: 2023-03-22
  Administered 2023-03-22: 500 mL via INTRAVENOUS

## 2023-03-22 MED ORDER — METHOCARBAMOL 500 MG PO TABS
250.0000 mg | ORAL_TABLET | Freq: Three times a day (TID) | ORAL | Status: DC
  Administered 2023-03-22 – 2023-03-25 (×9): 250 mg via ORAL
  Filled 2023-03-22 (×11): qty 1

## 2023-03-22 MED ORDER — PANTOPRAZOLE SODIUM 40 MG PO TBEC
40.0000 mg | DELAYED_RELEASE_TABLET | Freq: Every day | ORAL | Status: DC
  Administered 2023-03-22 – 2023-03-27 (×6): 40 mg via ORAL
  Filled 2023-03-22 (×6): qty 1

## 2023-03-22 MED ORDER — OXYCODONE HCL 10 MG PO TABS: 10.0000 mg | ORAL_TABLET | Freq: Four times a day (QID) | ORAL | Status: DC | PRN

## 2023-03-22 MED ORDER — LACTATED RINGERS IV BOLUS
500.0000 mL | Freq: Once | INTRAVENOUS | Status: AC
  Administered 2023-03-22: 500 mL via INTRAVENOUS

## 2023-03-22 MED ORDER — OXYCODONE HCL 5 MG PO TABS: 10.0000 mg | ORAL_TABLET | ORAL | Status: DC | PRN

## 2023-03-22 MED ORDER — SODIUM CHLORIDE 0.9% FLUSH: 3.0000 mL | INTRAVENOUS | Status: DC | PRN

## 2023-03-22 NOTE — Progress Notes (Signed)
ED Pharmacy Antibiotic Sign Off An antibiotic consult was received from an ED provider for Zosyn (piperacillin-tazobactam) per pharmacy dosing for aspiration pneumonia. A chart review was completed to assess appropriateness.   The following one time order(s) were placed:  Zosyn 3.375g IV x1 over 30 minutes  Further antibiotic and/or antibiotic pharmacy consults should be ordered by the admitting provider if indicated.   Thank you for allowing pharmacy to be a part of this patient's care.   Wilburn Cornelia, PharmD, BCPS Clinical Pharmacist 03/22/2023 5:15 PM   Please refer to [facility] for pharmacy phone number

## 2023-03-22 NOTE — Hospital Course (Addendum)
#  Aspiration pneumonia, resolved #Hx esophageal stricture s/p dilation #Schatzki ring, s/p dilation Patient presented with generalized weakness and poor oral intake in the setting of known esophageal strictures. She had had worsening of chronic esophageal dysphagia for the past 3 weeks. Presented afebrile and with a leukocytosis to 12.2. CXR at admission showed ovoid right upper lung opacity possibly representing acute pulmonary infiltrate, confirmed by CT chest showing multifocal ground-glass and consolidative infiltrates scattered throughout the lungs bilaterally. Patient's overall clinical picture was concerning for aspiration pneumonia. She was started on Zosyn, then transitioned to and completed a course of Unasyn. GI was consulted who found a non-obstructing Schatzki ring at the gastroesophageal junction; dilation was performed. Patient was able transition to full liquid diet then to dysphagia 3 diet without further concern for aspiration. Patient's chronic malnutrition and failure to thrive make her overall prognosis poor. Palliative care was consulted during admission who completed a MOST form and patient's code status was changed to DNR. Patient was discharged to skilled nursing facility for long-term care.    #Hyponatremia Patient presented with a sodium of 125 in the setting of poor po intake and chronic malnutrition. Sodium was trended with daily labs, which improved as her esophageal strictures were dilated and aspiration pneumonia was treated. Hyponatremia resolved with resumption of normal diet.    #Delirium Patient developed hospital delirium with waxing and waning mental status throughout admission. Delirium precautions were put in place and centrally acting medications were held.    #Atrial fibrillation Patient was in atrial fibrillation on presentation with tachycardia into the 110s-120s, possibly worsened in the setting of acute illness and dehydration. Home Eliquis 5mg  BID continued  throughout admission.    #COPD Patient's home Breo Ellipta and albuterol were continued during admission.    #HFpEF (EF 55-60%) Home spironolactone was held, home Lasix continued. Patient was euvolemic and not needing accessory oxygen on day of discharge.    #Right kidney mass, suspected primary RCC CT Abdomen showed a lobulated enhancing mass seen within the right kidney measuring at least 2.1 x 2.8x 4.1 cm compatible with a primary renal cell carcinoma with no evidence of regional nodal or distant metastatic disease. Patient was seen by oncology who recommended further evaluation by urology. Further workup deferred to outpatient setting given acute illness.    #T2DM Patient's home Rybelsus was held during admission.    #CAD Home statin was continued during admission   #GERD Home PPI continued during admission

## 2023-03-22 NOTE — ED Triage Notes (Addendum)
Patient BIB [facility] from home for weakness x 2 weeks and unable to tolerate food, only liquids x 2 weeks as well. Patient follows with GI, hx of stricture. Initially BP 86/58, Improved to 96/62 with NS, limited fluid resus d/t CHF and pulmonary edema. 20g RAC

## 2023-03-22 NOTE — H&P (Addendum)
Date: 03/22/2023               Patient Name:  Lisa Maxwell MRN: [id number]  DOB: June 15, 1945 Age / Sex: 77 y.o., female   PCP: Paulina Fusi, MD         Medical Service: Internal Medicine Teaching Service         Attending Physician: Dr. Reymundo Poll, MD      First Contact: Dr. Annett Fabian, MD Pager [phone]    Second Contact: Dr. Marrianne Mood, MD Pager [phone]         After Hours (After 5p/  First Contact Pager: [phone]  weekends / holidays): Second Contact Pager: [phone]   SUBJECTIVE   Chief Complaint: generalized weakness, poor oral intake  History of Present Illness: Lisa Maxwell is a 77 year old female with a past medical history of esophageal strictures s/p dilation procedures, prior saddle embolus (11/2021), atrial fibrillation on Eliquis, HFpEF, type 2 diabetes, CAD, and chronic low back pain s/p spinal cord stimulation insertion who presents with concerns for generalized weakness and poor oral intake.   Her husband is bedside and provided most of the history. Patient has had difficulty swallowing for 2-3 weeks and feels like food gets stuck in her throat in a similar location to where it previously got stuck with her prior esophageal stricture.  She also reports some nausea and vomiting for the same time period. The nausea and vomiting usually occur right after she eats and contain her eaten food. She has been unable to keep food down at all but is able to keep down liquids most of the time, however she notes that sometimes she coughs after drinking. It has been over 3 weeks since she has eaten a full meal. Per her husband, she went to her primary care doctor for these symptoms and he ordered a swallow study and barium swallow, which were completed at [facility] last week.  The husband says the swallow test was concerning for possible esophageal narrowing, however the barium swallow was negative.  She has also had a recent cough with clear sputum  production, which is new over the past few weeks.  She denies any fevers, chills, sick contacts.  She does report some shortness of breath, but states this is chronic in the setting of her COPD and does not feel like it is significantly different than baseline.  She has been using her inhalers regularly. She also reports daily headaches for the past week. She has a history of migraines but states she has not had any in years.   The main symptom that precipitated her coming to the hospital was general increased generalized weakness. At baseline, she previously could get out of bed to her wheel chair and was able to transfer on her own, including using the bathrooms. On Saturday, she was unable to do this, which concerned her husband. This improved on Sunday, but today she was again unable to get up or transfer on her own, so her husband brought her to the hospital.   ED Course: Patient was hypotensive with a systolic blood pressure in the 80s and tachycardia in the 110s-120s.  Afebrile.  Hemoccult positive stool. Hgb 10.4. Chest x-ray showed cardiac enlargement, congestion RUL opacity. Hyponatremic with sodium of 125. Troponin 18. UA, blood cultures pending. Mild leukocytosis of 12.2. BNP 168.5. Started on Zosyn, LR bolus 500cc. Also received 500cc bolus by EMS.   Meds:  Current Meds  Medication Sig   apixaban (ELIQUIS) 5 MG  TABS tablet TAKE 1 TABLET BY MOUTH TWICE A DAY   bethanechol (URECHOLINE) 50 MG tablet Take 50 mg by mouth 3 (three) times daily.   Coenzyme Q10 (CO Q-10) 200 MG CAPS Take 200 mg by mouth in the morning.   Cyanocobalamin (VITAMIN B-12) 500 MCG SUBL Place 500 mcg under the tongue in the morning.   docusate sodium (COLACE) 100 MG capsule Take 100 mg by mouth 2 (two) times daily as needed for mild constipation or moderate constipation.   fluticasone furoate-vilanterol (BREO ELLIPTA) 100-25 MCG/ACT AEPB Inhale 1-2 puffs into the lungs daily.   furosemide (LASIX) 40 MG tablet Take 1  tablet (40 mg total) by mouth 2 (two) times daily.   gabapentin (NEURONTIN) 600 MG tablet Take 1 tablet (600 mg total) by mouth at bedtime.   Iron, Ferrous Sulfate, 325 (65 Fe) MG TABS Take 65 mg of iron by mouth daily with breakfast.   KLOR-CON M20 20 MEQ tablet Take 20 mEq by mouth at bedtime.   latanoprost (XALATAN) 0.005 % ophthalmic solution Place 1 drop into both eyes at bedtime.   lovastatin (MEVACOR) 40 MG tablet Take 40 mg by mouth at bedtime.    methocarbamol (ROBAXIN) 500 MG tablet Take 0.5 tablets (250 mg total) by mouth 3 (three) times daily. (Patient taking differently: Take 250 mg by mouth 4 (four) times daily.)   Multiple Vitamins-Minerals (CENTRUM SILVER 50+WOMEN) TABS Take 1 tablet by mouth daily with breakfast.   nystatin cream (MYCOSTATIN) Apply 1 Application topically 2 (two) times daily.   Oxycodone HCl 10 MG TABS Take 10 mg by mouth 4 (four) times daily as needed.   pantoprazole (PROTONIX) 40 MG tablet Take 1 tablet (40 mg total) by mouth daily. (Patient taking differently: Take 40 mg by mouth in the morning.)   RYBELSUS 3 MG TABS Take 3 mg by mouth daily before breakfast.   spironolactone (ALDACTONE) 25 MG tablet Take 25 mg by mouth every morning.   tamsulosin (FLOMAX) 0.4 MG CAPS capsule Take 0.4 mg by mouth 2 (two) times daily.   triamcinolone ointment (KENALOG) 0.5 % Apply 1 Application topically at bedtime.   Turmeric (QC TUMERIC COMPLEX PO) Take 1 tablet by mouth at bedtime.   Past Medical History: Atrial fibrillation on eliquis Type 2 diabetes HFpEF Esophageal stricture s/p dilation Chronic back pain s/p spinal cord stimulator  Past Surgical History:  Procedure Laterality Date   ABDOMINAL HYSTERECTOMY     BACK SURGERY     BIOPSY  06/18/2022   Procedure: BIOPSY;  Surgeon: Lemar Lofty., MD;  Location: [facility] ENDOSCOPY;  Service: Gastroenterology;;   ESOPHAGOGASTRODUODENOSCOPY (EGD) WITH PROPOFOL N/A 06/18/2022   Procedure: ESOPHAGOGASTRODUODENOSCOPY  (EGD) WITH PROPOFOL;  Surgeon: Lemar Lofty., MD;  Location: [facility] ENDOSCOPY;  Service: Gastroenterology;  Laterality: N/A;   EYE SURGERY     bilateral cataracts   IR ANGIOGRAM PULMONARY BILATERAL SELECTIVE  11/21/2021   IR ANGIOGRAM SELECTIVE EACH ADDITIONAL VESSEL  11/21/2021   IR ANGIOGRAM SELECTIVE EACH ADDITIONAL VESSEL  11/21/2021   IR THROMBECT PRIM MECH INIT (INCLU) MOD SED  11/21/2021   IR US GUIDE VASC ACCESS RIGHT  11/21/2021   JOINT REPLACEMENT Bilateral    knees   RADIOLOGY WITH ANESTHESIA N/A 11/21/2021   Procedure: IR WITH ANESTHESIA;  Surgeon: Radiologist, Medication, MD;  Location: [facility] OR;  Service: Radiology;  Laterality: N/A;   SAVORY DILATION N/A 06/18/2022   Procedure: SAVORY DILATION;  Surgeon: Meridee Score Netty Starring., MD;  Location: [facility] ENDOSCOPY;  Service: Gastroenterology;  Laterality: N/A;   SPINAL CORD STIMULATOR INSERTION N/A 07/07/2019   Procedure: LUMBAR SPINAL CORD STIMULATOR INSERTION;  Surgeon: Odette Fraction, MD;  Location: [name] OR;  Service: Neurosurgery;  Laterality: N/A;   total left knee     Social:  Lives With: Lives with husband in [place]. No pets.  Occupation: Worked in Designer, fashion/clothing, then [employer].  Support: Good support from husband and kids.  Level of Function: Husband helps her with all ADLs and iADLs.  PCP: Paulina Fusi, MD Substances: Only occasionally at a restaurant. Uses 1/2 pack per day, smoked since she was about 77 years old. Denies any other drug use.  Family History: Family History  Problem Relation Age of Onset   Diabetes Mother     Allergies: Allergies as of 03/22/2023   (No Known Allergies)   Review of Systems: A complete ROS was negative except as per HPI.   OBJECTIVE:   Physical Exam: Blood pressure (!) 96/54, pulse 90, temperature (!) 96.7 F (35.9 C), temperature source Rectal, resp. rate (!) 23, SpO2 96%.  Constitutional: ill-appearing elderly woman, dry, fatigued; catheter in place HENT: normocephalic  atraumatic, mucous membranes dry; soft palate and visible airway grossly normal Neck: supple Cardiovascular: irregular rhythm, tachycardic; no murmurs appreciated; pulses strong bilaterally Pulmonary/Chest: significant rales and rhonchi, particularly in the lung bases Abdominal: soft, non-tender, non-distended MSK: cachectic, generalized atrophy of muscles Neurological: alert & oriented x 3, generalized weakness, unable to sit up or turn on side without assistance Skin: diffusely dry; fungal rash present under breasts, in inguinal regions, and near her umbilicus; bruising noted on her hands; a plaque-like hypopigmented lesion on her left knee  Labs: CBC    Component Value Date/Time   WBC 12.2 (H) 03/22/2023 1513   RBC 3.58 (L) 03/22/2023 1513   HGB 10.4 (L) 03/22/2023 1513   HCT 31.4 (L) 03/22/2023 1513   PLT 333 03/22/2023 1513   MCV 87.7 03/22/2023 1513   MCH 29.1 03/22/2023 1513   MCHC 33.1 03/22/2023 1513   RDW 15.3 03/22/2023 1513   LYMPHSABS 1.3 12/01/2022 0339   MONOABS 0.6 12/01/2022 0339   EOSABS 0.1 12/01/2022 0339   BASOSABS 0.0 12/01/2022 0339     CMP     Component Value Date/Time   NA 125 (L) 03/22/2023 1513   K 4.6 03/22/2023 1513   CL 91 (L) 03/22/2023 1513   CO2 23 03/22/2023 1513   GLUCOSE 95 03/22/2023 1513   BUN 63 (H) 03/22/2023 1513   CREATININE 1.18 (H) 03/22/2023 1513   CALCIUM 10.3 03/22/2023 1513   PROT 5.8 (L) 12/04/2022 0543   ALBUMIN 2.8 (L) 12/04/2022 0543   AST 41 12/04/2022 0543   ALT 20 12/04/2022 0543   ALKPHOS 66 12/04/2022 0543   BILITOT 1.0 12/04/2022 0543   GFRNONAA 48 (L) 03/22/2023 1513   GFRAA 35 (L) 07/04/2019 1150   BNP: 168.5 Troponin: 18 --> 11 Lactic: 0.8 Blood cultures pending UA pending  Imaging: DG Chest Port 1 View CLINICAL DATA:  Weakness  EXAM: PORTABLE CHEST 1 VIEW  COMPARISON:  12/05/2022  FINDINGS: Ascending thoracic stimulator leads with similar positioning. Cardiac enlargement with mild central  congestion. No pleural effusion. No pneumothorax. Ovoid right upper lung opacity.  IMPRESSION: 1. Cardiac enlargement with mild central congestion. 2. Ovoid right upper lung opacity, this could represent acute pulmonary infiltrate with lung nodule not excluded. Short interval radiographic follow-up is recommended.  Electronically Signed   By: Jasmine Pang M.D.   On: 03/22/2023 16:26  EKG: personally reviewed my interpretation is atrial fibrillation, slightly increased Qtc 469.  ASSESSMENT & PLAN:   Assessment & Plan by Problem: Principal Problem:   Aspiration pneumonia (HCC) Active Problems:   COPD (chronic obstructive pulmonary disease) (HCC)   Decubitus skin ulcer   Dysphagia   Hyponatremia   Chronic CHF (congestive heart failure) (HCC)   Atrial fibrillation with RVR (HCC)   GI bleeding  Lisa Maxwell is a 77 y.o. person living with a history of esophageal strictures s/p dilation procedures, prior saddle embolus (11/2021), atrial fibrillation on Eliquis, HFpEF, type 2 diabetes, CAD, and chronic low back pain s/p spinal cord stimulation insertion who presents with concerns for generalized weakness and poor oral intake and is admitted for aspiration pneumonia on hospital day 0  Aspiration pneumonia Dysphagia History of esophageal strictures s/p dilation Given her new onset cough, the sensation of food getting stuck in her throat, and vomiting after eating, I am concerned for aspiration pneumonia.  Her lungs sound junky bilaterally, especially in the bases.  There was some congestion and opacities on her chest x-ray and she has a leukocytosis. She was started on Zosyn in the emergency department, which we will continue. We will re-evaluate the dysphagia in the morning.   - continue Zosyn - repeat swallow eval, or get access to study results from [place] in the AM - consider GI consult in AM for strictures pending swallow eval - follow up blood cultures, CBC tomorrow AM -  Clear liquid diet  Hyponatremia Sodium 125 on presentation to the emergency department, perhaps due to poor solute intake. - 500cc bolus LR - check serum osmolality, urine osmolality, urine sodium - Recheck BMP at midnight  Atrial fibrillation, on anticoagulation HFpEF (EF 55-60% in 11/2022) She was in atrial fibrillation on presentation with tachycardia into the 110s-120s, possibly worsened in the setting of acute illness and dehydration. She takes Eliquis 5 mg BID. For her heart failure, she is taking spironolactone 25 mg daily and Lasix 40 mg BID. - Holding eliquis; will restart if Hgb stable tomorrow AM - Holding spironolactone, lasix given hypotension  Dehydration in the setting of poor solute intake Hypotension She appears significantly dehydrated on exam.  Sodium was 125 on admission.  Since she has not eaten in several weeks, I suspect some of her hypotension and tachycardia may be related to overall hypovolemia.  We will give her IV fluids slowly in the setting of her heart failure with preserved ejection fraction. - 500 mL bolus LR - Clear liquid diet - Monitor BP closely  GI bleed History of posterior anal fissure and internal hemorrhoids Hemoglobin 10.4 on admission, down from baseline around 12-14. Hemoccult positive. Denies any recent blood in stool. She does note a history of hemorrhoids. Per chart review, she had a healed posterior anal fissure noted on a colonoscopy earlier this year. - recheck CBC tomorrow AM; if downtrending, consult GI  Chronic conditions: COPD: on room air at home; uses breo ellipta and albuterol inhalers at home, held.  Chronic back pain s/p spinal cord stimulator: taking oxycodone 10 mg QID as needed, robaxin 500 mg TID, reordered. Takes gabapentin 600 mg at bedtime, held.  Type 2 diabetes mellitus: on Rybelsus 3 mg daily, held. A1c 6.1 nine months ago. CAD/HLD: on lovastatin 40 mg daily, reordered. Takes nitro 0.4 mg prn at home, held. GERD:  takes protonix 40 mg daily at home, reordered.  Intertrigo: has fungal rash under breasts and inguinal areas, uses nystatin cream at home,  reordered.  Diet: Clears VTE: Enoxaparin IVF: LR, 500cc NS Code: Full  Prior to Admission Living Arrangement: Home, living with husband Anticipated Discharge Location: pending workup Barriers to Discharge: medical workup and treatment  Dispo: Admit patient to Inpatient with expected length of stay greater than 2 midnights.  Signed: Annett Fabian, MD Internal Medicine Resident, PGY-1 [facility]  Pager: [phone]  03/22/2023, 7:22 PM

## 2023-03-22 NOTE — ED Notes (Signed)
Dark stool noted.  Hemoccult collected.

## 2023-03-22 NOTE — ED Provider Notes (Signed)
[place] [facility]ERGENCY DEPARTMENT AT [facility] Provider Note  MDM   HPI/ROS:  Lisa Maxwell is a 77 y.o. female with a medical history.  Obesity, A-fib on Eliquis, diabetes, chronic low back pain, CAD, HFpEF esophageal strictures with dilation in the past, prior saddle embolus 11/2021, here today with concern for generalized weakness.  Patient is alert oriented x 3, however per husband provides most the history.  He states that she has had progressive generalized weakness over the past 2 weeks, which became most prominent over the past couple of days.  He believes this is due to poor intake, history of esophageal strictures with dilation, he states that she has not been tolerating food and even some liquid more recently.  She is nonambulatory at baseline, due to low back spinal cord issues, and has a chronic indwelling Foley last changed 2 weeks ago.  She is nonambulatory at baseline, uses a wheelchair, and lifts inside the house, husband takes good care of her.  However today, she became very very weak, was not able to move at all, very poor effort, so he got concerned and brought her in for evaluation.  ROS is negative except per HPI.  On arrival, pt is afebrile but hypotensive in the 80s systolic tachycardia to 110s 120s  This patient's current presentation, including their history and physical exam, is most consistent with GI bleed, sepsis,.  Dehydration, failure to thrive, esophageal stricture (dilated on 3/24).   For this patient, we are going to a broad workup, concern for GI bleed although she is taking iron pills.  Dark stools.  We will obtain a BNP looking for heart exacerbation as well as checks x-ray looking for pneumonia, fluid overload, lipase looking for pancreatitis initial lactic screening for sepsis, UA with fresh Foley, COVID and flu, CBC BMP, and also get 1 single troponin.  ECG initially done upfront, shows atrial fibrillation no major signs of ischemia.  Anticipate this  patient will require hospital admission due to the abnormal vital signs on arrival, significant decompensation in status, generalized weakness, and inability of husband to continue providing care for her given the severe weakness that is happening right now.  Interpretations, interventions, and the patient's course of care are documented below.      This patient's workup has come back, hyponatremic to 125 chloride 91, creatinine slightly high at 1.18, lipase very mildly elevated at 376.  Initial troponin 18.  BNP slightly elevated 168 COVID and flu is negative.  There is a concern for opacity in the right upper lung, for this patient, with the hypotension and the tachycardia, I am broadening the workup to exclude sepsis, we will still waiting on a urine as well, but will obtain blood cultures and start Zosyn in the meantime given the significant concern for pneumonia, the patient has been vomiting for the past couple weeks, inability to tolerate p.o. intake, hyponatremia, she will need admission to the hospital at this time.  The medicine team will follow-up the UA, second troponin, blood cultures, lactic acid.   Clinical Impression:  1. Weakness   2. Hyponatremia   3. Anemia, chronic disease     Rx / DC Orders ED Discharge Orders     None       The plan for this patient was discussed with Dr. Jodi Mourning, who voiced agreement and who oversaw evaluation and treatment of this patient.   Medical Decision Making Amount and/or Complexity of Data Reviewed Labs: ordered.  Risk Prescription drug management.  Decision regarding hospitalization.    HPI/ROS      See MDM section for pertinent HPI and ROS. A complete ROS was performed with pertinent positives/negatives noted above.   Past Medical History:  Diagnosis Date   CAD (coronary artery disease) 11/04/2016   Cancer (HCC)    skin cancer   Cauda equina compression (HCC) 08/28/2021   Cerebrovascular disease    Chronic atrial  fibrillation (HCC) 09/28/2018   Chronic bilateral low back pain without sciatica 03/08/2018   Chronic kidney disease    Chronic obstructive lung disease (HCC) 09/28/2018   Chronic pain syndrome 03/08/2018   Chronic renal failure 09/28/2018   Chronic, continuous use of opioids 03/04/2021   Last Assessment & Plan:  Formatting of this note might be different from the original. Patient and I have discussed the hazardous effects of continued opiate pain medication usage. Risks and benefits of above medications including but not limited to possibility of respiratory depression, sedation, and even death were discussed with the patient who expressed an understanding.  Patient did not displ   CKD (chronic kidney disease) stage 3, GFR 30-59 ml/min (HCC)    Class 1 obesity due to excess calories with serious comorbidity and body mass index (BMI) of 34.0 to 34.9 in adult 05/20/2021   Last Assessment & Plan: Formatting of this note might be different from the original. Patient educated about the detrimental effects of weight as it specifically pertains to pain management and overall health.  Patient's BMI 34.08 Encouraged healthy eating habits and routine low-impact cardiovascular exercises as tolerated.   Comprehensive diabetic foot examination, type 2 DM, encounter for (HCC) 04/29/2021   DDD (degenerative disc disease), lumbar 11/17/2018   Last Assessment & Plan:  Formatting of this note might be different from the original. See spinal stenosis plan   Diabetes (HCC)    Diabetes mellitus due to underlying condition with unspecified complications (HCC) 11/04/2016   Dyshidrotic eczema 04/29/2021   Dysrhythmia    afib   Essential hypertension 11/04/2016   Facet arthritis of lumbar region 03/08/2018   Facet joint disease 11/17/2018   Last Assessment & Plan:  Formatting of this note might be different from the original. See spinal stenosis plan   Foraminal stenosis of lumbar region 03/08/2018   Heart murmur     History of knee replacement, total, bilateral 11/17/2018   Last Assessment & Plan:  Formatting of this note might be different from the original. Have recommended to her several times if any further concerns in regards to the pain status post arthroplasty, she is to follow up with her surgeon.   History of pulmonary embolism 11/28/2021   Hypertension    Hypertensive disorder 09/28/2018   Increased body mass index 09/28/2018   Lumbar radiculopathy 03/08/2018   Lumbar spondylosis 03/20/2019   Mixed dyslipidemia 11/04/2016   Morbid obesity (HCC) 11/04/2016   Muscle pain 04/29/2020   Osteoarthritis of left knee    PAF (paroxysmal atrial fibrillation) (HCC) 04/30/2015   Pain management contract agreement 04/13/2019   Last Assessment & Plan:  Formatting of this note might be different from the original. Contract updated today.  UDS completed.  [place] controlled substance registry reviewed and is consistent with her regimen.   Peripheral vascular disease (HCC) 09/28/2018   Preop cardiovascular exam 05/01/2019   Pressure injury of skin 11/21/2021   Pulmonary emboli [facility])    Pulmonary embolism (HCC) 11/21/2021   Renal insufficiency 04/30/2015   S/P insertion of spinal cord stimulator 07/13/2019  Last Assessment & Plan:  Formatting of this note might be different from the original. About 5 months status post insertion of spinal cord stimulator with excellent relief.  She continues to keep close follow-up with her representative and has been working to appropriately find best programs to get her adequate pain relief.   Sleep apnea    Smoker 09/28/2018   Spinal stenosis of lumbar region 03/20/2019   Spinal stenosis of lumbar region with neurogenic claudication 03/08/2018   Last Assessment & Plan:  Formatting of this note is different from the original. 77 year old female with chronic low back pain and bilateral, right greater than left, L4-5 radicular pain, and claudication. She has  substantial amount of multifactorial degenerative thoracic and lumbar spine pathology.  After being deemed an inappropriate open surgical candidate, and failing to respond to multiple in   Spondylolisthesis 03/20/2019   Spondylosis of lumbosacral region without myelopathy or radiculopathy 01/30/2019   Last Assessment & Plan:  Formatting of this note might be different from the original. See spinal stenosis plan   Tinea unguium 02/03/2022   Type 2 diabetes mellitus (HCC) 09/28/2018    Past Surgical History:  Procedure Laterality Date   ABDOMINAL HYSTERECTOMY     BACK SURGERY     BIOPSY  06/18/2022   Procedure: BIOPSY;  Surgeon: Lemar Lofty., MD;  Location: [facility] ENDOSCOPY;  Service: Gastroenterology;;   ESOPHAGOGASTRODUODENOSCOPY (EGD) WITH PROPOFOL N/A 06/18/2022   Procedure: ESOPHAGOGASTRODUODENOSCOPY (EGD) WITH PROPOFOL;  Surgeon: Lemar Lofty., MD;  Location: [facility] ENDOSCOPY;  Service: Gastroenterology;  Laterality: N/A;   EYE SURGERY     bilateral cataracts   IR ANGIOGRAM PULMONARY BILATERAL SELECTIVE  11/21/2021   IR ANGIOGRAM SELECTIVE EACH ADDITIONAL VESSEL  11/21/2021   IR ANGIOGRAM SELECTIVE EACH ADDITIONAL VESSEL  11/21/2021   IR THROMBECT PRIM MECH INIT (INCLU) MOD SED  11/21/2021   IR US GUIDE VASC ACCESS RIGHT  11/21/2021   JOINT REPLACEMENT Bilateral    knees   RADIOLOGY WITH ANESTHESIA N/A 11/21/2021   Procedure: IR WITH ANESTHESIA;  Surgeon: Radiologist, Medication, MD;  Location: [facility] OR;  Service: Radiology;  Laterality: N/A;   SAVORY DILATION N/A 06/18/2022   Procedure: SAVORY DILATION;  Surgeon: Meridee Score Netty Starring., MD;  Location: [facility] ENDOSCOPY;  Service: Gastroenterology;  Laterality: N/A;   SPINAL CORD STIMULATOR INSERTION N/A 07/07/2019   Procedure: LUMBAR SPINAL CORD STIMULATOR INSERTION;  Surgeon: Odette Fraction, MD;  Location: [facility] OR;  Service: Neurosurgery;  Laterality: N/A;   total left knee        Physical Exam   Vitals:   03/22/23 1645 03/22/23  1700 03/22/23 1715 03/22/23 1730  BP: (!) 96/59 90/60 (!) 85/56 (!) 82/63  Pulse: 62 83 77 68  Resp: 15 15 (!) 24 13  Temp:      TempSrc:      SpO2: 98% 100% 100% 97%    Physical Exam Vitals and nursing note reviewed.  Constitutional:      General: She is not in acute distress.    Appearance: She is well-developed.  HENT:     Head: Normocephalic and atraumatic.     Right Ear: External ear normal.     Left Ear: External ear normal.     Nose: Nose normal.     Mouth/Throat:     Mouth: Mucous membranes are dry.  Eyes:     Extraocular Movements: Extraocular movements intact.     Conjunctiva/sclera: Conjunctivae normal.     Pupils: Pupils are equal, round, and  reactive to light.  Cardiovascular:     Rate and Rhythm: Regular rhythm. Tachycardia present.     Heart sounds: No murmur heard. Pulmonary:     Effort: Pulmonary effort is normal. No respiratory distress.     Breath sounds: Rhonchi and rales present.  Abdominal:     Palpations: Abdomen is soft.     Tenderness: There is no abdominal tenderness. There is no right CVA tenderness or left CVA tenderness.  Genitourinary:    Comments: Stool examined by nurse, dark, no bright red blood. Heme occult positive Musculoskeletal:        General: No swelling.     Cervical back: Neck supple.  Skin:    General: Skin is warm and dry.     Capillary Refill: Capillary refill takes less than 2 seconds.     Comments: There is a fungal appearing rash in the bilateral inguinal creases.  No other obvious rash,  Neurological:     Mental Status: She is alert and oriented to person, place, and time. Mental status is at baseline.  Psychiatric:        Mood and Affect: Mood normal.    Procedures   If procedures were preformed on this patient, they are listed below:  Procedures  Staci Acosta, MD  Please note that this documentation was produced with the assistance of voice-to-text technology and may contain errors.    Gunnar Bulla,  MD 03/22/23 1815    Blane Ohara, MD 03/23/23 0001

## 2023-03-22 NOTE — Progress Notes (Addendum)
Pharmacy Antibiotic Note  Lisa Maxwell is a 77 y.o. female admitted on 03/22/2023 with aspiration pneumonia.  Pharmacy has been consulted for Zosyn (piperacillin-tazobactam) dosing.  Pt received Zosyn 3.375g IV x1 over 30 minutes in ED ~1842 on 12/16  WBC 12.2, Temp 96.7, LA 0.8 SCr 1.18  Plan: Zosyn 3.375g IV q8h (4 hour infusion). Monitor daily CBC, temp, SCr, and for clinical signs of improvement  F/u cultures and de-escalate antibiotics as able     Temp (24hrs), Avg:97.3 F (36.3 C), Min:96.7 F (35.9 C), Max:97.8 F (36.6 C)  Recent Labs  Lab 03/22/23 1513 03/22/23 1752  WBC 12.2*  --   CREATININE 1.18*  --   LATICACIDVEN  --  0.8    CrCl cannot be calculated (Unknown ideal weight.).    No Known Allergies  Antimicrobials this admission: Zosyn 12/16 >>   Dose adjustments this admission: N/A  Microbiology results: 12/16 Bcx x2: sent   Thank you for allowing pharmacy to be a part of this patient's care.  Wilburn Cornelia, PharmD, BCPS Clinical Pharmacist 03/22/2023 7:57 PM   Please refer to [facility] for pharmacy phone number

## 2023-03-22 NOTE — ED Notes (Signed)
2nd set of blood cultures not obtained by previous RN. 2nd set of blood cultures obtained at this time.

## 2023-03-23 ENCOUNTER — Inpatient Hospital Stay (HOSPITAL_COMMUNITY): Payer: Medicare HMO

## 2023-03-23 DIAGNOSIS — J690 Pneumonitis due to inhalation of food and vomit: Secondary | ICD-10-CM | POA: Diagnosis not present

## 2023-03-23 DIAGNOSIS — R748 Abnormal levels of other serum enzymes: Secondary | ICD-10-CM

## 2023-03-23 DIAGNOSIS — I4891 Unspecified atrial fibrillation: Secondary | ICD-10-CM

## 2023-03-23 DIAGNOSIS — K224 Dyskinesia of esophagus: Secondary | ICD-10-CM

## 2023-03-23 DIAGNOSIS — R1310 Dysphagia, unspecified: Secondary | ICD-10-CM

## 2023-03-23 DIAGNOSIS — E871 Hypo-osmolality and hyponatremia: Secondary | ICD-10-CM

## 2023-03-23 DIAGNOSIS — R195 Other fecal abnormalities: Secondary | ICD-10-CM

## 2023-03-23 DIAGNOSIS — K222 Esophageal obstruction: Secondary | ICD-10-CM | POA: Diagnosis not present

## 2023-03-23 DIAGNOSIS — D649 Anemia, unspecified: Secondary | ICD-10-CM

## 2023-03-23 DIAGNOSIS — I5030 Unspecified diastolic (congestive) heart failure: Secondary | ICD-10-CM

## 2023-03-23 DIAGNOSIS — Z7901 Long term (current) use of anticoagulants: Secondary | ICD-10-CM

## 2023-03-23 LAB — BASIC METABOLIC PANEL
Anion gap: 10 (ref 5–15)
Anion gap: 5 (ref 5–15)
Anion gap: 9 (ref 5–15)
BUN: 39 mg/dL — ABNORMAL HIGH (ref 8–23)
BUN: 46 mg/dL — ABNORMAL HIGH (ref 8–23)
BUN: 53 mg/dL — ABNORMAL HIGH (ref 8–23)
CO2: 23 mmol/L (ref 22–32)
CO2: 24 mmol/L (ref 22–32)
CO2: 27 mmol/L (ref 22–32)
Calcium: 10.1 mg/dL (ref 8.9–10.3)
Calcium: 9.8 mg/dL (ref 8.9–10.3)
Calcium: 9.9 mg/dL (ref 8.9–10.3)
Chloride: 93 mmol/L — ABNORMAL LOW (ref 98–111)
Chloride: 94 mmol/L — ABNORMAL LOW (ref 98–111)
Chloride: 94 mmol/L — ABNORMAL LOW (ref 98–111)
Creatinine, Ser: 1.07 mg/dL — ABNORMAL HIGH (ref 0.44–1.00)
Creatinine, Ser: 1.2 mg/dL — ABNORMAL HIGH (ref 0.44–1.00)
Creatinine, Ser: 1.2 mg/dL — ABNORMAL HIGH (ref 0.44–1.00)
GFR, Estimated: 47 mL/min — ABNORMAL LOW (ref >60)
GFR, Estimated: 47 mL/min — ABNORMAL LOW (ref >60)
GFR, Estimated: 54 mL/min — ABNORMAL LOW (ref >60)
Glucose, Bld: 106 mg/dL — ABNORMAL HIGH (ref 70–99)
Glucose, Bld: 117 mg/dL — ABNORMAL HIGH (ref 70–99)
Glucose, Bld: 126 mg/dL — ABNORMAL HIGH (ref 70–99)
Potassium: 4.1 mmol/L (ref 3.5–5.1)
Potassium: 4.1 mmol/L (ref 3.5–5.1)
Potassium: 4.5 mmol/L (ref 3.5–5.1)
Sodium: 125 mmol/L — ABNORMAL LOW (ref 135–145)
Sodium: 127 mmol/L — ABNORMAL LOW (ref 135–145)
Sodium: 127 mmol/L — ABNORMAL LOW (ref 135–145)

## 2023-03-23 LAB — URINALYSIS, W/ REFLEX TO CULTURE (INFECTION SUSPECTED)
Bilirubin Urine: NEGATIVE
Glucose, UA: NEGATIVE mg/dL
Ketones, ur: NEGATIVE mg/dL
Nitrite: POSITIVE — AB
Protein, ur: NEGATIVE mg/dL
Specific Gravity, Urine: 1.005 (ref 1.005–1.030)
pH: 7 (ref 5.0–8.0)

## 2023-03-23 LAB — SODIUM, URINE, RANDOM: Sodium, Ur: 40 mmol/L

## 2023-03-23 LAB — CBC
HCT: 27.9 % — ABNORMAL LOW (ref 36.0–46.0)
Hemoglobin: 9.3 g/dL — ABNORMAL LOW (ref 12.0–15.0)
MCH: 29.2 pg (ref 26.0–34.0)
MCHC: 33.3 g/dL (ref 30.0–36.0)
MCV: 87.5 fL (ref 80.0–100.0)
Platelets: 351 10*3/uL (ref 150–400)
RBC: 3.19 MIL/uL — ABNORMAL LOW (ref 3.87–5.11)
RDW: 15.3 % (ref 11.5–15.5)
WBC: 9 10*3/uL (ref 4.0–10.5)
nRBC: 0 % (ref 0.0–0.2)

## 2023-03-23 LAB — FERRITIN: Ferritin: 170 ng/mL (ref 11–307)

## 2023-03-23 LAB — IRON AND TIBC
Iron: 23 ug/dL — ABNORMAL LOW (ref 28–170)
Saturation Ratios: 10 % — ABNORMAL LOW (ref 10.4–31.8)
TIBC: 235 ug/dL — ABNORMAL LOW (ref 250–450)
UIBC: 212 ug/dL

## 2023-03-23 LAB — OSMOLALITY, URINE: Osmolality, Ur: 219 mosm/kg — ABNORMAL LOW (ref 300–900)

## 2023-03-23 LAB — FOLATE: Folate: 27.6 ng/mL (ref >5.9)

## 2023-03-23 MED ORDER — FLUTICASONE FUROATE-VILANTEROL 100-25 MCG/ACT IN AEPB
1.0000 | INHALATION_SPRAY | Freq: Every day | RESPIRATORY_TRACT | Status: DC
Start: 2023-03-23 — End: 2023-04-02
  Administered 2023-03-24: 2 via RESPIRATORY_TRACT
  Administered 2023-03-25 – 2023-04-02 (×9): 1 via RESPIRATORY_TRACT
  Filled 2023-03-23 (×2): qty 28

## 2023-03-23 MED ORDER — SODIUM CHLORIDE 0.9 % IV BOLUS
500.0000 mL | Freq: Once | INTRAVENOUS | Status: AC
  Administered 2023-03-23: 500 mL via INTRAVENOUS

## 2023-03-23 MED ORDER — SODIUM CHLORIDE 0.9 % IV SOLN
3.0000 g | Freq: Two times a day (BID) | INTRAVENOUS | Status: DC
  Administered 2023-03-23 – 2023-03-24 (×2): 3 g via INTRAVENOUS
  Filled 2023-03-23 (×2): qty 8

## 2023-03-23 MED ORDER — CHLORHEXIDINE GLUCONATE CLOTH 2 % EX PADS
6.0000 | MEDICATED_PAD | Freq: Every day | CUTANEOUS | Status: DC
  Administered 2023-03-23 – 2023-04-02 (×11): 6 via TOPICAL

## 2023-03-23 NOTE — ED Notes (Signed)
Left leg massaged for leg cramps.

## 2023-03-23 NOTE — Consult Note (Signed)
Consultation Note   Referring Provider:  Internal Medicine Teaching Service PCP: Paulina Fusi, MD Primary Gastroenterologist: Lynann Bologna, MD        Reason for Consultation: Dysphagia  DOA: 03/22/2023         Hospital Day: 2   ASSESSMENT    Brief Narrative:  77 y.o. year old female with a history of COPD, chronic CHF, PE, DM, AFIB with RVR, CKD ,esophageal dysmotility, GERD, history of Schatzki's ring s/p dilation March 2024.   Recurrent solid food dysphagia. History of non-obstructing Schatzki's ring s/p dilation in March 2024. Recurrent solid dysphagia possible secondary to worsening esophageal dysmotility. Candida esophagitis possible but less likely. Also suspect pharyngeal dysphagia with possible aspiration.  She had a very recent esophogram at [facility]. Results not in Epic but I contacted PCP and had RN read report of esophagram which ,other than some tertiary contractions was unremarkable. MBSS seem to suggest pharyngeal dysphagia . No overt aspiration but views were limited.  I have requested reports be faxed over for further details.    AFIB, on Eliquis.  Last dose, possibly 12/16 prior to admission.   [place] anemia / FOBT+ .  Hgb 9.3 - 10.4. Baseline hgb around 12.   Aspiration pneumonia On antibiotics. Has productive cough. No SHOB  Elevated lipase - 376 No abdominal pain.    Principal Problem:   Esophageal dysphagia Active Problems:   COPD (chronic obstructive pulmonary disease) (HCC)   Decubitus skin ulcer   Aspiration pneumonia (HCC)   Hyponatremia   Chronic CHF (congestive heart failure) (HCC)   Atrial fibrillation with RVR (HCC)   GI bleeding     PLAN:   -Will proceed with EGD tomorrow though may be low yield.  The risks and benefits of EGD with possible biopsies were discussed with the patient who agrees to proceed.  --Continue Pantoprazole for history of GERD --Check iron studies, B12,  folate --Repeat lipase in am. Consider abdominal imaging.  --Due to suspected oropharyngeal dysphagia she doesn't need to have thin liquids for now. Also, husband describes acute onset of dysphagia does she need head CT ?    HPI   Lisa Maxwell presented to ED yesterday with weakness and decreased PO intake 2/2 to solid food dysphagia.  She has known esophageal dysmotility as well as a nonobstructing Schatzki's ring which was dilated in March 2024. Post dilation she had complete resolution of dysphagia and continued to do well until 2-3 weeks when she suddenly developed recurrent solid food dysphagia.  She has had multiple antibiotics in the recent past months.  No odynophagia.  Sounds like she saw her PCP for dysphagia and was sent for barium swallow at [facility].  Results are not available.    Notable labs / Imaging / Events this admission  :  WBC 12.2 Hgb 10.4 MCV 87 Na 125 >> 127 today BUN 53 Cr 1.2 Lipase 376 CXR >>  Cardiac enlargement with mild central congestion. Ovoid right upper lung opacity, this could represent acute pulmonary infiltrate with lung nodule not excluded. Short interval radiographic follow-up is recommended.  Previous GI Evaluations   EGD 06/18/22 - No gross lesions in the entire esophagus. - Non-obstructing Schatzki ring? disrupted. - Z-line regular, 35 cm  from the incisors. - Savory dilation performed of the esophagus with mucosal rent just below the UES noted after 16 mm dilation. - 2 cm hiatal hernia. - Erythematous mucosa in the stomach. Biopsied. - No gross lesions in the duodenal bulb, in the first portion of the duodenum and in the second portion of the duodenum. Biopsied.  Barium swallow 06/10/22 IMPRESSION: 1. Narrowing of the distal esophagus which does not allow passage of the 13mm barium tablet.  2.  Tertiary contractions leading to moderate dysmotility.   Colonoscopy history is unknown  Labs and Imaging: Recent Labs    03/22/23 1513  03/23/23 0738  WBC 12.2* 9.0  HGB 10.4* 9.3*  HCT 31.4* 27.9*  PLT 333 351   Recent Labs    03/22/23 1513 03/22/23 2345 03/23/23 0415  NA 125* 125* 127*  K 4.6 4.1 4.5  CL 91* 93* 94*  CO2 23 27 24   GLUCOSE 95 126* 106*  BUN 63* 53* 46*  CREATININE 1.18* 1.20* 1.20*  CALCIUM 10.3 9.8 10.1   No results for input(s): "PROT", "ALBUMIN", "AST", "ALT", "ALKPHOS", "BILITOT", "BILIDIR", "IBILI" in the last 72 hours. No results for input(s): "HEPBSAG", "HCVAB", "HEPAIGM", "HEPBIGM" in the last 72 hours. Recent Labs    03/22/23 1513  LABPROT 23.0*  INR 2.0*      Past Medical History:  Diagnosis Date   CAD (coronary artery disease) 11/04/2016   Cancer (HCC)    skin cancer   Cauda equina compression (HCC) 08/28/2021   Cerebrovascular disease    Chronic atrial fibrillation (HCC) 09/28/2018   Chronic bilateral low back pain without sciatica 03/08/2018   Chronic kidney disease    Chronic obstructive lung disease (HCC) 09/28/2018   Chronic pain syndrome 03/08/2018   Chronic renal failure 09/28/2018   Chronic, continuous use of opioids 03/04/2021   Last Assessment & Plan:  Formatting of this note might be different from the original. Patient and I have discussed the hazardous effects of continued opiate pain medication usage. Risks and benefits of above medications including but not limited to possibility of respiratory depression, sedation, and even death were discussed with the patient who expressed an understanding.  Patient did not displ   CKD (chronic kidney disease) stage 3, GFR 30-59 ml/min (HCC)    Class 1 obesity due to excess calories with serious comorbidity and body mass index (BMI) of 34.0 to 34.9 in adult 05/20/2021   Last Assessment & Plan: Formatting of this note might be different from the original. Patient educated about the detrimental effects of weight as it specifically pertains to pain management and overall health.  Patient's BMI 34.08 Encouraged healthy eating  habits and routine low-impact cardiovascular exercises as tolerated.   Comprehensive diabetic foot examination, type 2 DM, encounter for (HCC) 04/29/2021   DDD (degenerative disc disease), lumbar 11/17/2018   Last Assessment & Plan:  Formatting of this note might be different from the original. See spinal stenosis plan   Diabetes (HCC)    Diabetes mellitus due to underlying condition with unspecified complications (HCC) 11/04/2016   Dyshidrotic eczema 04/29/2021   Dysrhythmia    afib   Essential hypertension 11/04/2016   Facet arthritis of lumbar region 03/08/2018   Facet joint disease 11/17/2018   Last Assessment & Plan:  Formatting of this note might be different from the original. See spinal stenosis plan   Foraminal stenosis of lumbar region 03/08/2018   Heart murmur    History of knee replacement, total, bilateral 11/17/2018   Last  Assessment & Plan:  Formatting of this note might be different from the original. Have recommended to her several times if any further concerns in regards to the pain status post arthroplasty, she is to follow up with her surgeon.   History of pulmonary embolism 11/28/2021   Hypertension    Hypertensive disorder 09/28/2018   Increased body mass index 09/28/2018   Lumbar radiculopathy 03/08/2018   Lumbar spondylosis 03/20/2019   Mixed dyslipidemia 11/04/2016   Morbid obesity (HCC) 11/04/2016   Muscle pain 04/29/2020   Osteoarthritis of left knee    PAF (paroxysmal atrial fibrillation) (HCC) 04/30/2015   Pain management contract agreement 04/13/2019   Last Assessment & Plan:  Formatting of this note might be different from the original. Contract updated today.  UDS completed.  [place] controlled substance registry reviewed and is consistent with her regimen.   Peripheral vascular disease (HCC) 09/28/2018   Preop cardiovascular exam 05/01/2019   Pressure injury of skin 11/21/2021   Pulmonary emboli [facility])    Pulmonary embolism (HCC) 11/21/2021    Renal insufficiency 04/30/2015   S/P insertion of spinal cord stimulator 07/13/2019   Last Assessment & Plan:  Formatting of this note might be different from the original. About 5 months status post insertion of spinal cord stimulator with excellent relief.  She continues to keep close follow-up with her representative and has been working to appropriately find best programs to get her adequate pain relief.   Sleep apnea    Smoker 09/28/2018   Spinal stenosis of lumbar region 03/20/2019   Spinal stenosis of lumbar region with neurogenic claudication 03/08/2018   Last Assessment & Plan:  Formatting of this note is different from the original. 77 year old female with chronic low back pain and bilateral, right greater than left, L4-5 radicular pain, and claudication. She has substantial amount of multifactorial degenerative thoracic and lumbar spine pathology.  After being deemed an inappropriate open surgical candidate, and failing to respond to multiple in   Spondylolisthesis 03/20/2019   Spondylosis of lumbosacral region without myelopathy or radiculopathy 01/30/2019   Last Assessment & Plan:  Formatting of this note might be different from the original. See spinal stenosis plan   Tinea unguium 02/03/2022   Type 2 diabetes mellitus (HCC) 09/28/2018    Past Surgical History:  Procedure Laterality Date   ABDOMINAL HYSTERECTOMY     BACK SURGERY     BIOPSY  06/18/2022   Procedure: BIOPSY;  Surgeon: Lemar Lofty., MD;  Location: [facility] ENDOSCOPY;  Service: Gastroenterology;;   ESOPHAGOGASTRODUODENOSCOPY (EGD) WITH PROPOFOL N/A 06/18/2022   Procedure: ESOPHAGOGASTRODUODENOSCOPY (EGD) WITH PROPOFOL;  Surgeon: Lemar Lofty., MD;  Location: [facility] ENDOSCOPY;  Service: Gastroenterology;  Laterality: N/A;   EYE SURGERY     bilateral cataracts   IR ANGIOGRAM PULMONARY BILATERAL SELECTIVE  11/21/2021   IR ANGIOGRAM SELECTIVE EACH ADDITIONAL VESSEL  11/21/2021   IR ANGIOGRAM SELECTIVE EACH  ADDITIONAL VESSEL  11/21/2021   IR THROMBECT PRIM MECH INIT (INCLU) MOD SED  11/21/2021   IR US GUIDE VASC ACCESS RIGHT  11/21/2021   JOINT REPLACEMENT Bilateral    knees   RADIOLOGY WITH ANESTHESIA N/A 11/21/2021   Procedure: IR WITH ANESTHESIA;  Surgeon: Radiologist, Medication, MD;  Location: [facility] OR;  Service: Radiology;  Laterality: N/A;   SAVORY DILATION N/A 06/18/2022   Procedure: SAVORY DILATION;  Surgeon: Meridee Score Netty Starring., MD;  Location: [facility] ENDOSCOPY;  Service: Gastroenterology;  Laterality: N/A;   SPINAL CORD STIMULATOR INSERTION N/A 07/07/2019   Procedure:  LUMBAR SPINAL CORD STIMULATOR INSERTION;  Surgeon: Odette Fraction, MD;  [facility] OR;  Service: Neurosurgery;  Laterality: N/A;   total left knee      Family History  Problem Relation Age of Onset   Diabetes Mother     Prior to Admission medications   Medication Sig Start Date End Date Taking? Authorizing Provider  apixaban (ELIQUIS) 5 MG TABS tablet TAKE 1 TABLET BY MOUTH TWICE A DAY 12/30/22  Yes Revankar, Aundra Dubin, MD  bethanechol (URECHOLINE) 50 MG tablet Take 50 mg by mouth 3 (three) times daily. 07/23/22  Yes [provider]  Coenzyme Q10 (CO Q-10) 200 MG CAPS Take 200 mg by mouth in the morning.   Yes [provider]  Cyanocobalamin (VITAMIN B-12) 500 MCG SUBL Place 500 mcg under the tongue in the morning.   Yes [provider]  docusate sodium (COLACE) 100 MG capsule Take 100 mg by mouth 2 (two) times daily as needed for mild constipation or moderate constipation. 10/08/21  Yes [provider]  fluticasone furoate-vilanterol (BREO ELLIPTA) 100-25 MCG/ACT AEPB Inhale 1-2 puffs into the lungs daily.   Yes [provider]  furosemide (LASIX) 40 MG tablet Take 1 tablet (40 mg total) by mouth 2 (two) times daily. 12/07/22  Yes Rhetta Mura, MD  gabapentin (NEURONTIN) 600 MG tablet Take 1 tablet (600 mg total) by mouth at bedtime. 12/07/22  Yes Rhetta Mura, MD  Iron,  Ferrous Sulfate, 325 (65 Fe) MG TABS Take 65 mg of iron by mouth daily with breakfast.   Yes [provider]  KLOR-CON M20 20 MEQ tablet Take 20 mEq by mouth at bedtime. 01/11/23  Yes [provider]  latanoprost (XALATAN) 0.005 % ophthalmic solution Place 1 drop into both eyes at bedtime. 11/17/21  Yes [provider]  lovastatin (MEVACOR) 40 MG tablet Take 40 mg by mouth at bedtime.    Yes [provider]  methocarbamol (ROBAXIN) 500 MG tablet Take 0.5 tablets (250 mg total) by mouth 3 (three) times daily. Patient taking differently: Take 250 mg by mouth 4 (four) times daily. 12/07/22  Yes Rhetta Mura, MD  Multiple Vitamins-Minerals (CENTRUM SILVER 50+WOMEN) TABS Take 1 tablet by mouth daily with breakfast.   Yes [provider]  nystatin cream (MYCOSTATIN) Apply 1 Application topically 2 (two) times daily. 10/14/22  Yes [provider]  Oxycodone HCl 10 MG TABS Take 10 mg by mouth 4 (four) times daily as needed.   Yes [provider]  pantoprazole (PROTONIX) 40 MG tablet Take 1 tablet (40 mg total) by mouth daily. Patient taking differently: Take 40 mg by mouth in the morning. 09/02/22  Yes Lynann Bologna, MD  RYBELSUS 3 MG TABS Take 3 mg by mouth daily before breakfast. 05/12/21  Yes [provider]  spironolactone (ALDACTONE) 25 MG tablet Take 25 mg by mouth every morning. 01/04/23  Yes [provider]  tamsulosin (FLOMAX) 0.4 MG CAPS capsule Take 0.4 mg by mouth 2 (two) times daily. 07/23/22  Yes [provider]  triamcinolone ointment (KENALOG) 0.5 % Apply 1 Application topically at bedtime.   Yes [provider]  Turmeric (QC TUMERIC COMPLEX PO) Take 1 tablet by mouth at bedtime.   Yes [provider]  albuterol (VENTOLIN HFA) 108 (90 Base) MCG/ACT inhaler Inhale into the lungs every 6 (six) hours as needed for wheezing or shortness of breath. Patient not taking: Reported on 03/22/2023     [provider]  lidocaine (LIDODERM) 5 %  Place 1 patch onto the skin daily. Remove & Discard patch within 12 hours or as directed by MD Patient not taking: Reported on 03/22/2023 12/07/22   Rhetta Mura, MD  naloxone [facility]) nasal spray 4 mg/0.1 mL Place 1 spray into the nose daily as needed (accidental overdose). Patient not taking: Reported on 03/22/2023    [provider]  nitroGLYCERIN (NITROSTAT) 0.4 MG SL tablet Place 0.4 mg under the tongue every 5 (five) minutes as needed for chest pain. Patient not taking: Reported on 03/22/2023    [provider]    Current Facility-Administered Medications  Medication Dose Route Frequency Provider Last Rate Last Admin   Ampicillin-Sulbactam (UNASYN) 3 g in sodium chloride 0.9 % 100 mL IVPB  3 g Intravenous Q12H Daylene Posey, RPH       Chlorhexidine Gluconate Cloth 2 % PADS 6 each  6 each Topical Daily Pyrtle, Carie Caddy, MD   6 each at 03/23/23 1412   fluticasone furoate-vilanterol (BREO ELLIPTA) 100-25 MCG/ACT 1-2 puff  1-2 puff Inhalation Daily Marrianne Mood, MD       methocarbamol (ROBAXIN) tablet 250 mg  250 mg Oral TID Marrianne Mood, MD   250 mg at 03/23/23 0932   nystatin cream (MYCOSTATIN) 1 Application  1 Application Topical BID Marrianne Mood, MD   1 Application at 03/23/23 0932   oxyCODONE (Oxy IR/ROXICODONE) immediate release tablet 10 mg  10 mg Oral QID PRN Reymundo Poll, MD   10 mg at 03/23/23 1412   pantoprazole (PROTONIX) EC tablet 40 mg  40 mg Oral Daily Marrianne Mood, MD   40 mg at 03/23/23 0932   pravastatin (PRAVACHOL) tablet 40 mg  40 mg Oral q1800 Marrianne Mood, MD       sodium chloride flush (NS) 0.9 % injection 3 mL  3 mL Intravenous PRN Marrianne Mood, MD        Allergies as of 03/22/2023   (No Known Allergies)    Social History   Socioeconomic History   Marital status: Married    Spouse name: Not on file   Number of children: 2   Years of education: Not on  file   Highest education level: Not on file  Occupational History   Not on file  Tobacco Use   Smoking status: Some Days    Current packs/day: 0.00    Average packs/day: 0.5 packs/day for 62.0 years (31.0 ttl pk-yrs)    Types: Cigarettes    Start date: 05/07/1957    Last attempt to quit: 05/08/2019    Years since quitting: 3.8    Passive exposure: Past   Smokeless tobacco: Never  Vaping Use   Vaping status: Never Used  Substance and Sexual Activity   Alcohol use: No   Drug use: No   Sexual activity: Not Currently  Other Topics Concern   Not on file  Social History Narrative   Not on file   Social Drivers of Health   Financial Resource Strain: Low Risk  (07/31/2022)   Overall Financial Resource Strain (CARDIA)    Difficulty of Paying Living Expenses: Not hard at all  Food Insecurity: No Food Insecurity (03/22/2023)   Hunger Vital Sign    Worried About Running Out of Food in the Last Year: Never true    Ran Out of Food in the Last Year: Never true  Transportation Needs: No Transportation Needs (03/22/2023)   PRAPARE - Administrator, Civil Service (Medical): No    Lack of Transportation (Non-Medical):  No  Physical Activity: Unknown (07/31/2022)   Exercise Vital Sign    Days of Exercise per Week: Not on file    Minutes of Exercise per Session: 0 min  Stress: Not on file  Social Connections: Not on file  Intimate Partner Violence: Not At Risk (03/22/2023)   Humiliation, Afraid, Rape, and Kick questionnaire    Fear of Current or Ex-Partner: No    Emotionally Abused: No    Physically Abused: No    Sexually Abused: No     Code Status   Code Status: Full Code  Review of Systems: All systems reviewed and negative except where noted in HPI.  Physical Exam: Vital signs in last 24 hours: Temp:  [96.7 F (35.9 C)-98.7 F (37.1 C)] 97.9 F (36.6 C) (12/17 1332) Pulse Rate:  [54-96] 96 (12/17 1332) Resp:  [13-31] 18 (12/17 1332) BP: (80-126)/(43-81) 104/43  (12/17 1332) SpO2:  [90 %-100 %] 100 % (12/17 1332) Weight:  [54 kg-65.3 kg] 54 kg (12/17 1331)    General:  Pleasant female in NAD Psych:  Cooperative. Normal mood and affect Eyes: Pupils equal Ears:  Normal auditory acuity Nose: No deformity, discharge or lesions Neck:  Supple, no masses felt Lungs:  Bilateral rhonchi, decreased breath sounds. .  Heart:  Regular rate.  Abdomen:  Soft, nondistended, nontender, active bowel sounds, no masses felt Rectal :  Deferred Msk: Symmetrical without gross deformities.  Neurologic:  Alert, oriented, grossly normal neurologically Extremities : No edema Skin:  Intact without significant lesions.    Intake/Output from previous day: 12/16 0701 - 12/17 0700 In: -  Out: 1000 [Urine:1000] Intake/Output this shift:  Total I/O In: 84.3 [IV Piggyback:84.3] Out: 750 [Urine:750]   Willette Cluster, NP-C   03/23/2023, 2:34 PM

## 2023-03-23 NOTE — Progress Notes (Signed)
Pharmacy Antibiotic Note  Lisa Maxwell is a 77 y.o. female admitted on 03/22/2023 with concern for aspiration pna.  Pharmacy has been consulted to narrow from zosyn to Unasyn.  Plan: Unasyn 3g IV every 12 hours Monitor renal function, clinical progression and LOT  Height: 4\' 8"  (142.2 cm) Weight: 65.3 kg (144 lb) IBW/kg (Calculated) : 36.3  Temp (24hrs), Avg:97.8 F (36.6 C), Min:96.7 F (35.9 C), Max:98.7 F (37.1 C)  Recent Labs  Lab 03/22/23 1513 03/22/23 1752 03/22/23 2345 03/23/23 0415 03/23/23 0738  WBC 12.2*  --   --   --  9.0  CREATININE 1.18*  --  1.20* 1.20*  --   LATICACIDVEN  --  0.8  --   --   --     Estimated Creatinine Clearance: 30.2 mL/min (A) (by C-G formula based on SCr of 1.2 mg/dL (H)).    No Known Allergies  Daylene Posey, PharmD, [facility] Clinical Pharmacist ED Pharmacist Phone # [phone] 03/23/2023 11:19 AM

## 2023-03-23 NOTE — Evaluation (Signed)
Physical Therapy Evaluation Patient Details Name: Lisa Maxwell MRN: [id number] DOB: 1945/12/27 Today's Date: 03/23/2023  History of Present Illness  77 yo female presents 12/16 with generalized weakness and poor oral intake and is admitted for concerns of aspiration pneumonia and hypovolemic hyponatremia secondary to esophageal dysphagia.Found to have aspiration PNA, hyponatremia, in Afib, dehydration and GI bleed  PMH of esophageal strictures s/p dilation procedures, prior saddle embolus (11/2021), atrial fibrillation on Eliquis, HFpEF, type 2 diabetes, CAD, and chronic low back pain s/p spinal cord stimulation insertion AD, cerebrovascular disease, a-fib, COPD, chronic pain syndrome, renal failure, DDD, HTN, bil TKA, PVD, spinal stenosis lumbar spine with neurogenic claudication, cauda equina compression CAD CKD  Clinical Impression  PTA pt living with husband in multistory home with ramped entrance, hospital bed in dining room, and 4 steps with stair lift to bathroom. Pt reports supervision level for coming to EoB, assist for donning back brace and AFOs, supervision for pivot transfers to wheelchair, BSC, and car. Pt is currently limited in safe mobility by pain in sacrum from lying in stretcher and in back and LE with movement. Pt able to move bilateral LE for AAROM, strength R>L, attempted to roll on stretcher but unable to due to pain. Pt to be admitted to room soon, will reassess bed mobility and transfers at that time. Pt and husband agreeable continued inpatient follow up therapy, <3 hours/day and request [facility] and Rehab. PT will continue to follow acutely.          If plan is discharge home, recommend the following: A lot of help with walking and/or transfers;A lot of help with bathing/dressing/bathroom;Assistance with cooking/housework;Assistance with feeding;Direct supervision/assist for medications management;Direct supervision/assist for financial management;Assist for  transportation;Help with stairs or ramp for entrance   Can travel by private vehicle   No    Equipment Recommendations None recommended by PT  Recommendations for Other Services  OT consult    Functional Status Assessment Patient has had a recent decline in their functional status and demonstrates the ability to make significant improvements in function in a reasonable and predictable amount of time.     Precautions / Restrictions Precautions Precautions: Fall Restrictions Weight Bearing Restrictions Per Provider Order: No Other Position/Activity Restrictions: wears TLSO OOB, and AFOs for therapy and transfers to wheelchair when she is leaving home      Mobility  Bed Mobility Overal bed mobility: Needs Assistance Bed Mobility: Rolling Rolling: Max assist         General bed mobility comments: attempted rolling onto R side to sit on edge of stretcher, however pt unable to tolerate due to back pain.           Pertinent Vitals/Pain Pain Assessment Pain Assessment: Faces Faces Pain Scale: Hurts a little bit Pain Location: sacrum with laying on stretcher Pain Descriptors / Indicators: Aching, Sore Pain Intervention(s): Limited activity within patient's tolerance, Monitored during session, Repositioned    Home Living Family/patient expects to be discharged to:: Private residence Living Arrangements: Spouse/significant other Available Help at Discharge: Family;Available 24 hours/day Type of Home: House Home Access: Ramped entrance     Alternate Level Stairs-Number of Steps: 4 steps - Has stair lift Home Layout: Two level;Bed/bath upstairs;Able to live on main level with bedroom/bathroom Home Equipment: Rolling Walker (2 wheels);Wheelchair - manual;Hospital bed;BSC/3in1 Additional Comments: has a stair lift    Prior Function Prior Level of Function : Needs assist             Mobility  Comments: with supervision is able to transfer from bed to wheelchair ADLs  Comments: spouse assists with ADLs and iADLs     Extremity/Trunk Assessment   Upper Extremity Assessment Upper Extremity Assessment: Generalized weakness    Lower Extremity Assessment Lower Extremity Assessment: RLE deficits/detail;LLE deficits/detail RLE Deficits / Details: AAROM hip flex, knee flex/ext and ankle plantar/dorsiflexion lacking full end range due to painful stiffness, strength grossly 2+/5 RLE Sensation: WNL RLE Coordination: decreased fine motor;decreased gross motor LLE Deficits / Details: AAROM hip flex, knee flex/ext and ankle plantar/dorsiflexion lacking full end range due to painful stiffness, strength grossly 2/5 LLE Sensation: WNL LLE Coordination: decreased fine motor;decreased gross motor    Cervical / Trunk Assessment Cervical / Trunk Assessment: Kyphotic  Communication   Communication Communication: Hearing impairment Cueing Techniques: Verbal cues;Tactile cues;Visual cues  Cognition Arousal: Alert Behavior During Therapy: WFL for tasks assessed/performed Overall Cognitive Status: Within Functional Limits for tasks assessed                                          General Comments General comments (skin integrity, edema, etc.): pt in and out of Afib during session, SpO2 on RA 98-100%O2 when waveform is good, wound noted on L medial ankle with dried blood, pt reports soreness but was unaware of bleeding    Exercises General Exercises - Lower Extremity Ankle Circles/Pumps: AAROM, Both, 10 reps, Supine Heel Slides: AAROM, Both, 5 reps, Supine Hip Flexion/Marching: AAROM, Both, 5 reps, Supine   Assessment/Plan    PT Assessment Patient needs continued PT services  PT Problem List Decreased strength;Decreased range of motion;Decreased activity tolerance;Decreased balance;Decreased mobility;Cardiopulmonary status limiting activity;Decreased skin integrity;Pain       PT Treatment Interventions DME instruction;Functional mobility  training;Therapeutic activities;Therapeutic exercise;Balance training;Patient/family education    PT Goals (Current goals can be found in the Care Plan section)  Acute Rehab PT Goals PT Goal Formulation: With patient/family Time For Goal Achievement: 04/06/23 Potential to Achieve Goals: Fair    Frequency Min 1X/week        AM-PAC PT "6 Clicks" Mobility  Outcome Measure Help needed turning from your back to your side while in a flat bed without using bedrails?: A Lot Help needed moving from lying on your back to sitting on the side of a flat bed without using bedrails?: Total Help needed moving to and from a bed to a chair (including a wheelchair)?: Total Help needed standing up from a chair using your arms (e.g., wheelchair or bedside chair)?: Total Help needed to walk in hospital room?: Total Help needed climbing 3-5 steps with a railing? : Total 6 Click Score: 7    End of Session   Activity Tolerance: Patient limited by pain Patient left: with call bell/phone within reach;with family/visitor present Nurse Communication: Mobility status (limited by stretcher) PT Visit Diagnosis: Muscle weakness (generalized) (M62.81);Difficulty in walking, not elsewhere classified (R26.2);Pain Pain - Right/Left:  (bilateral) Pain - part of body: Leg (back)    Time: 0272-5366 PT Time Calculation (min) (ACUTE ONLY): 22 min   Charges:   PT Evaluation $PT Eval Moderate Complexity: 1 Mod PT Treatments $Therapeutic Exercise: 8-22 mins PT General Charges $$ ACUTE PT VISIT: 1 Visit         Chanler Mendonca B. Beverely Risen PT, DPT Acute Rehabilitation Services Please use secure chat or  Call Office [phone]   Elon Alas [facility] 03/23/2023,  12:38 PM

## 2023-03-23 NOTE — ED Notes (Signed)
Central monitoring notified for transfer of monitor.

## 2023-03-23 NOTE — Evaluation (Signed)
Clinical/Bedside Swallow Evaluation Patient Details  Name: Lisa Maxwell MRN: [id number] Date of Birth: 09/02/1945  Today's Date: 03/23/2023 Time: SLP Start Time (ACUTE ONLY): 1031 SLP Stop Time (ACUTE ONLY): 1109 SLP Time Calculation (min) (ACUTE ONLY): 38 min  Past Medical History:  Past Medical History:  Diagnosis Date   CAD (coronary artery disease) 11/04/2016   Cancer (HCC)    skin cancer   Cauda equina compression (HCC) 08/28/2021   Cerebrovascular disease    Chronic atrial fibrillation (HCC) 09/28/2018   Chronic bilateral low back pain without sciatica 03/08/2018   Chronic kidney disease    Chronic obstructive lung disease (HCC) 09/28/2018   Chronic pain syndrome 03/08/2018   Chronic renal failure 09/28/2018   Chronic, continuous use of opioids 03/04/2021   Last Assessment & Plan:  Formatting of this note might be different from the original. Patient and I have discussed the hazardous effects of continued opiate pain medication usage. Risks and benefits of above medications including but not limited to possibility of respiratory depression, sedation, and even death were discussed with the patient who expressed an understanding.  Patient did not displ   CKD (chronic kidney disease) stage 3, GFR 30-59 ml/min (HCC)    Class 1 obesity due to excess calories with serious comorbidity and body mass index (BMI) of 34.0 to 34.9 in adult 05/20/2021   Last Assessment & Plan: Formatting of this note might be different from the original. Patient educated about the detrimental effects of weight as it specifically pertains to pain management and overall health.  Patient's BMI 34.08 Encouraged healthy eating habits and routine low-impact cardiovascular exercises as tolerated.   Comprehensive diabetic foot examination, type 2 DM, encounter for (HCC) 04/29/2021   DDD (degenerative disc disease), lumbar 11/17/2018   Last Assessment & Plan:  Formatting of this note might be different from the  original. See spinal stenosis plan   Diabetes (HCC)    Diabetes mellitus due to underlying condition with unspecified complications (HCC) 11/04/2016   Dyshidrotic eczema 04/29/2021   Dysrhythmia    afib   Essential hypertension 11/04/2016   Facet arthritis of lumbar region 03/08/2018   Facet joint disease 11/17/2018   Last Assessment & Plan:  Formatting of this note might be different from the original. See spinal stenosis plan   Foraminal stenosis of lumbar region 03/08/2018   Heart murmur    History of knee replacement, total, bilateral 11/17/2018   Last Assessment & Plan:  Formatting of this note might be different from the original. Have recommended to her several times if any further concerns in regards to the pain status post arthroplasty, she is to follow up with her surgeon.   History of pulmonary embolism 11/28/2021   Hypertension    Hypertensive disorder 09/28/2018   Increased body mass index 09/28/2018   Lumbar radiculopathy 03/08/2018   Lumbar spondylosis 03/20/2019   Mixed dyslipidemia 11/04/2016   Morbid obesity (HCC) 11/04/2016   Muscle pain 04/29/2020   Osteoarthritis of left knee    PAF (paroxysmal atrial fibrillation) (HCC) 04/30/2015   Pain management contract agreement 04/13/2019   Last Assessment & Plan:  Formatting of this note might be different from the original. Contract updated today.  UDS completed.  [place] controlled substance registry reviewed and is consistent with her regimen.   Peripheral vascular disease (HCC) 09/28/2018   Preop cardiovascular exam 05/01/2019   Pressure injury of skin 11/21/2021   Pulmonary emboli [facility])    Pulmonary embolism (HCC) 11/21/2021  Renal insufficiency 04/30/2015   S/P insertion of spinal cord stimulator 07/13/2019   Last Assessment & Plan:  Formatting of this note might be different from the original. About 5 months status post insertion of spinal cord stimulator with excellent relief.  She continues to keep close  follow-up with her representative and has been working to appropriately find best programs to get her adequate pain relief.   Sleep apnea    Smoker 09/28/2018   Spinal stenosis of lumbar region 03/20/2019   Spinal stenosis of lumbar region with neurogenic claudication 03/08/2018   Last Assessment & Plan:  Formatting of this note is different from the original. 77 year old female with chronic low back pain and bilateral, right greater than left, L4-5 radicular pain, and claudication. She has substantial amount of multifactorial degenerative thoracic and lumbar spine pathology.  After being deemed an inappropriate open surgical candidate, and failing to respond to multiple in   Spondylolisthesis 03/20/2019   Spondylosis of lumbosacral region without myelopathy or radiculopathy 01/30/2019   Last Assessment & Plan:  Formatting of this note might be different from the original. See spinal stenosis plan   Tinea unguium 02/03/2022   Type 2 diabetes mellitus (HCC) 09/28/2018   Past Surgical History:  Past Surgical History:  Procedure Laterality Date   ABDOMINAL HYSTERECTOMY     BACK SURGERY     BIOPSY  06/18/2022   Procedure: BIOPSY;  Surgeon: Lemar Lofty., MD;  Location: [facility] ENDOSCOPY;  Service: Gastroenterology;;   ESOPHAGOGASTRODUODENOSCOPY (EGD) WITH PROPOFOL N/A 06/18/2022   Procedure: ESOPHAGOGASTRODUODENOSCOPY (EGD) WITH PROPOFOL;  Surgeon: Lemar Lofty., MD;  Location: [facility] ENDOSCOPY;  Service: Gastroenterology;  Laterality: N/A;   EYE SURGERY     bilateral cataracts   IR ANGIOGRAM PULMONARY BILATERAL SELECTIVE  11/21/2021   IR ANGIOGRAM SELECTIVE EACH ADDITIONAL VESSEL  11/21/2021   IR ANGIOGRAM SELECTIVE EACH ADDITIONAL VESSEL  11/21/2021   IR THROMBECT PRIM MECH INIT (INCLU) MOD SED  11/21/2021   IR US GUIDE VASC ACCESS RIGHT  11/21/2021   JOINT REPLACEMENT Bilateral    knees   RADIOLOGY WITH ANESTHESIA N/A 11/21/2021   Procedure: IR WITH ANESTHESIA;  Surgeon: Radiologist,  Medication, MD;  Location: [facility] OR;  Service: Radiology;  Laterality: N/A;   SAVORY DILATION N/A 06/18/2022   Procedure: SAVORY DILATION;  Surgeon: Meridee Score Netty Starring., MD;  Location: [facility] ENDOSCOPY;  Service: Gastroenterology;  Laterality: N/A;   SPINAL CORD STIMULATOR INSERTION N/A 07/07/2019   Procedure: LUMBAR SPINAL CORD STIMULATOR INSERTION;  Surgeon: Odette Fraction, MD;  Location: [facility] OR;  Service: Neurosurgery;  Laterality: N/A;   total left knee     HPI:  Pt is a 77 yo female presenting with generalized weakness and poor oral intake. Per MD H&P, husband reports 2-3 weeks of trouble swallowing, described as food feeling stuck in her throat (consistent with previous stricture), regurgitation with inability to keep food down, and coughing "sometimes" with thin liquids. Pt had swallow studies at [facility] the week PTA . Full report not available but per husband, she could swallow limited amounts on MBS fine, but after the applesauce she started to regurgitate. He said the SLP described it as a "clogged drain" through which limited amounts were passing, but this was not seen on esopahgram later that week. Pt was previously seen by SLP at [facility] in March 2024 with clinical presentation improved after esophageal dilation; no s/s of aspiration. PMH includes: esophageal stricture s/p dilation procedures, prior saddle embolus, afib, HFpEF, DMII, CAD, chronic low  back pain s/p spinal cord stimulator    Assessment / Plan / Recommendation  Clinical Impression  Pt's oropharyngeal swallow appears to be [facility] with thin liquid intake. More solid consistencies were deferred as per pt/husband report, even purees result in regurgitation at this time. They share that thin liquids can even be regurgitated over the last few weeks, but she manages this well by taking small, single sips and taking frequent breaks. Given her esophageal hx, her symptoms with solids > liquids, and her regurgitation, recommend considering  GI involvement. Given that MBS was reportedly functional from a pharyngeal standpoint (sounds like her problem was with regurgitation from the esophagus on this test per husband report), not sure this test needs to be repeated especially within such close proximity. However, SLP will continue to follow acutely pending GI consult and will assist as able.  SLP Visit Diagnosis: Dysphagia, unspecified (R13.10)    Aspiration Risk  Risk for inadequate nutrition/hydration;Mild aspiration risk;Moderate aspiration risk    Diet Recommendation Thin liquid;Other (Comment) (liquid diet)    Liquid Administration via: Cup;Straw Medication Administration: Crushed with puree Supervision: Patient able to self feed;Intermittent supervision to cue for compensatory strategies Compensations: Slow rate;Small sips/bites Postural Changes: Seated upright at 90 degrees;Remain upright for at least 30 minutes after po intake    Other  Recommendations Recommended Consults: Consider GI evaluation Oral Care Recommendations: Oral care BID    Recommendations for follow up therapy are one component of a multi-disciplinary discharge planning process, led by the attending physician.  Recommendations may be updated based on patient status, additional functional criteria and insurance authorization.  Follow up Recommendations  (tba)      Assistance Recommended at Discharge    Functional Status Assessment Patient has had a recent decline in their functional status and demonstrates the ability to make significant improvements in function in a reasonable and predictable amount of time.  Frequency and Duration min 2x/week  2 weeks       Prognosis Prognosis for improved oropharyngeal function: Good      Swallow Study   General HPI: Pt is a 77 yo female presenting with generalized weakness and poor oral intake. Per MD H&P, husband reports 2-3 weeks of trouble swallowing, described as food feeling stuck in her throat  (consistent with previous stricture), regurgitation with inability to keep food down, and coughing "sometimes" with thin liquids. Pt had swallow studies at [facility] the week PTA . Full report not available but per husband, she could swallow limited amounts on MBS fine, but after the applesauce she started to regurgitate. He said the SLP described it as a "clogged drain" through which limited amounts were passing, but this was not seen on esopahgram later that week. Pt was previously seen by SLP at [facility] in March 2024 with clinical presentation improved after esophageal dilation; no s/s of aspiration. PMH includes: esophageal stricture s/p dilation procedures, prior saddle embolus, afib, HFpEF, DMII, CAD, chronic low back pain s/p spinal cord stimulator Type of Study: Bedside Swallow Evaluation Previous Swallow Assessment: see HPI Diet Prior to this Study: Thin liquids (Level 0);Clear liquid diet Temperature Spikes Noted: No Respiratory Status: Room air History of Recent Intubation: No Behavior/Cognition: Alert;Cooperative;Pleasant mood Oral Cavity Assessment: Within Functional Limits Oral Care Completed by SLP: No Oral Cavity - Dentition: Dentures, top;Dentures, bottom Vision: Functional for self-feeding Self-Feeding Abilities: Able to feed self Patient Positioning: Upright in bed Baseline Vocal Quality: Normal Volitional Cough: Strong;Congested Volitional Swallow: Able to elicit    Oral/Motor/Sensory Function  Overall Oral Motor/Sensory Function: Within functional limits   Ice Chips Ice chips: Not tested   Thin Liquid Thin Liquid: Within functional limits Presentation: Cup;Self Fed    Nectar Thick Nectar Thick Liquid: Not tested   Honey Thick Honey Thick Liquid: Not tested   Puree Puree: Not tested   Solid     Solid: Not tested      Mahala Menghini., M.A. CCC-SLP Acute Rehabilitation Services Office [phone]  Secure chat preferred  03/23/2023,11:53 AM

## 2023-03-23 NOTE — TOC Progression Note (Signed)
Transition of Care [facility]) - Progression Note    Patient Details  Name: Lisa Maxwell MRN: [id number] Date of Birth: 1946/03/31  Transition of Care [facility]) CM/SW Contact  Michaela Corner, [place] Phone Number: 03/23/2023, 4:33 PM  Clinical Narrative:   CSW met pt at bedside to discuss PT recs for SNF. Pt states she wants to go to [facility] and Rehab.     Expected Discharge Plan: Skilled Nursing Facility Barriers to Discharge: Continued Medical Work up  Expected Discharge Plan and Services In-house Referral: Clinical Social Work                                             Social Determinants of Health (SDOH) Interventions SDOH Screenings   Food Insecurity: No Food Insecurity (03/22/2023)  Housing: Low Risk  (03/22/2023)  Transportation Needs: No Transportation Needs (03/22/2023)  Utilities: Not At Risk (03/22/2023)  Alcohol Screen: Low Risk  (07/31/2022)  Depression (PHQ2-9): Low Risk  (07/31/2022)  Financial Resource Strain: Low Risk  (07/31/2022)  Physical Activity: Unknown (07/31/2022)  Tobacco Use: High Risk (03/22/2023)    Readmission Risk Interventions     No data to display

## 2023-03-23 NOTE — Plan of Care (Signed)
  Problem: Education: Goal: Knowledge of General Education information will improve Description: Including pain rating scale, medication(s)/side effects and non-pharmacologic comfort measures Outcome: Progressing   Problem: Coping: Goal: Level of anxiety will decrease Outcome: Progressing   Problem: Pain Management: Goal: General experience of comfort will improve Outcome: Progressing

## 2023-03-23 NOTE — Progress Notes (Addendum)
HD#1 SUBJECTIVE:  Patient Summary: Lisa Maxwell is a 77 y.o. female with a pertinent PMH of esophageal strictures s/p dilation procedures, prior saddle embolus (11/2021), atrial fibrillation on Eliquis, HFpEF, type 2 diabetes, CAD, and chronic low back pain s/p spinal cord stimulation insertion who presents with concerns for generalized weakness and poor oral intake and is admitted for concerns of aspiration pneumonia and hypovolemic hyponatremia secondary to esophageal dysphagia.   Overnight Events: None  Interim History: She is feeling a little better today. She is sitting up and more awake than yesterday. States she has been drinking fluids with only a little coughing. No new concerns.   OBJECTIVE:  Vital Signs: Vitals:   03/23/23 0600 03/23/23 0715 03/23/23 0900 03/23/23 1019  BP: 95/61 101/60 103/65 (!) 101/58  Pulse:  71 75   Resp: 15 17 16 17   Temp:  98.7 F (37.1 C)  (!) 97.5 F (36.4 C)  TempSrc:  Oral  Oral  SpO2:  97% 95% 100%  Weight:      Height:       Supplemental O2: Room Air SpO2: 100 %  Filed Weights   03/23/23 0349  Weight: 65.3 kg    Intake/Output Summary (Last 24 hours) at 03/23/2023 1212 Last data filed at 03/23/2023 1131 Gross per 24 hour  Intake 84.27 ml  Output 1000 ml  Net -915.73 ml   Net IO Since Admission: -915.73 mL [03/23/23 1212]  Physical Exam: General: laying in bed, in no acute distress Cardiac: Afib, normal rate; no LE edema, pulses in tact Pulmonary: diffuse rhonchi, greater on the right than left, normal rate of breathing, satting fine on room air Skin: stage 1 decubitus ulcer, no change in skin from prior exam Neuro: No focal deficits noted, alert and oriented Psych: Normal mood and affect  Patient Lines/Drains/Airways Status     Active Line/Drains/Airways     Name Placement date Placement time Site Days   Peripheral IV 03/22/23 20 G Right Antecubital 03/22/23  1522  Antecubital  1   Peripheral IV 03/22/23 20 G  Anterior;Left;Proximal Forearm 03/22/23  1755  Forearm  1   Urethral Catheter Gerardo A, RN Double-lumen 14 Fr. 12/01/22  1320  Double-lumen  112   Pressure Injury 11/21/21 Buttocks Left;Medial Stage 2 -  Partial thickness loss of dermis presenting as a shallow open injury with a red, pink wound bed without slough. 11/21/21  1112  -- 487           Pertinent Labs:    Latest Ref Rng & Units 03/23/2023    7:38 AM 03/22/2023    3:13 PM 12/04/2022    5:43 AM  CBC  WBC 4.0 - 10.5 K/uL 9.0  12.2  5.6   Hemoglobin 12.0 - 15.0 g/dL 9.3  74.2  59.5   Hematocrit 36.0 - 46.0 % 27.9  31.4  39.8   Platelets 150 - 400 K/uL 351  333  187       Latest Ref Rng & Units 03/23/2023    4:15 AM 03/22/2023   11:45 PM 03/22/2023    3:13 PM  CMP  Glucose 70 - 99 mg/dL 638  756  95   BUN 8 - 23 mg/dL 46  53  63   Creatinine 0.44 - 1.00 mg/dL 4.33  2.95  1.88   Sodium 135 - 145 mmol/L 127  125  125   Potassium 3.5 - 5.1 mmol/L 4.5  4.1  4.6   Chloride 98 - 111  mmol/L 94  93  91   CO2 22 - 32 mmol/L 24  27  23    Calcium 8.9 - 10.3 mg/dL 86.5  9.8  78.4     No results for input(s): "GLUCAP" in the last 72 hours.   Pertinent Imaging: DG Chest Port 1 View Result Date: 03/22/2023 CLINICAL DATA:  Weakness EXAM: PORTABLE CHEST 1 VIEW COMPARISON:  12/05/2022 FINDINGS: Ascending thoracic stimulator leads with similar positioning. Cardiac enlargement with mild central congestion. No pleural effusion. No pneumothorax. Ovoid right upper lung opacity. IMPRESSION: 1. Cardiac enlargement with mild central congestion. 2. Ovoid right upper lung opacity, this could represent acute pulmonary infiltrate with lung nodule not excluded. Short interval radiographic follow-up is recommended. Electronically Signed   By: Jasmine Pang M.D.   On: 03/22/2023 16:26    ASSESSMENT/PLAN:  Assessment: Principal Problem:   Esophageal dysphagia Active Problems:   COPD (chronic obstructive pulmonary disease) (HCC)   Decubitus skin  ulcer   Aspiration pneumonia (HCC)   Hyponatremia   Chronic CHF (congestive heart failure) (HCC)   Atrial fibrillation with RVR (HCC)   GI bleeding  Lisa Maxwell is a 77 y.o. person living with a history of esophageal strictures s/p dilation procedures, prior saddle embolus (11/2021), atrial fibrillation on Eliquis, HFpEF, type 2 diabetes, CAD, and chronic low back pain s/p spinal cord stimulation insertion who presents with concerns for generalized weakness and poor oral intake and is admitted for aspiration pneumonia on hospital day 1.  Plan:  Aspiration pneumonia 2/2 esophageal dysphagia History of esophageal strictures s/p dilation Continues to experience symptoms consistent with esophageal dysphagia. On a liquid diet, able to swallow fluids but occasionally coughs following drinking. Denies any shortness of breath. Speech evaluated her and recommended a GI consult for further workup of esophageal dysphagia.  - GI consult for evaluation of esophageal dysphagia - start Unasyn; discontinue Zosyn - Follow up blood cultures - Clear liquid diet   Hyponatremia, improving Sodium 127, up from 125 yesterday. Suspect this is secondary to poor solute intake given improvement with IV fluids.  - 500cc bolus LR - Recheck BMP this afternoon   Atrial fibrillation, on anticoagulation HFpEF (EF 55-60% in 11/2022) Still in atrial fibrillation this morning. Blood pressure stable. On Eliquis 5 mg BID. For her heart failure, she is taking spironolactone 25 mg daily and Lasix 40 mg BID. - Continuing to hold Eliquis pending GI eval - Holding spironolactone, lasix given hypotension   Dehydration in the setting of poor solute intake Hypotension, improved We will continue repleting her volume in the setting of her heart failure with preserved ejection fraction. She is drinking fluids currently. BP is stable.  - 500 mL bolus LR today - Clear liquid diet - Monitor BP closely   GI bleed Normocytic  anemia History of posterior anal fissure and internal hemorrhoids Hemoglobin 10.4 on admission, down to 9.3. Suspect hemodilution given fluids. Will continue to monitor.  - recheck CBC tomorrow AM   Chronic conditions: COPD, lung opacity in RUL: on room air at home; uses breo ellipta and albuterol inhalers at home, restarted. Consider lung CT scan outpatient for further characterization.  Chronic back pain s/p spinal cord stimulator: taking oxycodone 10 mg QID as needed, robaxin 500 mg TID, reordered. Takes gabapentin 600 mg at bedtime, held.  Type 2 diabetes mellitus: on Rybelsus 3 mg daily, held. A1c 6.1 nine months ago. CAD/HLD: on lovastatin 40 mg daily, reordered. Takes nitro 0.4 mg prn at home, held. GERD: takes protonix  40 mg daily at home, reordered.  Intertrigo: has fungal rash under breasts and inguinal areas, uses nystatin cream at home, reordered.   Diet: Clears VTE: Enoxaparin IVF: LR, 500cc bolus Code: Full  Best Practice: Diet: Clear liquid diet IVF: Fluids: LR, Rate:  500 cc bolus VTE: SCDs Start: 03/22/23 1819 Code: Full Therapy Recs: Pending DISPO: Anticipated discharge in 2-4 days to Skilled nursing facility pending medical workup, treatment, PT/OT eval.  Signature: Annett Fabian, MD  Internal Medicine Resident, PGY-1 [facility]  Pager: [phone] 12:12 PM, 03/23/2023   Please contact the on call pager after 5 pm and on weekends at [phone].

## 2023-03-23 NOTE — H&P (View-Only) (Signed)
Consultation Note   Referring Provider:  Internal Medicine Teaching Service PCP: Paulina Fusi, MD Primary Gastroenterologist: Lynann Bologna, MD        Reason for Consultation: Dysphagia  DOA: 03/22/2023         Hospital Day: 2   ASSESSMENT    Brief Narrative:  77 y.o. year old female with a history of COPD, chronic CHF, PE, DM, AFIB with RVR, CKD ,esophageal dysmotility, GERD, history of Schatzki's ring s/p dilation March 2024.   Recurrent solid food dysphagia. History of non-obstructing Schatzki's ring s/p dilation in March 2024. Recurrent solid dysphagia possible secondary to worsening esophageal dysmotility. Candida esophagitis possible but less likely. Also suspect pharyngeal dysphagia with possible aspiration.  She had a very recent esophogram at [facility]. Results not in Epic but I contacted PCP and had RN read report of esophagram which ,other than some tertiary contractions was unremarkable. MBSS seem to suggest pharyngeal dysphagia . No overt aspiration but views were limited.  I have requested reports be faxed over for further details.    AFIB, on Eliquis.  Last dose, possibly 12/16 prior to admission.   [place] anemia / FOBT+ .  Hgb 9.3 - 10.4. Baseline hgb around 12.   Aspiration pneumonia On antibiotics. Has productive cough. No SHOB  Elevated lipase - 376 No abdominal pain.    Principal Problem:   Esophageal dysphagia Active Problems:   COPD (chronic obstructive pulmonary disease) (HCC)   Decubitus skin ulcer   Aspiration pneumonia (HCC)   Hyponatremia   Chronic CHF (congestive heart failure) (HCC)   Atrial fibrillation with RVR (HCC)   GI bleeding     PLAN:   -Will proceed with EGD tomorrow though may be low yield.  The risks and benefits of EGD with possible biopsies were discussed with the patient who agrees to proceed.  --Continue Pantoprazole for history of GERD --Check iron studies, B12,  folate --Repeat lipase in am. Consider abdominal imaging.  --Due to suspected oropharyngeal dysphagia she doesn't need to have thin liquids for now. Also, husband describes acute onset of dysphagia does she need head CT ?    HPI   Lisa Maxwell presented to ED yesterday with weakness and decreased PO intake 2/2 to solid food dysphagia.  She has known esophageal dysmotility as well as a nonobstructing Schatzki's ring which was dilated in March 2024. Post dilation she had complete resolution of dysphagia and continued to do well until 2-3 weeks when she suddenly developed recurrent solid food dysphagia.  She has had multiple antibiotics in the recent past months.  No odynophagia.  Sounds like she saw her PCP for dysphagia and was sent for barium swallow at [facility].  Results are not available.    Notable labs / Imaging / Events this admission  :  WBC 12.2 Hgb 10.4 MCV 87 Na 125 >> 127 today BUN 53 Cr 1.2 Lipase 376 CXR >>  Cardiac enlargement with mild central congestion. Ovoid right upper lung opacity, this could represent acute pulmonary infiltrate with lung nodule not excluded. Short interval radiographic follow-up is recommended.  Previous GI Evaluations   EGD 06/18/22 - No gross lesions in the entire esophagus. - Non-obstructing Schatzki ring? disrupted. - Z-line regular, 35 cm  from the incisors. - Savory dilation performed of the esophagus with mucosal rent just below the UES noted after 16 mm dilation. - 2 cm hiatal hernia. - Erythematous mucosa in the stomach. Biopsied. - No gross lesions in the duodenal bulb, in the first portion of the duodenum and in the second portion of the duodenum. Biopsied.  Barium swallow 06/10/22 IMPRESSION: 1. Narrowing of the distal esophagus which does not allow passage of the 13mm barium tablet.  2.  Tertiary contractions leading to moderate dysmotility.   Colonoscopy history is unknown  Labs and Imaging: Recent Labs    03/22/23 1513  03/23/23 0738  WBC 12.2* 9.0  HGB 10.4* 9.3*  HCT 31.4* 27.9*  PLT 333 351   Recent Labs    03/22/23 1513 03/22/23 2345 03/23/23 0415  NA 125* 125* 127*  K 4.6 4.1 4.5  CL 91* 93* 94*  CO2 23 27 24   GLUCOSE 95 126* 106*  BUN 63* 53* 46*  CREATININE 1.18* 1.20* 1.20*  CALCIUM 10.3 9.8 10.1   No results for input(s): "PROT", "ALBUMIN", "AST", "ALT", "ALKPHOS", "BILITOT", "BILIDIR", "IBILI" in the last 72 hours. No results for input(s): "HEPBSAG", "HCVAB", "HEPAIGM", "HEPBIGM" in the last 72 hours. Recent Labs    03/22/23 1513  LABPROT 23.0*  INR 2.0*      Past Medical History:  Diagnosis Date   CAD (coronary artery disease) 11/04/2016   Cancer (HCC)    skin cancer   Cauda equina compression (HCC) 08/28/2021   Cerebrovascular disease    Chronic atrial fibrillation (HCC) 09/28/2018   Chronic bilateral low back pain without sciatica 03/08/2018   Chronic kidney disease    Chronic obstructive lung disease (HCC) 09/28/2018   Chronic pain syndrome 03/08/2018   Chronic renal failure 09/28/2018   Chronic, continuous use of opioids 03/04/2021   Last Assessment & Plan:  Formatting of this note might be different from the original. Patient and I have discussed the hazardous effects of continued opiate pain medication usage. Risks and benefits of above medications including but not limited to possibility of respiratory depression, sedation, and even death were discussed with the patient who expressed an understanding.  Patient did not displ   CKD (chronic kidney disease) stage 3, GFR 30-59 ml/min (HCC)    Class 1 obesity due to excess calories with serious comorbidity and body mass index (BMI) of 34.0 to 34.9 in adult 05/20/2021   Last Assessment & Plan: Formatting of this note might be different from the original. Patient educated about the detrimental effects of weight as it specifically pertains to pain management and overall health.  Patient's BMI 34.08 Encouraged healthy eating  habits and routine low-impact cardiovascular exercises as tolerated.   Comprehensive diabetic foot examination, type 2 DM, encounter for (HCC) 04/29/2021   DDD (degenerative disc disease), lumbar 11/17/2018   Last Assessment & Plan:  Formatting of this note might be different from the original. See spinal stenosis plan   Diabetes (HCC)    Diabetes mellitus due to underlying condition with unspecified complications (HCC) 11/04/2016   Dyshidrotic eczema 04/29/2021   Dysrhythmia    afib   Essential hypertension 11/04/2016   Facet arthritis of lumbar region 03/08/2018   Facet joint disease 11/17/2018   Last Assessment & Plan:  Formatting of this note might be different from the original. See spinal stenosis plan   Foraminal stenosis of lumbar region 03/08/2018   Heart murmur    History of knee replacement, total, bilateral 11/17/2018   Last  Assessment & Plan:  Formatting of this note might be different from the original. Have recommended to her several times if any further concerns in regards to the pain status post arthroplasty, she is to follow up with her surgeon.   History of pulmonary embolism 11/28/2021   Hypertension    Hypertensive disorder 09/28/2018   Increased body mass index 09/28/2018   Lumbar radiculopathy 03/08/2018   Lumbar spondylosis 03/20/2019   Mixed dyslipidemia 11/04/2016   Morbid obesity (HCC) 11/04/2016   Muscle pain 04/29/2020   Osteoarthritis of left knee    PAF (paroxysmal atrial fibrillation) (HCC) 04/30/2015   Pain management contract agreement 04/13/2019   Last Assessment & Plan:  Formatting of this note might be different from the original. Contract updated today.  UDS completed.  [place] controlled substance registry reviewed and is consistent with her regimen.   Peripheral vascular disease (HCC) 09/28/2018   Preop cardiovascular exam 05/01/2019   Pressure injury of skin 11/21/2021   Pulmonary emboli [facility])    Pulmonary embolism (HCC) 11/21/2021    Renal insufficiency 04/30/2015   S/P insertion of spinal cord stimulator 07/13/2019   Last Assessment & Plan:  Formatting of this note might be different from the original. About 5 months status post insertion of spinal cord stimulator with excellent relief.  She continues to keep close follow-up with her representative and has been working to appropriately find best programs to get her adequate pain relief.   Sleep apnea    Smoker 09/28/2018   Spinal stenosis of lumbar region 03/20/2019   Spinal stenosis of lumbar region with neurogenic claudication 03/08/2018   Last Assessment & Plan:  Formatting of this note is different from the original. 77 year old female with chronic low back pain and bilateral, right greater than left, L4-5 radicular pain, and claudication. She has substantial amount of multifactorial degenerative thoracic and lumbar spine pathology.  After being deemed an inappropriate open surgical candidate, and failing to respond to multiple in   Spondylolisthesis 03/20/2019   Spondylosis of lumbosacral region without myelopathy or radiculopathy 01/30/2019   Last Assessment & Plan:  Formatting of this note might be different from the original. See spinal stenosis plan   Tinea unguium 02/03/2022   Type 2 diabetes mellitus (HCC) 09/28/2018    Past Surgical History:  Procedure Laterality Date   ABDOMINAL HYSTERECTOMY     BACK SURGERY     BIOPSY  06/18/2022   Procedure: BIOPSY;  Surgeon: Lemar Lofty., MD;  Location: [facility] ENDOSCOPY;  Service: Gastroenterology;;   ESOPHAGOGASTRODUODENOSCOPY (EGD) WITH PROPOFOL N/A 06/18/2022   Procedure: ESOPHAGOGASTRODUODENOSCOPY (EGD) WITH PROPOFOL;  Surgeon: Lemar Lofty., MD;  Location: [facility] ENDOSCOPY;  Service: Gastroenterology;  Laterality: N/A;   EYE SURGERY     bilateral cataracts   IR ANGIOGRAM PULMONARY BILATERAL SELECTIVE  11/21/2021   IR ANGIOGRAM SELECTIVE EACH ADDITIONAL VESSEL  11/21/2021   IR ANGIOGRAM SELECTIVE EACH  ADDITIONAL VESSEL  11/21/2021   IR THROMBECT PRIM MECH INIT (INCLU) MOD SED  11/21/2021   IR US GUIDE VASC ACCESS RIGHT  11/21/2021   JOINT REPLACEMENT Bilateral    knees   RADIOLOGY WITH ANESTHESIA N/A 11/21/2021   Procedure: IR WITH ANESTHESIA;  Surgeon: Radiologist, Medication, MD;  Location: [facility] OR;  Service: Radiology;  Laterality: N/A;   SAVORY DILATION N/A 06/18/2022   Procedure: SAVORY DILATION;  Surgeon: Meridee Score Netty Starring., MD;  Location: [facility] ENDOSCOPY;  Service: Gastroenterology;  Laterality: N/A;   SPINAL CORD STIMULATOR INSERTION N/A 07/07/2019   Procedure:  LUMBAR SPINAL CORD STIMULATOR INSERTION;  Surgeon: Odette Fraction, MD;  [facility] OR;  Service: Neurosurgery;  Laterality: N/A;   total left knee      Family History  Problem Relation Age of Onset   Diabetes Mother     Prior to Admission medications   Medication Sig Start Date End Date Taking? Authorizing Provider  apixaban (ELIQUIS) 5 MG TABS tablet TAKE 1 TABLET BY MOUTH TWICE A DAY 12/30/22  Yes Revankar, Aundra Dubin, MD  bethanechol (URECHOLINE) 50 MG tablet Take 50 mg by mouth 3 (three) times daily. 07/23/22  Yes [provider]  Coenzyme Q10 (CO Q-10) 200 MG CAPS Take 200 mg by mouth in the morning.   Yes [provider]  Cyanocobalamin (VITAMIN B-12) 500 MCG SUBL Place 500 mcg under the tongue in the morning.   Yes [provider]  docusate sodium (COLACE) 100 MG capsule Take 100 mg by mouth 2 (two) times daily as needed for mild constipation or moderate constipation. 10/08/21  Yes [provider]  fluticasone furoate-vilanterol (BREO ELLIPTA) 100-25 MCG/ACT AEPB Inhale 1-2 puffs into the lungs daily.   Yes [provider]  furosemide (LASIX) 40 MG tablet Take 1 tablet (40 mg total) by mouth 2 (two) times daily. 12/07/22  Yes Rhetta Mura, MD  gabapentin (NEURONTIN) 600 MG tablet Take 1 tablet (600 mg total) by mouth at bedtime. 12/07/22  Yes Rhetta Mura, MD  Iron,  Ferrous Sulfate, 325 (65 Fe) MG TABS Take 65 mg of iron by mouth daily with breakfast.   Yes [provider]  KLOR-CON M20 20 MEQ tablet Take 20 mEq by mouth at bedtime. 01/11/23  Yes [provider]  latanoprost (XALATAN) 0.005 % ophthalmic solution Place 1 drop into both eyes at bedtime. 11/17/21  Yes [provider]  lovastatin (MEVACOR) 40 MG tablet Take 40 mg by mouth at bedtime.    Yes [provider]  methocarbamol (ROBAXIN) 500 MG tablet Take 0.5 tablets (250 mg total) by mouth 3 (three) times daily. Patient taking differently: Take 250 mg by mouth 4 (four) times daily. 12/07/22  Yes Rhetta Mura, MD  Multiple Vitamins-Minerals (CENTRUM SILVER 50+WOMEN) TABS Take 1 tablet by mouth daily with breakfast.   Yes [provider]  nystatin cream (MYCOSTATIN) Apply 1 Application topically 2 (two) times daily. 10/14/22  Yes [provider]  Oxycodone HCl 10 MG TABS Take 10 mg by mouth 4 (four) times daily as needed.   Yes [provider]  pantoprazole (PROTONIX) 40 MG tablet Take 1 tablet (40 mg total) by mouth daily. Patient taking differently: Take 40 mg by mouth in the morning. 09/02/22  Yes Lynann Bologna, MD  RYBELSUS 3 MG TABS Take 3 mg by mouth daily before breakfast. 05/12/21  Yes [provider]  spironolactone (ALDACTONE) 25 MG tablet Take 25 mg by mouth every morning. 01/04/23  Yes [provider]  tamsulosin (FLOMAX) 0.4 MG CAPS capsule Take 0.4 mg by mouth 2 (two) times daily. 07/23/22  Yes [provider]  triamcinolone ointment (KENALOG) 0.5 % Apply 1 Application topically at bedtime.   Yes [provider]  Turmeric (QC TUMERIC COMPLEX PO) Take 1 tablet by mouth at bedtime.   Yes [provider]  albuterol (VENTOLIN HFA) 108 (90 Base) MCG/ACT inhaler Inhale into the lungs every 6 (six) hours as needed for wheezing or shortness of breath. Patient not taking: Reported on 03/22/2023     [provider]  lidocaine (LIDODERM) 5 %  Place 1 patch onto the skin daily. Remove & Discard patch within 12 hours or as directed by MD Patient not taking: Reported on 03/22/2023 12/07/22   Rhetta Mura, MD  naloxone [facility]) nasal spray 4 mg/0.1 mL Place 1 spray into the nose daily as needed (accidental overdose). Patient not taking: Reported on 03/22/2023    [provider]  nitroGLYCERIN (NITROSTAT) 0.4 MG SL tablet Place 0.4 mg under the tongue every 5 (five) minutes as needed for chest pain. Patient not taking: Reported on 03/22/2023    [provider]    Current Facility-Administered Medications  Medication Dose Route Frequency Provider Last Rate Last Admin   Ampicillin-Sulbactam (UNASYN) 3 g in sodium chloride 0.9 % 100 mL IVPB  3 g Intravenous Q12H Daylene Posey, RPH       Chlorhexidine Gluconate Cloth 2 % PADS 6 each  6 each Topical Daily Pyrtle, Carie Caddy, MD   6 each at 03/23/23 1412   fluticasone furoate-vilanterol (BREO ELLIPTA) 100-25 MCG/ACT 1-2 puff  1-2 puff Inhalation Daily Marrianne Mood, MD       methocarbamol (ROBAXIN) tablet 250 mg  250 mg Oral TID Marrianne Mood, MD   250 mg at 03/23/23 0932   nystatin cream (MYCOSTATIN) 1 Application  1 Application Topical BID Marrianne Mood, MD   1 Application at 03/23/23 0932   oxyCODONE (Oxy IR/ROXICODONE) immediate release tablet 10 mg  10 mg Oral QID PRN Reymundo Poll, MD   10 mg at 03/23/23 1412   pantoprazole (PROTONIX) EC tablet 40 mg  40 mg Oral Daily Marrianne Mood, MD   40 mg at 03/23/23 0932   pravastatin (PRAVACHOL) tablet 40 mg  40 mg Oral q1800 Marrianne Mood, MD       sodium chloride flush (NS) 0.9 % injection 3 mL  3 mL Intravenous PRN Marrianne Mood, MD        Allergies as of 03/22/2023   (No Known Allergies)    Social History   Socioeconomic History   Marital status: Married    Spouse name: Not on file   Number of children: 2   Years of education: Not on  file   Highest education level: Not on file  Occupational History   Not on file  Tobacco Use   Smoking status: Some Days    Current packs/day: 0.00    Average packs/day: 0.5 packs/day for 62.0 years (31.0 ttl pk-yrs)    Types: Cigarettes    Start date: 05/07/1957    Last attempt to quit: 05/08/2019    Years since quitting: 3.8    Passive exposure: Past   Smokeless tobacco: Never  Vaping Use   Vaping status: Never Used  Substance and Sexual Activity   Alcohol use: No   Drug use: No   Sexual activity: Not Currently  Other Topics Concern   Not on file  Social History Narrative   Not on file   Social Drivers of Health   Financial Resource Strain: Low Risk  (07/31/2022)   Overall Financial Resource Strain (CARDIA)    Difficulty of Paying Living Expenses: Not hard at all  Food Insecurity: No Food Insecurity (03/22/2023)   Hunger Vital Sign    Worried About Running Out of Food in the Last Year: Never true    Ran Out of Food in the Last Year: Never true  Transportation Needs: No Transportation Needs (03/22/2023)   PRAPARE - Administrator, Civil Service (Medical): No    Lack of Transportation (Non-Medical):  No  Physical Activity: Unknown (07/31/2022)   Exercise Vital Sign    Days of Exercise per Week: Not on file    Minutes of Exercise per Session: 0 min  Stress: Not on file  Social Connections: Not on file  Intimate Partner Violence: Not At Risk (03/22/2023)   Humiliation, Afraid, Rape, and Kick questionnaire    Fear of Current or Ex-Partner: No    Emotionally Abused: No    Physically Abused: No    Sexually Abused: No     Code Status   Code Status: Full Code  Review of Systems: All systems reviewed and negative except where noted in HPI.  Physical Exam: Vital signs in last 24 hours: Temp:  [96.7 F (35.9 C)-98.7 F (37.1 C)] 97.9 F (36.6 C) (12/17 1332) Pulse Rate:  [54-96] 96 (12/17 1332) Resp:  [13-31] 18 (12/17 1332) BP: (80-126)/(43-81) 104/43  (12/17 1332) SpO2:  [90 %-100 %] 100 % (12/17 1332) Weight:  [54 kg-65.3 kg] 54 kg (12/17 1331)    General:  Pleasant female in NAD Psych:  Cooperative. Normal mood and affect Eyes: Pupils equal Ears:  Normal auditory acuity Nose: No deformity, discharge or lesions Neck:  Supple, no masses felt Lungs:  Bilateral rhonchi, decreased breath sounds. .  Heart:  Regular rate.  Abdomen:  Soft, nondistended, nontender, active bowel sounds, no masses felt Rectal :  Deferred Msk: Symmetrical without gross deformities.  Neurologic:  Alert, oriented, grossly normal neurologically Extremities : No edema Skin:  Intact without significant lesions.    Intake/Output from previous day: 12/16 0701 - 12/17 0700 In: -  Out: 1000 [Urine:1000] Intake/Output this shift:  Total I/O In: 84.3 [IV Piggyback:84.3] Out: 750 [Urine:750]   Willette Cluster, NP-C   03/23/2023, 2:34 PM

## 2023-03-24 ENCOUNTER — Inpatient Hospital Stay (HOSPITAL_COMMUNITY): Payer: Medicare HMO | Admitting: Registered Nurse

## 2023-03-24 ENCOUNTER — Encounter (HOSPITAL_COMMUNITY)
Admission: EM | Disposition: A | Discharge status: Skilled Nursing Facility | Payer: Self-pay | Source: Skilled Nursing Facility | Attending: Internal Medicine

## 2023-03-24 ENCOUNTER — Encounter (HOSPITAL_COMMUNITY): Payer: Self-pay | Admitting: Internal Medicine

## 2023-03-24 ENCOUNTER — Inpatient Hospital Stay (HOSPITAL_COMMUNITY): Payer: Medicare HMO

## 2023-03-24 DIAGNOSIS — K222 Esophageal obstruction: Secondary | ICD-10-CM

## 2023-03-24 DIAGNOSIS — E871 Hypo-osmolality and hyponatremia: Secondary | ICD-10-CM | POA: Diagnosis not present

## 2023-03-24 DIAGNOSIS — I4891 Unspecified atrial fibrillation: Secondary | ICD-10-CM | POA: Diagnosis not present

## 2023-03-24 DIAGNOSIS — J180 Bronchopneumonia, unspecified organism: Secondary | ICD-10-CM | POA: Diagnosis not present

## 2023-03-24 DIAGNOSIS — R1310 Dysphagia, unspecified: Secondary | ICD-10-CM | POA: Diagnosis not present

## 2023-03-24 DIAGNOSIS — K449 Diaphragmatic hernia without obstruction or gangrene: Secondary | ICD-10-CM | POA: Diagnosis not present

## 2023-03-24 DIAGNOSIS — J690 Pneumonitis due to inhalation of food and vomit: Secondary | ICD-10-CM | POA: Diagnosis not present

## 2023-03-24 HISTORY — PX: ESOPHAGOGASTRODUODENOSCOPY (EGD) WITH PROPOFOL: SHX5813

## 2023-03-24 HISTORY — PX: ESOPHAGEAL DILATION: SHX303

## 2023-03-24 LAB — BASIC METABOLIC PANEL
Anion gap: 9 (ref 5–15)
BUN: 26 mg/dL — ABNORMAL HIGH (ref 8–23)
CO2: 23 mmol/L (ref 22–32)
Calcium: 9.6 mg/dL (ref 8.9–10.3)
Chloride: 97 mmol/L — ABNORMAL LOW (ref 98–111)
Creatinine, Ser: 0.99 mg/dL (ref 0.44–1.00)
GFR, Estimated: 59 mL/min — ABNORMAL LOW (ref >60)
Glucose, Bld: 106 mg/dL — ABNORMAL HIGH (ref 70–99)
Potassium: 3.9 mmol/L (ref 3.5–5.1)
Sodium: 129 mmol/L — ABNORMAL LOW (ref 135–145)

## 2023-03-24 LAB — CBC
HCT: 27.8 % — ABNORMAL LOW (ref 36.0–46.0)
Hemoglobin: 9.2 g/dL — ABNORMAL LOW (ref 12.0–15.0)
MCH: 28.8 pg (ref 26.0–34.0)
MCHC: 33.1 g/dL (ref 30.0–36.0)
MCV: 86.9 fL (ref 80.0–100.0)
Platelets: 320 10*3/uL (ref 150–400)
RBC: 3.2 MIL/uL — ABNORMAL LOW (ref 3.87–5.11)
RDW: 15 % (ref 11.5–15.5)
WBC: 6.6 10*3/uL (ref 4.0–10.5)
nRBC: 0 % (ref 0.0–0.2)

## 2023-03-24 LAB — HEPATIC FUNCTION PANEL
ALT: 20 U/L (ref 0–44)
AST: 29 U/L (ref 15–41)
Albumin: 2.1 g/dL — ABNORMAL LOW (ref 3.5–5.0)
Alkaline Phosphatase: 55 U/L (ref 38–126)
Bilirubin, Direct: <0.1 mg/dL (ref 0.0–0.2)
Total Bilirubin: 0.3 mg/dL (ref <1.2)
Total Protein: 5.3 g/dL — ABNORMAL LOW (ref 6.5–8.1)

## 2023-03-24 LAB — LIPASE, BLOOD: Lipase: 255 U/L — ABNORMAL HIGH (ref 11–51)

## 2023-03-24 LAB — GLUCOSE, CAPILLARY: Glucose-Capillary: 98 mg/dL (ref 70–99)

## 2023-03-24 SURGERY — ESOPHAGOGASTRODUODENOSCOPY (EGD) WITH PROPOFOL
Anesthesia: Monitor Anesthesia Care

## 2023-03-24 MED ORDER — SODIUM CHLORIDE 0.9 % IV SOLN
3.0000 g | Freq: Three times a day (TID) | INTRAVENOUS | Status: DC
  Administered 2023-03-24 – 2023-03-26 (×6): 3 g via INTRAVENOUS
  Filled 2023-03-24 (×7): qty 8

## 2023-03-24 MED ORDER — SODIUM CHLORIDE 0.9 % IV SOLN: INTRAVENOUS | Status: DC | PRN

## 2023-03-24 MED ORDER — PHENYLEPHRINE HCL (PRESSORS) 10 MG/ML IV SOLN
INTRAVENOUS | Status: DC | PRN
  Administered 2023-03-24: 80 ug via INTRAVENOUS

## 2023-03-24 MED ORDER — IOHEXOL 350 MG/ML SOLN
75.0000 mL | Freq: Once | INTRAVENOUS | Status: AC | PRN
  Administered 2023-03-24: 75 mL via INTRAVENOUS

## 2023-03-24 MED ORDER — LIDOCAINE 2% (20 MG/ML) 5 ML SYRINGE
INTRAMUSCULAR | Status: DC | PRN
  Administered 2023-03-24: 60 mg via INTRAVENOUS

## 2023-03-24 MED ORDER — SODIUM CHLORIDE 0.9 % IV SOLN
INTRAVENOUS | Status: AC
Start: 2023-03-24 — End: 2023-03-24

## 2023-03-24 MED ORDER — APIXABAN 5 MG PO TABS: 5.0000 mg | ORAL_TABLET | Freq: Two times a day (BID) | ORAL | Status: DC

## 2023-03-24 MED ORDER — MEDIHONEY WOUND/BURN DRESSING EX PSTE
1.0000 | PASTE | Freq: Every day | CUTANEOUS | Status: DC
  Administered 2023-03-24 – 2023-04-02 (×10): 1 via TOPICAL
  Filled 2023-03-24: qty 44

## 2023-03-24 MED ORDER — PROPOFOL 10 MG/ML IV BOLUS
INTRAVENOUS | Status: DC | PRN
  Administered 2023-03-24: 40 mg via INTRAVENOUS
  Administered 2023-03-24: 100 ug/kg/min via INTRAVENOUS

## 2023-03-24 MED ORDER — APIXABAN 5 MG PO TABS
5.0000 mg | ORAL_TABLET | Freq: Two times a day (BID) | ORAL | Status: DC
  Administered 2023-03-24 – 2023-03-27 (×6): 5 mg via ORAL
  Filled 2023-03-24 (×6): qty 1

## 2023-03-24 MED ORDER — DICLOFENAC SODIUM 1 % EX GEL
4.0000 g | Freq: Four times a day (QID) | CUTANEOUS | Status: DC
  Filled 2023-03-24: qty 100

## 2023-03-24 MED ORDER — LACTATED RINGERS IV SOLN: INTRAVENOUS | Status: DC

## 2023-03-24 SURGICAL SUPPLY — 14 items

## 2023-03-24 NOTE — Interval H&P Note (Signed)
History and Physical Interval Note: For EGD to evaluate oropharyngeal and esophageal dysphagia.  History of savory dilation to 16 mm in March 2024 with good response of swallowing dysfunction Certain component of dysmotility CT negative for CVA HIGHER THAN BASELINE RISK.The nature of the procedure, as well as the risks, benefits, and alternatives were carefully and thoroughly reviewed with the patient. Ample time for discussion and questions allowed. The patient understood, was satisfied, and agreed to proceed.   03/24/2023 9:33 AM  Lisa Maxwell  has presented today for surgery, with the diagnosis of Dysphagia.  The various methods of treatment have been discussed with the patient and family. After consideration of risks, benefits and other options for treatment, the patient has consented to  Procedure(s): ESOPHAGOGASTRODUODENOSCOPY (EGD) WITH PROPOFOL (N/A) as a surgical intervention.  The patient's history has been reviewed, patient examined, no change in status, stable for surgery.  I have reviewed the patient's chart and labs.  Questions were answered to the patient's satisfaction.     Carie Caddy Kivon Aprea

## 2023-03-24 NOTE — Transfer of Care (Signed)
Immediate Anesthesia Transfer of Care Note  Patient: Lisa Maxwell  Procedure(s) Performed: ESOPHAGOGASTRODUODENOSCOPY (EGD) WITH PROPOFOL ESOPHAGEAL DILATION  Patient Location: PACU  Anesthesia Type:MAC  Level of Consciousness: awake, alert , and oriented  Airway & Oxygen Therapy: Patient Spontanous Breathing  Post-op Assessment: Report given to RN and Post -op Vital signs reviewed and stable  Post vital signs: Reviewed and stable  Last Vitals:  Vitals Value Taken Time  BP 99/55   Temp    Pulse 82 03/24/23 1035  Resp 18   SpO2 97 % 03/24/23 1035  Vitals shown include unfiled device data.  Last Pain:  Vitals:   03/24/23 0900  TempSrc: Temporal  PainSc: 3       Patients Stated Pain Goal: 0 (03/24/23 0502)  Complications: There were no known notable events for this encounter.

## 2023-03-24 NOTE — Consult Note (Signed)
WOC Nurse Consult Note: Reason for Consult: Consult requested for bilat feet and thighs.  Left heel with dark purple Deep tissue pressure injury; .5X.5cm, surrounded by red and painful intact skin which blanches Left anterior foot with full thickness wound; dark red-brown and dry .2X.2X.1cm Right heel red and painful intact skin which blanches Right outer ankle with Unstageable pressure injury; 1X1cm, dry dark red-brown Left anterior thigh with full thickness wound of unknown origin; 2X1cm, small amt drainage Right upper thigh with partial thickness wound; 2X2X.1cm, red and moist Pressure Injury POA: Yes Dressing procedure/placement/frequency: Float bilat heels to reduce pressure. Topical treatment orders provided for bedside nurses to perform to assist with removal of nonviable tissue:  1. Apply Medihoney to left thigh wound and right outer ankle and left anterior foot wounds Q day, then cover with foam dressings.  Change foam dressings Q 3 days or PRN soiling 2. Foam dressings to bilat heels and right thigh, change Q 3 days or PRN soiling Please re-consult if further assistance is needed.  Thank-you,  Cammie Mcgee MSN, RN, CWOCN, [place], CNS [phone]

## 2023-03-24 NOTE — Anesthesia Postprocedure Evaluation (Signed)
Anesthesia Post Note  Patient: SHAKIEA RENDELL  Procedure(s) Performed: ESOPHAGOGASTRODUODENOSCOPY (EGD) WITH PROPOFOL ESOPHAGEAL DILATION     Patient location during evaluation: PACU Anesthesia Type: MAC Level of consciousness: awake and alert Pain management: pain level controlled Vital Signs Assessment: post-procedure vital signs reviewed and stable Respiratory status: spontaneous breathing, nonlabored ventilation, respiratory function stable and patient connected to nasal cannula oxygen Cardiovascular status: stable and blood pressure returned to baseline Postop Assessment: no apparent nausea or vomiting Anesthetic complications: no   There were no known notable events for this encounter.  Last Vitals:  Vitals:   03/24/23 1050 03/24/23 1110  BP: 107/77 107/66  Pulse: (!) 114 96  Resp: 19 16  Temp:  36.8 C  SpO2: 98% 99%    Last Pain:  Vitals:   03/24/23 1110  TempSrc: Oral  PainSc:                  Mariann Barter

## 2023-03-24 NOTE — Progress Notes (Signed)
HD#2 SUBJECTIVE:  Patient Summary: Lisa Maxwell is a 77 y.o. female with a pertinent PMH of esophageal strictures, Schatzki's ring s/p dilation, prior saddle embolus (11/2021), atrial fibrillation on Eliquis, HFpEF, type 2 diabetes, CAD, and chronic low back pain s/p spinal cord stimulation insertion who presents with concerns for generalized weakness and poor oral intake and is admitted for concerns of aspiration pneumonia and hypovolemic hyponatremia secondary to esophageal dysphagia.   Overnight Events: None  Interim History: Patient feels like she is doing well today. No new concerns. Denies any shortness of breath, coughing. NPO for an EGD today.  OBJECTIVE:  Vital Signs: Vitals:   03/24/23 0451 03/24/23 0502 03/24/23 0755 03/24/23 0900  BP: 107/61  109/68 102/86  Pulse:   96 99  Resp:   19 14  Temp: 98.3 F (36.8 C)  97.9 F (36.6 C) 97.8 F (36.6 C)  TempSrc: Oral  Oral Temporal  SpO2:   96%   Weight:  52.4 kg    Height:       Supplemental O2: Room Air SpO2: 96 %  Filed Weights   03/23/23 0349 03/23/23 1331 03/24/23 0502  Weight: 65.3 kg 54 kg 52.4 kg    Intake/Output Summary (Last 24 hours) at 03/24/2023 1043 Last data filed at 03/24/2023 1029 Gross per 24 hour  Intake 284.01 ml  Output 2000 ml  Net -1715.99 ml   Net IO Since Admission: -2,715.99 mL [03/24/23 1043]  Physical Exam: General: laying in bed, in no acute distress; appears slightly jaundiced Cardiac: Afib, normal rate; no LE edema Pulmonary: continues to have diffuse rhonchi; intermittent wet cough observed; normal rate and effort Skin: stage 1 decubitus ulcer Neuro: No focal deficits noted, alert and oriented  Patient Lines/Drains/Airways Status     Active Line/Drains/Airways     Name Placement date Placement time Site Days   Peripheral IV 03/22/23 20 G Right Antecubital 03/22/23  1522  Antecubital  1   Peripheral IV 03/22/23 20 G Anterior;Left;Proximal Forearm 03/22/23  1755  Forearm  1    Urethral Catheter Gerardo A, RN Double-lumen 14 Fr. 12/01/22  1320  Double-lumen  112   Pressure Injury 11/21/21 Buttocks Left;Medial Stage 2 -  Partial thickness loss of dermis presenting as a shallow open injury with a red, pink wound bed without slough. 11/21/21  1112  -- 487           Pertinent Labs:    Latest Ref Rng & Units 03/24/2023    3:07 AM 03/23/2023    7:38 AM 03/22/2023    3:13 PM  CBC  WBC 4.0 - 10.5 K/uL 6.6  9.0  12.2   Hemoglobin 12.0 - 15.0 g/dL 9.2  9.3  40.3   Hematocrit 36.0 - 46.0 % 27.8  27.9  31.4   Platelets 150 - 400 K/uL 320  351  333       Latest Ref Rng & Units 03/24/2023    6:28 AM 03/24/2023    3:07 AM 03/23/2023    1:43 PM  CMP  Glucose 70 - 99 mg/dL  474  259   BUN 8 - 23 mg/dL  26  39   Creatinine 5.63 - 1.00 mg/dL  8.75  6.43   Sodium 329 - 145 mmol/L  129  127   Potassium 3.5 - 5.1 mmol/L  3.9  4.1   Chloride 98 - 111 mmol/L  97  94   CO2 22 - 32 mmol/L  23  23  Calcium 8.9 - 10.3 mg/dL  9.6  9.9   Total Protein 6.5 - 8.1 g/dL 5.3     Total Bilirubin <1.2 mg/dL 0.3     Alkaline Phos 38 - 126 U/L 55     AST 15 - 41 U/L 29     ALT 0 - 44 U/L 20      Recent Labs    03/24/23 1039  GLUCAP 98     Pertinent Imaging: CT HEAD WO CONTRAST ( ) Result Date: 03/24/2023 CLINICAL DATA:  Dysphagia EXAM: CT HEAD WITHOUT CONTRAST TECHNIQUE: Contiguous axial images were obtained from the base of the skull through the vertex without intravenous contrast. RADIATION DOSE REDUCTION: This exam was performed according to the departmental dose-optimization program which includes automated exposure control, adjustment of the mA and/or kV according to patient size and/or use of iterative reconstruction technique. COMPARISON:  None Available. FINDINGS: Brain: No mass, hemorrhage or extra-axial collection. There is generalized volume loss. There is hypoattenuation of the bilateral supratentorial white matter. Old left occipital infarct. Vascular: There is  atherosclerotic calcification of both internal carotid arteries at the skull base. Skull: Normal. Negative for fracture or focal lesion. Sinuses/Orbits: No acute finding. Other: None. IMPRESSION: 1. No acute intracranial abnormality. 2. Old left occipital infarct and findings of chronic small vessel ischemia. Electronically Signed   By: Deatra Robinson M.D.   On: 03/24/2023 02:30    ASSESSMENT/PLAN:  Assessment: Principal Problem:   Esophageal dysphagia Active Problems:   COPD (chronic obstructive pulmonary disease) (HCC)   Decubitus skin ulcer   Aspiration pneumonia (HCC)   Hyponatremia   Chronic CHF (congestive heart failure) (HCC)   Atrial fibrillation with RVR (HCC)   GI bleeding  Lisa Maxwell is a 77 y.o. person living with a history of esophageal strictures s/p dilation procedures, prior saddle embolus (11/2021), atrial fibrillation on Eliquis, HFpEF, type 2 diabetes, CAD, and chronic low back pain s/p spinal cord stimulation insertion who presents with concerns for generalized weakness and poor oral intake and is admitted for aspiration pneumonia on hospital day 1.  Plan:  Aspiration pneumonia 2/2 esophageal +/- oropharyngeal dysphagia History of esophageal strictures, non-obstructive Schatzki's ring s/p dilation GI following, appreciate their assistance. Continues to experience dysphagia, may be oropharyngeal and/or esophageal. CT head negative for acute stroke. Afebrile, satting well on RA, no new concerns today. - GI following, EGD planned today; appreciate assistance - continue Unasyn - NPO for EGD - F/u swallow eval - Follow up blood cultures  Elevated Lipase Jaundice Mild Epigastric tenderness Unintentional weight loss Patient has lost a significant amount of weight over the past six months, unintentionally. Some may be due to poor oral intake and general deconditioning, however given her tobacco use, nausea, and elevated lipase, we are concerned for possible GI/GU  malignancy. A nodular opacity was also seen in her right lung, which we will image to further characterize.  - CT chest, abdomen/pelvis with contrast   Atrial fibrillation, on anticoagulation HFpEF (EF 55-60% in 11/2022) Still in atrial fibrillation this morning. Blood pressure stable. No acute concerns.  - Holding eliquis for EGD - Holding spironolactone, lasix given hypotension   Hyponatremia, improved Dehydration in the setting of poor solute intake Hypotension, improved Sodium 129, up from 127 yesterday. Continues to improve with IV fluids. We will continue repleting her volume today. BP has been stable.  - Continue repleting fluids with 1L LR today - Monitor BP closely   Normocytic anemia History of posterior anal fissure and internal  hemorrhoids Hemoglobin 9.2 today, stable from 9.3 yesterday. Will continue to monitor.  - recheck CBC tomorrow AM   Chronic conditions: COPD, lung opacity in RUL: on room air at home; uses breo ellipta and albuterol inhalers at home, restarted. Getting a CT chest to characterize it.  Chronic back pain s/p spinal cord stimulator: taking oxycodone 10 mg QID as needed, robaxin 500 mg TID, reordered. Takes gabapentin 600 mg at bedtime, held.  Type 2 diabetes mellitus: on Rybelsus 3 mg daily, held. A1c 6.1 nine months ago. CAD/HLD: on lovastatin 40 mg daily, reordered. Takes nitro 0.4 mg prn at home, held. GERD: takes protonix 40 mg daily at home, reordered.  Intertrigo: has fungal rash under breasts and inguinal areas, uses nystatin cream at home, reordered. Prior left occipital lobe infarct: old infarct seen on CT non-contrast   Diet: Clears VTE: Held IVF: LR, 1L bolus Code: Full  Best Practice: Diet: Clear liquid diet IVF: Fluids: LR, Rate:  500 cc bolus VTE: SCDs Start: 03/22/23 1819 Code: Full Therapy Recs: Pending DISPO: Anticipated discharge in 2-4 days to Skilled nursing facility vs home pending medical workup, treatment, PT/OT  eval.  Signature: Annett Fabian, MD  Internal Medicine Resident, PGY-1 [facility]  Pager: [phone] 10:43 AM, 03/24/2023   Please contact the on call pager after 5 pm and on weekends at [phone].

## 2023-03-24 NOTE — Progress Notes (Signed)
PHARMACY NOTE:  ANTIMICROBIAL RENAL DOSAGE ADJUSTMENT  Current antimicrobial regimen includes a mismatch between antimicrobial dosage and estimated renal function.  As per policy approved by the Pharmacy & Therapeutics and Medical Executive Committees, the antimicrobial dosage will be adjusted accordingly.   Current antimicrobial dosage:  Ampicillin-sublactam/Unasyn 3 grams every 12 hours.  Indication: Aspiration pneumonia  Renal Function:  Estimated Creatinine Clearance: 32.6 mL/min (by C-G formula based on SCr of 0.99 mg/dL).  Antimicrobial INTERVAL has been changed to:  3 grams every 8 hours.   Thank you for allowing pharmacy to be a part of this patient's care.  Elicia Lamp, RPH-CPP, PharmD 03/24/2023 10:22 AM

## 2023-03-24 NOTE — NC FL2 (Signed)
[place] MEDICAID [facility] LEVEL OF CARE FORM     IDENTIFICATION  Patient Name: Lisa Maxwell Birthdate: 07-Jun-1945 Sex: female Admission Date (Current Location): 03/22/2023  [facility] and [place] Number:  Producer, television/film/video and Address:  [facility] [place], [address], [place], [place] [address]      Provider Number: [id number]  Attending Physician Name and Address:  Reymundo Poll, MD  Relative Name and Phone Number:       Current Level of Care: Hospital Recommended Level of Care: Skilled Nursing Facility Prior Approval Number:    Date Approved/Denied:   PASRR Number: [id number]  Discharge Plan: SNF    Current Diagnoses: Patient Active Problem List   Diagnosis Date Noted   Aspiration pneumonia (HCC) 03/22/2023   Hyponatremia 03/22/2023   Chronic CHF (congestive heart failure) (HCC) 03/22/2023   Atrial fibrillation with RVR (HCC) 03/22/2023   GI bleeding 03/22/2023   Hypercalcemia 12/01/2022   Hyperlipidemia 12/01/2022   Low back pain 11/30/2022   Recurrent UTI 11/26/2022   Medication management 11/26/2022   Urinary tract infection without hematuria 11/17/2022   Esophageal stricture 06/17/2022   Anorexia 06/17/2022   Abnormal barium swallow 06/17/2022   Esophageal dysphagia 06/17/2022   L1 vertebral fracture (HCC) 06/09/2022   Esophageal abnormality 06/09/2022   History of pulmonary embolus (PE) 05/28/2022   Tinea unguium 02/03/2022   History of pulmonary embolism 11/28/2021   Pulmonary embolism (HCC) 11/21/2021   Decubitus skin ulcer 11/21/2021   Cauda equina compression (HCC) 08/28/2021   Class 1 obesity due to excess calories with serious comorbidity and body mass index (BMI) of 34.0 to 34.9 in adult 05/20/2021   Dyshidrotic eczema 04/29/2021   DM2 (diabetes mellitus, type 2) (HCC) 04/29/2021   Chronic, continuous use of opioids 03/04/2021   Diabetes (HCC) 11/08/2020   Hypertension 11/08/2020   Muscle pain 04/29/2020   Cancer  (HCC)    Cerebrovascular disease    Chronic kidney disease    Dysrhythmia    Heart murmur    Osteoarthritis of left knee    OSA (obstructive sleep apnea)    S/P insertion of spinal cord stimulator 07/13/2019   Preop cardiovascular exam 05/01/2019   Pain management contract agreement 04/13/2019   Spondylolisthesis 03/20/2019   Lumbar spondylosis 03/20/2019   Spondylosis of lumbosacral region without myelopathy or radiculopathy 01/30/2019   DDD (degenerative disc disease), lumbar 11/17/2018   Facet joint disease 11/17/2018   History of knee replacement, total, bilateral 11/17/2018   Type 2 diabetes mellitus (HCC) 09/28/2018   Hypertensive disorder 09/28/2018   COPD (chronic obstructive pulmonary disease) (HCC) 09/28/2018   Increased body mass index 09/28/2018   Smoker 09/28/2018   Chronic renal failure 09/28/2018   Peripheral vascular disease (HCC) 09/28/2018   Atrial fibrillation, chronic (HCC) 09/28/2018   Chronic bilateral low back pain without sciatica 03/08/2018   Chronic pain syndrome 03/08/2018   Facet arthritis of lumbar region 03/08/2018   Foraminal stenosis of lumbar region 03/08/2018   Lumbar radiculopathy 03/08/2018   Spinal stenosis of lumbar region with neurogenic claudication 03/08/2018   CAD (coronary artery disease) 11/04/2016   Essential hypertension 11/04/2016   Diabetes mellitus due to underlying condition with unspecified complications (HCC) 11/04/2016   Mixed dyslipidemia 11/04/2016   Morbid obesity (HCC) 11/04/2016   PAF (paroxysmal atrial fibrillation) (HCC) 04/30/2015   Renal insufficiency 04/30/2015   CKD stage 3b, GFR 30-44 ml/min (HCC)     Orientation RESPIRATION BLADDER Height & Weight  Self, Time, Situation, Place  Normal Continent, Indwelling catheter Weight: 115 lb 8.3 oz (52.4 kg) Height:  4\' 8"  (142.2 cm)  BEHAVIORAL SYMPTOMS/MOOD NEUROLOGICAL BOWEL NUTRITION STATUS      Continent Diet (see dc summary)  AMBULATORY STATUS COMMUNICATION  OF NEEDS Skin   Extensive Assist Verbally                         Personal Care Assistance Level of Assistance  Bathing, Dressing, Feeding Bathing Assistance: Maximum assistance Feeding assistance:  (see dc summary) Dressing Assistance: Maximum assistance     Functional Limitations Info  Sight, Hearing, Speech Sight Info: Impaired (Glasses) Hearing Info: Adequate Speech Info: Adequate    SPECIAL CARE FACTORS FREQUENCY  PT (By licensed PT), OT (By licensed OT), Speech therapy     PT Frequency: 5x week OT Frequency: 5x week     Speech Therapy Frequency: 5x week      Contractures Contractures Info: Not present    Additional Factors Info  Code Status, Insulin Sliding Scale Code Status Info: Full     Insulin Sliding Scale Info: see dc summary       Current Medications (03/24/2023):  This is the current hospital active medication list Current Facility-Administered Medications  Medication Dose Route Frequency Provider Last Rate Last Admin   0.9 %  sodium chloride infusion   Intravenous Continuous Marrianne Mood, MD 100 mL/hr at 03/24/23 1346 New Bag at 03/24/23 1346   Ampicillin-Sulbactam (UNASYN) 3 g in sodium chloride 0.9 % 100 mL IVPB  3 g Intravenous Q8H Reymundo Poll, MD 200 mL/hr at 03/24/23 1355 3 g at 03/24/23 1355   apixaban (ELIQUIS) tablet 5 mg  5 mg Oral BID Reymundo Poll, MD       Chlorhexidine Gluconate Cloth 2 % PADS 6 each  6 each Topical Daily Pyrtle, Carie Caddy, MD   6 each at 03/24/23 0819   fluticasone furoate-vilanterol (BREO ELLIPTA) 100-25 MCG/ACT 1-2 puff  1-2 puff Inhalation Daily Marrianne Mood, MD   2 puff at 03/24/23 0818   leptospermum manuka honey (MEDIHONEY) paste 1 Application  1 Application Topical Daily Reymundo Poll, MD       methocarbamol (ROBAXIN) tablet 250 mg  250 mg Oral TID Marrianne Mood, MD   250 mg at 03/24/23 6045   nystatin cream (MYCOSTATIN) 1 Application  1 Application Topical BID Marrianne Mood, MD   1  Application at 03/24/23 4098   oxyCODONE (Oxy IR/ROXICODONE) immediate release tablet 10 mg  10 mg Oral QID PRN Reymundo Poll, MD   10 mg at 03/24/23 0508   pantoprazole (PROTONIX) EC tablet 40 mg  40 mg Oral Daily Marrianne Mood, MD   40 mg at 03/24/23 0813   pravastatin (PRAVACHOL) tablet 40 mg  40 mg Oral q1800 Marrianne Mood, MD       sodium chloride flush (NS) 0.9 % injection 3 mL  3 mL Intravenous PRN Marrianne Mood, MD         Discharge Medications: Please see discharge summary for a list of discharge medications.  Relevant Imaging Results:  Relevant Lab Results:   Additional Information SS#: [id number]  Michaela Corner, LCSWA

## 2023-03-24 NOTE — Evaluation (Signed)
Occupational Therapy Evaluation Patient Details Name: Lisa Maxwell MRN: [id number] DOB: 09-16-1945 Today's Date: 03/24/2023   History of Present Illness 77 yo female presents 12/16 with generalized weakness and poor oral intake and is admitted for concerns of aspiration pneumonia and hypovolemic hyponatremia secondary to esophageal dysphagia.Found to have aspiration PNA, hyponatremia, in Afib, dehydration and GI bleed  PMH of esophageal strictures s/p dilation procedures, prior saddle embolus (11/2021), atrial fibrillation on Eliquis, HFpEF, type 2 diabetes, CAD, and chronic low back pain s/p spinal cord stimulation insertion AD, cerebrovascular disease, a-fib, COPD, chronic pain syndrome, renal failure, DDD, HTN, bil TKA, PVD, spinal stenosis lumbar spine with neurogenic claudication, cauda equina compression CAD CKD   Clinical Impression   Pt presented with husband at this time and he reported that at baseline she was able to complete step pivot transfer with supervision to [facility] from the bed with TLSO and B AFO donned by family member prior to transfer. At this time she required max assist and with max verbal cues with all bed mobility. Pt at this time was able to sit at EOB for about 5 mins and noted to start to lateral lean to R side and was unaware. In the session she was able to to follow one step commands inconsistently and had to be redirected away from pulling onto her catheter. Patient will benefit from continued inpatient follow up therapy, <3 hours/day.      If plan is discharge home, recommend the following: Two people to help with walking and/or transfers;A lot of help with bathing/dressing/bathroom;Assistance with cooking/housework;Assistance with feeding;Assist for transportation    Functional Status Assessment  Patient has had a recent decline in their functional status and demonstrates the ability to make significant improvements in function in a reasonable and predictable amount  of time.  Equipment Recommendations   (TBD at next level of care)    Recommendations for Other Services       Precautions / Restrictions Precautions Precautions: Fall Restrictions Weight Bearing Restrictions Per Provider Order: No Other Position/Activity Restrictions: wears TLSO OOB, and AFOs for therapy and transfers to wheelchair when she is leaving home      Mobility Bed Mobility Overal bed mobility: Needs Assistance Bed Mobility: Rolling Rolling: Max assist         General bed mobility comments: Pt was repositioned onto R side as was on L side at end session    Transfers                   General transfer comment: deffered transfers at this time      Balance Overall balance assessment: Needs assistance Sitting-balance support: Feet supported, No upper extremity supported, Bilateral upper extremity supported, Single extremity supported Sitting balance-Leahy Scale: Fair Sitting balance - Comments: was able to be unsuported with min guard for 1-2 mins but then started to lateral lean into HOB but then reporting they were in a sitting position Postural control: Right lateral lean                                 ADL either performed or assessed with clinical judgement   ADL Overall ADL's : Needs assistance/impaired Eating/Feeding: NPO   Grooming: Wash/dry hands;Minimal assistance;Sitting   Upper Body Bathing: Maximal assistance;Sitting   Lower Body Bathing: Total assistance;Bed level   Upper Body Dressing : Maximal assistance;Sitting   Lower Body Dressing: Total assistance;Bed level  Toileting- Clothing Manipulation and Hygiene: Total assistance;Bed level         General ADL Comments: deffered transfers for further sessions at this time     Vision         Perception         Praxis         Pertinent Vitals/Pain Pain Assessment Pain Assessment: Faces Faces Pain Scale: Hurts little more Pain Location: back Pain  Descriptors / Indicators: Aching Pain Intervention(s): Limited activity within patient's tolerance, Monitored during session, Repositioned     Extremity/Trunk Assessment Upper Extremity Assessment Upper Extremity Assessment: LUE deficits/detail LUE Deficits / Details: Pt's husband reproted thathey have had difficulties with LUE with rotator cuff and very limited AROM LUE: Unable to fully assess due to pain LUE Sensation: WNL LUE Coordination: decreased gross motor   Lower Extremity Assessment Lower Extremity Assessment: Defer to PT evaluation   Cervical / Trunk Assessment Cervical / Trunk Assessment: Kyphotic   Communication Communication Communication: Hearing impairment Cueing Techniques: Verbal cues   Cognition Arousal: Alert Behavior During Therapy: WFL for tasks assessed/performed Overall Cognitive Status: Impaired/Different from baseline Area of Impairment: Orientation, Attention, Memory, Following commands, Safety/judgement, Awareness, Problem solving                 Orientation Level: Disoriented to, Place, Time, Situation   Memory: Decreased recall of precautions, Decreased short-term memory Following Commands: Follows one step commands inconsistently Safety/Judgement: Decreased awareness of safety, Decreased awareness of deficits   Problem Solving: Slow processing, Decreased initiation, Difficulty sequencing, Requires verbal cues       General Comments       Exercises     Shoulder Instructions      Home Living Family/patient expects to be discharged to:: Private residence Living Arrangements: Spouse/significant other Available Help at Discharge: Family;Available 24 hours/day Type of Home: House Home Access: Ramped entrance     Home Layout: Two level;Bed/bath upstairs Alternate Level Stairs-Number of Steps: 4 steps - Has stair lift Alternate Level Stairs-Rails: Right Bathroom Shower/Tub: Producer, television/film/video:  (uses 3 in 1  commode)     Home Equipment: Agricultural consultant (2 wheels);Wheelchair - manual;Hospital bed;BSC/3in1 (WC arms will swing back,)   Additional Comments: has a stair lift      Prior Functioning/Environment Prior Level of Function : Needs assist             Mobility Comments: with supervision is able to transfer from bed to wheelchair, per hsuband reported it would be a 180 transfer due to space in the home. ADLs Comments: spouse assists with ADLs and iADLs        OT Problem List: Decreased strength;Decreased range of motion;Decreased activity tolerance;Impaired balance (sitting and/or standing);Decreased safety awareness;Decreased knowledge of use of DME or AE;Cardiopulmonary status limiting activity;Pain;Impaired UE functional use      OT Treatment/Interventions: Self-care/ADL training;Therapeutic exercise;DME and/or AE instruction;Therapeutic activities;Patient/family education;Balance training    OT Goals(Current goals can be found in the care plan section) Acute Rehab OT Goals Patient Stated Goal: to have pt attempt to go to EOB OT Goal Formulation: With patient/family Time For Goal Achievement: 04/07/23 Potential to Achieve Goals: Fair  OT Frequency: Min 1X/week    Co-evaluation              AM-PAC OT "6 Clicks" Daily Activity     Outcome Measure Help from another person eating meals?: Total Help from another person taking care of personal grooming?: A Lot Help from another person  toileting, which includes using toliet, bedpan, or urinal?: Total Help from another person bathing (including washing, rinsing, drying)?: Total Help from another person to put on and taking off regular upper body clothing?: A Lot Help from another person to put on and taking off regular lower body clothing?: Total 6 Click Score: 8   End of Session Nurse Communication: Mobility status  Activity Tolerance: Patient tolerated treatment well Patient left: in bed;with call bell/phone within  reach;with bed alarm set;with family/visitor present  OT Visit Diagnosis: Unsteadiness on feet (R26.81);Other abnormalities of gait and mobility (R26.89);Repeated falls (R29.6);Muscle weakness (generalized) (M62.81);Pain Pain - Right/Left:  (back)                Time: 9528-4132 OT Time Calculation (min): 33 min Charges:  OT General Charges $OT Visit: 1 Visit OT Evaluation $OT Eval Moderate Complexity: 1 Mod OT Treatments $Self Care/Home Management : 8-22 mins  Presley Raddle OTR/L  Acute Rehab Services  [phone] office number   Alphia Moh 03/24/2023, 1:31 PM

## 2023-03-24 NOTE — Plan of Care (Signed)
°  Problem: Coping: °Goal: Level of anxiety will decrease °Outcome: Progressing °  °

## 2023-03-24 NOTE — TOC Progression Note (Addendum)
Transition of Care [facility]) - Progression Note    Patient Details  Name: Lisa Maxwell MRN: [id number] Date of Birth: 1945-11-24  Transition of Care [facility]) CM/SW Contact  Michaela Corner, [place] Phone Number: 03/24/2023, 2:55 PM  Clinical Narrative:   CSW sent referral to [facility]. Pending bed offer.   3:22PM: CSW left VM for admin at facility about referral sent.  Expected Discharge Plan: Skilled Nursing Facility Barriers to Discharge: Continued Medical Work up  Expected Discharge Plan and Services In-house Referral: Clinical Social Work                                             Social Determinants of Health (SDOH) Interventions SDOH Screenings   Food Insecurity: No Food Insecurity (03/22/2023)  Housing: Low Risk  (03/22/2023)  Transportation Needs: No Transportation Needs (03/22/2023)  Utilities: Not At Risk (03/22/2023)  Alcohol Screen: Low Risk  (07/31/2022)  Depression (PHQ2-9): Low Risk  (07/31/2022)  Financial Resource Strain: Low Risk  (07/31/2022)  Physical Activity: Unknown (07/31/2022)  Tobacco Use: High Risk (03/24/2023)    Readmission Risk Interventions     No data to display

## 2023-03-24 NOTE — Evaluation (Signed)
OT Cancellation Note  Patient Details Name: Lisa Maxwell MRN: [id number] DOB: 1945/04/07   Cancelled Treatment:    Reason Eval/Treat Not Completed: Patient at procedure or test/ unavailable. Will follow up.  Presley Raddle OTR/L  Acute Rehab Services  [phone] office number   Alphia Moh 03/24/2023, 9:41 AM

## 2023-03-24 NOTE — Anesthesia Preprocedure Evaluation (Signed)
Anesthesia Evaluation  Patient identified by MRN, date of birth, ID band Patient awake    Reviewed: Allergy & Precautions, NPO status , Patient's Chart, lab work & pertinent test results, reviewed documented beta blocker date and time   History of Anesthesia Complications Negative for: history of anesthetic complications  Airway Mallampati: III  TM Distance: >3 FB     Dental  (+) Edentulous Lower, Edentulous Upper   Pulmonary sleep apnea , pneumonia, unresolved, COPD, Current Smoker C/f aspiration pneumonia    + decreased breath sounds      Cardiovascular hypertension, + CAD, + Peripheral Vascular Disease and +CHF  (-) Past MI + dysrhythmias Atrial Fibrillation + Valvular Problems/Murmurs  Rhythm:Irregular Rate:Normal  Preserved LVEF   Neuro/Psych neg Seizures  Neuromuscular disease    GI/Hepatic ,,,(+) neg Cirrhosis        Endo/Other  diabetes, Type 2    Renal/GU CRFRenal disease     Musculoskeletal  (+) Arthritis ,    Abdominal   Peds  Hematology   Anesthesia Other Findings   Reproductive/Obstetrics                              Anesthesia Physical Anesthesia Plan  ASA: 3  Anesthesia Plan: MAC   Post-op Pain Management:    Induction:   PONV Risk Score and Plan: 1 and Ondansetron and Propofol infusion  Airway Management Planned: Natural Airway  Additional Equipment:   Intra-op Plan:   Post-operative Plan:   Informed Consent: I have reviewed the patients History and Physical, chart, labs and discussed the procedure including the risks, benefits and alternatives for the proposed anesthesia with the patient or authorized representative who has indicated his/her understanding and acceptance.       Plan Discussed with: CRNA  Anesthesia Plan Comments:          Anesthesia Quick Evaluation

## 2023-03-24 NOTE — Op Note (Signed)
[facility] Patient Name: Lisa Maxwell Procedure Date : 03/24/2023 MRN: [id number] Attending MD: Beverley Fiedler , MD, [id number] Date of Birth: Jan 21, 1946 CSN: [id number] Age: 77 Admit Type: Inpatient Procedure:                Upper GI endoscopy Indications:              Oropharyngeal phase dysphagia, Esophageal                            dysphagia, dysphagia responsive to Savary 16 mm                            dilation in March 2024 with Dr. Meridee Score, now                            with aspiration PNA Providers:                Carie Caddy. Rhea Belton, MD, Glory Rosebush, RN, Kandice Robinsons, Technician Referring MD:             IM Teaching Service Medicines:                Monitored Anesthesia Care Complications:            No immediate complications. Estimated Blood Loss:     Estimated blood loss: none. Procedure:                Pre-Anesthesia Assessment:                           - Prior to the procedure, a History and Physical                            was performed, and patient medications and                            allergies were reviewed. The patient's tolerance of                            previous anesthesia was also reviewed. The risks                            and benefits of the procedure and the sedation                            options and risks were discussed with the patient.                            All questions were answered, and informed consent                            was obtained. Prior Anticoagulants: The patient has  taken no anticoagulant or antiplatelet agents. ASA                            Grade Assessment: III - A patient with severe                            systemic disease. After reviewing the risks and                            benefits, the patient was deemed in satisfactory                            condition to undergo the procedure.                           After obtaining  informed consent, the endoscope was                            passed under direct vision. Throughout the                            procedure, the patient's blood pressure, pulse, and                            oxygen saturations were monitored continuously. The                            [id number]-H190 ([id number] Olympus endoscope was introduced                            through the mouth, and advanced to the second part                            of duodenum. The upper GI endoscopy was                            accomplished without difficulty. The patient                            tolerated the procedure well. Scope In: Scope Out: Findings:      Normal mucosa was found in the entire esophagus.      A non-obstructing Schatzki ring was found at the gastroesophageal       junction. A guidewire was placed and the scope was withdrawn. Dilation       was performed with a Savary dilator with mild resistance at 16 mm.      A 2 cm hiatal hernia was present.      The entire examined stomach was normal.      The examined duodenum was normal. Impression:               - Normal mucosa was found in the entire esophagus.                           - Non-obstructing Schatzki ring. Dilated with 16 mm  Savary over a guidewire.                           - 2 cm hiatal hernia.                           - Normal stomach.                           - Normal examined duodenum.                           - No specimens collected. Moderate Sedation:      N/A Recommendation:           - Return patient to hospital ward for ongoing care.                           - NPO and defer advancement of diet to speech and                            swallow therapy.                           - Continue present medications.                           - Some component of dysphagia is dysmotility                            related. No evidence of Candida esophagitis. No                             significant esophageal narrowings to explain                            symptoms fully. Hopeful improvement as before with                            dilation today. Continue treatment for aspiration                            pneumonia per primary team.                           - GI service will sign-off, call if questions. Procedure Code(s):        --- Professional ---                           [id number], Esophagogastroduodenoscopy, flexible,                            transoral; with insertion of guide wire followed by                            passage of dilator(s) through esophagus over guide  wire Diagnosis Code(s):        --- Professional ---                           K22.2, Esophageal obstruction                           R13.12, Dysphagia, oropharyngeal phase                           R13.14, Dysphagia, pharyngoesophageal phase CPT copyright 2022 [employer]. All rights reserved. The codes documented in this report are preliminary and upon coder review may  be revised to meet current compliance requirements. Beverley Fiedler, MD 03/24/2023 10:40:08 AM This report has been signed electronically. Number of Addenda: 0

## 2023-03-25 ENCOUNTER — Inpatient Hospital Stay (HOSPITAL_COMMUNITY): Payer: Medicare HMO

## 2023-03-25 DIAGNOSIS — R1310 Dysphagia, unspecified: Secondary | ICD-10-CM | POA: Diagnosis not present

## 2023-03-25 DIAGNOSIS — J690 Pneumonitis due to inhalation of food and vomit: Secondary | ICD-10-CM | POA: Diagnosis not present

## 2023-03-25 DIAGNOSIS — E871 Hypo-osmolality and hyponatremia: Secondary | ICD-10-CM | POA: Diagnosis not present

## 2023-03-25 DIAGNOSIS — I4891 Unspecified atrial fibrillation: Secondary | ICD-10-CM | POA: Diagnosis not present

## 2023-03-25 DIAGNOSIS — R1319 Other dysphagia: Secondary | ICD-10-CM

## 2023-03-25 LAB — BASIC METABOLIC PANEL
Anion gap: 9 (ref 5–15)
BUN: 14 mg/dL (ref 8–23)
CO2: 23 mmol/L (ref 22–32)
Calcium: 9.5 mg/dL (ref 8.9–10.3)
Chloride: 99 mmol/L (ref 98–111)
Creatinine, Ser: 1.14 mg/dL — ABNORMAL HIGH (ref 0.44–1.00)
GFR, Estimated: 50 mL/min — ABNORMAL LOW (ref >60)
Glucose, Bld: 78 mg/dL (ref 70–99)
Potassium: 3.6 mmol/L (ref 3.5–5.1)
Sodium: 131 mmol/L — ABNORMAL LOW (ref 135–145)

## 2023-03-25 LAB — CBC
HCT: 26.2 % — ABNORMAL LOW (ref 36.0–46.0)
Hemoglobin: 8.5 g/dL — ABNORMAL LOW (ref 12.0–15.0)
MCH: 28.7 pg (ref 26.0–34.0)
MCHC: 32.4 g/dL (ref 30.0–36.0)
MCV: 88.5 fL (ref 80.0–100.0)
Platelets: 311 10*3/uL (ref 150–400)
RBC: 2.96 MIL/uL — ABNORMAL LOW (ref 3.87–5.11)
RDW: 15.2 % (ref 11.5–15.5)
WBC: 7.1 10*3/uL (ref 4.0–10.5)
nRBC: 0 % (ref 0.0–0.2)

## 2023-03-25 MED ORDER — SODIUM CHLORIDE 0.9 % IV SOLN: INTRAVENOUS | Status: AC

## 2023-03-25 MED ORDER — LORAZEPAM 0.5 MG PO TABS: 0.5000 mg | ORAL_TABLET | Freq: Once | ORAL | Status: DC | PRN

## 2023-03-25 NOTE — Plan of Care (Signed)
  Problem: Education: Goal: Knowledge of General Education information will improve Description: Including pain rating scale, medication(s)/side effects and non-pharmacologic comfort measures Outcome: Progressing   Problem: Coping: Goal: Level of anxiety will decrease Outcome: Progressing   Problem: Skin Integrity: Goal: Risk for impaired skin integrity will decrease Outcome: Progressing   

## 2023-03-25 NOTE — Plan of Care (Signed)
  Problem: Education: Goal: Knowledge of General Education information will improve Description: Including pain rating scale, medication(s)/side effects and non-pharmacologic comfort measures 03/25/2023 0645 by Pearlean Brownie, RN Outcome: Progressing 03/25/2023 0645 by Pearlean Brownie, RN Outcome: Progressing   Problem: Health Behavior/Discharge Planning: Goal: Ability to manage health-related needs will improve 03/25/2023 0645 by Pearlean Brownie, RN Outcome: Progressing 03/25/2023 0645 by Pearlean Brownie, RN Outcome: Progressing   Problem: Clinical Measurements: Goal: Ability to maintain clinical measurements within normal limits will improve 03/25/2023 0645 by Pearlean Brownie, RN Outcome: Progressing 03/25/2023 0645 by Pearlean Brownie, RN Outcome: Progressing Goal: Will remain free from infection 03/25/2023 0645 by Pearlean Brownie, RN Outcome: Progressing 03/25/2023 0645 by Pearlean Brownie, RN Outcome: Progressing Goal: Diagnostic test results will improve 03/25/2023 0645 by Pearlean Brownie, RN Outcome: Progressing 03/25/2023 0645 by Pearlean Brownie, RN Outcome: Progressing Goal: Respiratory complications will improve 03/25/2023 0645 by Pearlean Brownie, RN Outcome: Progressing 03/25/2023 0645 by Pearlean Brownie, RN Outcome: Progressing Goal: Cardiovascular complication will be avoided 03/25/2023 0645 by Pearlean Brownie, RN Outcome: Progressing 03/25/2023 0645 by Pearlean Brownie, RN Outcome: Progressing   Problem: Activity: Goal: Risk for activity intolerance will decrease 03/25/2023 0645 by Pearlean Brownie, RN Outcome: Progressing 03/25/2023 0645 by Pearlean Brownie, RN Outcome: Progressing   Problem: Nutrition: Goal: Adequate nutrition will be maintained 03/25/2023 0645 by Pearlean Brownie, RN Outcome: Progressing 03/25/2023 0645 by Pearlean Brownie, RN Outcome: Progressing   Problem: Coping: Goal: Level of anxiety will decrease 03/25/2023  0645 by Pearlean Brownie, RN Outcome: Progressing 03/25/2023 0645 by Pearlean Brownie, RN Outcome: Progressing   Problem: Elimination: Goal: Will not experience complications related to bowel motility 03/25/2023 0645 by Pearlean Brownie, RN Outcome: Progressing 03/25/2023 0645 by Pearlean Brownie, RN Outcome: Progressing Goal: Will not experience complications related to urinary retention 03/25/2023 0645 by Pearlean Brownie, RN Outcome: Progressing 03/25/2023 0645 by Pearlean Brownie, RN Outcome: Progressing   Problem: Pain Management: Goal: General experience of comfort will improve 03/25/2023 0645 by Pearlean Brownie, RN Outcome: Progressing 03/25/2023 0645 by Pearlean Brownie, RN Outcome: Progressing   Problem: Safety: Goal: Ability to remain free from injury will improve 03/25/2023 0645 by Pearlean Brownie, RN Outcome: Progressing 03/25/2023 0645 by Pearlean Brownie, RN Outcome: Progressing   Problem: Skin Integrity: Goal: Risk for impaired skin integrity will decrease 03/25/2023 0645 by Pearlean Brownie, RN Outcome: Progressing 03/25/2023 0645 by Pearlean Brownie, RN Outcome: Progressing

## 2023-03-25 NOTE — Progress Notes (Signed)
Physical Therapy Treatment Patient Details Name: Lisa Maxwell MRN: [id number] DOB: 1945/05/26 Today's Date: 03/25/2023   History of Present Illness 77 yo female presents 12/16 with generalized weakness and poor oral intake and is admitted for concerns of aspiration pneumonia and hypovolemic hyponatremia secondary to esophageal dysphagia.Found to have aspiration PNA, hyponatremia, in Afib, dehydration and GI bleed  PMH of esophageal strictures s/p dilation procedures, prior saddle embolus (11/2021), atrial fibrillation on Eliquis, HFpEF, type 2 diabetes, CAD, and chronic low back pain s/p spinal cord stimulation insertion AD, cerebrovascular disease, a-fib, COPD, chronic pain syndrome, renal failure, DDD, HTN, bil TKA, PVD, spinal stenosis lumbar spine with neurogenic claudication, cauda equina compression CAD CKD    PT Comments  Patient resting in bed and agreeable to mobilize with therapy finishing applesauce with assist from friend in room. Max assist required with use of bed pad and bed features to sit up EOB. Pt able to tolerance sitting once on EOB and TLSO donned with max assist. Pt reliant on bil UE support to maintain balance and close CGA for safety. Bil AFO's donned with max assist. Pt c/o bil LE pain Lt>Rt and back pain. Attempted lateral scoots along EOB with Max assist and bed pad to facilitate hip scoot. EOS pt returned to supine and repositioned for comfort. Will continue to progress pt as able during stay.     If plan is discharge home, recommend the following: A lot of help with walking and/or transfers;A lot of help with bathing/dressing/bathroom;Assistance with cooking/housework;Assistance with feeding;Direct supervision/assist for medications management;Direct supervision/assist for financial management;Assist for transportation;Help with stairs or ramp for entrance   Can travel by private vehicle     No  Equipment Recommendations  None recommended by PT    Recommendations  for Other Services       Precautions / Restrictions Precautions Precautions: Fall Restrictions Weight Bearing Restrictions Per Provider Order: No Other Position/Activity Restrictions: wears TLSO OOB, and AFOs for therapy and transfers to wheelchair when she is leaving home     Mobility  Bed Mobility Overal bed mobility: Needs Assistance Bed Mobility: Rolling, Sit to Supine, Supine to Sit Rolling: Max assist   Supine to sit: Max assist, HOB elevated, Used rails Sit to supine: Total assist, +2 for safety/equipment   General bed mobility comments: Max assist to guide LE's to EOB and raise trunk. once EOB able to don TLSO with max assist and bil ankle AFO's with max assist. Pt requires total +2 for safety to return to supine.    Transfers Overall transfer level: Needs assistance                Lateral/Scoot Transfers: Max assist General transfer comment: attempted lateral scoot, max assist to facilitate lift and scoot towards HOB. pt limitd by pain and weakness.    Ambulation/Gait                   Stairs             Wheelchair Mobility     Tilt Bed    Modified Rankin (Stroke Patients Only)       Balance Overall balance assessment: Needs assistance Sitting-balance support: Feet supported, No upper extremity supported, Bilateral upper extremity supported, Single extremity supported Sitting balance-Leahy Scale: Fair Sitting balance - Comments: was able to be unsuported with min guard for 1-2 mins but then started to lateral lean into HOB but then reporting they were in a sitting position Postural control: Right lateral lean  Cognition Arousal: Alert Behavior During Therapy: WFL for tasks assessed/performed Overall Cognitive Status: Impaired/Different from baseline Area of Impairment: Orientation, Attention, Memory, Following commands, Safety/judgement, Awareness, Problem solving                  Orientation Level: Disoriented to, Place, Time, Situation   Memory: Decreased recall of precautions, Decreased short-term memory Following Commands: Follows one step commands inconsistently Safety/Judgement: Decreased awareness of safety, Decreased awareness of deficits   Problem Solving: Slow processing, Decreased initiation, Difficulty sequencing, Requires verbal cues          Exercises      General Comments        Pertinent Vitals/Pain Pain Assessment Pain Assessment: Faces Faces Pain Scale: Hurts little more Pain Location: back Pain Descriptors / Indicators: Aching Pain Intervention(s): Limited activity within patient's tolerance, Monitored during session, Repositioned    Home Living                          Prior Function            PT Goals (current goals can now be found in the care plan section) Acute Rehab PT Goals PT Goal Formulation: With patient/family Time For Goal Achievement: 04/06/23 Potential to Achieve Goals: Fair Progress towards PT goals: Progressing toward goals    Frequency    Min 1X/week      PT Plan      Co-evaluation              AM-PAC PT "6 Clicks" Mobility   Outcome Measure  Help needed turning from your back to your side while in a flat bed without using bedrails?: Total Help needed moving from lying on your back to sitting on the side of a flat bed without using bedrails?: Total Help needed moving to and from a bed to a chair (including a wheelchair)?: Total Help needed standing up from a chair using your arms (e.g., wheelchair or bedside chair)?: Total Help needed to walk in hospital room?: Total Help needed climbing 3-5 steps with a railing? : Total 6 Click Score: 6    End of Session Equipment Utilized During Treatment: Gait belt Activity Tolerance: Patient limited by pain Patient left: with call bell/phone within reach;with family/visitor present Nurse Communication: Mobility status PT Visit  Diagnosis: Muscle weakness (generalized) (M62.81);Difficulty in walking, not elsewhere classified (R26.2);Pain Pain - Right/Left:  (bil) Pain - part of body: Leg (feet , and back)     Time: 1191-4782 PT Time Calculation (min) (ACUTE ONLY): 41 min  Charges:    $Therapeutic Activity: 38-52 mins PT General Charges $$ ACUTE PT VISIT: 1 Visit                     Wynn Maudlin, DPT Acute Rehabilitation Services Office [phone]  03/25/23 4:01 PM

## 2023-03-25 NOTE — Progress Notes (Signed)
Speech Language Pathology Treatment: Dysphagia  Patient Details Name: Lisa Maxwell MRN: [id number] DOB: December 30, 1945 Today's Date: 03/25/2023 Time: 0857-0909 SLP Time Calculation (min) (ACUTE ONLY): 12 min  Assessment / Plan / Recommendation Clinical Impression  Pt is a little more confused this morning and is now coughing with thin liquid trials, which was not observed during initial clinical evaluation. Coughing was eliminated with removal of straw and cues to take one sip at a time. No coughing was noted with small amounts of purees offered. Give clinical signs concerning for decreased airway protection, report of abbreviated MBS at outside facility, and interventions from GI during EGD, feel that it would be appropriate to repeat testing at this time. SLP will complete MBS as soon as can be scheduled. Pending completion, would offer liquids via cup with assistance from staff and/or family (holding if frequent coughing persists), as well as meds crushed in puree.    HPI HPI: Pt is a 77 yo female presenting with generalized weakness and poor oral intake. Per MD H&P, husband reports 2-3 weeks of trouble swallowing, described as food feeling stuck in her throat (consistent with previous stricture), regurgitation with inability to keep food down, and coughing "sometimes" with thin liquids. Pt had swallow studies at [facility] the week PTA . Full report not available but per husband, she could swallow limited amounts on MBS fine, but after the applesauce she started to regurgitate. He said the SLP described it as a "clogged drain" through which limited amounts were passing, but this was not seen on esopahgram later that week. Pt was previously seen by SLP at [facility] in March 2024 with clinical presentation improved after esophageal dilation; no s/s of aspiration. PMH includes: esophageal stricture s/p dilation procedures, prior saddle embolus, afib, HFpEF, DMII, CAD, chronic low back pain s/p spinal cord  stimulator      SLP Plan  MBS      Recommendations for follow up therapy are one component of a multi-disciplinary discharge planning process, led by the attending physician.  Recommendations may be updated based on patient status, additional functional criteria and insurance authorization.    Recommendations  Diet recommendations: Thin liquid Liquids provided via: Cup;No straw Medication Administration: Crushed with puree Supervision: Staff to assist with self feeding;Full supervision/cueing for compensatory strategies Compensations: Slow rate;Small sips/bites;Other (Comment) (one sip at a time) Postural Changes and/or Swallow Maneuvers: Seated upright 90 degrees                  Oral care BID   Frequent or constant Supervision/Assistance Dysphagia, unspecified (R13.10)     MBS      Mahala Menghini., M.A. CCC-SLP Acute Rehabilitation Services Office [phone]  Secure chat preferred   03/25/2023, 9:16 AM

## 2023-03-25 NOTE — Progress Notes (Signed)
MR brain W WO Contrast ordered. RN notified by MRI team that patients Spinal Cord Stimulator and the remote must be fully charged prior to MRI.  Nurse discussed this with patients husband and he went home to get remote. Remote has nearly no charge, and husband was unable to find the charger so MRI holding off for now. Provider notified

## 2023-03-25 NOTE — Evaluation (Signed)
Modified Barium Swallow Study  Patient Details  Name: Lisa Maxwell MRN: [id number] Date of Birth: 19-Apr-1945  Today's Date: 03/25/2023  Modified Barium Swallow completed.  Full report located under Chart Review in the Imaging Section.  History of Present Illness Pt is a 77 yo female presenting with generalized weakness and poor oral intake. Per MD H&P, husband reports 2-3 weeks of trouble swallowing, described as food feeling stuck in her throat (consistent with previous stricture), regurgitation with inability to keep food down, and coughing "sometimes" with thin liquids. Pt had swallow studies at [facility] the week PTA . Full report not available but per husband, she could swallow limited amounts on MBS fine, but after the applesauce she started to regurgitate. He said the SLP described it as a "clogged drain" through which limited amounts were passing, but this was not seen on esopahgram later that week. Pt was previously seen by SLP at [facility] in March 2024 with clinical presentation improved after esophageal dilation; no s/s of aspiration. PMH includes: esophageal stricture s/p dilation procedures, prior saddle embolus, afib, HFpEF, DMII, CAD, chronic low back pain s/p spinal cord stimulator   Clinical Impression Pt has a pharyngeal more than oral dysphagia that includes silent aspiration of liquids. She has impaired timing, laryngeal vestibule closure, sensation, and suspect reduced glottic closure. Small amounts of silent aspiration occur before the swallow with all liquid consistencies. Aspiration after the swallow is suspected with honey thick liquids, which leave mild pharyngeal residuals. Although pt does not cough spontaneously, when asked if she felt like she needed to, she did say yes. Her cued cough did not clear any of the material that had already been aspirated though. She achieves the most consistent airway protection with single cup sips of nectar thick liquids, although the  study was overall somewhat limited by pt motion as well as what she was willing to consume. She refused anything more solid than a puree, and although she did not have any regurgitation with purees, she subjectively felt like she has had no improvements in swallowing since EGD. Recommend full liquid diet thickened to nectar with asistance during PO intake to facilitate use of small, single cup sips (no straws).  Factors that may increase risk of adverse event in presence of aspiration Rubye Oaks & Clearance Coots 2021): Respiratory or GI disease;Reduced cognitive function;Frail or deconditioned;Limited mobility;Weak cough;Aspiration of thick, dense, and/or acidic materials  Swallow Evaluation Recommendations Recommendations: PO diet PO Diet Recommendation: Full liquid diet;Mildly thick liquids (Level 2, nectar thick) Liquid Administration via: Cup;No straw Medication Administration: Crushed with puree Supervision: Staff to assist with self-feeding;Full supervision/cueing for swallowing strategies Swallowing strategies  : Slow rate;Small bites/sips Postural changes: Position pt fully upright for meals;Stay upright 30-60 min after meals Oral care recommendations: Oral care BID (2x/day) Caregiver Recommendations: Avoid jello, ice cream, thin soups, popsicles;Remove water pitcher      Mahala Menghini., M.A. CCC-SLP Acute Rehabilitation Services Office [phone]  Secure chat preferred  03/25/2023,3:18 PM

## 2023-03-25 NOTE — TOC Progression Note (Addendum)
Transition of Care [facility]) - Progression Note    Patient Details  Name: Lisa Maxwell MRN: [id number] Date of Birth: 1945-07-07  Transition of Care [facility]) CM/SW Contact  Michaela Corner, [place] Phone Number: 03/25/2023, 11:41 AM  Clinical Narrative:   CSW met pt and husband at bedside and informed them [facility] has accepted referral at this time. CSW explained submitting for insurance auth process and will submit for auth once PT sees pt.   4:15PM: CSW started ins auth. Auth id: [id number]  Expected Discharge Plan: Skilled Nursing Facility Barriers to Discharge: Continued Medical Work up, SNF Pending bed offer  Expected Discharge Plan and Services In-house Referral: Clinical Social Work                                             Social Determinants of Health (SDOH) Interventions SDOH Screenings   Food Insecurity: No Food Insecurity (03/22/2023)  Housing: Low Risk  (03/22/2023)  Transportation Needs: No Transportation Needs (03/22/2023)  Utilities: Not At Risk (03/22/2023)  Alcohol Screen: Low Risk  (07/31/2022)  Depression (PHQ2-9): Low Risk  (07/31/2022)  Financial Resource Strain: Low Risk  (07/31/2022)  Physical Activity: Unknown (07/31/2022)  Tobacco Use: High Risk (03/24/2023)    Readmission Risk Interventions     No data to display

## 2023-03-25 NOTE — Consult Note (Signed)
[facility] Liaison Note  03/25/2023  Lisa Maxwell [birth year] [id number]  Covering Charlesetta Shanks, RN [facility] Liaison)  Location: [facility] Liaison screened the patient remotely at [facility].  Insurance: [facility]   Lisa Maxwell is a 77 y.o. female who is a Primary Care Patient of Paulina Fusi, MD- [facility]. The patient was screened for  readmission hospitalization with noted extreme risk score for unplanned readmission risk with 2 IP in 6 months.  The patient was assessed for potential Care Management service needs for post hospital transition for care coordination. Review of patient's electronic medical record reveals patient was admitted with Esophageal Dysphagia. Pt recommended for SNF level of care. Facility will continue to address pt's ongoing needs.    Plan: [facility] Liaison will continue to follow progress and disposition to asess for post hospital community care coordination/management needs.  Referral request for community care coordination: pending disposition.   [facility] Care Management/Population Health does not replace or interfere with any arrangements made by the Inpatient Transition of Care team.   For questions contact:   Elliot Cousin, RN, [facility] Liaison [place]   [facility], Population Health Office Hours MTWF  8:00 am-6:00 pm Direct Dial: [phone] mobile [phone] [Office toll free line] Office Hours are M-F 8:30 - 5 pm [email or web address]

## 2023-03-25 NOTE — Consult Note (Signed)
[facility] CONSULT NOTE  Patient Care Team: Paulina Fusi, MD as PCP - General (Internal Medicine) Revankar, Aundra Dubin, MD as PCP - Cardiology (Cardiology) Lisbeth Renshaw, MD as Consulting Physician (Neurosurgery)  ASSESSMENT & PLAN:  Right kidney mass, incidental finding, worrisome for undiagnosed renal cell carcinoma I was not able to pull previous imaging studies in comparison The patient is not symptomatic and with normal renal function Recommend urology consult Given her significant comorbidities I think this can potentially be observed as I do not believe she can tolerate nephrectomy Will wait for further recommendation by urologist  Dysphagia with severe protein calorie malnutrition Multifactorial, known to have esophageal stricture EGD report from yesterday was reviewed Will defer to primary service for evaluation and management  Aspiration pneumonia On antibiotics  Anemia chronic illness Her blood work is most suggestive of anemia chronic illness Observe closely  Possible delirium She does not appear to be fully oriented  The total time spent in the appointment was 80 minutes encounter with patients including review of chart and various tests results, discussions about plan of care and coordination of care plan   All questions were answered. The patient knows to call the clinic with any problems, questions or concerns. No barriers to learning was detected.  Artis Delay, MD 12/19/20242:30 PM  CHIEF COMPLAINTS/PURPOSE OF CONSULTATION:  Right kidney mass, worrisome for kidney cancer  HISTORY OF PRESENTING ILLNESS:  Lisa Maxwell 77 y.o. female is seen at the request by primary service The patient had debilitating chronic back pain as well as chronic pain syndrome  she has significant other co-morbidities She have significant background history of smoking but quit several years ago She is known to have esophageal stricture requiring dilation  and chronic dysphagia She was admitted 3 days ago due to worsening dysphagia, dehydration and signs of aspiration pneumonia.  She has recurrent ED visits and hospitalization for multiple different problems.  In July, she presented to the ER with gross hematuria.  She is known to have history of recurrent urinary tract infection She underwent CT imaging yesterday which shows signs of aspiration pneumonia but enhancing mass on the right kidney worrisome for primary renal cell carcinoma.  I am consulted for this I am not able to get much history from the patient as she does not appear to be fully oriented.  Her past medical history, past surgical history, social history, medication list and others were reviewed from electronic records  MEDICAL HISTORY:  Past Medical History:  Diagnosis Date   CAD (coronary artery disease) 11/04/2016   Cancer (HCC)    skin cancer   Cauda equina compression (HCC) 08/28/2021   Cerebrovascular disease    Chronic atrial fibrillation (HCC) 09/28/2018   Chronic bilateral low back pain without sciatica 03/08/2018   Chronic kidney disease    Chronic obstructive lung disease (HCC) 09/28/2018   Chronic pain syndrome 03/08/2018   Chronic renal failure 09/28/2018   Chronic, continuous use of opioids 03/04/2021   Last Assessment & Plan:  Formatting of this note might be different from the original. Patient and I have discussed the hazardous effects of continued opiate pain medication usage. Risks and benefits of above medications including but not limited to possibility of respiratory depression, sedation, and even death were discussed with the patient who expressed an understanding.  Patient did not displ   CKD (chronic kidney disease) stage 3, GFR 30-59 ml/min (HCC)    Class 1 obesity due to excess calories with serious  comorbidity and body mass index (BMI) of 34.0 to 34.9 in adult 05/20/2021   Last Assessment & Plan: Formatting of this note might be different from the  original. Patient educated about the detrimental effects of weight as it specifically pertains to pain management and overall health.  Patient's BMI 34.08 Encouraged healthy eating habits and routine low-impact cardiovascular exercises as tolerated.   Comprehensive diabetic foot examination, type 2 DM, encounter for (HCC) 04/29/2021   DDD (degenerative disc disease), lumbar 11/17/2018   Last Assessment & Plan:  Formatting of this note might be different from the original. See spinal stenosis plan   Diabetes (HCC)    Diabetes mellitus due to underlying condition with unspecified complications (HCC) 11/04/2016   Dyshidrotic eczema 04/29/2021   Dysrhythmia    afib   Essential hypertension 11/04/2016   Facet arthritis of lumbar region 03/08/2018   Facet joint disease 11/17/2018   Last Assessment & Plan:  Formatting of this note might be different from the original. See spinal stenosis plan   Foraminal stenosis of lumbar region 03/08/2018   Heart murmur    History of knee replacement, total, bilateral 11/17/2018   Last Assessment & Plan:  Formatting of this note might be different from the original. Have recommended to her several times if any further concerns in regards to the pain status post arthroplasty, she is to follow up with her surgeon.   History of pulmonary embolism 11/28/2021   Hypertension    Hypertensive disorder 09/28/2018   Increased body mass index 09/28/2018   Lumbar radiculopathy 03/08/2018   Lumbar spondylosis 03/20/2019   Mixed dyslipidemia 11/04/2016   Morbid obesity (HCC) 11/04/2016   Muscle pain 04/29/2020   Osteoarthritis of left knee    PAF (paroxysmal atrial fibrillation) (HCC) 04/30/2015   Pain management contract agreement 04/13/2019   Last Assessment & Plan:  Formatting of this note might be different from the original. Contract updated today.  UDS completed.  [place] controlled substance registry reviewed and is consistent with her regimen.   Peripheral  vascular disease (HCC) 09/28/2018   Preop cardiovascular exam 05/01/2019   Pressure injury of skin 11/21/2021   Pulmonary emboli [facility])    Pulmonary embolism (HCC) 11/21/2021   Renal insufficiency 04/30/2015   S/P insertion of spinal cord stimulator 07/13/2019   Last Assessment & Plan:  Formatting of this note might be different from the original. About 5 months status post insertion of spinal cord stimulator with excellent relief.  She continues to keep close follow-up with her representative and has been working to appropriately find best programs to get her adequate pain relief.   Sleep apnea    Smoker 09/28/2018   Spinal stenosis of lumbar region 03/20/2019   Spinal stenosis of lumbar region with neurogenic claudication 03/08/2018   Last Assessment & Plan:  Formatting of this note is different from the original. 77 year old female with chronic low back pain and bilateral, right greater than left, L4-5 radicular pain, and claudication. She has substantial amount of multifactorial degenerative thoracic and lumbar spine pathology.  After being deemed an inappropriate open surgical candidate, and failing to respond to multiple in   Spondylolisthesis 03/20/2019   Spondylosis of lumbosacral region without myelopathy or radiculopathy 01/30/2019   Last Assessment & Plan:  Formatting of this note might be different from the original. See spinal stenosis plan   Tinea unguium 02/03/2022   Type 2 diabetes mellitus (HCC) 09/28/2018    SURGICAL HISTORY: Past Surgical History:  Procedure Laterality Date  ABDOMINAL HYSTERECTOMY     BACK SURGERY     BIOPSY  06/18/2022   Procedure: BIOPSY;  Surgeon: Meridee Score Netty Starring., MD;  Location: [facility] ENDOSCOPY;  Service: Gastroenterology;;   ESOPHAGOGASTRODUODENOSCOPY (EGD) WITH PROPOFOL N/A 06/18/2022   Procedure: ESOPHAGOGASTRODUODENOSCOPY (EGD) WITH PROPOFOL;  Surgeon: Lemar Lofty., MD;  Location: [facility] ENDOSCOPY;  Service: Gastroenterology;   Laterality: N/A;   EYE SURGERY     bilateral cataracts   IR ANGIOGRAM PULMONARY BILATERAL SELECTIVE  11/21/2021   IR ANGIOGRAM SELECTIVE EACH ADDITIONAL VESSEL  11/21/2021   IR ANGIOGRAM SELECTIVE EACH ADDITIONAL VESSEL  11/21/2021   IR THROMBECT PRIM MECH INIT (INCLU) MOD SED  11/21/2021   IR US GUIDE VASC ACCESS RIGHT  11/21/2021   JOINT REPLACEMENT Bilateral    knees   RADIOLOGY WITH ANESTHESIA N/A 11/21/2021   Procedure: IR WITH ANESTHESIA;  Surgeon: Radiologist, Medication, MD;  Location: [facility] OR;  Service: Radiology;  Laterality: N/A;   SAVORY DILATION N/A 06/18/2022   Procedure: SAVORY DILATION;  Surgeon: Meridee Score Netty Starring., MD;  Location: [facility] ENDOSCOPY;  Service: Gastroenterology;  Laterality: N/A;   SPINAL CORD STIMULATOR INSERTION N/A 07/07/2019   Procedure: LUMBAR SPINAL CORD STIMULATOR INSERTION;  Surgeon: Odette Fraction, MD;  Location: [facility] OR;  Service: Neurosurgery;  Laterality: N/A;   total left knee      SOCIAL HISTORY: Social History   Socioeconomic History   Marital status: Married    Spouse name: Not on file   Number of children: 2   Years of education: Not on file   Highest education level: Not on file  Occupational History   Not on file  Tobacco Use   Smoking status: Some Days    Current packs/day: 0.00    Average packs/day: 0.5 packs/day for 62.0 years (31.0 ttl pk-yrs)    Types: Cigarettes    Start date: 05/07/1957    Last attempt to quit: 05/08/2019    Years since quitting: 3.8    Passive exposure: Past   Smokeless tobacco: Never  Vaping Use   Vaping status: Never Used  Substance and Sexual Activity   Alcohol use: No   Drug use: No   Sexual activity: Not Currently  Other Topics Concern   Not on file  Social History Narrative   Not on file   Social Drivers of Health   Financial Resource Strain: Low Risk  (07/31/2022)   Overall Financial Resource Strain (CARDIA)    Difficulty of Paying Living Expenses: Not hard at all  Food Insecurity: No Food  Insecurity (03/22/2023)   Hunger Vital Sign    Worried About Running Out of Food in the Last Year: Never true    Ran Out of Food in the Last Year: Never true  Transportation Needs: No Transportation Needs (03/22/2023)   PRAPARE - Administrator, Civil Service (Medical): No    Lack of Transportation (Non-Medical): No  Physical Activity: Unknown (07/31/2022)   Exercise Vital Sign    Days of Exercise per Week: Not on file    Minutes of Exercise per Session: 0 min  Stress: Not on file  Social Connections: Not on file  Intimate Partner Violence: Not At Risk (03/22/2023)   Humiliation, Afraid, Rape, and Kick questionnaire    Fear of Current or Ex-Partner: No    Emotionally Abused: No    Physically Abused: No    Sexually Abused: No    FAMILY HISTORY: Family History  Problem Relation Age of Onset   Diabetes Mother  ALLERGIES:  has no known allergies.  MEDICATIONS:  Current Facility-Administered Medications  Medication Dose Route Frequency Provider Last Rate Last Admin   0.9 %  sodium chloride infusion   Intravenous Continuous Annett Fabian, MD 100 mL/hr at 03/25/23 1149 New Bag at 03/25/23 1149   Ampicillin-Sulbactam (UNASYN) 3 g in sodium chloride 0.9 % 100 mL IVPB  3 g Intravenous Q8H Reymundo Poll, MD 200 mL/hr at 03/25/23 1155 3 g at 03/25/23 1155   apixaban (ELIQUIS) tablet 5 mg  5 mg Oral BID Reymundo Poll, MD   5 mg at 03/25/23 9147   Chlorhexidine Gluconate Cloth 2 % PADS 6 each  6 each Topical Daily Pyrtle, Carie Caddy, MD   6 each at 03/25/23 0914   fluticasone furoate-vilanterol (BREO ELLIPTA) 100-25 MCG/ACT 1-2 puff  1-2 puff Inhalation Daily Marrianne Mood, MD   1 puff at 03/25/23 0919   leptospermum manuka honey (MEDIHONEY) paste 1 Application  1 Application Topical Daily Reymundo Poll, MD   1 Application at 03/24/23 1811   LORazepam (ATIVAN) tablet 0.5 mg  0.5 mg Oral Once PRN Annett Fabian, MD       methocarbamol (ROBAXIN) tablet 250 mg  250  mg Oral TID Marrianne Mood, MD   250 mg at 03/25/23 8295   nystatin cream (MYCOSTATIN) 1 Application  1 Application Topical BID Marrianne Mood, MD   1 Application at 03/24/23 2106   oxyCODONE (Oxy IR/ROXICODONE) immediate release tablet 10 mg  10 mg Oral QID PRN Reymundo Poll, MD   10 mg at 03/24/23 0508   pantoprazole (PROTONIX) EC tablet 40 mg  40 mg Oral Daily Marrianne Mood, MD   40 mg at 03/25/23 0912   pravastatin (PRAVACHOL) tablet 40 mg  40 mg Oral q1800 Marrianne Mood, MD   40 mg at 03/24/23 1729   sodium chloride flush (NS) 0.9 % injection 3 mL  3 mL Intravenous PRN Marrianne Mood, MD        REVIEW OF SYSTEMS: Unable to assess due to delirium  PHYSICAL EXAMINATION: ECOG PERFORMANCE STATUS: 4 - Bedbound  Vitals:   03/25/23 0721 03/25/23 0919  BP: 115/63   Pulse:    Resp: 18   Temp: 98.5 F (36.9 C)   SpO2:  98%   Filed Weights   03/23/23 1331 03/24/23 0502 03/25/23 0428  Weight: 119 lb 0.8 oz (54 kg) 115 lb 8.3 oz (52.4 kg) 115 lb 8.3 oz (52.4 kg)    GENERAL:alert, no distress and comfortable.  She answers simple questions but does appear to be fully oriented.  She is on restraints SKIN: skin color, texture, turgor are normal, no rashes or significant lesions.  She looks somewhat pale EYES: normal, conjunctiva are pale and non-injected, sclera clear OROPHARYNX:no exudate, no erythema and lips, buccal mucosa, and tongue normal  NECK: supple, thyroid normal size, non-tender, without nodularity LYMPH:  no palpable lymphadenopathy in the cervical, axillary or inguinal LUNGS: clear to auscultation and percussion with normal breathing effort HEART: regular rate & rhythm and no murmurs and no lower extremity edema ABDOMEN:abdomen soft, non-tender and normal bowel sounds Musculoskeletal:no cyanosis of digits and no clubbing  PSYCH: alert & oriented x 3 with fluent speech NEURO: Limited examination as the patient is on restraints  LABORATORY DATA:  I have  reviewed the data as listed Lab Results  Component Value Date   WBC 7.1 03/25/2023   HGB 8.5 (L) 03/25/2023   HCT 26.2 (L) 03/25/2023   MCV 88.5 03/25/2023   PLT  311 03/25/2023   Recent Labs    12/03/22 0254 12/04/22 0543 12/04/22 2100 03/23/23 1343 03/24/23 0307 03/24/23 0628 03/25/23 0241  NA 132* 131*   < > 127* 129*  --  131*  K 4.2 5.9*   < > 4.1 3.9  --  3.6  CL 103 99   < > 94* 97*  --  99  CO2 24 22   < > 23 23  --  23  GLUCOSE 134* 188*   < > 117* 106*  --  78  BUN 14 19   < > 39* 26*  --  14  CREATININE 1.24* 1.26*   < > 1.07* 0.99  --  1.14*  CALCIUM 9.3 9.9   < > 9.9 9.6  --  9.5  GFRNONAA 45* 44*   < > 54* 59*  --  50*  PROT 5.2* 5.8*  --   --   --  5.3*  --   ALBUMIN 2.5* 2.8*  --   --   --  2.1*  --   AST 27 41  --   --   --  29  --   ALT 16 20  --   --   --  20  --   ALKPHOS 55 66  --   --   --  55  --   BILITOT 0.5 1.0  --   --   --  0.3  --   BILIDIR  --   --   --   --   --  <0.1  --   IBILI  --   --   --   --   --  NOT CALCULATED  --    < > = values in this interval not displayed.    RADIOGRAPHIC STUDIES: I have personally reviewed the radiological images as listed and agreed with the findings in the report. CT CHEST ABDOMEN PELVIS W CONTRAST Result Date: 03/25/2023 CLINICAL DATA:  Renal cell carcinoma, right lung nodule, weight loss, elevated serum lipase. * Tracking Code: BO * EXAM: CT CHEST, ABDOMEN, AND PELVIS WITH CONTRAST TECHNIQUE: Multidetector CT imaging of the chest, abdomen and pelvis was performed following the standard protocol during bolus administration of intravenous contrast. RADIATION DOSE REDUCTION: This exam was performed according to the departmental dose-optimization program which includes automated exposure control, adjustment of the mA and/or kV according to patient size and/or use of iterative reconstruction technique. CONTRAST:  75mL OMNIPAQUE IOHEXOL 350 MG/ML SOLN COMPARISON:  None Available. FINDINGS: CT CHEST FINDINGS  Cardiovascular: Extensive multi-vessel coronary artery calcification. Global cardiac size within normal limits. Central pulmonary arteries are enlarged in keeping with changes of pulmonary arterial hypertension. Mild atherosclerotic calcification within the thoracic aorta. No aortic aneurysm. No pericardial effusion. Mediastinum/Nodes: No enlarged mediastinal, hilar, or axillary lymph nodes. Thyroid gland, trachea, and esophagus demonstrate no significant findings. Lungs/Pleura: Multifocal ground-glass and consolidative infiltrates are seen scattered throughout the lungs bilaterally, most severe within the right upper lobe and right lower lobe in keeping with changes of multifocal pneumonia in the acute setting. No pneumothorax or pleural effusion. Bronchial wall thickening present in keeping with airway inflammation, most severe within the right lower lobe Musculoskeletal: Dorsal column stimulator leads in place with their tips at T6-T8. No acute bone abnormality. No lytic or blastic bone lesion. Osseous structures are age-appropriate. CT ABDOMEN PELVIS FINDINGS Hepatobiliary: No focal liver abnormality is seen. No gallstones, gallbladder wall thickening, or biliary dilatation. Pancreas: Unremarkable Spleen: Unremarkable Adrenals/Urinary Tract: The  adrenal glands are unremarkable. The kidneys are normal in position. Moderate bilateral renal cortical atrophy. A lobulated enhancing mass is seen within the medial interpolar region of the right kidney measuring at least 2.1 x 2.8 x 4.1 cm compatible with a primary renal cell carcinoma. Multiple simple cortical cysts are seen in the lower pole the left kidney for which no follow-up imaging is recommended. There is no hydronephrosis. No intrarenal or ureteral calculi are identified. Foley catheter balloon is seen within a decompressed bladder lumen. Stomach/Bowel: Severe descending and sigmoid colonic diverticulosis is present residual peri- contrast, however, limits  evaluation of the bowel due to streak artifact. Stomach, small bowel, and large bowel are otherwise unremarkable. Appendix normal. No free intraperitoneal gas or fluid. Vascular/Lymphatic: Aortic atherosclerosis. No enlarged abdominal or pelvic lymph nodes. Reproductive: Status post hysterectomy. No adnexal masses. Other: No abdominal wall hernia. Dorsal column stimulator battery pack seen within the right lumbar subcutaneous soft tissues. Musculoskeletal: Advanced degenerative changes are seen at L1-2. L2-3 lumbar fusion with instrumentation has been performed. No acute bone abnormality. No lytic blastic bone lesion. IMPRESSION: 1. Multifocal ground-glass and consolidative infiltrates scattered throughout the lungs bilaterally, most severe within the right upper lobe and right lower lobe in keeping with changes of multifocal pneumonia in the acute setting. 2. Extensive multi-vessel coronary artery calcification. 3. Morphologic changes in keeping with pulmonary arterial hypertension. 4. 4.1 cm lobulated enhancing mass within the medial interpolar region of the right kidney compatible with a primary renal cell carcinoma. No evidence of regional nodal or distant metastatic disease. 5. Severe descending and sigmoid colonic diverticulosis. Aortic Atherosclerosis (ICD10-I70.0). Electronically Signed   By: Helyn Numbers M.D.   On: 03/25/2023 03:02   CT HEAD WO CONTRAST ( ) Result Date: 03/24/2023 CLINICAL DATA:  Dysphagia EXAM: CT HEAD WITHOUT CONTRAST TECHNIQUE: Contiguous axial images were obtained from the base of the skull through the vertex without intravenous contrast. RADIATION DOSE REDUCTION: This exam was performed according to the departmental dose-optimization program which includes automated exposure control, adjustment of the mA and/or kV according to patient size and/or use of iterative reconstruction technique. COMPARISON:  None Available. FINDINGS: Brain: No mass, hemorrhage or extra-axial  collection. There is generalized volume loss. There is hypoattenuation of the bilateral supratentorial white matter. Old left occipital infarct. Vascular: There is atherosclerotic calcification of both internal carotid arteries at the skull base. Skull: Normal. Negative for fracture or focal lesion. Sinuses/Orbits: No acute finding. Other: None. IMPRESSION: 1. No acute intracranial abnormality. 2. Old left occipital infarct and findings of chronic small vessel ischemia. Electronically Signed   By: Deatra Robinson M.D.   On: 03/24/2023 02:30   DG Chest Port 1 View Result Date: 03/22/2023 CLINICAL DATA:  Weakness EXAM: PORTABLE CHEST 1 VIEW COMPARISON:  12/05/2022 FINDINGS: Ascending thoracic stimulator leads with similar positioning. Cardiac enlargement with mild central congestion. No pleural effusion. No pneumothorax. Ovoid right upper lung opacity. IMPRESSION: 1. Cardiac enlargement with mild central congestion. 2. Ovoid right upper lung opacity, this could represent acute pulmonary infiltrate with lung nodule not excluded. Short interval radiographic follow-up is recommended. Electronically Signed   By: Jasmine Pang M.D.   On: 03/22/2023 16:26

## 2023-03-25 NOTE — Progress Notes (Signed)
HD#3 SUBJECTIVE:  Patient Summary: Lisa Maxwell is a 77 y.o. female with a pertinent PMH of esophageal strictures, Schatzki's ring s/p dilation, prior saddle embolus (11/2021), atrial fibrillation on Eliquis, HFpEF, type 2 diabetes, CAD, and chronic low back pain s/p spinal cord stimulation insertion who presents with concerns for generalized weakness and poor oral intake and is admitted for concerns of aspiration pneumonia and hypovolemic hyponatremia secondary to esophageal dysphagia.   Overnight Events: Some delirium. Pulled out IV. Placed in mittens.   Interim History: Patient is somewhat confused this morning. Oriented to self, and knows she is in [place], but not oriented to place or situation. Spoke with nurse, suspect hospital delirium. She denies any coughing, shortness of breath.   OBJECTIVE:  Vital Signs: Vitals:   03/24/23 2349 03/25/23 0428 03/25/23 0721 03/25/23 0919  BP: (!) 96/49 108/65 115/63   Pulse: 88     Resp: 18 19 18    Temp: 98.3 F (36.8 C) 98.2 F (36.8 C) 98.5 F (36.9 C)   TempSrc: Oral Oral Oral   SpO2: 100% 99%  98%  Weight:  52.4 kg    Height:       Supplemental O2: Room Air SpO2: 98 %  Filed Weights   03/23/23 1331 03/24/23 0502 03/25/23 0428  Weight: 54 kg 52.4 kg 52.4 kg    Intake/Output Summary (Last 24 hours) at 03/25/2023 1133 Last data filed at 03/25/2023 0601 Gross per 24 hour  Intake 706.72 ml  Output 1150 ml  Net -443.28 ml   Net IO Since Admission: -3,159.27 mL [03/25/23 1133]  Physical Exam: General: laying in bed, in no acute distress; wearing mittens Cardiac: Afib, normal rate; no LE edema Pulmonary: normal rate and effort, no coughing observed Skin: stage 1 decubitus ulcer Neuro: Alert and oriented x 1 to self. Not oriented to place or situation.   Patient Lines/Drains/Airways Status     Active Line/Drains/Airways     Name Placement date Placement time Site Days   Peripheral IV 03/22/23 20 G Right Antecubital  03/22/23  1522  Antecubital  1   Peripheral IV 03/22/23 20 G Anterior;Left;Proximal Forearm 03/22/23  1755  Forearm  1   Urethral Catheter Gerardo A, RN Double-lumen 14 Fr. 12/01/22  1320  Double-lumen  112   Pressure Injury 11/21/21 Buttocks Left;Medial Stage 2 -  Partial thickness loss of dermis presenting as a shallow open injury with a red, pink wound bed without slough. 11/21/21  1112  -- 487           Pertinent Labs:    Latest Ref Rng & Units 03/25/2023    2:41 AM 03/24/2023    3:07 AM 03/23/2023    7:38 AM  CBC  WBC 4.0 - 10.5 K/uL 7.1  6.6  9.0   Hemoglobin 12.0 - 15.0 g/dL 8.5  9.2  9.3   Hematocrit 36.0 - 46.0 % 26.2  27.8  27.9   Platelets 150 - 400 K/uL 311  320  351       Latest Ref Rng & Units 03/25/2023    2:41 AM 03/24/2023    6:28 AM 03/24/2023    3:07 AM  CMP  Glucose 70 - 99 mg/dL 78   098   BUN 8 - 23 mg/dL 14   26   Creatinine 1.19 - 1.00 mg/dL 1.47   8.29   Sodium 562 - 145 mmol/L 131   129   Potassium 3.5 - 5.1 mmol/L 3.6   3.9  Chloride 98 - 111 mmol/L 99   97   CO2 22 - 32 mmol/L 23   23   Calcium 8.9 - 10.3 mg/dL 9.5   9.6   Total Protein 6.5 - 8.1 g/dL  5.3    Total Bilirubin <1.2 mg/dL  0.3    Alkaline Phos 38 - 126 U/L  55    AST 15 - 41 U/L  29    ALT 0 - 44 U/L  20     Recent Labs    03/24/23 1039  GLUCAP 98     Pertinent Imaging: CT CHEST ABDOMEN PELVIS W CONTRAST Result Date: 03/25/2023 CLINICAL DATA:  Renal cell carcinoma, right lung nodule, weight loss, elevated serum lipase. * Tracking Code: BO * EXAM: CT CHEST, ABDOMEN, AND PELVIS WITH CONTRAST TECHNIQUE: Multidetector CT imaging of the chest, abdomen and pelvis was performed following the standard protocol during bolus administration of intravenous contrast. RADIATION DOSE REDUCTION: This exam was performed according to the departmental dose-optimization program which includes automated exposure control, adjustment of the mA and/or kV according to patient size and/or use of  iterative reconstruction technique. CONTRAST:  75mL OMNIPAQUE IOHEXOL 350 MG/ML SOLN COMPARISON:  None Available. FINDINGS: CT CHEST FINDINGS Cardiovascular: Extensive multi-vessel coronary artery calcification. Global cardiac size within normal limits. Central pulmonary arteries are enlarged in keeping with changes of pulmonary arterial hypertension. Mild atherosclerotic calcification within the thoracic aorta. No aortic aneurysm. No pericardial effusion. Mediastinum/Nodes: No enlarged mediastinal, hilar, or axillary lymph nodes. Thyroid gland, trachea, and esophagus demonstrate no significant findings. Lungs/Pleura: Multifocal ground-glass and consolidative infiltrates are seen scattered throughout the lungs bilaterally, most severe within the right upper lobe and right lower lobe in keeping with changes of multifocal pneumonia in the acute setting. No pneumothorax or pleural effusion. Bronchial wall thickening present in keeping with airway inflammation, most severe within the right lower lobe Musculoskeletal: Dorsal column stimulator leads in place with their tips at T6-T8. No acute bone abnormality. No lytic or blastic bone lesion. Osseous structures are age-appropriate. CT ABDOMEN PELVIS FINDINGS Hepatobiliary: No focal liver abnormality is seen. No gallstones, gallbladder wall thickening, or biliary dilatation. Pancreas: Unremarkable Spleen: Unremarkable Adrenals/Urinary Tract: The adrenal glands are unremarkable. The kidneys are normal in position. Moderate bilateral renal cortical atrophy. A lobulated enhancing mass is seen within the medial interpolar region of the right kidney measuring at least 2.1 x 2.8 x 4.1 cm compatible with a primary renal cell carcinoma. Multiple simple cortical cysts are seen in the lower pole the left kidney for which no follow-up imaging is recommended. There is no hydronephrosis. No intrarenal or ureteral calculi are identified. Foley catheter balloon is seen within a  decompressed bladder lumen. Stomach/Bowel: Severe descending and sigmoid colonic diverticulosis is present residual peri- contrast, however, limits evaluation of the bowel due to streak artifact. Stomach, small bowel, and large bowel are otherwise unremarkable. Appendix normal. No free intraperitoneal gas or fluid. Vascular/Lymphatic: Aortic atherosclerosis. No enlarged abdominal or pelvic lymph nodes. Reproductive: Status post hysterectomy. No adnexal masses. Other: No abdominal wall hernia. Dorsal column stimulator battery pack seen within the right lumbar subcutaneous soft tissues. Musculoskeletal: Advanced degenerative changes are seen at L1-2. L2-3 lumbar fusion with instrumentation has been performed. No acute bone abnormality. No lytic blastic bone lesion. IMPRESSION: 1. Multifocal ground-glass and consolidative infiltrates scattered throughout the lungs bilaterally, most severe within the right upper lobe and right lower lobe in keeping with changes of multifocal pneumonia in the acute setting. 2. Extensive multi-vessel coronary artery  calcification. 3. Morphologic changes in keeping with pulmonary arterial hypertension. 4. 4.1 cm lobulated enhancing mass within the medial interpolar region of the right kidney compatible with a primary renal cell carcinoma. No evidence of regional nodal or distant metastatic disease. 5. Severe descending and sigmoid colonic diverticulosis. Aortic Atherosclerosis (ICD10-I70.0). Electronically Signed   By: Helyn Numbers M.D.   On: 03/25/2023 03:02    ASSESSMENT/PLAN:  Assessment: Principal Problem:   Esophageal dysphagia Active Problems:   COPD (chronic obstructive pulmonary disease) (HCC)   Decubitus skin ulcer   Aspiration pneumonia (HCC)   Hyponatremia   Chronic CHF (congestive heart failure) (HCC)   Atrial fibrillation with RVR (HCC)   GI bleeding  Lisa Maxwell is a 77 y.o. person living with a history of esophageal strictures s/p dilation procedures,  prior saddle embolus (11/2021), atrial fibrillation on Eliquis, HFpEF, type 2 diabetes, CAD, and chronic low back pain s/p spinal cord stimulation insertion who presents with concerns for generalized weakness and poor oral intake and is admitted for aspiration pneumonia on hospital day 1.  Plan:  Aspiration pneumonia 2/2 esophageal +/- oropharyngeal dysphagia History of esophageal strictures, non-obstructive Schatzki's ring s/p dilation Schatzki's ring dilated on EGD yesterday. Speech evaluated this morning and noted increased coughing on thin liquids. CT chest showed findings consistent with multifocal pneumonia. Blood cultures with no growth after 48 hours. Clinically improving from a respiratory standpoint, but since her dysphagia seems to have not improved much following the esophageal dilation, we will continue with further workup of oropharyngeal contributions to her presentation.  - Speech following, appreciate recs; MBS planned today - continue Unasyn - liquid diet for now, with assistance from staff/family, holding if coughing persists; meds crushed in puree, per speech recs  Right kidney mass, suspected primary RCC Unintentional weight loss Patient has lost a significant amount of weight over the past six months, unintentionally. Some may be due to poor oral intake and general deconditioning, however given her tobacco use, nausea, and elevated lipase, we were concerned about GI/GU pathology, so we ordered a CT abdomen/pelvis. This showed a 4.1 cm mass in right kidney, concerning for primary RCC. - Oncology consulted; recommended urology consult  Atrial fibrillation, on anticoagulation HFpEF (EF 55-60% in 11/2022) Still in atrial fibrillation this morning. Blood pressure stable. No acute concerns.  - Restarted eliquis 5 mg BID - Holding spironolactone, lasix given hypotension   Hyponatremia, resolved Dehydration in the setting of poor solute intake Hypotension, improved Sodium 131, up  from 129 yesterday. Continues to improve with IV fluids. We will continue repleting her volume today. BP has been stable.  - Continue maintenance fluids, 100cc/hr NS  Normocytic anemia History of posterior anal fissure and internal hemorrhoids Hemoglobin 8.5 today, down from 9.2 yesterday. Suspect hemodilution. Will continue to monitor.  - recheck CBC tomorrow AM   Chronic conditions: COPD: on room air at home; uses breo ellipta and albuterol inhalers at home, restarted. Chronic back pain s/p spinal cord stimulator: taking oxycodone 10 mg QID as needed, robaxin 500 mg TID, reordered. Takes gabapentin 600 mg at bedtime, held.  Type 2 diabetes mellitus: on Rybelsus 3 mg daily, held. A1c 6.1 nine months ago. CAD/HLD: on lovastatin 40 mg daily, reordered. Takes nitro 0.4 mg prn at home, held. GERD: takes protonix 40 mg daily at home, reordered.  Intertrigo: has fungal rash under breasts and inguinal areas, uses nystatin cream at home, reordered. Prior left occipital lobe infarct: old infarct seen on CT non-contrast   Diet: Clears  VTE: Eliquis 5 mg BID IVF: NS 100cc/hr Code: Full  Best Practice: Diet: Clear liquid diet IVF: Fluids: NS, Rate: 100cc/hr VTE: Eliquis 5 mg BID Code: Full Therapy Recs: Pending DISPO: Anticipated discharge in 2-4 days to Skilled nursing facility pending medical workup, treatment.  Signature: Annett Fabian, MD  Internal Medicine Resident, PGY-1 [facility]  Pager: [phone] 11:33 AM, 03/25/2023   Please contact the on call pager after 5 pm and on weekends at [phone].

## 2023-03-26 ENCOUNTER — Encounter (HOSPITAL_COMMUNITY): Payer: Self-pay | Admitting: Internal Medicine

## 2023-03-26 ENCOUNTER — Other Ambulatory Visit (HOSPITAL_COMMUNITY): Payer: Self-pay

## 2023-03-26 DIAGNOSIS — E440 Moderate protein-calorie malnutrition: Secondary | ICD-10-CM

## 2023-03-26 DIAGNOSIS — J690 Pneumonitis due to inhalation of food and vomit: Secondary | ICD-10-CM | POA: Diagnosis not present

## 2023-03-26 DIAGNOSIS — E871 Hypo-osmolality and hyponatremia: Secondary | ICD-10-CM | POA: Diagnosis not present

## 2023-03-26 DIAGNOSIS — R1310 Dysphagia, unspecified: Secondary | ICD-10-CM | POA: Diagnosis not present

## 2023-03-26 DIAGNOSIS — I4891 Unspecified atrial fibrillation: Secondary | ICD-10-CM | POA: Diagnosis not present

## 2023-03-26 HISTORY — DX: Moderate protein-calorie malnutrition: E44.0

## 2023-03-26 LAB — BASIC METABOLIC PANEL
Anion gap: 7 (ref 5–15)
BUN: 11 mg/dL (ref 8–23)
CO2: 23 mmol/L (ref 22–32)
Calcium: 9.3 mg/dL (ref 8.9–10.3)
Chloride: 103 mmol/L (ref 98–111)
Creatinine, Ser: 0.76 mg/dL (ref 0.44–1.00)
GFR, Estimated: >60 mL/min (ref >60)
Glucose, Bld: 142 mg/dL — ABNORMAL HIGH (ref 70–99)
Potassium: 3.5 mmol/L (ref 3.5–5.1)
Sodium: 133 mmol/L — ABNORMAL LOW (ref 135–145)

## 2023-03-26 LAB — CBC
HCT: 25.6 % — ABNORMAL LOW (ref 36.0–46.0)
Hemoglobin: 8.4 g/dL — ABNORMAL LOW (ref 12.0–15.0)
MCH: 28.6 pg (ref 26.0–34.0)
MCHC: 32.8 g/dL (ref 30.0–36.0)
MCV: 87.1 fL (ref 80.0–100.0)
Platelets: 312 10*3/uL (ref 150–400)
RBC: 2.94 MIL/uL — ABNORMAL LOW (ref 3.87–5.11)
RDW: 15.3 % (ref 11.5–15.5)
WBC: 6.2 10*3/uL (ref 4.0–10.5)
nRBC: 0 % (ref 0.0–0.2)

## 2023-03-26 MED ORDER — ADULT MULTIVITAMIN LIQUID CH
15.0000 mL | Freq: Every day | ORAL | Status: DC
  Administered 2023-03-26 – 2023-04-01 (×7): 15 mL via ORAL
  Filled 2023-03-26 (×9): qty 15

## 2023-03-26 NOTE — Plan of Care (Signed)
  Problem: Clinical Measurements: Goal: Will remain free from infection Outcome: Progressing Goal: Respiratory complications will improve Outcome: Progressing Goal: Cardiovascular complication will be avoided Outcome: Progressing   Problem: Elimination: Goal: Will not experience complications related to bowel motility Outcome: Progressing   Problem: Health Behavior/Discharge Planning: Goal: Ability to manage health-related needs will improve Outcome: Not Progressing   Problem: Nutrition: Goal: Adequate nutrition will be maintained Outcome: Not Progressing

## 2023-03-26 NOTE — Progress Notes (Signed)
4:09PM: Ins Berkley Harvey is pending. Auth ID: [id number]   Johnnette Gourd, MSW, LCSWA Transitions of Care [phone]

## 2023-03-26 NOTE — Progress Notes (Signed)
Speech Language Pathology Treatment: Dysphagia  Patient Details Name: Lisa Maxwell MRN: [id number] DOB: [birth year] Today's Date: 03/26/2023 Time: 4098-1191 SLP Time Calculation (min) (ACUTE ONLY): 36 min  Assessment / Plan / Recommendation Clinical Impression  Pt seems to be more confused today and husband agrees. He says she still seemed to do well with her breakfast tray. He had already read some of the recommendations from [facility] report but SLP did provide additional education about findings, recommendations, and POC. SLP offered nectar thick liquids via spoon, as pt would withdraw from cup when presented by SLP and did not engage in any self-feeding despite hand-over-hand assist. She would open her mouth at the sight of the spoon, but needed tactile cues to close her lips around it. No overt s/s of aspiration are observed, although it is important to note that her aspiration was silent and therefore strategies should still be carefully monitored.    HPI HPI: Pt is a 77 yo female presenting with generalized weakness and poor oral intake. Per MD H&P, husband reports 2-3 weeks of trouble swallowing, described as food feeling stuck in her throat (consistent with previous stricture), regurgitation with inability to keep food down, and coughing "sometimes" with thin liquids. Pt had swallow studies at [facility] the week PTA . Full report not available but per husband, she could swallow limited amounts on MBS fine, but after the applesauce she started to regurgitate. He said the SLP described it as a "clogged drain" through which limited amounts were passing, but this was not seen on esopahgram later that week. Pt was previously seen by SLP at [facility] in March 2024 with clinical presentation improved after esophageal dilation; no s/s of aspiration. MRI 12/19 negative for acute findings. PMH includes: esophageal stricture s/p dilation procedures, prior saddle embolus, afib, HFpEF, DMII, CAD, chronic low  back pain s/p spinal cord stimulator      SLP Plan  Continue with current plan of care      Recommendations for follow up therapy are one component of a multi-disciplinary discharge planning process, led by the attending physician.  Recommendations may be updated based on patient status, additional functional criteria and insurance authorization.    Recommendations  Diet recommendations: Nectar-thick liquid (full liquid diet) Liquids provided via: Teaspoon;Cup;No straw Medication Administration: Crushed with puree Supervision: Staff to assist with self feeding;Full supervision/cueing for compensatory strategies Compensations: Slow rate;Small sips/bites;Other (Comment) (one sip at a time) Postural Changes and/or Swallow Maneuvers: Seated upright 90 degrees;Upright 30-60 min after meal                  Oral care BID   Frequent or constant Supervision/Assistance Dysphagia, pharyngeal phase (R13.13)     Continue with current plan of care     Mahala Menghini., M.A. CCC-SLP Acute Rehabilitation Services Office [phone]  Secure chat preferred   03/26/2023, 11:18 AM

## 2023-03-26 NOTE — Progress Notes (Signed)
Occupational Therapy Treatment Patient Details Name: Lisa Maxwell MRN: [id number] DOB: [birth year] Today's Date: 03/26/2023   History of present illness Pt is a 77 yo female presents 12/16 with generalized weakness and poor oral intake and is admitted for concerns of aspiration pneumonia and hypovolemic hyponatremia secondary to esophageal dysphagia.Found to have aspiration PNA, hyponatremia, in Afib, dehydration and GI bleed  PMH of esophageal strictures s/p dilation procedures, prior saddle embolus (11/2021), atrial fibrillation on Eliquis, HFpEF, type 2 diabetes, CAD, and chronic low back pain s/p spinal cord stimulation insertion AD, cerebrovascular disease, a-fib, COPD, chronic pain syndrome, renal failure, DDD, HTN, bil TKA, PVD, spinal stenosis lumbar spine with neurogenic claudication, cauda equina compression CAD CKD   OT comments  OT session focused on self-feeding and B UE therapeutic exercises (see details below) to increase strength and activity tolerance, maintain ROM, improve coordination, and improve sequencing ability for carryover to functional tasks. Pt currently demonstrates ability to hold and drink from a cup with nectar thick water with Mod assist, increased time, and cues for sequencing and swallowing. Pt participated well in session and is making progress toward OT goals. Pt will benefit from continued acute skilled OT services to address deficits outlined below, decrease caregiver burden, and increase safety and independence with functional tasks. Post acute discharge, pt will benefit from intensive inpatient skilled rehab services < 3 hours per day to maximize rehab potential.       If plan is discharge home, recommend the following:  Two people to help with walking and/or transfers;A lot of help with bathing/dressing/bathroom;Assistance with cooking/housework;Assistance with feeding;Assist for transportation;Direct supervision/assist for medications management;Direct  supervision/assist for financial management;Help with stairs or ramp for entrance;Supervision due to cognitive status   Equipment Recommendations  Other (comment) (defer to next level of care)    Recommendations for Other Services      Precautions / Restrictions Precautions Precautions: Fall Restrictions Weight Bearing Restrictions Per Provider Order: No       Mobility Bed Mobility Overal bed mobility: Needs Assistance             General bed mobility comments: pt declined bed mobility this session    Transfers                         Balance                                           ADL either performed or assessed with clinical judgement   ADL Overall ADL's : Needs assistance/impaired Eating/Feeding: Minimal assistance;Moderate assistance;Cueing for safety;Cueing for sequencing;Bed level (with HOB elevated) Eating/Feeding Details (indicate cue type and reason): for drinking nectar thick water from cup                                        Extremity/Trunk Assessment Upper Extremity Assessment Upper Extremity Assessment: Left hand dominant;Generalized weakness;LUE deficits/detail;RUE deficits/detail RUE Deficits / Details: areas of bruising noted on forearm and hand; impaired sensation; decreased coordination, generalized weakness RUE Sensation:  (pt pulling back from light touch on B forearms as if painful but pt unable to describe) RUE Coordination: decreased fine motor;decreased gross motor LUE Deficits / Details: Per pt's husband, pt with hx of rotator cuff injury and sx with limited shoulder  AROM at baseline; areas of bruising noted on forearm; decreased coordination; generalized weakness LUE Sensation:  (pt pulling back from light touch on B forearms as if painful but pt unable to describe) LUE Coordination: decreased fine motor;decreased gross motor   Lower Extremity Assessment Lower Extremity Assessment: Defer to  PT evaluation        Vision   Additional Comments: Pt with preference for Right side gaze this session, requiring tactile and verbal cues for visual attention to Left side.   Perception     Praxis      Cognition Arousal: Alert Behavior During Therapy: WFL for tasks assessed/performed Overall Cognitive Status: Impaired/Different from baseline Area of Impairment: Orientation, Attention, Memory, Following commands, Safety/judgement, Awareness, Problem solving                 Orientation Level: Disoriented to, Place, Time, Situation Current Attention Level: Focused Memory: Decreased recall of precautions, Decreased short-term memory Following Commands: Follows one step commands inconsistently, Follows one step commands with increased time (Follows 1-step commands with cues) Safety/Judgement: Decreased awareness of safety, Decreased awareness of deficits Awareness: Intellectual, Emergent Problem Solving: Slow processing, Decreased initiation, Difficulty sequencing, Requires verbal cues, Requires tactile cues          Exercises Exercises: General Upper Extremity General Exercises - Upper Extremity Shoulder Flexion: AAROM, Strengthening, Both, 10 reps, Supine (maintain ROM; increased coordination; with HOB elevated, encouragement for active participation, and with visual and tactile cues for technique) Shoulder Extension: AAROM, Strengthening, Both, 10 reps, Supine (maintain ROM; increased coordination; with HOB elevated, encouragement for active participation, and with visual and tactile cues for technique) Shoulder ABduction: AAROM, Strengthening, Both, 10 reps, Supine (maintain ROM; increased coordination; with HOB elevated, encouragement for active participation, and with visual and tactile cues for technique) Shoulder ADduction: AAROM, Strengthening, Both, 10 reps, Supine (maintain ROM; increased coordination; with HOB elevated, encouragement for active participation, and with  visual and tactile cues for technique) Elbow Flexion: AAROM, Strengthening, Both, 10 reps, Supine (maintain ROM; increased coordination; with HOB elevated, encouragement for active participation, and with visual and tactile cues for technique) Elbow Extension: AROM, Strengthening, Both, 10 reps, Supine (maintain ROM; increased coordination; with HOB elevated, encouragement for active participation, and with visual and tactile cues for technique) Digit Composite Flexion: AAROM, Strengthening, Both, 10 reps, Supine (maintain ROM; increased coordination; with HOB elevated, encouragement for active participation, and with visual and tactile cues for technique) Composite Extension: AAROM, Strengthening, Both, 10 reps, Supine (maintain ROM; increased coordination; with HOB elevated, encouragement for active participation, and with visual and tactile cues for technique)    Shoulder Instructions       General Comments Pt's husband present throughout session. RN and DIetician present during a portion of session.    Pertinent Vitals/ Pain       Pain Assessment Pain Assessment: Faces Faces Pain Scale: Hurts a little bit Pain Location: generalized with movement Pain Descriptors / Indicators: Discomfort, Grimacing Pain Intervention(s): Limited activity within patient's tolerance, Monitored during session, Repositioned  Home Living                                          Prior Functioning/Environment              Frequency  Min 1X/week        Progress Toward Goals  OT Goals(current goals can now be found in the care  plan section)  Progress towards OT goals: Progressing toward goals  Acute Rehab OT Goals Patient Stated Goal: Pt did not statestate  Plan      Co-evaluation                 AM-PAC OT "6 Clicks" Daily Activity     Outcome Measure   Help from another person eating meals?: A Lot Help from another person taking care of personal grooming?: A  Lot Help from another person toileting, which includes using toliet, bedpan, or urinal?: Total Help from another person bathing (including washing, rinsing, drying)?: A Lot Help from another person to put on and taking off regular upper body clothing?: A Lot Help from another person to put on and taking off regular lower body clothing?: Total 6 Click Score: 10    End of Session    OT Visit Diagnosis: Muscle weakness (generalized) (M62.81);Ataxia, unspecified (R27.0);Other (comment) (decreased activity tolerance)   Activity Tolerance Patient tolerated treatment well;Other (comment) (Pt limited by current cognitive status)   Patient Left in bed;with call bell/phone within reach;with family/visitor present;Other (comment) (with Dietian present)   Nurse Communication Mobility status        Time: 1610-9604 OT Time Calculation (min): 25 min  Charges: OT General Charges $OT Visit: 1 Visit OT Treatments $Self Care/Home Management : 8-22 mins $Therapeutic Exercise: 8-22 mins  Skyy Mcknight "Orson Eva., OTR/L, MA Acute Rehab [phone]   Lendon Colonel 03/26/2023, 3:42 PM

## 2023-03-26 NOTE — Plan of Care (Signed)

## 2023-03-26 NOTE — Progress Notes (Addendum)
Initial Nutrition Assessment  DOCUMENTATION CODES:   Non-severe (moderate) malnutrition in context of chronic illness  INTERVENTION:   Calorie count x 48 hours over the weekend. Magic cup TID with meals, each supplement provides 290 kcal and 9 grams of protein. Mighty Shake TID with meals, each supplement provides 330 kcals and 9 grams of protein. Nursing assistance with meals.  Liquid MVI daily.  NUTRITION DIAGNOSIS:   Moderate Malnutrition related to chronic illness (dysphagia) as evidenced by mild muscle depletion, percent weight loss (23% weight loss within one year).  GOAL:   Patient will meet greater than or equal to 90% of their needs  MONITOR:   PO intake, Supplement acceptance  REASON FOR ASSESSMENT:   Consult Calorie Count  ASSESSMENT:   77 yo female admitted with esophageal dysphagia. PMH includes esophageal strictures s/p dilation procedures, HTN, DM, CVD, CKD, skin cancer, CAD, A fib, PVD, DDD, HLD.  Spoke with patient and her husband at bedside. Husband provided history. Patient somewhat confused today. She has been eating less and less at home d/t difficulty swallowing. She has lost a lot of weight recently, unsure of exact amount. She has been eating well today. She had nectar thick milk, chocolate pudding and nectar thick apple juice for breakfast. For lunch, she ate chocolate and vanilla pudding, yogurt, and nectar thick milk.  Yesterday, she had clear liquids. Husband reports that patient is unable to feed herself.   S/P MBS with SLP and patient is now on nectar thick full liquids.   Calorie count ordered. From husband's recall of breakfast, she had 370 kcal and 11 gm protein. For lunch she had 360 kcal and 24 gm protein.   RD to add magic cup and nectar thick mighty shake supplements to maximize kcal and protein provision.   Labs reviewed. Na 133 Medications reviewed.  Weight history reviewed. Patient with 23% weight loss within the past 12 months,  20% of that was within the past 3.5 months.   NUTRITION - FOCUSED PHYSICAL EXAM:  Flowsheet Row Most Recent Value  Orbital Region No depletion  Upper Arm Region No depletion  Thoracic and Lumbar Region No depletion  Buccal Region No depletion  Temple Region Mild depletion  Clavicle Bone Region Mild depletion  Clavicle and Acromion Bone Region Mild depletion  Scapular Bone Region Unable to assess  Dorsal Hand Mild depletion  Patellar Region Unable to assess  Anterior Thigh Region Unable to assess  Posterior Calf Region Unable to assess  Edema (RD Assessment) Unable to assess  Hair Reviewed  Eyes Reviewed  Mouth Reviewed  Skin Reviewed  Nails Reviewed       Diet Order:   Diet Order             Diet full liquid Room service appropriate? No; Fluid consistency: Nectar Thick  Diet effective now                   EDUCATION NEEDS:   No education needs have been identified at this time  Skin:  Skin Assessment: Skin Integrity Issues: Skin Integrity Issues:: DTI, Other (Comment), Unstageable DTI: L heel Unstageable: R ankle Other: multiple skin tears; MASD to breast, groin, buttocks  Last BM:  12/19  Height:   Ht Readings from Last 1 Encounters:  03/23/23 4\' 8"  (1.422 m)    Weight:   Wt Readings from Last 1 Encounters:  03/26/23 52.6 kg    Ideal Body Weight:  42.4 kg  BMI:  Body mass index is  26 kg/m.  Estimated Nutritional Needs:   Kcal:  1400-1600  Protein:  70-85 gm  Fluid:  1.4-1.6 L   Gabriel Rainwater RD, LDN, CNSC Please refer to Amion for contact information.

## 2023-03-26 NOTE — Progress Notes (Addendum)
HD#4 SUBJECTIVE:  Patient Summary: Lisa Maxwell is a 77 y.o. female with a pertinent PMH of esophageal strictures, Schatzki's ring s/p dilation, prior saddle embolus (11/2021), atrial fibrillation on Eliquis, HFpEF, type 2 diabetes, CAD, and chronic low back pain s/p spinal cord stimulation insertion who presents with concerns for generalized weakness and poor oral intake and is admitted for concerns of aspiration pneumonia and hypovolemic hyponatremia secondary to esophageal dysphagia.   Overnight Events: None  Interim History: Remains confused this morning. Not oriented to self or situation, delirious on presentation. Still wearing mittens. Denies any pain or discomfort.   OBJECTIVE:  Vital Signs: Vitals:   03/26/23 0424 03/26/23 0545 03/26/23 0745 03/26/23 0858  BP: 115/66  114/69   Pulse: 88  88   Resp: 14  13   Temp: 98.4 F (36.9 C)  98.8 F (37.1 C)   TempSrc: Axillary  Axillary   SpO2: 96%  100% 99%  Weight:  52.6 kg    Height:       Supplemental O2: Room Air SpO2: 99 %  Filed Weights   03/24/23 0502 03/25/23 0428 03/26/23 0545  Weight: 52.4 kg 52.4 kg 52.6 kg    Intake/Output Summary (Last 24 hours) at 03/26/2023 1054 Last data filed at 03/26/2023 0400 Gross per 24 hour  Intake 355.57 ml  Output 900 ml  Net -544.43 ml   Net IO Since Admission: -3,703.7 mL [03/26/23 1054]  Physical Exam: General: laying in bed, in no acute distress; wearing mittens; exam limited by delirium Cardiac: still in Afib, borderline tachycardic; BP stable Pulmonary: normal rate and effort Neuro: Delirious, not oriented to self, place, or situation. Waxing attention.   Patient Lines/Drains/Airways Status     Active Line/Drains/Airways     Name Placement date Placement time Site Days   Peripheral IV 03/22/23 20 G Right Antecubital 03/22/23  1522  Antecubital  1   Peripheral IV 03/22/23 20 G Anterior;Left;Proximal Forearm 03/22/23  1755  Forearm  1   Urethral Catheter Gerardo A,  RN Double-lumen 14 Fr. 12/01/22  1320  Double-lumen  112   Pressure Injury 11/21/21 Buttocks Left;Medial Stage 2 -  Partial thickness loss of dermis presenting as a shallow open injury with a red, pink wound bed without slough. 11/21/21  1112  -- 487           Pertinent Labs:    Latest Ref Rng & Units 03/26/2023    2:59 AM 03/25/2023    2:41 AM 03/24/2023    3:07 AM  CBC  WBC 4.0 - 10.5 K/uL 6.2  7.1  6.6   Hemoglobin 12.0 - 15.0 g/dL 8.4  8.5  9.2   Hematocrit 36.0 - 46.0 % 25.6  26.2  27.8   Platelets 150 - 400 K/uL 312  311  320       Latest Ref Rng & Units 03/26/2023    2:59 AM 03/25/2023    2:41 AM 03/24/2023    6:28 AM  CMP  Glucose 70 - 99 mg/dL 130  78    BUN 8 - 23 mg/dL 11  14    Creatinine 8.65 - 1.00 mg/dL 7.84  6.96    Sodium 295 - 145 mmol/L 133  131    Potassium 3.5 - 5.1 mmol/L 3.5  3.6    Chloride 98 - 111 mmol/L 103  99    CO2 22 - 32 mmol/L 23  23    Calcium 8.9 - 10.3 mg/dL 9.3  9.5  Total Protein 6.5 - 8.1 g/dL   5.3   Total Bilirubin <1.2 mg/dL   0.3   Alkaline Phos 38 - 126 U/L   55   AST 15 - 41 U/L   29   ALT 0 - 44 U/L   20    Recent Labs    03/24/23 1039  GLUCAP 98     Pertinent Imaging: MR BRAIN WO CONTRAST Result Date: 03/26/2023 CLINICAL DATA:  Renal cell carcinoma.  Dysphagia and confusion. EXAM: MRI HEAD WITHOUT CONTRAST TECHNIQUE: Multiplanar, multiecho pulse sequences of the brain and surrounding structures were obtained without intravenous contrast. COMPARISON:  None Available. FINDINGS: Truncated and motion degraded examination consisting of diffusion weighted imaging, FLAIR, axial T2 and axial GRE. There is no acute infarct. Mild generalized volume loss. No acute or chronic hemorrhage. There is multifocal hyperintense T2-weighted signal within the periventricular and deep white matter. Otherwise markedly limited examination. Carotid flow voids are normal. Bilateral ocular lens replacements. Paranasal sinuses are clear.  IMPRESSION: 1. Truncated and motion degraded examination consisting of diffusion weighted imaging, FLAIR, axial T2 and axial GRE. 2. No acute intracranial abnormality. 3. Findings of chronic small vessel ischemia and volume loss. Electronically Signed   By: Deatra Robinson M.D.   On: 03/26/2023 02:45   DG Swallowing Func-Speech Pathology Result Date: 03/25/2023 Table formatting from the original result was not included. Modified Barium Swallow Study Patient Details Name: Lisa Maxwell MRN: [id number] Date of Birth: July 06, 1945 Today's Date: 03/25/2023 HPI/PMH: HPI: Pt is a 77 yo female presenting with generalized weakness and poor oral intake. Per MD H&P, husband reports 2-3 weeks of trouble swallowing, described as food feeling stuck in her throat (consistent with previous stricture), regurgitation with inability to keep food down, and coughing "sometimes" with thin liquids. Pt had swallow studies at [facility] the week PTA . Full report not available but per husband, she could swallow limited amounts on MBS fine, but after the applesauce she started to regurgitate. He said the SLP described it as a "clogged drain" through which limited amounts were passing, but this was not seen on esopahgram later that week. Pt was previously seen by SLP at [facility] in March 2024 with clinical presentation improved after esophageal dilation; no s/s of aspiration. PMH includes: esophageal stricture s/p dilation procedures, prior saddle embolus, afib, HFpEF, DMII, CAD, chronic low back pain s/p spinal cord stimulator Clinical Impression: Clinical Impression: Pt has a pharyngeal more than oral dysphagia that includes silent aspiration of liquids. She has impaired timing, laryngeal vestibule closure, sensation, and suspect reduced glottic closure. Small amounts of silent aspiration occur before the swallow with all liquid consistencies. Aspiration after the swallow is suspected with honey thick liquids, which leave mild pharyngeal  residuals. Although pt does not cough spontaneously, when asked if she felt like she needed to, she did say yes. Her cued cough did not clear any of the material that had already been aspirated though. She achieves the most consistent airway protection with single cup sips of nectar thick liquids, although the study was overall somewhat limited by pt motion as well as what she was willing to consume. She refused anything more solid than a puree, and although she did not have any regurgitation with purees, she subjectively felt like she has had no improvements in swallowing. Recommend full liquid diet thickened to nectar with asistance during PO intake to facilitate use of small, single cup sips (no straws). Factors that may increase risk of adverse event in  presence of aspiration Rubye Oaks & Clearance Coots 2021): Factors that may increase risk of adverse event in presence of aspiration Rubye Oaks & Clearance Coots 2021): Respiratory or GI disease; Reduced cognitive function; Frail or deconditioned; Limited mobility; Weak cough; Aspiration of thick, dense, and/or acidic materials Recommendations/Plan: Swallowing Evaluation Recommendations Swallowing Evaluation Recommendations Recommendations: PO diet PO Diet Recommendation: Full liquid diet; Mildly thick liquids (Level 2, nectar thick) Liquid Administration via: Cup; No straw Medication Administration: Crushed with puree Supervision: Staff to assist with self-feeding; Full supervision/cueing for swallowing strategies Swallowing strategies  : Slow rate; Small bites/sips Postural changes: Position pt fully upright for meals; Stay upright 30-60 min after meals Oral care recommendations: Oral care BID (2x/day) Caregiver Recommendations: Avoid jello, ice cream, thin soups, popsicles; Remove water pitcher Treatment Plan Treatment Plan Treatment recommendations: Therapy as outlined in treatment plan below Follow-up recommendations: Skilled nursing-short term rehab (<3 hours/day) Functional  status assessment: Patient has had a recent decline in their functional status and demonstrates the ability to make significant improvements in function in a reasonable and predictable amount of time. Treatment frequency: Min 2x/week Treatment duration: 2 weeks Interventions: Aspiration precaution training; Oropharyngeal exercises; Compensatory techniques; Patient/family education; Trials of upgraded texture/liquids; Diet toleration management by SLP; Respiratory muscle strength training Recommendations Recommendations for follow up therapy are one component of a multi-disciplinary discharge planning process, led by the attending physician.  Recommendations may be updated based on patient status, additional functional criteria and insurance authorization. Assessment: Orofacial Exam: Orofacial Exam Oral Cavity - Dentition: Edentulous; Dentures, not available Anatomy: No data recorded Boluses Administered: Boluses Administered Boluses Administered: Thin liquids (Level 0); Mildly thick liquids (Level 2, nectar thick); Moderately thick liquids (Level 3, honey thick); Puree (pt refused solids)  Oral Impairment Domain: Oral Impairment Domain Lip Closure: No labial escape Tongue control during bolus hold: Cohesive bolus between tongue to palatal seal (only tested with thin liquids as oral holding seemed to interfere with coordination of swallow) Bolus preparation/mastication: -- (refused solids) Bolus transport/lingual motion: Brisk tongue motion Oral residue: Complete oral clearance Location of oral residue : N/A Initiation of pharyngeal swallow : Posterior laryngeal surface of the epiglottis  Pharyngeal Impairment Domain: Pharyngeal Impairment Domain Soft palate elevation: No bolus between soft palate (SP)/pharyngeal wall (PW) Laryngeal elevation: Complete superior movement of thyroid cartilage with complete approximation of arytenoids to epiglottic petiole Anterior hyoid excursion: Complete anterior movement Epiglottic  movement: Complete inversion Laryngeal vestibule closure: Incomplete, narrow column air/contrast in laryngeal vestibule Pharyngeal stripping wave : Present - diminished Pharyngeal contraction (A/P view only): N/A Pharyngoesophageal segment opening: Partial distention/partial duration, partial obstruction of flow Tongue base retraction: Trace column of contrast or air between tongue base and PPW Pharyngeal residue: Collection of residue within or on pharyngeal structures Location of pharyngeal residue: Valleculae; Pyriform sinuses; Pharyngeal wall  Esophageal Impairment Domain: Esophageal Impairment Domain Esophageal clearance upright position: Esophageal retention Pill: No data recorded Penetration/Aspiration Scale Score: Penetration/Aspiration Scale Score 1.  Material does not enter airway: Puree 8.  Material enters airway, passes BELOW cords without attempt by patient to eject out (silent aspiration) : Thin liquids (Level 0); Mildly thick liquids (Level 2, nectar thick); Moderately thick liquids (Level 3, honey thick) Compensatory Strategies: Compensatory Strategies Compensatory strategies: Yes Straw: Ineffective Ineffective Straw: Thin liquid (Level 0); Mildly thick liquid (Level 2, nectar thick); Moderately thick liquid (Level 3, honey thick)   General Information: Caregiver present: No  Diet Prior to this Study: Thin liquids (Level 0); Clear liquid diet   Temperature : Normal  Respiratory Status: WFL   Supplemental O2: None (Room air)   History of Recent Intubation: No  Behavior/Cognition: Alert; Cooperative; Pleasant mood; Requires cueing Self-Feeding Abilities: Needs assist with self-feeding Baseline vocal quality/speech: Normal Volitional Cough: Able to elicit Volitional Swallow: Able to elicit Exam Limitations: Poor participation; Poor bolus acceptance; Excessive movement Goal Planning: Prognosis for improved oropharyngeal function: Good Barriers to Reach Goals: Cognitive deficits No data recorded  Patient/Family Stated Goal: to get help for her swallowing Consulted and agree with results and recommendations: Patient Pain: Pain Assessment Pain Assessment: Faces Faces Pain Scale: 4 Pain Location: back Pain Descriptors / Indicators: Aching Pain Intervention(s): Limited activity within patient's tolerance; Monitored during session; Repositioned End of Session: Start Time:SLP Start Time (ACUTE ONLY): 1327 Stop Time: SLP Stop Time (ACUTE ONLY): 1338 Time Calculation:SLP Time Calculation (min) (ACUTE ONLY): 11 min Charges: SLP Evaluations $ SLP Speech Visit: 1 Visit SLP Evaluations $MBS Swallow: 1 Procedure $Swallowing Treatment: 1 Procedure SLP visit diagnosis: SLP Visit Diagnosis: Dysphagia, pharyngeal phase (R13.13) Past Medical History: Past Medical History: Diagnosis Date  CAD (coronary artery disease) 11/04/2016  Cancer (HCC)   skin cancer  Cauda equina compression (HCC) 08/28/2021  Cerebrovascular disease   Chronic atrial fibrillation (HCC) 09/28/2018  Chronic bilateral low back pain without sciatica 03/08/2018  Chronic kidney disease   Chronic obstructive lung disease (HCC) 09/28/2018  Chronic pain syndrome 03/08/2018  Chronic renal failure 09/28/2018  Chronic, continuous use of opioids 03/04/2021  Last Assessment & Plan:  Formatting of this note might be different from the original. Patient and I have discussed the hazardous effects of continued opiate pain medication usage. Risks and benefits of above medications including but not limited to possibility of respiratory depression, sedation, and even death were discussed with the patient who expressed an understanding.  Patient did not displ  CKD (chronic kidney disease) stage 3, GFR 30-59 ml/min (HCC)   Class 1 obesity due to excess calories with serious comorbidity and body mass index (BMI) of 34.0 to 34.9 in adult 05/20/2021  Last Assessment & Plan: Formatting of this note might be different from the original. Patient educated about the detrimental  effects of weight as it specifically pertains to pain management and overall health.  Patient's BMI 34.08 Encouraged healthy eating habits and routine low-impact cardiovascular exercises as tolerated.  Comprehensive diabetic foot examination, type 2 DM, encounter for (HCC) 04/29/2021  DDD (degenerative disc disease), lumbar 11/17/2018  Last Assessment & Plan:  Formatting of this note might be different from the original. See spinal stenosis plan  Diabetes (HCC)   Diabetes mellitus due to underlying condition with unspecified complications (HCC) 11/04/2016  Dyshidrotic eczema 04/29/2021  Dysrhythmia   afib  Essential hypertension 11/04/2016  Facet arthritis of lumbar region 03/08/2018  Facet joint disease 11/17/2018  Last Assessment & Plan:  Formatting of this note might be different from the original. See spinal stenosis plan  Foraminal stenosis of lumbar region 03/08/2018  Heart murmur   History of knee replacement, total, bilateral 11/17/2018  Last Assessment & Plan:  Formatting of this note might be different from the original. Have recommended to her several times if any further concerns in regards to the pain status post arthroplasty, she is to follow up with her surgeon.  History of pulmonary embolism 11/28/2021  Hypertension   Hypertensive disorder 09/28/2018  Increased body mass index 09/28/2018  Lumbar radiculopathy 03/08/2018  Lumbar spondylosis 03/20/2019  Mixed dyslipidemia 11/04/2016  Morbid obesity (HCC) 11/04/2016  Muscle pain 04/29/2020  Osteoarthritis of  left knee   PAF (paroxysmal atrial fibrillation) (HCC) 04/30/2015  Pain management contract agreement 04/13/2019  Last Assessment & Plan:  Formatting of this note might be different from the original. Contract updated today.  UDS completed.  [place] controlled substance registry reviewed and is consistent with her regimen.  Peripheral vascular disease (HCC) 09/28/2018  Preop cardiovascular exam 05/01/2019  Pressure injury of skin 11/21/2021   Pulmonary emboli [facility])   Pulmonary embolism (HCC) 11/21/2021  Renal insufficiency 04/30/2015  S/P insertion of spinal cord stimulator 07/13/2019  Last Assessment & Plan:  Formatting of this note might be different from the original. About 5 months status post insertion of spinal cord stimulator with excellent relief.  She continues to keep close follow-up with her representative and has been working to appropriately find best programs to get her adequate pain relief.  Sleep apnea   Smoker 09/28/2018  Spinal stenosis of lumbar region 03/20/2019  Spinal stenosis of lumbar region with neurogenic claudication 03/08/2018  Last Assessment & Plan:  Formatting of this note is different from the original. 77 year old female with chronic low back pain and bilateral, right greater than left, L4-5 radicular pain, and claudication. She has substantial amount of multifactorial degenerative thoracic and lumbar spine pathology.  After being deemed an inappropriate open surgical candidate, and failing to respond to multiple in  Spondylolisthesis 03/20/2019  Spondylosis of lumbosacral region without myelopathy or radiculopathy 01/30/2019  Last Assessment & Plan:  Formatting of this note might be different from the original. See spinal stenosis plan  Tinea unguium 02/03/2022  Type 2 diabetes mellitus (HCC) 09/28/2018 Past Surgical History: Past Surgical History: Procedure Laterality Date  ABDOMINAL HYSTERECTOMY    BACK SURGERY    BIOPSY  06/18/2022  Procedure: BIOPSY;  Surgeon: Lemar Lofty., MD;  Location: [facility] ENDOSCOPY;  Service: Gastroenterology;;  ESOPHAGOGASTRODUODENOSCOPY (EGD) WITH PROPOFOL N/A 06/18/2022  Procedure: ESOPHAGOGASTRODUODENOSCOPY (EGD) WITH PROPOFOL;  Surgeon: Lemar Lofty., MD;  Location: [name] ENDOSCOPY;  Service: Gastroenterology;  Laterality: N/A;  EYE SURGERY    bilateral cataracts  IR ANGIOGRAM PULMONARY BILATERAL SELECTIVE  11/21/2021  IR ANGIOGRAM SELECTIVE EACH ADDITIONAL VESSEL  11/21/2021   IR ANGIOGRAM SELECTIVE EACH ADDITIONAL VESSEL  11/21/2021  IR THROMBECT PRIM MECH INIT (INCLU) MOD SED  11/21/2021  IR US GUIDE VASC ACCESS RIGHT  11/21/2021  JOINT REPLACEMENT Bilateral   knees  RADIOLOGY WITH ANESTHESIA N/A 11/21/2021  Procedure: IR WITH ANESTHESIA;  Surgeon: Radiologist, Medication, MD;  Location: [facility] OR;  Service: Radiology;  Laterality: N/A;  SAVORY DILATION N/A 06/18/2022  Procedure: SAVORY DILATION;  Surgeon: Meridee Score Netty Starring., MD;  Location: [facility] ENDOSCOPY;  Service: Gastroenterology;  Laterality: N/A;  SPINAL CORD STIMULATOR INSERTION N/A 07/07/2019  Procedure: LUMBAR SPINAL CORD STIMULATOR INSERTION;  Surgeon: Odette Fraction, MD;  Location: [facility] OR;  Service: Neurosurgery;  Laterality: N/A;  total left knee   Mahala Menghini., M.A. CCC-SLP Acute Rehabilitation Services Office [phone] Secure chat preferred 03/25/2023, 3:19 PM   ASSESSMENT/PLAN:  Assessment: Principal Problem:   Esophageal dysphagia Active Problems:   COPD (chronic obstructive pulmonary disease) (HCC)   Decubitus skin ulcer   Aspiration pneumonia (HCC)   Hyponatremia   Chronic CHF (congestive heart failure) (HCC)   Atrial fibrillation with RVR (HCC)   GI bleeding  Lisa Maxwell is a 77 y.o. person living with a history of esophageal strictures s/p dilation procedures, prior saddle embolus (11/2021), atrial fibrillation on Eliquis, HFpEF, type 2 diabetes, CAD, and chronic low back pain s/p spinal cord stimulation insertion  who presents with concerns for generalized weakness and poor oral intake and is admitted for failure to thrive and aspiration pneumonia on hospital day 4.  Plan:  Aspiration pneumonia 2/2 pharyngeal dysphagia History of esophageal strictures, non-obstructive Schatzki's ring s/p dilation Barium swallow completed yesterday, showed signs of pharyngeal dysphagia with silent aspiration of liquids. She was switched to nectar thick diet. We will hold off on maintenance fluids and do a PO trial and  see how she does. We will discuss with her husband the options available to [place] if she does not do well. She has received 5 days of antibiotics and is not demonstrating any apparent respiratory or infectious symptoms.  - Discontinued Unasyn given new delirium - Full liquid diet, nectar thick - Calorie count today - Initiate goals of care discussion with family  Delirium, likely hospital induced She remains confused, not oriented or able to participate in the exam today. - Stopping centrally-acting medications: oxycodone, unasyn, robaxin - Delirium precautions  Right kidney mass, suspected primary RCC CT abdomen/pelvis showed a 4.1 cm mass in right kidney, concerning for primary RCC. - Oncology saw the patient; recommended urology consult but stated patient unlikely to tolerate nephrectomy given current condition, which we agree with. Given her current condition today, we will hold off on further workup inpatient for now and consider outpatient follow up.   Atrial fibrillation, on anticoagulation HFpEF (EF 55-60% in 11/2022) Still in atrial fibrillation this morning. Blood pressure stable. No acute concerns.  - Continue eliquis 5 mg BID - Holding spironolactone, lasix given hypotension   Hyponatremia, resolved Dehydration in the setting of poor solute intake Hypotension, improved Sodium 133, up from 131 yesterday. Improved with IV fluids. BP has been stable. We will hold off on maintenance fluids and do a PO trial with calorie count today.  - daily BMP  Normocytic anemia History of posterior anal fissure and internal hemorrhoids Hemoglobin 8.4 today, down from 8.5 yesterday. Suspect hemodilution in the setting of anemia of chronic disease. Will continue to monitor.  - daily CBC   Chronic conditions: COPD: on room air at home; uses breo ellipta and albuterol inhalers at home, restarted. Chronic back pain s/p spinal cord stimulator: taking oxycodone 10 mg QID as needed, robaxin 500 mg  TID, reordered. Takes gabapentin 600 mg at bedtime, held.  Type 2 diabetes mellitus: on Rybelsus 3 mg daily, held. A1c 6.1 nine months ago. CAD/HLD: on lovastatin 40 mg daily, reordered. Takes nitro 0.4 mg prn at home, held. GERD: takes protonix 40 mg daily at home, reordered.  Intertrigo: has fungal rash under breasts and inguinal areas, uses nystatin cream at home, reordered. Prior left occipital lobe infarct: old infarct seen on CT non-contrast  Best Practice: Diet: Full liquid, thick nectar IVF: Fluids: None VTE: Eliquis 5 mg BID Code: Full Therapy Recs: Pending DISPO: Anticipated discharge in 2-4 days to Skilled nursing facility pending medical treatment.  Signature: Annett Fabian, MD  Internal Medicine Resident, PGY-1 [facility]  Pager: [phone] 10:54 AM, 03/26/2023   Please contact the on call pager after 5 pm and on weekends at [phone].

## 2023-03-26 NOTE — Plan of Care (Signed)

## 2023-03-27 DIAGNOSIS — R1319 Other dysphagia: Secondary | ICD-10-CM | POA: Diagnosis not present

## 2023-03-27 LAB — BASIC METABOLIC PANEL
Anion gap: 7 (ref 5–15)
BUN: 13 mg/dL (ref 8–23)
CO2: 23 mmol/L (ref 22–32)
Calcium: 9.7 mg/dL (ref 8.9–10.3)
Chloride: 103 mmol/L (ref 98–111)
Creatinine, Ser: 0.86 mg/dL (ref 0.44–1.00)
GFR, Estimated: >60 mL/min (ref >60)
Glucose, Bld: 172 mg/dL — ABNORMAL HIGH (ref 70–99)
Potassium: 3.7 mmol/L (ref 3.5–5.1)
Sodium: 133 mmol/L — ABNORMAL LOW (ref 135–145)

## 2023-03-27 LAB — CBC
HCT: 26.3 % — ABNORMAL LOW (ref 36.0–46.0)
Hemoglobin: 8.5 g/dL — ABNORMAL LOW (ref 12.0–15.0)
MCH: 28.7 pg (ref 26.0–34.0)
MCHC: 32.3 g/dL (ref 30.0–36.0)
MCV: 88.9 fL (ref 80.0–100.0)
Platelets: 308 10*3/uL (ref 150–400)
RBC: 2.96 MIL/uL — ABNORMAL LOW (ref 3.87–5.11)
RDW: 15.5 % (ref 11.5–15.5)
WBC: 6.7 10*3/uL (ref 4.0–10.5)
nRBC: 0 % (ref 0.0–0.2)

## 2023-03-27 LAB — CULTURE, BLOOD (ROUTINE X 2)
Culture: NO GROWTH
Culture: NO GROWTH

## 2023-03-27 MED ORDER — PANTOPRAZOLE SODIUM 40 MG PO TBEC
40.0000 mg | DELAYED_RELEASE_TABLET | Freq: Every day | ORAL | Status: DC
  Administered 2023-03-28 – 2023-04-02 (×6): 40 mg via ORAL
  Filled 2023-03-27 (×6): qty 1

## 2023-03-27 MED ORDER — APIXABAN 5 MG PO TABS
5.0000 mg | ORAL_TABLET | Freq: Two times a day (BID) | ORAL | Status: DC
  Administered 2023-03-27 – 2023-04-02 (×12): 5 mg via ORAL
  Filled 2023-03-27 (×12): qty 1

## 2023-03-27 MED ORDER — PRAVASTATIN SODIUM 40 MG PO TABS
40.0000 mg | ORAL_TABLET | Freq: Every day | ORAL | Status: DC
  Administered 2023-03-27 – 2023-04-01 (×6): 40 mg via ORAL
  Filled 2023-03-27 (×6): qty 1

## 2023-03-27 MED ORDER — NYSTATIN 100000 UNIT/GM EX CREA
1.0000 | TOPICAL_CREAM | Freq: Two times a day (BID) | CUTANEOUS | Status: DC
  Administered 2023-03-27 – 2023-04-02 (×11): 1 via TOPICAL
  Filled 2023-03-27: qty 30

## 2023-03-27 NOTE — Progress Notes (Addendum)
HD#5 SUBJECTIVE:  Patient Summary: Lisa Maxwell is a 77 y.o. female with a pertinent PMH of esophageal strictures, Schatzki's ring s/p dilation, prior saddle embolus (11/2021), atrial fibrillation on Eliquis, HFpEF, type 2 diabetes, CAD, and chronic low back pain s/p spinal cord stimulation insertion who presents with concerns for generalized weakness and poor oral intake and is admitted for concerns of aspiration pneumonia and hypovolemic hyponatremia secondary to esophageal dysphagia.   Overnight Events: None  Interim History: Husband, daughter, and friend are bedside this morning. She reports she is comfortable with no concerns. She is oriented to self and knows the names of her family members. Per family, her appetite is low and she has not been wanting to eat much since yesterday. She denies any new difficulty breathing or coughing.   OBJECTIVE:  Vital Signs: Vitals:   03/27/23 0125 03/27/23 0447 03/27/23 0844 03/27/23 1142  BP: 121/76 123/73 107/65 113/86  Pulse: 91 66 87 92  Resp: 18 16 16 17   Temp: 99 F (37.2 C) 98.2 F (36.8 C) 98.2 F (36.8 C) 98.2 F (36.8 C)  TempSrc: Oral Axillary Axillary Axillary  SpO2: 99% 98% 98% 96%  Weight:  51.5 kg    Height:       Supplemental O2: Room Air SpO2: 96 %  Filed Weights   03/25/23 0428 03/26/23 0545 03/27/23 0447  Weight: 52.4 kg 52.6 kg 51.5 kg    Intake/Output Summary (Last 24 hours) at 03/27/2023 1401 Last data filed at 03/27/2023 0448 Gross per 24 hour  Intake 1530 ml  Output 726 ml  Net 804 ml   Net IO Since Admission: -2,419.7 mL [03/27/23 1401]  Physical Exam: General: laying in bed, in no acute distress Cardiac: still in Afib, normal rate; blood pressure stable Pulmonary: normal rate and effort; no coughing appreciated on exam Neuro: Oriented to self and family, but not place or situation; still inattentive and confused  Patient Lines/Drains/Airways Status     Active Line/Drains/Airways     Name  Placement date Placement time Site Days   Peripheral IV 03/22/23 20 G Right Antecubital 03/22/23  1522  Antecubital  1   Peripheral IV 03/22/23 20 G Anterior;Left;Proximal Forearm 03/22/23  1755  Forearm  1   Urethral Catheter Gerardo A, RN Double-lumen 14 Fr. 12/01/22  1320  Double-lumen  112   Pressure Injury 11/21/21 Buttocks Left;Medial Stage 2 -  Partial thickness loss of dermis presenting as a shallow open injury with a red, pink wound bed without slough. 11/21/21  1112  -- 487           Pertinent Labs:    Latest Ref Rng & Units 03/27/2023    2:29 AM 03/26/2023    2:59 AM 03/25/2023    2:41 AM  CBC  WBC 4.0 - 10.5 K/uL 6.7  6.2  7.1   Hemoglobin 12.0 - 15.0 g/dL 8.5  8.4  8.5   Hematocrit 36.0 - 46.0 % 26.3  25.6  26.2   Platelets 150 - 400 K/uL 308  312  311       Latest Ref Rng & Units 03/27/2023    2:29 AM 03/26/2023    2:59 AM 03/25/2023    2:41 AM  CMP  Glucose 70 - 99 mg/dL 295  621  78   BUN 8 - 23 mg/dL 13  11  14    Creatinine 0.44 - 1.00 mg/dL 3.08  6.57  8.46   Sodium 135 - 145 mmol/L 133  133  131   Potassium 3.5 - 5.1 mmol/L 3.7  3.5  3.6   Chloride 98 - 111 mmol/L 103  103  99   CO2 22 - 32 mmol/L 23  23  23    Calcium 8.9 - 10.3 mg/dL 9.7  9.3  9.5    No results for input(s): "GLUCAP" in the last 72 hours.   ASSESSMENT/PLAN:  Assessment: Principal Problem:   Esophageal dysphagia Active Problems:   COPD (chronic obstructive pulmonary disease) (HCC)   Decubitus skin ulcer   Aspiration pneumonia (HCC)   Hyponatremia   Chronic CHF (congestive heart failure) (HCC)   Atrial fibrillation with RVR (HCC)   GI bleeding   Malnutrition of moderate degree  Lisa Maxwell is a 77 y.o. person living with a history of esophageal strictures s/p dilation procedures, prior saddle embolus (11/2021), atrial fibrillation on Eliquis, HFpEF, type 2 diabetes, CAD, and chronic low back pain s/p spinal cord stimulation insertion who presents with concerns for generalized  weakness and poor oral intake and is admitted for failure to thrive and aspiration pneumonia on hospital day 5.  Plan:  Aspiration pneumonia 2/2 pharyngeal dysphagia History of esophageal strictures, non-obstructive Schatzki's ring s/p dilation She has been afebrile since yesterday. Per family and patient, there has been no difficulty breathing or coughing to suggest ongoing pneumonia. She looks comfortable on exam. Per nursing, a calorie count was ordered but not collected yesterday. Husband was bedside most of the day and states she had 33 grams of protein over the course of the day. He notes that she has barely eaten today and does not seem to have an appetite. We spoke with her family about our concerns about her decreased appetite in the setting of her overall weakness, and shared our recommendations to not pursue a feeding tube if she is unable to adequately feed herself. They are planning to discuss this with the patient to determine her wishes about continuing to pursue medical interventions vs hospice care. For now, we will continue to encourage oral intake and monitor a calorie count today.  - Full liquid diet, nectar thick; encourage eating with assistance - Spoke with nurse about collecting a calorie count today - Follow up on this morning's goals of care discussion with family  Delirium, likely hospital induced She continues to have delirium, which is waxing and waning. This morning, she is oriented to self and family/friends, but not situation or place.  - Holding centrally-acting medications: oxycodone, robaxin - Delirium precautions  Right kidney mass, suspected primary RCC She was seen by Oncology yesterday, who recommended further evaluation by urology. Given her current condition, we will defer further workup of this for now.    Atrial fibrillation, on anticoagulation HFpEF (EF 55-60% in 11/2022) She is still in atrial fibrillation today, rate is normal. Euvolemic on exam. No  acute concerns.  - Continue eliquis 5 mg BID - Holding spironolactone, lasix given hypotension   Hyponatremia, resolved Dehydration in the setting of poor solute intake Hypotension, improved - daily BMP  Normocytic anemia History of posterior anal fissure and internal hemorrhoids - daily CBC   Chronic conditions: COPD: on room air at home; uses breo ellipta and albuterol inhalers at home, restarted. Chronic back pain s/p spinal cord stimulator: taking oxycodone 10 mg QID as needed, robaxin 500 mg TID, reordered. Takes gabapentin 600 mg at bedtime, held.  Type 2 diabetes mellitus: on Rybelsus 3 mg daily, held. A1c 6.1 nine months ago. CAD/HLD: on lovastatin 40 mg daily, reordered.  Takes nitro 0.4 mg prn at home, held. GERD: takes protonix 40 mg daily at home, reordered.  Intertrigo: has fungal rash under breasts and inguinal areas, uses nystatin cream at home, reordered. Prior left occipital lobe infarct: old infarct seen on CT non-contrast  Best Practice: Diet: Full liquid, thick nectar IVF: Fluids: None VTE: Eliquis 5 mg BID Code: Full Therapy Recs: Pending DISPO: Anticipated discharge in 2-4 days to Skilled nursing facility vs hospice pending goals of care discussions and oral intake.  Signature: Annett Fabian, MD  Internal Medicine Resident, PGY-1 [facility]  Pager: [phone] 2:01 PM, 03/27/2023   Please contact the on call pager after 5 pm and on weekends at [phone].

## 2023-03-28 ENCOUNTER — Encounter (HOSPITAL_COMMUNITY): Payer: Self-pay | Admitting: Internal Medicine

## 2023-03-28 DIAGNOSIS — E440 Moderate protein-calorie malnutrition: Secondary | ICD-10-CM

## 2023-03-28 DIAGNOSIS — R410 Disorientation, unspecified: Secondary | ICD-10-CM

## 2023-03-28 HISTORY — DX: Disorientation, unspecified: R41.0

## 2023-03-28 MED ORDER — MIRTAZAPINE 15 MG PO TBDP
15.0000 mg | ORAL_TABLET | Freq: Every day | ORAL | Status: DC
  Administered 2023-03-28 – 2023-04-01 (×4): 15 mg via ORAL
  Filled 2023-03-28 (×6): qty 1

## 2023-03-28 NOTE — Progress Notes (Signed)
Mobility Specialist Progress Note:   03/28/23 1216  Mobility  Activity Moved into chair position in bed (bed mobility)  Level of Assistance Maximum assist, patient does 25-49% (+2)  Activity Response Tolerated well  Mobility visit 1 Mobility  Mobility Specialist Start Time (ACUTE ONLY) 0950  Mobility Specialist Stop Time (ACUTE ONLY) 1005  Mobility Specialist Time Calculation (min) (ACUTE ONLY) 15 min   Pt received in bed NT requesting assistance rolling patient to get her clean. Attempted to get pt to roll independently but pt seemed very confused needed max verbal cues. MaxA+2 for turning. Pt situated back in bed w/ call bell and personal belongings in reach. NT in room.   Thompson Grayer Mobility Specialist  Please contact vis Secure Chat or  Rehab Office [phone]

## 2023-03-28 NOTE — Progress Notes (Addendum)
                 Interval history No changes since yesterday.  Husband at bedside reports that she enjoyed more alertness yesterday and this morning is a little less alert.  She is not in pain or discomfort at present.  Physical exam Blood pressure 114/61, pulse 86, temperature (!) 97.5 F (36.4 C), temperature source Oral, resp. rate 18, height 4\' 8"  (1.422 m), weight 51.5 kg, SpO2 100%.  No distress Heart rate is normal, rhythm is irregular, strong radial pulse Breathing comfortably on room air Skin is warm and dry Alert but not oriented to place or time, inattentive  Assessment and plan Hospital day 6  Lisa Maxwell is a 77 y.o. with poor functional status admitted for further functional decline and malnutrition with hyponatremia.  Principal Problem:   Malnutrition of moderate degree Active Problems:   Atrial fibrillation (HCC)   COPD (chronic obstructive pulmonary disease) (HCC)   Decubitus skin ulcer   Esophageal dysphagia   Hyponatremia   Chronic CHF (congestive heart failure) (HCC)   Delirium  Malnutrition Workup for esophageal dysphagia showed nonobstructing Schatzki ring that was dilated.  She has some moderate pharyngeal dysphagia.  However she is also showing waning interest in food.  We're attempting to count calories but I do not think that she can maintain the caloric intake she needs to regain her strength.  Will start a trial of mirtazapine for appetite stimulation. - Mirtazapine disintegrating tablet 15 mg nightly  Decubitus skin ulcer Stage I, present on admission.  Wound care and manuka honey.  Delirium Waxing and waning mental status, very inattentive, disoriented.  Stopped telemetry monitoring.  Delirium precautions in place.  Have stopped nonessential sedating medications.  Dysphagia As per above.  She is on full liquid diet with nectar thick liquids.  Still remains high risk for aspiration despite this.  Atrial fibrillation Persistent.  Rate  controlled at present.  Continue apixaban.  Hyponatremia Much improved after IV fluid resuscitation.  Judicious monitoring with BMP.  Chronic CHF Stable.  Euvolemic.  COPD Chronic and stable.  ICS/LABA daily.  Goals of care Suspect her functional decline and malnutrition is not due to a reversible process.  Has been worked up for structural causes of malnutrition like esophageal dysphagia, treated for aspiration pneumonia, ruled out GI bleeding.  Despite this her condition is not improved and now she is somewhat delirious, but still comfortable.  I think her family will be open to discharge to SNF for around-the-clock care with hospice.  Resolved Problems:   Aspiration pneumonia (HCC)   Atrial fibrillation with RVR (HCC)   GI bleeding  Diet: Full liquid with nectar thick liquids IVF: N/A VTE: Therapeutically anticoagulated Code: Full PT/OT recommendations: SNF TOC recommendations: Insurance authorization pending. Family Update: Husband at bedside.  Discharge plan: Medically stable, now pending goals of care for disposition planning.  Marrianne Mood MD 03/28/2023, 3:46 PM  Pager: [phone] After 5pm or weekend: [phone]

## 2023-03-28 NOTE — TOC Progression Note (Signed)
Transition of Care [facility]) - Progression Note    Patient Details  Name: Lisa Maxwell MRN: [id number] Date of Birth: 1945/12/20  Transition of [facility]) CM/SW Contact  Beronica Lansdale A [place], [place] Phone Number: 03/28/2023, 8:52 AM  Clinical Narrative:     Pt's insurance auth status is still pending.     TOC will continue to follow.   Expected Discharge Plan: Skilled Nursing Facility Barriers to Discharge: Continued Medical Work up, SNF Pending bed offer  Expected Discharge Plan and Services In-house Referral: Clinical Social Work                                             Social Determinants of Health (SDOH) Interventions SDOH Screenings   Food Insecurity: No Food Insecurity (03/22/2023)  Housing: Low Risk  (03/22/2023)  Transportation Needs: No Transportation Needs (03/22/2023)  Utilities: Not At Risk (03/22/2023)  Alcohol Screen: Low Risk  (07/31/2022)  Depression (PHQ2-9): Low Risk  (07/31/2022)  Financial Resource Strain: Low Risk  (07/31/2022)  Physical Activity: Unknown (07/31/2022)  Tobacco Use: High Risk (03/24/2023)    Readmission Risk Interventions     No data to display

## 2023-03-28 NOTE — Plan of Care (Signed)
  Problem: Education: Goal: Knowledge of General Education information will improve Description: Including pain rating scale, medication(s)/side effects and non-pharmacologic comfort measures Outcome: Not Progressing   Problem: Activity: Goal: Risk for activity intolerance will decrease Outcome: Not Progressing   Problem: Nutrition: Goal: Adequate nutrition will be maintained Outcome: Not Progressing   

## 2023-03-28 NOTE — Plan of Care (Signed)

## 2023-03-29 DIAGNOSIS — D638 Anemia in other chronic diseases classified elsewhere: Secondary | ICD-10-CM

## 2023-03-29 DIAGNOSIS — R1319 Other dysphagia: Secondary | ICD-10-CM | POA: Diagnosis not present

## 2023-03-29 DIAGNOSIS — E440 Moderate protein-calorie malnutrition: Secondary | ICD-10-CM | POA: Diagnosis not present

## 2023-03-29 DIAGNOSIS — R531 Weakness: Secondary | ICD-10-CM

## 2023-03-29 DIAGNOSIS — E871 Hypo-osmolality and hyponatremia: Secondary | ICD-10-CM | POA: Diagnosis not present

## 2023-03-29 HISTORY — DX: Anemia in other chronic diseases classified elsewhere: D63.8

## 2023-03-29 MED ORDER — PRAVASTATIN SODIUM 40 MG PO TABS: 40.0000 mg | ORAL_TABLET | Freq: Every day | ORAL | Status: DC

## 2023-03-29 MED ORDER — MEDIHONEY WOUND/BURN DRESSING EX PSTE: 1.0000 | PASTE | Freq: Every day | CUTANEOUS | Status: DC

## 2023-03-29 MED ORDER — LIDOCAINE 5 % EX PTCH
1.0000 | MEDICATED_PATCH | CUTANEOUS | Status: DC
  Administered 2023-03-29 – 2023-04-02 (×5): 1 via TRANSDERMAL
  Filled 2023-03-29 (×5): qty 1

## 2023-03-29 MED ORDER — MIRTAZAPINE 15 MG PO TBDP: 15.0000 mg | ORAL_TABLET | Freq: Every day | ORAL | Status: DC

## 2023-03-29 MED ORDER — ACETAMINOPHEN 325 MG PO TABS
650.0000 mg | ORAL_TABLET | Freq: Four times a day (QID) | ORAL | Status: DC | PRN
  Administered 2023-03-29 – 2023-04-02 (×7): 650 mg via ORAL
  Filled 2023-03-29 (×7): qty 2

## 2023-03-29 NOTE — Care Management Important Message (Signed)
Important Message  Patient Details  Name: Lisa Maxwell MRN: [id number] Date of Birth: 09/03/1945   Important Message Given:  Yes - Medicare IM     Renie Ora 03/29/2023, 9:59 AM

## 2023-03-29 NOTE — Progress Notes (Signed)
Physical Therapy Treatment Patient Details Name: Lisa Maxwell MRN: [id number] DOB: December 26, 1945 Today's Date: 03/29/2023   History of Present Illness Pt is a 77 yo female presents 12/16 with generalized weakness and poor oral intake and is admitted for concerns of aspiration pneumonia and hypovolemic hyponatremia secondary to esophageal dysphagia.Found to have aspiration PNA, hyponatremia, in Afib, dehydration and GI bleed  PMH of esophageal strictures s/p dilation procedures, prior saddle embolus (11/2021), atrial fibrillation on Eliquis, HFpEF, type 2 diabetes, CAD, and chronic low back pain s/p spinal cord stimulation insertion AD, cerebrovascular disease, a-fib, COPD, chronic pain syndrome, renal failure, DDD, HTN, bil TKA, PVD, spinal stenosis lumbar spine with neurogenic claudication, cauda equina compression CAD CKD    PT Comments  Patient resting in bed with spouse present at bedside and eager to mobilize with therapy. Pt followed cues for reaching UE to bed rail and initiated moving LE's towards EOB; Max assist required with use of bed pad to pivot hips and raise trunk upright. Once seated EOB pt participated in seated exercises and returned to supine with max assist as back brace not present for extended time in sitting. Patient completed exercises in supine for LE strengthening with increased activation on Rt LE and AAROM requried for Lt LE. EOS pt repositioned for comfort. Patient will benefit from continued inpatient follow up therapy, <3 hours/day; will continue to progress pt as able during stay.     If plan is discharge home, recommend the following: A lot of help with walking and/or transfers;A lot of help with bathing/dressing/bathroom;Assistance with cooking/housework;Assistance with feeding;Direct supervision/assist for medications management;Direct supervision/assist for financial management;Assist for transportation;Help with stairs or ramp for entrance   Can travel by private  vehicle     No  Equipment Recommendations  None recommended by PT    Recommendations for Other Services       Precautions / Restrictions Precautions Precautions: Fall Restrictions Weight Bearing Restrictions Per Provider Order: No     Mobility  Bed Mobility Overal bed mobility: Needs Assistance Bed Mobility: Supine to Sit, Sit to Supine     Supine to sit: Max assist, HOB elevated, Used rails Sit to supine: Max assist, HOB elevated   General bed mobility comments: pt declined bed mobility this session    Transfers                   General transfer comment: back brace not available and transfer deferred    Ambulation/Gait                   Stairs             Wheelchair Mobility     Tilt Bed    Modified Rankin (Stroke Patients Only)       Balance Overall balance assessment: Needs assistance Sitting-balance support: Feet supported, No upper extremity supported, Bilateral upper extremity supported, Single extremity supported Sitting balance-Leahy Scale: Fair Sitting balance - Comments: flexed/kyphotic posture                                    Cognition Arousal: Alert Behavior During Therapy: WFL for tasks assessed/performed Overall Cognitive Status: Impaired/Different from baseline Area of Impairment: Orientation, Attention, Memory, Following commands, Safety/judgement, Awareness, Problem solving                 Orientation Level: Disoriented to, Place, Time, Situation Current Attention Level: Focused Memory: Decreased  recall of precautions, Decreased short-term memory Following Commands: Follows one step commands inconsistently, Follows one step commands with increased time (Follows 1-step commands with cues) Safety/Judgement: Decreased awareness of safety, Decreased awareness of deficits Awareness: Intellectual, Emergent Problem Solving: Slow processing, Decreased initiation, Difficulty sequencing, Requires  verbal cues, Requires tactile cues          Exercises General Exercises - Lower Extremity Ankle Circles/Pumps: AAROM, Both, 10 reps, Supine Long Arc Quad: AROM, Both, 10 reps, Seated Heel Slides: AROM, Right, AAROM, Left, 10 reps, Supine Hip ABduction/ADduction: AROM, Both, 10 reps, Supine Straight Leg Raises: AROM, Right, AAROM, Left, 10 reps, Supine    General Comments        Pertinent Vitals/Pain Pain Assessment Pain Assessment: Faces Faces Pain Scale: Hurts little more Pain Location: back with LE and transitional movement Pain Descriptors / Indicators: Discomfort, Grimacing Pain Intervention(s): Limited activity within patient's tolerance, Monitored during session, Repositioned    Home Living                          Prior Function            PT Goals (current goals can now be found in the care plan section) Acute Rehab PT Goals PT Goal Formulation: With patient/family Time For Goal Achievement: 04/06/23 Potential to Achieve Goals: Fair Progress towards PT goals: Progressing toward goals    Frequency    Min 1X/week      PT Plan      Co-evaluation              AM-PAC PT "6 Clicks" Mobility   Outcome Measure  Help needed turning from your back to your side while in a flat bed without using bedrails?: Total Help needed moving from lying on your back to sitting on the side of a flat bed without using bedrails?: Total Help needed moving to and from a bed to a chair (including a wheelchair)?: Total Help needed standing up from a chair using your arms (e.g., wheelchair or bedside chair)?: Total Help needed to walk in hospital room?: Total Help needed climbing 3-5 steps with a railing? : Total 6 Click Score: 6    End of Session Equipment Utilized During Treatment: Gait belt Activity Tolerance: Patient limited by pain Patient left: with call bell/phone within reach;with family/visitor present Nurse Communication: Mobility status PT Visit  Diagnosis: Muscle weakness (generalized) (M62.81);Difficulty in walking, not elsewhere classified (R26.2);Pain Pain - Right/Left:  (bil) Pain - part of body:  (bak)     Time: 4401-0272 PT Time Calculation (min) (ACUTE ONLY): 23 min  Charges:    $Therapeutic Exercise: 8-22 mins $Therapeutic Activity: 8-22 mins PT General Charges $$ ACUTE PT VISIT: 1 Visit                     Wynn Maudlin, DPT Acute Rehabilitation Services Office [phone]  03/29/23 1:33 PM

## 2023-03-29 NOTE — Progress Notes (Signed)
Speech Language Pathology Treatment: Dysphagia  Patient Details Name: Lisa Maxwell MRN: [id number] DOB: 14-Dec-1945 Today's Date: 03/29/2023 Time: 4696-2952 SLP Time Calculation (min) (ACUTE ONLY): 16 min  Assessment / Plan / Recommendation Clinical Impression  Pt looks cognitively a little improved since last SLP visit, verbalizing more and engaging in self-feeding when given set-up assistance. Family reports that she is having occasional coughing, but overall her intake has been very limited. Education was reinforced about strategies recommended from MBS, which I do think they are trying to follow, as well as limited results of MBS that leaves some question in terms of abilities. With nectar thick liquids and purees offered today, pt had delayed cough x1 when she started drinking nectar thick liquids more quickly. With Min cues from SLP, she did slow her rate, which was effective on MBS with nectar thick liquids to increase airway protection during the swallow. Family also says that pt has had no regurgitation on current diet. Recommend advancing to Dys 1 diet and nectar thick liquids, which would not be a big change in terms of consistencies provided, but would offer more options from a nutritional standpoint. Would still use the same precautions with emphasis on no straws, and single sips with nectar thick liquids.    HPI HPI: Pt is a 77 yo female presenting with generalized weakness and poor oral intake. Per MD H&P, husband reports 2-3 weeks of trouble swallowing, described as food feeling stuck in her throat (consistent with previous stricture), regurgitation with inability to keep food down, and coughing "sometimes" with thin liquids. Pt had swallow studies at [facility] the week PTA . Full report not available but per husband, she could swallow limited amounts on MBS fine, but after the applesauce she started to regurgitate. He said the SLP described it as a "clogged drain" through which  limited amounts were passing, but this was not seen on esopahgram later that week. Pt was previously seen by SLP at [facility] in March 2024 with clinical presentation improved after esophageal dilation; no s/s of aspiration. MRI 12/19 negative for acute findings. PMH includes: esophageal stricture s/p dilation procedures, prior saddle embolus, afib, HFpEF, DMII, CAD, chronic low back pain s/p spinal cord stimulator      SLP Plan  Continue with current plan of care      Recommendations for follow up therapy are one component of a multi-disciplinary discharge planning process, led by the attending physician.  Recommendations may be updated based on patient status, additional functional criteria and insurance authorization.    Recommendations  Diet recommendations: Dysphagia 1 (puree);Nectar-thick liquid Liquids provided via: Teaspoon;Cup;No straw Medication Administration: Crushed with puree Supervision: Staff to assist with self feeding;Full supervision/cueing for compensatory strategies Compensations: Slow rate;Small sips/bites;Other (Comment) (one sip at a time) Postural Changes and/or Swallow Maneuvers: Seated upright 90 degrees;Upright 30-60 min after meal                  Oral care BID   Frequent or constant Supervision/Assistance Dysphagia, pharyngeal phase (R13.13)     Continue with current plan of care     Mahala Menghini., M.A. CCC-SLP Acute Rehabilitation Services Office [phone]  Secure chat preferred   03/29/2023, 3:17 PM

## 2023-03-29 NOTE — Progress Notes (Signed)
Brief Palliative Medicine Progress Note:  PMT consult received and chart reviewed.   Noted Discharge Summary has been started and [facility] has already sent outpatient Palliative Care referral to [facility].  Discussed case with Dr. Geraldo Pitter - possible discharge today to SNF pending new PT note and peer-to-peer approval.  IMTS has reviewed a MOST form with family who wish to continue discussions between family prior to finalizing.   No acute PMT needs at this time. If patient does not discharge today, IMTS will reach out to PMT if any further needs arise. Per Dr. Geraldo Pitter, family would benefit from further education regarding topics on MOST form: code status and artificial hydration/nutrition.  Thank you for allowing PMT to assist in the care of this patient.  Gleen Ripberger M. Katrinka Blazing [facility] Palliative Medicine Team Team Phone: [phone] NO CHARGE

## 2023-03-29 NOTE — Progress Notes (Signed)
Nutrition Follow-up Note  Calorie count was completed over the weekend.   Diet: Full liquid, nectar thick Supplements: magic cup TID with meals, mighty shake TID with meals.  Breakfast 12/21: patient refused Lunch 12/21: 290 kcal, 9 gm protein Dinner: Meal ticket not saved  Breakfast 12/22: 150 kcal, 12 gm protein Lunch 12/22: 580 kcal, 18 gm protein Dinner: Meal ticket not saved  Most of patient's intake is coming from the supplements that were added to her trays. She is not meeting nutrition needs with current oral intake. Diet has been advanced to dysphagia 1 with nectar thick liquids by SLP. Hopefully intake will be a little better with this upgraded diet.  Palliative Care / Hospice has been contacted with plans to follow patient after d/c to SNF. Plans for d/c as early as today or tomorrow.   Labs and medications reviewed.   Nutrition Dx: Moderate Malnutrition related to chronic illness (dysphagia) as evidenced by mild muscle depletion, percent weight loss (23% weight loss within one year).  ~Ongoing  Goal: Patient will meet greater than or equal to 90% of their needs. ~Unmet  Intervention:  Discontinue calorie count. Continue supplements: Magic cup TID with meals, each supplement provides 290 kcal and 9 grams of protein Mighty Shake TID with meals, each supplement provides 330 kcals and 9 grams of protein   Gabriel Rainwater RD, LDN, CNSC Please refer to Amion for contact information.

## 2023-03-29 NOTE — Discharge Instructions (Signed)
Ms Yuleidi, Seher were hospitalized for aspiration pneumonia. You have completed your course of antibiotics and are doing much better. Thank you for allowing [place] to be part of your care.   Please schedule a follow up appointment with your primary care doctor.   Please note these changes made to your medications:   *Please STOP taking:  Bethanechol Furosemide Gabapentin Potassium chloride Lovastatin Robaxin Naloxone Oxycodone Rybelsus Spironolactone Tamsulosin  Please make sure to return to the hospital if you have worsening shortness of breath, confusion, or a fever.   Thanks,  Dr Carlynn Purl

## 2023-03-29 NOTE — TOC Progression Note (Addendum)
Transition of Care [facility]) - Progression Note    Patient Details  Name: Lisa Maxwell MRN: [id number] Date of Birth: July 03, 1945  [facility] CM/SW Contact  Michaela Corner, [place] Phone Number: 03/29/2023, 12:08 PM  Clinical Narrative:  Per MD, family is considering outpt palliative for when pt DC to [facility]. [facility] offers palliative services through [facility].   11:58AM: Fransico Him contacted CSW to offer peer to peer, to be completed by 4PM today [phone] option [phone]).  CSW notified MD.   12:08PM:  CSW spoke with pts spouse, Lisa Maxwell, about referral to [facility], Mr. Belmar agreed and gave CSW permission to do referral. CSW explained to Mr. Thornes that insurance is offering a peer to peer to determine approval of SNF. Mr.Woolbright states he is not able to care for his wife by himself if she will need to be discharged home.   12:14PM: CSW spoke with Cheri with [facility] and she accepted referral at this time.   1:04PM: Per MD, peer to peer resulted in ins case reviewer giving MD until 5PM today to review updated PT note and report back.   2:48PM: Per Fransico Him, peer to peer was completed and they will need updated clinical documentation.  3:41PM: CSW spoke with [employer] representative to confirm additional documentation was received. Per rep, they received documentation and will send it over for medical review - an auth determination may come back by end of day or tomorrow 12/24.  Expected Discharge Plan: Skilled Nursing Facility Barriers to Discharge: Continued Medical Work up, SNF Pending bed offer  Expected Discharge Plan and Services In-house Referral: Clinical Social Work                                             Social Determinants of Health (SDOH) Interventions SDOH Screenings   Food Insecurity: No Food Insecurity (03/22/2023)  Housing: Low Risk  (03/22/2023)  Transportation Needs: No  Transportation Needs (03/22/2023)  Utilities: Not At Risk (03/22/2023)  Alcohol Screen: Low Risk  (07/31/2022)  Depression (PHQ2-9): Low Risk  (07/31/2022)  Financial Resource Strain: Low Risk  (07/31/2022)  Physical Activity: Unknown (07/31/2022)  Tobacco Use: High Risk (03/24/2023)    Readmission Risk Interventions     No data to display

## 2023-03-29 NOTE — Progress Notes (Addendum)
Subjective:   Summary: Patient is a 77 year old woman with poor functional status, admitted for functional decline and hyponatremia related to poor oral intake/malnutrition.   Hospital Day 7  Patient continues to be confused and not fully participating in conversation. Husband at bedside helps provide information. She continues to not have much appetite. He has been bringing in soft food from home that she enjoys. Completed a MOST form today, husband is well aware of patient's poor prognosis.   Objective:  Vital signs in last 24 hours: Vitals:   03/28/23 2340 03/29/23 0400 03/29/23 0501 03/29/23 0741  BP: 114/71  129/73 121/69  Pulse:   (!) 42 87  Resp: 18  17 17   Temp: 97.7 F (36.5 C)  98.3 F (36.8 C) (!) 97.5 F (36.4 C)  TempSrc: Oral  Axillary Oral  SpO2: 96%  97% 98%  Weight:  50.9 kg    Height:       Supplemental O2: Room Air SpO2: 98 %  Physical Exam:  Constitutional: chronically ill appearing, poorly oriented, slow to speak Cardiovascular: RRR Pulmonary/Chest: normal work of breathing on room air, lungs clear to auscultation bilaterally Abdominal: soft, non-tender, non-distended  Pertinent Labs:    Latest Ref Rng & Units 03/27/2023    2:29 AM 03/26/2023    2:59 AM 03/25/2023    2:41 AM  CBC  WBC 4.0 - 10.5 K/uL 6.7  6.2  7.1   Hemoglobin 12.0 - 15.0 g/dL 8.5  8.4  8.5   Hematocrit 36.0 - 46.0 % 26.3  25.6  26.2   Platelets 150 - 400 K/uL 308  312  311        Latest Ref Rng & Units 03/27/2023    2:29 AM 03/26/2023    2:59 AM 03/25/2023    2:41 AM  CMP  Glucose 70 - 99 mg/dL 347  425  78   BUN 8 - 23 mg/dL 13  11  14    Creatinine 0.44 - 1.00 mg/dL 9.56  3.87  5.64   Sodium 135 - 145 mmol/L 133  133  131   Potassium 3.5 - 5.1 mmol/L 3.7  3.5  3.6   Chloride 98 - 111 mmol/L 103  103  99   CO2 22 - 32 mmol/L 23  23  23    Calcium 8.9 - 10.3 mg/dL 9.7  9.3  9.5    Assessment/Plan:   #Malnutrition #Hyponatremia Workup for  esophageal dysphagia showed nonobstructing Schatzki ring that was dilated. She has some moderate pharyngeal dysphagia. Hyponatremia is much improved after IV fluid resuscitation. Mirtazapine started for appetite stimulation. Continue to monitor electrolytes with daily labs.   #Goals of care Completed MOST form with patient and husband. They do want outpatient palliative services. Family wishes patient to go to [facility] where she has stayed multiple times  #Decubitus skin ulcer Stage 1, present on admission. Wound care and manuka honey  #Delirium Waxing and waning mental status. Delirium precautions in place, have stopped nonessential sedating medications  #Atrial fibrillation Persistent, rate controlled. Continue apixaban.   #Chronic CHF Stable, euvolemic  #Suspected renal cell carcinoma CT noted 4.1 cm lobulated enhancing mass within the medial interpolar region of the right kidney compatible with a primary renal cell carcinoma with no evidence of regional nodal or distant metastatic disease. Patient will likely not be a candidate for nephrectomy.    Diet: Full  liquid VTE: Eliquis Code: Full  Dispo: Anticipated discharge to Nursing Home pending insurance authorization  Monna Fam, MD PGY-1 Internal Medicine Resident Pager Number [phone] Please contact the on call pager after 5 pm and on weekends at [phone].

## 2023-03-29 NOTE — Discharge Summary (Incomplete)
Name: Lisa Maxwell MRN: [id number] DOB: 05-24-1945 77 y.o. PCP: Paulina Fusi, MD  Date of Admission: 03/22/2023  1:09 PM Date of Discharge: 03/29/23 Attending Physician: Dr. Cleda Daub  Discharge Diagnosis: Principal Problem:   Malnutrition of moderate degree Active Problems:   Atrial fibrillation (HCC)   COPD (chronic obstructive pulmonary disease) (HCC)   Decubitus skin ulcer   Esophageal dysphagia   Hyponatremia   Chronic CHF (congestive heart failure) (HCC)   Delirium    Discharge Medications: Allergies as of 03/29/2023   No Known Allergies      Medication List     STOP taking these medications    bethanechol 50 MG tablet Commonly known as: URECHOLINE   furosemide 40 MG tablet Commonly known as: LASIX   gabapentin 600 MG tablet Commonly known as: NEURONTIN   Klor-Con M20 20 MEQ tablet Generic drug: potassium chloride SA   lovastatin 40 MG tablet Commonly known as: MEVACOR Replaced by: pravastatin 40 MG tablet   methocarbamol 500 MG tablet Commonly known as: ROBAXIN   naloxone 4 MG/0.1ML Liqd nasal spray kit Commonly known as: NARCAN   Oxycodone HCl 10 MG Tabs   Rybelsus 3 MG Tabs Generic drug: Semaglutide   spironolactone 25 MG tablet Commonly known as: ALDACTONE   tamsulosin 0.4 MG Caps capsule Commonly known as: FLOMAX       TAKE these medications    albuterol 108 (90 Base) MCG/ACT inhaler Commonly known as: VENTOLIN HFA Inhale into the lungs every 6 (six) hours as needed for wheezing or shortness of breath.   Centrum Silver 50+Women Tabs Take 1 tablet by mouth daily with breakfast.   Co Q-10 200 MG Caps Take 200 mg by mouth in the morning.   docusate sodium 100 MG capsule Commonly known as: COLACE Take 100 mg by mouth 2 (two) times daily as needed for mild constipation or moderate constipation.   Eliquis 5 MG Tabs tablet Generic drug: apixaban TAKE 1 TABLET BY MOUTH TWICE A DAY   fluticasone furoate-vilanterol  100-25 MCG/ACT Aepb Commonly known as: BREO ELLIPTA Inhale 1-2 puffs into the lungs daily.   Iron (Ferrous Sulfate) 325 (65 Fe) MG Tabs Take 65 mg of iron by mouth daily with breakfast.   latanoprost 0.005 % ophthalmic solution Commonly known as: XALATAN Place 1 drop into both eyes at bedtime.   leptospermum manuka honey Pste paste Apply 1 Application topically daily. Start taking on: March 30, 2023   lidocaine 5 % Commonly known as: LIDODERM Place 1 patch onto the skin daily. Remove & Discard patch within 12 hours or as directed by MD   mirtazapine 15 MG disintegrating tablet Commonly known as: REMERON SOL-TAB Take 1 tablet (15 mg total) by mouth at bedtime.   nitroGLYCERIN 0.4 MG SL tablet Commonly known as: NITROSTAT Place 0.4 mg under the tongue every 5 (five) minutes as needed for chest pain.   nystatin cream Commonly known as: MYCOSTATIN Apply 1 Application topically 2 (two) times daily.   pantoprazole 40 MG tablet Commonly known as: PROTONIX Take 1 tablet (40 mg total) by mouth daily. What changed: when to take this   pravastatin 40 MG tablet Commonly known as: PRAVACHOL Take 1 tablet (40 mg total) by mouth daily at 6 PM. Replaces: lovastatin 40 MG tablet   QC TUMERIC COMPLEX PO Take 1 tablet by mouth at bedtime.   triamcinolone ointment 0.5 % Commonly known as: KENALOG Apply 1 Application topically at bedtime.   Vitamin B-12 500 MCG  Subl Place 500 mcg under the tongue in the morning.        Disposition and follow-up:   Lisa Maxwell was discharged from [facility] in Stable condition.  At the hospital follow up visit please address:  1.  Follow-up:  - RCC: new incidental finding of right kidney mass suspicious for RCC. Oncology was consulted and recommended urology follow up   -Diuretics were held during admission due to hypotension, consider restarting. Gabapentin, Robaxin, oxycodone were also held due to concern for  worsening delirium. Consider restarting as needed.   2.  Labs / imaging needed at time of follow-up: None  3.  Pending labs/ test needing follow-up: None  Follow-up Appointments:  Contact information for follow-up providers     Paulina Fusi, MD.   Specialty: Internal Medicine Contact information: [address] [place] [address] [phone]              Contact information for after-discharge care     Destination     [facility] .   Service: Skilled Nursing Contact information: [address] [place] [address] [phone]                    Hospital Course by problem list:    #Aspiration pneumonia, resolved #Hx esophageal stricture s/p dilation #Schatzki ring, s/p dilation Patient presented with generalized weakness and poor oral intake in the setting of known esophageal strictures. She had had worsening of chronic esophageal dysphagia for the past 3 weeks. Presented afebrile and with a leukocytosis. CXR at admission showed ovoid right upper lung opacity possibly representing acute pulmonary infiltrate, confirmed by CT chest showing multifocal ground-glass and consolidative infiltrates scattered throughout the lungs bilaterally. Patient's overall clinical picture was concerning for aspiration pneumonia. She was started on Zosyn, then transitioned to and completed a course of Unasyn. GI was consulted who found a non-obstructing Schatzki ring at the gastroesophageal junction; dilation was performed. Patient was able transition to full liquid diet without further concern for aspiration. Patient's chronic malnutrition and failure to thrive make her overall prognosis poor. Palliative care was consulted during admission who will hopefully continue to help with ongoing goals of care discussion after discharge.   #Hyponatremia Patient presented with a sodium of 125 in the setting of poor po  intake and chronic malnutrition. Sodium was trended with daily labs, which improved as her esophageal strictures were dilated and aspiration pneumonia was treated. Hyponatremia resolved with resumption of normal diet.    #Delirium Patient developed hospital delirium with waxing and waning mental status throughout admission. Delirium precautions were put in place and centrally acting medications were held.    #Atrial fibrillation Patient was in atrial fibrillation on presentation with tachycardia into the 110s-120s, possibly worsened in the setting of acute illness and dehydration. Home Eliquis 5mg  BID continued throughout admission.    #COPD Patient's home Breo Ellipta and albuterol were continued during admission.   #HFpEF (EF 55-60%) Home spironolactone was held, home Lasix continued. Patient was euvolemic and not needing accessory oxygen on day of discharge.    #Right kidney mass, suspected primary RCC CT Abdomen showed a lobulated enhancing mass seen within the right kidney measuring at least 2.1 x 2.8x 4.1 cm compatible with a primary renal cell carcinoma with no evidence of regional nodal or distant metastatic disease. Patient was seen by oncology who recommended further evaluation by urology. Further workup deferred to outpatient setting  given acute illness.   #T2DM Patient's home Rybelsus was held during admission.   #CAD Home statin was continued during admission  #GERD Home PPI continued    Discharge Subjective: Patient continues to be confused and not fully participating in conversation. Husband at bedside helps provide information. She continues to not have much appetite. He has been bringing in soft food from home that she enjoys. Completed a MOST form today, husband is well aware of patient's poor prognosis.   Discharge Exam:   BP (!) 121/42 (BP Location: Right Arm)   Pulse 87   Temp 97.8 F (36.6 C) (Oral)   Resp 20   Ht 4\' 8"  (1.422 m)   Wt 50.9 kg   SpO2 99%    BMI 25.16 kg/m  Constitutional: chronically ill appearing, poorly oriented, slow to speak HENT: normocephalic atraumatic, mucous membranes moist Eyes: conjunctiva non-erythematous Neck: supple Cardiovascular: regular rate and rhythm, no m/r/g Pulmonary/Chest: normal work of breathing on room air, lungs clear to auscultation bilaterally Abdominal: soft, non-tender, non-distended MSK: normal bulk and tone Neurological: alert, not oriented, slow to participate in conversation Skin: warm and dry  Pertinent Labs, Studies, and Procedures:     Latest Ref Rng & Units 03/27/2023    2:29 AM 03/26/2023    2:59 AM 03/25/2023    2:41 AM  CBC  WBC 4.0 - 10.5 K/uL 6.7  6.2  7.1   Hemoglobin 12.0 - 15.0 g/dL 8.5  8.4  8.5   Hematocrit 36.0 - 46.0 % 26.3  25.6  26.2   Platelets 150 - 400 K/uL 308  312  311        Latest Ref Rng & Units 03/27/2023    2:29 AM 03/26/2023    2:59 AM 03/25/2023    2:41 AM  CMP  Glucose 70 - 99 mg/dL 829  562  78   BUN 8 - 23 mg/dL 13  11  14    Creatinine 0.44 - 1.00 mg/dL 1.30  8.65  7.84   Sodium 135 - 145 mmol/L 133  133  131   Potassium 3.5 - 5.1 mmol/L 3.7  3.5  3.6   Chloride 98 - 111 mmol/L 103  103  99   CO2 22 - 32 mmol/L 23  23  23    Calcium 8.9 - 10.3 mg/dL 9.7  9.3  9.5     CT HEAD WO CONTRAST ( ) Result Date: 03/24/2023 CLINICAL DATA:  Dysphagia EXAM: CT HEAD WITHOUT CONTRAST TECHNIQUE: Contiguous axial images were obtained from the base of the skull through the vertex without intravenous contrast. RADIATION DOSE REDUCTION: This exam was performed according to the departmental dose-optimization program which includes automated exposure control, adjustment of the mA and/or kV according to patient size and/or use of iterative reconstruction technique. COMPARISON:  None Available. FINDINGS: Brain: No mass, hemorrhage or extra-axial collection. There is generalized volume loss. There is hypoattenuation of the bilateral supratentorial white matter. Old  left occipital infarct. Vascular: There is atherosclerotic calcification of both internal carotid arteries at the skull base. Skull: Normal. Negative for fracture or focal lesion. Sinuses/Orbits: No acute finding. Other: None. IMPRESSION: 1. No acute intracranial abnormality. 2. Old left occipital infarct and findings of chronic small vessel ischemia. Electronically Signed   By: Deatra Robinson M.D.   On: 03/24/2023 02:30   DG Chest Port 1 View Result Date: 03/22/2023 CLINICAL DATA:  Weakness EXAM: PORTABLE CHEST 1 VIEW COMPARISON:  12/05/2022 FINDINGS: Ascending thoracic stimulator leads with similar positioning. Cardiac enlargement  with mild central congestion. No pleural effusion. No pneumothorax. Ovoid right upper lung opacity. IMPRESSION: 1. Cardiac enlargement with mild central congestion. 2. Ovoid right upper lung opacity, this could represent acute pulmonary infiltrate with lung nodule not excluded. Short interval radiographic follow-up is recommended. Electronically Signed   By: Jasmine Pang M.D.   On: 03/22/2023 16:26   Signed: Monna Fam, MD PGY-1 03/29/2023, 1:21 PM   Pager: [phone]

## 2023-03-30 DIAGNOSIS — J449 Chronic obstructive pulmonary disease, unspecified: Secondary | ICD-10-CM | POA: Diagnosis not present

## 2023-03-30 DIAGNOSIS — R1319 Other dysphagia: Secondary | ICD-10-CM | POA: Diagnosis not present

## 2023-03-30 DIAGNOSIS — E440 Moderate protein-calorie malnutrition: Secondary | ICD-10-CM | POA: Diagnosis not present

## 2023-03-30 DIAGNOSIS — D638 Anemia in other chronic diseases classified elsewhere: Secondary | ICD-10-CM | POA: Diagnosis not present

## 2023-03-30 LAB — CBC
HCT: 33.4 % — ABNORMAL LOW (ref 36.0–46.0)
Hemoglobin: 10.7 g/dL — ABNORMAL LOW (ref 12.0–15.0)
MCH: 28.6 pg (ref 26.0–34.0)
MCHC: 32 g/dL (ref 30.0–36.0)
MCV: 89.3 fL (ref 80.0–100.0)
Platelets: 267 10*3/uL (ref 150–400)
RBC: 3.74 MIL/uL — ABNORMAL LOW (ref 3.87–5.11)
RDW: 15.5 % (ref 11.5–15.5)
WBC: 6.9 10*3/uL (ref 4.0–10.5)
nRBC: 0 % (ref 0.0–0.2)

## 2023-03-30 LAB — BASIC METABOLIC PANEL
Anion gap: 9 (ref 5–15)
BUN: 12 mg/dL (ref 8–23)
CO2: 19 mmol/L — ABNORMAL LOW (ref 22–32)
Calcium: 10.2 mg/dL (ref 8.9–10.3)
Chloride: 107 mmol/L (ref 98–111)
Creatinine, Ser: 0.89 mg/dL (ref 0.44–1.00)
GFR, Estimated: >60 mL/min (ref >60)
Glucose, Bld: 124 mg/dL — ABNORMAL HIGH (ref 70–99)
Potassium: 4.2 mmol/L (ref 3.5–5.1)
Sodium: 135 mmol/L (ref 135–145)

## 2023-03-30 NOTE — TOC Progression Note (Addendum)
Transition of Care [facility]) - Progression Note    Patient Details  Name: Lisa Maxwell MRN: [id number] Date of Birth: 06/15/1945  [facility]) CM/SW Contact  Michaela Corner, [place] Phone Number: 03/30/2023, 8:32 AM  Clinical Narrative:   CSW spoke with Fransico Him and was informed ins Berkley Harvey has been denied. Ins may need updated clinicals faxed ([phone]) over if family decides to do the appeal. Family has been given the option to complete a fast appeal ([phone]). CSW left VM for pts spouse, Kendell Bane, explaining the fast appeal and giving him the phone number to complete appeal. CSW updated MD.   12:11PM: CSW faxed updated documentation to ins ([phone]).   12:59PM: No update from [employer] at this time on appeal decision. Reference # [id number] for case is.  TOC will continue to follow.     Expected Discharge Plan: Skilled Nursing Facility Barriers to Discharge: Continued Medical Work up, SNF Pending bed offer  Expected Discharge Plan and Services In-house Referral: Clinical Social Work                                             Social Determinants of Health (SDOH) Interventions SDOH Screenings   Food Insecurity: No Food Insecurity (03/22/2023)  Housing: Low Risk  (03/22/2023)  Transportation Needs: No Transportation Needs (03/22/2023)  Utilities: Not At Risk (03/22/2023)  Alcohol Screen: Low Risk  (07/31/2022)  Depression (PHQ2-9): Low Risk  (07/31/2022)  Financial Resource Strain: Low Risk  (07/31/2022)  Physical Activity: Unknown (07/31/2022)  Tobacco Use: High Risk (03/24/2023)    Readmission Risk Interventions     No data to display

## 2023-03-30 NOTE — Progress Notes (Signed)
Pt removed PIV, site clean dry intact. Pt not currently on any IV medications and is pending DC to facility. Dr. Mickie Bail notified. Orders to not insert new PIV and that day team will re-assess need for IV access.

## 2023-03-30 NOTE — Progress Notes (Addendum)
Subjective:   Summary: Patient is a 77 year old woman with poor functional status, admitted for functional decline and hyponatremia related to poor oral intake/malnutrition.   Hospital Day 8  Patient pulled out her IV overnight. Husband at bedside helps provide information. She has been able to eat some protein shake but not much. Informed husband that insurance was denied but he can appeal. He is interested in appeals process but would also like to speak with palliative care regarding other options for home hospice.   Objective:  Vital signs in last 24 hours: Vitals:   03/30/23 0012 03/30/23 0419 03/30/23 0735 03/30/23 0858  BP: 117/62 121/66 121/78   Pulse: 86 95 67   Resp: 18 18 18    Temp: 98.4 F (36.9 C) 98.4 F (36.9 C) (!) 97.5 F (36.4 C)   TempSrc: Oral Oral Oral   SpO2: 97% 99% 94% 95%  Weight: 50.8 kg     Height:       Supplemental O2: Room Air SpO2: 95 %  Physical Exam:  Constitutional: chronically ill appearing, poorly oriented, slow to speak Cardiovascular: RRR Pulmonary/Chest: normal work of breathing on room air, lungs clear to auscultation bilaterally Abdominal: soft, non-tender, non-distended  Pertinent Labs:    Latest Ref Rng & Units 03/30/2023    2:51 AM 03/27/2023    2:29 AM 03/26/2023    2:59 AM  CBC  WBC 4.0 - 10.5 K/uL 6.9  6.7  6.2   Hemoglobin 12.0 - 15.0 g/dL 86.5  8.5  8.4   Hematocrit 36.0 - 46.0 % 33.4  26.3  25.6   Platelets 150 - 400 K/uL 267  308  312        Latest Ref Rng & Units 03/30/2023    2:51 AM 03/27/2023    2:29 AM 03/26/2023    2:59 AM  CMP  Glucose 70 - 99 mg/dL 784  696  295   BUN 8 - 23 mg/dL 12  13  11    Creatinine 0.44 - 1.00 mg/dL 2.84  1.32  4.40   Sodium 135 - 145 mmol/L 135  133  133   Potassium 3.5 - 5.1 mmol/L 4.2  3.7  3.5   Chloride 98 - 111 mmol/L 107  103  103   CO2 22 - 32 mmol/L 19  23  23    Calcium 8.9 - 10.3 mg/dL 10.2  9.7  9.3    Assessment/Plan:    #Malnutrition #Hyponatremia, resolved Patient continues to have poor oral intake. Dietian recommends continuing protein supplementation, however she is still not meeting caloric goals. She is medically ready for discharge, awaiting insurance authorization for SNF placement.   I feel that although this patient's long-term prognosis is poor, she could recover significantly with a short-term stay at a skilled nursing facility with rehabilitation and therapy. She has demonstrated during previous SNF stays this year that she can participate in therapy and regain functionality to the point where she can be cared for at home, with assistance. Physical and occupational therapy documentation confirms patient is eager to participate in therapy, and that a skilled nursing facility would be best equipped to continue her rehabilitation.   #Goals of care Completed MOST form with patient and husband. They do want outpatient palliative services. Family wishes patient to go to [facility] where she has stayed multiple times this year. Awaiting insurance authorization for discharge to  SNF.   #Delirium Waxing and waning mental status. Delirium precautions in place, have stopped nonessential sedating medications  Chronic stable conditions:  #Decubitus skin ulcer: Stage 1, present on admission. Wound care and manuka honey #Atrial fibrillation: Persistent, rate controlled. Continue apixaban.  #Chronic CHF: Stable, euvolemic #Suspected renal cell carcinoma: Incidental finding on CT. Evaluated by oncology. Patient will likely not be a candidate for nephrectomy.    Diet: Full liquid VTE: Eliquis Code: Full  Dispo: Anticipated discharge to Nursing Home pending insurance authorization. Stable for discharge  Monna Fam, MD PGY-1 Internal Medicine Resident Pager Number [phone] Please contact the on call pager after 5 pm and on weekends at [phone].

## 2023-03-31 DIAGNOSIS — E440 Moderate protein-calorie malnutrition: Secondary | ICD-10-CM | POA: Diagnosis not present

## 2023-03-31 DIAGNOSIS — R627 Adult failure to thrive: Secondary | ICD-10-CM

## 2023-03-31 DIAGNOSIS — Z515 Encounter for palliative care: Secondary | ICD-10-CM | POA: Diagnosis not present

## 2023-03-31 DIAGNOSIS — R1319 Other dysphagia: Secondary | ICD-10-CM | POA: Diagnosis not present

## 2023-03-31 DIAGNOSIS — Z7189 Other specified counseling: Secondary | ICD-10-CM

## 2023-03-31 NOTE — Progress Notes (Signed)
Subjective:   Summary: Patient is a 77 year old woman with poor functional status, admitted for functional decline and hyponatremia related to poor oral intake/malnutrition.   Hospital Day 9  Patient feeling well, more interactive today. She seems to have a good appetite today. Husband spoke with social work, he has some confusion about what will happen if insurance does not approve the request for SNF. Told him we would reach out to social work and palliative today to clarify.   Objective:  Vital signs in last 24 hours: Vitals:   03/30/23 1547 03/30/23 1939 03/30/23 2318 03/31/23 0320  BP: (!) 108/58 120/73 110/76 108/64  Pulse: 89 81 77 85  Resp: 20 18 16 18   Temp: 97.8 F (36.6 C) 97.9 F (36.6 C) 97.6 F (36.4 C) 97.8 F (36.6 C)  TempSrc: Oral Oral Oral Oral  SpO2: 91% 98% 94% 95%  Weight:      Height:       Supplemental O2: Room Air SpO2: 95 %  Physical Exam:  Constitutional: chronically ill appearing, poorly oriented, slow to speak Cardiovascular: RRR Pulmonary/Chest: normal work of breathing on room air, lungs clear to auscultation bilaterally Abdominal: soft, non-tender, non-distended  Pertinent Labs:    Latest Ref Rng & Units 03/30/2023    2:51 AM 03/27/2023    2:29 AM 03/26/2023    2:59 AM  CBC  WBC 4.0 - 10.5 K/uL 6.9  6.7  6.2   Hemoglobin 12.0 - 15.0 g/dL 29.9  8.5  8.4   Hematocrit 36.0 - 46.0 % 33.4  26.3  25.6   Platelets 150 - 400 K/uL 267  308  312        Latest Ref Rng & Units 03/30/2023    2:51 AM 03/27/2023    2:29 AM 03/26/2023    2:59 AM  CMP  Glucose 70 - 99 mg/dL 371  696  789   BUN 8 - 23 mg/dL 12  13  11    Creatinine 0.44 - 1.00 mg/dL 3.81  0.17  5.10   Sodium 135 - 145 mmol/L 135  133  133   Potassium 3.5 - 5.1 mmol/L 4.2  3.7  3.5   Chloride 98 - 111 mmol/L 107  103  103   CO2 22 - 32 mmol/L 19  23  23    Calcium 8.9 - 10.3 mg/dL 25.8  9.7  9.3    Assessment/Plan:   #Malnutrition #Failure to  thrive Patient continues to have poor oral intake. Dietian recommends continuing protein supplementation, however she is still not meeting caloric goals. She is medically ready for discharge, awaiting insurance authorization for SNF placement.  Family wishes patient to go to [facility] where she has stayed multiple times this year. Plan to appeal insurance denial.   #Delirium Waxing and waning mental status. Delirium precautions in place, have stopped nonessential sedating medications  Chronic stable conditions:  #Decubitus skin ulcer: Stage 1, present on admission. Wound care and manuka honey #Atrial fibrillation: Persistent, rate controlled. Continue apixaban.  #Chronic CHF: Stable, euvolemic #Suspected renal cell carcinoma: Incidental finding on CT. Evaluated by oncology. Patient will likely not be a candidate for nephrectomy.   Diet: Full liquid VTE: Eliquis Code: Full  Dispo: Anticipated discharge to Nursing Home pending insurance authorization. Stable for discharge  Monna Fam, MD PGY-1 Internal Medicine Resident Pager Number [phone] Please contact the on call pager after  5 pm and on weekends at [phone].

## 2023-03-31 NOTE — NC FL2 (Signed)
MEDICAID [facility] LEVEL OF CARE FORM     IDENTIFICATION  Patient Name: Lisa Maxwell Birthdate: 08-26-1945 Sex: female Admission Date (Current Location): 03/22/2023  [facility] and [place] Number:  Producer, television/film/video and Address:  [facility] [place] [address] [facility], [address], [place], [place] [address]      Provider Number: [id number]  Attending Physician Name and Address:  Ginnie Smart, MD  Relative Name and Phone Number:       Current Level of Care: Hospital Recommended Level of Care: Skilled Nursing Facility, Other (Comment) (Long term care) Prior Approval Number:    Date Approved/Denied:   PASRR Number: [id number]  Discharge Plan: SNF (LTC)    Current Diagnoses: Patient Active Problem List   Diagnosis Date Noted   Anemia, chronic disease 03/29/2023   Delirium 03/28/2023   Malnutrition of moderate degree 03/26/2023   Hyponatremia 03/22/2023   Chronic CHF (congestive heart failure) (HCC) 03/22/2023   Hypercalcemia 12/01/2022   Hyperlipidemia 12/01/2022   Low back pain 11/30/2022   Recurrent UTI 11/26/2022   Medication management 11/26/2022   Urinary tract infection without hematuria 11/17/2022   Esophageal stricture 06/17/2022   Anorexia 06/17/2022   Abnormal barium swallow 06/17/2022   Other dysphagia 06/17/2022   L1 vertebral fracture (HCC) 06/09/2022   Esophageal abnormality 06/09/2022   History of pulmonary embolus (PE) 05/28/2022   Tinea unguium 02/03/2022   History of pulmonary embolism 11/28/2021   Pulmonary embolism (HCC) 11/21/2021   Decubitus skin ulcer 11/21/2021   Cauda equina compression (HCC) 08/28/2021   Class 1 obesity due to excess calories with serious comorbidity and body mass index (BMI) of 34.0 to 34.9 in adult 05/20/2021   Dyshidrotic eczema 04/29/2021   DM2 (diabetes mellitus, type 2) (HCC) 04/29/2021   Chronic, continuous use of opioids 03/04/2021   Diabetes (HCC) 11/08/2020   Hypertension 11/08/2020   Muscle  pain 04/29/2020   Cancer (HCC)    Cerebrovascular disease    Chronic kidney disease    Dysrhythmia    Heart murmur    Osteoarthritis of left knee    OSA (obstructive sleep apnea)    S/P insertion of spinal cord stimulator 07/13/2019   Preop cardiovascular exam 05/01/2019   Pain management contract agreement 04/13/2019   Spondylolisthesis 03/20/2019   Lumbar spondylosis 03/20/2019   Spondylosis of lumbosacral region without myelopathy or radiculopathy 01/30/2019   DDD (degenerative disc disease), lumbar 11/17/2018   Facet joint disease 11/17/2018   History of knee replacement, total, bilateral 11/17/2018   Type 2 diabetes mellitus (HCC) 09/28/2018   Hypertensive disorder 09/28/2018   COPD (chronic obstructive pulmonary disease) (HCC) 09/28/2018   Increased body mass index 09/28/2018   Smoker 09/28/2018   Chronic renal failure 09/28/2018   Peripheral vascular disease (HCC) 09/28/2018   Atrial fibrillation, chronic (HCC) 09/28/2018   Chronic bilateral low back pain without sciatica 03/08/2018   Chronic pain syndrome 03/08/2018   Facet arthritis of lumbar region 03/08/2018   Foraminal stenosis of lumbar region 03/08/2018   Lumbar radiculopathy 03/08/2018   Spinal stenosis of lumbar region with neurogenic claudication 03/08/2018   CAD (coronary artery disease) 11/04/2016   Essential hypertension 11/04/2016   Diabetes mellitus due to underlying condition with unspecified complications (HCC) 11/04/2016   Mixed dyslipidemia 11/04/2016   Morbid obesity (HCC) 11/04/2016   Atrial fibrillation (HCC) 04/30/2015   Renal insufficiency 04/30/2015   CKD stage 3b, GFR 30-44 ml/min (HCC)     Orientation RESPIRATION BLADDER Height & Weight  Self  Normal Continent, Indwelling catheter Weight: 111 lb 15.9 oz (50.8 kg) Height:  4\' 8"  (142.2 cm)  BEHAVIORAL SYMPTOMS/MOOD NEUROLOGICAL BOWEL NUTRITION STATUS      Continent Diet (see dc summary)  AMBULATORY STATUS COMMUNICATION OF NEEDS Skin     (see dc summary) Verbally                         Personal Care Assistance Level of Assistance  Bathing, Feeding, Dressing Bathing Assistance:  (see dc summary) Feeding assistance: Limited assistance Dressing Assistance:  (see dc summary)     Functional Limitations Info  Sight, Hearing, Speech Sight Info: Impaired (Glasses) Hearing Info: Adequate Speech Info: Adequate    SPECIAL CARE FACTORS FREQUENCY  PT (By licensed PT), OT (By licensed OT), Speech therapy     PT Frequency: 5x week OT Frequency: 5x week     Speech Therapy Frequency: 5x week      Contractures Contractures Info: Not present    Additional Factors Info  Code Status, Insulin Sliding Scale Code Status Info: Full     Insulin Sliding Scale Info: see dc summary       Current Medications (03/31/2023):  This is the current hospital active medication list Current Facility-Administered Medications  Medication Dose Route Frequency Provider Last Rate Last Admin   acetaminophen (TYLENOL) tablet 650 mg  650 mg Oral Q6H PRN Monna Fam, MD   650 mg at 03/31/23 8657   apixaban (ELIQUIS) tablet 5 mg  5 mg Oral BID Marrianne Mood, MD   5 mg at 03/31/23 8469   Chlorhexidine Gluconate Cloth 2 % PADS 6 each  6 each Topical Daily Pyrtle, Carie Caddy, MD   6 each at 03/31/23 0843   fluticasone furoate-vilanterol (BREO ELLIPTA) 100-25 MCG/ACT 1-2 puff  1-2 puff Inhalation Daily Marrianne Mood, MD   1 puff at 03/31/23 0750   leptospermum manuka honey (MEDIHONEY) paste 1 Application  1 Application Topical Daily Reymundo Poll, MD   1 Application at 03/31/23 0843   lidocaine (LIDODERM) 5 % 1 patch  1 patch Transdermal Q24H Monna Fam, MD   1 patch at 03/30/23 1247   mirtazapine (REMERON SOL-TAB) disintegrating tablet 15 mg  15 mg Oral QHS Marrianne Mood, MD   15 mg at 03/30/23 2120   multivitamin liquid 15 mL  15 mL Oral Daily Reymundo Poll, MD   15 mL at 03/31/23 6295   nystatin cream (MYCOSTATIN) 1  Application  1 Application Topical BID Marrianne Mood, MD   1 Application at 03/31/23 0842   pantoprazole (PROTONIX) EC tablet 40 mg  40 mg Oral Daily Marrianne Mood, MD   40 mg at 03/31/23 0842   pravastatin (PRAVACHOL) tablet 40 mg  40 mg Oral q1800 Marrianne Mood, MD   40 mg at 03/30/23 1951   sodium chloride flush (NS) 0.9 % injection 3 mL  3 mL Intravenous PRN Marrianne Mood, MD         Discharge Medications: Please see discharge summary for a list of discharge medications.  Relevant Imaging Results:  Relevant Lab Results:   Additional Information SS#: [id number]; family is looking to private pay for LTC  Michaela Corner, [place]

## 2023-03-31 NOTE — Plan of Care (Signed)
  Problem: Clinical Measurements: Goal: Ability to maintain clinical measurements within normal limits will improve Outcome: Progressing Goal: Cardiovascular complication will be avoided Outcome: Progressing   Problem: Coping: Goal: Level of anxiety will decrease Outcome: Progressing   Problem: Pain Management: Goal: General experience of comfort will improve Outcome: Progressing   Problem: Health Behavior/Discharge Planning: Goal: Ability to manage health-related needs will improve Outcome: Not Progressing   Problem: Activity: Goal: Risk for activity intolerance will decrease Outcome: Not Progressing   Problem: Nutrition: Goal: Adequate nutrition will be maintained Outcome: Not Progressing

## 2023-03-31 NOTE — Consult Note (Signed)
Consultation Note Date: 03/31/2023   Patient Name: Lisa Maxwell  DOB: 03-21-1946  MRN: [id number]  Age / Sex: 77 y.o., female  PCP: Paulina Fusi, MD Referring Physician: Ginnie Smart, MD  Reason for Consultation: {Reason for Consult:23484}  HPI/Patient Profile: 77 y.o. female  with past medical history of *** admitted on 03/22/2023 with ***.   Clinical Assessment and Goals of Care: ***  Primary Decision Maker {Primary Decision NWGNF:62130}    SUMMARY OF RECOMMENDATIONS   ***  Code Status/Advance Care Planning: {Palliative Code status:23503}   Symptom Management:  ***  Palliative Prophylaxis:  {Palliative Prophylaxis:21015}  Additional Recommendations (Limitations, Scope, Preferences): {Recommended Scope and Preferences:21019}  Psycho-social/Spiritual:  Desire for further Chaplaincy support:{YES NO:22349} Additional Recommendations: {PAL SOCIAL:21064}  Prognosis:  {Palliative Care Prognosis:23504}  Discharge Planning: {Palliative dispostion:23505}      Primary Diagnoses: Present on Admission:  (Resolved) Aspiration pneumonia (HCC)  COPD (chronic obstructive pulmonary disease) (HCC)  Decubitus skin ulcer  Atrial fibrillation (HCC)   I have reviewed the medical record, interviewed the patient and family, and examined the patient. The following aspects are pertinent.  Past Medical History:  Diagnosis Date   Aspiration pneumonia (HCC) 03/22/2023   Atrial fibrillation with RVR (HCC) 03/22/2023   CAD (coronary artery disease) 11/04/2016   Cancer (HCC)    skin cancer   Cauda equina compression (HCC) 08/28/2021   Cerebrovascular disease    Chronic atrial fibrillation (HCC) 09/28/2018   Chronic bilateral low back pain without sciatica 03/08/2018   Chronic kidney disease    Chronic obstructive lung disease (HCC) 09/28/2018   Chronic pain syndrome 03/08/2018    Chronic renal failure 09/28/2018   Chronic, continuous use of opioids 03/04/2021   Last Assessment & Plan:  Formatting of this note might be different from the original. Patient and I have discussed the hazardous effects of continued opiate pain medication usage. Risks and benefits of above medications including but not limited to possibility of respiratory depression, sedation, and even death were discussed with the patient who expressed an understanding.  Patient did not displ   CKD (chronic kidney disease) stage 3, GFR 30-59 ml/min (HCC)    Class 1 obesity due to excess calories with serious comorbidity and body mass index (BMI) of 34.0 to 34.9 in adult 05/20/2021   Last Assessment & Plan: Formatting of this note might be different from the original. Patient educated about the detrimental effects of weight as it specifically pertains to pain management and overall health.  Patient's BMI 34.08 Encouraged healthy eating habits and routine low-impact cardiovascular exercises as tolerated.   Comprehensive diabetic foot examination, type 2 DM, encounter for [facility]) 04/29/2021   DDD (degenerative disc disease), lumbar 11/17/2018   Last Assessment & Plan:  Formatting of this note might be different from the original. See spinal stenosis plan   Diabetes (HCC)    Diabetes mellitus due to underlying condition with unspecified complications (HCC) 11/04/2016   Dyshidrotic eczema 04/29/2021   Dysrhythmia    afib   Essential hypertension  11/04/2016   Facet arthritis of lumbar region 03/08/2018   Facet joint disease 11/17/2018   Last Assessment & Plan:  Formatting of this note might be different from the original. See spinal stenosis plan   Foraminal stenosis of lumbar region 03/08/2018   Heart murmur    History of knee replacement, total, bilateral 11/17/2018   Last Assessment & Plan:  Formatting of this note might be different from the original. Have recommended to her several times if any further concerns  in regards to the pain status post arthroplasty, she is to follow up with her surgeon.   History of pulmonary embolism 11/28/2021   Hypertension    Hypertensive disorder 09/28/2018   Increased body mass index 09/28/2018   Lumbar radiculopathy 03/08/2018   Lumbar spondylosis 03/20/2019   Mixed dyslipidemia 11/04/2016   Morbid obesity (HCC) 11/04/2016   Muscle pain 04/29/2020   Osteoarthritis of left knee    PAF (paroxysmal atrial fibrillation) (HCC) 04/30/2015   Pain management contract agreement 04/13/2019   Last Assessment & Plan:  Formatting of this note might be different from the original. Contract updated today.  UDS completed.  [place] controlled substance registry reviewed and is consistent with her regimen.   Peripheral vascular disease (HCC) 09/28/2018   Preop cardiovascular exam 05/01/2019   Pressure injury of skin 11/21/2021   Pulmonary emboli [facility])    Pulmonary embolism (HCC) 11/21/2021   Renal insufficiency 04/30/2015   S/P insertion of spinal cord stimulator 07/13/2019   Last Assessment & Plan:  Formatting of this note might be different from the original. About 5 months status post insertion of spinal cord stimulator with excellent relief.  She continues to keep close follow-up with her representative and has been working to appropriately find best programs to get her adequate pain relief.   Sleep apnea    Smoker 09/28/2018   Spinal stenosis of lumbar region 03/20/2019   Spinal stenosis of lumbar region with neurogenic claudication 03/08/2018   Last Assessment & Plan:  Formatting of this note is different from the original. 77 year old female with chronic low back pain and bilateral, right greater than left, L4-5 radicular pain, and claudication. She has substantial amount of multifactorial degenerative thoracic and lumbar spine pathology.  After being deemed an inappropriate open surgical candidate, and failing to respond to multiple in   Spondylolisthesis 03/20/2019    Spondylosis of lumbosacral region without myelopathy or radiculopathy 01/30/2019   Last Assessment & Plan:  Formatting of this note might be different from the original. See spinal stenosis plan   Tinea unguium 02/03/2022   Type 2 diabetes mellitus (HCC) 09/28/2018   Social History   Socioeconomic History   Marital status: Married    Spouse name: Not on file   Number of children: 2   Years of education: Not on file   Highest education level: Not on file  Occupational History   Not on file  Tobacco Use   Smoking status: Some Days    Current packs/day: 0.00    Average packs/day: 0.5 packs/day for 62.0 years (31.0 ttl pk-yrs)    Types: Cigarettes    Start date: 05/07/1957    Last attempt to quit: 05/08/2019    Years since quitting: 3.8    Passive exposure: Past   Smokeless tobacco: Never  Vaping Use   Vaping status: Never Used  Substance and Sexual Activity   Alcohol use: No   Drug use: No   Sexual activity: Not Currently  Other Topics Concern  Not on file  Social History Narrative   Not on file   Social Drivers of Health   Financial Resource Strain: Low Risk  (07/31/2022)   Overall Financial Resource Strain (CARDIA)    Difficulty of Paying Living Expenses: Not hard at all  Food Insecurity: No Food Insecurity (03/22/2023)   Hunger Vital Sign    Worried About Running Out of Food in the Last Year: Never true    Ran Out of Food in the Last Year: Never true  Transportation Needs: No Transportation Needs (03/22/2023)   PRAPARE - Administrator, Civil Service (Medical): No    Lack of Transportation (Non-Medical): No  Physical Activity: Unknown (07/31/2022)   Exercise Vital Sign    Days of Exercise per Week: Not on file    Minutes of Exercise per Session: 0 min  Stress: Not on file  Social Connections: Not on file   Family History  Problem Relation Age of Onset   Diabetes Mother    Scheduled Meds:  apixaban  5 mg Oral BID   Chlorhexidine Gluconate Cloth  6  each Topical Daily   fluticasone furoate-vilanterol  1-2 puff Inhalation Daily   leptospermum manuka honey  1 Application Topical Daily   lidocaine  1 patch Transdermal Q24H   mirtazapine  15 mg Oral QHS   multivitamin  15 mL Oral Daily   nystatin cream  1 Application Topical BID   pantoprazole  40 mg Oral Daily   pravastatin  40 mg Oral q1800   Continuous Infusions: PRN Meds:.acetaminophen, sodium chloride flush No Known Allergies Review of Systems  Physical Exam  Vital Signs: BP 125/66 (BP Location: Right Arm)   Pulse 93   Temp 98.5 F (36.9 C) (Oral)   Resp 18   Ht 4\' 8"  (1.422 m)   Wt 50.8 kg   SpO2 98%   BMI 25.11 kg/m  Pain Scale: 0-10   Pain Score: Asleep   SpO2: SpO2: 98 % O2 Device:SpO2: 98 % O2 Flow Rate: .   IO: Intake/output summary:  Intake/Output Summary (Last 24 hours) at 03/31/2023 1141 Last data filed at 03/30/2023 1830 Gross per 24 hour  Intake 50 ml  Output 240 ml  Net -190 ml    LBM: Last BM Date : 03/30/23 Baseline Weight: Weight: 65.3 kg Most recent weight: Weight: 50.8 kg     Palliative Assessment/Data:     Time In: *** Time Out: *** Time Total: *** Greater than 50%  of this time was spent counseling and coordinating care related to the above assessment and plan.  Signed by: Yong Channel, NP Palliative Medicine Team Pager # [phone] (M-F 8a-5p) Team Phone # [phone] (Nights/Weekends)

## 2023-03-31 NOTE — TOC Progression Note (Addendum)
Transition of Care [facility]) - Progression Note    Patient Details  Name: Lisa Maxwell MRN: [id number] Date of Birth: 04/17/1945  Transition of Care [facility]) CM/SW Contact  Michaela Corner, [place] Phone Number: 03/31/2023, 9:52 AM  Clinical Narrative:   CSW spoke with pt and husband per MD request to explain dispo after appeal is denied. CSW explained that if pt is deemed medically clear and appeal is denied then pt will have to dc home. CSW gave Mr. Leonetti personal care resources at this time. Mr.Lannan inquired about long term care for pt, CSW explained the payor source for LTC is Medicaid. Mr. Gajjar states that at this time they do not meet the income requirements to be eligible for medicaid. Per MD, palliative will be consulted.   TOC will continue to follow.     Expected Discharge Plan: Skilled Nursing Facility Barriers to Discharge: Continued Medical Work up, SNF Pending bed offer  Expected Discharge Plan and Services In-house Referral: Clinical Social Work                                             Social Determinants of Health (SDOH) Interventions SDOH Screenings   Food Insecurity: No Food Insecurity (03/22/2023)  Housing: Low Risk  (03/22/2023)  Transportation Needs: No Transportation Needs (03/22/2023)  Utilities: Not At Risk (03/22/2023)  Alcohol Screen: Low Risk  (07/31/2022)  Depression (PHQ2-9): Low Risk  (07/31/2022)  Financial Resource Strain: Low Risk  (07/31/2022)  Physical Activity: Unknown (07/31/2022)  Tobacco Use: High Risk (03/24/2023)    Readmission Risk Interventions     No data to display

## 2023-04-01 DIAGNOSIS — E440 Moderate protein-calorie malnutrition: Secondary | ICD-10-CM | POA: Diagnosis not present

## 2023-04-01 DIAGNOSIS — Z7189 Other specified counseling: Secondary | ICD-10-CM | POA: Diagnosis not present

## 2023-04-01 DIAGNOSIS — R627 Adult failure to thrive: Secondary | ICD-10-CM | POA: Diagnosis not present

## 2023-04-01 DIAGNOSIS — R1319 Other dysphagia: Secondary | ICD-10-CM | POA: Diagnosis not present

## 2023-04-01 DIAGNOSIS — Z515 Encounter for palliative care: Secondary | ICD-10-CM | POA: Diagnosis not present

## 2023-04-01 NOTE — Progress Notes (Addendum)
Speech Language Pathology Treatment: Dysphagia  Patient Details Name: Lisa Maxwell MRN: [id number] DOB: 1945-04-29 Today's Date: 04/01/2023 Time: 1324-4010 SLP Time Calculation (min) (ACUTE ONLY): 34 min  Assessment / Plan / Recommendation Clinical Impression  Pt seen today to address dysphagia goals.  Pt greeted lying on her left side due to having spinal implant stimulator charging and for that she must lay down *can take up to 3 hours for charging.    Pt's dentures are present today and she eats with them in at home *uses adhesive.  SLP reviewed MBS flouroscopy loops with pt and spouse, detailing clinical reasoning for compensation strategies and diet using teach back.  Spouse able to explain pt's dysphagia largely being impairment throat muscles.    Initiated exercise to improve hyo-laryngeal elevation strength *for laryngeal closure/airway protection*.  Pt able to demonstrate initially with max cues - fading to mod.  SLP instructed her spouse to exercise also using demonstration with palpation.    Coupling MBS results and clinical appearance of pt, SLP provided treatment.  At this time, given pt has her dentures here and was protective of her airway with thin via tsp- coupled by strong voice and adequate articulation -  - recommend advance diet to Dys3/nectar - allowing tsps of thin any time.  New signs posted with new recommendations.  Pt did not have dentures in place for her  MBS and eats with them at home, thus can likely manage advancement well.   Thanks for referral.     HPI HPI: Pt is a 77 yo female presenting with generalized weakness and poor oral intake. Per MD H&P, husband reports 2-3 weeks of trouble swallowing, described as food feeling stuck in her throat (consistent with previous stricture), regurgitation with inability to keep food down, and coughing "sometimes" with thin liquids. Pt had swallow studies at [facility] the week PTA . Full report not available but per  husband, she could swallow limited amounts on MBS fine, but after the applesauce she started to regurgitate. He said the SLP described it as a "clogged drain" through which limited amounts were passing, but this was not seen on esopahgram later that week. Pt was previously seen by SLP at [facility] in March 2024 with clinical presentation improved after esophageal dilation; no s/s of aspiration. MRI 12/19 negative for acute findings. PMH includes: esophageal stricture s/p dilation procedures, prior saddle embolus, afib, HFpEF, DMII, CAD, chronic low back pain s/p spinal cord stimulator  Pt is s/p MBS here that showed silent aspiration of thin and nectar liquids.      SLP Plan  Continue with current plan of care      Recommendations for follow up therapy are one component of a multi-disciplinary discharge planning process, led by the attending physician.  Recommendations may be updated based on patient status, additional functional criteria and insurance authorization.    Recommendations  Diet recommendations: Dysphagia 3 (mechanical soft);Nectar-thick liquid (tsps of thin approved with fully upright position) Liquids provided via: Teaspoon;Cup;No straw Medication Administration: Crushed with puree Supervision: Trained caregiver to feed patient;Full supervision/cueing for compensatory strategies Compensations: Slow rate;Small sips/bites;Other (Comment) (start intake with liquids) Postural Changes and/or Swallow Maneuvers: Seated upright 90 degrees;Upright 30-60 min after meal                  Oral care BID   Frequent or constant Supervision/Assistance Dysphagia, pharyngeal phase (R13.13)     Continue with current plan of care    Tammy K, MS [facility]  SLP Acute Rehab Services Office [phone]  Chales Abrahams  04/01/2023, 9:20 AM

## 2023-04-01 NOTE — Discharge Summary (Deleted)
Name: Lisa Maxwell MRN: [id number] DOB: 1946/03/25 77 y.o. PCP: Paulina Fusi, MD  Date of Admission: 03/22/2023  1:09 PM Date of Discharge:  Attending Physician: Dr. Mikey Bussing  Discharge Diagnosis: Principal Problem:   Malnutrition of moderate degree Active Problems:   Atrial fibrillation (HCC)   COPD (chronic obstructive pulmonary disease) (HCC)   Decubitus skin ulcer   Other dysphagia   Hyponatremia   Chronic CHF (congestive heart failure) (HCC)   Delirium   Anemia, chronic disease    Discharge Medications: Allergies as of 04/01/2023   No Known Allergies      Medication List     STOP taking these medications    bethanechol 50 MG tablet Commonly known as: URECHOLINE   furosemide 40 MG tablet Commonly known as: LASIX   gabapentin 600 MG tablet Commonly known as: NEURONTIN   Klor-Con M20 20 MEQ tablet Generic drug: potassium chloride SA   lovastatin 40 MG tablet Commonly known as: MEVACOR Replaced by: pravastatin 40 MG tablet   methocarbamol 500 MG tablet Commonly known as: ROBAXIN   naloxone 4 MG/0.1ML Liqd nasal spray kit Commonly known as: NARCAN   Oxycodone HCl 10 MG Tabs   Rybelsus 3 MG Tabs Generic drug: Semaglutide   spironolactone 25 MG tablet Commonly known as: ALDACTONE   tamsulosin 0.4 MG Caps capsule Commonly known as: FLOMAX       TAKE these medications    albuterol 108 (90 Base) MCG/ACT inhaler Commonly known as: VENTOLIN HFA Inhale into the lungs every 6 (six) hours as needed for wheezing or shortness of breath.   Centrum Silver 50+Women Tabs Take 1 tablet by mouth daily with breakfast.   Co Q-10 200 MG Caps Take 200 mg by mouth in the morning.   docusate sodium 100 MG capsule Commonly known as: COLACE Take 100 mg by mouth 2 (two) times daily as needed for mild constipation or moderate constipation.   Eliquis 5 MG Tabs tablet Generic drug: apixaban TAKE 1 TABLET BY MOUTH TWICE A DAY   fluticasone  furoate-vilanterol 100-25 MCG/ACT Aepb Commonly known as: BREO ELLIPTA Inhale 1-2 puffs into the lungs daily.   Iron (Ferrous Sulfate) 325 (65 Fe) MG Tabs Take 65 mg of iron by mouth daily with breakfast.   latanoprost 0.005 % ophthalmic solution Commonly known as: XALATAN Place 1 drop into both eyes at bedtime.   leptospermum manuka honey Pste paste Apply 1 Application topically daily.   lidocaine 5 % Commonly known as: LIDODERM Place 1 patch onto the skin daily. Remove & Discard patch within 12 hours or as directed by MD   mirtazapine 15 MG disintegrating tablet Commonly known as: REMERON SOL-TAB Take 1 tablet (15 mg total) by mouth at bedtime.   nitroGLYCERIN 0.4 MG SL tablet Commonly known as: NITROSTAT Place 0.4 mg under the tongue every 5 (five) minutes as needed for chest pain.   nystatin cream Commonly known as: MYCOSTATIN Apply 1 Application topically 2 (two) times daily.   pantoprazole 40 MG tablet Commonly known as: PROTONIX Take 1 tablet (40 mg total) by mouth daily. What changed: when to take this   pravastatin 40 MG tablet Commonly known as: PRAVACHOL Take 1 tablet (40 mg total) by mouth daily at 6 PM. Replaces: lovastatin 40 MG tablet   QC TUMERIC COMPLEX PO Take 1 tablet by mouth at bedtime.   triamcinolone ointment 0.5 % Commonly known as: KENALOG Apply 1 Application topically at bedtime.   Vitamin B-12 500 MCG Subl Place  500 mcg under the tongue in the morning.        Disposition and follow-up:   Lisa Maxwell was discharged from [facility] in Stable condition.  At the hospital follow up visit please address:  1.  Follow-up:  - Address malnutrition and how patient has been eating. Consider additional dietary supplementation.    - Possible RCC detected on CT. Patient unlikely to be a candidate for treatment.   2.  Labs / imaging needed at time of follow-up: None  3.  Pending labs/ test needing follow-up: None  4.   Medication Changes  Stopped: Bethanechol, furosemide, gabapentin, Kcl, lovastatin, Robaxin, naloxone, oxycodone, Rybelsus, spironolactone, tamsulosin  Follow-up Appointments:  Contact information for follow-up providers     Paulina Fusi, MD.   Specialty: Internal Medicine Contact information: [address] [place] [address] [phone]              Contact information for after-discharge care     Destination     [facility] .   Service: Skilled Nursing Contact information: [address] [place] [address] [phone]                     Hospital Course by problem list:  #Aspiration pneumonia, resolved #Hx esophageal stricture s/p dilation #Schatzki ring, s/p dilation Patient presented with generalized weakness and poor oral intake in the setting of known esophageal strictures. She had had worsening of chronic esophageal dysphagia for the past 3 weeks. Presented afebrile and with a leukocytosis to 12.2. CXR at admission showed ovoid right upper lung opacity possibly representing acute pulmonary infiltrate, confirmed by CT chest showing multifocal ground-glass and consolidative infiltrates scattered throughout the lungs bilaterally. Patient's overall clinical picture was concerning for aspiration pneumonia. She was started on Zosyn, then transitioned to and completed a course of Unasyn. GI was consulted who found a non-obstructing Schatzki ring at the gastroesophageal junction; dilation was performed. Patient was able transition to full liquid diet without further concern for aspiration. Patient's chronic malnutrition and failure to thrive make her overall prognosis poor. Palliative care was consulted during admission who will hopefully continue to help with ongoing goals of care discussion after discharge.    #Hyponatremia Patient presented with a sodium of 125 in the setting of poor  po intake and chronic malnutrition. Sodium was trended with daily labs, which improved as her esophageal strictures were dilated and aspiration pneumonia was treated. Hyponatremia resolved with resumption of normal diet.    #Delirium Patient developed hospital delirium with waxing and waning mental status throughout admission. Delirium precautions were put in place and centrally acting medications were held.    #Atrial fibrillation Patient was in atrial fibrillation on presentation with tachycardia into the 110s-120s, possibly worsened in the setting of acute illness and dehydration. Home Eliquis 5mg  BID continued throughout admission.    #COPD Patient's home Breo Ellipta and albuterol were continued during admission.    #HFpEF (EF 55-60%) Home spironolactone was held, home Lasix continued. Patient was euvolemic and not needing accessory oxygen on day of discharge.    #Right kidney mass, suspected primary RCC CT Abdomen showed a lobulated enhancing mass seen within the right kidney measuring at least 2.1 x 2.8x 4.1 cm compatible with a primary renal cell carcinoma with no evidence of regional nodal or distant metastatic disease. Patient was seen by oncology who recommended further evaluation by urology. Further workup deferred to outpatient  setting given acute illness.    #T2DM Patient's home Rybelsus was held during admission.    #CAD Home statin was continued during admission   #GERD Home PPI continued during admission   Discharge Subjective: Patient feeling similar to previous, no new complaints today. Spoke with husband extensively about options for long-term care facility and hospice. He will continue to speak with social work and palliative about the best option for her declining health and poor prognosis.   Discharge Exam:   BP (!) 118/52 (BP Location: Right Arm)   Pulse 83   Temp (!) 97.5 F (36.4 C) (Oral)   Resp 18   Ht 4\' 8"  (1.422 m)   Wt 49 kg   SpO2 100%   BMI  24.22 kg/m  Constitutional: chronically ill appearing, poorly oriented, slow to speak Cardiovascular: RRR Pulmonary/Chest: normal work of breathing on room air, lungs clear to auscultation bilaterally Abdominal: soft, non-tender, non-distended  Pertinent Labs, Studies, and Procedures:     Latest Ref Rng & Units 03/30/2023    2:51 AM 03/27/2023    2:29 AM 03/26/2023    2:59 AM  CBC  WBC 4.0 - 10.5 K/uL 6.9  6.7  6.2   Hemoglobin 12.0 - 15.0 g/dL 40.9  8.5  8.4   Hematocrit 36.0 - 46.0 % 33.4  26.3  25.6   Platelets 150 - 400 K/uL 267  308  312        Latest Ref Rng & Units 03/30/2023    2:51 AM 03/27/2023    2:29 AM 03/26/2023    2:59 AM  CMP  Glucose 70 - 99 mg/dL 811  914  782   BUN 8 - 23 mg/dL 12  13  11    Creatinine 0.44 - 1.00 mg/dL 9.56  2.13  0.86   Sodium 135 - 145 mmol/L 135  133  133   Potassium 3.5 - 5.1 mmol/L 4.2  3.7  3.5   Chloride 98 - 111 mmol/L 107  103  103   CO2 22 - 32 mmol/L 19  23  23    Calcium 8.9 - 10.3 mg/dL 57.8  9.7  9.3     CT HEAD WO CONTRAST ( ) Result Date: 03/24/2023 CLINICAL DATA:  Dysphagia EXAM: CT HEAD WITHOUT CONTRAST TECHNIQUE: Contiguous axial images were obtained from the base of the skull through the vertex without intravenous contrast. RADIATION DOSE REDUCTION: This exam was performed according to the departmental dose-optimization program which includes automated exposure control, adjustment of the mA and/or kV according to patient size and/or use of iterative reconstruction technique. COMPARISON:  None Available. FINDINGS: Brain: No mass, hemorrhage or extra-axial collection. There is generalized volume loss. There is hypoattenuation of the bilateral supratentorial white matter. Old left occipital infarct. Vascular: There is atherosclerotic calcification of both internal carotid arteries at the skull base. Skull: Normal. Negative for fracture or focal lesion. Sinuses/Orbits: No acute finding. Other: None. IMPRESSION: 1. No acute  intracranial abnormality. 2. Old left occipital infarct and findings of chronic small vessel ischemia. Electronically Signed   By: Deatra Robinson M.D.   On: 03/24/2023 02:30   DG Chest Port 1 View Result Date: 03/22/2023 CLINICAL DATA:  Weakness EXAM: PORTABLE CHEST 1 VIEW COMPARISON:  12/05/2022 FINDINGS: Ascending thoracic stimulator leads with similar positioning. Cardiac enlargement with mild central congestion. No pleural effusion. No pneumothorax. Ovoid right upper lung opacity. IMPRESSION: 1. Cardiac enlargement with mild central congestion. 2. Ovoid right upper lung opacity, this could represent acute pulmonary infiltrate with  lung nodule not excluded. Short interval radiographic follow-up is recommended. Electronically Signed   By: Jasmine Pang M.D.   On: 03/22/2023 16:26   Signed: Monna Fam, MD PGY-1 04/01/2023, 10:45 AM   Pager: [phone]

## 2023-04-01 NOTE — Progress Notes (Signed)
Palliative:  HPI: 77 y.o. female  with past medical history of esophageal strictures s/p dilation, saddle embolus 11/2021, atrial fibrillation on Eliquis, HFpEF, diabetes, CAD, chronic low back pain with spinal cord stimulator, suspected, failure to thrive admitted on 03/22/2023 with decreased intake s/p EGD with dilation. Hospitalization complicated by aspiration pneumonia, delirium, concern for renal cell carcinoma and ongoing failure to thrive.  I met today with Lisa Maxwell and husband, Lisa Maxwell. We had an extensive discussion regarding goals of care. We discussed her health decline and explained concern for failure to thrive and what this means. We discussed goals and quality of life with potential options and scenarios. They are hopeful for placement in [facility] but for long term care side. [name] talks of other patients that she met and bonded with during other stays there. She looks forward to seeing them again - some she has stayed in contact with even after leaving rehab. We thoroughly discussed MOST form and completed: DNR, limited interventions (consider rehospitalization and avoid ICU), consider benefits/limitations of antibiotics when infection occurs, IVF trial, no feeding tube. Each decision on MOST discussed in detail and deliberated. We spent time discussing the risks vs benefits of rehospitalization vs comfort focused care. They are open to further conversation and guidance to assist with decisions for rehospitalization or escalation of care depending on expectations and progression of her health journey.   All questions/concerns addressed. Therapeutic listening. Emotional support provided.   Exam: Alert, mostly oriented and appropriate. No distress. Frail. Breathing regular, unlabored. Abd soft.   Plan: - DNR - MOST form completed - Hopeful for placement  80 min  Yong Channel, NP Palliative Medicine Team Pager [phone] (Please see [email or web address] for schedule) Team Phone [phone]

## 2023-04-01 NOTE — Plan of Care (Signed)
Cognitive barrier. Plan discussed with spouse

## 2023-04-01 NOTE — Progress Notes (Signed)
                  Subjective:   Summary: Patient is a 77 year old woman with poor functional status, admitted for functional decline and hyponatremia related to poor oral intake/malnutrition.   Hospital Day 10  Patient feeling similar to previous, no new complaints today. Spoke with husband extensively about options for long-term care facility and hospice. He will continue to speak with social work and palliative about the best option for her declining health and poor prognosis.   Objective:  Vital signs in last 24 hours: Vitals:   03/31/23 2047 04/01/23 0100 04/01/23 0702 04/01/23 0703  BP: 115/77 113/67  116/69  Pulse: 66 69  97  Resp:      Temp: 98.3 F (36.8 C) 98 F (36.7 C)    TempSrc: Axillary Axillary    SpO2: 99% 98%  98%  Weight:   49 kg   Height:       Supplemental O2: Room Air SpO2: 98 %  Physical Exam:  Constitutional: chronically ill appearing, poorly oriented, slow to speak Cardiovascular: RRR Pulmonary/Chest: normal work of breathing on room air, lungs clear to auscultation bilaterally Abdominal: soft, non-tender, non-distended  Pertinent Labs:    Latest Ref Rng & Units 03/30/2023    2:51 AM 03/27/2023    2:29 AM 03/26/2023    2:59 AM  CBC  WBC 4.0 - 10.5 K/uL 6.9  6.7  6.2   Hemoglobin 12.0 - 15.0 g/dL 82.9  8.5  8.4   Hematocrit 36.0 - 46.0 % 33.4  26.3  25.6   Platelets 150 - 400 K/uL 267  308  312        Latest Ref Rng & Units 03/30/2023    2:51 AM 03/27/2023    2:29 AM 03/26/2023    2:59 AM  CMP  Glucose 70 - 99 mg/dL 562  130  865   BUN 8 - 23 mg/dL 12  13  11    Creatinine 0.44 - 1.00 mg/dL 7.84  6.96  2.95   Sodium 135 - 145 mmol/L 135  133  133   Potassium 3.5 - 5.1 mmol/L 4.2  3.7  3.5   Chloride 98 - 111 mmol/L 107  103  103   CO2 22 - 32 mmol/L 19  23  23    Calcium 8.9 - 10.3 mg/dL 28.4  9.7  9.3    Assessment/Plan:   #Malnutrition #Failure to thrive Patient continues to have poor oral intake. She is medically ready for  discharge, however patient's husband is still deciding whether she will go to a long-term care facility or will start hospice care. Working with social work and palliative care to determine best option for where to go after discharge.   #Delirium Waxing and waning mental status. Delirium precautions in place, have stopped nonessential sedating medications  Chronic stable conditions:  #Decubitus skin ulcer: Stage 1, present on admission. Wound care and manuka honey #Atrial fibrillation: Persistent, rate controlled. Continue apixaban.  #Chronic CHF: Stable, euvolemic #Suspected renal cell carcinoma: Incidental finding on CT. Evaluated by oncology. Patient will likely not be a candidate for nephrectomy.   Diet: Dysphagia 3 VTE: Eliquis Code: Full  Dispo: Anticipated discharge to Nursing Home pending insurance authorization. Stable for discharge  Monna Fam, MD PGY-1 Internal Medicine Resident Pager Number [phone] Please contact the on call pager after 5 pm and on weekends at [phone].

## 2023-04-02 DIAGNOSIS — G609 Hereditary and idiopathic neuropathy, unspecified: Secondary | ICD-10-CM | POA: Diagnosis not present

## 2023-04-02 DIAGNOSIS — I5030 Unspecified diastolic (congestive) heart failure: Secondary | ICD-10-CM | POA: Diagnosis not present

## 2023-04-02 DIAGNOSIS — I4891 Unspecified atrial fibrillation: Secondary | ICD-10-CM | POA: Diagnosis not present

## 2023-04-02 DIAGNOSIS — Z9682 Presence of neurostimulator: Secondary | ICD-10-CM | POA: Diagnosis not present

## 2023-04-02 DIAGNOSIS — Z7401 Bed confinement status: Secondary | ICD-10-CM | POA: Diagnosis not present

## 2023-04-02 DIAGNOSIS — E119 Type 2 diabetes mellitus without complications: Secondary | ICD-10-CM | POA: Diagnosis not present

## 2023-04-02 DIAGNOSIS — E871 Hypo-osmolality and hyponatremia: Secondary | ICD-10-CM | POA: Diagnosis not present

## 2023-04-02 DIAGNOSIS — J449 Chronic obstructive pulmonary disease, unspecified: Secondary | ICD-10-CM | POA: Diagnosis not present

## 2023-04-02 DIAGNOSIS — R531 Weakness: Secondary | ICD-10-CM | POA: Diagnosis not present

## 2023-04-02 DIAGNOSIS — R52 Pain, unspecified: Secondary | ICD-10-CM | POA: Diagnosis not present

## 2023-04-02 DIAGNOSIS — R399 Unspecified symptoms and signs involving the genitourinary system: Secondary | ICD-10-CM | POA: Diagnosis not present

## 2023-04-02 DIAGNOSIS — I482 Chronic atrial fibrillation, unspecified: Secondary | ICD-10-CM | POA: Diagnosis not present

## 2023-04-02 DIAGNOSIS — R059 Cough, unspecified: Secondary | ICD-10-CM | POA: Diagnosis not present

## 2023-04-02 DIAGNOSIS — Z7901 Long term (current) use of anticoagulants: Secondary | ICD-10-CM | POA: Diagnosis not present

## 2023-04-02 DIAGNOSIS — M545 Low back pain, unspecified: Secondary | ICD-10-CM | POA: Diagnosis not present

## 2023-04-02 DIAGNOSIS — L8990 Pressure ulcer of unspecified site, unspecified stage: Secondary | ICD-10-CM | POA: Diagnosis not present

## 2023-04-02 DIAGNOSIS — E440 Moderate protein-calorie malnutrition: Secondary | ICD-10-CM | POA: Diagnosis not present

## 2023-04-02 DIAGNOSIS — R1319 Other dysphagia: Secondary | ICD-10-CM | POA: Diagnosis not present

## 2023-04-02 DIAGNOSIS — D638 Anemia in other chronic diseases classified elsewhere: Secondary | ICD-10-CM | POA: Diagnosis not present

## 2023-04-02 DIAGNOSIS — R1314 Dysphagia, pharyngoesophageal phase: Secondary | ICD-10-CM | POA: Diagnosis not present

## 2023-04-02 DIAGNOSIS — M79604 Pain in right leg: Secondary | ICD-10-CM | POA: Diagnosis not present

## 2023-04-02 DIAGNOSIS — Z960 Presence of urogenital implants: Secondary | ICD-10-CM | POA: Diagnosis not present

## 2023-04-02 MED ORDER — LIDOCAINE 5 % EX PTCH: 1.0000 | MEDICATED_PATCH | CUTANEOUS | 0 refills | Status: AC

## 2023-04-02 MED ORDER — ADULT MULTIVITAMIN LIQUID CH: 15.0000 mL | Freq: Every day | ORAL | 0 refills | Status: DC

## 2023-04-02 MED ORDER — NYSTATIN 100000 UNIT/GM EX CREA: 1.0000 | TOPICAL_CREAM | Freq: Two times a day (BID) | CUTANEOUS | 0 refills | Status: AC

## 2023-04-02 NOTE — Discharge Summary (Signed)
Name: Lisa Maxwell MRN: [id number] DOB: 1945/09/16 77 y.o. PCP: Lisa Fusi, MD  Date of Admission: 03/22/2023  1:09 PM Date of Discharge: 04/02/23 Attending Physician: Dr. Mikey Bussing  Discharge Diagnosis: Principal Problem:   Malnutrition of moderate degree Active Problems:   Atrial fibrillation (HCC)   COPD (chronic obstructive pulmonary disease) (HCC)   Decubitus skin ulcer   Other dysphagia   Hyponatremia   Chronic CHF (congestive heart failure) (HCC)   Delirium   Anemia, chronic disease    Discharge Medications: Allergies as of 04/02/2023   No Known Allergies      Medication List     STOP taking these medications    bethanechol 50 MG tablet Commonly known as: URECHOLINE   Co Q-10 200 MG Caps   furosemide 40 MG tablet Commonly known as: LASIX   gabapentin 600 MG tablet Commonly known as: NEURONTIN   Iron (Ferrous Sulfate) 325 (65 Fe) MG Tabs   Klor-Con M20 20 MEQ tablet Generic drug: potassium chloride SA   latanoprost 0.005 % ophthalmic solution Commonly known as: XALATAN   lovastatin 40 MG tablet Commonly known as: MEVACOR Replaced by: pravastatin 40 MG tablet   methocarbamol 500 MG tablet Commonly known as: ROBAXIN   naloxone 4 MG/0.1ML Liqd nasal spray kit Commonly known as: NARCAN   Oxycodone HCl 10 MG Tabs   QC TUMERIC COMPLEX PO   Rybelsus 3 MG Tabs Generic drug: Semaglutide   spironolactone 25 MG tablet Commonly known as: ALDACTONE   tamsulosin 0.4 MG Caps capsule Commonly known as: FLOMAX   Vitamin B-12 500 MCG Subl       TAKE these medications    albuterol 108 (90 Base) MCG/ACT inhaler Commonly known as: VENTOLIN HFA Inhale into the lungs every 6 (six) hours as needed for wheezing or shortness of breath.   Centrum Silver 50+Women Tabs Take 1 tablet by mouth daily with breakfast.   docusate sodium 100 MG capsule Commonly known as: COLACE Take 100 mg by mouth 2 (two) times daily as needed for mild  constipation or moderate constipation.   Eliquis 5 MG Tabs tablet Generic drug: apixaban TAKE 1 TABLET BY MOUTH TWICE A DAY   fluticasone furoate-vilanterol 100-25 MCG/ACT Aepb Commonly known as: BREO ELLIPTA Inhale 1-2 puffs into the lungs daily.   leptospermum manuka honey Pste paste Apply 1 Application topically daily.   lidocaine 5 % Commonly known as: LIDODERM Place 1 patch onto the skin daily. Remove & Discard patch within 12 hours or as directed by MD What changed: Another medication with the same name was added. Make sure you understand how and when to take each.   lidocaine 5 % Commonly known as: LIDODERM Place 1 patch onto the skin daily. Remove & Discard patch within 12 hours or as directed by MD What changed: You were already taking a medication with the same name, and this prescription was added. Make sure you understand how and when to take each.   mirtazapine 15 MG disintegrating tablet Commonly known as: REMERON SOL-TAB Take 1 tablet (15 mg total) by mouth at bedtime.   multivitamin Liqd Take 15 mLs by mouth daily.   nitroGLYCERIN 0.4 MG SL tablet Commonly known as: NITROSTAT Place 0.4 mg under the tongue every 5 (five) minutes as needed for chest pain.   nystatin cream Commonly known as: MYCOSTATIN Apply 1 Application topically 2 (two) times daily. What changed: Another medication with the same name was added. Make sure you understand how and when  to take each.   nystatin cream Commonly known as: MYCOSTATIN Apply 1 Application topically 2 (two) times daily. What changed: You were already taking a medication with the same name, and this prescription was added. Make sure you understand how and when to take each.   pantoprazole 40 MG tablet Commonly known as: PROTONIX Take 1 tablet (40 mg total) by mouth daily. What changed: when to take this   pravastatin 40 MG tablet Commonly known as: PRAVACHOL Take 1 tablet (40 mg total) by mouth daily at 6  PM. Replaces: lovastatin 40 MG tablet   triamcinolone ointment 0.5 % Commonly known as: KENALOG Apply 1 Application topically at bedtime.               Discharge Care Instructions  (From admission, onward)           Start     Ordered   04/02/23 0000  Discharge wound care:       Comments: Interchangeable with TheraHoney Apply Medihoney to left thigh wound and right outer ankle and left anterior foot wounds Q day, then cover with foam dressings.  Change foam dressings Q 3 days or PRN soiling 2. Foam dressings to bilat heels and right thigh, change Q 3 days or PRN soiling   04/02/23 1039            Disposition and follow-up:   Ms.Lisa Maxwell was discharged from [facility] in Stable condition.  At the hospital follow up visit please address:  1.  Follow-up:  - Address malnutrition and how patient has been eating. Consider additional dietary supplementation.    - Possible RCC detected on CT. Patient unlikely to be a candidate for treatment.   2.  Labs / imaging needed at time of follow-up: None  3.  Pending labs/ test needing follow-up: None  4.  Medication Changes  Stopped: Bethanechol, furosemide, gabapentin, Kcl, lovastatin, Robaxin, naloxone, oxycodone, Rybelsus, spironolactone, tamsulosin  Follow-up Appointments:  Contact information for follow-up providers     Lisa Fusi, MD.   Specialty: Internal Medicine Contact information: [address] [place] [address] [phone]              Contact information for after-discharge care     Destination     [facility] .   Service: Skilled Nursing Contact information: [address] [place] [address] [phone]                     Hospital Course by problem list:  #Aspiration pneumonia, resolved #Hx esophageal stricture s/p dilation #Schatzki ring, s/p dilation Patient presented  with generalized weakness and poor oral intake in the setting of known esophageal strictures. She had had worsening of chronic esophageal dysphagia for the past 3 weeks. Presented afebrile and with a leukocytosis to 12.2. CXR at admission showed ovoid right upper lung opacity possibly representing acute pulmonary infiltrate, confirmed by CT chest showing multifocal ground-glass and consolidative infiltrates scattered throughout the lungs bilaterally. Patient's overall clinical picture was concerning for aspiration pneumonia. She was started on Zosyn, then transitioned to and completed a course of Unasyn. GI was consulted who found a non-obstructing Schatzki ring at the gastroesophageal junction; dilation was performed. Patient was able transition to full liquid diet then to dysphagia 3 diet without further concern for aspiration. Patient's chronic malnutrition and failure to thrive make her overall prognosis poor. Palliative care was consulted during admission who completed  a MOST form and patient's code status was changed to DNR. Patient was discharged to skilled nursing facility for long-term care.    #Hyponatremia Patient presented with a sodium of 125 in the setting of poor po intake and chronic malnutrition. Sodium was trended with daily labs, which improved as her esophageal strictures were dilated and aspiration pneumonia was treated. Hyponatremia resolved with resumption of normal diet.    #Delirium Patient developed hospital delirium with waxing and waning mental status throughout admission. Delirium precautions were put in place and centrally acting medications were held.    #Atrial fibrillation Patient was in atrial fibrillation on presentation with tachycardia into the 110s-120s, possibly worsened in the setting of acute illness and dehydration. Home Eliquis 5mg  BID continued throughout admission.    #COPD Patient's home Breo Ellipta and albuterol were continued during admission.    #HFpEF  (EF 55-60%) Home spironolactone was held, home Lasix continued. Patient was euvolemic and not needing accessory oxygen on day of discharge.    #Right kidney mass, suspected primary RCC CT Abdomen showed a lobulated enhancing mass seen within the right kidney measuring at least 2.1 x 2.8x 4.1 cm compatible with a primary renal cell carcinoma with no evidence of regional nodal or distant metastatic disease. Patient was seen by oncology who recommended further evaluation by urology. Further workup deferred to outpatient setting given acute illness.    #T2DM Patient's home Rybelsus was held during admission.    #CAD Home statin was continued during admission   #GERD Home PPI continued during admission    Discharge Subjective: Patient feeling similar to previous, no new complaints today. Spoke with husband extensively about options for long-term care facility and hospice. He will continue to speak with social work and palliative about the best option for her declining health and poor prognosis.   Discharge Exam:   BP (!) 115/91 (BP Location: Right Arm)   Pulse 80   Temp 97.8 F (36.6 C) (Oral)   Resp 16   Ht 4\' 8"  (1.422 m)   Wt 48.9 kg   SpO2 97%   BMI 24.17 kg/m  Constitutional: chronically ill appearing, poorly oriented, slow to speak Cardiovascular: RRR Pulmonary/Chest: normal work of breathing on room air, lungs clear to auscultation bilaterally Abdominal: soft, non-tender, non-distended  Pertinent Labs, Studies, and Procedures:     Latest Ref Rng & Units 03/30/2023    2:51 AM 03/27/2023    2:29 AM 03/26/2023    2:59 AM  CBC  WBC 4.0 - 10.5 K/uL 6.9  6.7  6.2   Hemoglobin 12.0 - 15.0 g/dL 49.7  8.5  8.4   Hematocrit 36.0 - 46.0 % 33.4  26.3  25.6   Platelets 150 - 400 K/uL 267  308  312        Latest Ref Rng & Units 03/30/2023    2:51 AM 03/27/2023    2:29 AM 03/26/2023    2:59 AM  CMP  Glucose 70 - 99 mg/dL 026  378  588   BUN 8 - 23 mg/dL 12  13  11     Creatinine 0.44 - 1.00 mg/dL 5.02  7.74  1.28   Sodium 135 - 145 mmol/L 135  133  133   Potassium 3.5 - 5.1 mmol/L 4.2  3.7  3.5   Chloride 98 - 111 mmol/L 107  103  103   CO2 22 - 32 mmol/L 19  23  23    Calcium 8.9 - 10.3 mg/dL 78.6  9.7  9.3     CT HEAD WO CONTRAST ( ) Result Date: 03/24/2023 CLINICAL DATA:  Dysphagia EXAM: CT HEAD WITHOUT CONTRAST TECHNIQUE: Contiguous axial images were obtained from the base of the skull through the vertex without intravenous contrast. RADIATION DOSE REDUCTION: This exam was performed according to the departmental dose-optimization program which includes automated exposure control, adjustment of the mA and/or kV according to patient size and/or use of iterative reconstruction technique. COMPARISON:  None Available. FINDINGS: Brain: No mass, hemorrhage or extra-axial collection. There is generalized volume loss. There is hypoattenuation of the bilateral supratentorial white matter. Old left occipital infarct. Vascular: There is atherosclerotic calcification of both internal carotid arteries at the skull base. Skull: Normal. Negative for fracture or focal lesion. Sinuses/Orbits: No acute finding. Other: None. IMPRESSION: 1. No acute intracranial abnormality. 2. Old left occipital infarct and findings of chronic small vessel ischemia. Electronically Signed   By: Deatra Robinson M.D.   On: 03/24/2023 02:30   DG Chest Port 1 View Result Date: 03/22/2023 CLINICAL DATA:  Weakness EXAM: PORTABLE CHEST 1 VIEW COMPARISON:  12/05/2022 FINDINGS: Ascending thoracic stimulator leads with similar positioning. Cardiac enlargement with mild central congestion. No pleural effusion. No pneumothorax. Ovoid right upper lung opacity. IMPRESSION: 1. Cardiac enlargement with mild central congestion. 2. Ovoid right upper lung opacity, this could represent acute pulmonary infiltrate with lung nodule not excluded. Short interval radiographic follow-up is recommended. Electronically Signed    By: Jasmine Pang M.D.   On: 03/22/2023 16:26   Signed: Monna Fam, MD PGY-1 04/02/2023, 10:47 AM   Pager: [phone]

## 2023-04-02 NOTE — TOC Transition Note (Signed)
Transition of Care [facility]) - Discharge Note   Patient Details  Name: Lisa Maxwell MRN: [id number] Date of Birth: [birth year]  Transition of Care [facility]) CM/SW Contact:  Michaela Corner, LCSWA Phone Number: 04/02/2023, 11:57 AM   Clinical Narrative:   Patient will DC to: [facility] Anticipated DC date: 04/02/2023 Family notified: Kendell Bane (spouse) Transport by: Sharin Mons   Per MD patient ready for DC to [facility]. RN to call report prior to discharge [phone] (ask for 200 hall nurse); room 213). RN, patient, patient's family, and facility notified of DC. Discharge Summary and FL2 sent to facility. DC packet on chart. Ambulance transport requested for patient.   CSW will sign off for now as social work intervention is no longer needed. Please consult [place] again if new needs arise.      Final next level of care: Skilled Nursing Facility Barriers to Discharge: Barriers Resolved   Patient Goals and CMS Choice Patient states their goals for this hospitalization and ongoing recovery are:: To go to [facility]          Discharge Placement              Patient chooses bed at: Other - please specify in the comment section below: ([facility]) Patient to be transferred to facility by: [facility]tar Name of family member notified: Kendell Bane (spouse) Patient and family notified of of transfer: 04/02/23  Discharge Plan and Services Additional resources added to the After Visit Summary for   In-house Referral: Clinical Social Work                                   Social Drivers of Health (SDOH) Interventions SDOH Screenings   Food Insecurity: No Food Insecurity (03/22/2023)  Housing: Low Risk  (03/22/2023)  Transportation Needs: No Transportation Needs (03/22/2023)  Utilities: Not At Risk (03/22/2023)  Alcohol Screen: Low Risk  (07/31/2022)  Depression (PHQ2-9): Low Risk  (07/31/2022)  Financial Resource Strain: Low Risk  (07/31/2022)  Physical Activity: Unknown  (07/31/2022)  Tobacco Use: High Risk (03/24/2023)     Readmission Risk Interventions     No data to display

## 2023-04-02 NOTE — Progress Notes (Addendum)
Speech Language Pathology Treatment: Dysphagia  Patient Details Name: Lisa Maxwell MRN: [id number] DOB: 15-Dec-1945 Today's Date: 04/02/2023 Time: 7564-3329 SLP Time Calculation (min) (ACUTE ONLY): 22 min  Assessment / Plan / Recommendation Clinical Impression  SLP follow up regarding pt's dysphagia including reviewing compensation strategies  and care plan.  Spouse reports pt with occasional cough while eating since diet advancement but he states he does not suspect it is due to anything going the wrong way.   Pt refuses to drink the thicker liquids - which will elevate concerns for dehydration. She does have a protein shake that is slightly thicker and thus advised she drink her shake with her meals OR use tsps of thin liquids. Implemented [employer] protocol to allow pt to have thin water via cup between meals to help with hydration and QOL.   Reviewed with spouse and pt that GI MD documented possible component of dysmotility contributing to dysphagia symptoms as well, thus likely multifactorial issues. SLP observed pt with consumption of soft banana, protein shake and tsps of thin coffee. Swallow clinically judged to be minimally delayed with reflexive throat clear x1 of 7 tsps of coffee and subelt cough x1 of 4 boluses of banana.   Using teach back and visual feedback to observe laryngeal elevation, spouse educated to assure pt swallows before she takes more. Spouse reports he fed pt today - thus SLP encouraged pt to feed herself for neurological input/proprioception to help with nutrition and swallow efficiency.   Pt with sub-optimal positioning as she slides down in bed rapidly after being slid up - due to her discomfort.    Continue strict aspiration precautions - encouraging THIN WATER between meals for hydration - no other intake with thin WATER.   Mod cues overall for comprehension and reasoning of care plan with spouse and max with pt.   Secure chatted RN and placed order. Will  continue efforts.     HPI HPI: Pt is a 77 yo female presenting with generalized weakness and poor oral intake. Per MD H&P, husband reports 2-3 weeks of trouble swallowing, described as food feeling stuck in her throat (consistent with previous stricture), regurgitation with inability to keep food down, and coughing "sometimes" with thin liquids. Pt had swallow studies at [facility] the week PTA . Full report not available but per husband, she could swallow limited amounts on MBS fine, but after the applesauce she started to regurgitate. He said the SLP described it as a "clogged drain" through which limited amounts were passing, but this was not seen on esopahgram later that week. Pt was previously seen by SLP at [facility] in March 2024 with clinical presentation improved after esophageal dilation; no s/s of aspiration. MRI 12/19 negative for acute findings. PMH includes: esophageal stricture s/p dilation procedures, prior saddle embolus, afib, HFpEF, DMII, CAD, chronic low back pain s/p spinal cord stimulator  Pt is s/p MBS here that showed silent aspiration of thin and nectar liquids.      SLP Plan  Continue with current plan of care      Recommendations for follow up therapy are one component of a multi-disciplinary discharge planning process, led by the attending physician.  Recommendations may be updated based on patient status, additional functional criteria and insurance authorization.    Recommendations  Diet recommendations: Dysphagia 3 (mechanical soft);Nectar-thick liquid (tsps of thin liquids ok anytime, thin water ok between meals via cup/tsp - no straws) Liquids provided via: Cup;No straw;Teaspoon Medication Administration: Other (Comment) (with  puree) Supervision: Full supervision/cueing for compensatory strategies;Trained caregiver to feed patient Compensations: Slow rate;Small sips/bites;Other (Comment) (start intake with liquids - nectar via cup ok or tsps thin) Postural  Changes and/or Swallow Maneuvers: Seated upright 90 degrees;Upright 30-60 min after meal                  Oral care BID Dentures out and cleaned nightly, Brush gums/tongue daily - am and pm please.      Dysphagia, oropharyngeal phase (R13.12)     Continue with current plan of care    Rolena Infante, MS [facility] SLP Acute Rehab Services Office [phone]  Chales Abrahams  04/02/2023, 9:30 AM

## 2023-04-02 NOTE — Plan of Care (Signed)
  Problem: Education: Goal: Knowledge of General Education information will improve Description Including pain rating scale, medication(s)/side effects and non-pharmacologic comfort measures Outcome: Progressing   Problem: Health Behavior/Discharge Planning: Goal: Ability to manage health-related needs will improve Outcome: Progressing   

## 2023-04-02 NOTE — Progress Notes (Signed)
Report called to facility, spoke with [facility]. All questions and concerns answered at this time.

## 2023-04-02 NOTE — Progress Notes (Signed)
Physical Therapy Treatment Patient Details Name: Lisa Maxwell MRN: [id number] DOB: [birth year] Today's Date: 04/02/2023   History of Present Illness Pt is a 77 yo female presents 12/16 with generalized weakness and poor oral intake and is admitted for concerns of aspiration pneumonia and hypovolemic hyponatremia secondary to esophageal dysphagia.Found to have aspiration PNA, hyponatremia, in Afib, dehydration and GI bleed  PMH of esophageal strictures s/p dilation procedures, prior saddle embolus (11/2021), atrial fibrillation on Eliquis, HFpEF, type 2 diabetes, CAD, and chronic low back pain s/p spinal cord stimulation insertion AD, cerebrovascular disease, a-fib, COPD, chronic pain syndrome, renal failure, DDD, HTN, bil TKA, PVD, spinal stenosis lumbar spine with neurogenic claudication, cauda equina compression CAD CKD    PT Comments  Patient resting in bed and scooted low in bed, max+2 to boost superior in bed and reposition. Pt agreeable to bed level mobility. Bil LE A/AAROM completed with light manual resistance provided to facilitate hip strengthening. Pt most limited by dorsiflexion weakness and assist provided for increased ROM. Pt repositioned with pillows for comfort in supine. Continue to recommend continued inpatient follow up therapy, <3 hours/day. Will progress as able.    If plan is discharge home, recommend the following: A lot of help with walking and/or transfers;A lot of help with bathing/dressing/bathroom;Assistance with cooking/housework;Assistance with feeding;Direct supervision/assist for medications management;Direct supervision/assist for financial management;Assist for transportation;Help with stairs or ramp for entrance   Can travel by private vehicle     No  Equipment Recommendations  None recommended by PT    Recommendations for Other Services OT consult     Precautions / Restrictions Precautions Precautions: Fall Restrictions Weight Bearing Restrictions Per  Provider Order: No     Mobility  Bed Mobility Overal bed mobility: Needs Assistance Bed Mobility: Supine to Sit, Sit to Supine, Rolling Rolling: Max assist         General bed mobility comments: Max assist for rolling and +2 for superior boost.    Transfers                        Ambulation/Gait                   Stairs             Wheelchair Mobility     Tilt Bed    Modified Rankin (Stroke Patients Only)       Balance Overall balance assessment: Needs assistance Sitting-balance support: Feet supported, No upper extremity supported, Bilateral upper extremity supported, Single extremity supported Sitting balance-Leahy Scale: Fair Sitting balance - Comments: flexed/kyphotic posture                                    Cognition Arousal: Alert Behavior During Therapy: WFL for tasks assessed/performed Overall Cognitive Status: Within Functional Limits for tasks assessed                                          Exercises General Exercises - Lower Extremity Ankle Circles/Pumps: AAROM, Both, 10 reps, Supine Quad Sets: AROM, Both, 20 reps, Supine, Limitations Quad Sets Limitations: 2x10 Hip ABduction/ADduction: AROM, Both, 20 reps, Supine, Limitations Hip Abduction/Adduction Limitations: 2x10 Hip Flexion/Marching: AROM, Both, 20 reps, Supine, Limitations Hip Flexion/Marching Limitations: 2x10; light resistance with hip extension    General  Comments        Pertinent Vitals/Pain Pain Assessment Pain Assessment: Faces Faces Pain Scale: Hurts little more Pain Location: back wiht bed mobility Pain Descriptors / Indicators: Discomfort Pain Intervention(s): Limited activity within patient's tolerance, Monitored during session, Repositioned    Home Living                          Prior Function            PT Goals (current goals can now be found in the care plan section) Acute Rehab PT  Goals PT Goal Formulation: With patient/family Time For Goal Achievement: 04/06/23 Potential to Achieve Goals: Fair Progress towards PT goals: Progressing toward goals    Frequency    Min 1X/week      PT Plan      Co-evaluation              AM-PAC PT "6 Clicks" Mobility   Outcome Measure  Help needed turning from your back to your side while in a flat bed without using bedrails?: Total Help needed moving from lying on your back to sitting on the side of a flat bed without using bedrails?: Total Help needed moving to and from a bed to a chair (including a wheelchair)?: Total Help needed standing up from a chair using your arms (e.g., wheelchair or bedside chair)?: Total Help needed to walk in hospital room?: Total Help needed climbing 3-5 steps with a railing? : Total 6 Click Score: 6    End of Session Equipment Utilized During Treatment: Gait belt Activity Tolerance: Patient tolerated treatment well Patient left: in bed;with call bell/phone within reach;with bed alarm set;with family/visitor present Nurse Communication: Mobility status PT Visit Diagnosis: Muscle weakness (generalized) (M62.81);Difficulty in walking, not elsewhere classified (R26.2);Pain Pain - Right/Left:  (bil) Pain - part of body: Leg (back)     Time: 1027-2536 PT Time Calculation (min) (ACUTE ONLY): 19 min  Charges:    $Therapeutic Exercise: 8-22 mins PT General Charges $$ ACUTE PT VISIT: 1 Visit                     Wynn Maudlin, DPT Acute Rehabilitation Services Office [phone]  04/02/23 12:42 PM

## 2023-04-02 NOTE — TOC Progression Note (Signed)
Transition of Care [facility]) - Progression Note    Patient Details  Name: REIN REINING MRN: [id number] Date of Birth: 04-02-1946  Transition of Care [facility]) CM/SW Contact  Michaela Corner, [place] Phone Number: 04/02/2023, 9:25 AM  Clinical Narrative:   Insurance Berkley Harvey appeal overturned. Ins auth approved for  04/01/2023-04/05/2023. Berkley Harvey ID:  [id number]  9:29AM: CSW spoke with Leotis Shames at [facility] and provided them with ins auth approval information. Lauren states pt can dc to them today if medically stable.     Expected Discharge Plan: Skilled Nursing Facility Barriers to Discharge: Continued Medical Work up, SNF Pending bed offer  Expected Discharge Plan and Services In-house Referral: Clinical Social Work                                             Social Determinants of Health (SDOH) Interventions SDOH Screenings   Food Insecurity: No Food Insecurity (03/22/2023)  Housing: Low Risk  (03/22/2023)  Transportation Needs: No Transportation Needs (03/22/2023)  Utilities: Not At Risk (03/22/2023)  Alcohol Screen: Low Risk  (07/31/2022)  Depression (PHQ2-9): Low Risk  (07/31/2022)  Financial Resource Strain: Low Risk  (07/31/2022)  Physical Activity: Unknown (07/31/2022)  Tobacco Use: High Risk (03/24/2023)    Readmission Risk Interventions     No data to display

## 2023-04-06 DIAGNOSIS — E440 Moderate protein-calorie malnutrition: Secondary | ICD-10-CM | POA: Diagnosis not present

## 2023-04-06 DIAGNOSIS — I482 Chronic atrial fibrillation, unspecified: Secondary | ICD-10-CM | POA: Diagnosis not present

## 2023-04-06 DIAGNOSIS — I5030 Unspecified diastolic (congestive) heart failure: Secondary | ICD-10-CM | POA: Diagnosis not present

## 2023-04-06 DIAGNOSIS — Z7901 Long term (current) use of anticoagulants: Secondary | ICD-10-CM | POA: Diagnosis not present

## 2023-04-09 DIAGNOSIS — M545 Low back pain, unspecified: Secondary | ICD-10-CM | POA: Diagnosis not present

## 2023-04-09 DIAGNOSIS — R52 Pain, unspecified: Secondary | ICD-10-CM | POA: Diagnosis not present

## 2023-04-09 DIAGNOSIS — E119 Type 2 diabetes mellitus without complications: Secondary | ICD-10-CM | POA: Diagnosis not present

## 2023-04-12 DIAGNOSIS — L8990 Pressure ulcer of unspecified site, unspecified stage: Secondary | ICD-10-CM | POA: Diagnosis not present

## 2023-04-12 DIAGNOSIS — I4891 Unspecified atrial fibrillation: Secondary | ICD-10-CM | POA: Diagnosis not present

## 2023-04-12 DIAGNOSIS — E440 Moderate protein-calorie malnutrition: Secondary | ICD-10-CM | POA: Diagnosis not present

## 2023-04-12 DIAGNOSIS — J449 Chronic obstructive pulmonary disease, unspecified: Secondary | ICD-10-CM | POA: Diagnosis not present

## 2023-04-21 DIAGNOSIS — R52 Pain, unspecified: Secondary | ICD-10-CM | POA: Diagnosis not present

## 2023-04-21 DIAGNOSIS — M79604 Pain in right leg: Secondary | ICD-10-CM | POA: Diagnosis not present

## 2023-04-30 DIAGNOSIS — M545 Low back pain, unspecified: Secondary | ICD-10-CM | POA: Diagnosis not present

## 2023-04-30 DIAGNOSIS — I482 Chronic atrial fibrillation, unspecified: Secondary | ICD-10-CM | POA: Diagnosis not present

## 2023-04-30 DIAGNOSIS — E119 Type 2 diabetes mellitus without complications: Secondary | ICD-10-CM | POA: Diagnosis not present

## 2023-04-30 DIAGNOSIS — I5030 Unspecified diastolic (congestive) heart failure: Secondary | ICD-10-CM | POA: Diagnosis not present

## 2023-05-03 DIAGNOSIS — I5032 Chronic diastolic (congestive) heart failure: Secondary | ICD-10-CM | POA: Diagnosis not present

## 2023-05-03 DIAGNOSIS — N1832 Chronic kidney disease, stage 3b: Secondary | ICD-10-CM | POA: Diagnosis not present

## 2023-05-03 DIAGNOSIS — I130 Hypertensive heart and chronic kidney disease with heart failure and stage 1 through stage 4 chronic kidney disease, or unspecified chronic kidney disease: Secondary | ICD-10-CM | POA: Diagnosis not present

## 2023-05-03 DIAGNOSIS — J449 Chronic obstructive pulmonary disease, unspecified: Secondary | ICD-10-CM | POA: Diagnosis not present

## 2023-05-03 DIAGNOSIS — E1140 Type 2 diabetes mellitus with diabetic neuropathy, unspecified: Secondary | ICD-10-CM | POA: Diagnosis not present

## 2023-05-03 DIAGNOSIS — L89890 Pressure ulcer of other site, unstageable: Secondary | ICD-10-CM | POA: Diagnosis not present

## 2023-05-03 DIAGNOSIS — I480 Paroxysmal atrial fibrillation: Secondary | ICD-10-CM | POA: Diagnosis not present

## 2023-05-03 DIAGNOSIS — E1122 Type 2 diabetes mellitus with diabetic chronic kidney disease: Secondary | ICD-10-CM | POA: Diagnosis not present

## 2023-05-03 DIAGNOSIS — E1151 Type 2 diabetes mellitus with diabetic peripheral angiopathy without gangrene: Secondary | ICD-10-CM | POA: Diagnosis not present

## 2023-05-05 DIAGNOSIS — N1832 Chronic kidney disease, stage 3b: Secondary | ICD-10-CM | POA: Diagnosis not present

## 2023-05-05 DIAGNOSIS — I5032 Chronic diastolic (congestive) heart failure: Secondary | ICD-10-CM | POA: Diagnosis not present

## 2023-05-05 DIAGNOSIS — I130 Hypertensive heart and chronic kidney disease with heart failure and stage 1 through stage 4 chronic kidney disease, or unspecified chronic kidney disease: Secondary | ICD-10-CM | POA: Diagnosis not present

## 2023-05-05 DIAGNOSIS — L89890 Pressure ulcer of other site, unstageable: Secondary | ICD-10-CM | POA: Diagnosis not present

## 2023-05-05 DIAGNOSIS — J449 Chronic obstructive pulmonary disease, unspecified: Secondary | ICD-10-CM | POA: Diagnosis not present

## 2023-05-05 DIAGNOSIS — E1140 Type 2 diabetes mellitus with diabetic neuropathy, unspecified: Secondary | ICD-10-CM | POA: Diagnosis not present

## 2023-05-05 DIAGNOSIS — I480 Paroxysmal atrial fibrillation: Secondary | ICD-10-CM | POA: Diagnosis not present

## 2023-05-05 DIAGNOSIS — E1122 Type 2 diabetes mellitus with diabetic chronic kidney disease: Secondary | ICD-10-CM | POA: Diagnosis not present

## 2023-05-05 DIAGNOSIS — E1151 Type 2 diabetes mellitus with diabetic peripheral angiopathy without gangrene: Secondary | ICD-10-CM | POA: Diagnosis not present

## 2023-05-06 DIAGNOSIS — J449 Chronic obstructive pulmonary disease, unspecified: Secondary | ICD-10-CM | POA: Diagnosis not present

## 2023-05-06 DIAGNOSIS — R410 Disorientation, unspecified: Secondary | ICD-10-CM | POA: Diagnosis not present

## 2023-05-06 DIAGNOSIS — E871 Hypo-osmolality and hyponatremia: Secondary | ICD-10-CM | POA: Diagnosis not present

## 2023-05-06 DIAGNOSIS — R1310 Dysphagia, unspecified: Secondary | ICD-10-CM | POA: Diagnosis not present

## 2023-05-06 DIAGNOSIS — D638 Anemia in other chronic diseases classified elsewhere: Secondary | ICD-10-CM | POA: Diagnosis not present

## 2023-05-06 DIAGNOSIS — N2889 Other specified disorders of kidney and ureter: Secondary | ICD-10-CM | POA: Diagnosis not present

## 2023-05-06 DIAGNOSIS — E440 Moderate protein-calorie malnutrition: Secondary | ICD-10-CM | POA: Diagnosis not present

## 2023-05-06 DIAGNOSIS — L8990 Pressure ulcer of unspecified site, unspecified stage: Secondary | ICD-10-CM | POA: Diagnosis not present

## 2023-05-06 DIAGNOSIS — I480 Paroxysmal atrial fibrillation: Secondary | ICD-10-CM | POA: Diagnosis not present

## 2023-05-10 DIAGNOSIS — I130 Hypertensive heart and chronic kidney disease with heart failure and stage 1 through stage 4 chronic kidney disease, or unspecified chronic kidney disease: Secondary | ICD-10-CM | POA: Diagnosis not present

## 2023-05-10 DIAGNOSIS — E1122 Type 2 diabetes mellitus with diabetic chronic kidney disease: Secondary | ICD-10-CM | POA: Diagnosis not present

## 2023-05-10 DIAGNOSIS — I5032 Chronic diastolic (congestive) heart failure: Secondary | ICD-10-CM | POA: Diagnosis not present

## 2023-05-10 DIAGNOSIS — N1832 Chronic kidney disease, stage 3b: Secondary | ICD-10-CM | POA: Diagnosis not present

## 2023-05-10 DIAGNOSIS — E1140 Type 2 diabetes mellitus with diabetic neuropathy, unspecified: Secondary | ICD-10-CM | POA: Diagnosis not present

## 2023-05-10 DIAGNOSIS — E1151 Type 2 diabetes mellitus with diabetic peripheral angiopathy without gangrene: Secondary | ICD-10-CM | POA: Diagnosis not present

## 2023-05-10 DIAGNOSIS — J449 Chronic obstructive pulmonary disease, unspecified: Secondary | ICD-10-CM | POA: Diagnosis not present

## 2023-05-10 DIAGNOSIS — I480 Paroxysmal atrial fibrillation: Secondary | ICD-10-CM | POA: Diagnosis not present

## 2023-05-10 DIAGNOSIS — L89890 Pressure ulcer of other site, unstageable: Secondary | ICD-10-CM | POA: Diagnosis not present

## 2023-05-12 DIAGNOSIS — I5032 Chronic diastolic (congestive) heart failure: Secondary | ICD-10-CM | POA: Diagnosis not present

## 2023-05-12 DIAGNOSIS — I480 Paroxysmal atrial fibrillation: Secondary | ICD-10-CM | POA: Diagnosis not present

## 2023-05-12 DIAGNOSIS — E1151 Type 2 diabetes mellitus with diabetic peripheral angiopathy without gangrene: Secondary | ICD-10-CM | POA: Diagnosis not present

## 2023-05-12 DIAGNOSIS — I130 Hypertensive heart and chronic kidney disease with heart failure and stage 1 through stage 4 chronic kidney disease, or unspecified chronic kidney disease: Secondary | ICD-10-CM | POA: Diagnosis not present

## 2023-05-12 DIAGNOSIS — L89890 Pressure ulcer of other site, unstageable: Secondary | ICD-10-CM | POA: Diagnosis not present

## 2023-05-12 DIAGNOSIS — J449 Chronic obstructive pulmonary disease, unspecified: Secondary | ICD-10-CM | POA: Diagnosis not present

## 2023-05-12 DIAGNOSIS — E1140 Type 2 diabetes mellitus with diabetic neuropathy, unspecified: Secondary | ICD-10-CM | POA: Diagnosis not present

## 2023-05-12 DIAGNOSIS — N1832 Chronic kidney disease, stage 3b: Secondary | ICD-10-CM | POA: Diagnosis not present

## 2023-05-12 DIAGNOSIS — E1122 Type 2 diabetes mellitus with diabetic chronic kidney disease: Secondary | ICD-10-CM | POA: Diagnosis not present

## 2023-05-13 DIAGNOSIS — I5032 Chronic diastolic (congestive) heart failure: Secondary | ICD-10-CM | POA: Diagnosis not present

## 2023-05-13 DIAGNOSIS — L89890 Pressure ulcer of other site, unstageable: Secondary | ICD-10-CM | POA: Diagnosis not present

## 2023-05-13 DIAGNOSIS — I130 Hypertensive heart and chronic kidney disease with heart failure and stage 1 through stage 4 chronic kidney disease, or unspecified chronic kidney disease: Secondary | ICD-10-CM | POA: Diagnosis not present

## 2023-05-13 DIAGNOSIS — E1140 Type 2 diabetes mellitus with diabetic neuropathy, unspecified: Secondary | ICD-10-CM | POA: Diagnosis not present

## 2023-05-13 DIAGNOSIS — E1122 Type 2 diabetes mellitus with diabetic chronic kidney disease: Secondary | ICD-10-CM | POA: Diagnosis not present

## 2023-05-13 DIAGNOSIS — E1151 Type 2 diabetes mellitus with diabetic peripheral angiopathy without gangrene: Secondary | ICD-10-CM | POA: Diagnosis not present

## 2023-05-13 DIAGNOSIS — I480 Paroxysmal atrial fibrillation: Secondary | ICD-10-CM | POA: Diagnosis not present

## 2023-05-13 DIAGNOSIS — N1832 Chronic kidney disease, stage 3b: Secondary | ICD-10-CM | POA: Diagnosis not present

## 2023-05-13 DIAGNOSIS — J449 Chronic obstructive pulmonary disease, unspecified: Secondary | ICD-10-CM | POA: Diagnosis not present

## 2023-05-14 DIAGNOSIS — E1140 Type 2 diabetes mellitus with diabetic neuropathy, unspecified: Secondary | ICD-10-CM | POA: Diagnosis not present

## 2023-05-14 DIAGNOSIS — I480 Paroxysmal atrial fibrillation: Secondary | ICD-10-CM | POA: Diagnosis not present

## 2023-05-14 DIAGNOSIS — N1832 Chronic kidney disease, stage 3b: Secondary | ICD-10-CM | POA: Diagnosis not present

## 2023-05-14 DIAGNOSIS — I5032 Chronic diastolic (congestive) heart failure: Secondary | ICD-10-CM | POA: Diagnosis not present

## 2023-05-14 DIAGNOSIS — J449 Chronic obstructive pulmonary disease, unspecified: Secondary | ICD-10-CM | POA: Diagnosis not present

## 2023-05-14 DIAGNOSIS — I130 Hypertensive heart and chronic kidney disease with heart failure and stage 1 through stage 4 chronic kidney disease, or unspecified chronic kidney disease: Secondary | ICD-10-CM | POA: Diagnosis not present

## 2023-05-14 DIAGNOSIS — E1122 Type 2 diabetes mellitus with diabetic chronic kidney disease: Secondary | ICD-10-CM | POA: Diagnosis not present

## 2023-05-14 DIAGNOSIS — E1151 Type 2 diabetes mellitus with diabetic peripheral angiopathy without gangrene: Secondary | ICD-10-CM | POA: Diagnosis not present

## 2023-05-14 DIAGNOSIS — L89890 Pressure ulcer of other site, unstageable: Secondary | ICD-10-CM | POA: Diagnosis not present

## 2023-05-17 DIAGNOSIS — J449 Chronic obstructive pulmonary disease, unspecified: Secondary | ICD-10-CM | POA: Diagnosis not present

## 2023-05-17 DIAGNOSIS — I480 Paroxysmal atrial fibrillation: Secondary | ICD-10-CM | POA: Diagnosis not present

## 2023-05-17 DIAGNOSIS — N1832 Chronic kidney disease, stage 3b: Secondary | ICD-10-CM | POA: Diagnosis not present

## 2023-05-17 DIAGNOSIS — I130 Hypertensive heart and chronic kidney disease with heart failure and stage 1 through stage 4 chronic kidney disease, or unspecified chronic kidney disease: Secondary | ICD-10-CM | POA: Diagnosis not present

## 2023-05-17 DIAGNOSIS — E1122 Type 2 diabetes mellitus with diabetic chronic kidney disease: Secondary | ICD-10-CM | POA: Diagnosis not present

## 2023-05-17 DIAGNOSIS — E1140 Type 2 diabetes mellitus with diabetic neuropathy, unspecified: Secondary | ICD-10-CM | POA: Diagnosis not present

## 2023-05-17 DIAGNOSIS — E1151 Type 2 diabetes mellitus with diabetic peripheral angiopathy without gangrene: Secondary | ICD-10-CM | POA: Diagnosis not present

## 2023-05-17 DIAGNOSIS — I5032 Chronic diastolic (congestive) heart failure: Secondary | ICD-10-CM | POA: Diagnosis not present

## 2023-05-17 DIAGNOSIS — L89890 Pressure ulcer of other site, unstageable: Secondary | ICD-10-CM | POA: Diagnosis not present

## 2023-05-18 DIAGNOSIS — L89890 Pressure ulcer of other site, unstageable: Secondary | ICD-10-CM | POA: Diagnosis not present

## 2023-05-18 DIAGNOSIS — E1140 Type 2 diabetes mellitus with diabetic neuropathy, unspecified: Secondary | ICD-10-CM | POA: Diagnosis not present

## 2023-05-18 DIAGNOSIS — I480 Paroxysmal atrial fibrillation: Secondary | ICD-10-CM | POA: Diagnosis not present

## 2023-05-18 DIAGNOSIS — E1151 Type 2 diabetes mellitus with diabetic peripheral angiopathy without gangrene: Secondary | ICD-10-CM | POA: Diagnosis not present

## 2023-05-18 DIAGNOSIS — I130 Hypertensive heart and chronic kidney disease with heart failure and stage 1 through stage 4 chronic kidney disease, or unspecified chronic kidney disease: Secondary | ICD-10-CM | POA: Diagnosis not present

## 2023-05-18 DIAGNOSIS — J449 Chronic obstructive pulmonary disease, unspecified: Secondary | ICD-10-CM | POA: Diagnosis not present

## 2023-05-18 DIAGNOSIS — N1832 Chronic kidney disease, stage 3b: Secondary | ICD-10-CM | POA: Diagnosis not present

## 2023-05-18 DIAGNOSIS — I5032 Chronic diastolic (congestive) heart failure: Secondary | ICD-10-CM | POA: Diagnosis not present

## 2023-05-18 DIAGNOSIS — E1122 Type 2 diabetes mellitus with diabetic chronic kidney disease: Secondary | ICD-10-CM | POA: Diagnosis not present

## 2023-05-19 DIAGNOSIS — L89890 Pressure ulcer of other site, unstageable: Secondary | ICD-10-CM | POA: Diagnosis not present

## 2023-05-19 DIAGNOSIS — I5032 Chronic diastolic (congestive) heart failure: Secondary | ICD-10-CM | POA: Diagnosis not present

## 2023-05-19 DIAGNOSIS — I130 Hypertensive heart and chronic kidney disease with heart failure and stage 1 through stage 4 chronic kidney disease, or unspecified chronic kidney disease: Secondary | ICD-10-CM | POA: Diagnosis not present

## 2023-05-19 DIAGNOSIS — E1140 Type 2 diabetes mellitus with diabetic neuropathy, unspecified: Secondary | ICD-10-CM | POA: Diagnosis not present

## 2023-05-19 DIAGNOSIS — N1832 Chronic kidney disease, stage 3b: Secondary | ICD-10-CM | POA: Diagnosis not present

## 2023-05-19 DIAGNOSIS — J449 Chronic obstructive pulmonary disease, unspecified: Secondary | ICD-10-CM | POA: Diagnosis not present

## 2023-05-19 DIAGNOSIS — E1151 Type 2 diabetes mellitus with diabetic peripheral angiopathy without gangrene: Secondary | ICD-10-CM | POA: Diagnosis not present

## 2023-05-19 DIAGNOSIS — E1122 Type 2 diabetes mellitus with diabetic chronic kidney disease: Secondary | ICD-10-CM | POA: Diagnosis not present

## 2023-05-19 DIAGNOSIS — I480 Paroxysmal atrial fibrillation: Secondary | ICD-10-CM | POA: Diagnosis not present

## 2023-05-20 DIAGNOSIS — I480 Paroxysmal atrial fibrillation: Secondary | ICD-10-CM | POA: Diagnosis not present

## 2023-05-20 DIAGNOSIS — E1122 Type 2 diabetes mellitus with diabetic chronic kidney disease: Secondary | ICD-10-CM | POA: Diagnosis not present

## 2023-05-20 DIAGNOSIS — E1151 Type 2 diabetes mellitus with diabetic peripheral angiopathy without gangrene: Secondary | ICD-10-CM | POA: Diagnosis not present

## 2023-05-20 DIAGNOSIS — N1832 Chronic kidney disease, stage 3b: Secondary | ICD-10-CM | POA: Diagnosis not present

## 2023-05-20 DIAGNOSIS — E1140 Type 2 diabetes mellitus with diabetic neuropathy, unspecified: Secondary | ICD-10-CM | POA: Diagnosis not present

## 2023-05-20 DIAGNOSIS — I5032 Chronic diastolic (congestive) heart failure: Secondary | ICD-10-CM | POA: Diagnosis not present

## 2023-05-20 DIAGNOSIS — L89890 Pressure ulcer of other site, unstageable: Secondary | ICD-10-CM | POA: Diagnosis not present

## 2023-05-20 DIAGNOSIS — J449 Chronic obstructive pulmonary disease, unspecified: Secondary | ICD-10-CM | POA: Diagnosis not present

## 2023-05-20 DIAGNOSIS — I130 Hypertensive heart and chronic kidney disease with heart failure and stage 1 through stage 4 chronic kidney disease, or unspecified chronic kidney disease: Secondary | ICD-10-CM | POA: Diagnosis not present

## 2023-05-25 DIAGNOSIS — E1140 Type 2 diabetes mellitus with diabetic neuropathy, unspecified: Secondary | ICD-10-CM | POA: Diagnosis not present

## 2023-05-25 DIAGNOSIS — E1122 Type 2 diabetes mellitus with diabetic chronic kidney disease: Secondary | ICD-10-CM | POA: Diagnosis not present

## 2023-05-25 DIAGNOSIS — I130 Hypertensive heart and chronic kidney disease with heart failure and stage 1 through stage 4 chronic kidney disease, or unspecified chronic kidney disease: Secondary | ICD-10-CM | POA: Diagnosis not present

## 2023-05-25 DIAGNOSIS — N1832 Chronic kidney disease, stage 3b: Secondary | ICD-10-CM | POA: Diagnosis not present

## 2023-05-25 DIAGNOSIS — L89890 Pressure ulcer of other site, unstageable: Secondary | ICD-10-CM | POA: Diagnosis not present

## 2023-05-25 DIAGNOSIS — I5032 Chronic diastolic (congestive) heart failure: Secondary | ICD-10-CM | POA: Diagnosis not present

## 2023-05-25 DIAGNOSIS — J449 Chronic obstructive pulmonary disease, unspecified: Secondary | ICD-10-CM | POA: Diagnosis not present

## 2023-05-25 DIAGNOSIS — E1151 Type 2 diabetes mellitus with diabetic peripheral angiopathy without gangrene: Secondary | ICD-10-CM | POA: Diagnosis not present

## 2023-05-25 DIAGNOSIS — I480 Paroxysmal atrial fibrillation: Secondary | ICD-10-CM | POA: Diagnosis not present

## 2023-05-27 DIAGNOSIS — I5032 Chronic diastolic (congestive) heart failure: Secondary | ICD-10-CM | POA: Diagnosis not present

## 2023-05-27 DIAGNOSIS — N1832 Chronic kidney disease, stage 3b: Secondary | ICD-10-CM | POA: Diagnosis not present

## 2023-05-27 DIAGNOSIS — I130 Hypertensive heart and chronic kidney disease with heart failure and stage 1 through stage 4 chronic kidney disease, or unspecified chronic kidney disease: Secondary | ICD-10-CM | POA: Diagnosis not present

## 2023-05-27 DIAGNOSIS — I480 Paroxysmal atrial fibrillation: Secondary | ICD-10-CM | POA: Diagnosis not present

## 2023-05-27 DIAGNOSIS — E1151 Type 2 diabetes mellitus with diabetic peripheral angiopathy without gangrene: Secondary | ICD-10-CM | POA: Diagnosis not present

## 2023-05-27 DIAGNOSIS — L89890 Pressure ulcer of other site, unstageable: Secondary | ICD-10-CM | POA: Diagnosis not present

## 2023-05-27 DIAGNOSIS — E1140 Type 2 diabetes mellitus with diabetic neuropathy, unspecified: Secondary | ICD-10-CM | POA: Diagnosis not present

## 2023-05-27 DIAGNOSIS — J449 Chronic obstructive pulmonary disease, unspecified: Secondary | ICD-10-CM | POA: Diagnosis not present

## 2023-05-27 DIAGNOSIS — E1122 Type 2 diabetes mellitus with diabetic chronic kidney disease: Secondary | ICD-10-CM | POA: Diagnosis not present

## 2023-05-28 DIAGNOSIS — I130 Hypertensive heart and chronic kidney disease with heart failure and stage 1 through stage 4 chronic kidney disease, or unspecified chronic kidney disease: Secondary | ICD-10-CM | POA: Diagnosis not present

## 2023-05-28 DIAGNOSIS — N1832 Chronic kidney disease, stage 3b: Secondary | ICD-10-CM | POA: Diagnosis not present

## 2023-05-28 DIAGNOSIS — I480 Paroxysmal atrial fibrillation: Secondary | ICD-10-CM | POA: Diagnosis not present

## 2023-05-28 DIAGNOSIS — E1140 Type 2 diabetes mellitus with diabetic neuropathy, unspecified: Secondary | ICD-10-CM | POA: Diagnosis not present

## 2023-05-28 DIAGNOSIS — I5032 Chronic diastolic (congestive) heart failure: Secondary | ICD-10-CM | POA: Diagnosis not present

## 2023-05-28 DIAGNOSIS — J449 Chronic obstructive pulmonary disease, unspecified: Secondary | ICD-10-CM | POA: Diagnosis not present

## 2023-05-28 DIAGNOSIS — L89890 Pressure ulcer of other site, unstageable: Secondary | ICD-10-CM | POA: Diagnosis not present

## 2023-05-28 DIAGNOSIS — E1122 Type 2 diabetes mellitus with diabetic chronic kidney disease: Secondary | ICD-10-CM | POA: Diagnosis not present

## 2023-05-28 DIAGNOSIS — E1151 Type 2 diabetes mellitus with diabetic peripheral angiopathy without gangrene: Secondary | ICD-10-CM | POA: Diagnosis not present

## 2023-06-01 DIAGNOSIS — I130 Hypertensive heart and chronic kidney disease with heart failure and stage 1 through stage 4 chronic kidney disease, or unspecified chronic kidney disease: Secondary | ICD-10-CM | POA: Diagnosis not present

## 2023-06-01 DIAGNOSIS — N1832 Chronic kidney disease, stage 3b: Secondary | ICD-10-CM | POA: Diagnosis not present

## 2023-06-01 DIAGNOSIS — E1151 Type 2 diabetes mellitus with diabetic peripheral angiopathy without gangrene: Secondary | ICD-10-CM | POA: Diagnosis not present

## 2023-06-01 DIAGNOSIS — L89890 Pressure ulcer of other site, unstageable: Secondary | ICD-10-CM | POA: Diagnosis not present

## 2023-06-01 DIAGNOSIS — I5032 Chronic diastolic (congestive) heart failure: Secondary | ICD-10-CM | POA: Diagnosis not present

## 2023-06-01 DIAGNOSIS — E1140 Type 2 diabetes mellitus with diabetic neuropathy, unspecified: Secondary | ICD-10-CM | POA: Diagnosis not present

## 2023-06-01 DIAGNOSIS — J449 Chronic obstructive pulmonary disease, unspecified: Secondary | ICD-10-CM | POA: Diagnosis not present

## 2023-06-01 DIAGNOSIS — E1122 Type 2 diabetes mellitus with diabetic chronic kidney disease: Secondary | ICD-10-CM | POA: Diagnosis not present

## 2023-06-01 DIAGNOSIS — I480 Paroxysmal atrial fibrillation: Secondary | ICD-10-CM | POA: Diagnosis not present

## 2023-06-02 DIAGNOSIS — I480 Paroxysmal atrial fibrillation: Secondary | ICD-10-CM | POA: Diagnosis not present

## 2023-06-02 DIAGNOSIS — E1140 Type 2 diabetes mellitus with diabetic neuropathy, unspecified: Secondary | ICD-10-CM | POA: Diagnosis not present

## 2023-06-02 DIAGNOSIS — L89890 Pressure ulcer of other site, unstageable: Secondary | ICD-10-CM | POA: Diagnosis not present

## 2023-06-02 DIAGNOSIS — E1122 Type 2 diabetes mellitus with diabetic chronic kidney disease: Secondary | ICD-10-CM | POA: Diagnosis not present

## 2023-06-02 DIAGNOSIS — J449 Chronic obstructive pulmonary disease, unspecified: Secondary | ICD-10-CM | POA: Diagnosis not present

## 2023-06-02 DIAGNOSIS — N1832 Chronic kidney disease, stage 3b: Secondary | ICD-10-CM | POA: Diagnosis not present

## 2023-06-02 DIAGNOSIS — I130 Hypertensive heart and chronic kidney disease with heart failure and stage 1 through stage 4 chronic kidney disease, or unspecified chronic kidney disease: Secondary | ICD-10-CM | POA: Diagnosis not present

## 2023-06-02 DIAGNOSIS — I5032 Chronic diastolic (congestive) heart failure: Secondary | ICD-10-CM | POA: Diagnosis not present

## 2023-06-02 DIAGNOSIS — E1151 Type 2 diabetes mellitus with diabetic peripheral angiopathy without gangrene: Secondary | ICD-10-CM | POA: Diagnosis not present

## 2023-06-03 DIAGNOSIS — I480 Paroxysmal atrial fibrillation: Secondary | ICD-10-CM | POA: Diagnosis not present

## 2023-06-03 DIAGNOSIS — E1122 Type 2 diabetes mellitus with diabetic chronic kidney disease: Secondary | ICD-10-CM | POA: Diagnosis not present

## 2023-06-03 DIAGNOSIS — E1151 Type 2 diabetes mellitus with diabetic peripheral angiopathy without gangrene: Secondary | ICD-10-CM | POA: Diagnosis not present

## 2023-06-03 DIAGNOSIS — J449 Chronic obstructive pulmonary disease, unspecified: Secondary | ICD-10-CM | POA: Diagnosis not present

## 2023-06-03 DIAGNOSIS — I130 Hypertensive heart and chronic kidney disease with heart failure and stage 1 through stage 4 chronic kidney disease, or unspecified chronic kidney disease: Secondary | ICD-10-CM | POA: Diagnosis not present

## 2023-06-03 DIAGNOSIS — E1140 Type 2 diabetes mellitus with diabetic neuropathy, unspecified: Secondary | ICD-10-CM | POA: Diagnosis not present

## 2023-06-03 DIAGNOSIS — L89890 Pressure ulcer of other site, unstageable: Secondary | ICD-10-CM | POA: Diagnosis not present

## 2023-06-03 DIAGNOSIS — N1832 Chronic kidney disease, stage 3b: Secondary | ICD-10-CM | POA: Diagnosis not present

## 2023-06-03 DIAGNOSIS — I5032 Chronic diastolic (congestive) heart failure: Secondary | ICD-10-CM | POA: Diagnosis not present

## 2023-06-04 DIAGNOSIS — E1140 Type 2 diabetes mellitus with diabetic neuropathy, unspecified: Secondary | ICD-10-CM | POA: Diagnosis not present

## 2023-06-04 DIAGNOSIS — I480 Paroxysmal atrial fibrillation: Secondary | ICD-10-CM | POA: Diagnosis not present

## 2023-06-04 DIAGNOSIS — I5032 Chronic diastolic (congestive) heart failure: Secondary | ICD-10-CM | POA: Diagnosis not present

## 2023-06-04 DIAGNOSIS — E1151 Type 2 diabetes mellitus with diabetic peripheral angiopathy without gangrene: Secondary | ICD-10-CM | POA: Diagnosis not present

## 2023-06-04 DIAGNOSIS — E1122 Type 2 diabetes mellitus with diabetic chronic kidney disease: Secondary | ICD-10-CM | POA: Diagnosis not present

## 2023-06-04 DIAGNOSIS — I130 Hypertensive heart and chronic kidney disease with heart failure and stage 1 through stage 4 chronic kidney disease, or unspecified chronic kidney disease: Secondary | ICD-10-CM | POA: Diagnosis not present

## 2023-06-04 DIAGNOSIS — L89890 Pressure ulcer of other site, unstageable: Secondary | ICD-10-CM | POA: Diagnosis not present

## 2023-06-04 DIAGNOSIS — J449 Chronic obstructive pulmonary disease, unspecified: Secondary | ICD-10-CM | POA: Diagnosis not present

## 2023-06-04 DIAGNOSIS — N1832 Chronic kidney disease, stage 3b: Secondary | ICD-10-CM | POA: Diagnosis not present

## 2023-06-09 DIAGNOSIS — I5032 Chronic diastolic (congestive) heart failure: Secondary | ICD-10-CM | POA: Diagnosis not present

## 2023-06-09 DIAGNOSIS — E1122 Type 2 diabetes mellitus with diabetic chronic kidney disease: Secondary | ICD-10-CM | POA: Diagnosis not present

## 2023-06-09 DIAGNOSIS — I480 Paroxysmal atrial fibrillation: Secondary | ICD-10-CM | POA: Diagnosis not present

## 2023-06-09 DIAGNOSIS — N1832 Chronic kidney disease, stage 3b: Secondary | ICD-10-CM | POA: Diagnosis not present

## 2023-06-09 DIAGNOSIS — I130 Hypertensive heart and chronic kidney disease with heart failure and stage 1 through stage 4 chronic kidney disease, or unspecified chronic kidney disease: Secondary | ICD-10-CM | POA: Diagnosis not present

## 2023-06-09 DIAGNOSIS — L89890 Pressure ulcer of other site, unstageable: Secondary | ICD-10-CM | POA: Diagnosis not present

## 2023-06-09 DIAGNOSIS — E1140 Type 2 diabetes mellitus with diabetic neuropathy, unspecified: Secondary | ICD-10-CM | POA: Diagnosis not present

## 2023-06-09 DIAGNOSIS — E1151 Type 2 diabetes mellitus with diabetic peripheral angiopathy without gangrene: Secondary | ICD-10-CM | POA: Diagnosis not present

## 2023-06-09 DIAGNOSIS — J449 Chronic obstructive pulmonary disease, unspecified: Secondary | ICD-10-CM | POA: Diagnosis not present

## 2023-06-10 DIAGNOSIS — E1151 Type 2 diabetes mellitus with diabetic peripheral angiopathy without gangrene: Secondary | ICD-10-CM | POA: Diagnosis not present

## 2023-06-10 DIAGNOSIS — J449 Chronic obstructive pulmonary disease, unspecified: Secondary | ICD-10-CM | POA: Diagnosis not present

## 2023-06-10 DIAGNOSIS — I130 Hypertensive heart and chronic kidney disease with heart failure and stage 1 through stage 4 chronic kidney disease, or unspecified chronic kidney disease: Secondary | ICD-10-CM | POA: Diagnosis not present

## 2023-06-10 DIAGNOSIS — E1140 Type 2 diabetes mellitus with diabetic neuropathy, unspecified: Secondary | ICD-10-CM | POA: Diagnosis not present

## 2023-06-10 DIAGNOSIS — N1832 Chronic kidney disease, stage 3b: Secondary | ICD-10-CM | POA: Diagnosis not present

## 2023-06-10 DIAGNOSIS — I480 Paroxysmal atrial fibrillation: Secondary | ICD-10-CM | POA: Diagnosis not present

## 2023-06-10 DIAGNOSIS — L89890 Pressure ulcer of other site, unstageable: Secondary | ICD-10-CM | POA: Diagnosis not present

## 2023-06-10 DIAGNOSIS — E1122 Type 2 diabetes mellitus with diabetic chronic kidney disease: Secondary | ICD-10-CM | POA: Diagnosis not present

## 2023-06-10 DIAGNOSIS — I5032 Chronic diastolic (congestive) heart failure: Secondary | ICD-10-CM | POA: Diagnosis not present

## 2023-06-11 DIAGNOSIS — E1140 Type 2 diabetes mellitus with diabetic neuropathy, unspecified: Secondary | ICD-10-CM | POA: Diagnosis not present

## 2023-06-11 DIAGNOSIS — E1151 Type 2 diabetes mellitus with diabetic peripheral angiopathy without gangrene: Secondary | ICD-10-CM | POA: Diagnosis not present

## 2023-06-11 DIAGNOSIS — I5032 Chronic diastolic (congestive) heart failure: Secondary | ICD-10-CM | POA: Diagnosis not present

## 2023-06-11 DIAGNOSIS — M5442 Lumbago with sciatica, left side: Secondary | ICD-10-CM | POA: Diagnosis not present

## 2023-06-11 DIAGNOSIS — I1310 Hypertensive heart and chronic kidney disease without heart failure, with stage 1 through stage 4 chronic kidney disease, or unspecified chronic kidney disease: Secondary | ICD-10-CM | POA: Diagnosis not present

## 2023-06-11 DIAGNOSIS — M5441 Lumbago with sciatica, right side: Secondary | ICD-10-CM | POA: Diagnosis not present

## 2023-06-11 DIAGNOSIS — N183 Chronic kidney disease, stage 3 unspecified: Secondary | ICD-10-CM | POA: Diagnosis not present

## 2023-06-11 DIAGNOSIS — N1832 Chronic kidney disease, stage 3b: Secondary | ICD-10-CM | POA: Diagnosis not present

## 2023-06-11 DIAGNOSIS — E785 Hyperlipidemia, unspecified: Secondary | ICD-10-CM | POA: Diagnosis not present

## 2023-06-11 DIAGNOSIS — L89890 Pressure ulcer of other site, unstageable: Secondary | ICD-10-CM | POA: Diagnosis not present

## 2023-06-11 DIAGNOSIS — G8929 Other chronic pain: Secondary | ICD-10-CM | POA: Diagnosis not present

## 2023-06-11 DIAGNOSIS — I130 Hypertensive heart and chronic kidney disease with heart failure and stage 1 through stage 4 chronic kidney disease, or unspecified chronic kidney disease: Secondary | ICD-10-CM | POA: Diagnosis not present

## 2023-06-11 DIAGNOSIS — J449 Chronic obstructive pulmonary disease, unspecified: Secondary | ICD-10-CM | POA: Diagnosis not present

## 2023-06-11 DIAGNOSIS — E1122 Type 2 diabetes mellitus with diabetic chronic kidney disease: Secondary | ICD-10-CM | POA: Diagnosis not present

## 2023-06-11 DIAGNOSIS — I480 Paroxysmal atrial fibrillation: Secondary | ICD-10-CM | POA: Diagnosis not present

## 2023-06-12 ENCOUNTER — Other Ambulatory Visit: Payer: Self-pay | Admitting: Cardiology

## 2023-06-12 DIAGNOSIS — I480 Paroxysmal atrial fibrillation: Secondary | ICD-10-CM

## 2023-06-14 NOTE — Telephone Encounter (Signed)
 Prescription refill request for Eliquis received. Indication:afib Last office visit:needs cardiology appt Scr:0.89  12/24 Age: 78 Weight:48.9  kg  Prescription refilled

## 2023-06-16 DIAGNOSIS — I130 Hypertensive heart and chronic kidney disease with heart failure and stage 1 through stage 4 chronic kidney disease, or unspecified chronic kidney disease: Secondary | ICD-10-CM | POA: Diagnosis not present

## 2023-06-16 DIAGNOSIS — J449 Chronic obstructive pulmonary disease, unspecified: Secondary | ICD-10-CM | POA: Diagnosis not present

## 2023-06-16 DIAGNOSIS — I480 Paroxysmal atrial fibrillation: Secondary | ICD-10-CM | POA: Diagnosis not present

## 2023-06-16 DIAGNOSIS — N1832 Chronic kidney disease, stage 3b: Secondary | ICD-10-CM | POA: Diagnosis not present

## 2023-06-16 DIAGNOSIS — E1151 Type 2 diabetes mellitus with diabetic peripheral angiopathy without gangrene: Secondary | ICD-10-CM | POA: Diagnosis not present

## 2023-06-16 DIAGNOSIS — L89890 Pressure ulcer of other site, unstageable: Secondary | ICD-10-CM | POA: Diagnosis not present

## 2023-06-16 DIAGNOSIS — I5032 Chronic diastolic (congestive) heart failure: Secondary | ICD-10-CM | POA: Diagnosis not present

## 2023-06-16 DIAGNOSIS — E1140 Type 2 diabetes mellitus with diabetic neuropathy, unspecified: Secondary | ICD-10-CM | POA: Diagnosis not present

## 2023-06-16 DIAGNOSIS — E1122 Type 2 diabetes mellitus with diabetic chronic kidney disease: Secondary | ICD-10-CM | POA: Diagnosis not present

## 2023-06-17 DIAGNOSIS — I480 Paroxysmal atrial fibrillation: Secondary | ICD-10-CM | POA: Diagnosis not present

## 2023-06-17 DIAGNOSIS — E1140 Type 2 diabetes mellitus with diabetic neuropathy, unspecified: Secondary | ICD-10-CM | POA: Diagnosis not present

## 2023-06-17 DIAGNOSIS — I130 Hypertensive heart and chronic kidney disease with heart failure and stage 1 through stage 4 chronic kidney disease, or unspecified chronic kidney disease: Secondary | ICD-10-CM | POA: Diagnosis not present

## 2023-06-17 DIAGNOSIS — N1832 Chronic kidney disease, stage 3b: Secondary | ICD-10-CM | POA: Diagnosis not present

## 2023-06-17 DIAGNOSIS — J449 Chronic obstructive pulmonary disease, unspecified: Secondary | ICD-10-CM | POA: Diagnosis not present

## 2023-06-17 DIAGNOSIS — E1122 Type 2 diabetes mellitus with diabetic chronic kidney disease: Secondary | ICD-10-CM | POA: Diagnosis not present

## 2023-06-17 DIAGNOSIS — E1151 Type 2 diabetes mellitus with diabetic peripheral angiopathy without gangrene: Secondary | ICD-10-CM | POA: Diagnosis not present

## 2023-06-17 DIAGNOSIS — L89890 Pressure ulcer of other site, unstageable: Secondary | ICD-10-CM | POA: Diagnosis not present

## 2023-06-17 DIAGNOSIS — I5032 Chronic diastolic (congestive) heart failure: Secondary | ICD-10-CM | POA: Diagnosis not present

## 2023-06-22 DIAGNOSIS — L89890 Pressure ulcer of other site, unstageable: Secondary | ICD-10-CM | POA: Diagnosis not present

## 2023-06-22 DIAGNOSIS — J449 Chronic obstructive pulmonary disease, unspecified: Secondary | ICD-10-CM | POA: Diagnosis not present

## 2023-06-22 DIAGNOSIS — I130 Hypertensive heart and chronic kidney disease with heart failure and stage 1 through stage 4 chronic kidney disease, or unspecified chronic kidney disease: Secondary | ICD-10-CM | POA: Diagnosis not present

## 2023-06-22 DIAGNOSIS — I480 Paroxysmal atrial fibrillation: Secondary | ICD-10-CM | POA: Diagnosis not present

## 2023-06-22 DIAGNOSIS — E1122 Type 2 diabetes mellitus with diabetic chronic kidney disease: Secondary | ICD-10-CM | POA: Diagnosis not present

## 2023-06-22 DIAGNOSIS — E1140 Type 2 diabetes mellitus with diabetic neuropathy, unspecified: Secondary | ICD-10-CM | POA: Diagnosis not present

## 2023-06-22 DIAGNOSIS — I5032 Chronic diastolic (congestive) heart failure: Secondary | ICD-10-CM | POA: Diagnosis not present

## 2023-06-22 DIAGNOSIS — N1832 Chronic kidney disease, stage 3b: Secondary | ICD-10-CM | POA: Diagnosis not present

## 2023-06-22 DIAGNOSIS — E1151 Type 2 diabetes mellitus with diabetic peripheral angiopathy without gangrene: Secondary | ICD-10-CM | POA: Diagnosis not present

## 2023-06-23 DIAGNOSIS — J449 Chronic obstructive pulmonary disease, unspecified: Secondary | ICD-10-CM | POA: Diagnosis not present

## 2023-06-23 DIAGNOSIS — I5032 Chronic diastolic (congestive) heart failure: Secondary | ICD-10-CM | POA: Diagnosis not present

## 2023-06-23 DIAGNOSIS — E1140 Type 2 diabetes mellitus with diabetic neuropathy, unspecified: Secondary | ICD-10-CM | POA: Diagnosis not present

## 2023-06-23 DIAGNOSIS — E1122 Type 2 diabetes mellitus with diabetic chronic kidney disease: Secondary | ICD-10-CM | POA: Diagnosis not present

## 2023-06-23 DIAGNOSIS — I480 Paroxysmal atrial fibrillation: Secondary | ICD-10-CM | POA: Diagnosis not present

## 2023-06-23 DIAGNOSIS — N1832 Chronic kidney disease, stage 3b: Secondary | ICD-10-CM | POA: Diagnosis not present

## 2023-06-23 DIAGNOSIS — I130 Hypertensive heart and chronic kidney disease with heart failure and stage 1 through stage 4 chronic kidney disease, or unspecified chronic kidney disease: Secondary | ICD-10-CM | POA: Diagnosis not present

## 2023-06-23 DIAGNOSIS — L89890 Pressure ulcer of other site, unstageable: Secondary | ICD-10-CM | POA: Diagnosis not present

## 2023-06-23 DIAGNOSIS — E1151 Type 2 diabetes mellitus with diabetic peripheral angiopathy without gangrene: Secondary | ICD-10-CM | POA: Diagnosis not present

## 2023-06-25 DIAGNOSIS — J449 Chronic obstructive pulmonary disease, unspecified: Secondary | ICD-10-CM | POA: Diagnosis not present

## 2023-06-25 DIAGNOSIS — L89890 Pressure ulcer of other site, unstageable: Secondary | ICD-10-CM | POA: Diagnosis not present

## 2023-06-25 DIAGNOSIS — E1122 Type 2 diabetes mellitus with diabetic chronic kidney disease: Secondary | ICD-10-CM | POA: Diagnosis not present

## 2023-06-25 DIAGNOSIS — I5032 Chronic diastolic (congestive) heart failure: Secondary | ICD-10-CM | POA: Diagnosis not present

## 2023-06-25 DIAGNOSIS — N1832 Chronic kidney disease, stage 3b: Secondary | ICD-10-CM | POA: Diagnosis not present

## 2023-06-25 DIAGNOSIS — I130 Hypertensive heart and chronic kidney disease with heart failure and stage 1 through stage 4 chronic kidney disease, or unspecified chronic kidney disease: Secondary | ICD-10-CM | POA: Diagnosis not present

## 2023-06-25 DIAGNOSIS — E1140 Type 2 diabetes mellitus with diabetic neuropathy, unspecified: Secondary | ICD-10-CM | POA: Diagnosis not present

## 2023-06-25 DIAGNOSIS — E1151 Type 2 diabetes mellitus with diabetic peripheral angiopathy without gangrene: Secondary | ICD-10-CM | POA: Diagnosis not present

## 2023-06-25 DIAGNOSIS — I480 Paroxysmal atrial fibrillation: Secondary | ICD-10-CM | POA: Diagnosis not present

## 2023-06-29 DIAGNOSIS — E1151 Type 2 diabetes mellitus with diabetic peripheral angiopathy without gangrene: Secondary | ICD-10-CM | POA: Diagnosis not present

## 2023-06-29 DIAGNOSIS — E1122 Type 2 diabetes mellitus with diabetic chronic kidney disease: Secondary | ICD-10-CM | POA: Diagnosis not present

## 2023-06-29 DIAGNOSIS — E1140 Type 2 diabetes mellitus with diabetic neuropathy, unspecified: Secondary | ICD-10-CM | POA: Diagnosis not present

## 2023-06-29 DIAGNOSIS — I130 Hypertensive heart and chronic kidney disease with heart failure and stage 1 through stage 4 chronic kidney disease, or unspecified chronic kidney disease: Secondary | ICD-10-CM | POA: Diagnosis not present

## 2023-06-29 DIAGNOSIS — I5032 Chronic diastolic (congestive) heart failure: Secondary | ICD-10-CM | POA: Diagnosis not present

## 2023-06-29 DIAGNOSIS — L89890 Pressure ulcer of other site, unstageable: Secondary | ICD-10-CM | POA: Diagnosis not present

## 2023-06-29 DIAGNOSIS — I480 Paroxysmal atrial fibrillation: Secondary | ICD-10-CM | POA: Diagnosis not present

## 2023-06-29 DIAGNOSIS — J449 Chronic obstructive pulmonary disease, unspecified: Secondary | ICD-10-CM | POA: Diagnosis not present

## 2023-06-29 DIAGNOSIS — N1832 Chronic kidney disease, stage 3b: Secondary | ICD-10-CM | POA: Diagnosis not present

## 2023-06-30 DIAGNOSIS — I130 Hypertensive heart and chronic kidney disease with heart failure and stage 1 through stage 4 chronic kidney disease, or unspecified chronic kidney disease: Secondary | ICD-10-CM | POA: Diagnosis not present

## 2023-06-30 DIAGNOSIS — L89890 Pressure ulcer of other site, unstageable: Secondary | ICD-10-CM | POA: Diagnosis not present

## 2023-06-30 DIAGNOSIS — I480 Paroxysmal atrial fibrillation: Secondary | ICD-10-CM | POA: Diagnosis not present

## 2023-06-30 DIAGNOSIS — N1832 Chronic kidney disease, stage 3b: Secondary | ICD-10-CM | POA: Diagnosis not present

## 2023-06-30 DIAGNOSIS — E1151 Type 2 diabetes mellitus with diabetic peripheral angiopathy without gangrene: Secondary | ICD-10-CM | POA: Diagnosis not present

## 2023-06-30 DIAGNOSIS — E1140 Type 2 diabetes mellitus with diabetic neuropathy, unspecified: Secondary | ICD-10-CM | POA: Diagnosis not present

## 2023-06-30 DIAGNOSIS — E1122 Type 2 diabetes mellitus with diabetic chronic kidney disease: Secondary | ICD-10-CM | POA: Diagnosis not present

## 2023-06-30 DIAGNOSIS — J449 Chronic obstructive pulmonary disease, unspecified: Secondary | ICD-10-CM | POA: Diagnosis not present

## 2023-06-30 DIAGNOSIS — I5032 Chronic diastolic (congestive) heart failure: Secondary | ICD-10-CM | POA: Diagnosis not present

## 2023-07-01 DIAGNOSIS — E1122 Type 2 diabetes mellitus with diabetic chronic kidney disease: Secondary | ICD-10-CM | POA: Diagnosis not present

## 2023-07-01 DIAGNOSIS — I5032 Chronic diastolic (congestive) heart failure: Secondary | ICD-10-CM | POA: Diagnosis not present

## 2023-07-01 DIAGNOSIS — I130 Hypertensive heart and chronic kidney disease with heart failure and stage 1 through stage 4 chronic kidney disease, or unspecified chronic kidney disease: Secondary | ICD-10-CM | POA: Diagnosis not present

## 2023-07-01 DIAGNOSIS — N1832 Chronic kidney disease, stage 3b: Secondary | ICD-10-CM | POA: Diagnosis not present

## 2023-07-01 DIAGNOSIS — L89890 Pressure ulcer of other site, unstageable: Secondary | ICD-10-CM | POA: Diagnosis not present

## 2023-07-01 DIAGNOSIS — J449 Chronic obstructive pulmonary disease, unspecified: Secondary | ICD-10-CM | POA: Diagnosis not present

## 2023-07-01 DIAGNOSIS — E1151 Type 2 diabetes mellitus with diabetic peripheral angiopathy without gangrene: Secondary | ICD-10-CM | POA: Diagnosis not present

## 2023-07-01 DIAGNOSIS — E1140 Type 2 diabetes mellitus with diabetic neuropathy, unspecified: Secondary | ICD-10-CM | POA: Diagnosis not present

## 2023-07-01 DIAGNOSIS — I480 Paroxysmal atrial fibrillation: Secondary | ICD-10-CM | POA: Diagnosis not present

## 2023-07-03 ENCOUNTER — Other Ambulatory Visit: Payer: Self-pay | Admitting: Internal Medicine

## 2023-07-05 NOTE — Telephone Encounter (Signed)
 NOT [facility] PATIENT

## 2023-07-19 ENCOUNTER — Telehealth: Payer: Self-pay | Admitting: Cardiology

## 2023-07-19 ENCOUNTER — Other Ambulatory Visit: Payer: Self-pay

## 2023-07-19 ENCOUNTER — Other Ambulatory Visit (HOSPITAL_COMMUNITY): Payer: Self-pay

## 2023-07-19 DIAGNOSIS — I480 Paroxysmal atrial fibrillation: Secondary | ICD-10-CM

## 2023-07-19 MED ORDER — APIXABAN 5 MG PO TABS
5.0000 mg | ORAL_TABLET | Freq: Two times a day (BID) | ORAL | 5 refills | Status: DC
  Filled 2023-07-19: qty 60, 30d supply, fill #0

## 2023-07-19 MED ORDER — APIXABAN 5 MG PO TABS: 5.0000 mg | ORAL_TABLET | Freq: Two times a day (BID) | ORAL | 0 refills | Status: DC

## 2023-07-19 NOTE — Telephone Encounter (Signed)
 Scheduled a FU for patient to get refill on Eliquis. CB # [phone]

## 2023-07-19 NOTE — Telephone Encounter (Signed)
 Prescription refill request for Eliquis received. Indication:afib Last office visit:needs visit Scr:0.89  12/24 Age: 78 Weight:48.9  kg  Prescription refilled

## 2023-07-19 NOTE — Telephone Encounter (Signed)
 Prescription refill request for Eliquis received. Indication:AFIB Last office visit:upcoming Scr:0.89 Age: 78 Weight:48.9  kg  Prescription refilled

## 2023-07-24 DIAGNOSIS — D631 Anemia in chronic kidney disease: Secondary | ICD-10-CM | POA: Diagnosis not present

## 2023-07-24 DIAGNOSIS — E1151 Type 2 diabetes mellitus with diabetic peripheral angiopathy without gangrene: Secondary | ICD-10-CM | POA: Diagnosis not present

## 2023-07-24 DIAGNOSIS — I130 Hypertensive heart and chronic kidney disease with heart failure and stage 1 through stage 4 chronic kidney disease, or unspecified chronic kidney disease: Secondary | ICD-10-CM | POA: Diagnosis not present

## 2023-07-24 DIAGNOSIS — I5032 Chronic diastolic (congestive) heart failure: Secondary | ICD-10-CM | POA: Diagnosis not present

## 2023-07-24 DIAGNOSIS — I480 Paroxysmal atrial fibrillation: Secondary | ICD-10-CM | POA: Diagnosis not present

## 2023-07-24 DIAGNOSIS — N184 Chronic kidney disease, stage 4 (severe): Secondary | ICD-10-CM | POA: Diagnosis not present

## 2023-07-24 DIAGNOSIS — Z466 Encounter for fitting and adjustment of urinary device: Secondary | ICD-10-CM | POA: Diagnosis not present

## 2023-07-24 DIAGNOSIS — E1122 Type 2 diabetes mellitus with diabetic chronic kidney disease: Secondary | ICD-10-CM | POA: Diagnosis not present

## 2023-08-02 ENCOUNTER — Other Ambulatory Visit: Payer: Self-pay

## 2023-08-02 DIAGNOSIS — I2699 Other pulmonary embolism without acute cor pulmonale: Secondary | ICD-10-CM | POA: Insufficient documentation

## 2023-08-02 DIAGNOSIS — N183 Chronic kidney disease, stage 3 unspecified: Secondary | ICD-10-CM | POA: Insufficient documentation

## 2023-08-05 ENCOUNTER — Encounter: Payer: Self-pay | Admitting: Cardiology

## 2023-08-05 ENCOUNTER — Ambulatory Visit: Attending: Cardiology | Admitting: Cardiology

## 2023-08-05 VITALS — BP 100/60 | HR 72 | Ht <= 58 in | Wt 110.0 lb

## 2023-08-05 DIAGNOSIS — J431 Panlobular emphysema: Secondary | ICD-10-CM

## 2023-08-05 DIAGNOSIS — E782 Mixed hyperlipidemia: Secondary | ICD-10-CM

## 2023-08-05 DIAGNOSIS — G4733 Obstructive sleep apnea (adult) (pediatric): Secondary | ICD-10-CM

## 2023-08-05 DIAGNOSIS — I10 Essential (primary) hypertension: Secondary | ICD-10-CM | POA: Diagnosis not present

## 2023-08-05 DIAGNOSIS — E088 Diabetes mellitus due to underlying condition with unspecified complications: Secondary | ICD-10-CM

## 2023-08-05 DIAGNOSIS — N189 Chronic kidney disease, unspecified: Secondary | ICD-10-CM

## 2023-08-05 DIAGNOSIS — I480 Paroxysmal atrial fibrillation: Secondary | ICD-10-CM | POA: Diagnosis not present

## 2023-08-05 MED ORDER — SPIRONOLACTONE 25 MG PO TABS: 12.5000 mg | ORAL_TABLET | Freq: Every day | ORAL | 3 refills | Status: AC

## 2023-08-05 MED ORDER — PRAVASTATIN SODIUM 20 MG PO TABS: 20.0000 mg | ORAL_TABLET | Freq: Every day | ORAL | 3 refills | Status: DC

## 2023-08-05 NOTE — Patient Instructions (Signed)
 Medication Instructions:  Your physician has recommended you make the following change in your medication:   Decrease Pravastatin  to 20 mg daily  Decrease Spironolactone  to 12.5 mg daily.  *If you need a refill on your cardiac medications before your next appointment, please call your pharmacy*   Lab Work: None ordered If you have labs (blood work) drawn today and your tests are completely normal, you will receive your results only by: MyChart Message (if you have MyChart) OR A paper copy in the mail If you have any lab test that is abnormal or we need to change your treatment, we will call you to review the results.   Testing/Procedures: None ordered   Follow-Up: At [facility], you and your health needs are our priority.  As part of our continuing mission to provide you with exceptional heart care, we have created designated Provider Care Teams.  These Care Teams include your primary Cardiologist (physician) and Advanced Practice Providers (APPs -  Physician Assistants and Nurse Practitioners) who all work together to provide you with the care you need, when you need it.  We recommend signing up for the patient portal called "MyChart".  Sign up information is provided on this After Visit Summary.  MyChart is used to connect with patients for Virtual Visits (Telemedicine).  Patients are able to view lab/test results, encounter notes, upcoming appointments, etc.  Non-urgent messages can be sent to your provider as well.   To learn more about what you can do with MyChart, go to [email or web address].    Your next appointment:   9 month(s)  The format for your next appointment:   In Person  Provider:   Hillis Lu, MD    Other Instructions none  Important Information About Sugar

## 2023-08-05 NOTE — Progress Notes (Signed)
 Cardiology Office Note:    Date:  08/05/2023   ID:  Lisa Maxwell, Lisa Maxwell 02-24-1946, MRN [id number]  PCP:  Adrian Hopper, MD  Cardiologist:  Nelia Balzarine, MD   Referring MD: Adrian Hopper, MD    ASSESSMENT:    1. PAF (paroxysmal atrial fibrillation) (HCC)   2. Essential hypertension   3. OSA (obstructive sleep apnea)   4. Panlobular emphysema (HCC)   5. Diabetes mellitus due to underlying condition with unspecified complications (HCC)   6. Chronic kidney disease, unspecified CKD stage   7. Mixed hyperlipidemia    PLAN:    In order of problems listed above:  Primary prevention stressed with the patient.  Importance of compliance with diet medication stressed and patient verbalized standing. Atrial fibrillation:I discussed with the patient atrial fibrillation, disease process. Management and therapy including rate and rhythm control, anticoagulation benefits and potential risks were discussed extensively with the patient. Patient had multiple questions which were answered to patient's satisfaction. Essential hypertension: Borderline blood pressure therefore I will cut back her spironolactone  to half dose.  He will keep a track of pulse blood pressure and send it to me. Mixed dyslipidemia: LDL is 70 and I cut back on the statin medicine.  Lipids followed by primary care.  Diet emphasized. Patient will be seen in follow-up appointment in 6 months or earlier if the patient has any concerns.    Medication Adjustments/Labs and Tests Ordered: Current medicines are reviewed at length with the patient today.  Concerns regarding medicines are outlined above.  No orders of the defined types were placed in this encounter.  No orders of the defined types were placed in this encounter.    No chief complaint on file.    History of Present Illness:    Lisa Maxwell is a 78 y.o. female.  Patient has past medical history of paroxysmal atrial fibrillation, essential  hypertension, mixed dyslipidemia and diabetes mellitus.  She denies any problems at this time and essentially is wheelchair-bound.  She has had severe for diabetes as spinal vertebral issues.  No chest pain orthopnea or PND.  She is wearing a brace.  Her husband is very supportive.  At the time of my evaluation, the patient is alert awake oriented and in no distress.  Past Medical History:  Diagnosis Date   Abnormal barium swallow 06/17/2022   Anemia, chronic disease 03/29/2023   Anorexia 06/17/2022   Aspiration pneumonia (HCC) 03/22/2023   Atrial fibrillation (HCC) 04/30/2015   Atrial fibrillation with RVR (HCC) 03/22/2023   Atrial fibrillation, chronic (HCC) 09/28/2018   CAD (coronary artery disease) 11/04/2016   Cancer (HCC)    skin cancer   Cauda equina compression (HCC) 08/28/2021   Cerebrovascular disease    Chronic atrial fibrillation (HCC) 09/28/2018   Chronic bilateral low back pain without sciatica 03/08/2018   Chronic CHF (congestive heart failure) (HCC) 03/22/2023   Chronic kidney disease    Chronic obstructive lung disease (HCC) 09/28/2018   Chronic pain syndrome 03/08/2018   Chronic renal failure 09/28/2018   Chronic, continuous use of opioids 03/04/2021   Last Assessment & Plan:  Formatting of this note might be different from the original. Patient and I have discussed the hazardous effects of continued opiate pain medication usage. Risks and benefits of above medications including but not limited to possibility of respiratory depression, sedation, and even death were discussed with the patient who expressed an understanding.  Patient did not displ   CKD (chronic kidney  disease) stage 3, GFR 30-59 ml/min (HCC)    CKD stage 3b, GFR 30-44 ml/min (HCC)    Class 1 obesity due to excess calories with serious comorbidity and body mass index (BMI) of 34.0 to 34.9 in adult 05/20/2021   Last Assessment & Plan: Formatting of this note might be different from the original. Patient  educated about the detrimental effects of weight as it specifically pertains to pain management and overall health.  Patient's BMI 34.08 Encouraged healthy eating habits and routine low-impact cardiovascular exercises as tolerated.   Comprehensive diabetic foot examination, type 2 DM, encounter for [facility]) 04/29/2021   COPD (chronic obstructive pulmonary disease) (HCC) 09/28/2018   DDD (degenerative disc disease), lumbar 11/17/2018   Last Assessment & Plan:  Formatting of this note might be different from the original. See spinal stenosis plan   Decubitus skin ulcer 11/21/2021   Delirium 03/28/2023   Diabetes (HCC)    Diabetes mellitus due to underlying condition with unspecified complications (HCC) 11/04/2016   DM2 (diabetes mellitus, type 2) (HCC) 04/29/2021   Dyshidrotic eczema 04/29/2021   Dysrhythmia    afib   Esophageal abnormality 06/09/2022   Esophageal stricture 06/17/2022   Essential hypertension 11/04/2016   Facet arthritis of lumbar region 03/08/2018   Facet joint disease 11/17/2018   Last Assessment & Plan:  Formatting of this note might be different from the original. See spinal stenosis plan   Foraminal stenosis of lumbar region 03/08/2018   Heart murmur    History of knee replacement, total, bilateral 11/17/2018   Last Assessment & Plan:  Formatting of this note might be different from the original. Have recommended to her several times if any further concerns in regards to the pain status post arthroplasty, she is to follow up with her surgeon.   History of pulmonary embolism 11/28/2021   History of pulmonary embolus (PE) 05/28/2022   Hypercalcemia 12/01/2022   Hyperlipidemia 12/01/2022   Hypertension    Hypertensive disorder 09/28/2018   Hyponatremia 03/22/2023   Increased body mass index 09/28/2018   L1 vertebral fracture (HCC) 06/09/2022   Low back pain 11/30/2022   Lumbar radiculopathy 03/08/2018   Lumbar spondylosis 03/20/2019   Malnutrition of moderate degree  03/26/2023   Medication management 11/26/2022   Mixed dyslipidemia 11/04/2016   Morbid obesity (HCC) 11/04/2016   Muscle pain 04/29/2020   OSA (obstructive sleep apnea)    Osteoarthritis of left knee    Other dysphagia 06/17/2022   PAF (paroxysmal atrial fibrillation) (HCC) 04/30/2015   Pain management contract agreement 04/13/2019   Last Assessment & Plan:  Formatting of this note might be different from the original. Contract updated today.  UDS completed.  [place]  controlled substance registry reviewed and is consistent with her regimen.   Peripheral vascular disease (HCC) 09/28/2018   Preop cardiovascular exam 05/01/2019   Pressure injury of skin 11/21/2021   Pulmonary emboli [facility])    Pulmonary embolism (HCC) 11/21/2021   Recurrent UTI 11/26/2022   Renal insufficiency 04/30/2015   S/P insertion of spinal cord stimulator 07/13/2019   Last Assessment & Plan:  Formatting of this note might be different from the original. About 5 months status post insertion of spinal cord stimulator with excellent relief.  She continues to keep close follow-up with her representative and has been working to appropriately find best programs to get her adequate pain relief.   Smoker 09/28/2018   Spinal stenosis of lumbar region 03/20/2019   Spinal stenosis of lumbar region with neurogenic claudication  03/08/2018   Last Assessment & Plan:  Formatting of this note is different from the original. 78 year old female with chronic low back pain and bilateral, right greater than left, L4-5 radicular pain, and claudication. She has substantial amount of multifactorial degenerative thoracic and lumbar spine pathology.  After being deemed an inappropriate open surgical candidate, and failing to respond to multiple in   Spondylolisthesis 03/20/2019   Spondylosis of lumbosacral region without myelopathy or radiculopathy 01/30/2019   Last Assessment & Plan:  Formatting of this note might be different from the  original. See spinal stenosis plan   Tinea unguium 02/03/2022   Type 2 diabetes mellitus (HCC) 09/28/2018   Urinary tract infection without hematuria 11/17/2022    Past Surgical History:  Procedure Laterality Date   ABDOMINAL HYSTERECTOMY     BACK SURGERY     BIOPSY  06/18/2022   Procedure: BIOPSY;  Surgeon: Normie Becton., MD;  Location: [facility] ENDOSCOPY;  Service: Gastroenterology;;   ESOPHAGEAL DILATION  03/24/2023   Procedure: ESOPHAGEAL DILATION;  Surgeon: Nannette Babe, MD;  Location: [facility] ENDOSCOPY;  Service: Gastroenterology;;   ESOPHAGOGASTRODUODENOSCOPY (EGD) WITH PROPOFOL  N/A 06/18/2022   Procedure: ESOPHAGOGASTRODUODENOSCOPY (EGD) WITH PROPOFOL ;  Surgeon: Normie Becton., MD;  Location: [facility] ENDOSCOPY;  Service: Gastroenterology;  Laterality: N/A;   ESOPHAGOGASTRODUODENOSCOPY (EGD) WITH PROPOFOL  N/A 03/24/2023   Procedure: ESOPHAGOGASTRODUODENOSCOPY (EGD) WITH PROPOFOL ;  Surgeon: Nannette Babe, MD;  Location: [facility] ENDOSCOPY;  Service: Gastroenterology;  Laterality: N/A;   EYE SURGERY     bilateral cataracts   IR ANGIOGRAM PULMONARY BILATERAL SELECTIVE  11/21/2021   IR ANGIOGRAM SELECTIVE EACH ADDITIONAL VESSEL  11/21/2021   IR ANGIOGRAM SELECTIVE EACH ADDITIONAL VESSEL  11/21/2021   IR THROMBECT PRIM MECH INIT (INCLU) MOD SED  11/21/2021   IR US  GUIDE VASC ACCESS RIGHT  11/21/2021   JOINT REPLACEMENT Bilateral    knees   RADIOLOGY WITH ANESTHESIA N/A 11/21/2021   Procedure: IR WITH ANESTHESIA;  Surgeon: Radiologist, Medication, MD;  Location: [facility] OR;  Service: Radiology;  Laterality: N/A;   SAVORY DILATION N/A 06/18/2022   Procedure: SAVORY DILATION;  Surgeon: Brice Campi Albino Alu., MD;  Location[facility] ENDOSCOPY;  Service: Gastroenterology;  Laterality: N/A;   SPINAL CORD STIMULATOR INSERTION N/A 07/07/2019   Procedure: LUMBAR SPINAL CORD STIMULATOR INSERTION;  Surgeon: Gerri Kras, MD;  Location: [facility] OR;  Service: Neurosurgery;  Laterality: N/A;   total left knee       Current Medications: Current Meds  Medication Sig   albuterol  (VENTOLIN  HFA) 108 (90 Base) MCG/ACT inhaler Inhale into the lungs every 6 (six) hours as needed for wheezing or shortness of breath.   apixaban  (ELIQUIS ) 5 MG TABS tablet Take 1 tablet (5 mg total) by mouth 2 (two) times daily.   bethanechol  (URECHOLINE ) 50 MG tablet Take 50 mg by mouth daily.   docusate sodium  (COLACE) 100 MG capsule Take 100 mg by mouth 2 (two) times daily as needed for mild constipation or moderate constipation.   fluticasone  furoate-vilanterol (BREO ELLIPTA ) 100-25 MCG/ACT AEPB Inhale 1-2 puffs into the lungs daily.   furosemide  (LASIX ) 40 MG tablet Take 40 mg by mouth daily.   gabapentin  (NEURONTIN ) 600 MG tablet Take 600 mg by mouth daily.   leptospermum manuka honey (MEDIHONEY) PSTE paste Apply 1 Application topically daily.   lidocaine  (LIDODERM ) 5 % Place 1 patch onto the skin daily. Remove & Discard patch within 12 hours or as directed by MD   methocarbamol  (ROBAXIN ) 500 MG tablet Take 250 mg by  mouth 3 (three) times daily.   Multiple Vitamins-Minerals (CENTRUM SILVER 50+WOMEN) TABS Take 1 tablet by mouth daily with breakfast.   nitroGLYCERIN  (NITROSTAT ) 0.4 MG SL tablet Place 0.4 mg under the tongue every 5 (five) minutes as needed for chest pain.   nystatin  cream (MYCOSTATIN ) Apply 1 Application topically 2 (two) times daily.   Oxycodone  HCl 10 MG TABS Take 10 mg by mouth every 6 (six) hours as needed (pain).   pantoprazole  (PROTONIX ) 40 MG tablet Take 1 tablet (40 mg total) by mouth daily.   potassium chloride SA (KLOR-CON M) 20 MEQ tablet Take 20 mEq by mouth daily.   pravastatin  (PRAVACHOL ) 40 MG tablet Take 1 tablet (40 mg total) by mouth daily at 6 PM.   RYBELSUS  3 MG TABS Take 1 tablet by mouth daily.   spironolactone  (ALDACTONE ) 25 MG tablet Take 25 mg by mouth daily.   triamcinolone  ointment (KENALOG ) 0.5 % Apply 1 Application topically at bedtime.     Allergies:   Patient has no known  allergies.   Social History   Socioeconomic History   Marital status: Married    Spouse name: Not on file   Number of children: 2   Years of education: Not on file   Highest education level: Not on file  Occupational History   Not on file  Tobacco Use   Smoking status: Some Days    Current packs/day: 0.00    Average packs/day: 0.5 packs/day for 62.0 years (31.0 ttl pk-yrs)    Types: Cigarettes    Start date: 05/07/1957    Last attempt to quit: 05/08/2019    Years since quitting: 4.2    Passive exposure: Past   Smokeless tobacco: Never  Vaping Use   Vaping status: Never Used  Substance and Sexual Activity   Alcohol use: No   Drug use: No   Sexual activity: Not Currently  Other Topics Concern   Not on file  Social History Narrative   Not on file   Social Drivers of Health   Financial Resource Strain: Low Risk  (07/31/2022)   Overall Financial Resource Strain (CARDIA)    Difficulty of Paying Living Expenses: Not hard at all  Food Insecurity: No Food Insecurity (03/22/2023)   Hunger Vital Sign    Worried About Running Out of Food in the Last Year: Never true    Ran Out of Food in the Last Year: Never true  Transportation Needs: No Transportation Needs (03/22/2023)   PRAPARE - Administrator, Civil Service (Medical): No    Lack of Transportation (Non-Medical): No  Physical Activity: Unknown (07/31/2022)   Exercise Vital Sign    Days of Exercise per Week: Not on file    Minutes of Exercise per Session: 0 min  Stress: Not on file  Social Connections: Not on file     Family History: The patient's family history includes Diabetes in her mother.  ROS:   Please see the history of present illness.    All other systems reviewed and are negative.  EKGs/Labs/Other Studies Reviewed:    The following studies were reviewed today: I discussed my findings with the patient at length   Recent Labs: 12/03/2022: Magnesium  1.8 03/22/2023: B Natriuretic Peptide  168.5 03/24/2023: ALT 20 03/30/2023: BUN 12; Creatinine, Ser 0.89; Hemoglobin 10.7; Platelets 267; Potassium 4.2; Sodium 135  Recent Lipid Panel No results found for: "CHOL", "TRIG", "HDL", "CHOLHDL", "VLDL", "LDLCALC", "LDLDIRECT"  Physical Exam:    VS:  BP 100/60  Pulse 72   Ht 4\' 8"  (1.422 m)   Wt 110 lb (49.9 kg)   SpO2 91%   BMI 24.66 kg/m     Wt Readings from Last 3 Encounters:  08/05/23 110 lb (49.9 kg)  04/02/23 107 lb 12.9 oz (48.9 kg)  12/07/22 144 lb 13.5 oz (65.7 kg)     GEN: Patient is in no acute distress HEENT: Normal NECK: No JVD; No carotid bruits LYMPHATICS: No lymphadenopathy CARDIAC: Hear sounds regular, 2/6 systolic murmur at the apex. RESPIRATORY:  Clear to auscultation without rales, wheezing or rhonchi  ABDOMEN: Soft, non-tender, non-distended MUSCULOSKELETAL:  No edema; No deformity  SKIN: Warm and dry NEUROLOGIC:  Alert and oriented x 3 PSYCHIATRIC:  Normal affect   Signed, Nelia Balzarine, MD  08/05/2023 4:27 PM    [facility]

## 2023-08-10 ENCOUNTER — Other Ambulatory Visit: Payer: Self-pay | Admitting: Internal Medicine

## 2023-08-10 ENCOUNTER — Other Ambulatory Visit: Payer: Self-pay | Admitting: Cardiology

## 2023-08-10 DIAGNOSIS — I480 Paroxysmal atrial fibrillation: Secondary | ICD-10-CM

## 2023-08-10 NOTE — Telephone Encounter (Signed)
 NOT [facility] PATIENT

## 2023-08-10 NOTE — Telephone Encounter (Signed)
 Prescription refill request for Eliquis  received. Indication: PF Last office visit: 08/05/23  R Revankar MD Scr: 0.89 on 03/30/23  Epic Age: 78 Weight: 49.9kg  Based on above findings Eliquis  5mg  twice daily is the appropriate dose.  Refill approved.

## 2023-08-23 DIAGNOSIS — N184 Chronic kidney disease, stage 4 (severe): Secondary | ICD-10-CM | POA: Diagnosis not present

## 2023-08-23 DIAGNOSIS — D631 Anemia in chronic kidney disease: Secondary | ICD-10-CM | POA: Diagnosis not present

## 2023-08-23 DIAGNOSIS — Z466 Encounter for fitting and adjustment of urinary device: Secondary | ICD-10-CM | POA: Diagnosis not present

## 2023-08-23 DIAGNOSIS — I480 Paroxysmal atrial fibrillation: Secondary | ICD-10-CM | POA: Diagnosis not present

## 2023-08-23 DIAGNOSIS — I130 Hypertensive heart and chronic kidney disease with heart failure and stage 1 through stage 4 chronic kidney disease, or unspecified chronic kidney disease: Secondary | ICD-10-CM | POA: Diagnosis not present

## 2023-08-23 DIAGNOSIS — E1122 Type 2 diabetes mellitus with diabetic chronic kidney disease: Secondary | ICD-10-CM | POA: Diagnosis not present

## 2023-08-23 DIAGNOSIS — I5032 Chronic diastolic (congestive) heart failure: Secondary | ICD-10-CM | POA: Diagnosis not present

## 2023-08-23 DIAGNOSIS — E1151 Type 2 diabetes mellitus with diabetic peripheral angiopathy without gangrene: Secondary | ICD-10-CM | POA: Diagnosis not present

## 2023-08-24 DIAGNOSIS — I5032 Chronic diastolic (congestive) heart failure: Secondary | ICD-10-CM | POA: Diagnosis not present

## 2023-08-24 DIAGNOSIS — I480 Paroxysmal atrial fibrillation: Secondary | ICD-10-CM | POA: Diagnosis not present

## 2023-08-24 DIAGNOSIS — E1122 Type 2 diabetes mellitus with diabetic chronic kidney disease: Secondary | ICD-10-CM | POA: Diagnosis not present

## 2023-08-24 DIAGNOSIS — Z466 Encounter for fitting and adjustment of urinary device: Secondary | ICD-10-CM | POA: Diagnosis not present

## 2023-08-24 DIAGNOSIS — I130 Hypertensive heart and chronic kidney disease with heart failure and stage 1 through stage 4 chronic kidney disease, or unspecified chronic kidney disease: Secondary | ICD-10-CM | POA: Diagnosis not present

## 2023-08-24 DIAGNOSIS — N184 Chronic kidney disease, stage 4 (severe): Secondary | ICD-10-CM | POA: Diagnosis not present

## 2023-08-24 DIAGNOSIS — D631 Anemia in chronic kidney disease: Secondary | ICD-10-CM | POA: Diagnosis not present

## 2023-08-24 DIAGNOSIS — E1151 Type 2 diabetes mellitus with diabetic peripheral angiopathy without gangrene: Secondary | ICD-10-CM | POA: Diagnosis not present

## 2023-09-10 DIAGNOSIS — E1122 Type 2 diabetes mellitus with diabetic chronic kidney disease: Secondary | ICD-10-CM | POA: Diagnosis not present

## 2023-09-10 DIAGNOSIS — N183 Chronic kidney disease, stage 3 unspecified: Secondary | ICD-10-CM | POA: Diagnosis not present

## 2023-09-10 DIAGNOSIS — Z7901 Long term (current) use of anticoagulants: Secondary | ICD-10-CM | POA: Diagnosis not present

## 2023-09-10 DIAGNOSIS — N2889 Other specified disorders of kidney and ureter: Secondary | ICD-10-CM | POA: Diagnosis not present

## 2023-09-10 DIAGNOSIS — I509 Heart failure, unspecified: Secondary | ICD-10-CM | POA: Diagnosis not present

## 2023-09-10 DIAGNOSIS — F17210 Nicotine dependence, cigarettes, uncomplicated: Secondary | ICD-10-CM | POA: Diagnosis not present

## 2023-09-10 DIAGNOSIS — I130 Hypertensive heart and chronic kidney disease with heart failure and stage 1 through stage 4 chronic kidney disease, or unspecified chronic kidney disease: Secondary | ICD-10-CM | POA: Diagnosis not present

## 2023-09-10 DIAGNOSIS — I4891 Unspecified atrial fibrillation: Secondary | ICD-10-CM | POA: Diagnosis not present

## 2023-09-10 DIAGNOSIS — R310 Gross hematuria: Secondary | ICD-10-CM | POA: Diagnosis not present

## 2023-09-10 DIAGNOSIS — R319 Hematuria, unspecified: Secondary | ICD-10-CM | POA: Diagnosis not present

## 2023-09-11 DIAGNOSIS — J438 Other emphysema: Secondary | ICD-10-CM | POA: Diagnosis not present

## 2023-09-11 DIAGNOSIS — R310 Gross hematuria: Secondary | ICD-10-CM | POA: Diagnosis not present

## 2023-09-11 DIAGNOSIS — N189 Chronic kidney disease, unspecified: Secondary | ICD-10-CM | POA: Diagnosis not present

## 2023-09-11 DIAGNOSIS — I130 Hypertensive heart and chronic kidney disease with heart failure and stage 1 through stage 4 chronic kidney disease, or unspecified chronic kidney disease: Secondary | ICD-10-CM | POA: Diagnosis not present

## 2023-09-11 DIAGNOSIS — E1151 Type 2 diabetes mellitus with diabetic peripheral angiopathy without gangrene: Secondary | ICD-10-CM | POA: Diagnosis not present

## 2023-09-11 DIAGNOSIS — N2889 Other specified disorders of kidney and ureter: Secondary | ICD-10-CM | POA: Diagnosis not present

## 2023-09-11 DIAGNOSIS — I482 Chronic atrial fibrillation, unspecified: Secondary | ICD-10-CM | POA: Diagnosis not present

## 2023-09-11 DIAGNOSIS — Z7901 Long term (current) use of anticoagulants: Secondary | ICD-10-CM | POA: Diagnosis not present

## 2023-09-11 DIAGNOSIS — I509 Heart failure, unspecified: Secondary | ICD-10-CM | POA: Diagnosis not present

## 2023-09-11 DIAGNOSIS — E1122 Type 2 diabetes mellitus with diabetic chronic kidney disease: Secondary | ICD-10-CM | POA: Diagnosis not present

## 2023-09-11 DIAGNOSIS — R319 Hematuria, unspecified: Secondary | ICD-10-CM | POA: Diagnosis not present

## 2023-09-11 DIAGNOSIS — C641 Malignant neoplasm of right kidney, except renal pelvis: Secondary | ICD-10-CM | POA: Diagnosis not present

## 2023-09-11 DIAGNOSIS — I4891 Unspecified atrial fibrillation: Secondary | ICD-10-CM | POA: Diagnosis not present

## 2023-09-11 DIAGNOSIS — N183 Chronic kidney disease, stage 3 unspecified: Secondary | ICD-10-CM | POA: Diagnosis not present

## 2023-09-11 DIAGNOSIS — D62 Acute posthemorrhagic anemia: Secondary | ICD-10-CM | POA: Diagnosis not present

## 2023-09-11 DIAGNOSIS — F17210 Nicotine dependence, cigarettes, uncomplicated: Secondary | ICD-10-CM | POA: Diagnosis not present

## 2023-09-15 DIAGNOSIS — D631 Anemia in chronic kidney disease: Secondary | ICD-10-CM | POA: Diagnosis not present

## 2023-09-15 DIAGNOSIS — I130 Hypertensive heart and chronic kidney disease with heart failure and stage 1 through stage 4 chronic kidney disease, or unspecified chronic kidney disease: Secondary | ICD-10-CM | POA: Diagnosis not present

## 2023-09-15 DIAGNOSIS — E1151 Type 2 diabetes mellitus with diabetic peripheral angiopathy without gangrene: Secondary | ICD-10-CM | POA: Diagnosis not present

## 2023-09-15 DIAGNOSIS — I5032 Chronic diastolic (congestive) heart failure: Secondary | ICD-10-CM | POA: Diagnosis not present

## 2023-09-15 DIAGNOSIS — E1122 Type 2 diabetes mellitus with diabetic chronic kidney disease: Secondary | ICD-10-CM | POA: Diagnosis not present

## 2023-09-15 DIAGNOSIS — R310 Gross hematuria: Secondary | ICD-10-CM | POA: Diagnosis not present

## 2023-09-15 DIAGNOSIS — Z466 Encounter for fitting and adjustment of urinary device: Secondary | ICD-10-CM | POA: Diagnosis not present

## 2023-09-15 DIAGNOSIS — I480 Paroxysmal atrial fibrillation: Secondary | ICD-10-CM | POA: Diagnosis not present

## 2023-09-15 DIAGNOSIS — I1310 Hypertensive heart and chronic kidney disease without heart failure, with stage 1 through stage 4 chronic kidney disease, or unspecified chronic kidney disease: Secondary | ICD-10-CM | POA: Diagnosis not present

## 2023-09-15 DIAGNOSIS — E785 Hyperlipidemia, unspecified: Secondary | ICD-10-CM | POA: Diagnosis not present

## 2023-09-15 DIAGNOSIS — N184 Chronic kidney disease, stage 4 (severe): Secondary | ICD-10-CM | POA: Diagnosis not present

## 2023-09-20 DIAGNOSIS — E1151 Type 2 diabetes mellitus with diabetic peripheral angiopathy without gangrene: Secondary | ICD-10-CM | POA: Diagnosis not present

## 2023-09-20 DIAGNOSIS — N184 Chronic kidney disease, stage 4 (severe): Secondary | ICD-10-CM | POA: Diagnosis not present

## 2023-09-20 DIAGNOSIS — Z466 Encounter for fitting and adjustment of urinary device: Secondary | ICD-10-CM | POA: Diagnosis not present

## 2023-09-20 DIAGNOSIS — E1122 Type 2 diabetes mellitus with diabetic chronic kidney disease: Secondary | ICD-10-CM | POA: Diagnosis not present

## 2023-09-20 DIAGNOSIS — I480 Paroxysmal atrial fibrillation: Secondary | ICD-10-CM | POA: Diagnosis not present

## 2023-09-20 DIAGNOSIS — D631 Anemia in chronic kidney disease: Secondary | ICD-10-CM | POA: Diagnosis not present

## 2023-09-20 DIAGNOSIS — I5032 Chronic diastolic (congestive) heart failure: Secondary | ICD-10-CM | POA: Diagnosis not present

## 2023-09-20 DIAGNOSIS — I130 Hypertensive heart and chronic kidney disease with heart failure and stage 1 through stage 4 chronic kidney disease, or unspecified chronic kidney disease: Secondary | ICD-10-CM | POA: Diagnosis not present

## 2023-09-21 DIAGNOSIS — I5032 Chronic diastolic (congestive) heart failure: Secondary | ICD-10-CM | POA: Diagnosis not present

## 2023-09-21 DIAGNOSIS — I480 Paroxysmal atrial fibrillation: Secondary | ICD-10-CM | POA: Diagnosis not present

## 2023-09-21 DIAGNOSIS — I130 Hypertensive heart and chronic kidney disease with heart failure and stage 1 through stage 4 chronic kidney disease, or unspecified chronic kidney disease: Secondary | ICD-10-CM | POA: Diagnosis not present

## 2023-09-21 DIAGNOSIS — N184 Chronic kidney disease, stage 4 (severe): Secondary | ICD-10-CM | POA: Diagnosis not present

## 2023-09-21 DIAGNOSIS — Z466 Encounter for fitting and adjustment of urinary device: Secondary | ICD-10-CM | POA: Diagnosis not present

## 2023-09-21 DIAGNOSIS — E1151 Type 2 diabetes mellitus with diabetic peripheral angiopathy without gangrene: Secondary | ICD-10-CM | POA: Diagnosis not present

## 2023-09-21 DIAGNOSIS — E1122 Type 2 diabetes mellitus with diabetic chronic kidney disease: Secondary | ICD-10-CM | POA: Diagnosis not present

## 2023-09-21 DIAGNOSIS — D631 Anemia in chronic kidney disease: Secondary | ICD-10-CM | POA: Diagnosis not present

## 2023-09-22 ENCOUNTER — Other Ambulatory Visit: Payer: Self-pay | Admitting: Gastroenterology

## 2023-09-22 DIAGNOSIS — I130 Hypertensive heart and chronic kidney disease with heart failure and stage 1 through stage 4 chronic kidney disease, or unspecified chronic kidney disease: Secondary | ICD-10-CM | POA: Diagnosis not present

## 2023-09-22 DIAGNOSIS — N184 Chronic kidney disease, stage 4 (severe): Secondary | ICD-10-CM | POA: Diagnosis not present

## 2023-09-22 DIAGNOSIS — I5032 Chronic diastolic (congestive) heart failure: Secondary | ICD-10-CM | POA: Diagnosis not present

## 2023-09-22 DIAGNOSIS — E1151 Type 2 diabetes mellitus with diabetic peripheral angiopathy without gangrene: Secondary | ICD-10-CM | POA: Diagnosis not present

## 2023-09-22 DIAGNOSIS — Z466 Encounter for fitting and adjustment of urinary device: Secondary | ICD-10-CM | POA: Diagnosis not present

## 2023-09-22 DIAGNOSIS — D631 Anemia in chronic kidney disease: Secondary | ICD-10-CM | POA: Diagnosis not present

## 2023-09-22 DIAGNOSIS — E1122 Type 2 diabetes mellitus with diabetic chronic kidney disease: Secondary | ICD-10-CM | POA: Diagnosis not present

## 2023-09-22 DIAGNOSIS — I480 Paroxysmal atrial fibrillation: Secondary | ICD-10-CM | POA: Diagnosis not present

## 2023-09-23 DIAGNOSIS — I5032 Chronic diastolic (congestive) heart failure: Secondary | ICD-10-CM | POA: Diagnosis not present

## 2023-09-23 DIAGNOSIS — I480 Paroxysmal atrial fibrillation: Secondary | ICD-10-CM | POA: Diagnosis not present

## 2023-09-23 DIAGNOSIS — E1122 Type 2 diabetes mellitus with diabetic chronic kidney disease: Secondary | ICD-10-CM | POA: Diagnosis not present

## 2023-09-23 DIAGNOSIS — Z466 Encounter for fitting and adjustment of urinary device: Secondary | ICD-10-CM | POA: Diagnosis not present

## 2023-09-23 DIAGNOSIS — N184 Chronic kidney disease, stage 4 (severe): Secondary | ICD-10-CM | POA: Diagnosis not present

## 2023-09-23 DIAGNOSIS — D631 Anemia in chronic kidney disease: Secondary | ICD-10-CM | POA: Diagnosis not present

## 2023-09-23 DIAGNOSIS — I130 Hypertensive heart and chronic kidney disease with heart failure and stage 1 through stage 4 chronic kidney disease, or unspecified chronic kidney disease: Secondary | ICD-10-CM | POA: Diagnosis not present

## 2023-09-23 DIAGNOSIS — E1151 Type 2 diabetes mellitus with diabetic peripheral angiopathy without gangrene: Secondary | ICD-10-CM | POA: Diagnosis not present

## 2023-09-28 DIAGNOSIS — E1122 Type 2 diabetes mellitus with diabetic chronic kidney disease: Secondary | ICD-10-CM | POA: Diagnosis not present

## 2023-09-28 DIAGNOSIS — D631 Anemia in chronic kidney disease: Secondary | ICD-10-CM | POA: Diagnosis not present

## 2023-09-28 DIAGNOSIS — I130 Hypertensive heart and chronic kidney disease with heart failure and stage 1 through stage 4 chronic kidney disease, or unspecified chronic kidney disease: Secondary | ICD-10-CM | POA: Diagnosis not present

## 2023-09-28 DIAGNOSIS — I480 Paroxysmal atrial fibrillation: Secondary | ICD-10-CM | POA: Diagnosis not present

## 2023-09-28 DIAGNOSIS — N184 Chronic kidney disease, stage 4 (severe): Secondary | ICD-10-CM | POA: Diagnosis not present

## 2023-09-28 DIAGNOSIS — Z466 Encounter for fitting and adjustment of urinary device: Secondary | ICD-10-CM | POA: Diagnosis not present

## 2023-09-28 DIAGNOSIS — I5032 Chronic diastolic (congestive) heart failure: Secondary | ICD-10-CM | POA: Diagnosis not present

## 2023-09-28 DIAGNOSIS — E1151 Type 2 diabetes mellitus with diabetic peripheral angiopathy without gangrene: Secondary | ICD-10-CM | POA: Diagnosis not present

## 2023-09-29 DIAGNOSIS — E1122 Type 2 diabetes mellitus with diabetic chronic kidney disease: Secondary | ICD-10-CM | POA: Diagnosis not present

## 2023-09-29 DIAGNOSIS — I480 Paroxysmal atrial fibrillation: Secondary | ICD-10-CM | POA: Diagnosis not present

## 2023-09-29 DIAGNOSIS — E1151 Type 2 diabetes mellitus with diabetic peripheral angiopathy without gangrene: Secondary | ICD-10-CM | POA: Diagnosis not present

## 2023-09-29 DIAGNOSIS — Z466 Encounter for fitting and adjustment of urinary device: Secondary | ICD-10-CM | POA: Diagnosis not present

## 2023-09-29 DIAGNOSIS — I130 Hypertensive heart and chronic kidney disease with heart failure and stage 1 through stage 4 chronic kidney disease, or unspecified chronic kidney disease: Secondary | ICD-10-CM | POA: Diagnosis not present

## 2023-09-29 DIAGNOSIS — I5032 Chronic diastolic (congestive) heart failure: Secondary | ICD-10-CM | POA: Diagnosis not present

## 2023-09-29 DIAGNOSIS — N184 Chronic kidney disease, stage 4 (severe): Secondary | ICD-10-CM | POA: Diagnosis not present

## 2023-09-29 DIAGNOSIS — D631 Anemia in chronic kidney disease: Secondary | ICD-10-CM | POA: Diagnosis not present

## 2023-09-30 DIAGNOSIS — D631 Anemia in chronic kidney disease: Secondary | ICD-10-CM | POA: Diagnosis not present

## 2023-09-30 DIAGNOSIS — E1151 Type 2 diabetes mellitus with diabetic peripheral angiopathy without gangrene: Secondary | ICD-10-CM | POA: Diagnosis not present

## 2023-09-30 DIAGNOSIS — R310 Gross hematuria: Secondary | ICD-10-CM | POA: Diagnosis not present

## 2023-09-30 DIAGNOSIS — I5032 Chronic diastolic (congestive) heart failure: Secondary | ICD-10-CM | POA: Diagnosis not present

## 2023-09-30 DIAGNOSIS — N289 Disorder of kidney and ureter, unspecified: Secondary | ICD-10-CM | POA: Diagnosis not present

## 2023-09-30 DIAGNOSIS — I130 Hypertensive heart and chronic kidney disease with heart failure and stage 1 through stage 4 chronic kidney disease, or unspecified chronic kidney disease: Secondary | ICD-10-CM | POA: Diagnosis not present

## 2023-09-30 DIAGNOSIS — Z978 Presence of other specified devices: Secondary | ICD-10-CM | POA: Diagnosis not present

## 2023-09-30 DIAGNOSIS — Z466 Encounter for fitting and adjustment of urinary device: Secondary | ICD-10-CM | POA: Diagnosis not present

## 2023-09-30 DIAGNOSIS — E1122 Type 2 diabetes mellitus with diabetic chronic kidney disease: Secondary | ICD-10-CM | POA: Diagnosis not present

## 2023-09-30 DIAGNOSIS — I480 Paroxysmal atrial fibrillation: Secondary | ICD-10-CM | POA: Diagnosis not present

## 2023-09-30 DIAGNOSIS — N184 Chronic kidney disease, stage 4 (severe): Secondary | ICD-10-CM | POA: Diagnosis not present

## 2023-10-04 DIAGNOSIS — E1122 Type 2 diabetes mellitus with diabetic chronic kidney disease: Secondary | ICD-10-CM | POA: Diagnosis not present

## 2023-10-04 DIAGNOSIS — I1310 Hypertensive heart and chronic kidney disease without heart failure, with stage 1 through stage 4 chronic kidney disease, or unspecified chronic kidney disease: Secondary | ICD-10-CM | POA: Diagnosis not present

## 2023-10-05 DIAGNOSIS — I5032 Chronic diastolic (congestive) heart failure: Secondary | ICD-10-CM | POA: Diagnosis not present

## 2023-10-05 DIAGNOSIS — Z466 Encounter for fitting and adjustment of urinary device: Secondary | ICD-10-CM | POA: Diagnosis not present

## 2023-10-05 DIAGNOSIS — E1151 Type 2 diabetes mellitus with diabetic peripheral angiopathy without gangrene: Secondary | ICD-10-CM | POA: Diagnosis not present

## 2023-10-05 DIAGNOSIS — D631 Anemia in chronic kidney disease: Secondary | ICD-10-CM | POA: Diagnosis not present

## 2023-10-05 DIAGNOSIS — I130 Hypertensive heart and chronic kidney disease with heart failure and stage 1 through stage 4 chronic kidney disease, or unspecified chronic kidney disease: Secondary | ICD-10-CM | POA: Diagnosis not present

## 2023-10-05 DIAGNOSIS — N184 Chronic kidney disease, stage 4 (severe): Secondary | ICD-10-CM | POA: Diagnosis not present

## 2023-10-05 DIAGNOSIS — I480 Paroxysmal atrial fibrillation: Secondary | ICD-10-CM | POA: Diagnosis not present

## 2023-10-05 DIAGNOSIS — E1122 Type 2 diabetes mellitus with diabetic chronic kidney disease: Secondary | ICD-10-CM | POA: Diagnosis not present

## 2023-10-06 ENCOUNTER — Other Ambulatory Visit: Payer: Self-pay | Admitting: Cardiology

## 2023-10-06 DIAGNOSIS — I130 Hypertensive heart and chronic kidney disease with heart failure and stage 1 through stage 4 chronic kidney disease, or unspecified chronic kidney disease: Secondary | ICD-10-CM | POA: Diagnosis not present

## 2023-10-06 DIAGNOSIS — N184 Chronic kidney disease, stage 4 (severe): Secondary | ICD-10-CM | POA: Diagnosis not present

## 2023-10-06 DIAGNOSIS — Z466 Encounter for fitting and adjustment of urinary device: Secondary | ICD-10-CM | POA: Diagnosis not present

## 2023-10-06 DIAGNOSIS — E1122 Type 2 diabetes mellitus with diabetic chronic kidney disease: Secondary | ICD-10-CM | POA: Diagnosis not present

## 2023-10-06 DIAGNOSIS — E1151 Type 2 diabetes mellitus with diabetic peripheral angiopathy without gangrene: Secondary | ICD-10-CM | POA: Diagnosis not present

## 2023-10-06 DIAGNOSIS — D631 Anemia in chronic kidney disease: Secondary | ICD-10-CM | POA: Diagnosis not present

## 2023-10-06 DIAGNOSIS — I480 Paroxysmal atrial fibrillation: Secondary | ICD-10-CM | POA: Diagnosis not present

## 2023-10-06 DIAGNOSIS — I5032 Chronic diastolic (congestive) heart failure: Secondary | ICD-10-CM | POA: Diagnosis not present

## 2023-10-06 NOTE — Telephone Encounter (Signed)
 Prescription refill request for Eliquis  received. Indication: PAF Last office visit: 08/05/23  R Revankar MD Scr: 0.90 on 09/14/23  Epic Age: 78 Weight: 49.9kg  Based on above findings Eliquis  5mg  twice daily is the appropriate dose.  Refill approved.

## 2023-10-07 DIAGNOSIS — I5032 Chronic diastolic (congestive) heart failure: Secondary | ICD-10-CM | POA: Diagnosis not present

## 2023-10-07 DIAGNOSIS — N184 Chronic kidney disease, stage 4 (severe): Secondary | ICD-10-CM | POA: Diagnosis not present

## 2023-10-07 DIAGNOSIS — Z466 Encounter for fitting and adjustment of urinary device: Secondary | ICD-10-CM | POA: Diagnosis not present

## 2023-10-07 DIAGNOSIS — D631 Anemia in chronic kidney disease: Secondary | ICD-10-CM | POA: Diagnosis not present

## 2023-10-07 DIAGNOSIS — I480 Paroxysmal atrial fibrillation: Secondary | ICD-10-CM | POA: Diagnosis not present

## 2023-10-07 DIAGNOSIS — E1122 Type 2 diabetes mellitus with diabetic chronic kidney disease: Secondary | ICD-10-CM | POA: Diagnosis not present

## 2023-10-07 DIAGNOSIS — E1151 Type 2 diabetes mellitus with diabetic peripheral angiopathy without gangrene: Secondary | ICD-10-CM | POA: Diagnosis not present

## 2023-10-07 DIAGNOSIS — I130 Hypertensive heart and chronic kidney disease with heart failure and stage 1 through stage 4 chronic kidney disease, or unspecified chronic kidney disease: Secondary | ICD-10-CM | POA: Diagnosis not present

## 2023-10-08 DIAGNOSIS — R070 Pain in throat: Secondary | ICD-10-CM | POA: Diagnosis not present

## 2023-10-11 DIAGNOSIS — N184 Chronic kidney disease, stage 4 (severe): Secondary | ICD-10-CM | POA: Diagnosis not present

## 2023-10-11 DIAGNOSIS — I480 Paroxysmal atrial fibrillation: Secondary | ICD-10-CM | POA: Diagnosis not present

## 2023-10-11 DIAGNOSIS — I130 Hypertensive heart and chronic kidney disease with heart failure and stage 1 through stage 4 chronic kidney disease, or unspecified chronic kidney disease: Secondary | ICD-10-CM | POA: Diagnosis not present

## 2023-10-11 DIAGNOSIS — I5032 Chronic diastolic (congestive) heart failure: Secondary | ICD-10-CM | POA: Diagnosis not present

## 2023-10-11 DIAGNOSIS — D631 Anemia in chronic kidney disease: Secondary | ICD-10-CM | POA: Diagnosis not present

## 2023-10-11 DIAGNOSIS — Z466 Encounter for fitting and adjustment of urinary device: Secondary | ICD-10-CM | POA: Diagnosis not present

## 2023-10-11 DIAGNOSIS — E1151 Type 2 diabetes mellitus with diabetic peripheral angiopathy without gangrene: Secondary | ICD-10-CM | POA: Diagnosis not present

## 2023-10-11 DIAGNOSIS — E1122 Type 2 diabetes mellitus with diabetic chronic kidney disease: Secondary | ICD-10-CM | POA: Diagnosis not present

## 2023-10-13 DIAGNOSIS — I480 Paroxysmal atrial fibrillation: Secondary | ICD-10-CM | POA: Diagnosis not present

## 2023-10-13 DIAGNOSIS — D631 Anemia in chronic kidney disease: Secondary | ICD-10-CM | POA: Diagnosis not present

## 2023-10-13 DIAGNOSIS — I130 Hypertensive heart and chronic kidney disease with heart failure and stage 1 through stage 4 chronic kidney disease, or unspecified chronic kidney disease: Secondary | ICD-10-CM | POA: Diagnosis not present

## 2023-10-13 DIAGNOSIS — E1122 Type 2 diabetes mellitus with diabetic chronic kidney disease: Secondary | ICD-10-CM | POA: Diagnosis not present

## 2023-10-13 DIAGNOSIS — Z466 Encounter for fitting and adjustment of urinary device: Secondary | ICD-10-CM | POA: Diagnosis not present

## 2023-10-13 DIAGNOSIS — N184 Chronic kidney disease, stage 4 (severe): Secondary | ICD-10-CM | POA: Diagnosis not present

## 2023-10-13 DIAGNOSIS — E1151 Type 2 diabetes mellitus with diabetic peripheral angiopathy without gangrene: Secondary | ICD-10-CM | POA: Diagnosis not present

## 2023-10-13 DIAGNOSIS — I5032 Chronic diastolic (congestive) heart failure: Secondary | ICD-10-CM | POA: Diagnosis not present

## 2023-10-18 DIAGNOSIS — E119 Type 2 diabetes mellitus without complications: Secondary | ICD-10-CM | POA: Diagnosis not present

## 2023-10-18 DIAGNOSIS — H40013 Open angle with borderline findings, low risk, bilateral: Secondary | ICD-10-CM | POA: Diagnosis not present

## 2023-10-18 DIAGNOSIS — H04123 Dry eye syndrome of bilateral lacrimal glands: Secondary | ICD-10-CM | POA: Diagnosis not present

## 2023-10-18 DIAGNOSIS — Z961 Presence of intraocular lens: Secondary | ICD-10-CM | POA: Diagnosis not present

## 2023-10-18 DIAGNOSIS — H43813 Vitreous degeneration, bilateral: Secondary | ICD-10-CM | POA: Diagnosis not present

## 2023-10-18 DIAGNOSIS — H1045 Other chronic allergic conjunctivitis: Secondary | ICD-10-CM | POA: Diagnosis not present

## 2023-10-19 DIAGNOSIS — N184 Chronic kidney disease, stage 4 (severe): Secondary | ICD-10-CM | POA: Diagnosis not present

## 2023-10-19 DIAGNOSIS — D631 Anemia in chronic kidney disease: Secondary | ICD-10-CM | POA: Diagnosis not present

## 2023-10-19 DIAGNOSIS — Z466 Encounter for fitting and adjustment of urinary device: Secondary | ICD-10-CM | POA: Diagnosis not present

## 2023-10-19 DIAGNOSIS — E1122 Type 2 diabetes mellitus with diabetic chronic kidney disease: Secondary | ICD-10-CM | POA: Diagnosis not present

## 2023-10-19 DIAGNOSIS — I130 Hypertensive heart and chronic kidney disease with heart failure and stage 1 through stage 4 chronic kidney disease, or unspecified chronic kidney disease: Secondary | ICD-10-CM | POA: Diagnosis not present

## 2023-10-19 DIAGNOSIS — I480 Paroxysmal atrial fibrillation: Secondary | ICD-10-CM | POA: Diagnosis not present

## 2023-10-19 DIAGNOSIS — E1151 Type 2 diabetes mellitus with diabetic peripheral angiopathy without gangrene: Secondary | ICD-10-CM | POA: Diagnosis not present

## 2023-10-19 DIAGNOSIS — I5032 Chronic diastolic (congestive) heart failure: Secondary | ICD-10-CM | POA: Diagnosis not present

## 2023-10-20 DIAGNOSIS — I130 Hypertensive heart and chronic kidney disease with heart failure and stage 1 through stage 4 chronic kidney disease, or unspecified chronic kidney disease: Secondary | ICD-10-CM | POA: Diagnosis not present

## 2023-10-20 DIAGNOSIS — I5032 Chronic diastolic (congestive) heart failure: Secondary | ICD-10-CM | POA: Diagnosis not present

## 2023-10-20 DIAGNOSIS — E1122 Type 2 diabetes mellitus with diabetic chronic kidney disease: Secondary | ICD-10-CM | POA: Diagnosis not present

## 2023-10-20 DIAGNOSIS — E1151 Type 2 diabetes mellitus with diabetic peripheral angiopathy without gangrene: Secondary | ICD-10-CM | POA: Diagnosis not present

## 2023-10-20 DIAGNOSIS — D631 Anemia in chronic kidney disease: Secondary | ICD-10-CM | POA: Diagnosis not present

## 2023-10-20 DIAGNOSIS — I480 Paroxysmal atrial fibrillation: Secondary | ICD-10-CM | POA: Diagnosis not present

## 2023-10-20 DIAGNOSIS — N184 Chronic kidney disease, stage 4 (severe): Secondary | ICD-10-CM | POA: Diagnosis not present

## 2023-10-20 DIAGNOSIS — Z466 Encounter for fitting and adjustment of urinary device: Secondary | ICD-10-CM | POA: Diagnosis not present

## 2023-10-21 DIAGNOSIS — E1122 Type 2 diabetes mellitus with diabetic chronic kidney disease: Secondary | ICD-10-CM | POA: Diagnosis not present

## 2023-10-21 DIAGNOSIS — I130 Hypertensive heart and chronic kidney disease with heart failure and stage 1 through stage 4 chronic kidney disease, or unspecified chronic kidney disease: Secondary | ICD-10-CM | POA: Diagnosis not present

## 2023-10-21 DIAGNOSIS — E1151 Type 2 diabetes mellitus with diabetic peripheral angiopathy without gangrene: Secondary | ICD-10-CM | POA: Diagnosis not present

## 2023-10-21 DIAGNOSIS — I5032 Chronic diastolic (congestive) heart failure: Secondary | ICD-10-CM | POA: Diagnosis not present

## 2023-10-21 DIAGNOSIS — I480 Paroxysmal atrial fibrillation: Secondary | ICD-10-CM | POA: Diagnosis not present

## 2023-10-21 DIAGNOSIS — N184 Chronic kidney disease, stage 4 (severe): Secondary | ICD-10-CM | POA: Diagnosis not present

## 2023-10-21 DIAGNOSIS — Z466 Encounter for fitting and adjustment of urinary device: Secondary | ICD-10-CM | POA: Diagnosis not present

## 2023-10-21 DIAGNOSIS — D631 Anemia in chronic kidney disease: Secondary | ICD-10-CM | POA: Diagnosis not present

## 2023-10-22 DIAGNOSIS — Z466 Encounter for fitting and adjustment of urinary device: Secondary | ICD-10-CM | POA: Diagnosis not present

## 2023-10-22 DIAGNOSIS — E1122 Type 2 diabetes mellitus with diabetic chronic kidney disease: Secondary | ICD-10-CM | POA: Diagnosis not present

## 2023-10-22 DIAGNOSIS — I5032 Chronic diastolic (congestive) heart failure: Secondary | ICD-10-CM | POA: Diagnosis not present

## 2023-10-22 DIAGNOSIS — I130 Hypertensive heart and chronic kidney disease with heart failure and stage 1 through stage 4 chronic kidney disease, or unspecified chronic kidney disease: Secondary | ICD-10-CM | POA: Diagnosis not present

## 2023-10-22 DIAGNOSIS — I480 Paroxysmal atrial fibrillation: Secondary | ICD-10-CM | POA: Diagnosis not present

## 2023-10-22 DIAGNOSIS — N184 Chronic kidney disease, stage 4 (severe): Secondary | ICD-10-CM | POA: Diagnosis not present

## 2023-10-22 DIAGNOSIS — E1151 Type 2 diabetes mellitus with diabetic peripheral angiopathy without gangrene: Secondary | ICD-10-CM | POA: Diagnosis not present

## 2023-10-22 DIAGNOSIS — D631 Anemia in chronic kidney disease: Secondary | ICD-10-CM | POA: Diagnosis not present

## 2023-10-27 DIAGNOSIS — Z466 Encounter for fitting and adjustment of urinary device: Secondary | ICD-10-CM | POA: Diagnosis not present

## 2023-10-27 DIAGNOSIS — E1151 Type 2 diabetes mellitus with diabetic peripheral angiopathy without gangrene: Secondary | ICD-10-CM | POA: Diagnosis not present

## 2023-10-27 DIAGNOSIS — D631 Anemia in chronic kidney disease: Secondary | ICD-10-CM | POA: Diagnosis not present

## 2023-10-27 DIAGNOSIS — I480 Paroxysmal atrial fibrillation: Secondary | ICD-10-CM | POA: Diagnosis not present

## 2023-10-27 DIAGNOSIS — E1122 Type 2 diabetes mellitus with diabetic chronic kidney disease: Secondary | ICD-10-CM | POA: Diagnosis not present

## 2023-10-27 DIAGNOSIS — I5032 Chronic diastolic (congestive) heart failure: Secondary | ICD-10-CM | POA: Diagnosis not present

## 2023-10-27 DIAGNOSIS — I130 Hypertensive heart and chronic kidney disease with heart failure and stage 1 through stage 4 chronic kidney disease, or unspecified chronic kidney disease: Secondary | ICD-10-CM | POA: Diagnosis not present

## 2023-10-27 DIAGNOSIS — N184 Chronic kidney disease, stage 4 (severe): Secondary | ICD-10-CM | POA: Diagnosis not present

## 2023-10-28 DIAGNOSIS — M79675 Pain in left toe(s): Secondary | ICD-10-CM | POA: Diagnosis not present

## 2023-10-28 DIAGNOSIS — G8929 Other chronic pain: Secondary | ICD-10-CM | POA: Diagnosis not present

## 2023-10-28 DIAGNOSIS — L602 Onychogryphosis: Secondary | ICD-10-CM | POA: Diagnosis not present

## 2023-10-28 DIAGNOSIS — M79674 Pain in right toe(s): Secondary | ICD-10-CM | POA: Diagnosis not present

## 2023-11-04 DIAGNOSIS — E1122 Type 2 diabetes mellitus with diabetic chronic kidney disease: Secondary | ICD-10-CM | POA: Diagnosis not present

## 2023-11-04 DIAGNOSIS — N184 Chronic kidney disease, stage 4 (severe): Secondary | ICD-10-CM | POA: Diagnosis not present

## 2023-11-04 DIAGNOSIS — D631 Anemia in chronic kidney disease: Secondary | ICD-10-CM | POA: Diagnosis not present

## 2023-11-04 DIAGNOSIS — G8929 Other chronic pain: Secondary | ICD-10-CM | POA: Diagnosis not present

## 2023-11-04 DIAGNOSIS — E1151 Type 2 diabetes mellitus with diabetic peripheral angiopathy without gangrene: Secondary | ICD-10-CM | POA: Diagnosis not present

## 2023-11-04 DIAGNOSIS — I5032 Chronic diastolic (congestive) heart failure: Secondary | ICD-10-CM | POA: Diagnosis not present

## 2023-11-04 DIAGNOSIS — I480 Paroxysmal atrial fibrillation: Secondary | ICD-10-CM | POA: Diagnosis not present

## 2023-11-04 DIAGNOSIS — I130 Hypertensive heart and chronic kidney disease with heart failure and stage 1 through stage 4 chronic kidney disease, or unspecified chronic kidney disease: Secondary | ICD-10-CM | POA: Diagnosis not present

## 2023-11-04 DIAGNOSIS — Z466 Encounter for fitting and adjustment of urinary device: Secondary | ICD-10-CM | POA: Diagnosis not present

## 2023-11-09 DIAGNOSIS — I5032 Chronic diastolic (congestive) heart failure: Secondary | ICD-10-CM | POA: Diagnosis not present

## 2023-11-09 DIAGNOSIS — D631 Anemia in chronic kidney disease: Secondary | ICD-10-CM | POA: Diagnosis not present

## 2023-11-09 DIAGNOSIS — I130 Hypertensive heart and chronic kidney disease with heart failure and stage 1 through stage 4 chronic kidney disease, or unspecified chronic kidney disease: Secondary | ICD-10-CM | POA: Diagnosis not present

## 2023-11-09 DIAGNOSIS — N184 Chronic kidney disease, stage 4 (severe): Secondary | ICD-10-CM | POA: Diagnosis not present

## 2023-11-09 DIAGNOSIS — I480 Paroxysmal atrial fibrillation: Secondary | ICD-10-CM | POA: Diagnosis not present

## 2023-11-09 DIAGNOSIS — E1122 Type 2 diabetes mellitus with diabetic chronic kidney disease: Secondary | ICD-10-CM | POA: Diagnosis not present

## 2023-11-09 DIAGNOSIS — Z466 Encounter for fitting and adjustment of urinary device: Secondary | ICD-10-CM | POA: Diagnosis not present

## 2023-11-09 DIAGNOSIS — E1151 Type 2 diabetes mellitus with diabetic peripheral angiopathy without gangrene: Secondary | ICD-10-CM | POA: Diagnosis not present

## 2023-11-16 DIAGNOSIS — E1122 Type 2 diabetes mellitus with diabetic chronic kidney disease: Secondary | ICD-10-CM | POA: Diagnosis not present

## 2023-11-16 DIAGNOSIS — I5032 Chronic diastolic (congestive) heart failure: Secondary | ICD-10-CM | POA: Diagnosis not present

## 2023-11-16 DIAGNOSIS — D631 Anemia in chronic kidney disease: Secondary | ICD-10-CM | POA: Diagnosis not present

## 2023-11-16 DIAGNOSIS — I130 Hypertensive heart and chronic kidney disease with heart failure and stage 1 through stage 4 chronic kidney disease, or unspecified chronic kidney disease: Secondary | ICD-10-CM | POA: Diagnosis not present

## 2023-11-16 DIAGNOSIS — E1151 Type 2 diabetes mellitus with diabetic peripheral angiopathy without gangrene: Secondary | ICD-10-CM | POA: Diagnosis not present

## 2023-11-16 DIAGNOSIS — I480 Paroxysmal atrial fibrillation: Secondary | ICD-10-CM | POA: Diagnosis not present

## 2023-11-16 DIAGNOSIS — N184 Chronic kidney disease, stage 4 (severe): Secondary | ICD-10-CM | POA: Diagnosis not present

## 2023-11-16 DIAGNOSIS — Z466 Encounter for fitting and adjustment of urinary device: Secondary | ICD-10-CM | POA: Diagnosis not present

## 2023-11-19 DIAGNOSIS — I130 Hypertensive heart and chronic kidney disease with heart failure and stage 1 through stage 4 chronic kidney disease, or unspecified chronic kidney disease: Secondary | ICD-10-CM | POA: Diagnosis not present

## 2023-11-19 DIAGNOSIS — E1151 Type 2 diabetes mellitus with diabetic peripheral angiopathy without gangrene: Secondary | ICD-10-CM | POA: Diagnosis not present

## 2023-11-19 DIAGNOSIS — Z466 Encounter for fitting and adjustment of urinary device: Secondary | ICD-10-CM | POA: Diagnosis not present

## 2023-11-19 DIAGNOSIS — E1122 Type 2 diabetes mellitus with diabetic chronic kidney disease: Secondary | ICD-10-CM | POA: Diagnosis not present

## 2023-11-19 DIAGNOSIS — D631 Anemia in chronic kidney disease: Secondary | ICD-10-CM | POA: Diagnosis not present

## 2023-11-19 DIAGNOSIS — I480 Paroxysmal atrial fibrillation: Secondary | ICD-10-CM | POA: Diagnosis not present

## 2023-11-19 DIAGNOSIS — I5032 Chronic diastolic (congestive) heart failure: Secondary | ICD-10-CM | POA: Diagnosis not present

## 2023-11-19 DIAGNOSIS — N184 Chronic kidney disease, stage 4 (severe): Secondary | ICD-10-CM | POA: Diagnosis not present

## 2023-11-21 DIAGNOSIS — I130 Hypertensive heart and chronic kidney disease with heart failure and stage 1 through stage 4 chronic kidney disease, or unspecified chronic kidney disease: Secondary | ICD-10-CM | POA: Diagnosis not present

## 2023-11-21 DIAGNOSIS — E1151 Type 2 diabetes mellitus with diabetic peripheral angiopathy without gangrene: Secondary | ICD-10-CM | POA: Diagnosis not present

## 2023-11-21 DIAGNOSIS — Z466 Encounter for fitting and adjustment of urinary device: Secondary | ICD-10-CM | POA: Diagnosis not present

## 2023-11-21 DIAGNOSIS — D631 Anemia in chronic kidney disease: Secondary | ICD-10-CM | POA: Diagnosis not present

## 2023-11-21 DIAGNOSIS — N184 Chronic kidney disease, stage 4 (severe): Secondary | ICD-10-CM | POA: Diagnosis not present

## 2023-11-21 DIAGNOSIS — I5032 Chronic diastolic (congestive) heart failure: Secondary | ICD-10-CM | POA: Diagnosis not present

## 2023-11-21 DIAGNOSIS — I480 Paroxysmal atrial fibrillation: Secondary | ICD-10-CM | POA: Diagnosis not present

## 2023-11-21 DIAGNOSIS — E1122 Type 2 diabetes mellitus with diabetic chronic kidney disease: Secondary | ICD-10-CM | POA: Diagnosis not present

## 2023-11-25 DIAGNOSIS — N184 Chronic kidney disease, stage 4 (severe): Secondary | ICD-10-CM | POA: Diagnosis not present

## 2023-11-25 DIAGNOSIS — E1122 Type 2 diabetes mellitus with diabetic chronic kidney disease: Secondary | ICD-10-CM | POA: Diagnosis not present

## 2023-11-25 DIAGNOSIS — E1151 Type 2 diabetes mellitus with diabetic peripheral angiopathy without gangrene: Secondary | ICD-10-CM | POA: Diagnosis not present

## 2023-11-25 DIAGNOSIS — D631 Anemia in chronic kidney disease: Secondary | ICD-10-CM | POA: Diagnosis not present

## 2023-11-25 DIAGNOSIS — I130 Hypertensive heart and chronic kidney disease with heart failure and stage 1 through stage 4 chronic kidney disease, or unspecified chronic kidney disease: Secondary | ICD-10-CM | POA: Diagnosis not present

## 2023-11-25 DIAGNOSIS — I480 Paroxysmal atrial fibrillation: Secondary | ICD-10-CM | POA: Diagnosis not present

## 2023-11-25 DIAGNOSIS — Z466 Encounter for fitting and adjustment of urinary device: Secondary | ICD-10-CM | POA: Diagnosis not present

## 2023-11-25 DIAGNOSIS — I5032 Chronic diastolic (congestive) heart failure: Secondary | ICD-10-CM | POA: Diagnosis not present

## 2023-12-01 DIAGNOSIS — E1151 Type 2 diabetes mellitus with diabetic peripheral angiopathy without gangrene: Secondary | ICD-10-CM | POA: Diagnosis not present

## 2023-12-01 DIAGNOSIS — E1122 Type 2 diabetes mellitus with diabetic chronic kidney disease: Secondary | ICD-10-CM | POA: Diagnosis not present

## 2023-12-01 DIAGNOSIS — I480 Paroxysmal atrial fibrillation: Secondary | ICD-10-CM | POA: Diagnosis not present

## 2023-12-01 DIAGNOSIS — I5032 Chronic diastolic (congestive) heart failure: Secondary | ICD-10-CM | POA: Diagnosis not present

## 2023-12-01 DIAGNOSIS — I130 Hypertensive heart and chronic kidney disease with heart failure and stage 1 through stage 4 chronic kidney disease, or unspecified chronic kidney disease: Secondary | ICD-10-CM | POA: Diagnosis not present

## 2023-12-01 DIAGNOSIS — Z466 Encounter for fitting and adjustment of urinary device: Secondary | ICD-10-CM | POA: Diagnosis not present

## 2023-12-01 DIAGNOSIS — D631 Anemia in chronic kidney disease: Secondary | ICD-10-CM | POA: Diagnosis not present

## 2023-12-01 DIAGNOSIS — N184 Chronic kidney disease, stage 4 (severe): Secondary | ICD-10-CM | POA: Diagnosis not present

## 2023-12-05 DIAGNOSIS — I5032 Chronic diastolic (congestive) heart failure: Secondary | ICD-10-CM | POA: Diagnosis not present

## 2023-12-05 DIAGNOSIS — E1122 Type 2 diabetes mellitus with diabetic chronic kidney disease: Secondary | ICD-10-CM | POA: Diagnosis not present

## 2023-12-09 DIAGNOSIS — Z466 Encounter for fitting and adjustment of urinary device: Secondary | ICD-10-CM | POA: Diagnosis not present

## 2023-12-09 DIAGNOSIS — I480 Paroxysmal atrial fibrillation: Secondary | ICD-10-CM | POA: Diagnosis not present

## 2023-12-09 DIAGNOSIS — D631 Anemia in chronic kidney disease: Secondary | ICD-10-CM | POA: Diagnosis not present

## 2023-12-09 DIAGNOSIS — N184 Chronic kidney disease, stage 4 (severe): Secondary | ICD-10-CM | POA: Diagnosis not present

## 2023-12-09 DIAGNOSIS — E1122 Type 2 diabetes mellitus with diabetic chronic kidney disease: Secondary | ICD-10-CM | POA: Diagnosis not present

## 2023-12-09 DIAGNOSIS — E1151 Type 2 diabetes mellitus with diabetic peripheral angiopathy without gangrene: Secondary | ICD-10-CM | POA: Diagnosis not present

## 2023-12-09 DIAGNOSIS — I5032 Chronic diastolic (congestive) heart failure: Secondary | ICD-10-CM | POA: Diagnosis not present

## 2023-12-09 DIAGNOSIS — I130 Hypertensive heart and chronic kidney disease with heart failure and stage 1 through stage 4 chronic kidney disease, or unspecified chronic kidney disease: Secondary | ICD-10-CM | POA: Diagnosis not present

## 2023-12-14 DIAGNOSIS — I480 Paroxysmal atrial fibrillation: Secondary | ICD-10-CM | POA: Diagnosis not present

## 2023-12-14 DIAGNOSIS — I5032 Chronic diastolic (congestive) heart failure: Secondary | ICD-10-CM | POA: Diagnosis not present

## 2023-12-14 DIAGNOSIS — N184 Chronic kidney disease, stage 4 (severe): Secondary | ICD-10-CM | POA: Diagnosis not present

## 2023-12-14 DIAGNOSIS — I130 Hypertensive heart and chronic kidney disease with heart failure and stage 1 through stage 4 chronic kidney disease, or unspecified chronic kidney disease: Secondary | ICD-10-CM | POA: Diagnosis not present

## 2023-12-14 DIAGNOSIS — Z466 Encounter for fitting and adjustment of urinary device: Secondary | ICD-10-CM | POA: Diagnosis not present

## 2023-12-14 DIAGNOSIS — E1122 Type 2 diabetes mellitus with diabetic chronic kidney disease: Secondary | ICD-10-CM | POA: Diagnosis not present

## 2023-12-14 DIAGNOSIS — D631 Anemia in chronic kidney disease: Secondary | ICD-10-CM | POA: Diagnosis not present

## 2023-12-14 DIAGNOSIS — E1151 Type 2 diabetes mellitus with diabetic peripheral angiopathy without gangrene: Secondary | ICD-10-CM | POA: Diagnosis not present

## 2023-12-16 DIAGNOSIS — E1151 Type 2 diabetes mellitus with diabetic peripheral angiopathy without gangrene: Secondary | ICD-10-CM | POA: Diagnosis not present

## 2023-12-16 DIAGNOSIS — Z466 Encounter for fitting and adjustment of urinary device: Secondary | ICD-10-CM | POA: Diagnosis not present

## 2023-12-16 DIAGNOSIS — N184 Chronic kidney disease, stage 4 (severe): Secondary | ICD-10-CM | POA: Diagnosis not present

## 2023-12-16 DIAGNOSIS — D631 Anemia in chronic kidney disease: Secondary | ICD-10-CM | POA: Diagnosis not present

## 2023-12-16 DIAGNOSIS — I5032 Chronic diastolic (congestive) heart failure: Secondary | ICD-10-CM | POA: Diagnosis not present

## 2023-12-16 DIAGNOSIS — I130 Hypertensive heart and chronic kidney disease with heart failure and stage 1 through stage 4 chronic kidney disease, or unspecified chronic kidney disease: Secondary | ICD-10-CM | POA: Diagnosis not present

## 2023-12-16 DIAGNOSIS — E1122 Type 2 diabetes mellitus with diabetic chronic kidney disease: Secondary | ICD-10-CM | POA: Diagnosis not present

## 2023-12-16 DIAGNOSIS — I480 Paroxysmal atrial fibrillation: Secondary | ICD-10-CM | POA: Diagnosis not present

## 2023-12-17 DIAGNOSIS — I2510 Atherosclerotic heart disease of native coronary artery without angina pectoris: Secondary | ICD-10-CM | POA: Diagnosis not present

## 2023-12-17 DIAGNOSIS — D638 Anemia in other chronic diseases classified elsewhere: Secondary | ICD-10-CM | POA: Diagnosis not present

## 2023-12-17 DIAGNOSIS — N183 Chronic kidney disease, stage 3 unspecified: Secondary | ICD-10-CM | POA: Diagnosis not present

## 2023-12-17 DIAGNOSIS — G8929 Other chronic pain: Secondary | ICD-10-CM | POA: Diagnosis not present

## 2023-12-17 DIAGNOSIS — M5441 Lumbago with sciatica, right side: Secondary | ICD-10-CM | POA: Diagnosis not present

## 2023-12-17 DIAGNOSIS — E785 Hyperlipidemia, unspecified: Secondary | ICD-10-CM | POA: Diagnosis not present

## 2023-12-17 DIAGNOSIS — M5442 Lumbago with sciatica, left side: Secondary | ICD-10-CM | POA: Diagnosis not present

## 2023-12-17 DIAGNOSIS — I1310 Hypertensive heart and chronic kidney disease without heart failure, with stage 1 through stage 4 chronic kidney disease, or unspecified chronic kidney disease: Secondary | ICD-10-CM | POA: Diagnosis not present

## 2023-12-17 DIAGNOSIS — I480 Paroxysmal atrial fibrillation: Secondary | ICD-10-CM | POA: Diagnosis not present

## 2023-12-17 DIAGNOSIS — E1122 Type 2 diabetes mellitus with diabetic chronic kidney disease: Secondary | ICD-10-CM | POA: Diagnosis not present

## 2023-12-21 DIAGNOSIS — Z466 Encounter for fitting and adjustment of urinary device: Secondary | ICD-10-CM | POA: Diagnosis not present

## 2023-12-21 DIAGNOSIS — E1151 Type 2 diabetes mellitus with diabetic peripheral angiopathy without gangrene: Secondary | ICD-10-CM | POA: Diagnosis not present

## 2023-12-21 DIAGNOSIS — I130 Hypertensive heart and chronic kidney disease with heart failure and stage 1 through stage 4 chronic kidney disease, or unspecified chronic kidney disease: Secondary | ICD-10-CM | POA: Diagnosis not present

## 2023-12-21 DIAGNOSIS — D631 Anemia in chronic kidney disease: Secondary | ICD-10-CM | POA: Diagnosis not present

## 2023-12-21 DIAGNOSIS — N184 Chronic kidney disease, stage 4 (severe): Secondary | ICD-10-CM | POA: Diagnosis not present

## 2023-12-21 DIAGNOSIS — I5032 Chronic diastolic (congestive) heart failure: Secondary | ICD-10-CM | POA: Diagnosis not present

## 2023-12-21 DIAGNOSIS — I480 Paroxysmal atrial fibrillation: Secondary | ICD-10-CM | POA: Diagnosis not present

## 2023-12-21 DIAGNOSIS — E1122 Type 2 diabetes mellitus with diabetic chronic kidney disease: Secondary | ICD-10-CM | POA: Diagnosis not present

## 2023-12-22 DIAGNOSIS — I5032 Chronic diastolic (congestive) heart failure: Secondary | ICD-10-CM | POA: Diagnosis not present

## 2023-12-22 DIAGNOSIS — I130 Hypertensive heart and chronic kidney disease with heart failure and stage 1 through stage 4 chronic kidney disease, or unspecified chronic kidney disease: Secondary | ICD-10-CM | POA: Diagnosis not present

## 2023-12-22 DIAGNOSIS — E1151 Type 2 diabetes mellitus with diabetic peripheral angiopathy without gangrene: Secondary | ICD-10-CM | POA: Diagnosis not present

## 2023-12-22 DIAGNOSIS — I480 Paroxysmal atrial fibrillation: Secondary | ICD-10-CM | POA: Diagnosis not present

## 2023-12-22 DIAGNOSIS — Z466 Encounter for fitting and adjustment of urinary device: Secondary | ICD-10-CM | POA: Diagnosis not present

## 2023-12-22 DIAGNOSIS — D631 Anemia in chronic kidney disease: Secondary | ICD-10-CM | POA: Diagnosis not present

## 2023-12-22 DIAGNOSIS — E1122 Type 2 diabetes mellitus with diabetic chronic kidney disease: Secondary | ICD-10-CM | POA: Diagnosis not present

## 2023-12-22 DIAGNOSIS — N184 Chronic kidney disease, stage 4 (severe): Secondary | ICD-10-CM | POA: Diagnosis not present

## 2023-12-24 DIAGNOSIS — N289 Disorder of kidney and ureter, unspecified: Secondary | ICD-10-CM | POA: Diagnosis not present

## 2023-12-24 DIAGNOSIS — D72829 Elevated white blood cell count, unspecified: Secondary | ICD-10-CM | POA: Diagnosis not present

## 2023-12-27 ENCOUNTER — Telehealth: Payer: Self-pay | Admitting: Pulmonary Disease

## 2023-12-27 NOTE — Telephone Encounter (Signed)
 Pt needs to be seen in an overdue follow up

## 2023-12-28 DIAGNOSIS — D631 Anemia in chronic kidney disease: Secondary | ICD-10-CM | POA: Diagnosis not present

## 2023-12-28 DIAGNOSIS — I480 Paroxysmal atrial fibrillation: Secondary | ICD-10-CM | POA: Diagnosis not present

## 2023-12-28 DIAGNOSIS — E1151 Type 2 diabetes mellitus with diabetic peripheral angiopathy without gangrene: Secondary | ICD-10-CM | POA: Diagnosis not present

## 2023-12-28 DIAGNOSIS — Z466 Encounter for fitting and adjustment of urinary device: Secondary | ICD-10-CM | POA: Diagnosis not present

## 2023-12-28 DIAGNOSIS — E1122 Type 2 diabetes mellitus with diabetic chronic kidney disease: Secondary | ICD-10-CM | POA: Diagnosis not present

## 2023-12-28 DIAGNOSIS — I130 Hypertensive heart and chronic kidney disease with heart failure and stage 1 through stage 4 chronic kidney disease, or unspecified chronic kidney disease: Secondary | ICD-10-CM | POA: Diagnosis not present

## 2023-12-28 DIAGNOSIS — N184 Chronic kidney disease, stage 4 (severe): Secondary | ICD-10-CM | POA: Diagnosis not present

## 2023-12-28 DIAGNOSIS — I5032 Chronic diastolic (congestive) heart failure: Secondary | ICD-10-CM | POA: Diagnosis not present

## 2023-12-28 NOTE — Telephone Encounter (Signed)
 PT has a APT scheduled [facility]

## 2024-01-04 DIAGNOSIS — I5032 Chronic diastolic (congestive) heart failure: Secondary | ICD-10-CM | POA: Diagnosis not present

## 2024-01-04 DIAGNOSIS — E1122 Type 2 diabetes mellitus with diabetic chronic kidney disease: Secondary | ICD-10-CM | POA: Diagnosis not present

## 2024-01-07 DIAGNOSIS — E1122 Type 2 diabetes mellitus with diabetic chronic kidney disease: Secondary | ICD-10-CM | POA: Diagnosis not present

## 2024-01-07 DIAGNOSIS — I5032 Chronic diastolic (congestive) heart failure: Secondary | ICD-10-CM | POA: Diagnosis not present

## 2024-01-07 DIAGNOSIS — I130 Hypertensive heart and chronic kidney disease with heart failure and stage 1 through stage 4 chronic kidney disease, or unspecified chronic kidney disease: Secondary | ICD-10-CM | POA: Diagnosis not present

## 2024-01-07 DIAGNOSIS — D631 Anemia in chronic kidney disease: Secondary | ICD-10-CM | POA: Diagnosis not present

## 2024-01-07 DIAGNOSIS — Z466 Encounter for fitting and adjustment of urinary device: Secondary | ICD-10-CM | POA: Diagnosis not present

## 2024-01-07 DIAGNOSIS — I480 Paroxysmal atrial fibrillation: Secondary | ICD-10-CM | POA: Diagnosis not present

## 2024-01-07 DIAGNOSIS — E1151 Type 2 diabetes mellitus with diabetic peripheral angiopathy without gangrene: Secondary | ICD-10-CM | POA: Diagnosis not present

## 2024-01-07 DIAGNOSIS — N184 Chronic kidney disease, stage 4 (severe): Secondary | ICD-10-CM | POA: Diagnosis not present

## 2024-01-18 DIAGNOSIS — E1122 Type 2 diabetes mellitus with diabetic chronic kidney disease: Secondary | ICD-10-CM | POA: Diagnosis not present

## 2024-01-20 DIAGNOSIS — D631 Anemia in chronic kidney disease: Secondary | ICD-10-CM | POA: Diagnosis not present

## 2024-01-20 DIAGNOSIS — N184 Chronic kidney disease, stage 4 (severe): Secondary | ICD-10-CM | POA: Diagnosis not present

## 2024-01-20 DIAGNOSIS — I130 Hypertensive heart and chronic kidney disease with heart failure and stage 1 through stage 4 chronic kidney disease, or unspecified chronic kidney disease: Secondary | ICD-10-CM | POA: Diagnosis not present

## 2024-01-20 DIAGNOSIS — Z466 Encounter for fitting and adjustment of urinary device: Secondary | ICD-10-CM | POA: Diagnosis not present

## 2024-01-20 DIAGNOSIS — E1151 Type 2 diabetes mellitus with diabetic peripheral angiopathy without gangrene: Secondary | ICD-10-CM | POA: Diagnosis not present

## 2024-01-20 DIAGNOSIS — I5032 Chronic diastolic (congestive) heart failure: Secondary | ICD-10-CM | POA: Diagnosis not present

## 2024-01-20 DIAGNOSIS — I480 Paroxysmal atrial fibrillation: Secondary | ICD-10-CM | POA: Diagnosis not present

## 2024-01-20 DIAGNOSIS — E1122 Type 2 diabetes mellitus with diabetic chronic kidney disease: Secondary | ICD-10-CM | POA: Diagnosis not present

## 2024-02-04 DIAGNOSIS — I1310 Hypertensive heart and chronic kidney disease without heart failure, with stage 1 through stage 4 chronic kidney disease, or unspecified chronic kidney disease: Secondary | ICD-10-CM | POA: Diagnosis not present

## 2024-02-04 DIAGNOSIS — I5032 Chronic diastolic (congestive) heart failure: Secondary | ICD-10-CM | POA: Diagnosis not present

## 2024-02-04 DIAGNOSIS — E1122 Type 2 diabetes mellitus with diabetic chronic kidney disease: Secondary | ICD-10-CM | POA: Diagnosis not present

## 2024-02-04 DIAGNOSIS — G8929 Other chronic pain: Secondary | ICD-10-CM | POA: Diagnosis not present

## 2024-02-10 DIAGNOSIS — D631 Anemia in chronic kidney disease: Secondary | ICD-10-CM | POA: Diagnosis not present

## 2024-02-10 DIAGNOSIS — E1151 Type 2 diabetes mellitus with diabetic peripheral angiopathy without gangrene: Secondary | ICD-10-CM | POA: Diagnosis not present

## 2024-02-10 DIAGNOSIS — E1122 Type 2 diabetes mellitus with diabetic chronic kidney disease: Secondary | ICD-10-CM | POA: Diagnosis not present

## 2024-02-10 DIAGNOSIS — Z466 Encounter for fitting and adjustment of urinary device: Secondary | ICD-10-CM | POA: Diagnosis not present

## 2024-02-10 DIAGNOSIS — I130 Hypertensive heart and chronic kidney disease with heart failure and stage 1 through stage 4 chronic kidney disease, or unspecified chronic kidney disease: Secondary | ICD-10-CM | POA: Diagnosis not present

## 2024-02-10 DIAGNOSIS — I5032 Chronic diastolic (congestive) heart failure: Secondary | ICD-10-CM | POA: Diagnosis not present

## 2024-02-10 DIAGNOSIS — I480 Paroxysmal atrial fibrillation: Secondary | ICD-10-CM | POA: Diagnosis not present

## 2024-02-10 DIAGNOSIS — N184 Chronic kidney disease, stage 4 (severe): Secondary | ICD-10-CM | POA: Diagnosis not present

## 2024-02-19 DIAGNOSIS — I130 Hypertensive heart and chronic kidney disease with heart failure and stage 1 through stage 4 chronic kidney disease, or unspecified chronic kidney disease: Secondary | ICD-10-CM | POA: Diagnosis not present

## 2024-02-19 DIAGNOSIS — I480 Paroxysmal atrial fibrillation: Secondary | ICD-10-CM | POA: Diagnosis not present

## 2024-02-19 DIAGNOSIS — N184 Chronic kidney disease, stage 4 (severe): Secondary | ICD-10-CM | POA: Diagnosis not present

## 2024-02-19 DIAGNOSIS — D631 Anemia in chronic kidney disease: Secondary | ICD-10-CM | POA: Diagnosis not present

## 2024-02-19 DIAGNOSIS — Z466 Encounter for fitting and adjustment of urinary device: Secondary | ICD-10-CM | POA: Diagnosis not present

## 2024-02-19 DIAGNOSIS — E1122 Type 2 diabetes mellitus with diabetic chronic kidney disease: Secondary | ICD-10-CM | POA: Diagnosis not present

## 2024-02-19 DIAGNOSIS — I5032 Chronic diastolic (congestive) heart failure: Secondary | ICD-10-CM | POA: Diagnosis not present

## 2024-02-19 DIAGNOSIS — E1151 Type 2 diabetes mellitus with diabetic peripheral angiopathy without gangrene: Secondary | ICD-10-CM | POA: Diagnosis not present

## 2024-02-22 ENCOUNTER — Ambulatory Visit: Admitting: Pulmonary Disease

## 2024-02-22 NOTE — Progress Notes (Deleted)
   Established Patient Pulmonology Office Visit   Subjective:  Patient ID: Lisa Maxwell, female    DOB: 1945-07-30  MRN: [id number]  CC: No chief complaint on file.   Discussed the use of AI scribe software for clinical note transcription with the patient, who gave verbal consent to proceed.  History of Present Illness       {PULM QUESTIONNAIRES (Optional):33196}  ROS  {History (Optional):23778}  Current Outpatient Medications:    albuterol  (VENTOLIN  HFA) 108 (90 Base) MCG/ACT inhaler, Inhale into the lungs every 6 (six) hours as needed for wheezing or shortness of breath., Disp: , Rfl:    apixaban  (ELIQUIS ) 5 MG TABS tablet, TAKE 1 TABLET BY MOUTH TWICE A DAY, Disp: 60 tablet, Rfl: 5   bethanechol  (URECHOLINE ) 50 MG tablet, Take 50 mg by mouth daily., Disp: , Rfl:    docusate sodium  (COLACE) 100 MG capsule, Take 100 mg by mouth 2 (two) times daily as needed for mild constipation or moderate constipation., Disp: , Rfl:    fluticasone  furoate-vilanterol (BREO ELLIPTA ) 100-25 MCG/ACT AEPB, Inhale 1-2 puffs into the lungs daily., Disp: , Rfl:    furosemide  (LASIX ) 40 MG tablet, Take 40 mg by mouth daily., Disp: , Rfl:    gabapentin  (NEURONTIN ) 600 MG tablet, Take 600 mg by mouth daily., Disp: , Rfl:    leptospermum manuka honey (MEDIHONEY) PSTE paste, Apply 1 Application topically daily., Disp: , Rfl:    lidocaine  (LIDODERM ) 5 %, Place 1 patch onto the skin daily. Remove & Discard patch within 12 hours or as directed by MD, Disp: 30 patch, Rfl: 0   methocarbamol  (ROBAXIN ) 500 MG tablet, Take 250 mg by mouth 3 (three) times daily., Disp: , Rfl:    Multiple Vitamins-Minerals (CENTRUM SILVER 50+WOMEN) TABS, Take 1 tablet by mouth daily with breakfast., Disp: , Rfl:    nitroGLYCERIN  (NITROSTAT ) 0.4 MG SL tablet, Place 0.4 mg under the tongue every 5 (five) minutes as needed for chest pain., Disp: , Rfl:    nystatin  cream (MYCOSTATIN ), Apply 1 Application topically 2 (two) times daily.,  Disp: 30 g, Rfl: 0   Oxycodone  HCl 10 MG TABS, Take 10 mg by mouth every 6 (six) hours as needed (pain)., Disp: , Rfl:    pantoprazole  (PROTONIX ) 40 MG tablet, TAKE 1 TABLET BY MOUTH EVERY DAY, Disp: 90 tablet, Rfl: 4   potassium chloride SA (KLOR-CON M) 20 MEQ tablet, Take 20 mEq by mouth daily., Disp: , Rfl:    pravastatin  (PRAVACHOL ) 20 MG tablet, Take 1 tablet (20 mg total) by mouth daily at 6 PM., Disp: 90 tablet, Rfl: 3   RYBELSUS  3 MG TABS, Take 1 tablet by mouth daily., Disp: , Rfl:    spironolactone  (ALDACTONE ) 25 MG tablet, Take 0.5 tablets (12.5 mg total) by mouth daily., Disp: 45 tablet, Rfl: 3   triamcinolone  ointment (KENALOG ) 0.5 %, Apply 1 Application topically at bedtime., Disp: , Rfl:       Objective:  There were no vitals taken for this visit.  {Pulm Vitals (Optional):32837}  Physical Exam   Diagnostic Review:  {Labs (Optional):32838}     Assessment & Plan:   Assessment & Plan   Assessment and Plan Assessment & Plan       No follow-ups on file.   Dorn KATHEE Chill, MD

## 2024-02-24 ENCOUNTER — Ambulatory Visit: Admitting: Pulmonary Disease

## 2024-02-24 ENCOUNTER — Encounter: Payer: Self-pay | Admitting: Pulmonary Disease

## 2024-02-24 VITALS — BP 94/50 | HR 76 | Temp 97.4°F | Ht <= 58 in | Wt 96.6 lb

## 2024-02-24 DIAGNOSIS — Z23 Encounter for immunization: Secondary | ICD-10-CM

## 2024-02-24 DIAGNOSIS — F17210 Nicotine dependence, cigarettes, uncomplicated: Secondary | ICD-10-CM | POA: Diagnosis not present

## 2024-02-24 DIAGNOSIS — J4531 Mild persistent asthma with (acute) exacerbation: Secondary | ICD-10-CM | POA: Diagnosis not present

## 2024-02-24 DIAGNOSIS — Z86711 Personal history of pulmonary embolism: Secondary | ICD-10-CM | POA: Diagnosis not present

## 2024-02-24 MED ORDER — ALBUTEROL SULFATE HFA 108 (90 BASE) MCG/ACT IN AERS: 1.0000 | INHALATION_SPRAY | Freq: Four times a day (QID) | RESPIRATORY_TRACT | 11 refills | Status: AC | PRN

## 2024-02-24 MED ORDER — FLUTICASONE FUROATE-VILANTEROL 100-25 MCG/ACT IN AEPB
1.0000 | INHALATION_SPRAY | Freq: Every day | RESPIRATORY_TRACT | 11 refills | Status: AC
Start: 2024-02-24 — End: ?

## 2024-02-24 NOTE — Assessment & Plan Note (Addendum)
 SABRA

## 2024-02-24 NOTE — Progress Notes (Signed)
 Established Patient Pulmonology Office Visit   Subjective:  Patient ID: Lisa Maxwell, female    DOB: [birth year]  MRN: [id number]  CC:  Chief Complaint  Patient presents with   Asthma    Follow up-overdue    Discussed the use of AI scribe software for clinical note transcription with the patient, who gave verbal consent to proceed.  History of Present Illness Lisa Maxwell is a 78 year old female with pulmonary emboli and asthma who returns for follow-up.  She smokes seven cigarettes daily and is on Eliquis  twice daily. She uses a Breo inhaler once daily, typically at night, and an albuterol  inhaler occasionally. She has COPD and CKD. Over the past year and a half, she has had multiple hospitalizations and stays in nursing homes.        ROS    Current Outpatient Medications:    apixaban  (ELIQUIS ) 5 MG TABS tablet, TAKE 1 TABLET BY MOUTH TWICE A DAY, Disp: 60 tablet, Rfl: 5   aspirin  EC 81 MG tablet, Take 81 mg by mouth daily. Swallow whole., Disp: , Rfl:    bethanechol  (URECHOLINE ) 50 MG tablet, Take 50 mg by mouth daily., Disp: , Rfl:    Coenzyme Q10 200 MG capsule, Take 200 mg by mouth daily., Disp: , Rfl:    docusate sodium  (COLACE) 100 MG capsule, Take 100 mg by mouth 2 (two) times daily as needed for mild constipation or moderate constipation., Disp: , Rfl:    ferrous sulfate  325 (65 FE) MG EC tablet, Take 325 mg by mouth daily with breakfast., Disp: , Rfl:    furosemide  (LASIX ) 40 MG tablet, Take 40 mg by mouth daily., Disp: , Rfl:    gabapentin  (NEURONTIN ) 600 MG tablet, Take 600 mg by mouth daily., Disp: , Rfl:    leptospermum manuka honey (MEDIHONEY) PSTE paste, Apply 1 Application topically daily., Disp: , Rfl:    lidocaine  (LIDODERM ) 5 %, Place 1 patch onto the skin daily. Remove & Discard patch within 12 hours or as directed by MD, Disp: 30 patch, Rfl: 0   loratadine (CLARITIN) 10 MG tablet, Take 10 mg by mouth daily., Disp: , Rfl:    lovastatin (MEVACOR) 40 MG  tablet, Take 40 mg by mouth at bedtime., Disp: , Rfl:    methocarbamol  (ROBAXIN ) 500 MG tablet, Take 250 mg by mouth 3 (three) times daily., Disp: , Rfl:    Multiple Vitamins-Minerals (CENTRUM SILVER 50+WOMEN) TABS, Take 1 tablet by mouth daily with breakfast., Disp: , Rfl:    nitroGLYCERIN  (NITROSTAT ) 0.4 MG SL tablet, Place 0.4 mg under the tongue every 5 (five) minutes as needed for chest pain., Disp: , Rfl:    nystatin  cream (MYCOSTATIN ), Apply 1 Application topically 2 (two) times daily., Disp: 30 g, Rfl: 0   Oxycodone  HCl 10 MG TABS, Take 10 mg by mouth every 6 (six) hours as needed (pain)., Disp: , Rfl:    pantoprazole  (PROTONIX ) 20 MG tablet, Take 20 mg by mouth daily., Disp: , Rfl:    pantoprazole  (PROTONIX ) 40 MG tablet, TAKE 1 TABLET BY MOUTH EVERY DAY, Disp: 90 tablet, Rfl: 4   potassium chloride SA (KLOR-CON M) 20 MEQ tablet, Take 20 mEq by mouth daily., Disp: , Rfl:    RYBELSUS  3 MG TABS, Take 1 tablet by mouth daily., Disp: , Rfl:    spironolactone  (ALDACTONE ) 25 MG tablet, Take 0.5 tablets (12.5 mg total) by mouth daily., Disp: 45 tablet, Rfl: 3   tamsulosin  (FLOMAX ) 0.4 MG  CAPS capsule, Take 0.4 mg by mouth in the morning and at bedtime., Disp: , Rfl:    triamcinolone  ointment (KENALOG ) 0.5 %, Apply 1 Application topically at bedtime., Disp: , Rfl:    Turmeric (QC TUMERIC COMPLEX PO), Take by mouth., Disp: , Rfl:    albuterol  (VENTOLIN  HFA) 108 (90 Base) MCG/ACT inhaler, Inhale 1-2 puffs into the lungs every 6 (six) hours as needed for wheezing or shortness of breath., Disp: 8 g, Rfl: 11   fluticasone  furoate-vilanterol (BREO ELLIPTA ) 100-25 MCG/ACT AEPB, Inhale 1 puff into the lungs daily., Disp: 28 each, Rfl: 11      Objective:  BP (!) 94/50   Pulse 76   Temp (!) 97.4 F (36.3 C) (Oral)   Ht 4' 8 (1.422 m)   Wt 96 lb 9.6 oz (43.8 kg)   SpO2 96%   BMI 21.66 kg/m     Physical Exam Constitutional:      General: She is not in acute distress.    Appearance: Normal  appearance.  Eyes:     General: No scleral icterus.    Conjunctiva/sclera: Conjunctivae normal.  Cardiovascular:     Rate and Rhythm: Normal rate and regular rhythm.  Pulmonary:     Breath sounds: No wheezing, rhonchi or rales.  Musculoskeletal:     Right lower leg: No edema.     Left lower leg: No edema.  Skin:    General: Skin is warm and dry.  Neurological:     General: No focal deficit present.      Diagnostic Review:       Assessment & Plan:   Assessment & Plan Mild persistent asthma with acute exacerbation  Orders:   albuterol  (VENTOLIN  HFA) 108 (90 Base) MCG/ACT inhaler; Inhale 1-2 puffs into the lungs every 6 (six) hours as needed for wheezing or shortness of breath.   fluticasone  furoate-vilanterol (BREO ELLIPTA ) 100-25 MCG/ACT AEPB; Inhale 1 puff into the lungs daily.  History of pulmonary embolism     Cigarette smoker      Assessment and Plan Assessment & Plan Asthma with mild persistent symptoms and chronic obstructive pulmonary disease (COPD) Asthma and COPD well-controlled with Breo inhaler. Occasional albuterol  use indicates good control. - Continue Breo inhaler once daily. - Prescribed albuterol  inhaler for rescue use. - Scheduled follow-up in six months.  History of pulmonary embolism Continues on Eliquis . - Continue Eliquis  twice daily.  Nicotine dependence, cigarettes Currently smoking seven cigarettes per day. Willing to reduce smoking. - Encouraged reduction in cigarette smoking.       Return in about 6 months (around 08/23/2024) for f/u visit Dr. Kara.   Dorn KATHEE Kara, MD

## 2024-02-24 NOTE — Patient Instructions (Signed)
Continue breo ellipta 1 puff daily - rinse mouth out after each use  Use albuterol inhaler 1-2 puffs every 4-6 hours as needed  Continue eliquis  twice daily   Follow up in 6 months

## 2024-02-25 ENCOUNTER — Ambulatory Visit: Admitting: Pulmonary Disease

## 2024-03-09 DIAGNOSIS — I5032 Chronic diastolic (congestive) heart failure: Secondary | ICD-10-CM | POA: Diagnosis not present

## 2024-03-09 DIAGNOSIS — I480 Paroxysmal atrial fibrillation: Secondary | ICD-10-CM | POA: Diagnosis not present

## 2024-03-09 DIAGNOSIS — E1151 Type 2 diabetes mellitus with diabetic peripheral angiopathy without gangrene: Secondary | ICD-10-CM | POA: Diagnosis not present

## 2024-03-09 DIAGNOSIS — E1122 Type 2 diabetes mellitus with diabetic chronic kidney disease: Secondary | ICD-10-CM | POA: Diagnosis not present

## 2024-03-09 DIAGNOSIS — D631 Anemia in chronic kidney disease: Secondary | ICD-10-CM | POA: Diagnosis not present

## 2024-03-09 DIAGNOSIS — I130 Hypertensive heart and chronic kidney disease with heart failure and stage 1 through stage 4 chronic kidney disease, or unspecified chronic kidney disease: Secondary | ICD-10-CM | POA: Diagnosis not present

## 2024-03-09 DIAGNOSIS — Z466 Encounter for fitting and adjustment of urinary device: Secondary | ICD-10-CM | POA: Diagnosis not present

## 2024-03-09 DIAGNOSIS — N184 Chronic kidney disease, stage 4 (severe): Secondary | ICD-10-CM | POA: Diagnosis not present

## 2024-03-16 DIAGNOSIS — I480 Paroxysmal atrial fibrillation: Secondary | ICD-10-CM | POA: Diagnosis not present

## 2024-03-16 DIAGNOSIS — D631 Anemia in chronic kidney disease: Secondary | ICD-10-CM | POA: Diagnosis not present

## 2024-03-16 DIAGNOSIS — E1122 Type 2 diabetes mellitus with diabetic chronic kidney disease: Secondary | ICD-10-CM | POA: Diagnosis not present

## 2024-03-16 DIAGNOSIS — N184 Chronic kidney disease, stage 4 (severe): Secondary | ICD-10-CM | POA: Diagnosis not present

## 2024-03-16 DIAGNOSIS — Z466 Encounter for fitting and adjustment of urinary device: Secondary | ICD-10-CM | POA: Diagnosis not present

## 2024-03-16 DIAGNOSIS — E1151 Type 2 diabetes mellitus with diabetic peripheral angiopathy without gangrene: Secondary | ICD-10-CM | POA: Diagnosis not present

## 2024-03-16 DIAGNOSIS — I5032 Chronic diastolic (congestive) heart failure: Secondary | ICD-10-CM | POA: Diagnosis not present

## 2024-03-16 DIAGNOSIS — I130 Hypertensive heart and chronic kidney disease with heart failure and stage 1 through stage 4 chronic kidney disease, or unspecified chronic kidney disease: Secondary | ICD-10-CM | POA: Diagnosis not present

## 2024-04-23 ENCOUNTER — Other Ambulatory Visit: Payer: Self-pay | Admitting: Cardiology

## 2024-04-23 DIAGNOSIS — I480 Paroxysmal atrial fibrillation: Secondary | ICD-10-CM

## 2024-05-03 ENCOUNTER — Ambulatory Visit: Admitting: Cardiology

## 2024-05-10 ENCOUNTER — Encounter: Payer: Self-pay | Admitting: Cardiology

## 2024-05-10 ENCOUNTER — Ambulatory Visit: Admitting: Cardiology

## 2024-05-10 VITALS — BP 110/56 | HR 69 | Ht <= 58 in | Wt 101.2 lb

## 2024-05-10 DIAGNOSIS — G4733 Obstructive sleep apnea (adult) (pediatric): Secondary | ICD-10-CM | POA: Diagnosis not present

## 2024-05-10 DIAGNOSIS — N189 Chronic kidney disease, unspecified: Secondary | ICD-10-CM | POA: Diagnosis not present

## 2024-05-10 DIAGNOSIS — E782 Mixed hyperlipidemia: Secondary | ICD-10-CM | POA: Diagnosis not present

## 2024-05-10 DIAGNOSIS — I4821 Permanent atrial fibrillation: Secondary | ICD-10-CM | POA: Diagnosis not present

## 2024-05-10 DIAGNOSIS — I2510 Atherosclerotic heart disease of native coronary artery without angina pectoris: Secondary | ICD-10-CM | POA: Diagnosis not present

## 2024-05-10 DIAGNOSIS — J431 Panlobular emphysema: Secondary | ICD-10-CM | POA: Diagnosis not present

## 2024-05-10 DIAGNOSIS — E088 Diabetes mellitus due to underlying condition with unspecified complications: Secondary | ICD-10-CM

## 2024-05-10 DIAGNOSIS — I10 Essential (primary) hypertension: Secondary | ICD-10-CM | POA: Diagnosis not present

## 2024-05-10 NOTE — Patient Instructions (Signed)

## 2024-05-10 NOTE — Progress Notes (Signed)
 " Cardiology Office Note:    Date:  05/10/2024   ID:  Lisa Maxwell, Lisa Maxwell 1946-03-04, MRN [id number]  PCP:  Keren Vicenta BRAVO, MD  Cardiologist:  Jennifer JONELLE Crape, MD   Referring MD: Keren Vicenta BRAVO, MD    ASSESSMENT:    1. Permanent atrial fibrillation (HCC)   2. Mixed hyperlipidemia   3. Coronary artery disease involving native coronary artery of native heart without angina pectoris   4. Primary hypertension   5. Panlobular emphysema (HCC)   6. OSA (obstructive sleep apnea)   7. Diabetes mellitus due to underlying condition with unspecified complications (HCC)   8. Chronic kidney disease, unspecified CKD stage   9. Mixed dyslipidemia    PLAN:    In order of problems listed above:  Coronary artery disease: Secondary prevention stressed with the patient.  Importance of compliance with diet medication stressed and patient verbalized standing. Permanent atrial fibrillation:I discussed with the patient atrial fibrillation, disease process. Management and therapy including rate and rhythm control, anticoagulation benefits and potential risks were discussed extensively with the patient. Patient had multiple questions which were answered to patient's satisfaction. Essential hypertension: Blood pressure is stable and diet was emphasized. Mixed dyslipidemia: On lipid-lowering medications followed by primary care.  Lipids were reviewed and discussed with the patient at length. Sleep apnea: Sleep health issues were discussed. Patient will be seen in follow-up appointment in 9 months or earlier if the patient has any concerns.    Medication Adjustments/Labs and Tests Ordered: Current medicines are reviewed at length with the patient today.  Concerns regarding medicines are outlined above.  Orders Placed This Encounter  Procedures   EKG 12-Lead   No orders of the defined types were placed in this encounter.    No chief complaint on file.    History of Present Illness:    Lisa Maxwell is a 79 y.o. female.  Patient has past medical history of permanent atrial fibrillation, essential hypertension, coronary artery disease, mixed dyslipidemia and sleep apnea.  She has diabetes mellitus.  She is brought in in wheelchair.  She has severe issues with orthopedic problems with her spine.  She has a renal mass and that has not been evaluated well because of general comorbidities.  She and her husband are aware of this.  They have talked with her primary care about these issues.  At the time of my evaluation, the patient is alert awake oriented and in no distress.  She denies any hematuria or any blood loss from any part of the body.  Past Medical History:  Diagnosis Date   Abnormal barium swallow 06/17/2022   Anemia, chronic disease 03/29/2023   Anorexia 06/17/2022   Atrial fibrillation (HCC) 04/30/2015   Atrial fibrillation, chronic (HCC) 09/28/2018   CAD (coronary artery disease) 11/04/2016   Cancer (HCC)    skin cancer   Cauda equina compression (HCC) 08/28/2021   Cerebrovascular disease    Chronic atrial fibrillation (HCC) 09/28/2018   Chronic bilateral low back pain without sciatica 03/08/2018   Chronic CHF (congestive heart failure) (HCC) 03/22/2023   Chronic kidney disease    Chronic obstructive lung disease (HCC) 09/28/2018   Chronic pain syndrome 03/08/2018   Chronic renal failure 09/28/2018   Chronic, continuous use of opioids 03/04/2021   Last Assessment & Plan:  Formatting of this note might be different from the original. Patient and I have discussed the hazardous effects of continued opiate pain medication usage. Risks and benefits of above medications  including but not limited to possibility of respiratory depression, sedation, and even death were discussed with the patient who expressed an understanding.  Patient did not displ   CKD (chronic kidney disease) stage 3, GFR 30-59 ml/min (HCC)    CKD stage 3b, GFR 30-44 ml/min (HCC)    Class 1 obesity due to  excess calories with serious comorbidity and body mass index (BMI) of 34.0 to 34.9 in adult 05/20/2021   Last Assessment & Plan: Formatting of this note might be different from the original. Patient educated about the detrimental effects of weight as it specifically pertains to pain management and overall health.  Patient's BMI 34.08 Encouraged healthy eating habits and routine low-impact cardiovascular exercises as tolerated.   Comprehensive diabetic foot examination, type 2 DM, encounter for [facility]) 04/29/2021   COPD (chronic obstructive pulmonary disease) (HCC) 09/28/2018   DDD (degenerative disc disease), lumbar 11/17/2018   Last Assessment & Plan:  Formatting of this note might be different from the original. See spinal stenosis plan   Decubitus skin ulcer 11/21/2021   Delirium 03/28/2023   Diabetes (HCC)    Diabetes mellitus due to underlying condition with unspecified complications (HCC) 11/04/2016   DM2 (diabetes mellitus, type 2) (HCC) 04/29/2021   Dyshidrotic eczema 04/29/2021   Dysrhythmia    afib   Esophageal abnormality 06/09/2022   Esophageal stricture 06/17/2022   Essential hypertension 11/04/2016   Facet arthritis of lumbar region 03/08/2018   Facet joint disease 11/17/2018   Last Assessment & Plan:  Formatting of this note might be different from the original. See spinal stenosis plan   Foraminal stenosis of lumbar region 03/08/2018   Heart murmur    History of knee replacement, total, bilateral 11/17/2018   Last Assessment & Plan:  Formatting of this note might be different from the original. Have recommended to her several times if any further concerns in regards to the pain status post arthroplasty, she is to follow up with her surgeon.   History of pulmonary embolism 11/28/2021   History of pulmonary embolus (PE) 05/28/2022   Hypercalcemia 12/01/2022   Hyperlipidemia 12/01/2022   Hypertension    Hypertensive disorder 09/28/2018   Hyponatremia 03/22/2023   Increased  body mass index 09/28/2018   L1 vertebral fracture (HCC) 06/09/2022   Low back pain 11/30/2022   Lumbar radiculopathy 03/08/2018   Lumbar spondylosis 03/20/2019   Malnutrition of moderate degree 03/26/2023   Medication management 11/26/2022   Mixed dyslipidemia 11/04/2016   Morbid obesity (HCC) 11/04/2016   Muscle pain 04/29/2020   OSA (obstructive sleep apnea)    Osteoarthritis of left knee    Other dysphagia 06/17/2022   PAF (paroxysmal atrial fibrillation) (HCC) 04/30/2015   Pain management contract agreement 04/13/2019   Last Assessment & Plan:  Formatting of this note might be different from the original. Contract updated today.  UDS completed.  [place]  controlled substance registry reviewed and is consistent with her regimen.   Peripheral vascular disease 09/28/2018   Preop cardiovascular exam 05/01/2019   Pressure injury of skin 11/21/2021   Pulmonary emboli [facility])    Pulmonary embolism (HCC) 11/21/2021   Recurrent UTI 11/26/2022   Renal insufficiency 04/30/2015   S/P insertion of spinal cord stimulator 07/13/2019   Last Assessment & Plan:  Formatting of this note might be different from the original. About 5 months status post insertion of spinal cord stimulator with excellent relief.  She continues to keep close follow-up with her representative and has been working to appropriately  find best programs to get her adequate pain relief.   Smoker 09/28/2018   Spinal stenosis of lumbar region with neurogenic claudication 03/08/2018   Last Assessment & Plan:  Formatting of this note is different from the original. 79 year old female with chronic low back pain and bilateral, right greater than left, L4-5 radicular pain, and claudication. She has substantial amount of multifactorial degenerative thoracic and lumbar spine pathology.  After being deemed an inappropriate open surgical candidate, and failing to respond to multiple in   Spondylolisthesis 03/20/2019   Spondylosis of  lumbosacral region without myelopathy or radiculopathy 01/30/2019   Last Assessment & Plan:  Formatting of this note might be different from the original. See spinal stenosis plan   Tinea unguium 02/03/2022   Type 2 diabetes mellitus (HCC) 09/28/2018   Urinary tract infection without hematuria 11/17/2022    Past Surgical History:  Procedure Laterality Date   ABDOMINAL HYSTERECTOMY     BACK SURGERY     BIOPSY  06/18/2022   Procedure: BIOPSY;  Surgeon: Wilhelmenia Aloha Raddle., MD;  Location: [facility] ENDOSCOPY;  Service: Gastroenterology;;   ESOPHAGEAL DILATION  03/24/2023   Procedure: ESOPHAGEAL DILATION;  Surgeon: Albertus Gordy HERO, MD;  Location: [facility] ENDOSCOPY;  Service: Gastroenterology;;   ESOPHAGOGASTRODUODENOSCOPY (EGD) WITH PROPOFOL  N/A 06/18/2022   Procedure: ESOPHAGOGASTRODUODENOSCOPY (EGD) WITH PROPOFOL ;  Surgeon: Wilhelmenia Aloha Raddle., MD;  Location: [facility] ENDOSCOPY;  Service: Gastroenterology;  Laterality: N/A;   ESOPHAGOGASTRODUODENOSCOPY (EGD) WITH PROPOFOL  N/A 03/24/2023   Procedure: ESOPHAGOGASTRODUODENOSCOPY (EGD) WITH PROPOFOL ;  Surgeon: Albertus Gordy HERO, MD;  Location: [facility] ENDOSCOPY;  Service: Gastroenterology;  Laterality: N/A;   EYE SURGERY     bilateral cataracts   IR ANGIOGRAM PULMONARY BILATERAL SELECTIVE  11/21/2021   IR ANGIOGRAM SELECTIVE EACH ADDITIONAL VESSEL  11/21/2021   IR ANGIOGRAM SELECTIVE EACH ADDITIONAL VESSEL  11/21/2021   IR THROMBECT PRIM MECH INIT (INCLU) MOD SED  11/21/2021   IR US  GUIDE VASC ACCESS RIGHT  11/21/2021   JOINT REPLACEMENT Bilateral    knees   RADIOLOGY WITH ANESTHESIA N/A 11/21/2021   Procedure: IR WITH ANESTHESIA;  Surgeon: Radiologist, Medication, MD;  Location: [facility] OR;  Service: Radiology;  Laterality: N/A;   SAVORY DILATION N/A 06/18/2022   Procedure: SAVORY DILATION;  Surgeon: Wilhelmenia Aloha Raddle., MD;  Location: [facility] ENDOSCOPY;  Service: Gastroenterology;  Laterality: N/A;   SPINAL CORD STIMULATOR INSERTION N/A 07/07/2019   Procedure: LUMBAR  SPINAL CORD STIMULATOR INSERTION;  Surgeon: Mindi Mt, MD;  Location: [facility] OR;  Service: Neurosurgery;  Laterality: N/A;   total left knee      Current Medications: Active Medications[1]   Allergies:   Patient has no known allergies.   Social History   Socioeconomic History   Marital status: Married    Spouse name: Not on file   Number of children: 2   Years of education: Not on file   Highest education level: Not on file  Occupational History   Not on file  Tobacco Use   Smoking status: Some Days    Current packs/day: 0.00    Average packs/day: 0.5 packs/day for 62.0 years (31.0 ttl pk-yrs)    Types: Cigarettes    Start date: 05/07/1957    Last attempt to quit: 05/08/2019    Years since quitting: 5.0    Passive exposure: Past   Smokeless tobacco: Never  Vaping Use   Vaping status: Never Used  Substance and Sexual Activity   Alcohol use: No   Drug use: No   Sexual activity: Not  Currently  Other Topics Concern   Not on file  Social History Narrative   Not on file   Social Drivers of Health   Tobacco Use: High Risk (05/10/2024)   Patient History    Smoking Tobacco Use: Some Days    Smokeless Tobacco Use: Never    Passive Exposure: Past  Financial Resource Strain: Low Risk (07/31/2022)   Overall Financial Resource Strain (CARDIA)    Difficulty of Paying Living Expenses: Not hard at all  Food Insecurity: Unknown (10/28/2023)   Received from [facility]   Epic    Within the past 12 months, you worried that your food would run out before you got money to buy more: Patient unable to answer    Within the past 12 months, the food you bought just didn't last and you didn't have money to get more. : Patient unable to answer  Transportation Needs: No Transportation Needs (09/11/2023)   Received from [facility]    In the past 12 months, has lack of reliable transportation kept you from medical appointments, meetings, work or from getting things needed for  daily living? : No  Physical Activity: Unknown (07/31/2022)   Exercise Vital Sign    Days of Exercise per Week: Not on file    Minutes of Exercise per Session: 0 min  Stress: Not on file  Social Connections: Not on file  Depression (PHQ2-9): Low Risk (07/31/2022)   Depression (PHQ2-9)    PHQ-2 Score: 0  Alcohol Screen: Low Risk (07/31/2022)   Alcohol Screen    Last Alcohol Screening Score (AUDIT): 1  Housing: Low Risk (10/28/2023)   Received from [facility]   Epic    What is your living situation today?: I have a steady place to live    Think about the place you live. Do you have problems with any of the following? Choose all that apply:: None/None on this list  Utilities: Unknown (10/28/2023)   Received from [facility]   Utilities    In the past 12 months has the electric, gas, oil, or water company threatened to shut off services in your home? : Patient unable to answer  Health Literacy: Not on file     Family History: The patient's family history includes Diabetes in her mother.  ROS:   Please see the history of present illness.    All other systems reviewed and are negative.  EKGs/Labs/Other Studies Reviewed:    The following studies were reviewed today: .SABRA  Atrial fibrillation with well-controlled ventricular rate.    Recent Labs: No results found for requested labs within last 365 days.  Recent Lipid Panel No results found for: CHOL, TRIG, HDL, CHOLHDL, VLDL, LDLCALC, LDLDIRECT  Physical Exam:    VS:  BP (!) 110/56   Pulse 69   Ht 4' 8 (1.422 m)   Wt 101 lb 3.2 oz (45.9 kg)   SpO2 98%   BMI 22.69 kg/m     Wt Readings from Last 3 Encounters:  05/10/24 101 lb 3.2 oz (45.9 kg)  02/24/24 96 lb 9.6 oz (43.8 kg)  08/05/23 110 lb (49.9 kg)     GEN: Patient is in no acute distress HEENT: Normal NECK: No JVD; No carotid bruits LYMPHATICS: No lymphadenopathy CARDIAC: Hear sounds regular, 2/6 systolic murmur at the apex. RESPIRATORY:  Clear  to auscultation without rales, wheezing or rhonchi  ABDOMEN: Soft, non-tender, non-distended MUSCULOSKELETAL:  No edema; No deformity  SKIN: Warm and dry NEUROLOGIC:  Alert and oriented x 3 PSYCHIATRIC:  Normal affect   Signed, Jennifer JONELLE Crape, MD  05/10/2024 2:16 PM    [facility]      [1]  Current Meds  Medication Sig   albuterol  (VENTOLIN  HFA) 108 (90 Base) MCG/ACT inhaler Inhale 1-2 puffs into the lungs every 6 (six) hours as needed for wheezing or shortness of breath.   aspirin  EC 81 MG tablet Take 81 mg by mouth daily. Swallow whole.   bethanechol  (URECHOLINE ) 50 MG tablet Take 50 mg by mouth 2 (two) times daily.   Coenzyme Q10 200 MG capsule Take 200 mg by mouth daily.   docusate sodium  (COLACE) 100 MG capsule Take 100 mg by mouth 2 (two) times daily as needed for mild constipation or moderate constipation.   ELIQUIS  5 MG TABS tablet TAKE 1 TABLET BY MOUTH TWICE A DAY   fluticasone  furoate-vilanterol (BREO ELLIPTA ) 100-25 MCG/ACT AEPB Inhale 1 puff into the lungs daily.   furosemide  (LASIX ) 40 MG tablet Take 40 mg by mouth daily.   gabapentin  (NEURONTIN ) 600 MG tablet Take 600 mg by mouth at bedtime.   lidocaine  (LIDODERM ) 5 % Place 1 patch onto the skin daily. Remove & Discard patch within 12 hours or as directed by MD   loratadine (CLARITIN) 10 MG tablet Take 10 mg by mouth daily.   lovastatin (MEVACOR) 40 MG tablet Take 40 mg by mouth at bedtime.   methocarbamol  (ROBAXIN ) 500 MG tablet Take 250 mg by mouth 3 (three) times daily.   Multiple Vitamins-Minerals (CENTRUM SILVER 50+WOMEN) TABS Take 1 tablet by mouth daily with breakfast.   nitroGLYCERIN  (NITROSTAT ) 0.4 MG SL tablet Place 0.4 mg under the tongue every 5 (five) minutes as needed for chest pain.   nystatin  cream (MYCOSTATIN ) Apply 1 Application topically 2 (two) times daily.   Oxycodone  HCl 10 MG TABS Take 10 mg by mouth every 6 (six) hours as needed (pain).   pantoprazole  (PROTONIX ) 40 MG  tablet TAKE 1 TABLET BY MOUTH EVERY DAY   potassium chloride SA (KLOR-CON M) 20 MEQ tablet Take 20 mEq by mouth daily.   spironolactone  (ALDACTONE ) 25 MG tablet Take 0.5 tablets (12.5 mg total) by mouth daily.   tamsulosin  (FLOMAX ) 0.4 MG CAPS capsule Take 0.4 mg by mouth daily.   triamcinolone  ointment (KENALOG ) 0.5 % Apply 1 Application topically at bedtime.   Turmeric (QC TUMERIC COMPLEX PO) Take 1 tablet by mouth daily.   [DISCONTINUED] RYBELSUS  3 MG TABS Take 1 tablet by mouth daily.   "
